# Patient Record
Sex: Female | Born: 1955 | Race: White | Hispanic: No | Marital: Married | Smoking: Former smoker
Health system: Southern US, Community
[De-identification: ages and names within clinical notes are randomized; demographics above are authoritative.]

## PROBLEM LIST (undated history)

## (undated) DIAGNOSIS — R112 Nausea with vomiting, unspecified: Secondary | ICD-10-CM

## (undated) DIAGNOSIS — J449 Chronic obstructive pulmonary disease, unspecified: Secondary | ICD-10-CM

## (undated) DIAGNOSIS — R7303 Prediabetes: Secondary | ICD-10-CM

## (undated) DIAGNOSIS — Z9889 Other specified postprocedural states: Secondary | ICD-10-CM

## (undated) DIAGNOSIS — R002 Palpitations: Secondary | ICD-10-CM

## (undated) DIAGNOSIS — K219 Gastro-esophageal reflux disease without esophagitis: Secondary | ICD-10-CM

## (undated) DIAGNOSIS — K760 Fatty (change of) liver, not elsewhere classified: Secondary | ICD-10-CM

## (undated) DIAGNOSIS — IMO0001 Reserved for inherently not codable concepts without codable children: Secondary | ICD-10-CM

## (undated) DIAGNOSIS — IMO0002 Reserved for concepts with insufficient information to code with codable children: Secondary | ICD-10-CM

## (undated) DIAGNOSIS — U071 COVID-19: Secondary | ICD-10-CM

## (undated) DIAGNOSIS — N390 Urinary tract infection, site not specified: Secondary | ICD-10-CM

## (undated) DIAGNOSIS — T7840XA Allergy, unspecified, initial encounter: Secondary | ICD-10-CM

## (undated) DIAGNOSIS — M170 Bilateral primary osteoarthritis of knee: Secondary | ICD-10-CM

## (undated) DIAGNOSIS — R0902 Hypoxemia: Secondary | ICD-10-CM

## (undated) DIAGNOSIS — R3129 Other microscopic hematuria: Secondary | ICD-10-CM

## (undated) DIAGNOSIS — M545 Low back pain, unspecified: Secondary | ICD-10-CM

## (undated) DIAGNOSIS — T8859XA Other complications of anesthesia, initial encounter: Secondary | ICD-10-CM

## (undated) DIAGNOSIS — K2970 Gastritis, unspecified, without bleeding: Secondary | ICD-10-CM

## (undated) DIAGNOSIS — R0789 Other chest pain: Secondary | ICD-10-CM

## (undated) DIAGNOSIS — F419 Anxiety disorder, unspecified: Secondary | ICD-10-CM

## (undated) DIAGNOSIS — R748 Abnormal levels of other serum enzymes: Secondary | ICD-10-CM

## (undated) DIAGNOSIS — F17201 Nicotine dependence, unspecified, in remission: Secondary | ICD-10-CM

## (undated) DIAGNOSIS — E119 Type 2 diabetes mellitus without complications: Secondary | ICD-10-CM

## (undated) DIAGNOSIS — E785 Hyperlipidemia, unspecified: Secondary | ICD-10-CM

## (undated) DIAGNOSIS — I10 Essential (primary) hypertension: Secondary | ICD-10-CM

## (undated) DIAGNOSIS — R195 Other fecal abnormalities: Secondary | ICD-10-CM

## (undated) DIAGNOSIS — G8929 Other chronic pain: Secondary | ICD-10-CM

## (undated) DIAGNOSIS — I2510 Atherosclerotic heart disease of native coronary artery without angina pectoris: Secondary | ICD-10-CM

## (undated) DIAGNOSIS — Z8719 Personal history of other diseases of the digestive system: Secondary | ICD-10-CM

## (undated) DIAGNOSIS — I350 Nonrheumatic aortic (valve) stenosis: Secondary | ICD-10-CM

## (undated) DIAGNOSIS — I4510 Unspecified right bundle-branch block: Secondary | ICD-10-CM

## (undated) HISTORY — DX: Hyperlipidemia, unspecified: E78.5

## (undated) HISTORY — PX: TONSILLECTOMY: SUR1361

## (undated) HISTORY — DX: Hypoxemia: R09.02

## (undated) HISTORY — DX: Allergy, unspecified, initial encounter: T78.40XA

## (undated) HISTORY — PX: OOPHORECTOMY: SHX86

## (undated) HISTORY — DX: Reserved for concepts with insufficient information to code with codable children: IMO0002

## (undated) HISTORY — DX: Essential (primary) hypertension: I10

## (undated) HISTORY — DX: Personal history of other diseases of the digestive system: Z87.19

## (undated) HISTORY — DX: Bilateral primary osteoarthritis of knee: M17.0

## (undated) HISTORY — DX: Abnormal levels of other serum enzymes: R74.8

## (undated) HISTORY — DX: Other chest pain: R07.89

## (undated) HISTORY — PX: SPINE SURGERY: SHX786

## (undated) HISTORY — DX: Other chronic pain: G89.29

## (undated) HISTORY — DX: Anxiety disorder, unspecified: F41.9

## (undated) HISTORY — DX: Urinary tract infection, site not specified: N39.0

## (undated) HISTORY — DX: Other fecal abnormalities: R19.5

## (undated) HISTORY — DX: Low back pain, unspecified: M54.50

## (undated) HISTORY — DX: Gastritis, unspecified, without bleeding: K29.70

## (undated) HISTORY — DX: Other microscopic hematuria: R31.29

## (undated) HISTORY — DX: Nicotine dependence, unspecified, in remission: F17.201

## (undated) HISTORY — DX: Prediabetes: R73.03

## (undated) HISTORY — DX: Reserved for inherently not codable concepts without codable children: IMO0001

## (undated) HISTORY — PX: TUBAL LIGATION: SHX77

## (undated) HISTORY — PX: KNEE ARTHROSCOPY: SUR90

## (undated) HISTORY — PX: TONSILLECTOMY: SHX5217

## (undated) HISTORY — DX: Low back pain: M54.5

## (undated) HISTORY — DX: Palpitations: R00.2

## (undated) HISTORY — DX: Type 2 diabetes mellitus without complications: E11.9

## (undated) HISTORY — DX: Fatty (change of) liver, not elsewhere classified: K76.0

## (undated) HISTORY — PX: JOINT REPLACEMENT: SHX530

---
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
## 1982-11-05 HISTORY — PX: APPENDECTOMY: SHX54

## 1991-11-06 HISTORY — PX: LUMBAR SPINE SURGERY: SHX701

## 1995-11-06 HISTORY — PX: ABDOMINAL HYSTERECTOMY: SHX81

## 2004-01-07 ENCOUNTER — Ambulatory Visit (HOSPITAL_COMMUNITY): Admission: RE | Admit: 2004-01-07 | Discharge: 2004-01-07 | Payer: Self-pay | Admitting: Preventative Medicine

## 2004-02-02 ENCOUNTER — Encounter (HOSPITAL_COMMUNITY): Admission: RE | Admit: 2004-02-02 | Discharge: 2004-03-03 | Payer: Self-pay | Admitting: Unknown Physician Specialty

## 2004-04-20 ENCOUNTER — Ambulatory Visit (HOSPITAL_COMMUNITY): Admission: RE | Admit: 2004-04-20 | Discharge: 2004-04-20 | Payer: Self-pay | Admitting: Preventative Medicine

## 2004-11-05 DIAGNOSIS — R002 Palpitations: Secondary | ICD-10-CM

## 2004-11-05 HISTORY — PX: CARDIOVASCULAR STRESS TEST: SHX262

## 2004-11-05 HISTORY — DX: Palpitations: R00.2

## 2004-11-05 HISTORY — PX: TRANSTHORACIC ECHOCARDIOGRAM: SHX275

## 2005-01-26 ENCOUNTER — Encounter: Payer: Self-pay | Admitting: Family Medicine

## 2005-05-25 ENCOUNTER — Encounter: Payer: Self-pay | Admitting: Family Medicine

## 2005-05-25 ENCOUNTER — Ambulatory Visit (HOSPITAL_COMMUNITY): Admission: RE | Admit: 2005-05-25 | Discharge: 2005-05-25 | Payer: Self-pay | Admitting: Family Medicine

## 2005-05-29 ENCOUNTER — Ambulatory Visit (HOSPITAL_COMMUNITY): Admission: RE | Admit: 2005-05-29 | Discharge: 2005-05-30 | Payer: Self-pay | Admitting: Family Medicine

## 2005-05-30 ENCOUNTER — Encounter: Payer: Self-pay | Admitting: Family Medicine

## 2005-05-31 ENCOUNTER — Ambulatory Visit: Payer: Self-pay | Admitting: Cardiology

## 2005-07-22 ENCOUNTER — Emergency Department (HOSPITAL_COMMUNITY): Admission: EM | Admit: 2005-07-22 | Discharge: 2005-07-22 | Payer: Self-pay | Admitting: Emergency Medicine

## 2005-10-09 ENCOUNTER — Encounter: Payer: Self-pay | Admitting: Family Medicine

## 2005-10-09 HISTORY — PX: CARDIAC CATHETERIZATION: SHX172

## 2006-03-28 ENCOUNTER — Encounter: Payer: Self-pay | Admitting: Family Medicine

## 2006-03-28 ENCOUNTER — Ambulatory Visit (HOSPITAL_COMMUNITY): Admission: RE | Admit: 2006-03-28 | Discharge: 2006-03-28 | Payer: Self-pay | Admitting: Family Medicine

## 2006-05-14 ENCOUNTER — Encounter: Payer: Self-pay | Admitting: Family Medicine

## 2006-05-20 ENCOUNTER — Encounter: Payer: Self-pay | Admitting: Family Medicine

## 2006-09-03 ENCOUNTER — Encounter: Payer: Self-pay | Admitting: Family Medicine

## 2006-09-03 ENCOUNTER — Ambulatory Visit (HOSPITAL_COMMUNITY): Admission: RE | Admit: 2006-09-03 | Discharge: 2006-09-03 | Payer: Self-pay | Admitting: Urology

## 2006-09-30 ENCOUNTER — Encounter: Payer: Self-pay | Admitting: Family Medicine

## 2007-03-05 ENCOUNTER — Ambulatory Visit (HOSPITAL_COMMUNITY): Admission: RE | Admit: 2007-03-05 | Discharge: 2007-03-05 | Payer: Self-pay | Admitting: General Surgery

## 2007-03-05 ENCOUNTER — Encounter (INDEPENDENT_AMBULATORY_CARE_PROVIDER_SITE_OTHER): Payer: Self-pay | Admitting: Specialist

## 2008-09-09 ENCOUNTER — Ambulatory Visit (HOSPITAL_COMMUNITY): Admission: RE | Admit: 2008-09-09 | Discharge: 2008-09-09 | Payer: Self-pay | Admitting: Family Medicine

## 2008-09-15 ENCOUNTER — Ambulatory Visit (HOSPITAL_COMMUNITY): Admission: RE | Admit: 2008-09-15 | Discharge: 2008-09-15 | Payer: Self-pay | Admitting: Family Medicine

## 2008-09-15 ENCOUNTER — Encounter: Payer: Self-pay | Admitting: Family Medicine

## 2008-11-05 HISTORY — PX: OTHER SURGICAL HISTORY: SHX169

## 2009-06-02 ENCOUNTER — Emergency Department (HOSPITAL_COMMUNITY): Admission: EM | Admit: 2009-06-02 | Discharge: 2009-06-02 | Payer: Self-pay | Admitting: Emergency Medicine

## 2009-06-07 ENCOUNTER — Ambulatory Visit (HOSPITAL_COMMUNITY): Admission: RE | Admit: 2009-06-07 | Discharge: 2009-06-07 | Payer: Self-pay | Admitting: Family Medicine

## 2009-08-10 ENCOUNTER — Encounter (INDEPENDENT_AMBULATORY_CARE_PROVIDER_SITE_OTHER): Payer: Self-pay | Admitting: General Surgery

## 2009-08-10 ENCOUNTER — Ambulatory Visit (HOSPITAL_COMMUNITY): Admission: RE | Admit: 2009-08-10 | Discharge: 2009-08-10 | Payer: Self-pay | Admitting: General Surgery

## 2009-10-04 ENCOUNTER — Encounter: Payer: Self-pay | Admitting: Family Medicine

## 2009-10-13 ENCOUNTER — Encounter: Payer: Self-pay | Admitting: Family Medicine

## 2009-11-14 ENCOUNTER — Encounter: Payer: Self-pay | Admitting: Family Medicine

## 2009-11-25 ENCOUNTER — Encounter: Payer: Self-pay | Admitting: Family Medicine

## 2010-10-23 ENCOUNTER — Encounter: Payer: Self-pay | Admitting: Family Medicine

## 2010-11-09 ENCOUNTER — Encounter: Payer: Self-pay | Admitting: Family Medicine

## 2010-11-09 ENCOUNTER — Ambulatory Visit
Admission: RE | Admit: 2010-11-09 | Discharge: 2010-11-09 | Payer: Self-pay | Source: Home / Self Care | Attending: Family Medicine | Admitting: Family Medicine

## 2010-11-09 ENCOUNTER — Telehealth: Payer: Self-pay | Admitting: Family Medicine

## 2010-11-09 DIAGNOSIS — F17200 Nicotine dependence, unspecified, uncomplicated: Secondary | ICD-10-CM | POA: Insufficient documentation

## 2010-11-09 DIAGNOSIS — R7303 Prediabetes: Secondary | ICD-10-CM | POA: Insufficient documentation

## 2010-11-09 DIAGNOSIS — E785 Hyperlipidemia, unspecified: Secondary | ICD-10-CM | POA: Insufficient documentation

## 2010-11-09 DIAGNOSIS — Z87448 Personal history of other diseases of urinary system: Secondary | ICD-10-CM | POA: Insufficient documentation

## 2010-11-09 DIAGNOSIS — M545 Low back pain: Secondary | ICD-10-CM | POA: Insufficient documentation

## 2010-11-10 ENCOUNTER — Encounter: Payer: Self-pay | Admitting: Family Medicine

## 2010-11-14 ENCOUNTER — Encounter: Payer: Self-pay | Admitting: Family Medicine

## 2010-12-07 NOTE — Miscellaneous (Signed)
  Clinical Lists Changes  Medications: Added new medication of CYCLOBENZAPRINE HCL 10 MG TABS (CYCLOBENZAPRINE HCL) 1 tab by mouth q8h as needed for muscle spasms - Signed Rx of CYCLOBENZAPRINE HCL 10 MG TABS (CYCLOBENZAPRINE HCL) 1 tab by mouth q8h as needed for muscle spasms;  #30 x 3;  Signed;  Entered by: Michell Heinrich M.D.;  Authorized by: Michell Heinrich M.D.;  Method used: Electronically to [facility] [address] [place], [place], [place]  [address], Ph: [phone], Fax: [phone]    Prescriptions: CYCLOBENZAPRINE HCL 10 MG TABS (CYCLOBENZAPRINE HCL) 1 tab by mouth q8h as needed for muscle spasms  #30 x 3   Entered and Authorized by:   Michell Heinrich M.D.   Signed by:   Michell Heinrich M.D. on 11/09/2010   Method used:   Electronically to        [facility] (retail)       [address]       [place], [place]  [address]       Ph: [phone]       Fax: [phone]   RxID:   [id number]

## 2010-12-07 NOTE — Letter (Signed)
[facility]   Imported By: Lester [place] 11/15/2010 07:08:18  _____________________________________________________________________  External Attachment:    Type:   Image     Comment:   External Document

## 2010-12-07 NOTE — Letter (Signed)
Summary: Jill Side MD  T Casandra Doffing MD   Imported By: Lester [place] 11/15/2010 09:30:11  _____________________________________________________________________  External Attachment:    Type:   Image     Comment:   External Document

## 2010-12-07 NOTE — Cardiovascular Report (Signed)
Summary: [facility]  [facility]   Imported By: Lanelle Bal 11/20/2010 12:30:32  _____________________________________________________________________  External Attachment:    Type:   Image     Comment:   External Document

## 2010-12-07 NOTE — Assessment & Plan Note (Signed)
Summary: cpx/refill of meds./vfw   Vital Signs:  Patient profile:   55 year old female Menstrual status:  hysterectomy Height:      65 inches Weight:      169.75 pounds BMI:     28.35 O2 Sat:      96 % on Room air Pulse rate:   69 / minute BP sitting:   116 / 79  (right arm) Cuff size:   regular  Vitals Entered By: Francee Piccolo CMA Duncan Dull) (November 09, 2010 8:32 AM)  O2 Flow:  Room air CC: Establish care/refill meds//SP     Menstrual Status hysterectomy   History of Present Illness: 55 y/o WF familiar to me from my prior practice in [place], [place]--first visit here with me at [facility]. Doing okay overall, had another L-spine surgery 02/2010 for spinal stenosis and this resolved her leg pains.  She had stabilization procedure on L spine after this, still has chronic LBP, pain level 4-8/10 avg, consistent with the pain level that she had been controlling well with about 3 vicodin 5/500 per day prior to surgery.  Has been out of pain meds x 3d. Has been out of all her other meds x 3 mo or so b/c of lack of primary care f/u. Has diet controlled DM 2, once daily-bid glucose checks are good (fasting avg 100, 2HPP avg 140), she notes better numbers since having to adjust diet and activity/rehab after surgery 02/2010.  Last mammogram was > 1 yr ago, has never had abnormal mammo. Has not had screening colonoscopy yet. No flu shot yet this season. Still smoking a couple of cigarettes per day most days.  Reviewed med list below: pt NOT taking citalopram or ventolin anymore.  Otherwise the list is accurate.   Preventive Screening-Counseling & Management  Alcohol-Tobacco     Alcohol drinks/day: 0     Smoking Status: current  Caffeine-Diet-Exercise     Does Patient Exercise: yes      Drug Use:  no.    Current Medications (verified): 1)  Ventolin Hfa 108 (90 Base) Mcg/act Aers (Albuterol Sulfate) .Marland Kitchen.. 1 Puff Every 4 Hours As Needed 2)  Fenofibrate 160 Mg Tabs (Fenofibrate) ....  Take 1 Tablet By Mouth Once A Day 3)  Citalopram Hydrobromide 40 Mg Tabs (Citalopram Hydrobromide) .... Take 1 Tablet By Mouth Once A Day 4)  Nexium 40 Mg Cpdr (Esomeprazole Magnesium) .... Take 1 Capsule By Mouth Once A Day 5)  Hydrocodone-Acetaminophen 5-500 Mg Tabs (Hydrocodone-Acetaminophen) .... Take 1 Tab By Mouth Every 4-6 Hours As Needed Pain 6)  Lescol Xl 80 Mg Xr24h-Tab (Fluvastatin Sodium) .... Take 1 Tablet By Mouth Once A Day  Allergies (verified): 1)  ! Asa 2)  ! Compazine 3)  ! Pcn 4)  ! Amitriptyline Hcl 5)  Beta Blockers 6)  Nsaids 7)  Sulfa  Past History:  Past Medical History: Chronic LBP DDD/spinal stenosis Hyperlipidemia-mixed Recurrent UTI Microscopic hematuria Tobacco dependence Atypical CP 2007: cath normal  Past Surgical History: Lumbar spine surgery 2003, 2005, 2011 (stabilization done)--Dr. Derian Hysterectomy 1997 (DUB) Right oopherectomy-cyst Tonsillectomy -distant past  Family History: No malignancy, CAD, or DM.  Social History: Married. Not working currently. Tobacco: avg 3 cigs/day, history of much more x years. Alcohol use-no Drug use-no Regular exercise-yes, as allowed by low back pain. Smoking Status:  current Drug Use:  no Does Patient Exercise:  yes  Review of Systems  The patient denies anorexia, fever, weight loss, weight gain, vision loss, decreased hearing, hoarseness,  chest pain, syncope, dyspnea on exertion, peripheral edema, prolonged cough, headaches, hemoptysis, abdominal pain, melena, hematochezia, severe indigestion/heartburn, hematuria, incontinence, genital sores, muscle weakness, suspicious skin lesions, transient blindness, difficulty walking, depression, unusual weight change, abnormal bleeding, enlarged lymph nodes, angioedema, and breast masses.    Physical Exam  General:  VS: noted, all normal. Gen: Alert, well appearing, oriented x 4.  Wearing a firm TLSO. HEENT: Scalp without lesions or hair loss.  Ears:  EACs clear, normal epithelium.  TMs with good light reflex and landmarks bilaterally.  Eyes: no injection, icteris, swelling, or exudate.  EOMI, PERRLA. Nose: no drainage or turbinate edema/swelling.  No inection or focal lesion.  Mouth: lips without lesion/swelling.  Oral mucosa pink and moist.  Dentition intact and without obvious caries or gingival swelling.  Oropharynx without erythema, exudate, or swelling.  Neck: supple.  No lymphadenopathy, thyromegaly, or mass. Chest: symmetric expansion, with nonlabored respirations.  Clear and equal breath sounds in all lung fields.   CV: RRR, no m/r/g.  Peripheral pulses 2+/symmetric. EXT: no clubbing, cyanosis, or edema.     Impression & Recommendations:  Problem # 1:  BACK PAIN, LUMBAR, CHRONIC (ICD-724.2) Assessment Improved Will resume her prior pain mgmt regimen.  She'll continue f/u with her surgeon in [place], Dr. Clotilde Dieter. After brace is off and things are stable for her, we'll pursue her screening colonoscopy referral and screening mammogram. I encouraged her to get her flu vaccine ASAP.  Her updated medication list for this problem includes:    Hydrocodone-acetaminophen 5-500 Mg Tabs (Hydrocodone-acetaminophen) .Marland Kitchen... Take 1 tab by mouth every 4-6 hours as needed pain  Problem # 2:  DIAB W/O COMP TYPE II/UNS NOT STATED UNCNTRL (ICD-250.00) Assessment: Improved Good control per home gluc monitoring.  Continue diet/exercise and once daily to once every other day glucose checks. We'll check urine microalbumin screen next f/u in 6mo.  Will obtain old records to see if D.R. screening is due (I suspect it is) and we'll set this up as necessary.  Orders: T-Lipid Profile [id number]) T-Comprehensive Metabolic Panel [id number]) T- Hemoglobin A1C ([id number])  Problem # 3:  HYPERLIPIDEMIA (ICD-272.4) Assessment: Unchanged Restart meds, check blood work.  Her updated medication list for this problem includes:    Fenofibrate 160 Mg Tabs  (Fenofibrate) .Marland Kitchen... Take 1 tablet by mouth once a day    Lescol Xl 80 Mg Xr24h-tab (Fluvastatin sodium) .Marland Kitchen... Take 1 tablet by mouth once a day  Orders: T-Lipid Profile ([id number]) T-Comprehensive Metabolic Panel [id number]) T- Hemoglobin A1C ([id number])  Problem # 4:  HEMATURIA, MICROSCOPIC, HX OF (ICD-V13.09) Assessment: Unchanged Obtain former urologist's records....she desires referral to different urologist for f/u of this problem. W/u in the past, including cystoscopy and u/s were negative/normal.   It seems her recurrent UTI problem is doing fine off of prophylactic abx as well. We'll check her urine here periodically----new urology referral if she continues to have microhematuria.  Problem # 5:  TOBACCO ABUSE (ICD-305.1) Assessment: Improved Encouraged complete cessation.  Complete Medication List: 1)  Fenofibrate 160 Mg Tabs (Fenofibrate) .... Take 1 tablet by mouth once a day 2)  Nexium 40 Mg Cpdr (Esomeprazole magnesium) .... Take 1 capsule by mouth once a day 3)  Hydrocodone-acetaminophen 5-500 Mg Tabs (Hydrocodone-acetaminophen) .... Take 1 tab by mouth every 4-6 hours as needed pain 4)  Lescol Xl 80 Mg Xr24h-tab (Fluvastatin sodium) .... Take 1 tablet by mouth once a day  Patient Instructions: 1)  Sign release of records authorization (need records from Dr. Clotilde Dieter in  [place] and Dr. Laurance Flatten at [facility]. 2)  Get your flu shot ASAP. 3)  Take your orders for lab work ASAP. 4)  Make f/u appt for 3 months. Prescriptions: HYDROCODONE-ACETAMINOPHEN 5-500 MG TABS (HYDROCODONE-ACETAMINOPHEN) take 1 tab by mouth every 4-6 hours as needed pain  #90 x 3   Entered and Authorized by:   Michell Heinrich M.D.   Signed by:   Michell Heinrich M.D. on 11/09/2010   Method used:   Print then Give to Patient   RxID:   [id number] LESCOL XL 80 MG XR24H-TAB (FLUVASTATIN SODIUM) Take 1 tablet by mouth once a day  #30 x 6   Entered and Authorized by:    Michell Heinrich M.D.   Signed by:   Michell Heinrich M.D. on 11/09/2010   Method used:   Electronically to        [facility] (retail)       [address]       [place], [place]  [address]       Ph: [phone]       Fax: [phone]   RxID:   [id number] NEXIUM 40 MG CPDR (ESOMEPRAZOLE MAGNESIUM) Take 1 capsule by mouth once a day  #30 x 6   Entered and Authorized by:   Michell Heinrich M.D.   Signed by:   Michell Heinrich M.D. on 11/09/2010   Method used:   Electronically to        [facility] (retail)       [address]       [place], [place]  [address]       Ph: [phone]       Fax: [phone]   RxID:   [id number] FENOFIBRATE 160 MG TABS (FENOFIBRATE) Take 1 tablet by mouth once a day  #30 x 6   Entered and Authorized by:   Michell Heinrich M.D.   Signed by:   Michell Heinrich M.D. on 11/09/2010   Method used:   Electronically to        [facility] (retail)       [address]       [place], [place]  [address]       Ph: [phone]       Fax: [phone]   RxID:   [id number]    Orders Added: 1)  T-Lipid Profile [id number] 2)  T-Comprehensive Metabolic Panel [[id number]] 3)  T- Hemoglobin A1C [[id number]] 4)  Est. Patient Level IV [00938]  Appended Document: cpx/refill of meds./vfw Forgot to rx pt muscle relaxer for her low back pain/muscle spasms ---I sent eRx for cyclobenzaprine 10mg , 1 tab q8h as needed, #30, RFx 3 and we'll notify pt.

## 2010-12-07 NOTE — Progress Notes (Signed)
  Phone Note Other Incoming   Summary of Call: Please call and notify pt that I forgot to rx her the muscle relaxer we discussed at today's visit.  I just sent eRx for it now.  Thanks Initial call taken by: Michell Heinrich M.D.,  November 09, 2010 2:33 PM  Follow-up for Phone Call        I spoke to pt and notified her that med was at pharmacy.  Pt states she recieved flu shot yesterday at pharmacy and had temp of 104 last night.  Pt had no other associated symptoms, and has not had any fever since 7:30 am today.  Advised pt to monitor temp and if any other symptoms occur to call our office.  Pt voices understanding and is agreeable. Follow-up by: Francee Piccolo CMA Duncan Dull),  November 10, 2010 11:29 AM

## 2010-12-07 NOTE — Letter (Signed)
Summary: Jill Side MD  T Casandra Doffing MD   Imported By: Lester [place] 11/15/2010 09:29:12  _____________________________________________________________________  External Attachment:    Type:   Image     Comment:   External Document

## 2010-12-07 NOTE — Letter (Signed)
Summary: Notes for 06-08-09 thru 10-04-09/[facility]  Notes for 06-08-09 thru 10-04-09/[facility]   Imported By: Lanelle Bal 11/20/2010 12:24:56  _____________________________________________________________________  External Attachment:    Type:   Image     Comment:   External Document

## 2010-12-07 NOTE — Miscellaneous (Signed)
  Clinical Lists Changes  Observations: Added new observation of PAST SURG HX: Lumbar spine surgery 1993 (right iliac crest bone graft+metal instrumentation); 2005 (metal removal and decompression); 2011 (lumbar decompression 02/2010, then stabilization/instrumentation done 09/19/10: L2, L3, L5 left and  L2, L3, L4 right--pedical remnant L4 left embedded.  Left iliac crest bone graft)--Dr. Clotilde Dieter) Appendectomy 1984 Hysterectomy 1997 (DUB) Right oopherectomy-cyst Tonsillectomy age 13t Knee arthroscopy (right and left) Left wrist ganglion cyst excision 2010 (11/14/2010 13:51) Added new observation of PAST MED HX: Chronic LBP DDD/spinal stenosis Hyperlipidemia-mixed Remote hx of GI bleed (NSAIDs) Recurrent UTI Microscopic hematuria (full w/u unrevealing x 2) Tobacco dependence, with only occasional bronchitis illness (CT chest 2009 nl--for f/u of ?nodule on CXR). Atypical CP 2007: cardiolyte neg, echo nl, cath showed mild/nonobstructive LAD disease. Nuclear stress test negative, EF normal--preOp 09/2010 Palpitations: 48H Holter neg 2006 (11/14/2010 13:51)       Past History:  Past Medical History: Chronic LBP DDD/spinal stenosis Hyperlipidemia-mixed Remote hx of GI bleed (NSAIDs) Recurrent UTI Microscopic hematuria (full w/u unrevealing x 2) Tobacco dependence, with only occasional bronchitis illness (CT chest 2009 nl--for f/u of ?nodule on CXR). Atypical CP 2007: cardiolyte neg, echo nl, cath showed mild/nonobstructive LAD disease. Nuclear stress test negative, EF normal--preOp 09/2010 Palpitations: 48H Holter neg 2006  Past Surgical History: Lumbar spine surgery 1993 (right iliac crest bone graft+metal instrumentation); 2005 (metal removal and decompression); 2011 (lumbar decompression 02/2010, then stabilization/instrumentation done 09/19/10: L2, L3, L5 left and  L2, L3, L4 right--pedical remnant L4 left embedded.  Left iliac crest bone graft)--Dr. Clotilde Dieter) Appendectomy  1984 Hysterectomy 1997 (DUB) Right oopherectomy-cyst Tonsillectomy age 70t Knee arthroscopy (right and left) Left wrist ganglion cyst excision 2010   Allergies: 1)  ! Asa 2)  ! Compazine 3)  ! Pcn 4)  ! Amitriptyline Hcl 5)  Beta Blockers 6)  Nsaids 7)  Sulfa

## 2010-12-07 NOTE — Letter (Signed)
[facility]   Imported By: Lester [place] 11/15/2010 07:07:20  _____________________________________________________________________  External Attachment:    Type:   Image     Comment:   External Document

## 2010-12-07 NOTE — Letter (Signed)
Summary: [facility]   Imported By: Lanelle Bal 11/20/2010 12:14:54  _____________________________________________________________________  External Attachment:    Type:   Image     Comment:   External Document

## 2010-12-07 NOTE — Letter (Signed)
Summary: [facility]  [facility]   Imported By: Lanelle Bal 11/20/2010 12:31:34  _____________________________________________________________________  External Attachment:    Type:   Image     Comment:   External Document

## 2010-12-07 NOTE — Letter (Signed)
Summary: [facility]   Imported By: Lester [place] 11/15/2010 07:09:23  _____________________________________________________________________  External Attachment:    Type:   Image     Comment:   External Document

## 2010-12-07 NOTE — Miscellaneous (Signed)
  Clinical Lists Changes  Observations: Added new observation of SOCIAL HX: Married, two children. Disabled (back).  Worked for [employer] prior to disability. Tobacco: avg 3 cigs/day, history of much more x 35+ years. Alcohol use-no Drug use-no Regular exercise-walking, as allowed by low back pain.  (11/10/2010 13:41) Added new observation of PAST MED HX: Chronic LBP DDD/spinal stenosis Hyperlipidemia-mixed Remote hx of GI bleed (NSAIDs) Recurrent UTI Microscopic hematuria (w/u unrevealing) Tobacco dependence, with only occasional bronchitis illness. Atypical CP 2007: cath normal. Nuclear stress test negative, EF normal--preOp 09/2010 (11/10/2010 13:41) Added new observation of PAST SURG HX: Lumbar spine surgery 1993 (right iliac crest bone graft+metal instrumentation); 2005 (metal removal and decompression); 2011 (lumbar decompression 02/2010, then stabilization/instrumentation done 09/19/10: L2, L3, L5 left and  L2, L3, L4 right--pedical remnant L4 left embedded.  Left iliac crest bone graft)--Dr. Clotilde Dieter) Appendectomy 1984 Hysterectomy 1997 (DUB) Right oopherectomy-cyst Tonsillectomy age 26t Knee arthroscopy (right and left) (11/10/2010 13:41)       Past History:  Past Medical History: Chronic LBP DDD/spinal stenosis Hyperlipidemia-mixed Remote hx of GI bleed (NSAIDs) Recurrent UTI Microscopic hematuria (w/u unrevealing) Tobacco dependence, with only occasional bronchitis illness. Atypical CP 2007: cath normal. Nuclear stress test negative, EF normal--preOp 09/2010  Past Surgical History: Lumbar spine surgery 1993 (right iliac crest bone graft+metal instrumentation); 2005 (metal removal and decompression); 2011 (lumbar decompression 02/2010, then stabilization/instrumentation done 09/19/10: L2, L3, L5 left and  L2, L3, L4 right--pedical remnant L4 left embedded.  Left iliac crest bone graft)--Dr. Clotilde Dieter) Appendectomy 1984 Hysterectomy 1997 (DUB) Right  oopherectomy-cyst Tonsillectomy age 74t Knee arthroscopy (right and left)   Social History: Married, two children. Disabled (back).  Worked for [employer] prior to disability. Tobacco: avg 3 cigs/day, history of much more x 35+ years. Alcohol use-no Drug use-no Regular exercise-walking, as allowed by low back pain.   Allergies: 1)  ! Asa 2)  ! Compazine 3)  ! Pcn 4)  ! Amitriptyline Hcl 5)  Beta Blockers 6)  Nsaids 7)  Sulfa

## 2010-12-07 NOTE — Procedures (Signed)
Summary: [facility]  [facility]   Imported By: Lanelle Bal 11/20/2010 12:26:39  _____________________________________________________________________  External Attachment:    Type:   Image     Comment:   External Document

## 2010-12-07 NOTE — Letter (Signed)
Summary: [facility]   Imported By: Lanelle Bal 11/20/2010 12:16:09  _____________________________________________________________________  External Attachment:    Type:   Image     Comment:   External Document

## 2011-01-11 ENCOUNTER — Encounter: Payer: Self-pay | Admitting: Family Medicine

## 2011-01-28 ENCOUNTER — Encounter: Payer: Self-pay | Admitting: Family Medicine

## 2011-02-08 ENCOUNTER — Ambulatory Visit (INDEPENDENT_AMBULATORY_CARE_PROVIDER_SITE_OTHER): Payer: Medicare Other | Admitting: Family Medicine

## 2011-02-08 ENCOUNTER — Encounter: Payer: Self-pay | Admitting: Family Medicine

## 2011-02-08 DIAGNOSIS — E785 Hyperlipidemia, unspecified: Secondary | ICD-10-CM

## 2011-02-08 DIAGNOSIS — F17200 Nicotine dependence, unspecified, uncomplicated: Secondary | ICD-10-CM

## 2011-02-08 DIAGNOSIS — E119 Type 2 diabetes mellitus without complications: Secondary | ICD-10-CM

## 2011-02-08 DIAGNOSIS — M545 Low back pain: Secondary | ICD-10-CM

## 2011-02-08 LAB — DIFFERENTIAL
Eosinophils Relative: 3 % (ref 0–5)
Lymphocytes Relative: 37 % (ref 12–46)
Lymphs Abs: 2.7 10*3/uL (ref 0.7–4.0)
Monocytes Absolute: 0.5 10*3/uL (ref 0.1–1.0)
Monocytes Relative: 7 % (ref 3–12)

## 2011-02-08 LAB — BASIC METABOLIC PANEL
Chloride: 105 mEq/L (ref 96–112)
GFR calc non Af Amer: >60 mL/min (ref >60)
Potassium: 4.2 mEq/L (ref 3.5–5.1)
Sodium: 142 mEq/L (ref 135–145)

## 2011-02-08 LAB — CBC
HCT: 38.3 % (ref 36.0–46.0)
Hemoglobin: 13.3 g/dL (ref 12.0–15.0)
RBC: 4.13 MIL/uL (ref 3.87–5.11)
WBC: 7.3 10*3/uL (ref 4.0–10.5)

## 2011-02-08 NOTE — Assessment & Plan Note (Signed)
Keep f/u with orthopedist on 02/19/11.  No rx's given today.  She'll continue her current use of vicodin prn to remain functional.

## 2011-02-08 NOTE — Assessment & Plan Note (Signed)
Stable but not quite at goal.  Labs 11/10/10 showed LDL 113, HDL 35, and Trig 161. She is maxed out on lescol XL and fenofibrate.  We discussed possible addition of niacin or lovaza today but she shied away quickly when potential side effects were discussed. I encouraged her to get extremely strict with a low chol/low fat diet and hopefully she'll increase her activity level significantly when her back brace comes off. We'll recheck lipids at f/u in 30mo.

## 2011-02-08 NOTE — Progress Notes (Signed)
Subjective:    Patient ID: Brenda [name], female    DOB: Oct 11, 1956, 55 y.o.   MRN: [id number]  Diabetes She presents for her follow-up diabetic visit. She has type 2 diabetes mellitus. Her disease course has been stable. There are no hypoglycemic associated symptoms. Pertinent negatives for hypoglycemia include no headaches. There are no diabetic associated symptoms. Pertinent negatives for diabetes include no chest pain, no fatigue and no weakness. There are no diabetic complications. Current diabetic treatment includes diet. She is compliant with treatment all of the time. Her weight is stable. She is following a diabetic diet. She monitors blood glucose at home 1-2 x per day. Blood glucose monitoring compliance is excellent. There is no change in her home blood glucose trend. Her breakfast blood glucose is taken between 8-9 am. Her breakfast blood glucose range is generally 90-110 mg/dl.  Hyperlipidemia This is a chronic problem. The current episode started more than 1 year ago. Condition status: gradually improving but not at goal. Lipid results: Jan 2012 : improved. Exacerbating diseases include diabetes. Factors aggravating her hyperlipidemia include smoking. Pertinent negatives include no chest pain. Current antihyperlipidemic treatment includes fibric acid derivatives and statins. The current treatment provides moderate improvement of lipids. Compliance problems include adherence to exercise (b/c of chronic back pain and recent surgery and brace).   Unfortunately, she is still smoking but continues to gradually cut back. She is still in her lumbar support brace during daytime hours and has f/u with her orthopedist, Dr. Clotilde Dieter, 02/19/11 and hopes to get her brace off at that time.  She is still requiring the same amounts of vicodin--no recent increase or decrease in need. She knows she's due for urine microalbumin check, diabetic retinopathy screening, mammogram, and colon cancer screening  (colonoscopy), but says she wants to put these things off until her back pain issues are resolved--hopefully by the next f/u visit with me in 35mo.  Past Medical History  Diagnosis Date  . Chronic LBP   . DDD (degenerative disc disease)     spinal stenosis  . Hyperlipidemia     mixed  . History of GI bleed     NSAIDS  . Recurrent UTI   . Microscopic hematuria     full w/ u unrevealing X 2  . Tobacco dependence     w/ only occasional bronchitis illness (CT shest 2009 nl- for f/u of ? nodule on CXR)  . Atypical chest pain 2007    cardiolyte neg, echo nl, cath showed mild/nonobstructive LAD disease  . Normal nuclear stress test 11/11    negative, EF normal preop  . Palpitations 2006    48H holter neg   Past Surgical History  Procedure Date  . Lumbar spine surgery 1993    right iliac crest bone graft+metal instrumentation; 2005 metal removal and decompression, 2011 lumbar decompression 4-11, then stabilization/ instrumentation done 09-19-10: L2,L3, L5 left and L2 , L3, L4 right pedical remnant L4 left embedded. Left iliac crest bone graft-- Dr Clotilde Dieter  . Appendectomy 1984  . Abdominal hysterectomy 1997    DUB  . Right oopherectomy     cyst  . Tonsillectomy 55 yrs old  . Knee arthroscopy     right and left  . Left wrist ganglion cyst excision 2010   Outpatient Encounter Prescriptions as of 02/08/2011  Medication Sig Dispense Refill  . cyclobenzaprine (FLEXERIL) 10 MG tablet Take 10 mg by mouth every 8 (eight) hours as needed. For muscle spasms       .  esomeprazole (NEXIUM) 40 MG capsule Take 40 mg by mouth daily.        . fenofibrate 160 MG tablet Take 160 mg by mouth daily.        . fluvastatin XL (LESCOL XL) 80 MG 24 hr tablet Take 80 mg by mouth daily.        Marland Kitchen HYDROcodone-acetaminophen (VICODIN) 5-500 MG per tablet Take 1 tablet by mouth every 6 (six) hours as needed. 1 tab po q 4-6 hours prn pain        Allergies  Allergen Reactions  . Amitriptyline Hcl     REACTION:  increased heart rate  . Aspirin     REACTION: GI Bleed  . Beta Adrenergic Blockers     REACTION: decreased libido  . Nsaids     REACTION: GI Upset  . Penicillins     REACTION: swelling  . Prochlorperazine Edisylate     REACTION: Stroke like symptoms  . Sulfonamide Derivatives     REACTION: unsure rxn      Review of Systems  Constitutional: Negative for fever and fatigue.  HENT: Negative for congestion and sore throat.   Eyes: Negative for visual disturbance.  Respiratory: Negative for cough.   Cardiovascular: Negative for chest pain.  Gastrointestinal: Negative for nausea and abdominal pain.  Genitourinary: Negative for dysuria.  Musculoskeletal: Positive for back pain. Negative for joint swelling.  Skin: Negative for rash.  Neurological: Negative for weakness and headaches.  Hematological: Negative for adenopathy.       Objective:   Physical Exam Blood pressure 131/81, pulse 83, temperature 98.2 F (36.8 C), temperature source Oral, height 5\' 5"  (1.651 m), weight 172 lb 12.8 oz (78.382 kg), SpO2 93.00%. VS: noted, all normal. Gen: Alert, well appearing, oriented x 4. No further exam today.       Assessment & Plan:  DIAB W/O COMP TYPE II/UNS NOT STATED UNCNTRL Stable.  Last HbA1c was 5.4% 11/2010. Continue diabetic diet, increase exercise as her back problem resolves. Next visit she'll need routine urine microalbumin screen and referral to ophtho for D.R. Screening. Will do foot exam at next f/u as well.  HYPERLIPIDEMIA Stable but not quite at goal.  Labs 11/10/10 showed LDL 113, HDL 35, and Trig 161. She is maxed out on lescol XL and fenofibrate.  We discussed possible addition of niacin or lovaza today but she shied away quickly when potential side effects were discussed. I encouraged her to get extremely strict with a low chol/low fat diet and hopefully she'll increase her activity level significantly when her back brace comes off. We'll recheck lipids at f/u in  1mo.  TOBACCO ABUSE Encouraged cessation today.  BACK PAIN, LUMBAR, CHRONIC Keep f/u with orthopedist on 02/19/11.  No rx's given today.  She'll continue her current use of vicodin prn to remain functional.

## 2011-02-08 NOTE — Assessment & Plan Note (Signed)
Stable.  Last HbA1c was 5.4% 11/2010. Continue diabetic diet, increase exercise as her back problem resolves. Next visit she'll need routine urine microalbumin screen and referral to ophtho for D.R. Screening. Will do foot exam at next f/u as well.

## 2011-02-08 NOTE — Assessment & Plan Note (Signed)
Encouraged cessation today  

## 2011-02-11 LAB — COMPREHENSIVE METABOLIC PANEL
ALT: 20 U/L (ref 0–35)
AST: 21 U/L (ref 0–37)
Albumin: 3.6 g/dL (ref 3.5–5.2)
Alkaline Phosphatase: 56 U/L (ref 39–117)
BUN: 11 mg/dL (ref 6–23)
CO2: 26 mEq/L (ref 19–32)
Calcium: 8.8 mg/dL (ref 8.4–10.5)
Chloride: 112 mEq/L (ref 96–112)
Creatinine, Ser: 0.61 mg/dL (ref 0.4–1.2)
GFR calc Af Amer: >60 mL/min (ref >60)
GFR calc non Af Amer: >60 mL/min (ref >60)
Glucose, Bld: 115 mg/dL — ABNORMAL HIGH (ref 70–99)
Potassium: 3.7 mEq/L (ref 3.5–5.1)
Sodium: 143 mEq/L (ref 135–145)
Total Bilirubin: 0.2 mg/dL — ABNORMAL LOW (ref 0.3–1.2)
Total Protein: 6.3 g/dL (ref 6.0–8.3)

## 2011-02-11 LAB — DIFFERENTIAL
Basophils Absolute: 0 10*3/uL (ref 0.0–0.1)
Eosinophils Absolute: 0.1 10*3/uL (ref 0.0–0.7)
Lymphocytes Relative: 28 % (ref 12–46)
Lymphs Abs: 2.5 10*3/uL (ref 0.7–4.0)
Neutrophils Relative %: 64 % (ref 43–77)

## 2011-02-11 LAB — POCT CARDIAC MARKERS
Myoglobin, poc: 55.7 ng/mL (ref 12–200)
Troponin i, poc: <0.05 ng/mL (ref 0.00–0.09)

## 2011-02-11 LAB — URINALYSIS, ROUTINE W REFLEX MICROSCOPIC
Bilirubin Urine: NEGATIVE
Glucose, UA: NEGATIVE mg/dL
Ketones, ur: NEGATIVE mg/dL
Protein, ur: NEGATIVE mg/dL

## 2011-02-11 LAB — CBC
HCT: 36.5 % (ref 36.0–46.0)
Hemoglobin: 12.9 g/dL (ref 12.0–15.0)
MCHC: 35.3 g/dL (ref 30.0–36.0)
MCV: 92 fL (ref 78.0–100.0)
RBC: 3.97 MIL/uL (ref 3.87–5.11)

## 2011-02-20 ENCOUNTER — Encounter: Payer: Self-pay | Admitting: Family Medicine

## 2011-02-27 ENCOUNTER — Other Ambulatory Visit: Payer: Self-pay | Admitting: *Deleted

## 2011-02-27 DIAGNOSIS — M545 Low back pain: Secondary | ICD-10-CM

## 2011-02-27 MED ORDER — HYDROCODONE-ACETAMINOPHEN 5-500 MG PO TABS: 1.0000 | ORAL_TABLET | Freq: Four times a day (QID) | ORAL | Status: DC | PRN

## 2011-02-27 MED ORDER — CYCLOBENZAPRINE HCL 10 MG PO TABS: 10.0000 mg | ORAL_TABLET | Freq: Three times a day (TID) | ORAL | Status: DC | PRN

## 2011-02-27 NOTE — Telephone Encounter (Signed)
May RF both meds as previously prescribed x 4.  Pt should f/u in about 3-4 mo.  Thx

## 2011-02-27 NOTE — Telephone Encounter (Signed)
Pt left a message stating she needs refills on meds.  RC to pt.  Pt is completely out of Flexeril and Vicodin.  Pt states the pharmacy has tried to fax [place] 3 times.  Advised pt we have not received any faxes and I had checked with clerical staff also.  Advised pt if she has requested a refill and has not heard from [place] in 24 hours to call us.  Please advise regarding refills.

## 2011-02-28 NOTE — Telephone Encounter (Signed)
Late entry.  Rx's sent to pharm on 02/27/11.  Pt aware.

## 2011-03-23 NOTE — Op Note (Signed)
Brenda [name], [name]              ACCOUNT NO.:  [id number]   MEDICAL RECORD NO.:  [id number]          PATIENT TYPE:  AMB   LOCATION:  DAY                           FACILITY:  [facility]   PHYSICIAN:  Barbaraann Barthel, M.D. DATE OF BIRTH:  02-11-1956   DATE OF PROCEDURE:  03/05/2007  DATE OF DISCHARGE:                               OPERATIVE REPORT   SURGEON:  Barbaraann Barthel, M.D.   PRE-AND-POSTOPERATIVE DIAGNOSIS:  Ganglion cyst, left wrist, dorsal  aspect.   NOTE:  This is a 55 year old white female who was admitted via the  outpatient department for excision of a ganglion cyst.  She had had this  for approximately 6 months.  She had been seen by the medical service  where she had an unsuccessful fine-needle aspiration of this.  She was  referred to surgery for excision.   GROSS FINDINGS:  Those consistent with a ganglion cyst with hemorrhage  within it on the dorsal aspect of the left wrist adherent to the flexor  tendons.   TECHNIQUE:  The patient was placed in the supine position, after the  adequate administration of Bier block anesthesia.  The patient had the  venous circulation evacuated with the Esmarch bandage; and then the  tourniquet was insufflated to 250 mmHg.  After prepping and draping in  the usual manner, a transverse incision was carried out over the  palpable mass.  This was removed without problem.  We cauterized its  insertion site on the flexor tendons.  We then checked for bleeding.  This was controlled easily with the needle tip cautery device.  We  irrigated with normal saline solution, closed the subcutaneous tissue  with 5-0 Vicryl, and the skin with 5-0 nylon.   A sterile dressing was applied with Neosporin ointment and a cock-up  splint was applied.  Prior to closure all sponge, needle, and instrument  counts were found to be correct.   ESTIMATED BLOOD LOSS:  Minimal. total tourniquette time: 27 min   The patient tolerated the procedure well; and  was taken to the recovery  room in satisfactory condition.      Barbaraann Barthel, M.D.  Electronically Signed     WB/MEDQ  D:  03/05/2007  T:  03/05/2007  Job:  [id number]   cc:   Jeoffrey Massed, MD  Fax: [phone]

## 2011-06-14 ENCOUNTER — Ambulatory Visit: Payer: Medicare Other | Admitting: Family Medicine

## 2011-06-21 ENCOUNTER — Ambulatory Visit: Payer: Medicare Other | Admitting: Family Medicine

## 2011-06-22 ENCOUNTER — Ambulatory Visit: Payer: Medicare Other | Admitting: Family Medicine

## 2011-06-27 ENCOUNTER — Encounter (HOSPITAL_COMMUNITY): Payer: Self-pay | Admitting: *Deleted

## 2011-06-27 ENCOUNTER — Emergency Department (HOSPITAL_COMMUNITY)
Admission: EM | Admit: 2011-06-27 | Discharge: 2011-06-27 | Disposition: A | Discharge status: Home / Self Care | Payer: Medicare Other | Attending: Emergency Medicine | Admitting: Emergency Medicine

## 2011-06-27 DIAGNOSIS — H539 Unspecified visual disturbance: Secondary | ICD-10-CM | POA: Insufficient documentation

## 2011-06-27 DIAGNOSIS — H5710 Ocular pain, unspecified eye: Secondary | ICD-10-CM

## 2011-06-27 DIAGNOSIS — H53149 Visual discomfort, unspecified: Secondary | ICD-10-CM | POA: Insufficient documentation

## 2011-06-27 DIAGNOSIS — F17200 Nicotine dependence, unspecified, uncomplicated: Secondary | ICD-10-CM | POA: Insufficient documentation

## 2011-06-27 DIAGNOSIS — E782 Mixed hyperlipidemia: Secondary | ICD-10-CM | POA: Insufficient documentation

## 2011-06-27 DIAGNOSIS — IMO0002 Reserved for concepts with insufficient information to code with codable children: Secondary | ICD-10-CM | POA: Insufficient documentation

## 2011-06-27 MED ORDER — TETRACAINE HCL 0.5 % OP SOLN
1.0000 [drp] | Freq: Once | OPHTHALMIC | Status: AC
  Administered 2011-06-27: 2 [drp] via OPHTHALMIC

## 2011-06-27 MED ORDER — ERYTHROMYCIN 5 MG/GM OP OINT
TOPICAL_OINTMENT | Freq: Once | OPHTHALMIC | Status: AC
  Administered 2011-06-27: 21:00:00 via OPHTHALMIC

## 2011-06-27 NOTE — ED Provider Notes (Signed)
Scribed for Dr. Fredricka Bonine, the patient was seen in room 23. This chart was scribed by Jannette Fogo. This patient's care was started at 20:38.   ED COURSE: 20:38 - PA Tammy Triplett consulted ED physician, Dr. Fredricka Bonine regarding exam. 20:41- Dr. Fredricka Bonine performed slit lamp exam which shows no evidence of foreign body or corneal abrasion. ED physician recommended treatment with Rx Erythromycin drops.  21:18 - Patient discharged per PA Tammy Triplett  PROCEDURES:  SLIT LAMP EXAM: Tetracaine drops used.  Lids everted and swept for exam, no evidence of foreign body.  Conjunctivae: left conjunctival injection   Cornea: No evidence of abrasion.  EOM: intact Pupils: PERRL Patient tolerated procedure well without immediate complications.   MDM: corneal abrasion, foreign body, conjunctivitis  IMPRESSION: Pain in or around eye  No apparent corneal abrasion or foreign body intraocularly on my examination PLAN:  Home   CONDITION ON DISCHARGE: Stable  MEDICATIONS GIVEN IN THE E.D.  Medications  tetracaine (PONTOCAINE) 0.5 % ophthalmic solution 1-2 drop (2 drop Left Eye Given 06/27/11 2024)  erythromycin ophthalmic ointment ( application Left Eye Given 06/27/11 2100)   Scribe attestation: I personally performed the services described in this documentation, which was scribed in my presence. The recorded information has been reviewed and considered.   Midlevel attestation: Medical screening examination/treatment/procedure(s) were conducted as a shared visit with non-physician practitioner(s) and myself.  I personally evaluated the patient during the encounter   Felisa Bonier, MD 06/27/11 2328

## 2011-06-27 NOTE — ED Provider Notes (Signed)
History     CSN: [id number] Arrival date & time: 06/27/2011  8:12 PM  Chief Complaint  Patient presents with  . Eye Pain    left eye   HPI Comments: Patient c/o pain and redness to her left eye for 4 days.  States she was sewing approx one week ago and the sewing needle broke and she felt something graze her eye but she did not experience pain or visual change until 4 days ago.  She describes a sharp pain underneath the left upper eye lid and describes a "greasy film" over her eye.  She denies headaches, vomiting, neck pain or visual loss  Patient is a 55 y.o. female presenting with eye pain. The history is provided by the patient.  Eye Pain This is a new problem. The current episode started in the past 7 days. The problem occurs constantly. The problem has been unchanged. Associated symptoms include a visual change. Pertinent negatives include no arthralgias, chest pain, congestion, diaphoresis, fatigue, fever, headaches, myalgias, nausea, neck pain, numbness, rash, swollen glands, vomiting or weakness. Exacerbated by: blinking, light. She has tried nothing for the symptoms. The treatment provided mild relief.    Past Medical History  Diagnosis Date  . Chronic LBP   . DDD (degenerative disc disease)     spinal stenosis  . Hyperlipidemia     mixed  . History of GI bleed     NSAIDS  . Recurrent UTI   . Microscopic hematuria     full w/ u unrevealing X 2  . Tobacco dependence     w/ only occasional bronchitis illness (CT shest 2009 nl- for f/u of ? nodule on CXR)  . Atypical chest pain 2007    cardiolyte neg, echo nl, cath showed mild/nonobstructive LAD disease  . Normal nuclear stress test 11/11    negative, EF normal preop  . Palpitations 2006    48H holter neg    Past Surgical History  Procedure Date  . Lumbar spine surgery 1993    right iliac crest bone graft+metal instrumentation; 2005 metal removal and decompression, 2011 lumbar decompression 4-11, then stabilization/  instrumentation done 09-19-10: L2,L3, L5 left and L2 , L3, L4 right pedical remnant L4 left embedded. Left iliac crest bone graft-- Dr Clotilde Dieter  . Appendectomy 1984  . Abdominal hysterectomy 1997    DUB  . Right oopherectomy     cyst  . Tonsillectomy 55 yrs old  . Knee arthroscopy     right and left  . Left wrist ganglion cyst excision 2010    Family History  Problem Relation Age of Onset  . Coronary artery disease Neg Hx   . Other Mother     heart and thyroid problems  . Hyperlipidemia Mother   . Diabetes Mother   . Cancer Mother     breast  . Other Father     CHF  . Other Brother     heart, back problems  . Hypertension Brother   . Cancer Paternal Grandmother     mouth- snuff  . Heart attack Paternal Grandfather     History  Substance Use Topics  . Smoking status: Current Some Day Smoker -- 0.2 packs/day for 35 years  . Smokeless tobacco: Never Used  . Alcohol Use: No    OB History    Grav Para Term Preterm Abortions TAB SAB Ect Mult Living                  Review of  Systems  Constitutional: Negative for fever, diaphoresis and fatigue.  HENT: Negative for ear pain, congestion, facial swelling, neck pain and neck stiffness.   Eyes: Positive for photophobia, pain, redness and visual disturbance. Negative for discharge and itching.  Respiratory: Negative.   Cardiovascular: Negative for chest pain.  Gastrointestinal: Negative for nausea and vomiting.  Musculoskeletal: Negative for myalgias and arthralgias.  Skin: Negative.  Negative for rash.  Neurological: Negative for dizziness, facial asymmetry, speech difficulty, weakness, numbness and headaches.  Hematological: Does not bruise/bleed easily.    Physical Exam  BP 140/73  Pulse 93  Temp(Src) 98.5 F (36.9 C) (Oral)  Resp 14  Ht 5\' 5"  (1.651 m)  Wt 164 lb (74.39 kg)  BMI 27.29 kg/m2  SpO2 98%  Physical Exam  Constitutional: She is oriented to person, place, and time. She appears well-developed and  well-nourished. No distress.  HENT:  Head: Normocephalic and atraumatic.  Right Ear: External ear normal.  Left Ear: External ear normal.  Mouth/Throat: Oropharynx is clear and moist.  Eyes: EOM are normal. Pupils are equal, round, and reactive to light. No foreign bodies found. Left eye exhibits no chemosis, no discharge, no exudate and no hordeolum. No foreign body present in the left eye. Left conjunctiva is injected. Left conjunctiva has no hemorrhage. Left eye exhibits normal extraocular motion and no nystagmus. Pupils are equal.  Fundoscopic exam:      The left eye shows no papilledema.  Slit lamp exam:      The left eye shows no corneal abrasion, no corneal flare, no corneal ulcer, no foreign body, no hyphema and no fluorescein uptake.  Neck: Normal range of motion. Neck supple.  Cardiovascular: Normal rate, regular rhythm and normal heart sounds.   Pulmonary/Chest: Effort normal and breath sounds normal.  Musculoskeletal: She exhibits no edema and no tenderness.  Lymphadenopathy:    She has no cervical adenopathy.  Neurological: She is alert and oriented to person, place, and time. She has normal reflexes. No cranial nerve deficit. She exhibits normal muscle tone. Coordination normal.  Skin: Skin is warm and dry.    ED Course  Procedures  MDM   2100  Patient was also seen by EDP.  No FB's were seen to the corneal or eyelids.  I have given pt erythromycin ointment with instructions for use and referral for close f/u with Dr. Lita Mains.  Pt verbalized understanding and agrees to care plan      Chancellor Vanderloop L. Tibbie, [place] 06/27/11 2113

## 2011-06-27 NOTE — ED Notes (Addendum)
Pt states last week she was sewing a blanket and the needle broke and she felt something hit her left eye; pt states she did not begin having pain until a few days after the incident and today she states she can barely open the left eye and it is very sore; eye is red

## 2011-06-29 ENCOUNTER — Ambulatory Visit (INDEPENDENT_AMBULATORY_CARE_PROVIDER_SITE_OTHER): Payer: Medicare Other | Admitting: Family Medicine

## 2011-06-29 ENCOUNTER — Encounter: Payer: Self-pay | Admitting: Family Medicine

## 2011-06-29 DIAGNOSIS — E785 Hyperlipidemia, unspecified: Secondary | ICD-10-CM

## 2011-06-29 DIAGNOSIS — G8929 Other chronic pain: Secondary | ICD-10-CM

## 2011-06-29 DIAGNOSIS — E119 Type 2 diabetes mellitus without complications: Secondary | ICD-10-CM

## 2011-06-29 DIAGNOSIS — M545 Low back pain: Secondary | ICD-10-CM

## 2011-06-29 DIAGNOSIS — H5710 Ocular pain, unspecified eye: Secondary | ICD-10-CM

## 2011-06-29 MED ORDER — CYCLOBENZAPRINE HCL 10 MG PO TABS: 10.0000 mg | ORAL_TABLET | Freq: Three times a day (TID) | ORAL | Status: DC | PRN

## 2011-06-29 MED ORDER — HYDROCODONE-ACETAMINOPHEN 5-500 MG PO TABS: 1.0000 | ORAL_TABLET | Freq: Four times a day (QID) | ORAL | Status: DC | PRN

## 2011-06-29 NOTE — Assessment & Plan Note (Signed)
Continue diet and meds.  Increase exercise as able. She is fasting today so we'll check FLP.

## 2011-06-29 NOTE — Progress Notes (Addendum)
OFFICE VISIT  06/29/2011   CC:  Chief Complaint  Patient presents with  . Follow-up    4 month follow up     HPI:    Patient is a 55 y.o. Caucasian female who presents for routine f/u for DM 2, hyperlipidemia, and chronic low back pain. She also has had about a week of left eye pain.  She woke up one morning and it was red and painful.  No itching, no drainage.  No blurry vision or visual acuity complaints, no visual field limitations. She went to [facility] ED 06/27/11 and they saw no foreign body and no corneal abrasion.  She was given erythromycin ointment and has been using this but nothing has changed.  The eye hurts the most when she moves it in downward direction.  It is somewhat relieved by closing the eye and holding it still. No fever, no recent ST or URI.    Regarding glucoses: daily or qod checks show fastings <100.  Diabetic diet, low chol diet. No exercise due to chronic low back pain, which she says is still bothering her quite a bit but she remains functional by using her pain meds and muscle relaxer. Most recent lumbar spine surgery around Nov 2011:  it helped her radiculopathic leg pain but not the chronic low back pain.   Past Medical History  Diagnosis Date  . Chronic LBP   . DDD (degenerative disc disease)     spinal stenosis  . Hyperlipidemia     mixed  . History of GI bleed     NSAIDS  . Recurrent UTI   . Microscopic hematuria     full w/ u unrevealing X 2  . Tobacco dependence     w/ only occasional bronchitis illness (CT shest 2009 nl- for f/u of ? nodule on CXR)  . Atypical chest pain 2007    cardiolyte neg, echo nl, cath showed mild/nonobstructive LAD disease  . Normal nuclear stress test 11/11    negative, EF normal preop  . Palpitations 2006    48H holter neg    Past Surgical History  Procedure Date  . Lumbar spine surgery 1993    right iliac crest bone graft+metal instrumentation; 2005 metal removal and decompression, 2011 lumbar decompression  4-11, then stabilization/ instrumentation done 09-19-10: L2,L3, L5 left and L2 , L3, L4 right pedical remnant L4 left embedded. Left iliac crest bone graft-- Dr Clotilde Dieter  . Appendectomy 1984  . Abdominal hysterectomy 1997    DUB  . Right oopherectomy     cyst  . Tonsillectomy 55 yrs old  . Knee arthroscopy     right and left  . Left wrist ganglion cyst excision 2010    Outpatient Prescriptions Prior to Visit  Medication Sig Dispense Refill  . esomeprazole (NEXIUM) 40 MG capsule Take 40 mg by mouth daily.        . fenofibrate 160 MG tablet Take 160 mg by mouth daily.        . fluvastatin XL (LESCOL XL) 80 MG 24 hr tablet Take 80 mg by mouth daily.        . cyclobenzaprine (FLEXERIL) 10 MG tablet Take 1 tablet (10 mg total) by mouth every 8 (eight) hours as needed. For muscle spasms  30 tablet  3  . HYDROcodone-acetaminophen (VICODIN) 5-500 MG per tablet Take 1 tablet by mouth every 6 (six) hours as needed. 1 tab po q 4-6 hours prn pain  90 tablet  3  Allergies  Allergen Reactions  . Amitriptyline Hcl     REACTION: increased heart rate  . Aspirin     REACTION: GI Bleed  . Beta Adrenergic Blockers     REACTION: decreased libido  . Nsaids     REACTION: GI Upset  . Penicillins     REACTION: swelling  . Prochlorperazine Edisylate     REACTION: Stroke like symptoms  . Sulfonamide Derivatives     REACTION: unsure rxn    ROS As per HPI  PE: Blood pressure 109/73, pulse 84, temperature 98.6 F (37 C), temperature source Oral, height 5\' 5"  (1.651 m), weight 167 lb 12.8 oz (76.114 kg), SpO2 97.00%. Gen: Alert, well appearing.  Patient is oriented to person, place, time, and situation. HEENT: right eye without erythema or swelling or exudate.  Left eye with no soft tissue swelling or erythema and no active drainage.  She has diffuse bulbar>palpebral conjunctival injection on the left eye, slight chemosis noted.  No foreign body seen.  PERRLA, EOMI. Neck: supple, ROM full.  Carotids  2+ bilat, without bruit.  No lymphadenopathy, thyromegaly, or mass. Chest: symmetric expansion, nonlabored respirations.  Clear and equal breath sounds in all lung fields.   CV: RRR, no m/r/g.  Peripheral pulses 2+ and symmetric. EXT: no clubbing, cyanosis, or edema.   DP and PT pulses 2+ bilat.  Monofilament testing shows mild diffuse sensory deficit but she is diffusely calloused on both plantar surfaces.  No skin breakdown or erythema.  Position sense is intact bilat.  LABS:  none  IMPRESSION AND PLAN:  DIAB W/O COMP TYPE II/UNS NOT STATED UNCNTRL Diet controlled, doing well. Continue current monitoring and diet/exercise. Check HbA1c today as well as urine microalb/cr ratio. Routine f/u 4 mo.  HYPERLIPIDEMIA Continue diet and meds.  Increase exercise as able. She is fasting today so we'll check FLP.   BACK PAIN, LUMBAR, CHRONIC Problem stable.  Continue current medications and diet appropriate for this condition.  We have reviewed our general long term plan for this problem and also reviewed symptoms and signs that should prompt the patient to call or return to the office. RFd vicodin and flexeril today: see orders.  Eye pain Left eye: suspicious for corneal abrasion vs foreign body.   Eye patch placed today.  She has erythromycin ointment.  We have arranged for her to get ophtho eval in [place] now b/c I don't think she can wait until her appt with Dr. Bing Plume in [place] in 4d.     FOLLOW UP: Return in about 4 months (around 10/29/2011) for f/u DM and hyperlip.

## 2011-06-29 NOTE — Assessment & Plan Note (Signed)
Problem stable.  Continue current medications and diet appropriate for this condition.  We have reviewed our general long term plan for this problem and also reviewed symptoms and signs that should prompt the patient to call or return to the office. RFd vicodin and flexeril today: see orders.

## 2011-06-29 NOTE — Assessment & Plan Note (Signed)
Left eye: suspicious for corneal abrasion vs foreign body.   Eye patch placed today.  She has erythromycin ointment.  We have arranged for her to get ophtho eval in [place] now b/c I don't think she can wait until her appt with Dr. Bing Plume in [place] in 4d.

## 2011-06-29 NOTE — Assessment & Plan Note (Signed)
Diet controlled, doing well. Continue current monitoring and diet/exercise. Check HbA1c today as well as urine microalb/cr ratio. Routine f/u 4 mo.

## 2011-06-30 LAB — COMPREHENSIVE METABOLIC PANEL
ALT: 10 U/L (ref 0–35)
AST: 13 U/L (ref 0–37)
Alkaline Phosphatase: 67 U/L (ref 39–117)
BUN: 12 mg/dL (ref 6–23)
Calcium: 10.4 mg/dL (ref 8.4–10.5)
Chloride: 100 mEq/L (ref 96–112)
Creat: 0.69 mg/dL (ref 0.50–1.10)

## 2011-06-30 LAB — LIPID PANEL
LDL Cholesterol: 185 mg/dL — ABNORMAL HIGH (ref 0–99)
Total CHOL/HDL Ratio: 6 Ratio
Triglycerides: 154 mg/dL — ABNORMAL HIGH (ref <150)
VLDL: 31 mg/dL (ref 0–40)

## 2011-06-30 LAB — MICROALBUMIN / CREATININE URINE RATIO: Microalb, Ur: 0.5 mg/dL (ref 0.00–1.89)

## 2011-07-02 ENCOUNTER — Telehealth: Payer: Self-pay | Admitting: *Deleted

## 2011-07-02 NOTE — Telephone Encounter (Signed)
PC from pt stating pharmacy did not receive RX for pain meds.  Chart review shows 2 sets of dosing instructions.  Per Dr. Milinda Cave, correct Sig is 1 every 6 hours prn.  This is corrected on med list and with [facility]. Lescol removed per 07/02/11 result note, added Crestor 5 and 10 mg tabs to med list.

## 2011-07-23 NOTE — ED Provider Notes (Signed)
Evaluation and management procedures were performed by the PA/NP under my supervision/collaboration.    Itzell Bendavid D Tiare Rohlman, MD 07/23/11 1302 

## 2011-10-05 ENCOUNTER — Telehealth: Payer: Self-pay | Admitting: Family Medicine

## 2011-10-05 NOTE — Telephone Encounter (Signed)
Notified pt samples at front desk.

## 2011-10-05 NOTE — Telephone Encounter (Signed)
Are there any crestor samples available?

## 2011-10-25 ENCOUNTER — Encounter: Payer: Self-pay | Admitting: Family Medicine

## 2011-10-25 ENCOUNTER — Ambulatory Visit (INDEPENDENT_AMBULATORY_CARE_PROVIDER_SITE_OTHER): Payer: Medicare Other | Admitting: Family Medicine

## 2011-10-25 DIAGNOSIS — M791 Myalgia, unspecified site: Secondary | ICD-10-CM

## 2011-10-25 DIAGNOSIS — E119 Type 2 diabetes mellitus without complications: Secondary | ICD-10-CM

## 2011-10-25 DIAGNOSIS — IMO0001 Reserved for inherently not codable concepts without codable children: Secondary | ICD-10-CM

## 2011-10-25 DIAGNOSIS — G8929 Other chronic pain: Secondary | ICD-10-CM

## 2011-10-25 DIAGNOSIS — E782 Mixed hyperlipidemia: Secondary | ICD-10-CM

## 2011-10-25 DIAGNOSIS — E785 Hyperlipidemia, unspecified: Secondary | ICD-10-CM

## 2011-10-25 DIAGNOSIS — Z23 Encounter for immunization: Secondary | ICD-10-CM

## 2011-10-25 DIAGNOSIS — M545 Low back pain, unspecified: Secondary | ICD-10-CM

## 2011-10-25 LAB — LIPID PANEL
Cholesterol: 196 mg/dL (ref 0–200)
HDL: 43 mg/dL (ref >39)
Total CHOL/HDL Ratio: 4.6 Ratio
Triglycerides: 195 mg/dL — ABNORMAL HIGH (ref <150)

## 2011-10-25 LAB — ALT: ALT: 11 U/L (ref 0–35)

## 2011-10-25 LAB — AST: AST: 15 U/L (ref 0–37)

## 2011-10-25 MED ORDER — CYCLOBENZAPRINE HCL 10 MG PO TABS: 10.0000 mg | ORAL_TABLET | Freq: Three times a day (TID) | ORAL | Status: DC | PRN

## 2011-10-25 MED ORDER — HYDROCODONE-ACETAMINOPHEN 5-500 MG PO TABS: 1.0000 | ORAL_TABLET | Freq: Four times a day (QID) | ORAL | Status: DC | PRN

## 2011-10-25 MED ORDER — ESOMEPRAZOLE MAGNESIUM 40 MG PO CPDR: 40.0000 mg | DELAYED_RELEASE_CAPSULE | Freq: Every day | ORAL | Status: DC

## 2011-10-25 MED ORDER — FENOFIBRATE 160 MG PO TABS: 160.0000 mg | ORAL_TABLET | Freq: Every day | ORAL | Status: DC

## 2011-10-25 NOTE — Progress Notes (Signed)
OFFICE NOTE  10/25/2011  CC:  Chief Complaint  Patient presents with  . Follow-up    on Diabetes and cholesterol  . Injections    flu vaccination     HPI:   Patient is a 55 y.o. Caucasian female who is here for hyperlipidemia, DM 2, and chronic LBP follow up. Feels well.  Pain well controlled with current pain meds, needs RFs. Only checks glucose occasionally, always normal per pt--manages DM with diet very well. Has been taking crestor for 44mo or so (switched from lescol XL b/c LDL still not at goal), says myalgias were severe at first but over the last couple of weeks they have subsided significantly and are much more tolerable/mild.  She never stopped taking the med or reported her pain to [place].  She has been out of/off her fenofibrate for a few months, also out of nexium for a few weeks and feels her GERD bothering her and asks for RF.  She tries to eat a low fat/low chol diet.  No formal exercise. Asks for flu vaccine and Tdap today.  Pertinent PMH:  Past Medical History  Diagnosis Date  . Chronic LBP   . DDD (degenerative disc disease)     spinal stenosis  . Hyperlipidemia     mixed  . History of GI bleed     NSAIDS  . Recurrent UTI   . Microscopic hematuria     full w/ u unrevealing X 2  . Tobacco dependence     w/ only occasional bronchitis illness (CT shest 2009 nl- for f/u of ? nodule on CXR)  . Atypical chest pain 2007    cardiolyte neg, echo nl, cath showed mild/nonobstructive LAD disease  . Normal nuclear stress test 11/11    negative, EF normal preop  . Palpitations 2006    48H holter neg   Past Surgical History  Procedure Date  . Lumbar spine surgery 1993    right iliac crest bone graft+metal instrumentation; 2005 metal removal and decompression, 2011 lumbar decompression 4-11, then stabilization/ instrumentation done 09-19-10: L2,L3, L5 left and L2 , L3, L4 right pedical remnant L4 left embedded. Left iliac crest bone graft-- Dr Clotilde Dieter  . Appendectomy  1984  . Abdominal hysterectomy 1997    DUB  . Right oopherectomy     cyst  . Tonsillectomy 55 yrs old  . Knee arthroscopy     right and left  . Left wrist ganglion cyst excision 2010   Past family and social history reviewed and there are no changes since the patient's last office visit with me.   MEDS;   Outpatient Prescriptions Prior to Visit  Medication Sig Dispense Refill  . rosuvastatin (CRESTOR) 10 MG tablet Take 10 mg by mouth daily.       . cyclobenzaprine (FLEXERIL) 10 MG tablet Take 1 tablet (10 mg total) by mouth every 8 (eight) hours as needed. For muscle spasms  30 tablet  3  . HYDROcodone-acetaminophen (VICODIN) 5-500 MG per tablet Take 1 tablet by mouth every 6 (six) hours as needed.        Marland Kitchen esomeprazole (NEXIUM) 40 MG capsule Take 40 mg by mouth daily.        . fenofibrate 160 MG tablet Take 160 mg by mouth daily.          PE: Blood pressure 133/82, pulse 83, temperature 98 F (36.7 C), temperature source Oral, height 5\' 5"  (1.651 m), weight 174 lb 12.8 oz (79.289 kg), SpO2 95.00%. Gen:  Alert, well appearing.  Patient is oriented to person, place, time, and situation. ENT: Ears: EACs clear, normal epithelium.  TMs with good light reflex and landmarks bilaterally.  Eyes: no injection, icteris, swelling, or exudate.  EOMI, PERRLA. Nose: no drainage or turbinate edema/swelling.  No injection or focal lesion.  Mouth: lips without lesion/swelling.  Oral mucosa pink and moist.  Dentition intact and without obvious caries or gingival swelling.  Oropharynx without erythema, exudate, or swelling.  Neck - No masses or thyromegaly or limitation in range of motion CV: RRR, no m/r/g.   LUNGS: CTA bilat, nonlabored resps, good aeration in all lung fields. EXT: no clubbing, cyanosis, or edema.   Lab: none today.  IMPRESSION AND PLAN:  HYPERLIPIDEMIA Myalgias on crestor but seems to be resolving. Will recheck FLP today and will check CPK total and AST/ALT. RF'd  fenofibrate.  DIAB W/O COMP TYPE II/UNS NOT STATED UNCNTRL Diet controlled. Control has been excellent: last HbA1c 5.7% 06/2011. Urine microalb neg 06/2011. Foot exam normal 06/2011. Will need to clarify when last diab retpthy screening was.   BACK PAIN, LUMBAR, CHRONIC Problem stable.  Continue current medications and diet appropriate for this condition.  We have reviewed our general long term plan for this problem and also reviewed symptoms and signs that should prompt the patient to call or return to the office. RFs for vicodin and flexeril done today.   Flu vaccine IM and Tdap given IM today.  FOLLOW UP:  Return in about 4 months (around 02/23/2012).

## 2011-10-25 NOTE — Assessment & Plan Note (Signed)
Myalgias on crestor but seems to be resolving. Will recheck FLP today and will check CPK total and AST/ALT. RF'd fenofibrate.

## 2011-10-25 NOTE — Assessment & Plan Note (Addendum)
Diet controlled. Control has been excellent: last HbA1c 5.7% 06/2011. Urine microalb neg 06/2011. Foot exam normal 06/2011. Will need to clarify when last diab retpthy screening was.

## 2011-10-25 NOTE — Assessment & Plan Note (Signed)
Problem stable.  Continue current medications and diet appropriate for this condition.  We have reviewed our general long term plan for this problem and also reviewed symptoms and signs that should prompt the patient to call or return to the office. RFs for vicodin and flexeril done today.

## 2011-11-05 ENCOUNTER — Encounter: Payer: Self-pay | Admitting: Family Medicine

## 2011-11-05 ENCOUNTER — Ambulatory Visit (INDEPENDENT_AMBULATORY_CARE_PROVIDER_SITE_OTHER): Payer: Medicare Other | Admitting: Family Medicine

## 2011-11-05 VITALS — BP 121/80 | HR 85 | Temp 97.7°F | Wt 176.0 lb

## 2011-11-05 DIAGNOSIS — J209 Acute bronchitis, unspecified: Secondary | ICD-10-CM

## 2011-11-05 DIAGNOSIS — R05 Cough: Secondary | ICD-10-CM

## 2011-11-05 MED ORDER — ALBUTEROL SULFATE (5 MG/ML) 0.5% IN NEBU: 2.5000 mg | INHALATION_SOLUTION | Freq: Once | RESPIRATORY_TRACT | Status: DC

## 2011-11-05 MED ORDER — ALBUTEROL SULFATE HFA 108 (90 BASE) MCG/ACT IN AERS: 2.0000 | INHALATION_SPRAY | Freq: Four times a day (QID) | RESPIRATORY_TRACT | Status: DC | PRN

## 2011-11-05 MED ORDER — HYDROCODONE-HOMATROPINE 5-1.5 MG/5ML PO SYRP: ORAL_SOLUTION | ORAL | Status: AC

## 2011-11-05 MED ORDER — IPRATROPIUM BROMIDE 0.02 % IN SOLN: 0.5000 mg | Freq: Once | RESPIRATORY_TRACT | Status: DC

## 2011-11-05 NOTE — Patient Instructions (Signed)
Buy OTC nasal saline spray and use 2-3 sprays in each nostril 2-3 times daily. Buy generic OTC allegra 180mg , take 1 tab once daily.

## 2011-11-05 NOTE — Progress Notes (Signed)
OFFICE NOTE  11/05/2011  CC:  Chief Complaint  Patient presents with  . URI    green productive cough, green nasal drainage, sore throat, fever (102 last night) x 2 days.  States hard to get good breath.     HPI: Patient is a 55 y.o. Caucasian female who is here for sore throat and cough. Pt presents complaining of respiratory symptoms for 2  days.  Mostly nasal congestion/runny nose, sneezing, and PND cough.  Worst symptoms seems to be the sore throat and  +Chest cough lately, feels tight in central chest area, a bit difficult to breath.  Lately the symptoms seem to be worsening. Tm 102.   Occ wheezing/SOB.  No pain in face or teeth.  Symptoms made worse by cold air.  Symptoms improved by tylenol. Smoker? + Hx.  None in the last 2 months. Recent sick contact? Yes (strep, bronchitis). Muscle or joint aches? No. She got the flu vaccine 9 days ago here.  ROS: no n/v/d or abdominal pain.  No rash.  No neck stiffness.   +Mild fatigue.  +Mild appetite loss.   Pertinent PMH:  Past Medical History  Diagnosis Date  . Chronic LBP   . DDD (degenerative disc disease)     spinal stenosis  . Hyperlipidemia     mixed  . History of GI bleed     NSAIDS  . Recurrent UTI   . Microscopic hematuria     full w/ u unrevealing X 2  . Tobacco dependence     w/ only occasional bronchitis illness (CT shest 2009 nl- for f/u of ? nodule on CXR)  . Atypical chest pain 2007    cardiolyte neg, echo nl, cath showed mild/nonobstructive LAD disease  . Normal nuclear stress test 11/11    negative, EF normal preop  . Palpitations 2006    48H holter neg    Pertinent Meds: Tylenol, nexium, fenofibrate, crestor, flexeril prn, vicodin prn  PE: Blood pressure 121/80, pulse 85, temperature 97.7 F (36.5 C), temperature source Temporal, weight 176 lb (79.833 kg), SpO2 94.00%. Gen: Alert, tired/sick-appearing but nontoxic.  Patient is oriented to person, place, time, and situation. VS: noted--normal,  except RR in 50s HEENT: eyes without injection, drainage, or swelling.  Ears: EACs clear, TMs with normal light reflex and landmarks.  Nose: Clear rhinorrhea, with some dried, crusty exudate adherent to mildly injected mucosa.  No purulent d/c.  No paranasal sinus TTP.  No facial swelling.  Throat and mouth without focal lesion.  No pharyngial swelling, erythema, or exudate.   Neck: supple, no LAD.   LUNGS: CTA bilat, nonlabored resps but RR elevated.   CV: RRR, no m/r/g. EXT: no c/c/e SKIN: no rash  IMPRESSION AND PLAN: Influenza-like illness, major bronchitic component. Duoneb given in office today--improved significantly: RR down to 20, aeration and sense of breathlessness much improved.  Self-limited nature of this illness was discussed, questions answered.  Discussed symptomatic care, rest, fluids.   Warning signs/symptoms of worsening illness were discussed.  Patient instructed to call or return if any of these occur. Hycodan susp 1-2 tsp q6h prn, #164ml, no RF. May use OTC nasal saline spray or irrigation solution bid. OTC nonsedating antihistamines prn discussed.    Hold off on systemic steroids at this time.  If still with prominent chest tightness in 4d she'll call and we'll add prednisone or see her again.  FOLLOW UP: prn

## 2011-12-24 ENCOUNTER — Telehealth: Payer: Self-pay | Admitting: *Deleted

## 2011-12-24 MED ORDER — LORAZEPAM 1 MG PO TABS: 1.0000 mg | ORAL_TABLET | Freq: Two times a day (BID) | ORAL | Status: DC | PRN

## 2011-12-24 NOTE — Telephone Encounter (Signed)
RX faxed

## 2011-12-24 NOTE — Telephone Encounter (Signed)
Rx printed for lorazepam 1mg  q12h prn, #20, no RF--PM

## 2011-12-24 NOTE — Telephone Encounter (Signed)
PC from pt's daughter, Selena Batten, stating that pt's mother-in-law passed away about 1 hour ago and pt is not dealing with events well.  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  Daughter/pt would like to know if we can call in medication for pt's "nerves".  Please advise.

## 2012-01-01 ENCOUNTER — Other Ambulatory Visit: Payer: Self-pay | Admitting: Family Medicine

## 2012-01-01 NOTE — Telephone Encounter (Signed)
RX faxed

## 2012-01-01 NOTE — Telephone Encounter (Signed)
Last seen for LBP 10/25/11, follow up on 02/21/11.  Last filled 12/20, #90 x 1

## 2012-02-08 ENCOUNTER — Other Ambulatory Visit: Payer: Self-pay | Admitting: Family Medicine

## 2012-02-08 MED ORDER — ROSUVASTATIN CALCIUM 10 MG PO TABS: 10.0000 mg | ORAL_TABLET | Freq: Every day | ORAL | Status: DC

## 2012-02-08 NOTE — Telephone Encounter (Signed)
Refill request received from patient for Crestor Last filled by MD on 07/02/11 Last seen on 10/25/11 Follow up 02/21/12 RX sent.

## 2012-02-21 ENCOUNTER — Encounter: Payer: Self-pay | Admitting: Family Medicine

## 2012-02-21 ENCOUNTER — Other Ambulatory Visit: Payer: Self-pay | Admitting: Family Medicine

## 2012-02-21 ENCOUNTER — Ambulatory Visit (INDEPENDENT_AMBULATORY_CARE_PROVIDER_SITE_OTHER): Payer: Medicare Other | Admitting: Family Medicine

## 2012-02-21 VITALS — BP 139/81 | HR 73 | Ht 65.0 in | Wt 174.0 lb

## 2012-02-21 DIAGNOSIS — Z1211 Encounter for screening for malignant neoplasm of colon: Secondary | ICD-10-CM

## 2012-02-21 DIAGNOSIS — E785 Hyperlipidemia, unspecified: Secondary | ICD-10-CM | POA: Diagnosis not present

## 2012-02-21 DIAGNOSIS — M545 Low back pain, unspecified: Secondary | ICD-10-CM

## 2012-02-21 DIAGNOSIS — Z1239 Encounter for other screening for malignant neoplasm of breast: Secondary | ICD-10-CM

## 2012-02-21 DIAGNOSIS — Z23 Encounter for immunization: Secondary | ICD-10-CM

## 2012-02-21 DIAGNOSIS — E119 Type 2 diabetes mellitus without complications: Secondary | ICD-10-CM

## 2012-02-21 LAB — LIPID PANEL
Cholesterol: 163 mg/dL (ref 0–200)
Triglycerides: 93 mg/dL (ref <150)
VLDL: 19 mg/dL (ref 0–40)

## 2012-02-21 MED ORDER — ROSUVASTATIN CALCIUM 10 MG PO TABS: 10.0000 mg | ORAL_TABLET | Freq: Every day | ORAL | Status: DC

## 2012-02-21 MED ORDER — LORAZEPAM 1 MG PO TABS: 1.0000 mg | ORAL_TABLET | Freq: Two times a day (BID) | ORAL | Status: AC | PRN

## 2012-02-21 MED ORDER — HYDROCODONE-ACETAMINOPHEN 5-500 MG PO TABS: ORAL_TABLET | ORAL | Status: DC

## 2012-02-21 NOTE — Progress Notes (Signed)
Addended by: Andrew Au on: 02/21/2012 05:05 PM   Modules accepted: Orders

## 2012-02-21 NOTE — Assessment & Plan Note (Signed)
Stable, takes 1-3 vicodin tabs per day--no sign of misuse or escalation of dose. RF given today.

## 2012-02-21 NOTE — Assessment & Plan Note (Signed)
Offered to set this up today but she wants to put it off until next visit b/c of what she's dealing with psychologically with the death of her mother in law currently.

## 2012-02-21 NOTE — Progress Notes (Signed)
Addended by: Luisa Dago on: 02/21/2012 04:50 PM   Modules accepted: Orders

## 2012-02-21 NOTE — Assessment & Plan Note (Signed)
iFob given today  (She is extremely apprehensive about colonoscopy and would prefer to screen this way for now).

## 2012-02-21 NOTE — Assessment & Plan Note (Signed)
Home glucose checks are normal (fasting and 2H PP). Last HbA1c was 5.7% 10/2011.  Will recheck A1c next f/u in 42mo. She had eye check when she was seen for another eye issue late last year.  All other monitoring for DM 2 UTD. Pneumovax given today.

## 2012-02-21 NOTE — Progress Notes (Signed)
OFFICE VISIT  02/21/2012   CC:  Chief Complaint  Patient presents with  . Follow-up    DM, HTN     HPI:    Patient is a 56 y.o. Caucasian female who presents for routine f/u. Chronic low back pain: "I'm holding my own".  Still deals with chronic lumbar pain, occ radiation down back of thigh on left--no significant changes from last f/u.   She is on prn f/u schedule with her back MD.  Hyperlipidemia: taking meds and has no myalgias or abd pains.  DM 2: glucoses 90-100 consistently any time she checks it.  Eats a fairly good diabetic diet and walks.  Psych:grief response to the death of her mother in law 01/23/12, whom she had been living with for a decade and was close to.  Has good days and bad days as expected, occasionally gets broken up enough about things that she takes a lorazepam and she gets calmed down.  Denies persistent depression. THe 30 lorazepam I rx'd 2012-01-23 lasted her over a month.  Past Medical History  Diagnosis Date  . Chronic LBP   . DDD (degenerative disc disease)     spinal stenosis  . Hyperlipidemia     mixed  . History of GI bleed     NSAIDS  . Recurrent UTI   . Microscopic hematuria     full w/ u unrevealing X 2  . Tobacco dependence     w/ only occasional bronchitis illness (CT shest 2009 nl- for f/u of ? nodule on CXR)  . Atypical chest pain 2007    cardiolyte neg, echo nl, cath showed mild/nonobstructive LAD disease  . Normal nuclear stress test 11/11    negative, EF normal preop  . Palpitations 2006    48H holter neg    Past Surgical History  Procedure Date  . Lumbar spine surgery 1993    right iliac crest bone graft+metal instrumentation; 2005 metal removal and decompression, 2011 lumbar decompression 4-11, then stabilization/ instrumentation done 09-19-10: L2,L3, L5 left and L2 , L3, L4 right pedical remnant L4 left embedded. Left iliac crest bone graft-- Dr Clotilde Dieter  . Appendectomy 1984  . Abdominal hysterectomy 1997    DUB  . Right  oopherectomy     cyst  . Tonsillectomy 56 yrs old  . Knee arthroscopy     right and left  . Left wrist ganglion cyst excision 2010    Outpatient Prescriptions Prior to Visit  Medication Sig Dispense Refill  . acetaminophen (TYLENOL) 160 MG/5ML solution Take 325 mg by mouth every 4 (four) hours as needed.        Marland Kitchen albuterol (VENTOLIN HFA) 108 (90 BASE) MCG/ACT inhaler Inhale 2 puffs into the lungs every 6 (six) hours as needed for wheezing.  1 Inhaler  1  . cyclobenzaprine (FLEXERIL) 10 MG tablet Take 1 tablet (10 mg total) by mouth every 8 (eight) hours as needed. For muscle spasms  30 tablet  5  . esomeprazole (NEXIUM) 40 MG capsule Take 1 capsule (40 mg total) by mouth daily.  30 capsule  10  . fenofibrate 160 MG tablet Take 1 tablet (160 mg total) by mouth daily.  30 tablet  6  . HYDROcodone-acetaminophen (VICODIN) 5-500 MG per tablet take 1 tablet every 6 hours if needed  90 tablet  1  . rosuvastatin (CRESTOR) 10 MG tablet Take 1 tablet (10 mg total) by mouth daily.  30 tablet  0  . rosuvastatin (CRESTOR) 5 MG tablet Take  5 mg by mouth daily.          Allergies  Allergen Reactions  . Amitriptyline Hcl     REACTION: increased heart rate  . Aspirin     REACTION: GI Bleed  . Beta Adrenergic Blockers     REACTION: decreased libido  . Nsaids     REACTION: GI Upset  . Penicillins     REACTION: swelling  . Prochlorperazine Edisylate     REACTION: Stroke like symptoms  . Sulfonamide Derivatives     REACTION: unsure rxn    ROS As per HPI  PE: Blood pressure 139/81, pulse 73, height 5\' 5"  (1.651 m), weight 174 lb (78.926 kg). Gen: Alert, well appearing.  Patient is oriented to person, place, time, and situation. Affect: tearful at times when talking about her late mother in law, otherwise pleasant. ZOX:WRUE: no injection, icteris, swelling, or exudate.  EOMI, PERRLA. Nose: no drainage or turbinate edema/swelling.  No injection or focal lesion.  Mouth: lips without  lesion/swelling.  Oral mucosa pink and moist.   Oropharynx without erythema, exudate, or swelling.  Neck - No masses or thyromegaly or limitation in range of motion CV: RRR, no m/r/g.   LUNGS: CTA bilat, nonlabored resps, good aeration in all lung fields. BACK: diffuse TTP over lower lumbar spine region.  Pain in LB with strength testing in LE's but strength intact.  DTRs 2+ in patellar and achilles areas bilat.  LABS:  None today  IMPRESSION AND PLAN:  HYPERLIPIDEMIA Mixed. Tolerating crestor 10mg  and fenofibrate160 qd well. Recheck lipids, AST/ALT today since she has now been back on her fenofibrate since we last checked these things.  DIAB W/O COMP TYPE II/UNS NOT STATED UNCNTRL Home glucose checks are normal (fasting and 2H PP). Last HbA1c was 5.7% 10/2011.  Will recheck A1c next f/u in 19mo. She had eye check when she was seen for another eye issue late last year.  All other monitoring for DM 2 UTD. Pneumovax given today.  BACK PAIN, LUMBAR, CHRONIC Stable, takes 1-3 vicodin tabs per day--no sign of misuse or escalation of dose. RF given today.  Colon cancer screening iFob given today  (She is extremely apprehensive about colonoscopy and would prefer to screen this way for now).  Breast cancer screening Offered to set this up today but she wants to put it off until next visit b/c of what she's dealing with psychologically with the death of her mother in law currently.       FOLLOW UP: Return in about 4 months (around 06/22/2012) for f/u LBP, hyperlipidemia, anxiety.

## 2012-02-21 NOTE — Assessment & Plan Note (Signed)
Mixed. Tolerating crestor 10mg  and fenofibrate160 qd well. Recheck lipids, AST/ALT today since she has now been back on her fenofibrate since we last checked these things.

## 2012-03-03 ENCOUNTER — Telehealth: Payer: Self-pay | Admitting: Family Medicine

## 2012-03-03 NOTE — Telephone Encounter (Signed)
Pls notify patient that her labs on the 18th were EXCELLENT.  Continue all current doses of current meds.--thx

## 2012-03-03 NOTE — Telephone Encounter (Signed)
Pt notified and is agreeable with plan. 

## 2012-03-07 ENCOUNTER — Other Ambulatory Visit: Payer: Medicare Other

## 2012-03-07 DIAGNOSIS — Z1211 Encounter for screening for malignant neoplasm of colon: Secondary | ICD-10-CM

## 2012-03-07 LAB — FECAL OCCULT BLOOD, IMMUNOCHEMICAL: Fecal Occult Bld: NEGATIVE

## 2012-05-27 ENCOUNTER — Other Ambulatory Visit: Payer: Self-pay | Admitting: Family Medicine

## 2012-05-28 ENCOUNTER — Other Ambulatory Visit: Payer: Self-pay | Admitting: Family Medicine

## 2012-05-28 NOTE — Telephone Encounter (Signed)
eScribe request for refill on FENOFIBRATE Last seen on 02/21/12 Follow up 06/26/12 RX sent

## 2012-05-29 NOTE — Telephone Encounter (Signed)
eScribe request for refill on Lorazepam Last filled 02/21/12, #30 x 2 Last seen on 02/21/12 Follow up 06/26/12  eScribe request for refill on Flexeril for chronic back pain Last filled 10/24/12, #30 x 5 Last seen on 02/21/12 Follow up 06/26/12  Please advise refills.  Thanks!

## 2012-05-30 NOTE — Telephone Encounter (Signed)
RX faxed

## 2012-06-26 ENCOUNTER — Ambulatory Visit (HOSPITAL_COMMUNITY)
Admission: RE | Admit: 2012-06-26 | Discharge: 2012-06-26 | Disposition: A | Discharge status: Home / Self Care | Payer: Medicare Other | Source: Ambulatory Visit | Attending: Family Medicine | Admitting: Family Medicine

## 2012-06-26 ENCOUNTER — Encounter: Payer: Self-pay | Admitting: Family Medicine

## 2012-06-26 ENCOUNTER — Ambulatory Visit (INDEPENDENT_AMBULATORY_CARE_PROVIDER_SITE_OTHER): Payer: Medicare Other | Admitting: Family Medicine

## 2012-06-26 VITALS — BP 121/78 | HR 76 | Temp 97.4°F | Resp 16 | Ht 65.0 in | Wt 164.0 lb

## 2012-06-26 DIAGNOSIS — M25561 Pain in right knee: Secondary | ICD-10-CM

## 2012-06-26 DIAGNOSIS — M25569 Pain in unspecified knee: Secondary | ICD-10-CM | POA: Diagnosis not present

## 2012-06-26 DIAGNOSIS — M545 Low back pain: Secondary | ICD-10-CM | POA: Diagnosis not present

## 2012-06-26 DIAGNOSIS — E785 Hyperlipidemia, unspecified: Secondary | ICD-10-CM

## 2012-06-26 DIAGNOSIS — M1710 Unilateral primary osteoarthritis, unspecified knee: Secondary | ICD-10-CM | POA: Diagnosis not present

## 2012-06-26 DIAGNOSIS — E119 Type 2 diabetes mellitus without complications: Secondary | ICD-10-CM

## 2012-06-26 MED ORDER — HYDROCODONE-ACETAMINOPHEN 5-325 MG PO TABS: 1.0000 | ORAL_TABLET | Freq: Four times a day (QID) | ORAL | Status: DC | PRN

## 2012-06-26 NOTE — Assessment & Plan Note (Signed)
Suspect degenerative injury to medial meniscus, possibly advanced degenerative arthritis in medial compartment as well. Will check knee x-ray.  Offered pt knee joint steroid injection today but she declined and said she wanted to see what the knee x-ray shows first.

## 2012-06-26 NOTE — Assessment & Plan Note (Signed)
Diet controlled. Continues to do well. Due for HbA1c today.  All other diabetic monitoring UTD at this time.

## 2012-06-26 NOTE — Progress Notes (Signed)
OFFICE VISIT  06/26/2012   CC:  Chief Complaint  Patient presents with  . Follow Up    LBP, Hyperlipidemia, anxiety     HPI:    Patient is a 56 y.o. Caucasian female who presents for routine 4 mo f/u for anxiety, chronic LBP, diet-controlled DM 2, hyperlipidemia.   Husband had MI and got 3 stents July 18th.  She is dealing with the stress of this and at this time wants to continue putting off her mammogram.   Glucoses at home: 70-110 fasting.   Still smoking some, less than a pack a week. Pain medication use has been the same as usual: typically uses 3 pain pills per day, sometimes less.  Describes pain in medial portion of right knee ever since an episode of syncope in her yard last summer. Has had intermittent pain.   Has hurt this most recent time x 1 wk, kept her from sleeping last night.  +Has buckled but not locked up.  Worse with wt bearing.  Elevates it and applies ice and this helps.  Has had arthroscopic surgery on both knees in the past.    Past Medical History  Diagnosis Date  . Chronic LBP   . DDD (degenerative disc disease)     spinal stenosis  . Hyperlipidemia     mixed  . History of GI bleed     NSAIDS  . Recurrent UTI   . Microscopic hematuria     full w/ u unrevealing X 2  . Tobacco dependence     w/ only occasional bronchitis illness (CT shest 2009 nl- for f/u of ? nodule on CXR)  . Atypical chest pain 2007    cardiolyte neg, echo nl, cath showed mild/nonobstructive LAD disease  . Normal nuclear stress test 11/11    negative, EF normal preop  . Palpitations 2006    48H holter neg    Past Surgical History  Procedure Date  . Lumbar spine surgery 1993    right iliac crest bone graft+metal instrumentation; 2005 metal removal and decompression, 2011 lumbar decompression 4-11, then stabilization/ instrumentation done 09-19-10: L2,L3, L5 left and L2 , L3, L4 right pedical remnant L4 left embedded. Left iliac crest bone graft-- Dr Clotilde Dieter  . Appendectomy  1984  . Abdominal hysterectomy 1997    DUB  . Right oopherectomy     cyst  . Tonsillectomy 56 yrs old  . Knee arthroscopy     right and left  . Left wrist ganglion cyst excision 2010    Outpatient Prescriptions Prior to Visit  Medication Sig Dispense Refill  . acetaminophen (TYLENOL) 160 MG/5ML solution Take 325 mg by mouth every 4 (four) hours as needed.        Marland Kitchen albuterol (VENTOLIN HFA) 108 (90 BASE) MCG/ACT inhaler Inhale 2 puffs into the lungs every 6 (six) hours as needed for wheezing.  1 Inhaler  1  . cyclobenzaprine (FLEXERIL) 10 MG tablet take 1 tablet by mouth every 8 hours if needed FOR MUSCLE SPASMS  30 tablet  5  . esomeprazole (NEXIUM) 40 MG capsule Take 1 capsule (40 mg total) by mouth daily.  30 capsule  10  . fenofibrate 160 MG tablet take 1 tablet by mouth once daily with food  30 tablet  6  . LORazepam (ATIVAN) 1 MG tablet TAKE 1 TABLET 2 TIMES A DAY AS NEEDED FOR ANXIETY  30 tablet  2  . rosuvastatin (CRESTOR) 10 MG tablet Take 1 tablet (10 mg total) by  mouth daily.  30 tablet  5  . HYDROcodone-acetaminophen (VICODIN) 5-500 MG per tablet 1 tab po tid as needed for pain  90 tablet  3    Allergies  Allergen Reactions  . Amitriptyline Hcl     REACTION: increased heart rate  . Aspirin     REACTION: GI Bleed  . Beta Adrenergic Blockers     REACTION: decreased libido  . Nsaids     REACTION: GI Upset  . Penicillins     REACTION: swelling  . Prochlorperazine Edisylate     REACTION: Stroke like symptoms  . Sulfonamide Derivatives     REACTION: unsure rxn    ROS As per HPI  PE: Blood pressure 121/78, pulse 76, temperature 97.4 F (36.3 C), temperature source Temporal, resp. rate 16, height 5\' 5"  (1.651 m), weight 164 lb (74.39 kg), SpO2 96.00%. Gen: Alert, well appearing.  Patient is oriented to person, place, time, and situation. Knees: mild right knee warmth, without significant effusion or erythema.  Moderate TTP medial to patella and along entire medial  joint line.  Stretching/tensing the medial collateral ligament region causes pain medially and compression of medial joint line with McMurray's testing brings significant pain.  She has medial right knee pain with both passive and active extension and flexion.  Flexion and extension are not significantly limited, though.    LABS:  None today  IMPRESSION AND PLAN:  DIAB W/O COMP TYPE II/UNS NOT STATED UNCNTRL Diet controlled. Continues to do well. Due for HbA1c today.  All other diabetic monitoring UTD at this time.  HYPERLIPIDEMIA Stable/improved. Lab Results  Component Value Date   CHOL 163 02/21/2012   HDL 43 02/21/2012   LDLCALC 101* 02/21/2012   TRIG 93 02/21/2012   CHOLHDL 3.8 02/21/2012   Will f/u FLP after April 2014.  BACK PAIN, LUMBAR, CHRONIC Stable. RF'd pain med today, changed dose to vicodin 5/325 instead of 5/500 due to new dosing limitations that will take effect 2014.  Right knee pain Suspect degenerative injury to medial meniscus, possibly advanced degenerative arthritis in medial compartment as well. Will check knee x-ray.  Offered pt knee joint steroid injection today but she declined and said she wanted to see what the knee x-ray shows first.    FOLLOW UP: Return in about 4 months (around 10/26/2012) for f/u anxiety, diet controlled DM, hyperlipidemia, chronic LBP.

## 2012-06-26 NOTE — Assessment & Plan Note (Signed)
Stable. RF'd pain med today, changed dose to vicodin 5/325 instead of 5/500 due to new dosing limitations that will take effect 2014.

## 2012-06-26 NOTE — Assessment & Plan Note (Signed)
Stable/improved. Lab Results  Component Value Date   CHOL 163 02/21/2012   HDL 43 02/21/2012   LDLCALC 101* 02/21/2012   TRIG 93 02/21/2012   CHOLHDL 3.8 02/21/2012   Will f/u FLP after April 2014.

## 2012-06-30 ENCOUNTER — Encounter: Payer: Self-pay | Admitting: Family Medicine

## 2012-06-30 ENCOUNTER — Ambulatory Visit (INDEPENDENT_AMBULATORY_CARE_PROVIDER_SITE_OTHER): Payer: Medicare Other | Admitting: Family Medicine

## 2012-06-30 VITALS — BP 120/78 | HR 69 | Temp 96.6°F | Ht 65.0 in | Wt 167.0 lb

## 2012-06-30 DIAGNOSIS — M179 Osteoarthritis of knee, unspecified: Secondary | ICD-10-CM | POA: Insufficient documentation

## 2012-06-30 DIAGNOSIS — M170 Bilateral primary osteoarthritis of knee: Secondary | ICD-10-CM

## 2012-06-30 DIAGNOSIS — M1710 Unilateral primary osteoarthritis, unspecified knee: Secondary | ICD-10-CM | POA: Diagnosis not present

## 2012-06-30 HISTORY — DX: Bilateral primary osteoarthritis of knee: M17.0

## 2012-06-30 NOTE — Progress Notes (Signed)
OFFICE NOTE  06/30/2012  CC:  Chief Complaint  Patient presents with  . Injections    steroid injection in knee     HPI: Patient is a 56 y.o. Caucasian female who is here for right knee injection for recent acute knee pain. Recent right knee x-ray showed mild medial compartment osteoarthritis without acute abnormality. We discussed the risks/benefits of a steroid injection in knee joint today and she wants to proceed.  Pertinent PMH:  Past Medical History  Diagnosis Date  . Chronic LBP   . DDD (degenerative disc disease)     spinal stenosis  . Hyperlipidemia     mixed  . History of GI bleed     NSAIDS  . Recurrent UTI   . Microscopic hematuria     full w/ u unrevealing X 2  . Tobacco dependence     w/ only occasional bronchitis illness (CT shest 2009 nl- for f/u of ? nodule on CXR)  . Atypical chest pain 2007    cardiolyte neg, echo nl, cath showed mild/nonobstructive LAD disease  . Normal nuclear stress test 11/11    negative, EF normal preop  . Palpitations 2006    48H holter neg    MEDS:  Outpatient Prescriptions Prior to Visit  Medication Sig Dispense Refill  . acetaminophen (TYLENOL) 160 MG/5ML solution Take 325 mg by mouth every 4 (four) hours as needed.        Marland Kitchen albuterol (VENTOLIN HFA) 108 (90 BASE) MCG/ACT inhaler Inhale 2 puffs into the lungs every 6 (six) hours as needed for wheezing.  1 Inhaler  1  . cyclobenzaprine (FLEXERIL) 10 MG tablet take 1 tablet by mouth every 8 hours if needed FOR MUSCLE SPASMS  30 tablet  5  . esomeprazole (NEXIUM) 40 MG capsule Take 1 capsule (40 mg total) by mouth daily.  30 capsule  10  . fenofibrate 160 MG tablet take 1 tablet by mouth once daily with food  30 tablet  6  . HYDROcodone-acetaminophen (NORCO/VICODIN) 5-325 MG per tablet Take 1 tablet by mouth every 6 (six) hours as needed for pain.  90 tablet  3  . LORazepam (ATIVAN) 1 MG tablet TAKE 1 TABLET 2 TIMES A DAY AS NEEDED FOR ANXIETY  30 tablet  2  . rosuvastatin  (CRESTOR) 10 MG tablet Take 1 tablet (10 mg total) by mouth daily.  30 tablet  5    PE: Blood pressure 120/78, pulse 69, temperature 96.6 F (35.9 C), temperature source Temporal, height 5\' 5"  (1.651 m), weight 167 lb (75.751 kg). Gen: Alert, well appearing.  Patient is oriented to person, place, time, and situation. Right knee: no erythema, warmth, or swelling.  Tender medial to patella and along medial joint line.  IMPRESSION AND PLAN:  Right knee pain/osteoarthritis.  Procedure: Therapeutic right knee injection.  The patient's clinical condition is marked by substantial pain and/or significant functional disability.  Other conservative therapy has not provided relief, is contraindicated, or not appropriate.  There is a reasonable likelihood that injection will significantly improve the patient's pain and/or functional disability. Cleaned skin with alcohol swab, used lateral approach to enter knee joint in two separate spots but met bony resistance each time.  The same thing occurred with medial approach.  I then had her sit up and I was able to enter the joint via anterior approach just medial to patellar tendon and inferior to lower edge of patella,  Injected 1ml of depomedrol 40/5 + 2 ml of 2% lidocaine  without epi into joint space without resistance.  No immediate complications.  Patient tolerated procedure well.  Post-injection care discussed, including 20 min of icing 1-2 times in the next 4-8 hours, frequent non weight-bearing ROM exercises over the next few days, and general pain medication management.  FOLLOW UP: prn

## 2012-08-21 ENCOUNTER — Other Ambulatory Visit: Payer: Self-pay | Admitting: Family Medicine

## 2012-08-21 NOTE — Telephone Encounter (Signed)
eScribe request for refill on Crestor Last filled -  Last seen on - 06/26/12 Follow up - 10/27/12 RX sent

## 2012-10-16 ENCOUNTER — Other Ambulatory Visit: Payer: Self-pay | Admitting: Family Medicine

## 2012-10-16 NOTE — Telephone Encounter (Signed)
RX faxed

## 2012-10-16 NOTE — Telephone Encounter (Signed)
eScribe request for refill on LORAZEPAM Last filled - 05/28/12, #30 X 2 Last seen on - 06/30/12 Follow up - 10/27/12 Please advise refills.

## 2012-10-16 NOTE — Telephone Encounter (Signed)
eScribe request for refill on HYDROCODONE Last filled - 06/26/12, #90 X 3 Last seen on - 06/30/12 Follow up - 10/27/12 Please advise refills.

## 2012-10-27 ENCOUNTER — Ambulatory Visit: Payer: Medicare Other | Admitting: Family Medicine

## 2012-10-30 ENCOUNTER — Ambulatory Visit (INDEPENDENT_AMBULATORY_CARE_PROVIDER_SITE_OTHER): Payer: Medicare Other | Admitting: Family Medicine

## 2012-10-30 ENCOUNTER — Encounter: Payer: Self-pay | Admitting: Family Medicine

## 2012-10-30 VITALS — BP 131/79 | HR 72 | Temp 97.0°F | Wt 167.0 lb

## 2012-10-30 DIAGNOSIS — E785 Hyperlipidemia, unspecified: Secondary | ICD-10-CM

## 2012-10-30 DIAGNOSIS — E119 Type 2 diabetes mellitus without complications: Secondary | ICD-10-CM | POA: Diagnosis not present

## 2012-10-30 DIAGNOSIS — F17200 Nicotine dependence, unspecified, uncomplicated: Secondary | ICD-10-CM

## 2012-10-30 DIAGNOSIS — Z23 Encounter for immunization: Secondary | ICD-10-CM | POA: Diagnosis not present

## 2012-10-30 DIAGNOSIS — M545 Low back pain, unspecified: Secondary | ICD-10-CM

## 2012-10-30 DIAGNOSIS — M25561 Pain in right knee: Secondary | ICD-10-CM

## 2012-10-30 DIAGNOSIS — M25569 Pain in unspecified knee: Secondary | ICD-10-CM | POA: Diagnosis not present

## 2012-10-30 NOTE — Assessment & Plan Note (Signed)
Minimal per pt report, but I still encouraged her to completely quit.  This will be especially helpful for healing potential if she is to get knee surgery.

## 2012-10-30 NOTE — Assessment & Plan Note (Signed)
Mixed. FLP and AST/ALT today.

## 2012-10-30 NOTE — Assessment & Plan Note (Signed)
Stable.  Will check HbA1c, urine microalbumin/cr, and will do foot exam at next f/u visit.

## 2012-10-30 NOTE — Assessment & Plan Note (Signed)
Suspect medial meniscus pathology and possibly medial collateral ligament pathology.   Her pain is getting disabling.  Will proceed with MRI of right knee.

## 2012-10-30 NOTE — Assessment & Plan Note (Signed)
Stable. No new meds today.

## 2012-10-30 NOTE — Progress Notes (Signed)
OFFICE NOTE  10/30/2012  CC:  Chief Complaint  Patient presents with  . Follow-up  . Hyperlipidemia  . Diabetes     HPI: Patient is a 56 y.o. Caucasian female who is here for routine f/u of her diet controlled DM 2, GERD, chronic low back pain, hyperlipidemia, anxiety, and her tobacco dependence.   Compliant with meds, still taking same amount of pain pills per day.  LBP includes a bit of chronic left leg radiculopathy sx's that have been present still since her last back surgery.   Tries to adhere to low chol/low fat diet and tries to watch simple carbs.  HbA1c's have been 5.8, 5.7 the last 2 checks--most recently about 4 mo ago.  No exercise, primarily due to worsening right knee pain. We x-rayed the knee last visit and it showed mild osteoarthritis. She still smokes but says "only here and there". She is fasting today.  Pertinent PMH:  Past Medical History  Diagnosis Date  . Chronic LBP   . DDD (degenerative disc disease)     spinal stenosis  . Hyperlipidemia     mixed  . History of GI bleed     NSAIDS  . Recurrent UTI   . Microscopic hematuria     full w/ u unrevealing X 2  . Tobacco dependence     w/ only occasional bronchitis illness (CT shest 2009 nl- for f/u of ? nodule on CXR)  . Atypical chest pain 2007    cardiolyte neg, echo nl, cath showed mild/nonobstructive LAD disease  . Normal nuclear stress test 11/11    negative, EF normal preop  . Palpitations 2006    48H holter neg  . Osteoarthritis of knee 06/30/2012    Right    Past surgical, social, and family history reviewed and no changes noted since last office visit.  MEDS:  Outpatient Prescriptions Prior to Visit  Medication Sig Dispense Refill  . acetaminophen (TYLENOL) 160 MG/5ML solution Take 325 mg by mouth every 4 (four) hours as needed.        Marland Kitchen albuterol (VENTOLIN HFA) 108 (90 BASE) MCG/ACT inhaler Inhale 2 puffs into the lungs every 6 (six) hours as needed for wheezing.  1 Inhaler  1  . CRESTOR  10 MG tablet take 1 tablet by mouth once daily  30 each  5  . cyclobenzaprine (FLEXERIL) 10 MG tablet take 1 tablet by mouth every 8 hours if needed FOR MUSCLE SPASMS  30 tablet  5  . esomeprazole (NEXIUM) 40 MG capsule Take 1 capsule (40 mg total) by mouth daily.  30 capsule  10  . fenofibrate 160 MG tablet take 1 tablet by mouth once daily with food  30 tablet  6  . HYDROcodone-acetaminophen (NORCO/VICODIN) 5-325 MG per tablet TAKE 1 TABLET BY MOUTH EVERY 6 HOURS AS NEEDED FOR PAIN  90 tablet  3  . LORazepam (ATIVAN) 1 MG tablet TAKE 1 TABLET 2 TIMES A DAY AS NEEDED FOR ANXIETY  30 tablet  3  Last reviewed on 06/30/2012  3:17 PM by Jeoffrey Massed, MD  PE: Blood pressure 131/79, pulse 72, temperature 97 F (36.1 C), weight 167 lb (75.751 kg), SpO2 96.00%. Gen: Alert, well appearing.  Patient is oriented to person, place, time, and situation. Right knee: No swelling, warmth, or erythema.  Painful knee extension and flexion.  Extends to 180 but flexes only to 90 degrees. Tenderness to palpation medially.  Extreme pain in medial joint line with FABER maneuver.  Pain  with stressing of the medial collateral ligament.    IMPRESSION AND PLAN:  Right knee pain Suspect medial meniscus pathology and possibly medial collateral ligament pathology.   Her pain is getting disabling.  Will proceed with MRI of right knee.  HYPERLIPIDEMIA Mixed. FLP and AST/ALT today.  DIAB W/O COMP TYPE II/UNS NOT STATED UNCNTRL Stable.  Will check HbA1c, urine microalbumin/cr, and will do foot exam at next f/u visit.  BACK PAIN, LUMBAR, CHRONIC Stable. No new meds today.  TOBACCO ABUSE Minimal per pt report, but I still encouraged her to completely quit.  This will be especially helpful for healing potential if she is to get knee surgery.  Flu vaccine IM today.  An After Visit Summary was printed and given to the patient.  FOLLOW UP:  66mo

## 2012-10-31 LAB — LIPID PANEL
LDL Cholesterol: 78 mg/dL (ref 0–99)
Total CHOL/HDL Ratio: 3

## 2012-10-31 LAB — AST: AST: 17 U/L (ref 0–37)

## 2012-10-31 LAB — ALT: ALT: 13 U/L (ref 0–35)

## 2012-11-07 ENCOUNTER — Telehealth: Payer: Self-pay | Admitting: Family Medicine

## 2012-11-07 NOTE — Telephone Encounter (Signed)
Patient Information:  Caller Name: Dashanna  Phone: [phone]  Patient: Brenda [name]  Gender: Female  DOB: 08/05/1956  Age: 57 Years  PCP: Earley Favor [facility])  Office Follow Up:  Does the office need to follow up with this patient?: No  Instructions For The Office: N/A  RN Note:  States has appt with dentist but not until next week.  Right cheekbone is painful as well.  Afebrile.  Per protocol, advised appt within 24 hours; offered appt in AM at [facility].  Declines; states she will "manage till seen by dentist."  krs/can  Symptoms  Reason For Call & Symptoms: toothache  Reviewed Health History In EMR: Yes  Reviewed Medications In EMR: Yes  Reviewed Allergies In EMR: Yes  Reviewed Surgeries / Procedures: Yes  Date of Onset of Symptoms: 11/05/2012  Guideline(s) Used:  Toothache  Disposition Per Guideline:   Call Dentist Today  Reason For Disposition Reached:   Patient wants to be seen  Advice Given:  N/A

## 2012-11-10 ENCOUNTER — Ambulatory Visit (HOSPITAL_COMMUNITY): Payer: Medicare Other

## 2012-11-10 NOTE — Telephone Encounter (Signed)
FYI

## 2012-11-12 ENCOUNTER — Ambulatory Visit (HOSPITAL_COMMUNITY)
Admission: RE | Admit: 2012-11-12 | Discharge: 2012-11-12 | Disposition: A | Discharge status: Home / Self Care | Payer: Medicare Other | Source: Ambulatory Visit | Attending: Family Medicine | Admitting: Family Medicine

## 2012-11-12 DIAGNOSIS — M7120 Synovial cyst of popliteal space [Baker], unspecified knee: Secondary | ICD-10-CM | POA: Insufficient documentation

## 2012-11-12 DIAGNOSIS — M25569 Pain in unspecified knee: Secondary | ICD-10-CM | POA: Insufficient documentation

## 2012-11-12 DIAGNOSIS — M25561 Pain in right knee: Secondary | ICD-10-CM

## 2012-11-12 DIAGNOSIS — M25469 Effusion, unspecified knee: Secondary | ICD-10-CM | POA: Diagnosis not present

## 2012-11-12 DIAGNOSIS — M1710 Unilateral primary osteoarthritis, unspecified knee: Secondary | ICD-10-CM | POA: Diagnosis not present

## 2012-11-26 ENCOUNTER — Other Ambulatory Visit: Payer: Self-pay | Admitting: Family Medicine

## 2012-11-26 NOTE — Telephone Encounter (Signed)
eScribe request for refill on CYCLOBENZAPRINE Last filled - 05/28/12, #30 X 5 Last seen on - 10/30/12 Follow up - 4 MONTHS Please advise refills.

## 2012-11-26 NOTE — Telephone Encounter (Signed)
Please advise? Last RX was wrote on 05-28-12 quantity 30 with 5 refills

## 2012-12-16 ENCOUNTER — Other Ambulatory Visit: Payer: Self-pay | Admitting: Family Medicine

## 2013-01-01 ENCOUNTER — Telehealth: Payer: Self-pay | Admitting: Family Medicine

## 2013-01-01 DIAGNOSIS — G8929 Other chronic pain: Secondary | ICD-10-CM

## 2013-01-01 NOTE — Telephone Encounter (Signed)
The surgeon that performed surgery on patient's knee is retired. Patient is requesting a referral to be set up with Dr. Romeo Apple in [place]. She can go M-Thurs anytime after 1:00.

## 2013-01-01 NOTE — Telephone Encounter (Signed)
Please order new referral.  MRI results say we will enter new order if needed.

## 2013-01-07 NOTE — Telephone Encounter (Signed)
Referral to Dr. Duffy Rhody ordered.

## 2013-01-23 ENCOUNTER — Other Ambulatory Visit: Payer: Self-pay | Admitting: Family Medicine

## 2013-01-26 NOTE — Telephone Encounter (Signed)
RX FAXED.

## 2013-02-26 ENCOUNTER — Encounter: Payer: Self-pay | Admitting: Family Medicine

## 2013-02-26 ENCOUNTER — Ambulatory Visit (INDEPENDENT_AMBULATORY_CARE_PROVIDER_SITE_OTHER): Payer: Medicare Other | Admitting: Family Medicine

## 2013-02-26 VITALS — BP 121/80 | HR 86 | Temp 98.1°F | Resp 18 | Wt 167.2 lb

## 2013-02-26 DIAGNOSIS — M25561 Pain in right knee: Secondary | ICD-10-CM

## 2013-02-26 DIAGNOSIS — E119 Type 2 diabetes mellitus without complications: Secondary | ICD-10-CM

## 2013-02-26 DIAGNOSIS — F411 Generalized anxiety disorder: Secondary | ICD-10-CM | POA: Diagnosis not present

## 2013-02-26 DIAGNOSIS — J449 Chronic obstructive pulmonary disease, unspecified: Secondary | ICD-10-CM

## 2013-02-26 DIAGNOSIS — F17200 Nicotine dependence, unspecified, uncomplicated: Secondary | ICD-10-CM

## 2013-02-26 DIAGNOSIS — E785 Hyperlipidemia, unspecified: Secondary | ICD-10-CM

## 2013-02-26 DIAGNOSIS — M25569 Pain in unspecified knee: Secondary | ICD-10-CM

## 2013-02-26 MED ORDER — ALBUTEROL SULFATE HFA 108 (90 BASE) MCG/ACT IN AERS: 2.0000 | INHALATION_SPRAY | Freq: Four times a day (QID) | RESPIRATORY_TRACT | Status: DC | PRN

## 2013-02-26 MED ORDER — LORAZEPAM 1 MG PO TABS: ORAL_TABLET | ORAL | Status: DC

## 2013-02-26 NOTE — Progress Notes (Signed)
OFFICE NOTE  02/26/2013  CC:  Chief Complaint  Patient presents with  . Follow-up    4-mth. [DM; Hyperlipidemia]     HPI: Patient is a 57 y.o. Caucasian female who is here for 4 mo f/u hyperlipidemia, diet-controlled DM 2, tob abuse, and anxiety. Doing pretty well except her exercise capacity is limited quite a bit by chronic right knee pain, for which she has ortho eval set up. She has cut back to about 4 cigs a day.   Eating a prudent, heart healthy/diabetic diet.  Fasting gluc's 70s-90s, only checking occasionally.  Postprandial--the last she can recall was 110.  She uses her inhaler infrequently except for recently when she had a bout of bronchitis--says she is over this now but needs a rf on her inhaler.    She is feeling stable regarding her anxiety as long as she takes her ativan.  ROS: no CP, no HAs, no myalgias, no fevers.  She does have hay fever problems lately. Pertinent PMH:  Past Medical History  Diagnosis Date  . Chronic LBP   . DDD (degenerative disc disease)     spinal stenosis  . Hyperlipidemia     mixed  . History of GI bleed     NSAIDS  . Recurrent UTI   . Microscopic hematuria     full w/ u unrevealing X 2  . Tobacco dependence     w/ only occasional bronchitis illness (CT shest 2009 nl- for f/u of ? nodule on CXR)  . Atypical chest pain 2007    cardiolyte neg, echo nl, cath showed mild/nonobstructive LAD disease  . Normal nuclear stress test 11/11    negative, EF normal preop  . Palpitations 2006    48H holter neg  . Osteoarthritis of knee 06/30/2012    Right    Past Surgical History  Procedure Laterality Date  . Lumbar spine surgery  1993    right iliac crest bone graft+metal instrumentation; 2005 metal removal and decompression, 2011 lumbar decompression 4-11, then stabilization/ instrumentation done 09-19-10: L2,L3, L5 left and L2 , L3, L4 right pedical remnant L4 left embedded. Left iliac crest bone graft-- Dr Clotilde Dieter  . Appendectomy  1984   . Abdominal hysterectomy  1997    DUB  . Right oopherectomy      cyst  . Tonsillectomy  57 yrs old  . Knee arthroscopy      right and left  . Left wrist ganglion cyst excision  2010    MEDS:  Outpatient Prescriptions Prior to Visit  Medication Sig Dispense Refill  . acetaminophen (TYLENOL) 160 MG/5ML solution Take 325 mg by mouth every 4 (four) hours as needed.        Marland Kitchen albuterol (VENTOLIN HFA) 108 (90 BASE) MCG/ACT inhaler Inhale 2 puffs into the lungs every 6 (six) hours as needed for wheezing.  1 Inhaler  1  . CRESTOR 10 MG tablet take 1 tablet by mouth once daily  30 each  5  . cyclobenzaprine (FLEXERIL) 10 MG tablet TAKE 1 TABLET BY MOUTH EVERY 8 HOURS AS NEEDED FOR MUSCLE SPASMS  30 tablet  5  . esomeprazole (NEXIUM) 40 MG capsule Take 1 capsule (40 mg total) by mouth daily.  30 capsule  10  . fenofibrate 160 MG tablet take 1 tablet by mouth once daily with food  30 tablet  6  . HYDROcodone-acetaminophen (NORCO/VICODIN) 5-325 MG per tablet take 1 tablet by mouth every 6 hours if needed for pain  90 tablet  3  . LORazepam (ATIVAN) 1 MG tablet TAKE 1 TABLET 2 TIMES A DAY AS NEEDED FOR ANXIETY  30 tablet  3   No facility-administered medications prior to visit.    PE: Blood pressure 121/80, pulse 86, temperature 98.1 F (36.7 C), temperature source Oral, resp. rate 18, weight 167 lb 4 oz (75.864 kg), SpO2 95.00%. Gen: Alert, well appearing.  Patient is oriented to person, place, time, and situation. No further exam today.  IMPRESSION AND PLAN:  1) DM 2, diet-controlled.  Stable.   Lab Results  Component Value Date   HGBA1C 5.8 06/26/2012   Will repeat HbA1c, urine microalbumin/cr, and do annual foot exam at next f/u in 1mo.  2) Hyperlipidemia.  Stable.  Continue current meds and diet.  Repeat FLP at next f/u in 1mo. Lab Results  Component Value Date   CHOL 137 10/30/2012   HDL 43.60 10/30/2012   LDLCALC 78 10/30/2012   TRIG 79.0 10/30/2012   CHOLHDL 3 10/30/2012    3) COPD, with ongoing tobacco dependence. She is cutting back well but I encouraged complete cessation. Continue albuterol inhaler prn.  4) Chronic right knee pain: has appt with orthopedist in [place], Dr. Romeo Apple, on 03/11/13.  5) Anxiety: stable.  RF'd ativan today.   FOLLOW UP: 1mo (fasting CPE with labs)

## 2013-03-03 DIAGNOSIS — J449 Chronic obstructive pulmonary disease, unspecified: Secondary | ICD-10-CM | POA: Insufficient documentation

## 2013-03-03 DIAGNOSIS — F411 Generalized anxiety disorder: Secondary | ICD-10-CM | POA: Insufficient documentation

## 2013-03-11 ENCOUNTER — Ambulatory Visit (INDEPENDENT_AMBULATORY_CARE_PROVIDER_SITE_OTHER): Payer: Medicare Other | Admitting: Orthopedic Surgery

## 2013-03-11 ENCOUNTER — Encounter: Payer: Self-pay | Admitting: Orthopedic Surgery

## 2013-03-11 VITALS — BP 110/68 | Ht 64.0 in | Wt 167.0 lb

## 2013-03-11 DIAGNOSIS — Z9889 Other specified postprocedural states: Secondary | ICD-10-CM

## 2013-03-11 DIAGNOSIS — M5137 Other intervertebral disc degeneration, lumbosacral region: Secondary | ICD-10-CM | POA: Diagnosis not present

## 2013-03-11 DIAGNOSIS — M1710 Unilateral primary osteoarthritis, unspecified knee: Secondary | ICD-10-CM

## 2013-03-11 DIAGNOSIS — M5136 Other intervertebral disc degeneration, lumbar region: Secondary | ICD-10-CM | POA: Insufficient documentation

## 2013-03-11 DIAGNOSIS — M1711 Unilateral primary osteoarthritis, right knee: Secondary | ICD-10-CM | POA: Insufficient documentation

## 2013-03-11 DIAGNOSIS — M51369 Other intervertebral disc degeneration, lumbar region without mention of lumbar back pain or lower extremity pain: Secondary | ICD-10-CM | POA: Insufficient documentation

## 2013-03-11 NOTE — Patient Instructions (Addendum)
Wear brace  You have received a steroid shot. 15% of patients experience increased pain at the injection site with in the next 24 hours. This is best treated with ice and tylenol extra strength 2 tabs every 8 hours. If you are still having pain please call the office.

## 2013-03-11 NOTE — Progress Notes (Signed)
Patient ID: Brenda [name], female   DOB: Jan 10, 1956, 57 y.o.   MRN: [id number] Chief Complaint  Patient presents with  . Knee Pain    right knee pain     The patient reports pain in the right knee x1 year. She had an arthroscopy 9 years ago apparently did well. She's had 3 spine surgeries the most recent one for decompression she's had a hysterectomy she had arthroscopy of the left knee as well.  Her spine surgery was done for leg pain back pain and radicular pain.  She now complains of sharp throbbing anterior and medial, primarily medial knee pain which is usually a 3/6 but occasionally becomes 6/10. She is concerned because she needs to ambulate and walk for her back condition and when her knee is hurting she cannot do that. She did get a cortisone injection 4 months ago she said it did not do any good she's not had any oral anti-inflammatories most likely because of her allergies to anti-inflammatory drugs she's not had any physical therapy or bracing  She feels like a catching sensation as if the knees can give way and this is exacerbated by pivoting  Her review of systems is negative for weight loss weight gain fever chills blurred vision chest pain or shortness of breath she does complain of heartburn, blood in the urine, numbness and tingling with unsteady gait denies skin changes poor healing or rash denies easy bleeding or bruising denies excessive thirst she does have some seasonal allergies  Her medical problems are listed as none although she takes Nexium and Crestor  Family history of heart disease, cancer, diabetes, kidney disease and arthritis  She is married  She is disabled secondary to the 3 back surgeries.  Smoking 5-6 cigarettes a day. Does not drink. No caffeine use. Is grade completed was 12 No St. drugs  BP 110/68  Ht 5\' 4"  (1.626 m)  Wt 167 lb (75.751 kg)  BMI 28.65 kg/m2 General appearance is normal, the patient is alert and oriented x3 with normal mood and  affect. Body habitus me some more. She is oriented x3 Her mood and affect are pleasant She ambulates with no limp today  Left leg knee looks pretty good range of motion is good stability is normal strength is normal skin is normal McMurray sign is negative there is no effusion  Right knee looks the same but she has medial joint line tenderness medial femoral condylar tenderness medial tibial proximal tenderness and crepitance in the patellofemoral joint no effusion full range of motion McMurray sign negative collateral ligaments and cruciate ligaments stable skin intact  Distal pulses are good bilaterally sensation is normal bilaterally and balance is normal.  Upper extremity exam  The right and left upper extremity:   Inspection revealed no abnormalities in the upper extremities.   No contracture.  Muscle tone is normal   The joints are fully reduced without subluxation.  Previous MRI was done prior to surgery MRI postsurgical MRI operative note from 2005 at that time she had a partial medial partial lateral meniscectomy plica resection and abrasion medial femoral condyle previous or prior MRI that showed torn meniscus  Recent x-ray shows mild arthritis I did get to see that one and I saw her recent MRI which shows chondral degeneration medial femoral condyle with remnants of previous medial meniscectomy. Primarily involving posterior horn remaining meniscus intact  Encounter Diagnoses  Name Primary?  . Degenerative arthritis of right knee Yes  . Degenerative disc  disease, lumbar   . H/O Spinal surgery     I spent extra time counseling her on treatment for her problem which is primarily arthritic knee pain  I would like to use a stepwise approach so I repeated her injection and put her in a knee brace I started process of trying to get cartilage injection Orth[facility]isc for her to return in a month depending on her symptoms we may proceed with this  If this doesn't work then I  would re\re scope her knee  If that doesn't work then we can discuss knee replacement surgery. I've told her that I have not experienced good results post-lumbar fusion chronic back pain with knee replacement because of inability to perform the therapy. I spent a lot of time trying to emphasize this.

## 2013-03-13 ENCOUNTER — Telehealth: Payer: Self-pay | Admitting: Orthopedic Surgery

## 2013-03-13 NOTE — Telephone Encounter (Addendum)
[facility] request faxed, per nurse, Tammy; patient aware of need for approval by insurer; she is aware that primary insurer, Medicare, may not cover.  Patient's prescription, Plan D insurer, [employer], insurance included with [facility] fax.

## 2013-04-09 ENCOUNTER — Ambulatory Visit (INDEPENDENT_AMBULATORY_CARE_PROVIDER_SITE_OTHER): Payer: Medicare Other | Admitting: Orthopedic Surgery

## 2013-04-09 VITALS — BP 102/70 | Ht 64.0 in | Wt 167.0 lb

## 2013-04-09 DIAGNOSIS — M1710 Unilateral primary osteoarthritis, unspecified knee: Secondary | ICD-10-CM

## 2013-04-09 DIAGNOSIS — M1711 Unilateral primary osteoarthritis, right knee: Secondary | ICD-10-CM

## 2013-04-09 DIAGNOSIS — IMO0002 Reserved for concepts with insufficient information to code with codable children: Secondary | ICD-10-CM

## 2013-04-09 MED ORDER — HYDROCODONE-ACETAMINOPHEN 7.5-325 MG PO TABS: 1.0000 | ORAL_TABLET | ORAL | Status: DC | PRN

## 2013-04-09 NOTE — Patient Instructions (Signed)
Try to get out of pocket cost for orthovisc and 1 shot version of synvisc

## 2013-04-10 ENCOUNTER — Encounter: Payer: Self-pay | Admitting: Orthopedic Surgery

## 2013-04-10 DIAGNOSIS — M179 Osteoarthritis of knee, unspecified: Secondary | ICD-10-CM | POA: Insufficient documentation

## 2013-04-10 DIAGNOSIS — M1710 Unilateral primary osteoarthritis, unspecified knee: Secondary | ICD-10-CM | POA: Insufficient documentation

## 2013-04-10 NOTE — Progress Notes (Signed)
Patient ID: Brenda [name], female   DOB: 1956-05-17, 57 y.o.   MRN: [id number] Chief Complaint  Patient presents with  . Follow-up    4 week recheck right knee   BP 102/70  Ht 5\' 4"  (1.626 m)  Wt 167 lb (75.751 kg)  BMI 28.65 kg/m2 Encounter Diagnoses  Name Primary?  Marland Kitchen Arthritis of knee, degenerative Yes  . Degenerative arthritis of right knee    Patient presents back after injection of her right knee with really no improvement. She complains of severe medial knee pain. During her examination she became tearful from slight palpation over the medial joint line. I informed her and her husband that I do not think she is a good candidate for knee replacement because her response to pain is not conducive to a good result. I did offer her Orthovisc or Synvisc injection and they agree to be out of pocket. We tried to get insurance approval it was declined.  She is also a candidate for possible knee arthroscopy.  General appearance is normal, the patient is alert and oriented x3 with normal mood and affect. She has a history of lumbar disc disease but she denies any lumbar disc related pain in her right leg. She is tender over the medial joint line but it is excessive. The tibial plateau is tender as well. She has good flexion and extension in her knee her knee is stable her McMurray sign is questionable but more likely negative. Skin is intact no rash no lesion no ulcer  At this point we are going to consider out of pocket payment for hyaluronic acid injection,we will also contact us at arthroscopic surgery. Again I have emphasized to them that her pain response indicates that she would not be a good candidate for knee replacement surgery

## 2013-04-28 ENCOUNTER — Telehealth: Payer: Self-pay | Admitting: Orthopedic Surgery

## 2013-04-28 NOTE — Telephone Encounter (Signed)
Brenda [name], Levette wants you to call her at [phone], has question about her knee

## 2013-04-28 NOTE — Telephone Encounter (Signed)
Returned call to patient, and she is still in a lot of pain and hasn't heard anything from [employer]. I ask her if she wanted me to call them again and she is asking if you can just go ahead and do the arthroscopy to clean it out? States she has got to have some relief. She normally walks to help her back, however with the knee pain she can't walk. Please advise.

## 2013-04-29 NOTE — Telephone Encounter (Signed)
I called [facility] and they relayed to me that,  they called patient on 04/17/13 and left a message. Also, they stated a statement was sent out to the patient's home from the specialty pharmacy. The patient is still denying receiving any message or statement. I gave the patient the phone number to the specialty pharmacy to call and make arrangements for the Orthovisc injections. The patient said she will call them once she gets the money.

## 2013-04-29 NOTE — Telephone Encounter (Signed)
What was the cost of the orthovisc?? Did we offer it to them at cost ???

## 2013-05-14 ENCOUNTER — Other Ambulatory Visit: Payer: Self-pay

## 2013-06-18 ENCOUNTER — Other Ambulatory Visit: Payer: Self-pay | Admitting: Family Medicine

## 2013-06-29 ENCOUNTER — Other Ambulatory Visit: Payer: Self-pay | Admitting: Family Medicine

## 2013-06-29 NOTE — Telephone Encounter (Signed)
Patient requesting flexeril refill.  Patient last seen 4/24.  Next OV 10/23.  Medication last filled 1/22 x 5 refills.  Please advise.

## 2013-06-30 ENCOUNTER — Other Ambulatory Visit: Payer: Self-pay | Admitting: Family Medicine

## 2013-06-30 NOTE — Telephone Encounter (Signed)
Refill request for Norco/Vicodin Last filled by MD on - 01/23/13 #90 x3 Last Seen- 02/26/13 Next Appt: 08/27/13 Please advise refill?

## 2013-07-22 ENCOUNTER — Telehealth: Payer: Self-pay | Admitting: Family Medicine

## 2013-07-22 NOTE — Telephone Encounter (Signed)
Patient Information:  Caller Name: Brenda [name]  Phone: [phone]  Patient: Brenda [name], Brenda [name]  Gender: Female  DOB: [birth year]  Age: 57 Years  PCP: Earley Favor [facility])  Office Follow Up:  Does the office need to follow up with this patient?: Yes  Instructions For The Office: Has go to office now disposition per colds protocol due to fever >100.5 and is borderline diabetic controlled with diet. No appts available in EPIC. Is not familiar with other [place] offices   Symptoms  Reason For Call & Symptoms: TRUE states she has a sore throat and stuffy nose onset 07/20/13. Has intermittent cough onset 07/20/13.  Onset of oral Temp101.8 @ 2230 on 07/21/13.Main discomfort is nasal congestion and stuffy nose- unable to sleep. Oral temp 100.2 at 10:00. Oral Temp 98.6 @ 14:03. Per colds protocol has go to office now due to fever > 100.5 and is borderline diabetic. No appt available for 9/17 in EPIC. Brenda [name] is not familiar with other [place] locations. Please call [place] at [phone]  Reviewed Health History In EMR: Yes  Reviewed Medications In EMR: Yes  Reviewed Allergies In EMR: Yes  Reviewed Surgeries / Procedures: Yes  Date of Onset of Symptoms: 07/20/2013  Treatments Tried: Humidifier, Nasal spray, Allegra, Steam from shower  Treatments Tried Worked: No  Any Fever: Yes  Fever Taken: Oral  Fever Time Of Reading: 22:30:00  Fever Last Reading: 101.8  Guideline(s) Used:  Colds  Disposition Per Guideline:   Go to Office Now  Reason For Disposition Reached:   Fever > 100.5 F (38.1 C) and has diabetes mellitus or a weak immune system (e.g., HIV positive, cancer chemotherapy, organ transplant, splenectomy, chronic steroids)  Advice Given:  N/A  Patient Will Follow Care Advice:  YES

## 2013-08-03 ENCOUNTER — Ambulatory Visit (INDEPENDENT_AMBULATORY_CARE_PROVIDER_SITE_OTHER): Payer: Medicare Other | Admitting: Family Medicine

## 2013-08-03 ENCOUNTER — Encounter: Payer: Self-pay | Admitting: Family Medicine

## 2013-08-03 VITALS — BP 140/82 | HR 84 | Temp 98.7°F | Resp 20 | Ht 65.0 in | Wt 175.0 lb

## 2013-08-03 DIAGNOSIS — J441 Chronic obstructive pulmonary disease with (acute) exacerbation: Secondary | ICD-10-CM | POA: Diagnosis not present

## 2013-08-03 MED ORDER — METHYLPREDNISOLONE ACETATE 80 MG/ML IJ SUSP
80.0000 mg | Freq: Once | INTRAMUSCULAR | Status: AC
  Administered 2013-08-03: 80 mg via INTRAMUSCULAR

## 2013-08-03 MED ORDER — IPRATROPIUM-ALBUTEROL 0.5-2.5 (3) MG/3ML IN SOLN
3.0000 mL | Freq: Once | RESPIRATORY_TRACT | Status: AC
Start: 2013-08-03 — End: 2013-08-03
  Administered 2013-08-03: 3 mL via RESPIRATORY_TRACT

## 2013-08-03 MED ORDER — HYDROCODONE-HOMATROPINE 5-1.5 MG/5ML PO SYRP: ORAL_SOLUTION | ORAL | Status: DC

## 2013-08-03 MED ORDER — PREDNISONE 20 MG PO TABS: ORAL_TABLET | ORAL | Status: DC

## 2013-08-03 MED ORDER — LEVOFLOXACIN 750 MG PO TABS: 750.0000 mg | ORAL_TABLET | Freq: Every day | ORAL | Status: DC

## 2013-08-03 NOTE — Progress Notes (Signed)
OFFICE NOTE  08/03/2013  CC:  Chief Complaint  Patient presents with  . Fever    off and on for two weeks  . Cough  . Shortness of Breath     HPI: Patient is a 57 y.o. Caucasian female who is here for cough.   Onset 2 wks ago with fever, lots of coughing, gradually feeling SOB, dyspnea and feeling of lungs being "really full".   Reports daily fevers, Tm 102.  Wheezing a lot.  Worse hs--"rattling".  Albuterol inhaler gives relief for a couple of hours. No appetite.  +Malaise.  Some stiffness in neck the last 3-4 days but no pain in neck. Had a HA a few days ago, isolated right temple-gone now.  No ST, was initially scratchy but no pain.   Pertinent PMH:  Past Medical History  Diagnosis Date  . Chronic LBP   . DDD (degenerative disc disease)     spinal stenosis  . Hyperlipidemia     mixed  . History of GI bleed     NSAIDS  . Recurrent UTI   . Microscopic hematuria     full w/ u unrevealing X 2  . Tobacco dependence     w/ only occasional bronchitis illness (CT shest 2009 nl- for f/u of ? nodule on CXR)  . Atypical chest pain 2007    cardiolyte neg, echo nl, cath showed mild/nonobstructive LAD disease  . Normal nuclear stress test 11/11    negative, EF normal preop  . Palpitations 2006    48H holter neg  . Osteoarthritis of knee 06/30/2012    Right    Past surgical, social, and family history reviewed and no changes noted since last office visit.  MEDS:  Outpatient Prescriptions Prior to Visit  Medication Sig Dispense Refill  . acetaminophen (TYLENOL) 160 MG/5ML solution Take 325 mg by mouth every 4 (four) hours as needed.        Marland Kitchen albuterol (VENTOLIN HFA) 108 (90 BASE) MCG/ACT inhaler Inhale 2 puffs into the lungs every 6 (six) hours as needed for wheezing.  1 Inhaler  1  . CRESTOR 10 MG tablet take 1 tablet by mouth once daily  30 tablet  5  . cyclobenzaprine (FLEXERIL) 10 MG tablet take 1 tablet by mouth every 8 hours if needed  30 tablet  5  . esomeprazole  (NEXIUM) 40 MG capsule Take 1 capsule (40 mg total) by mouth daily.  30 capsule  10  . fenofibrate 160 MG tablet take 1 tablet by mouth once daily with food  30 tablet  6  . HYDROcodone-acetaminophen (NORCO) 7.5-325 MG per tablet Take 1 tablet by mouth every 4 (four) hours as needed for pain.  60 tablet  5  . HYDROcodone-acetaminophen (NORCO/VICODIN) 5-325 MG per tablet take 1 tablet by mouth every 6 hours if needed for pain  90 tablet  3  . LORazepam (ATIVAN) 1 MG tablet TAKE 1 TABLET 2 TIMES A DAY AS NEEDED FOR ANXIETY  30 tablet  5   No facility-administered medications prior to visit.    PE: Blood pressure 140/82, pulse 84, temperature 98.7 F (37.1 C), temperature source Temporal, resp. rate 20, height 5\' 5"  (1.651 m), weight 175 lb (79.379 kg), SpO2 94.00%.  Recheck of HR after neb 92, pulse ox after neb 93-94% on RA. Gen: tired appearing but NAD.  Oriented x 4. ENT: Ears: EACs clear, normal epithelium.  TMs with good light reflex and landmarks bilaterally.  Eyes: no injection, icteris, swelling,  or exudate.  EOMI, PERRLA. Nose: no drainage or turbinate edema/swelling.  No injection or focal lesion.  Mouth: lips without lesion/swelling.  Oral mucosa pink and moist.    Oropharynx without erythema, exudate, or swelling.  Neck - No masses or thyromegaly or limitation in range of motion CV: regular, tachy to 120, no m/r/g LUNGS: diffuse exp coarse wheezing, prolonged exp phase, lots of post-exhalation coughing.  No crackles or signs of consolidation prior to alb/atr neb, but after her neb she had right basilar insp crackles. EXT: no clubbing, cyanosis, or edema.    IMPRESSION AND PLAN:  Acute exac COPD with suspected right lower lung pneumonia. Oxygenating fine on RA, not in resp distress, improved aeration after alb/atr neb in office today.   Plan is to continue q4h albut HFA, gave 80mg  depo medrol IM in office today. Levaquin 750mg  PO qd x 10d. Prednisone taper: 60 qd x 5d, then 40 qd  x 5d, then 20 qd x 5d--starting tomorrow. Hycodan susp 1-2 tsp q6h prn, #158ml, no RF.  FOLLOW UP: 3d

## 2013-08-06 ENCOUNTER — Encounter: Payer: Self-pay | Admitting: Family Medicine

## 2013-08-06 ENCOUNTER — Ambulatory Visit (INDEPENDENT_AMBULATORY_CARE_PROVIDER_SITE_OTHER): Payer: Medicare Other | Admitting: Family Medicine

## 2013-08-06 VITALS — BP 137/86 | HR 71 | Temp 98.0°F | Resp 20 | Ht 65.0 in | Wt 176.0 lb

## 2013-08-06 DIAGNOSIS — J441 Chronic obstructive pulmonary disease with (acute) exacerbation: Secondary | ICD-10-CM

## 2013-08-06 MED ORDER — ALBUTEROL SULFATE (2.5 MG/3ML) 0.083% IN NEBU: 2.5000 mg | INHALATION_SOLUTION | Freq: Four times a day (QID) | RESPIRATORY_TRACT | Status: DC | PRN

## 2013-08-06 MED ORDER — ALBUTEROL SULFATE (2.5 MG/3ML) 0.083% IN NEBU
2.5000 mg | INHALATION_SOLUTION | Freq: Once | RESPIRATORY_TRACT | Status: AC
  Administered 2013-08-06: 2.5 mg via RESPIRATORY_TRACT

## 2013-08-06 MED ORDER — IPRATROPIUM BROMIDE 0.02 % IN SOLN
0.5000 mg | Freq: Once | RESPIRATORY_TRACT | Status: AC
  Administered 2013-08-06: 0.5 mg via RESPIRATORY_TRACT

## 2013-08-06 NOTE — Progress Notes (Signed)
OFFICE NOTE  08/06/2013  CC:  Chief Complaint  Patient presents with  . Follow-up     HPI: Patient is a 57 y.o. Caucasian female who is here for 3d f/u acute febrile resp illness. She feels slight improvement in cough, wheezing, SOB, malaise, fevers---still feels horrible in evenings/hs. Last albut 2 puffs about 5 hours ago. Some nausea and hyperglycemia (up to 265) from the steroids. Compliant with levaquin w/out problem.  Pertinent PMH:  Past Medical History  Diagnosis Date  . Chronic LBP   . DDD (degenerative disc disease)     spinal stenosis  . Hyperlipidemia     mixed  . History of GI bleed     NSAIDS  . Recurrent UTI   . Microscopic hematuria     full w/ u unrevealing X 2  . Tobacco dependence     w/ only occasional bronchitis illness (CT shest 2009 nl- for f/u of ? nodule on CXR)  . Atypical chest pain 2007    cardiolyte neg, echo nl, cath showed mild/nonobstructive LAD disease  . Normal nuclear stress test 11/11    negative, EF normal preop  . Palpitations 2006    48H holter neg  . Osteoarthritis of knee 06/30/2012    Right     MEDS:  Outpatient Prescriptions Prior to Visit  Medication Sig Dispense Refill  . acetaminophen (TYLENOL) 160 MG/5ML solution Take 325 mg by mouth every 4 (four) hours as needed.        Marland Kitchen albuterol (VENTOLIN HFA) 108 (90 BASE) MCG/ACT inhaler Inhale 2 puffs into the lungs every 6 (six) hours as needed for wheezing.  1 Inhaler  1  . CRESTOR 10 MG tablet take 1 tablet by mouth once daily  30 tablet  5  . cyclobenzaprine (FLEXERIL) 10 MG tablet take 1 tablet by mouth every 8 hours if needed  30 tablet  5  . esomeprazole (NEXIUM) 40 MG capsule Take 1 capsule (40 mg total) by mouth daily.  30 capsule  10  . fenofibrate 160 MG tablet take 1 tablet by mouth once daily with food  30 tablet  6  . HYDROcodone-acetaminophen (NORCO) 7.5-325 MG per tablet Take 1 tablet by mouth every 4 (four) hours as needed for pain.  60 tablet  5  .  HYDROcodone-acetaminophen (NORCO/VICODIN) 5-325 MG per tablet take 1 tablet by mouth every 6 hours if needed for pain  90 tablet  3  . HYDROcodone-homatropine (HYCODAN) 5-1.5 MG/5ML syrup 1-2 tsp po q6h prn  120 mL  0  . levofloxacin (LEVAQUIN) 750 MG tablet Take 1 tablet (750 mg total) by mouth daily.  10 tablet  0  . LORazepam (ATIVAN) 1 MG tablet TAKE 1 TABLET 2 TIMES A DAY AS NEEDED FOR ANXIETY  30 tablet  5  . predniSONE (DELTASONE) 20 MG tablet 3 tabs po qd x 5d, then 2 tabs po qd x 5d, then 1 tab po qd x 5d  30 tablet  0   No facility-administered medications prior to visit.    PE: Blood pressure 137/86, pulse 71, temperature 98 F (36.7 C), temperature source Temporal, resp. rate 20, height 5\' 5"  (1.651 m), weight 176 lb (79.833 kg), SpO2 94.00%. Gen: alert, tired-appearing, NAD.  Oriented x 4. Coughing a lot. CV: RRR, no m/r/g LUNGS: Right basilar insp crackles, diffuse exp coarse wheezing with mildly prolonged exp phase and lots of post-exhalation coughing. After alb/atr neb today in office she had much improved aeration, much less post-exhalation coughing,  much less wheezing. RR 30s, without distress.  IMPRESSION AND PLAN:  Acute exac of chronic bronchitis, with evidence of RLL infiltrate on exam. She is improving a little bit, stable for continuation of current mgmt. Needs to increase clear fluids, esp in light of her steroid-induced hyperglycemia. Alb/atr neb in office today. Switch to nebulizer with nebulized albut at home until feeling much improved---d/c alb inhaler for now.  FOLLOW UP: 1 wk

## 2013-08-13 ENCOUNTER — Ambulatory Visit (INDEPENDENT_AMBULATORY_CARE_PROVIDER_SITE_OTHER): Payer: Medicare Other | Admitting: Family Medicine

## 2013-08-13 ENCOUNTER — Encounter: Payer: Self-pay | Admitting: Family Medicine

## 2013-08-13 VITALS — BP 133/80 | HR 66 | Temp 98.0°F | Resp 20 | Ht 65.0 in | Wt 176.0 lb

## 2013-08-13 DIAGNOSIS — J441 Chronic obstructive pulmonary disease with (acute) exacerbation: Secondary | ICD-10-CM | POA: Diagnosis not present

## 2013-08-13 DIAGNOSIS — M545 Low back pain: Secondary | ICD-10-CM

## 2013-08-13 DIAGNOSIS — G8929 Other chronic pain: Secondary | ICD-10-CM | POA: Diagnosis not present

## 2013-08-13 DIAGNOSIS — J189 Pneumonia, unspecified organism: Secondary | ICD-10-CM

## 2013-08-13 MED ORDER — HYDROCODONE-ACETAMINOPHEN 5-325 MG PO TABS: ORAL_TABLET | ORAL | Status: DC

## 2013-08-13 NOTE — Progress Notes (Signed)
OFFICE NOTE  08/13/2013  CC:  Chief Complaint  Patient presents with  . Follow-up     HPI: Patient is a 57 y.o. Caucasian female who is here for 7d f/u pneumonia and AECOPD. Levaquin finished yesterday. Prednisone taper down to 20mg  qd tomorrow. No signif fevers, cough much improved, wheezing much improved.  Last albut neb was 14 hrs ago.  Home nebs helping--she is feeling much improved now.  Still with some fatigue but she realizes this takes a while to resolve.   Pertinent PMH:  Past Medical History  Diagnosis Date  . Chronic LBP   . DDD (degenerative disc disease)     spinal stenosis  . Hyperlipidemia     mixed  . History of GI bleed     NSAIDS  . Recurrent UTI   . Microscopic hematuria     full w/ u unrevealing X 2  . Tobacco dependence     w/ only occasional bronchitis illness (CT shest 2009 nl- for f/u of ? nodule on CXR)  . Atypical chest pain 2007    cardiolyte neg, echo nl, cath showed mild/nonobstructive LAD disease  . Normal nuclear stress test 11/11    negative, EF normal preop  . Palpitations 2006    48H holter neg  . Osteoarthritis of knee 06/30/2012    Right     MEDS:  Outpatient Prescriptions Prior to Visit  Medication Sig Dispense Refill  . albuterol (PROVENTIL) (2.5 MG/3ML) 0.083% nebulizer solution Take 3 mLs (2.5 mg total) by nebulization every 6 (six) hours as needed for wheezing.  75 mL  1  . albuterol (VENTOLIN HFA) 108 (90 BASE) MCG/ACT inhaler Inhale 2 puffs into the lungs every 6 (six) hours as needed for wheezing.  1 Inhaler  1  . CRESTOR 10 MG tablet take 1 tablet by mouth once daily  30 tablet  5  . cyclobenzaprine (FLEXERIL) 10 MG tablet take 1 tablet by mouth every 8 hours if needed  30 tablet  5  . esomeprazole (NEXIUM) 40 MG capsule Take 1 capsule (40 mg total) by mouth daily.  30 capsule  10  . fenofibrate 160 MG tablet take 1 tablet by mouth once daily with food  30 tablet  6  . HYDROcodone-acetaminophen (NORCO/VICODIN) 5-325 MG  per tablet take 1 tablet by mouth every 6 hours if needed for pain  90 tablet  3  . HYDROcodone-homatropine (HYCODAN) 5-1.5 MG/5ML syrup 1-2 tsp po q6h prn  120 mL  0  . LORazepam (ATIVAN) 1 MG tablet TAKE 1 TABLET 2 TIMES A DAY AS NEEDED FOR ANXIETY  30 tablet  5  . predniSONE (DELTASONE) 20 MG tablet 3 tabs po qd x 5d, then 2 tabs po qd x 5d, then 1 tab po qd x 5d  30 tablet  0  . acetaminophen (TYLENOL) 160 MG/5ML solution Take 325 mg by mouth every 4 (four) hours as needed.        Marland Kitchen HYDROcodone-acetaminophen (NORCO) 7.5-325 MG per tablet Take 1 tablet by mouth every 4 (four) hours as needed for pain.  60 tablet  5  . levofloxacin (LEVAQUIN) 750 MG tablet Take 1 tablet (750 mg total) by mouth daily.  10 tablet  0   No facility-administered medications prior to visit.    PE: Blood pressure 133/80, pulse 66, temperature 98 F (36.7 C), temperature source Temporal, resp. rate 20, height 5\' 5"  (1.651 m), weight 176 lb (79.833 kg), SpO2 94.00%. Gen: Alert, well appearing.  Patient  is oriented to person, place, time, and situation. CV: RRR, no m/r/g.   LUNGS: CTA bilat, nonlabored resps, good aeration in all lung fields.  Trace insp crackles in right base.   IMPRESSION AND PLAN:  Pneumonia with acute exacerbation of COPD: much improved. Finished w/ abx.  Finish steroid taper over the next week. May change back to albut HFA instead of nebs at this time.  For her chronic LB pain.  I rx her norco 5/325, she takes approx 90 per month. She reviewed and signed a controlled substance contract today in the office. Since her last Norco rx had RFs but these won't be able to be filled (it is now schedule 2), I did write #90, no RF--today--, plus two additional/identicle rx's with appropriate fill on/after dating to have her pharmacy keep.  FOLLOW UP: 3 mo for CPE (fasting)

## 2013-08-20 ENCOUNTER — Telehealth: Payer: Self-pay | Admitting: Family Medicine

## 2013-08-20 MED ORDER — FLUCONAZOLE 150 MG PO TABS: ORAL_TABLET | ORAL | Status: DC

## 2013-08-20 NOTE — Telephone Encounter (Signed)
Please advise 

## 2013-08-20 NOTE — Telephone Encounter (Signed)
Patient has thrush in her mouth. She states her tongue has white spots on it & she thinks it is moving into her throat. She has had thrush before. Please advise.

## 2013-08-20 NOTE — Telephone Encounter (Signed)
Fluconazole tabs rx sent to her pharmacy today.

## 2013-08-21 ENCOUNTER — Encounter (HOSPITAL_COMMUNITY): Payer: Self-pay | Admitting: Emergency Medicine

## 2013-08-21 ENCOUNTER — Emergency Department (HOSPITAL_COMMUNITY)
Admission: EM | Admit: 2013-08-21 | Discharge: 2013-08-22 | Disposition: A | Discharge status: Home / Self Care | Payer: Medicare Other | Attending: Emergency Medicine | Admitting: Emergency Medicine

## 2013-08-21 ENCOUNTER — Emergency Department (HOSPITAL_COMMUNITY): Payer: Medicare Other

## 2013-08-21 DIAGNOSIS — F17200 Nicotine dependence, unspecified, uncomplicated: Secondary | ICD-10-CM | POA: Diagnosis not present

## 2013-08-21 DIAGNOSIS — G8929 Other chronic pain: Secondary | ICD-10-CM | POA: Insufficient documentation

## 2013-08-21 DIAGNOSIS — R509 Fever, unspecified: Secondary | ICD-10-CM | POA: Diagnosis not present

## 2013-08-21 DIAGNOSIS — R110 Nausea: Secondary | ICD-10-CM | POA: Insufficient documentation

## 2013-08-21 DIAGNOSIS — R1310 Dysphagia, unspecified: Secondary | ICD-10-CM | POA: Insufficient documentation

## 2013-08-21 DIAGNOSIS — E782 Mixed hyperlipidemia: Secondary | ICD-10-CM | POA: Insufficient documentation

## 2013-08-21 DIAGNOSIS — R1013 Epigastric pain: Secondary | ICD-10-CM | POA: Insufficient documentation

## 2013-08-21 DIAGNOSIS — IMO0002 Reserved for concepts with insufficient information to code with codable children: Secondary | ICD-10-CM | POA: Insufficient documentation

## 2013-08-21 DIAGNOSIS — Z8719 Personal history of other diseases of the digestive system: Secondary | ICD-10-CM | POA: Insufficient documentation

## 2013-08-21 DIAGNOSIS — Z880 Allergy status to penicillin: Secondary | ICD-10-CM | POA: Insufficient documentation

## 2013-08-21 DIAGNOSIS — J441 Chronic obstructive pulmonary disease with (acute) exacerbation: Secondary | ICD-10-CM | POA: Insufficient documentation

## 2013-08-21 DIAGNOSIS — Z8701 Personal history of pneumonia (recurrent): Secondary | ICD-10-CM | POA: Insufficient documentation

## 2013-08-21 DIAGNOSIS — Z87440 Personal history of urinary (tract) infections: Secondary | ICD-10-CM | POA: Insufficient documentation

## 2013-08-21 DIAGNOSIS — Z79899 Other long term (current) drug therapy: Secondary | ICD-10-CM | POA: Insufficient documentation

## 2013-08-21 DIAGNOSIS — B370 Candidal stomatitis: Secondary | ICD-10-CM | POA: Insufficient documentation

## 2013-08-21 DIAGNOSIS — Z792 Long term (current) use of antibiotics: Secondary | ICD-10-CM | POA: Diagnosis not present

## 2013-08-21 DIAGNOSIS — J029 Acute pharyngitis, unspecified: Secondary | ICD-10-CM | POA: Diagnosis not present

## 2013-08-21 DIAGNOSIS — R05 Cough: Secondary | ICD-10-CM | POA: Diagnosis not present

## 2013-08-21 DIAGNOSIS — R5381 Other malaise: Secondary | ICD-10-CM | POA: Insufficient documentation

## 2013-08-21 DIAGNOSIS — R0602 Shortness of breath: Secondary | ICD-10-CM | POA: Diagnosis not present

## 2013-08-21 DIAGNOSIS — M1710 Unilateral primary osteoarthritis, unspecified knee: Secondary | ICD-10-CM | POA: Insufficient documentation

## 2013-08-21 HISTORY — DX: Chronic obstructive pulmonary disease, unspecified: J44.9

## 2013-08-21 LAB — CBC WITH DIFFERENTIAL/PLATELET
Basophils Relative: 0 % (ref 0–1)
Eosinophils Absolute: 0.2 10*3/uL (ref 0.0–0.7)
Eosinophils Relative: 2 % (ref 0–5)
Hemoglobin: 14 g/dL (ref 12.0–15.0)
Lymphs Abs: 3 10*3/uL (ref 0.7–4.0)
MCH: 31.5 pg (ref 26.0–34.0)
MCHC: 34 g/dL (ref 30.0–36.0)
MCV: 92.8 fL (ref 78.0–100.0)
Monocytes Absolute: 1.4 10*3/uL — ABNORMAL HIGH (ref 0.1–1.0)
Monocytes Relative: 10 % (ref 3–12)
Neutrophils Relative %: 67 % (ref 43–77)

## 2013-08-21 LAB — COMPREHENSIVE METABOLIC PANEL
Albumin: 3.7 g/dL (ref 3.5–5.2)
BUN: 13 mg/dL (ref 6–23)
CO2: 26 mEq/L (ref 19–32)
Calcium: 9.5 mg/dL (ref 8.4–10.5)
Creatinine, Ser: 0.65 mg/dL (ref 0.50–1.10)
GFR calc non Af Amer: >90 mL/min (ref >90)
Glucose, Bld: 109 mg/dL — ABNORMAL HIGH (ref 70–99)
Potassium: 3.9 mEq/L (ref 3.5–5.1)
Total Protein: 7.3 g/dL (ref 6.0–8.3)

## 2013-08-21 LAB — URINALYSIS, ROUTINE W REFLEX MICROSCOPIC
Leukocytes, UA: NEGATIVE
Nitrite: NEGATIVE
Specific Gravity, Urine: <1.005 — ABNORMAL LOW (ref 1.005–1.030)
Urobilinogen, UA: 0.2 mg/dL (ref 0.0–1.0)
pH: 6 (ref 5.0–8.0)

## 2013-08-21 LAB — TROPONIN I: Troponin I: <0.3 ng/mL (ref <0.30)

## 2013-08-21 MED ORDER — ONDANSETRON HCL 4 MG/2ML IJ SOLN
4.0000 mg | Freq: Once | INTRAMUSCULAR | Status: AC
  Administered 2013-08-21: 4 mg via INTRAVENOUS

## 2013-08-21 MED ORDER — SODIUM CHLORIDE 0.9 % IV BOLUS (SEPSIS)
1000.0000 mL | Freq: Once | INTRAVENOUS | Status: AC
  Administered 2013-08-21: 1000 mL via INTRAVENOUS

## 2013-08-21 MED ORDER — MORPHINE SULFATE 4 MG/ML IJ SOLN
4.0000 mg | Freq: Once | INTRAMUSCULAR | Status: AC
  Administered 2013-08-21: 4 mg via INTRAVENOUS
  Filled 2013-08-21: qty 1

## 2013-08-21 MED ORDER — ONDANSETRON HCL 4 MG/2ML IJ SOLN
4.0000 mg | Freq: Once | INTRAMUSCULAR | Status: DC
  Filled 2013-08-21: qty 2

## 2013-08-21 NOTE — ED Notes (Addendum)
Recent dx of pneumonia, developed thrush, Last night had fever 102, and increased sob.  Has finished her antibiotic Pain epigastric and lower sternum.  Cough, clear sputum.  Nausea,

## 2013-08-22 MED ORDER — GI COCKTAIL ~~LOC~~
30.0000 mL | Freq: Once | ORAL | Status: AC
  Administered 2013-08-22: 30 mL via ORAL
  Filled 2013-08-22: qty 30

## 2013-08-22 MED ORDER — ALBUTEROL SULFATE (5 MG/ML) 0.5% IN NEBU
5.0000 mg | INHALATION_SOLUTION | Freq: Once | RESPIRATORY_TRACT | Status: AC
  Administered 2013-08-22: 5 mg via RESPIRATORY_TRACT
  Filled 2013-08-22: qty 1

## 2013-08-22 MED ORDER — GI COCKTAIL ~~LOC~~: 15.0000 mL | Freq: Three times a day (TID) | ORAL | Status: DC | PRN

## 2013-08-22 MED ORDER — IPRATROPIUM BROMIDE 0.02 % IN SOLN
0.5000 mg | Freq: Once | RESPIRATORY_TRACT | Status: AC
  Administered 2013-08-22: 0.5 mg via RESPIRATORY_TRACT
  Filled 2013-08-22: qty 2.5

## 2013-08-22 NOTE — ED Provider Notes (Addendum)
Medical screening examination/treatment/procedure(s) were performed by non-physician practitioner and as supervising physician I was immediately available for consultation/collaboration.  EKG Interpretation     Ventricular Rate:  92 PR Interval:  132 QRS Duration: 84 QT Interval:  334 QTC Calculation: 413 R Axis:   5 Text Interpretation:  Normal sinus rhythm Possible Inferior infarct , age undetermined Abnormal ECG When compared with ECG of 02-Jun-2009 18:57, Borderline criteria for Inferior infarct are now Present Artifact Normal sinus rhythm Abnormal ekg              Hanley Seamen, MD 08/22/13 1610  Hanley Seamen, MD 08/27/13 2252

## 2013-08-22 NOTE — ED Notes (Signed)
Patient given discharge instruction, verbalized understand. IV removed, band aid applied. Patient ambulatory out of the department.  

## 2013-08-22 NOTE — ED Provider Notes (Signed)
CSN: [id number]     Arrival date & time 08/21/13  2218 History   First MD Initiated Contact with Patient 08/21/13 2234     Chief Complaint  Patient presents with  . Shortness of Breath   (Consider location/radiation/quality/duration/timing/severity/associated sxs/prior Treatment) HPI Comments: Brenda [name] is a 57 y.o. Female with a history of copd and a recent history of pneumonia treated with a 10 day course of levaquin along with a prednisone taper which she completed last week.  She has in the interim developed oral and (suspected) esophageal thrush and was started on diflucan yesterday.  She has re developed fever, shortness of breath, cough productive of clear frothy sputum along with wheezing yesterday,  Husband reporting fever to 102 last night.  She reports persistent mouth and throat pain,  Stating it hurts all the way to her lower esophagus when she swallows food but has been able to maintain fluid intake.  She does have albuterol both neb and mdi, last use of this was earlier this morning.  She feels generally fatigued with mild nausea without emesis.     The history is provided by the patient.    Past Medical History  Diagnosis Date  . Chronic LBP   . DDD (degenerative disc disease)     spinal stenosis  . Hyperlipidemia     mixed  . History of GI bleed     NSAIDS  . Recurrent UTI   . Microscopic hematuria     full w/ u unrevealing X 2  . Tobacco dependence     w/ only occasional bronchitis illness (CT shest 2009 nl- for f/u of ? nodule on CXR)  . Atypical chest pain 2007    cardiolyte neg, echo nl, cath showed mild/nonobstructive LAD disease  . Normal nuclear stress test 11/11    negative, EF normal preop  . Palpitations 2006    48H holter neg  . Osteoarthritis of knee 06/30/2012    Right   . COPD (chronic obstructive pulmonary disease)    Past Surgical History  Procedure Laterality Date  . Lumbar spine surgery  1993    right iliac crest bone graft+metal  instrumentation; 2005 metal removal and decompression, 2011 lumbar decompression 4-11, then stabilization/ instrumentation done 09-19-10: L2,L3, L5 left and L2 , L3, L4 right pedical remnant L4 left embedded. Left iliac crest bone graft-- Dr Clotilde Dieter  . Appendectomy  1984  . Abdominal hysterectomy  1997    DUB  . Right oopherectomy      cyst  . Tonsillectomy  57 yrs old  . Knee arthroscopy      right and left  . Left wrist ganglion cyst excision  2010  . Tonsillectomy     Family History  Problem Relation Age of Onset  . Coronary artery disease Neg Hx   . Other Mother     heart and thyroid problems  . Hyperlipidemia Mother   . Diabetes Mother   . Cancer Mother     breast  . Other Father     CHF  . Other Brother     heart, back problems  . Hypertension Brother   . Cancer Paternal Grandmother     mouth- snuff  . Heart attack Paternal Grandfather    History  Substance Use Topics  . Smoking status: Current Some Day Smoker -- 0.25 packs/day for 35 years  . Smokeless tobacco: Never Used  . Alcohol Use: No   OB History   Grav Para Term Preterm  Abortions TAB SAB Ect Mult Living                 Review of Systems  Constitutional: Positive for fever and fatigue.  HENT: Positive for sore throat and trouble swallowing. Negative for congestion, rhinorrhea and voice change.   Eyes: Negative.   Respiratory: Positive for cough, shortness of breath and wheezing. Negative for chest tightness.   Cardiovascular: Negative for chest pain.  Gastrointestinal: Positive for nausea and abdominal pain. Negative for vomiting.  Genitourinary: Negative.   Musculoskeletal: Negative for arthralgias, joint swelling and neck pain.  Skin: Negative.  Negative for rash and wound.  Neurological: Negative for dizziness, weakness, light-headedness, numbness and headaches.  Psychiatric/Behavioral: Negative.     Allergies  Amitriptyline hcl; Aspirin; Beta adrenergic blockers; Nsaids; Penicillins;  Prochlorperazine edisylate; and Sulfonamide derivatives  Home Medications   Current Outpatient Rx  Name  Route  Sig  Dispense  Refill  . acetaminophen (TYLENOL) 160 MG/5ML solution   Oral   Take 325 mg by mouth every 4 (four) hours as needed.           Marland Kitchen albuterol (PROVENTIL) (2.5 MG/3ML) 0.083% nebulizer solution   Nebulization   Take 3 mLs (2.5 mg total) by nebulization every 6 (six) hours as needed for wheezing.   75 mL   1   . albuterol (VENTOLIN HFA) 108 (90 BASE) MCG/ACT inhaler   Inhalation   Inhale 2 puffs into the lungs every 6 (six) hours as needed for wheezing.   1 Inhaler   1   . Alum & Mag Hydroxide-Simeth (GI COCKTAIL) SUSP suspension   Oral   Take 15 mLs by mouth 3 (three) times daily as needed (mouth and esophagus pain). Shake well.   250 mL   0   . CRESTOR 10 MG tablet      take 1 tablet by mouth once daily   30 tablet   5   . cyclobenzaprine (FLEXERIL) 10 MG tablet      take 1 tablet by mouth every 8 hours if needed   30 tablet   5   . esomeprazole (NEXIUM) 40 MG capsule   Oral   Take 1 capsule (40 mg total) by mouth daily.   30 capsule   10   . fenofibrate 160 MG tablet      take 1 tablet by mouth once daily with food   30 tablet   6   . fluconazole (DIFLUCAN) 150 MG tablet      1 tab po qd x 3d   3 tablet   1   . HYDROcodone-acetaminophen (NORCO/VICODIN) 5-325 MG per tablet      take 1 tablet by mouth every 6 hours if needed for pain   90 tablet   0     Fill on or after 10/11/13   . HYDROcodone-homatropine (HYCODAN) 5-1.5 MG/5ML syrup      1-2 tsp po q6h prn   120 mL   0   . levofloxacin (LEVAQUIN) 750 MG tablet   Oral   Take 1 tablet (750 mg total) by mouth daily.   10 tablet   0   . LORazepam (ATIVAN) 1 MG tablet      TAKE 1 TABLET 2 TIMES A DAY AS NEEDED FOR ANXIETY   30 tablet   5   . predniSONE (DELTASONE) 20 MG tablet      3 tabs po qd x 5d, then 2 tabs po qd x 5d, then 1 tab  po qd x 5d   30 tablet    0    BP 122/61  Pulse 80  Temp(Src) 100 F (37.8 C) (Oral)  Resp 15  Ht 5\' 5"  (1.651 m)  Wt 173 lb (78.472 kg)  BMI 28.79 kg/m2  SpO2 98% Physical Exam  Nursing note and vitals reviewed. Constitutional: She appears well-developed and well-nourished.  HENT:  Head: Normocephalic and atraumatic.  Nose: Nose normal.  Mouth/Throat: No trismus in the jaw. No uvula swelling. Oropharyngeal exudate and posterior oropharyngeal erythema present.  Scattered white patches on tongue and buccal mucosa.  Eyes: Conjunctivae are normal.  Neck: Normal range of motion. No edema present.  Cardiovascular: Normal rate, regular rhythm, normal heart sounds and intact distal pulses.   Pulmonary/Chest: Effort normal. No stridor. She has decreased breath sounds. She has wheezes. She has no rhonchi. She has no rales.  Expiratory wheeze bilateral bases, prolonged respirations.  Abdominal: Soft. Bowel sounds are normal. There is tenderness in the epigastric area. There is no guarding and negative Murphy's sign.  Musculoskeletal: Normal range of motion.  Neurological: She is alert.  Skin: Skin is warm and dry.  Psychiatric: She has a normal mood and affect.    ED Course  Procedures (including critical care time) Labs Review Labs Reviewed  CBC WITH DIFFERENTIAL - Abnormal; Notable for the following:    WBC 14.1 (*)    Neutro Abs 9.5 (*)    Monocytes Absolute 1.4 (*)    All other components within normal limits  COMPREHENSIVE METABOLIC PANEL - Abnormal; Notable for the following:    Glucose, Bld 109 (*)    All other components within normal limits  URINALYSIS, ROUTINE W REFLEX MICROSCOPIC - Abnormal; Notable for the following:    Specific Gravity, Urine <1.005 (*)    Hgb urine dipstick LARGE (*)    All other components within normal limits  TROPONIN I  URINE MICROSCOPIC-ADD ON   Imaging Review Dg Chest Port 1 View  08/21/2013   CLINICAL DATA:  Shortness of breath, cough, congestion, fever  EXAM:  PORTABLE CHEST - 1 VIEW  COMPARISON:  Prior CT from 09/15/2008  FINDINGS: A cardiac and mediastinal silhouettes are within normal limits.  Lungs are normally inflated. No airspace consolidation to suggest Brenda acute infectious pneumonitis. There is no pulmonary edema or pleural effusion. No pneumothorax.  No acute osseous abnormality.  IMPRESSION: No active disease.   Electronically Signed   By: Rise Mu M.D.   On: 08/21/2013 23:31    EKG Interpretation     Ventricular Rate:  92 PR Interval:  132 QRS Duration: 84 QT Interval:  334 QTC Calculation: 413 R Axis:   5 Text Interpretation:  Normal sinus rhythm Possible Inferior infarct , age undetermined Abnormal ECG When compared with ECG of 02-Jun-2009 18:57, Borderline criteria for Inferior infarct are now Present Artifact Normal sinus rhythm Abnormal ekg            MDM   1. Thrush of mouth and esophagus   2. COPD exacerbation    Pt was given GI cocktail with improvement in mouth and throat pain.  She still has some mild epigastric discomfort.  Her wheezing is improved after receiving albuterol and atrovent neb tx.    Patients labs and/or radiological studies were viewed and considered during the medical decision making and disposition process. No pneumonia per xray tonight.  Labs stable,  Leukocytosis probably from current thrush infection.  She was prescribed GI cocktail for mouth and esophagus  pain.  Encouraged soft diet, increased fluids, rest.  Recheck by pcp this week,  Return here sooner for any worsened sx.  Also encouraged albuterol neb q 4 hours if wheezing, cough or sob.  Discussed with Dr Read Drivers prior to dc home.      Burgess Amor, PA-C 08/22/13 [phone]

## 2013-08-27 ENCOUNTER — Encounter: Payer: Medicare Other | Admitting: Family Medicine

## 2013-09-04 ENCOUNTER — Other Ambulatory Visit: Payer: Self-pay | Admitting: Family Medicine

## 2013-09-04 NOTE — Telephone Encounter (Signed)
Patient requesting ativan refill.  Patient last seen 10.9.14.   Rx last printed 4.24.14 x 5 refills.  Please advise.

## 2013-09-10 ENCOUNTER — Other Ambulatory Visit: Payer: Self-pay

## 2013-11-12 ENCOUNTER — Encounter: Payer: Self-pay | Admitting: Family Medicine

## 2013-11-12 ENCOUNTER — Ambulatory Visit (INDEPENDENT_AMBULATORY_CARE_PROVIDER_SITE_OTHER): Payer: Medicare Other | Admitting: Family Medicine

## 2013-11-12 VITALS — BP 128/85 | HR 80 | Temp 98.0°F | Resp 18 | Ht 65.0 in | Wt 181.0 lb

## 2013-11-12 DIAGNOSIS — M545 Low back pain, unspecified: Secondary | ICD-10-CM | POA: Diagnosis not present

## 2013-11-12 DIAGNOSIS — E119 Type 2 diabetes mellitus without complications: Secondary | ICD-10-CM | POA: Diagnosis not present

## 2013-11-12 DIAGNOSIS — Z0000 Encounter for general adult medical examination without abnormal findings: Secondary | ICD-10-CM | POA: Diagnosis not present

## 2013-11-12 DIAGNOSIS — Z23 Encounter for immunization: Secondary | ICD-10-CM

## 2013-11-12 LAB — COMPREHENSIVE METABOLIC PANEL
ALT: 16 U/L (ref 0–35)
AST: 19 U/L (ref 0–37)
Albumin: 4.5 g/dL (ref 3.5–5.2)
Alkaline Phosphatase: 40 U/L (ref 39–117)
BUN: 13 mg/dL (ref 6–23)
CALCIUM: 9.8 mg/dL (ref 8.4–10.5)
CHLORIDE: 108 meq/L (ref 96–112)
CO2: 27 mEq/L (ref 19–32)
CREATININE: 0.7 mg/dL (ref 0.4–1.2)
GFR: 99.7 mL/min (ref >60.00)
Glucose, Bld: 100 mg/dL — ABNORMAL HIGH (ref 70–99)
POTASSIUM: 4.3 meq/L (ref 3.5–5.1)
Sodium: 144 mEq/L (ref 135–145)
Total Bilirubin: 0.4 mg/dL (ref 0.3–1.2)
Total Protein: 7 g/dL (ref 6.0–8.3)

## 2013-11-12 LAB — LIPID PANEL
CHOLESTEROL: 173 mg/dL (ref 0–200)
HDL: 47.2 mg/dL (ref >39.00)
LDL CALC: 111 mg/dL — AB (ref 0–99)
TRIGLYCERIDES: 72 mg/dL (ref 0.0–149.0)
Total CHOL/HDL Ratio: 4
VLDL: 14.4 mg/dL (ref 0.0–40.0)

## 2013-11-12 LAB — CBC WITH DIFFERENTIAL/PLATELET
Basophils Absolute: 0 10*3/uL (ref 0.0–0.1)
Basophils Relative: 0.5 % (ref 0.0–3.0)
EOS ABS: 0.1 10*3/uL (ref 0.0–0.7)
Eosinophils Relative: 1.6 % (ref 0.0–5.0)
HEMATOCRIT: 40.9 % (ref 36.0–46.0)
HEMOGLOBIN: 13.9 g/dL (ref 12.0–15.0)
LYMPHS ABS: 3.6 10*3/uL (ref 0.7–4.0)
Lymphocytes Relative: 40.8 % (ref 12.0–46.0)
MCHC: 34.1 g/dL (ref 30.0–36.0)
MCV: 90 fl (ref 78.0–100.0)
MONOS PCT: 5.9 % (ref 3.0–12.0)
Monocytes Absolute: 0.5 10*3/uL (ref 0.1–1.0)
NEUTROS ABS: 4.5 10*3/uL (ref 1.4–7.7)
Neutrophils Relative %: 51.2 % (ref 43.0–77.0)
Platelets: 209 10*3/uL (ref 150.0–400.0)
RBC: 4.55 Mil/uL (ref 3.87–5.11)
RDW: 13.9 % (ref 11.5–14.6)
WBC: 8.8 10*3/uL (ref 4.5–10.5)

## 2013-11-12 LAB — TSH: TSH: 0.91 u[IU]/mL (ref 0.35–5.50)

## 2013-11-12 LAB — MICROALBUMIN / CREATININE URINE RATIO
CREATININE, U: 106.4 mg/dL
MICROALB UR: 0.4 mg/dL (ref 0.0–1.9)
Microalb Creat Ratio: 0.4 mg/g (ref 0.0–30.0)

## 2013-11-12 LAB — HEMOGLOBIN A1C: HEMOGLOBIN A1C: 5.8 % (ref 4.6–6.5)

## 2013-11-12 MED ORDER — HYDROCODONE-ACETAMINOPHEN 5-325 MG PO TABS: ORAL_TABLET | ORAL | Status: DC

## 2013-11-12 NOTE — Assessment & Plan Note (Signed)
Gave pt #90 vicodin for monthly supply, one rx for this month, one for Feb 2015 and one for Mar 2015 with approp fill on/after dating. Drug contract is already signed and in chart.

## 2013-11-12 NOTE — Assessment & Plan Note (Signed)
DPN + PN from history of DDD/spinal stenosis. DM well controlled with diet only. Check HbA1c today as well as urine microalb/cr.

## 2013-11-12 NOTE — Assessment & Plan Note (Signed)
Reviewed age and gender appropriate health maintenance issues (prudent diet, regular exercise, health risks of tobacco and excessive alcohol, use of seatbelts, fire alarms in home, use of sunscreen).  Also reviewed age and gender appropriate health screening as well as vaccine recommendations. Fasting HP today. Encouraged pt to completely quit smoking. Gave prevnar 13 today as per latest CDC immunization recommendations. Ifob given today.  She declines screening colonoscopy but understands if iFob comes back positive then she'll need to proceed with this. She declined clinical breast exam or mammogram at this time. She is not a candidate for cervical cancer screening due to hx of complete hysterectomy for DUB and NO HX of abnormal paps in the past.

## 2013-11-12 NOTE — Progress Notes (Signed)
Pre visit review using our clinic review tool, if applicable. No additional management support is needed unless otherwise documented below in the visit note. 

## 2013-11-12 NOTE — Progress Notes (Signed)
Office Note 11/12/2013  CC:  Chief Complaint  Patient presents with  . Annual Exam    HPI:  Brenda [name] is a 58 y.o. White female who is here today for CPE. Checks glucose qAM and avg is around 110.   She recalls some sugars in the 200s when on steroids a couple of months ago for COPD exac. Still smoking a few cigs a day still.  Past Medical History  Diagnosis Date  . Chronic LBP   . DDD (degenerative disc disease)     spinal stenosis  . Hyperlipidemia     mixed  . History of GI bleed     NSAIDS  . Recurrent UTI   . Microscopic hematuria     full w/ u unrevealing X 2  . Tobacco dependence     w/ only occasional bronchitis illness (CT shest 2009 nl- for f/u of ? nodule on CXR)  . Atypical chest pain 2007    cardiolyte neg, echo nl, cath showed mild/nonobstructive LAD disease  . Normal nuclear stress test 11/11    negative, EF normal preop  . Palpitations 2006    48H holter neg  . Osteoarthritis of knee 06/30/2012    Right   . COPD (chronic obstructive pulmonary disease)     Past Surgical History  Procedure Laterality Date  . Lumbar spine surgery  1993    right iliac crest bone graft+metal instrumentation; 2005 metal removal and decompression, 2011 lumbar decompression 4-11, then stabilization/ instrumentation done 09-19-10: L2,L3, L5 left and L2 , L3, L4 right pedical remnant L4 left embedded. Left iliac crest bone graft-- Dr Velna Ochs  . Appendectomy  1984  . Abdominal hysterectomy  1997    DUB  . Right oopherectomy      cyst  . Tonsillectomy  58 yrs old  . Knee arthroscopy      right and left  . Left wrist ganglion cyst excision  2010  . Tonsillectomy      Family History  Problem Relation Age of Onset  . Coronary artery disease Neg Hx   . Other Mother     heart and thyroid problems  . Hyperlipidemia Mother   . Diabetes Mother   . Cancer Mother     breast  . Other Father     CHF  . Other Brother     heart, back problems  . Hypertension Brother    . Cancer Paternal Grandmother     mouth- snuff  . Heart attack Paternal Grandfather     History   Social History  . Marital Status: Married    Spouse Name: N/A    Number of Children: N/A  . Years of Education: N/A   Occupational History  . Not on file.   Social History Main Topics  . Smoking status: Current Some Day Smoker -- 0.25 packs/day for 35 years  . Smokeless tobacco: Never Used  . Alcohol Use: No  . Drug Use: No  . Sexual Activity: Yes    Partners: Male   Other Topics Concern  . Not on file   Social History Narrative  . No narrative on file    Outpatient Prescriptions Prior to Visit  Medication Sig Dispense Refill  . albuterol (VENTOLIN HFA) 108 (90 BASE) MCG/ACT inhaler Inhale 2 puffs into the lungs every 6 (six) hours as needed for wheezing.  1 Inhaler  1  . CRESTOR 10 MG tablet take 1 tablet by mouth once daily  30 tablet  5  .  cyclobenzaprine (FLEXERIL) 10 MG tablet take 1 tablet by mouth every 8 hours if needed  30 tablet  5  . esomeprazole (NEXIUM) 40 MG capsule Take 1 capsule (40 mg total) by mouth daily.  30 capsule  10  . fenofibrate 160 MG tablet take 1 tablet by mouth once daily with food  30 tablet  6  . LORazepam (ATIVAN) 1 MG tablet TAKE 1 TABLET 2 TIMES A DAY AS NEEDED FOR ANXIETY  30 tablet  5  . HYDROcodone-acetaminophen (NORCO/VICODIN) 5-325 MG per tablet take 1 tablet by mouth every 6 hours if needed for pain  90 tablet  0  . acetaminophen (TYLENOL) 160 MG/5ML solution Take 325 mg by mouth every 4 (four) hours as needed.        Marland Kitchen albuterol (PROVENTIL) (2.5 MG/3ML) 0.083% nebulizer solution Take 3 mLs (2.5 mg total) by nebulization every 6 (six) hours as needed for wheezing.  75 mL  1  . Alum & Mag Hydroxide-Simeth (GI COCKTAIL) SUSP suspension Take 15 mLs by mouth 3 (three) times daily as needed (mouth and esophagus pain). Shake well.  250 mL  0  . fluconazole (DIFLUCAN) 150 MG tablet 1 tab po qd x 3d  3 tablet  1  . HYDROcodone-homatropine  (HYCODAN) 5-1.5 MG/5ML syrup 1-2 tsp po q6h prn  120 mL  0  . levofloxacin (LEVAQUIN) 750 MG tablet Take 1 tablet (750 mg total) by mouth daily.  10 tablet  0  . predniSONE (DELTASONE) 20 MG tablet 3 tabs po qd x 5d, then 2 tabs po qd x 5d, then 1 tab po qd x 5d  30 tablet  0   No facility-administered medications prior to visit.    Allergies  Allergen Reactions  . Amitriptyline Hcl     REACTION: increased heart rate  . Aspirin     REACTION: GI Bleed  . Beta Adrenergic Blockers     REACTION: decreased libido  . Nsaids     REACTION: GI Upset  . Penicillins     REACTION: swelling  . Prochlorperazine Edisylate     REACTION: Stroke like symptoms  . Sulfonamide Derivatives     REACTION: unsure rxn    ROS Review of Systems  Constitutional: Negative for fever, chills, appetite change and fatigue.  HENT: Negative for congestion, dental problem, ear pain and sore throat.   Eyes: Negative for discharge, redness and visual disturbance.  Respiratory: Negative for cough, chest tightness, shortness of breath and wheezing.   Cardiovascular: Negative for chest pain, palpitations and leg swelling.  Gastrointestinal: Negative for nausea, vomiting, abdominal pain, diarrhea and blood in stool.  Genitourinary: Negative for dysuria, urgency, frequency, hematuria, flank pain and difficulty urinating.  Musculoskeletal: Negative for arthralgias, back pain, joint swelling, myalgias and neck stiffness.  Skin: Negative for pallor and rash.  Neurological: Negative for dizziness, speech difficulty, weakness and headaches. Numbness: burning and tingling in bottoms of both feet intermittently--chronic.  Hematological: Negative for adenopathy. Does not bruise/bleed easily.  Psychiatric/Behavioral: Negative for confusion and sleep disturbance. The patient is not nervous/anxious.      PE; Blood pressure 128/85, pulse 80, temperature 98 F (36.7 C), temperature source Temporal, resp. rate 18, height 5\' 5"   (1.651 m), weight 181 lb (82.101 kg), SpO2 95.00%. Gen: Alert, well appearing.  Patient is oriented to person, place, time, and situation. AFFECT: pleasant, lucid thought and speech. ENT: Ears: EACs clear, normal epithelium.  TMs with good light reflex and landmarks bilaterally.  Eyes: no injection, icteris,  swelling, or exudate.  EOMI, PERRLA. Nose: no drainage or turbinate edema/swelling.  No injection or focal lesion.  Mouth: lips without lesion/swelling.  Oral mucosa pink and moist.  Dentition in mild disrepair, without obvious caries or gingival swelling.  Oropharynx without erythema, exudate, or swelling.  Neck: supple/nontender.  No LAD, mass, or TM.  Carotid pulses 2+ bilaterally, without bruits. CV: RRR, no m/r/g.   LUNGS: CTA bilat, nonlabored resps, good aeration in all lung fields. ABD: soft, NT, ND, BS normal.  No hepatospenomegaly or mass.  No bruits. EXT: no clubbing, cyanosis, or edema.  Musculoskeletal: no joint swelling, erythema, warmth, or tenderness.  ROM of all joints intact. Skin - no sores or suspicious lesions or rashes or color changes Foot exam -  no swelling, tenderness or skin or vascular lesions. Color and temperature is normal.  Some skin cracking on soles but no calluses or cracking elsewhere.  She has diminished sensation to monofilament testing from mid tibial level down on both sides.  Peripheral pulses are palpable. Toenails are normal.  Pertinent labs:  None today  ASSESSMENT AND PLAN:   Type II or unspecified type diabetes mellitus with neurological manifestations, not stated as uncontrolled(250.60) DPN + PN from history of DDD/spinal stenosis. DM well controlled with diet only. Check HbA1c today as well as urine microalb/cr.  Health maintenance examination Reviewed age and gender appropriate health maintenance issues (prudent diet, regular exercise, health risks of tobacco and excessive alcohol, use of seatbelts, fire alarms in home, use of sunscreen).   Also reviewed age and gender appropriate health screening as well as vaccine recommendations. Fasting HP today. Encouraged pt to completely quit smoking. Gave prevnar 13 today as per latest CDC immunization recommendations. Ifob given today.  She declines screening colonoscopy but understands if iFob comes back positive then she'll need to proceed with this. She declined clinical breast exam or mammogram at this time. She is not a candidate for cervical cancer screening due to hx of complete hysterectomy for DUB and NO HX of abnormal paps in the past.  BACK PAIN, LUMBAR, CHRONIC Gave pt #90 vicodin for monthly supply, one rx for this month, one for Feb 2015 and one for Mar 2015 with approp fill on/after dating. Drug contract is already signed and in chart.  An After Visit Summary was printed and given to the patient.  FOLLOW UP:  Return in about 4 months (around 03/12/2014) for f/u DM, COPD, chr LBP.

## 2013-11-13 ENCOUNTER — Telehealth: Payer: Self-pay | Admitting: Family Medicine

## 2013-11-13 NOTE — Telephone Encounter (Signed)
Relevant patient education assigned to patient using Emmi. ° °

## 2013-11-23 ENCOUNTER — Other Ambulatory Visit: Payer: Medicare Other

## 2013-11-24 ENCOUNTER — Other Ambulatory Visit: Payer: Self-pay | Admitting: Family Medicine

## 2013-11-24 ENCOUNTER — Other Ambulatory Visit (INDEPENDENT_AMBULATORY_CARE_PROVIDER_SITE_OTHER): Payer: Medicare Other

## 2013-11-24 DIAGNOSIS — Z0000 Encounter for general adult medical examination without abnormal findings: Secondary | ICD-10-CM | POA: Diagnosis not present

## 2013-11-24 LAB — FECAL OCCULT BLOOD, IMMUNOCHEMICAL: Fecal Occult Bld: NEGATIVE

## 2013-12-25 ENCOUNTER — Telehealth: Payer: Self-pay | Admitting: Family Medicine

## 2013-12-25 MED ORDER — CYCLOBENZAPRINE HCL 10 MG PO TABS: ORAL_TABLET | ORAL | Status: DC

## 2013-12-25 NOTE — Telephone Encounter (Signed)
OK to RF as previously prescribed, with 5 additional RFs.-thx

## 2013-12-25 NOTE — Telephone Encounter (Signed)
Medication sent to pharmacy  

## 2013-12-25 NOTE — Telephone Encounter (Signed)
Pharmacy requesting refills of patient's flexeril.  Patient last seen 11/12/13.  Last Rx 06/29/13 x 5 refills.  Please advise.

## 2014-01-04 ENCOUNTER — Other Ambulatory Visit: Payer: Self-pay

## 2014-01-04 MED ORDER — FENOFIBRATE 160 MG PO TABS: ORAL_TABLET | ORAL | Status: DC

## 2014-01-19 ENCOUNTER — Other Ambulatory Visit: Payer: Self-pay | Admitting: Family Medicine

## 2014-01-19 NOTE — Telephone Encounter (Signed)
Refill request faxed from pharmacy for cyclobenzaprine.   Patient already had this filled 12/25/13 x 5 refill.  Contacted pharmacy to verify and found out this was an accidental fax request.

## 2014-02-03 ENCOUNTER — Telehealth: Payer: Self-pay | Admitting: Family Medicine

## 2014-02-03 MED ORDER — HYDROCODONE-ACETAMINOPHEN 5-325 MG PO TABS: ORAL_TABLET | ORAL | Status: DC

## 2014-02-03 NOTE — Telephone Encounter (Signed)
Pt aware.  Pt's husband will pick Rx up tomorrow.

## 2014-02-03 NOTE — Telephone Encounter (Signed)
Patient called stating that she will run out of hydrocodone before her follow up.  Patient last seen 11/12/13.  Last Rx printed for 01/08/14.  Next OV is 03/11/14.  Please advise refill.

## 2014-02-03 NOTE — Telephone Encounter (Signed)
Hydrocodone/apap rx printed.

## 2014-03-09 ENCOUNTER — Other Ambulatory Visit: Payer: Self-pay | Admitting: *Deleted

## 2014-03-09 MED ORDER — ROSUVASTATIN CALCIUM 10 MG PO TABS: ORAL_TABLET | ORAL | Status: DC

## 2014-03-11 ENCOUNTER — Ambulatory Visit: Payer: Medicare Other | Admitting: Family Medicine

## 2014-03-11 ENCOUNTER — Encounter: Payer: Self-pay | Admitting: Nurse Practitioner

## 2014-03-11 ENCOUNTER — Ambulatory Visit (INDEPENDENT_AMBULATORY_CARE_PROVIDER_SITE_OTHER): Payer: Medicare Other | Admitting: Nurse Practitioner

## 2014-03-11 VITALS — BP 116/74 | HR 70 | Temp 98.4°F | Ht 65.0 in | Wt 186.0 lb

## 2014-03-11 DIAGNOSIS — R7309 Other abnormal glucose: Secondary | ICD-10-CM | POA: Diagnosis not present

## 2014-03-11 DIAGNOSIS — E785 Hyperlipidemia, unspecified: Secondary | ICD-10-CM | POA: Diagnosis not present

## 2014-03-11 DIAGNOSIS — R202 Paresthesia of skin: Secondary | ICD-10-CM | POA: Insufficient documentation

## 2014-03-11 DIAGNOSIS — F411 Generalized anxiety disorder: Secondary | ICD-10-CM

## 2014-03-11 DIAGNOSIS — M549 Dorsalgia, unspecified: Secondary | ICD-10-CM

## 2014-03-11 DIAGNOSIS — G8929 Other chronic pain: Secondary | ICD-10-CM

## 2014-03-11 DIAGNOSIS — R7303 Prediabetes: Secondary | ICD-10-CM

## 2014-03-11 DIAGNOSIS — R209 Unspecified disturbances of skin sensation: Secondary | ICD-10-CM

## 2014-03-11 MED ORDER — ROSUVASTATIN CALCIUM 10 MG PO TABS: ORAL_TABLET | ORAL | Status: DC

## 2014-03-11 MED ORDER — HYDROCODONE-ACETAMINOPHEN 5-325 MG PO TABS: ORAL_TABLET | ORAL | Status: DC

## 2014-03-11 MED ORDER — LORAZEPAM 1 MG PO TABS: ORAL_TABLET | ORAL | Status: DC

## 2014-03-11 NOTE — Progress Notes (Signed)
Subjective:     Brenda [name] is a 57 y.o. female who presents for follow up of chronic health conditions:hyperlipidemia, well controlled with fenofibrate & crestor, tolerating without SE; prediabetes, stable: last 2 A1C 5.8; anxiety for which she takes ativan PRN; and chronic low back pain for which she has had 2 back surgeries, takes hydrocodone PRN, does daily back stretches & stays active. She is compliant with all meds. She c/o paresthesia soles of both feet for about 2 mos. Discomfort is intermittent and last 30 minutes or so. Episodes occur with & without activity. She cannot identify triggers that make it worse nor interventions that make it better.  The following portions of the patient's history were reviewed and updated as appropriate: allergies, current medications, past medical history, past social history, past surgical history and problem list.  Review of Systems Pertinent items are noted in HPI.    Objective:    BP 116/74  Pulse 70  Temp(Src) 98.4 F (36.9 C) (Temporal)  Ht 5\' 5"  (1.651 m)  Wt 186 lb (84.369 kg)  BMI 30.95 kg/m2  SpO2 94% BP 116/74  Pulse 70  Temp(Src) 98.4 F (36.9 C) (Temporal)  Ht 5\' 5"  (1.651 m)  Wt 186 lb (84.369 kg)  BMI 30.95 kg/m2  SpO2 94% General appearance: alert, cooperative, appears older than stated age and no distress Head: Normocephalic, without obvious abnormality, atraumatic Eyes: negative findings: lids and lashes normal and conjunctivae and sclerae normal Back: well healed, old scar noted midline over lumbar spine, mild swelling at top of scar. Mild spinal tenderness over scar & r adjacent to top of scar. No SIJ tenderness.  Extremities: extremities normal, atraumatic, no cyanosis or edema Pulses: 2+ and symmetric Skin: Skin color, texture, turgor normal. No rashes or lesions    Assessment:    1. Anxiety state, unspecified - LORazepam (ATIVAN) 1 MG tablet; TAKE 1 TABLET 2 TIMES A DAY AS NEEDED FOR ANXIETY  Dispense: 30  tablet; Refill: 4  2. Chronic back pain - HYDROcodone-acetaminophen (NORCO/VICODIN) 5-325 MG per tablet; take 1 tablet by mouth every 6 hours if needed for pain  Dispense: 90 tablet; Refill: 0  3. Other and unspecified hyperlipidemia - rosuvastatin (CRESTOR) 10 MG tablet; take 1 tablet by mouth once daily  Dispense: 30 tablet; Refill: 5 -fenofibrate 4. Prediabetes  5. Paresthesia of both feet  See problem list for complete A&P F/u 3 mos.

## 2014-03-11 NOTE — Assessment & Plan Note (Signed)
Continue ativan PRN

## 2014-03-11 NOTE — Assessment & Plan Note (Signed)
C/o paresthesia bottom of feet. Intermittent. Mild. Continue daily walking & stretching.

## 2014-03-11 NOTE — Assessment & Plan Note (Signed)
Cut out refined sugar. Walk daily. F/u 3 mos. Labs next visit.

## 2014-03-11 NOTE — Progress Notes (Signed)
Pre visit review using our clinic review tool, if applicable. No additional management support is needed unless otherwise documented below in the visit note. 

## 2014-03-11 NOTE — Assessment & Plan Note (Signed)
DD: back radiculopathy, B12 deficiency Pt declined B12 level today. Offer again at next lab draw. Continue daily back stretches & walking.

## 2014-03-11 NOTE — Patient Instructions (Signed)
You look great. All numbers looked good in January. Dr Anitra Lauth may want to check labs at next visit. Tingling sensation in feet may be related to your back or could be Vitamin deficiency-B12 or D. Consider getting those checked with next lab draws. Continue back & hip stretches daily.  Nice to meet you!

## 2014-04-08 ENCOUNTER — Telehealth: Payer: Self-pay | Admitting: Family Medicine

## 2014-04-08 DIAGNOSIS — M549 Dorsalgia, unspecified: Principal | ICD-10-CM

## 2014-04-08 DIAGNOSIS — G8929 Other chronic pain: Secondary | ICD-10-CM

## 2014-04-08 MED ORDER — HYDROCODONE-ACETAMINOPHEN 5-325 MG PO TABS: ORAL_TABLET | ORAL | Status: DC

## 2014-04-08 NOTE — Telephone Encounter (Signed)
Vicodin rx printed. 

## 2014-04-08 NOTE — Telephone Encounter (Signed)
Pt aware rx is ready for pick up.   Pt's husband will be by tomorrow.

## 2014-04-08 NOTE — Telephone Encounter (Signed)
Pt requesting vicodin rf.  Last Rx said to fill on or after 02/06/14 for # 90.  Last OV was 5/7.  Please advise rf.

## 2014-04-08 NOTE — Telephone Encounter (Signed)
Can patient's husband pick up hard copy tomorrow?

## 2014-05-08 ENCOUNTER — Encounter (HOSPITAL_COMMUNITY): Payer: Self-pay | Admitting: Emergency Medicine

## 2014-05-08 ENCOUNTER — Emergency Department (HOSPITAL_COMMUNITY)
Admission: EM | Admit: 2014-05-08 | Discharge: 2014-05-08 | Disposition: A | Discharge status: Home / Self Care | Payer: Medicare Other | Attending: Emergency Medicine | Admitting: Emergency Medicine

## 2014-05-08 ENCOUNTER — Emergency Department (HOSPITAL_COMMUNITY): Payer: Medicare Other

## 2014-05-08 DIAGNOSIS — Z8719 Personal history of other diseases of the digestive system: Secondary | ICD-10-CM | POA: Diagnosis not present

## 2014-05-08 DIAGNOSIS — F17200 Nicotine dependence, unspecified, uncomplicated: Secondary | ICD-10-CM | POA: Diagnosis not present

## 2014-05-08 DIAGNOSIS — J449 Chronic obstructive pulmonary disease, unspecified: Secondary | ICD-10-CM | POA: Diagnosis not present

## 2014-05-08 DIAGNOSIS — Z880 Allergy status to penicillin: Secondary | ICD-10-CM | POA: Insufficient documentation

## 2014-05-08 DIAGNOSIS — M1710 Unilateral primary osteoarthritis, unspecified knee: Secondary | ICD-10-CM | POA: Insufficient documentation

## 2014-05-08 DIAGNOSIS — R079 Chest pain, unspecified: Secondary | ICD-10-CM | POA: Insufficient documentation

## 2014-05-08 DIAGNOSIS — J4489 Other specified chronic obstructive pulmonary disease: Secondary | ICD-10-CM | POA: Insufficient documentation

## 2014-05-08 DIAGNOSIS — E785 Hyperlipidemia, unspecified: Secondary | ICD-10-CM | POA: Insufficient documentation

## 2014-05-08 DIAGNOSIS — IMO0002 Reserved for concepts with insufficient information to code with codable children: Secondary | ICD-10-CM | POA: Insufficient documentation

## 2014-05-08 DIAGNOSIS — Z79899 Other long term (current) drug therapy: Secondary | ICD-10-CM | POA: Insufficient documentation

## 2014-05-08 DIAGNOSIS — G8929 Other chronic pain: Secondary | ICD-10-CM | POA: Diagnosis not present

## 2014-05-08 DIAGNOSIS — Z87440 Personal history of urinary (tract) infections: Secondary | ICD-10-CM | POA: Insufficient documentation

## 2014-05-08 DIAGNOSIS — R110 Nausea: Secondary | ICD-10-CM | POA: Diagnosis not present

## 2014-05-08 LAB — COMPREHENSIVE METABOLIC PANEL
ALT: 14 U/L (ref 0–35)
ANION GAP: 13 (ref 5–15)
AST: 17 U/L (ref 0–37)
Albumin: 4 g/dL (ref 3.5–5.2)
Alkaline Phosphatase: 49 U/L (ref 39–117)
BUN: 16 mg/dL (ref 6–23)
CALCIUM: 9.6 mg/dL (ref 8.4–10.5)
CO2: 24 mEq/L (ref 19–32)
CREATININE: 0.6 mg/dL (ref 0.50–1.10)
Chloride: 106 mEq/L (ref 96–112)
GFR calc Af Amer: >90 mL/min (ref >90)
GFR calc non Af Amer: >90 mL/min (ref >90)
Glucose, Bld: 107 mg/dL — ABNORMAL HIGH (ref 70–99)
Potassium: 3.9 mEq/L (ref 3.7–5.3)
Sodium: 143 mEq/L (ref 137–147)
TOTAL PROTEIN: 7.2 g/dL (ref 6.0–8.3)
Total Bilirubin: 0.2 mg/dL — ABNORMAL LOW (ref 0.3–1.2)

## 2014-05-08 LAB — CBC WITH DIFFERENTIAL/PLATELET
BASOS PCT: 0 % (ref 0–1)
Basophils Absolute: 0 10*3/uL (ref 0.0–0.1)
EOS ABS: 0.2 10*3/uL (ref 0.0–0.7)
EOS PCT: 2 % (ref 0–5)
HCT: 40.3 % (ref 36.0–46.0)
HEMOGLOBIN: 13.9 g/dL (ref 12.0–15.0)
Lymphocytes Relative: 37 % (ref 12–46)
Lymphs Abs: 3.1 10*3/uL (ref 0.7–4.0)
MCH: 31.3 pg (ref 26.0–34.0)
MCHC: 34.5 g/dL (ref 30.0–36.0)
MCV: 90.8 fL (ref 78.0–100.0)
Monocytes Absolute: 0.5 10*3/uL (ref 0.1–1.0)
Monocytes Relative: 5 % (ref 3–12)
Neutro Abs: 4.6 10*3/uL (ref 1.7–7.7)
Neutrophils Relative %: 56 % (ref 43–77)
PLATELETS: 255 10*3/uL (ref 150–400)
RBC: 4.44 MIL/uL (ref 3.87–5.11)
RDW: 13.1 % (ref 11.5–15.5)
WBC: 8.4 10*3/uL (ref 4.0–10.5)

## 2014-05-08 LAB — LIPASE, BLOOD: Lipase: 37 U/L (ref 11–59)

## 2014-05-08 LAB — TROPONIN I
Troponin I: <0.3 ng/mL (ref <0.30)
Troponin I: <0.3 ng/mL (ref <0.30)

## 2014-05-08 LAB — PRO B NATRIURETIC PEPTIDE: Pro B Natriuretic peptide (BNP): 36.4 pg/mL (ref 0–125)

## 2014-05-08 MED ORDER — MORPHINE SULFATE 2 MG/ML IJ SOLN
INTRAMUSCULAR | Status: AC
  Filled 2014-05-08: qty 1

## 2014-05-08 MED ORDER — IOHEXOL 350 MG/ML SOLN
100.0000 mL | Freq: Once | INTRAVENOUS | Status: AC | PRN
  Administered 2014-05-08: 100 mL via INTRAVENOUS

## 2014-05-08 MED ORDER — ASPIRIN 81 MG PO CHEW
324.0000 mg | CHEWABLE_TABLET | Freq: Once | ORAL | Status: AC
  Administered 2014-05-08: 324 mg via ORAL

## 2014-05-08 MED ORDER — SODIUM CHLORIDE 0.9 % IJ SOLN
INTRAMUSCULAR | Status: AC
  Filled 2014-05-08: qty 50

## 2014-05-08 MED ORDER — SODIUM CHLORIDE 0.9 % IJ SOLN
INTRAMUSCULAR | Status: AC
  Filled 2014-05-08: qty 250

## 2014-05-08 MED ORDER — NITROGLYCERIN 0.4 MG SL SUBL
0.4000 mg | SUBLINGUAL_TABLET | SUBLINGUAL | Status: DC | PRN
  Administered 2014-05-08 (×2): 0.4 mg via SUBLINGUAL
  Filled 2014-05-08: qty 1

## 2014-05-08 MED ORDER — ONDANSETRON HCL 4 MG/2ML IJ SOLN
4.0000 mg | Freq: Once | INTRAMUSCULAR | Status: AC
  Administered 2014-05-08: 4 mg via INTRAVENOUS

## 2014-05-08 MED ORDER — MORPHINE SULFATE 2 MG/ML IJ SOLN
2.0000 mg | Freq: Once | INTRAMUSCULAR | Status: AC
  Administered 2014-05-08: 2 mg via INTRAVENOUS

## 2014-05-08 MED ORDER — SODIUM CHLORIDE 0.9 % IV SOLN
INTRAVENOUS | Status: AC
  Filled 2014-05-08: qty 50

## 2014-05-08 MED ORDER — LORAZEPAM 2 MG/ML IJ SOLN
1.0000 mg | Freq: Once | INTRAMUSCULAR | Status: AC
  Administered 2014-05-08: 1 mg via INTRAVENOUS

## 2014-05-08 MED ORDER — ONDANSETRON HCL 4 MG/2ML IJ SOLN
4.0000 mg | Freq: Once | INTRAMUSCULAR | Status: AC
  Administered 2014-05-08: 4 mg via INTRAVENOUS
  Filled 2014-05-08: qty 2

## 2014-05-08 MED ORDER — SODIUM CHLORIDE 0.9 % IJ SOLN
INTRAMUSCULAR | Status: AC
  Filled 2014-05-08: qty 10

## 2014-05-08 MED ORDER — ASPIRIN 81 MG PO CHEW
CHEWABLE_TABLET | ORAL | Status: DC
Start: 2014-05-08 — End: 2014-05-08
  Filled 2014-05-08: qty 4

## 2014-05-08 MED ORDER — SODIUM CHLORIDE 0.9 % IV BOLUS (SEPSIS)
250.0000 mL | Freq: Once | INTRAVENOUS | Status: AC
  Administered 2014-05-08: 250 mL via INTRAVENOUS

## 2014-05-08 MED ORDER — SODIUM CHLORIDE 0.9 % IV SOLN
INTRAVENOUS | Status: DC
  Administered 2014-05-08: 16:00:00 via INTRAVENOUS

## 2014-05-08 NOTE — Discharge Instructions (Signed)
Her Chest Pain (Nonspecific) It is often hard to give a diagnosis for the cause of chest pain. There is always a chance that your pain could be related to something serious, such as a heart attack or a blood clot in the lungs. You need to follow up with your doctor. HOME CARE  If antibiotic medicine was given, take it as directed by your doctor. Finish the medicine even if you start to feel better.  For the next few days, avoid activities that bring on chest pain. Continue physical activities as told by your doctor.  Do not use any tobacco products. This includes cigarettes, chewing tobacco, and e-cigarettes.  Avoid drinking alcohol.  Only take medicine as told by your doctor.  Follow your doctor's suggestions for more testing if your chest pain does not go away.  Keep all doctor visits you made. GET HELP IF:  Your chest pain does not go away, even after treatment.  You have a rash with blisters on your chest.  You have a fever. GET HELP RIGHT AWAY IF:   You have more pain or pain that spreads to your arm, neck, jaw, back, or belly (abdomen).  You have shortness of breath.  You cough more than usual or cough up blood.  You have very bad back or belly pain.  You feel sick to your stomach (nauseous) or throw up (vomit).  You have very bad weakness.  You pass out (faint).  You have chills. This is an emergency. Do not wait to see if the problems will go away. Call your local emergency services (911 in [place] Do not drive yourself to the hospital. MAKE SURE YOU:   Understand these instructions.  Will watch your condition.  Will get help right away if you are not doing well or get worse. Document Released: 04/09/2008 Document Revised: 10/27/2013 Document Reviewed: 04/09/2008 [facility] Patient Information 2015 [place], [place]. This information is not intended to replace advice given to you by your health care provider. Make sure you discuss any questions you have with your  health care provider.  Recommend followup with cardiology call and make an appointment. Today's workup without any significant findings. Return for any new or worse symptoms. Okay to take the hydrocodone pain medicine that she had at home for the chest pain. I would also recommend taking a baby aspirin a day.

## 2014-05-08 NOTE — ED Notes (Signed)
Notified Dr. Rogene Houston of unchanged pain rating and mild nausea relief.  Patient remains tearful.

## 2014-05-08 NOTE — ED Provider Notes (Signed)
CSN: [id number]     Arrival date & time 05/08/14  1523 History  This chart was scribed for Fredia Sorrow, MD by Martinique Peace, ED Scribe. The patient was seen in [place]. The patient's care was started at 3:31 PM.    Chief Complaint  Patient presents with  . Chest Pain      Patient is a 58 y.o. female presenting with chest pain. The history is provided by the patient. No language interpreter was used.  Chest Pain Pain location:  L chest Pain radiates to:  Does not radiate Pain radiates to the back: no   Pain severity now: 4/10. Duration:  2 weeks Timing: Intermittent for 2 weeks; constant for past 2 hrs. Progression:  Worsening Relieved by:  Nothing Associated symptoms: back pain and nausea   Associated symptoms: no abdominal pain, no cough, no dizziness, no fever, no headache, no shortness of breath and not vomiting   Risk factors: high cholesterol   Risk factors: no diabetes mellitus and no hypertension   HPI Comments: Brenda [name] is a 58 y.o. female who presents to the Emergency Department complaining of left sided CP onset 2 weeks ago that is described as dull and nagging. Pt states that the pain was intermittent over the course of the past 2 weeks but has increased in frequency until it started becoming constant 2 hours ago. Pt rates pain as 4/10. Pt also complains of feeling hot and nauseous. She states she has not taken anything to help alleviate symptoms. She denies SOB, fever, chills, or vomiting. Pt reports history of high cholesterol but denies hypertension or DM. Her SpO2 is 96%.    Past Medical History  Diagnosis Date  . Chronic LBP   . DDD (degenerative disc disease)     spinal stenosis  . Hyperlipidemia     mixed  . History of GI bleed     NSAIDS  . Recurrent UTI   . Microscopic hematuria     full w/ u unrevealing X 2  . Tobacco dependence     w/ only occasional bronchitis illness (CT shest 2009 nl- for f/u of ? nodule on CXR)  . Atypical chest pain  2007    cardiolyte neg, echo nl, cath showed mild/nonobstructive LAD disease  . Normal nuclear stress test 11/11    negative, EF normal preop  . Palpitations 2006    48H holter neg  . Osteoarthritis of knee 06/30/2012    Right   . COPD (chronic obstructive pulmonary disease)    Past Surgical History  Procedure Laterality Date  . Lumbar spine surgery  1993    right iliac crest bone graft+metal instrumentation; 2005 metal removal and decompression, 2011 lumbar decompression 4-11, then stabilization/ instrumentation done 09-19-10: L2,L3, L5 left and L2 , L3, L4 right pedical remnant L4 left embedded. Left iliac crest bone graft-- Dr Velna Ochs  . Appendectomy  1984  . Abdominal hysterectomy  1997    DUB  . Right oopherectomy      cyst  . Tonsillectomy  58 yrs old  . Knee arthroscopy      right and left  . Left wrist ganglion cyst excision  2010  . Tonsillectomy     Family History  Problem Relation Age of Onset  . Coronary artery disease Neg Hx   . Other Mother     heart and thyroid problems  . Hyperlipidemia Mother   . Diabetes Mother   . Cancer Mother     breast  .  Other Father     CHF  . Other Brother     heart, back problems  . Hypertension Brother   . Cancer Paternal Grandmother     mouth- snuff  . Heart attack Paternal Grandfather    History  Substance Use Topics  . Smoking status: Current Every Day Smoker -- 1.00 packs/day for 35 years    Types: Cigarettes  . Smokeless tobacco: Never Used  . Alcohol Use: No   OB History   Grav Para Term Preterm Abortions TAB SAB Ect Mult Living                 Review of Systems  Constitutional: Negative for fever and chills.  HENT: Negative for rhinorrhea and sore throat.   Eyes: Negative for visual disturbance.  Respiratory: Negative for cough and shortness of breath.   Cardiovascular: Positive for chest pain. Negative for leg swelling.  Gastrointestinal: Positive for nausea. Negative for vomiting, abdominal pain and  diarrhea.  Genitourinary: Negative for dysuria.  Musculoskeletal: Positive for back pain.  Skin: Negative for rash.  Neurological: Negative for dizziness, light-headedness and headaches.  Hematological: Does not bruise/bleed easily.  Psychiatric/Behavioral: Negative for confusion.      Allergies  Amitriptyline hcl; Aspirin; Beta adrenergic blockers; Nsaids; Penicillins; Prochlorperazine edisylate; and Sulfonamide derivatives  Home Medications   Prior to Admission medications   Medication Sig Start Date End Date Taking? Authorizing Provider  acetaminophen (TYLENOL) 160 MG/5ML solution Take 325 mg by mouth every 4 (four) hours as needed.      Historical Provider, MD  albuterol (PROVENTIL) (2.5 MG/3ML) 0.083% nebulizer solution Take 3 mLs (2.5 mg total) by nebulization every 6 (six) hours as needed for wheezing. 08/06/13   Tammi Sou, MD  albuterol (VENTOLIN HFA) 108 (90 BASE) MCG/ACT inhaler Inhale 2 puffs into the lungs every 6 (six) hours as needed for wheezing. 02/26/13 06/20/14  Tammi Sou, MD  Alum & Mag Hydroxide-Simeth (GI COCKTAIL) SUSP suspension Take 15 mLs by mouth 3 (three) times daily as needed (mouth and esophagus pain). Shake well. 08/22/13   Evalee Jefferson, PA-C  cyclobenzaprine (FLEXERIL) 10 MG tablet take 1 tablet by mouth every 8 hours if needed 12/25/13   Tammi Sou, MD  esomeprazole (NEXIUM) 40 MG capsule Take 1 capsule (40 mg total) by mouth daily. 10/25/11   Tammi Sou, MD  fenofibrate 160 MG tablet take 1 tablet by mouth once daily with food 01/04/14   Tammi Sou, MD  HYDROcodone-acetaminophen (NORCO/VICODIN) 5-325 MG per tablet take 1 tablet by mouth every 6 hours if needed for pain 04/08/14   Tammi Sou, MD  LORazepam (ATIVAN) 1 MG tablet TAKE 1 TABLET 2 TIMES A DAY AS NEEDED FOR ANXIETY 03/11/14   Irene Pap, NP  rosuvastatin (CRESTOR) 10 MG tablet take 1 tablet by mouth once daily 03/11/14   Irene Pap, NP   Triage Vitals: BP 152/69   Pulse 85  Temp(Src) 98.1 F (36.7 C) (Oral)  Resp 20  Ht 5\' 5"  (1.651 m)  Wt 176 lb (79.833 kg)  BMI 29.29 kg/m2  SpO2 96%  Physical Exam  Constitutional: She is oriented to person, place, and time.  HENT:  Head: Normocephalic and atraumatic.  Eyes: Conjunctivae and EOM are normal.  Neck: Neck supple.  Cardiovascular: Normal rate, regular rhythm and normal heart sounds.   No murmur heard. Pulmonary/Chest: She exhibits no tenderness.  Abdominal: Soft. Bowel sounds are normal.  Neurological: She is alert and  oriented to person, place, and time. No cranial nerve deficit. She exhibits normal muscle tone. Coordination normal.  Skin: Skin is warm.    ED Course  Procedures (including critical care time) DIAGNOSTIC STUDIES: Oxygen Saturation is 96% on room air, adequate by my interpretation.    COORDINATION OF CARE: 3:37 PM- Treatment plan was discussed with patient who verbalizes understanding and agrees.    Labs Review Labs Reviewed  PRO B NATRIURETIC PEPTIDE  COMPREHENSIVE METABOLIC PANEL  CBC WITH DIFFERENTIAL  LIPASE, BLOOD   Results for orders placed during the hospital encounter of 05/08/14  PRO B NATRIURETIC PEPTIDE      Result Value Ref Range   Pro B Natriuretic peptide (BNP) 36.4  0 - 125 pg/mL  COMPREHENSIVE METABOLIC PANEL      Result Value Ref Range   Sodium 143  137 - 147 mEq/L   Potassium 3.9  3.7 - 5.3 mEq/L   Chloride 106  96 - 112 mEq/L   CO2 24  19 - 32 mEq/L   Glucose, Bld 107 (*) 70 - 99 mg/dL   BUN 16  6 - 23 mg/dL   Creatinine, Ser 0.60  0.50 - 1.10 mg/dL   Calcium 9.6  8.4 - 10.5 mg/dL   Total Protein 7.2  6.0 - 8.3 g/dL   Albumin 4.0  3.5 - 5.2 g/dL   AST 17  0 - 37 U/L   ALT 14  0 - 35 U/L   Alkaline Phosphatase 49  39 - 117 U/L   Total Bilirubin 0.2 (*) 0.3 - 1.2 mg/dL   GFR calc non Af Amer >90  >90 mL/min   GFR calc Af Amer >90  >90 mL/min   Anion gap 13  5 - 15  CBC WITH DIFFERENTIAL      Result Value Ref Range   WBC 8.4  4.0 - 10.5  K/uL   RBC 4.44  3.87 - 5.11 MIL/uL   Hemoglobin 13.9  12.0 - 15.0 g/dL   HCT 40.3  36.0 - 46.0 %   MCV 90.8  78.0 - 100.0 fL   MCH 31.3  26.0 - 34.0 pg   MCHC 34.5  30.0 - 36.0 g/dL   RDW 13.1  11.5 - 15.5 %   Platelets 255  150 - 400 K/uL   Neutrophils Relative % 56  43 - 77 %   Neutro Abs 4.6  1.7 - 7.7 K/uL   Lymphocytes Relative 37  12 - 46 %   Lymphs Abs 3.1  0.7 - 4.0 K/uL   Monocytes Relative 5  3 - 12 %   Monocytes Absolute 0.5  0.1 - 1.0 K/uL   Eosinophils Relative 2  0 - 5 %   Eosinophils Absolute 0.2  0.0 - 0.7 K/uL   Basophils Relative 0  0 - 1 %   Basophils Absolute 0.0  0.0 - 0.1 K/uL  LIPASE, BLOOD      Result Value Ref Range   Lipase 37  11 - 59 U/L  TROPONIN I      Result Value Ref Range   Troponin I <0.30  <0.30 ng/mL  TROPONIN I      Result Value Ref Range   Troponin I <0.30  <0.30 ng/mL     Imaging Review No results found.   EKG Interpretation   Date/Time:  Saturday May 08 2014 15:33:39 EDT Ventricular Rate:  81 PR Interval:  144 QRS Duration: 86 QT Interval:  380 QTC Calculation: 441 R  Axis:   28 Text Interpretation:  Normal sinus rhythm Possible Anterior infarct , age  undetermined Abnormal ECG No significant change since last tracing  Confirmed by Mao Lockner  MD, Keaundre Thelin [phone]) on 05/08/2014 3:41:33 PM     Medications  ondansetron (ZOFRAN) injection 4 mg (not administered)  0.9 %  sodium chloride infusion (not administered)  nitroGLYCERIN (NITROSTAT) SL tablet 0.4 mg (not administered)  sodium chloride 0.9 % bolus 250 mL (not administered)  aspirin chewable tablet 324 mg (not administered)  aspirin 81 MG chewable tablet (not administered)    MDM   Final diagnoses:  None    Patient with 2 weeks worth of left-sided chest pain.at worst today of increased frequency today for the last 2 hours prior to arrival it was nonstop. Associated with nausea. No shortness of breath no dizziness. Extensive workup here without any acute cardiac  findings. Troponins x2 were negative. Patient still not chest pain-free but is down to 2. EKG had no acute changes chest x-ray was negative for bony pneumothorax supple Mary edema. CT angios was done also negative for pulmonary embolus. Feel that this is not cardiac chest pain. Based on the duration of the pain into negative troponins it would be highly unlikely that has been an acute cardiac event. Troponins were 3 hours apart. Patient has been followed by cardiology in the past and will refer her back for followup care. Patient return for any newer worse symptoms. Will recommend a baby aspirin a day. Patient does have pain medication at home.  I personally performed the services described in this documentation, which was scribed in my presence. The recorded information has been reviewed and is accurate.    Fredia Sorrow, MD 05/08/14 1944

## 2014-05-08 NOTE — ED Notes (Signed)
Patient with no complaints at this time. Respirations even and unlabored. Skin warm/dry. Discharge instructions reviewed with patient at this time. Patient given opportunity to voice concerns/ask questions. IV removed per policy and band-aid applied to site. Patient discharged at this time and left Emergency Department with steady gait.  

## 2014-05-08 NOTE — ED Notes (Signed)
Intermittent dull nagging L chest pain x 2 weeks, becoming worse over last 2 hours associated w/nausea and vomiting.  Denies dizziness, or SOB.  Dr. Rogene Houston at bedside.

## 2014-05-10 ENCOUNTER — Other Ambulatory Visit: Payer: Self-pay | Admitting: Family Medicine

## 2014-05-10 NOTE — Telephone Encounter (Signed)
Refill request for Flexeril Last filled by MD on 12/25/2013 #30 x5 Last Appt: 03/11/2014 Next Appt: 07/14/2014 Please advise refill? Called pharmacy to check if pt had anymore refills? Pharmacy said pt has used all refill.

## 2014-05-10 NOTE — Telephone Encounter (Signed)
Patient is requesting refill on Norco, husband can pick up tomorrow. She is also out of Flexeril that can be called to [facility]

## 2014-05-11 ENCOUNTER — Other Ambulatory Visit: Payer: Self-pay | Admitting: Family Medicine

## 2014-05-11 DIAGNOSIS — G8929 Other chronic pain: Secondary | ICD-10-CM

## 2014-05-11 DIAGNOSIS — M549 Dorsalgia, unspecified: Principal | ICD-10-CM

## 2014-05-11 MED ORDER — CYCLOBENZAPRINE HCL 10 MG PO TABS: ORAL_TABLET | ORAL | Status: DC

## 2014-05-11 MED ORDER — HYDROCODONE-ACETAMINOPHEN 5-325 MG PO TABS: ORAL_TABLET | ORAL | Status: DC

## 2014-06-05 HISTORY — PX: CARDIOVASCULAR STRESS TEST: SHX262

## 2014-06-07 ENCOUNTER — Other Ambulatory Visit: Payer: Self-pay | Admitting: Family Medicine

## 2014-06-07 MED ORDER — FENOFIBRATE 160 MG PO TABS: ORAL_TABLET | ORAL | Status: DC

## 2014-06-14 ENCOUNTER — Telehealth: Payer: Self-pay | Admitting: Family Medicine

## 2014-06-14 DIAGNOSIS — G8929 Other chronic pain: Secondary | ICD-10-CM

## 2014-06-14 DIAGNOSIS — M549 Dorsalgia, unspecified: Principal | ICD-10-CM

## 2014-06-14 MED ORDER — HYDROCODONE-ACETAMINOPHEN 5-325 MG PO TABS: ORAL_TABLET | ORAL | Status: DC

## 2014-06-14 NOTE — Telephone Encounter (Signed)
Patient last rx was dated 05/11/14 but patient hasn't been seen by you since 11/12/13.  Please advise rf.

## 2014-06-14 NOTE — Telephone Encounter (Signed)
Pt's husband, Alvester Chou to p/u Rx tomorrow.

## 2014-06-14 NOTE — Telephone Encounter (Signed)
Patient wants to know if her husband can pick up her Rx today.

## 2014-06-14 NOTE — Telephone Encounter (Signed)
Rx printed. Pls have pt schedule routine f/u appt sometime in the next 1 month.-thx

## 2014-06-23 ENCOUNTER — Encounter: Payer: Self-pay | Admitting: Internal Medicine

## 2014-06-23 ENCOUNTER — Ambulatory Visit (INDEPENDENT_AMBULATORY_CARE_PROVIDER_SITE_OTHER): Payer: Medicare Other | Admitting: Internal Medicine

## 2014-06-23 VITALS — BP 124/72 | HR 72 | Ht 65.0 in | Wt 175.6 lb

## 2014-06-23 DIAGNOSIS — R0789 Other chest pain: Secondary | ICD-10-CM

## 2014-06-23 DIAGNOSIS — R072 Precordial pain: Secondary | ICD-10-CM

## 2014-06-23 DIAGNOSIS — I2510 Atherosclerotic heart disease of native coronary artery without angina pectoris: Secondary | ICD-10-CM | POA: Diagnosis not present

## 2014-06-23 DIAGNOSIS — E789 Disorder of lipoprotein metabolism, unspecified: Secondary | ICD-10-CM

## 2014-06-23 DIAGNOSIS — F17200 Nicotine dependence, unspecified, uncomplicated: Secondary | ICD-10-CM | POA: Diagnosis not present

## 2014-06-23 DIAGNOSIS — I209 Angina pectoris, unspecified: Secondary | ICD-10-CM

## 2014-06-23 DIAGNOSIS — I25119 Atherosclerotic heart disease of native coronary artery with unspecified angina pectoris: Secondary | ICD-10-CM

## 2014-06-23 DIAGNOSIS — E785 Hyperlipidemia, unspecified: Secondary | ICD-10-CM

## 2014-06-23 NOTE — Patient Instructions (Signed)
Your physician has requested that you have a lexiscan myoview. For further information please visit [email or web address]. Please follow instruction sheet, as given.  Your physician recommends that you schedule a follow-up appointment after your stress test.   

## 2014-06-25 ENCOUNTER — Encounter: Payer: Self-pay | Admitting: Internal Medicine

## 2014-06-25 DIAGNOSIS — I2510 Atherosclerotic heart disease of native coronary artery without angina pectoris: Secondary | ICD-10-CM | POA: Insufficient documentation

## 2014-06-25 DIAGNOSIS — R079 Chest pain, unspecified: Secondary | ICD-10-CM | POA: Insufficient documentation

## 2014-06-25 NOTE — Progress Notes (Signed)
OFFICE NOTE  Chief Complaint:  Chest pain  Primary Care Physician: Tammi Sou, MD  HPI:  Brenda [name] is a pleasant 58 year old female, referred to me for evaluation of chest pain. She had a previous evaluation of chest pain by Dr. Leslye Peer about 10 years ago including a cardiac catheterization that showed a moderate stenosis and one-vessel. No stents were placed at that time. There is a family history of heart disease and her father had a heart attack in his 72s. Briefly she been having some chest pain and went to the emergency department at the end of June in any pain. She was out for MI and was sent home. She's also had progressive chest tightness with minimal exertion. The other night she had chest pain which woke her up and this is somewhat worrisome for her. Just he has dyslipidemia but is well treated on Crestor and fenofibrate. She does have an allergy to aspirin and is not currently on any antiplatelet therapy.  PMHx:  Past Medical History  Diagnosis Date  . Chronic LBP   . DDD (degenerative disc disease)     spinal stenosis  . Hyperlipidemia     mixed  . History of GI bleed     NSAIDS  . Recurrent UTI   . Microscopic hematuria     full w/ u unrevealing X 2  . Tobacco dependence     w/ only occasional bronchitis illness (CT shest 2009 nl- for f/u of ? nodule on CXR)  . Atypical chest pain 2007    cardiolyte neg, echo nl, cath showed mild/nonobstructive LAD disease  . Normal nuclear stress test 11/11    negative, EF normal preop  . Palpitations 2006    48H holter neg  . Osteoarthritis of knee 06/30/2012    Right   . COPD (chronic obstructive pulmonary disease)     Past Surgical History  Procedure Laterality Date  . Lumbar spine surgery  1993    right iliac crest bone graft+metal instrumentation; 2005 metal removal and decompression, 2011 lumbar decompression 4-11, then stabilization/ instrumentation done 09-19-10: L2,L3, L5 left and L2 , L3, L4 right  pedical remnant L4 left embedded. Left iliac crest bone graft-- Dr Velna Ochs  . Appendectomy  1984  . Abdominal hysterectomy  1997    DUB  . Right oopherectomy      cyst  . Tonsillectomy  58 yrs old  . Knee arthroscopy      right and left  . Left wrist ganglion cyst excision  2010  . Tonsillectomy    . Cardiac catheterization  2000s    FAMHx:  Family History  Problem Relation Age of Onset  . Coronary artery disease Neg Hx   . Heart Problems Mother     and thyroid problems  . Hyperlipidemia Mother   . Diabetes Mother   . Breast cancer Mother   . Heart failure Father     CHF, heart attack  . Heart disease Brother     back problems  . Hypertension Brother   . Cancer Paternal Grandmother     mouth - snuff  . Heart attack Paternal Grandfather     stroke, HTN  . Hyperlipidemia Brother   . Heart attack Brother   . Cancer Brother   . Stroke Maternal Grandfather   . Kidney failure Maternal Grandmother     SOCHx:   reports that she has been smoking Cigarettes.  She has a 35 pack-year smoking history. She has  never used smokeless tobacco. She reports that she does not drink alcohol or use illicit drugs.  ALLERGIES:  Allergies  Allergen Reactions  . Aspirin Other (See Comments)    REACTION: GI Bleed  . Beta Adrenergic Blockers Other (See Comments)    REACTION: decreased libido  . Nsaids Other (See Comments)    REACTION: GI Upset  . Penicillins Swelling  . Prochlorperazine Edisylate Other (See Comments)    REACTION: Stroke like symptoms  . Sulfonamide Derivatives Other (See Comments)    REACTION: unsure rxn  . Amitriptyline Hcl Palpitations    REACTION: increased heart rate    ROS: A comprehensive review of systems was negative except for: Cardiovascular: positive for chest pain  HOME MEDS: Current Outpatient Prescriptions  Medication Sig Dispense Refill  . cyclobenzaprine (FLEXERIL) 10 MG tablet take 1 tablet by mouth every 8 hours if needed  30 tablet  5  .  esomeprazole (NEXIUM) 40 MG capsule Take 1 capsule (40 mg total) by mouth daily.  30 capsule  10  . fenofibrate 160 MG tablet take 1 tablet by mouth once daily with food  30 tablet  3  . HYDROcodone-acetaminophen (NORCO/VICODIN) 5-325 MG per tablet take 1 tablet by mouth every 6 hours if needed for pain  90 tablet  0  . LORazepam (ATIVAN) 1 MG tablet Take 1-2 mg by mouth daily as needed for anxiety.      . rosuvastatin (CRESTOR) 10 MG tablet take 1 tablet by mouth once daily  30 tablet  5   No current facility-administered medications for this visit.    LABS/IMAGING: No results found for this or any previous visit (from the past 48 hour(s)). No results found.  VITALS: BP 124/72  Pulse 72  Ht 5\' 5"  (1.651 m)  Wt 175 lb 9.6 oz (79.652 kg)  BMI 29.22 kg/m2  EXAM: General appearance: alert and no distress Neck: no carotid bruit, no JVD, thyroid not enlarged, symmetric, no tenderness/mass/nodules and . Lungs: clear to auscultation bilaterally Heart: regular rate and rhythm, S1, S2 normal, no murmur, click, rub or gallop Abdomen: soft, non-tender; bowel sounds normal; no masses,  no organomegaly Extremities: extremities normal, atraumatic, no cyanosis or edema Pulses: 2+ and symmetric Skin: Skin color, texture, turgor normal. No rashes or lesions Neurologic: Grossly normal PSych: Normal  EKG: Normal sinus rhythm at 72  ASSESSMENT: 1. Chest pain 2. History of moderate coronary artery disease 3. Dyslipidemia 4. Family history of premature coronary disease  PLAN: 6.   Mrs. Tri has been describing worsening chest pain and has a history of moderate nonobstructive coronary disease about 10 years ago by cath. Chest is dyslipidemia and a family history of heart disease. I would recommend a nuclear stress test to further evaluate for ischemia. Plan to see her back to discuss results of that study.  Thank you again for the kind referral.  Pixie Casino, MD, [facility] Attending  Cardiologist [facility]  Tahiry Spicer C 06/25/2014, 6:34 PM

## 2014-06-28 ENCOUNTER — Encounter: Payer: Self-pay | Admitting: Family Medicine

## 2014-06-29 ENCOUNTER — Ambulatory Visit (HOSPITAL_COMMUNITY)
Admission: RE | Admit: 2014-06-29 | Discharge: 2014-06-29 | Disposition: A | Discharge status: Home / Self Care | Payer: Medicare Other | Source: Ambulatory Visit | Attending: Cardiology | Admitting: Cardiology

## 2014-06-29 DIAGNOSIS — E789 Disorder of lipoprotein metabolism, unspecified: Secondary | ICD-10-CM | POA: Insufficient documentation

## 2014-06-29 DIAGNOSIS — F17200 Nicotine dependence, unspecified, uncomplicated: Secondary | ICD-10-CM | POA: Diagnosis not present

## 2014-06-29 DIAGNOSIS — I2510 Atherosclerotic heart disease of native coronary artery without angina pectoris: Secondary | ICD-10-CM

## 2014-06-29 DIAGNOSIS — R0789 Other chest pain: Secondary | ICD-10-CM | POA: Diagnosis not present

## 2014-06-29 MED ORDER — TECHNETIUM TC 99M SESTAMIBI GENERIC - CARDIOLITE
9.8000 | Freq: Once | INTRAVENOUS | Status: AC | PRN
  Administered 2014-06-29: 10 via INTRAVENOUS

## 2014-06-29 MED ORDER — REGADENOSON 0.4 MG/5ML IV SOLN
0.4000 mg | Freq: Once | INTRAVENOUS | Status: AC
  Administered 2014-06-29: 0.4 mg via INTRAVENOUS

## 2014-06-29 MED ORDER — TECHNETIUM TC 99M SESTAMIBI GENERIC - CARDIOLITE
31.0000 | Freq: Once | INTRAVENOUS | Status: AC | PRN
  Administered 2014-06-29: 31 via INTRAVENOUS

## 2014-06-29 NOTE — Procedures (Addendum)
[facility] [address] [place] [address] [phone]  Cardiology Nuclear Med Study  Brenda [name] is a 58 y.o. female     MRN : [id number]     DOB: 1955-12-04  Procedure Date: 06/29/2014  Nuclear Med Background Indication for Stress Test:  Evaluation for Ischemia History:  CAD;MI-2010;No prior NUC MPI for comparison;cath 8-9 years ago--reports 35% blockage Cardiac Risk Factors: Family History - CAD, Lipids, Obesity and Smoker  Symptoms:  Chest Pain, Dizziness, DOE, Fatigue, Near Syncope, Palpitations and SOB   Nuclear Pre-Procedure Caffeine/Decaff Intake:  1:00am NPO After: 11am   IV Site: R Forearm  IV 0.9% NS with Angio Cath:  22g  Chest Size (in):  n/a IV Started by: Rolene Course, RN  Height: 5\' 5"  (1.651 m)  Cup Size: C  BMI:  Body mass index is 29.29 kg/(m^2). Weight:  176 lb (79.833 kg)   Tech Comments:  n/a    Nuclear Med Study 1 or 2 day study: 1 day  Stress Test Type:  [place] Provider:  Lyman Bishop, MD   Resting Radionuclide: Technetium 18m Sestamibi  Resting Radionuclide Dose: 9.8 mCi   Stress Radionuclide:  Technetium 47m Sestamibi  Stress Radionuclide Dose: 31.0 mCi           Stress Protocol Rest HR: 63 Stress HR: 85  Rest BP: 157/87 Stress BP: 161/82  Exercise Time (min): n/a METS: n/a   Predicted Max HR: 162 bpm % Max HR: 54.32 bpm Rate Pressure Product: 14168  Dose of Adenosine (mg):  n/a Dose of Lexiscan: 0.4 mg  Dose of Atropine (mg): n/a Dose of Dobutamine: n/a mcg/kg/min (at max HR)  Stress Test Technologist: Leane Para, CCT Nuclear Technologist: Imagene Riches, CNMT   Rest Procedure:  Myocardial perfusion imaging was performed at rest 45 minutes following the intravenous administration of Technetium 10m Sestamibi. Stress Procedure:  The patient received IV Lexiscan 0.4 mg over 15-seconds.  Technetium 70m Sestamibi injected IV at 30-seconds.  There were no  significant changes with Lexiscan.  Quantitative spect images were obtained after a 45 minute delay.  Transient Ischemic Dilatation (Normal <1.22):  1.20  QGS EDV:  91 ml QGS ESV:  30 ml LV Ejection Fraction: 67%       Rest ECG: NSR, cannot R/O prior septal MI.  Stress ECG: No significant ST segment change suggestive of ischemia.  QPS Raw Data Images:  There is interference from nuclear activity from structures below the diaphragm. This does not affect the ability to read the study. Stress Images:  Normal homogeneous uptake in all areas of the myocardium. Rest Images:  Normal homogeneous uptake in all areas of the myocardium. Subtraction (SDS):  No evidence of ischemia.  Impression Exercise Capacity:  Lexiscan with no exercise. BP Response:  Normal blood pressure response. Clinical Symptoms:  There is chest pain and dyspnea. ECG Impression:  No significant ST segment change suggestive of ischemia. Comparison with Prior Nuclear Study: No previous nuclear study performed  Overall Impression:  Normal stress nuclear study.  LV Wall Motion:  NL LV Function; NL Wall Motion   Kirk Ruths, MD  06/29/2014 5:16 PM

## 2014-07-05 ENCOUNTER — Telehealth: Payer: Self-pay | Admitting: Family Medicine

## 2014-07-05 MED ORDER — ALBUTEROL SULFATE (2.5 MG/3ML) 0.083% IN NEBU: 2.5000 mg | INHALATION_SOLUTION | RESPIRATORY_TRACT | Status: DC | PRN

## 2014-07-05 NOTE — Telephone Encounter (Signed)
Yes, ok to fill rx for albuterol neb solution 2.5mg /33ml, one unit dose via neb machine q4h prn, 75 ml, RF x 1.-thx

## 2014-07-05 NOTE — Telephone Encounter (Signed)
Rx sent to pharmacy   

## 2014-07-05 NOTE — Telephone Encounter (Signed)
Rx request for albuterol solution 2.5 mg /3 ML.  I don't see this in patient's chart but I do know that we supplied pt's w/ nebulizer.  Please advise rx.

## 2014-07-14 ENCOUNTER — Encounter: Payer: Self-pay | Admitting: Family Medicine

## 2014-07-14 ENCOUNTER — Ambulatory Visit (INDEPENDENT_AMBULATORY_CARE_PROVIDER_SITE_OTHER): Payer: Medicare Other | Admitting: Family Medicine

## 2014-07-14 VITALS — BP 142/82 | HR 67 | Temp 98.1°F | Resp 18 | Ht 65.0 in | Wt 174.0 lb

## 2014-07-14 DIAGNOSIS — I2510 Atherosclerotic heart disease of native coronary artery without angina pectoris: Secondary | ICD-10-CM | POA: Diagnosis not present

## 2014-07-14 DIAGNOSIS — Z23 Encounter for immunization: Secondary | ICD-10-CM

## 2014-07-14 DIAGNOSIS — E782 Mixed hyperlipidemia: Secondary | ICD-10-CM

## 2014-07-14 DIAGNOSIS — R7309 Other abnormal glucose: Secondary | ICD-10-CM | POA: Diagnosis not present

## 2014-07-14 DIAGNOSIS — M549 Dorsalgia, unspecified: Secondary | ICD-10-CM

## 2014-07-14 DIAGNOSIS — J441 Chronic obstructive pulmonary disease with (acute) exacerbation: Secondary | ICD-10-CM | POA: Diagnosis not present

## 2014-07-14 DIAGNOSIS — G8929 Other chronic pain: Secondary | ICD-10-CM

## 2014-07-14 DIAGNOSIS — E8881 Metabolic syndrome: Secondary | ICD-10-CM

## 2014-07-14 DIAGNOSIS — R072 Precordial pain: Secondary | ICD-10-CM

## 2014-07-14 DIAGNOSIS — F17200 Nicotine dependence, unspecified, uncomplicated: Secondary | ICD-10-CM

## 2014-07-14 DIAGNOSIS — R7303 Prediabetes: Secondary | ICD-10-CM

## 2014-07-14 LAB — LIPID PANEL
CHOL/HDL RATIO: 7
Cholesterol: 230 mg/dL — ABNORMAL HIGH (ref 0–200)
HDL: 33.6 mg/dL — ABNORMAL LOW (ref >39.00)
NONHDL: 196.4
TRIGLYCERIDES: 224 mg/dL — AB (ref 0.0–149.0)
VLDL: 44.8 mg/dL — AB (ref 0.0–40.0)

## 2014-07-14 LAB — LDL CHOLESTEROL, DIRECT: Direct LDL: 172.2 mg/dL

## 2014-07-14 LAB — HEMOGLOBIN A1C: Hgb A1c MFr Bld: 6.1 % (ref 4.6–6.5)

## 2014-07-14 MED ORDER — LORAZEPAM 1 MG PO TABS: 1.0000 mg | ORAL_TABLET | Freq: Every day | ORAL | Status: DC | PRN

## 2014-07-14 MED ORDER — HYDROCODONE-ACETAMINOPHEN 5-325 MG PO TABS: ORAL_TABLET | ORAL | Status: DC

## 2014-07-14 MED ORDER — AZITHROMYCIN 250 MG PO TABS: ORAL_TABLET | ORAL | Status: DC

## 2014-07-14 MED ORDER — PREDNISONE 20 MG PO TABS: ORAL_TABLET | ORAL | Status: DC

## 2014-07-14 NOTE — Progress Notes (Signed)
Pre visit review using our clinic review tool, if applicable. No additional management support is needed unless otherwise documented below in the visit note. 

## 2014-07-14 NOTE — Progress Notes (Signed)
OFFICE NOTE  07/14/2014  CC:  Chief Complaint  Patient presents with  . Follow-up    fasting  . Medication Refill    nexium, ativan, hydrocodone    HPI: Patient is a 58 y.o. Caucasian female who is here for 9 mo f/u chronic LBP, HTN, tob dependence, prediabetes. ED visit for CP 05/2014.  Recent nuclear stress test was normal. Continues to have intermittent left sided chest pain, lasts 5 min to 1 hour, can come on at rest or with activity.   No nausea or SOB.  She has had cough now for about 2 wks, rattly mostly at night.  No fever.  +Wheezing and SOB at night.  Sputum yellow/thick. She has been using albut inhaler and it has helped some.  Pertinent PMH:  Past medical, surgical, social, and family history reviewed and no changes are noted since last office visit.  MEDS:  ASA 81mg  qd Outpatient Prescriptions Prior to Visit  Medication Sig Dispense Refill  . cyclobenzaprine (FLEXERIL) 10 MG tablet take 1 tablet by mouth every 8 hours if needed  30 tablet  5  . esomeprazole (NEXIUM) 40 MG capsule Take 1 capsule (40 mg total) by mouth daily.  30 capsule  10  . fenofibrate 160 MG tablet take 1 tablet by mouth once daily with food  30 tablet  3  . HYDROcodone-acetaminophen (NORCO/VICODIN) 5-325 MG per tablet take 1 tablet by mouth every 6 hours if needed for pain  90 tablet  0  . LORazepam (ATIVAN) 1 MG tablet Take 1-2 mg by mouth daily as needed for anxiety.      . rosuvastatin (CRESTOR) 10 MG tablet take 1 tablet by mouth once daily  30 tablet  5  . albuterol (PROVENTIL) (2.5 MG/3ML) 0.083% nebulizer solution Take 3 mLs (2.5 mg total) by nebulization every 4 (four) hours as needed for wheezing or shortness of breath.  75 mL  1   No facility-administered medications prior to visit.    PE: Blood pressure 142/82, pulse 67, temperature 98.1 F (36.7 C), temperature source Temporal, resp. rate 18, height 5\' 5"  (1.651 m), weight 174 lb (78.926 kg), SpO2 95.00%. Gen: Alert, well appearing.   Patient is oriented to person, place, time, and situation. CV: RRR, no m/r/g.   LUNGS: CTA bilat, nonlabored resps, good aeration in all lung fields.  LABS: none today Recent:   Chemistry      Component Value Date/Time   NA 143 05/08/2014 1551   K 3.9 05/08/2014 1551   CL 106 05/08/2014 1551   CO2 24 05/08/2014 1551   BUN 16 05/08/2014 1551   CREATININE 0.60 05/08/2014 1551   CREATININE 0.69 06/29/2011 1607      Component Value Date/Time   CALCIUM 9.6 05/08/2014 1551   ALKPHOS 49 05/08/2014 1551   AST 17 05/08/2014 1551   ALT 14 05/08/2014 1551   BILITOT 0.2* 05/08/2014 1551      IMPRESSION AND PLAN:  1) Metabolic syndrome: Recheck HbA1c and FLP today. BP stable on no antihypertensives.  Recent lytes/cr in ED 05/2014 normal.  2) COPD exacerbation. Prednisone 40mg  qd x 5d. Z-pack. Robitussin DM. Encouraged smoking cessation.  3) Precordial CP; ED eval reviewed in full today (TnI neg x 2, no EKG abnormalities, CT angio chest showed no PE): recent stress test was normal.  I encouraged her to take her nexium daily in case she is having some esophagitis pain (she admits to taking this med only prn).  Keep scheduled f/u appt  with Dr. Debara Pickett, cardiologist.  4) Chronic LBP: stable. I printed rx's for Vicodin 5/325, 1 q6h prn, #90, today for this month, October, and November 2015.  Appropriate fill on/after date was noted on each rx.   FOLLOW UP: 4 mo

## 2014-07-15 ENCOUNTER — Other Ambulatory Visit: Payer: Self-pay

## 2014-07-16 ENCOUNTER — Other Ambulatory Visit: Payer: Self-pay

## 2014-07-16 MED ORDER — ESOMEPRAZOLE MAGNESIUM 40 MG PO CPDR: 40.0000 mg | DELAYED_RELEASE_CAPSULE | Freq: Every day | ORAL | Status: DC

## 2014-07-20 ENCOUNTER — Ambulatory Visit (INDEPENDENT_AMBULATORY_CARE_PROVIDER_SITE_OTHER): Payer: Medicare Other | Admitting: Internal Medicine

## 2014-07-20 ENCOUNTER — Encounter: Payer: Self-pay | Admitting: Internal Medicine

## 2014-07-20 VITALS — BP 127/78 | HR 100 | Ht 65.0 in | Wt 175.6 lb

## 2014-07-20 DIAGNOSIS — F411 Generalized anxiety disorder: Secondary | ICD-10-CM

## 2014-07-20 DIAGNOSIS — F17200 Nicotine dependence, unspecified, uncomplicated: Secondary | ICD-10-CM

## 2014-07-20 DIAGNOSIS — I2510 Atherosclerotic heart disease of native coronary artery without angina pectoris: Secondary | ICD-10-CM

## 2014-07-20 DIAGNOSIS — R0789 Other chest pain: Secondary | ICD-10-CM

## 2014-07-20 MED ORDER — ATENOLOL 25 MG PO TABS: 25.0000 mg | ORAL_TABLET | Freq: Every day | ORAL | Status: DC

## 2014-07-20 MED ORDER — ROSUVASTATIN CALCIUM 20 MG PO TABS: 20.0000 mg | ORAL_TABLET | Freq: Every day | ORAL | Status: DC

## 2014-07-20 NOTE — Addendum Note (Signed)
Addended by: Ralph Dowdy on: 07/20/2014 03:24 PM   Modules accepted: Orders

## 2014-07-20 NOTE — Progress Notes (Signed)
OFFICE NOTE  Chief Complaint:  Chest pain  Primary Care Physician: Tammi Sou, MD  HPI:  Brenda [name] is a pleasant 58 year old female, referred to me for evaluation of chest pain. She had a previous evaluation of chest pain by Dr. Leslye Peer about 10 years ago including a cardiac catheterization that showed a moderate stenosis and one-vessel. No stents were placed at that time. There is a family history of heart disease and her father had a heart attack in his 90s. Briefly she been having some chest pain and went to the emergency department at the end of June in any pain. She was out for MI and was sent home. She's also had progressive chest tightness with minimal exertion. The other night she had chest pain which woke her up and this is somewhat worrisome for her. Just he has dyslipidemia but is well treated on Crestor and fenofibrate. She does have an allergy to aspirin and is not currently on any antiplatelet therapy.  Brenda [name] returns today for followup. Her nuclear stress test was negative for ischemia. If she continues to have chest pain at rest and sometimes with exertion. There is no particular pattern to her symptoms. There has been no increase or decrease in the consistency.  PMHx:  Past Medical History  Diagnosis Date  . Chronic LBP   . DDD (degenerative disc disease)     spinal stenosis  . Hyperlipidemia     mixed  . History of GI bleed     NSAIDS  . Recurrent UTI   . Microscopic hematuria     full w/ u unrevealing X 2  . Tobacco dependence     w/ only occasional bronchitis illness (CT shest 2009 nl- for f/u of ? nodule on CXR)  . Atypical chest pain 2007    cardiolyte neg, echo nl, cath showed mild/nonobstructive LAD disease  . Normal nuclear stress test 11/11    negative, EF normal preop.  Plan for repeat nuclear stress test for chest pain as of 06/28/14 as per Dr. Debara Pickett.  . Palpitations 2006    48H holter neg  . Osteoarthritis of knee 06/30/2012      Right   . COPD (chronic obstructive pulmonary disease)     Past Surgical History  Procedure Laterality Date  . Lumbar spine surgery  1993    right iliac crest bone graft+metal instrumentation; 2005 metal removal and decompression, 2011 lumbar decompression 4-11, then stabilization/ instrumentation done 09-19-10: L2,L3, L5 left and L2 , L3, L4 right pedical remnant L4 left embedded. Left iliac crest bone graft-- Dr Velna Ochs  . Appendectomy  1984  . Abdominal hysterectomy  1997    DUB  . Right oopherectomy      cyst  . Tonsillectomy  58 yrs old  . Knee arthroscopy      right and left  . Left wrist ganglion cyst excision  2010  . Tonsillectomy    . Cardiac catheterization  2000s  . Cardiovascular stress test  06/2014    Nuclear stress test NORMAL    FAMHx:  Family History  Problem Relation Age of Onset  . Coronary artery disease Neg Hx   . Heart Problems Mother     and thyroid problems  . Hyperlipidemia Mother   . Diabetes Mother   . Breast cancer Mother   . Heart failure Father     CHF, heart attack  . Heart disease Brother     back problems  . Hypertension  Brother   . Cancer Paternal Grandmother     mouth - snuff  . Heart attack Paternal Grandfather     stroke, HTN  . Hyperlipidemia Brother   . Heart attack Brother   . Cancer Brother   . Stroke Maternal Grandfather   . Kidney failure Maternal Grandmother     SOCHx:   reports that she has been smoking Cigarettes.  She has a 35 pack-year smoking history. She has never used smokeless tobacco. She reports that she does not drink alcohol or use illicit drugs.  ALLERGIES:  Allergies  Allergen Reactions  . Aspirin Other (See Comments)    REACTION: GI Bleed  . Beta Adrenergic Blockers Other (See Comments)    REACTION: decreased libido  . Nsaids Other (See Comments)    REACTION: GI Upset  . Penicillins Swelling  . Prochlorperazine Edisylate Other (See Comments)    REACTION: Stroke like symptoms  . Sulfonamide  Derivatives Other (See Comments)    REACTION: unsure rxn  . Amitriptyline Hcl Palpitations    REACTION: increased heart rate    ROS: A comprehensive review of systems was negative except for: Cardiovascular: positive for chest pain  HOME MEDS: Current Outpatient Prescriptions  Medication Sig Dispense Refill  . albuterol (PROVENTIL) (2.5 MG/3ML) 0.083% nebulizer solution Take 3 mLs (2.5 mg total) by nebulization every 4 (four) hours as needed for wheezing or shortness of breath.  75 mL  1  . azithromycin (ZITHROMAX) 250 MG tablet 2 tabs po qd x 1d, then 1 tab po qd x 4d  6 tablet  0  . cyclobenzaprine (FLEXERIL) 10 MG tablet take 1 tablet by mouth every 8 hours if needed  30 tablet  5  . esomeprazole (NEXIUM) 40 MG capsule Take 1 capsule (40 mg total) by mouth daily.  30 capsule  4  . fenofibrate 160 MG tablet take 1 tablet by mouth once daily with food  30 tablet  3  . HYDROcodone-acetaminophen (NORCO/VICODIN) 5-325 MG per tablet take 1 tablet by mouth every 6 hours if needed for pain  90 tablet  0  . LORazepam (ATIVAN) 1 MG tablet Take 1-2 tablets (1-2 mg total) by mouth daily as needed for anxiety.  30 tablet  5  . predniSONE (DELTASONE) 20 MG tablet 2 tabs po qd x 5d  10 tablet  0  . rosuvastatin (CRESTOR) 10 MG tablet take 1 tablet by mouth once daily  30 tablet  5  . atenolol (TENORMIN) 25 MG tablet Take 1 tablet (25 mg total) by mouth daily.  30 tablet  6   No current facility-administered medications for this visit.    LABS/IMAGING: No results found for this or any previous visit (from the past 48 hour(s)). No results found.  VITALS: BP 127/78  Pulse 100  Ht 5\' 5"  (1.651 m)  Wt 175 lb 9.6 oz (79.652 kg)  BMI 29.22 kg/m2  EXAM: deferred  EKG: deferred  ASSESSMENT: 1. Chest pain - negative nuclear stress test 2. History of moderate coronary artery disease 3. Dyslipidemia 4. Family history of premature coronary disease  PLAN: 28.   Brenda [name] had a negative  nuclear stress test for ischemia with a preserved EF. I suspect her chest pain is noncardiac however she could have some small vessel coronary disease. She was previously on a beta blocker for history of palpitations which she says has resolved. She is intolerant to beta blockers due to 2 problems with decreased libido. She is however willing to try  taking a low-dose beta blocker again I would like to restart atenolol 25 mg daily. This should help with her tachycardia as well as perhaps some small vessel ischemia if this is the cause of her ongoing chest pain. The negative nuclear stress test be low risk is also reassuring.  Thank you again for the kind referral. I will see her back in one month to see if her symptoms have improved on the beta blocker.  Pixie Casino, MD, [facility] Attending Cardiologist [facility]  HILTY,Kenneth C 07/20/2014, 1:10 PM

## 2014-07-20 NOTE — Patient Instructions (Signed)
Your physician recommends that you schedule a follow-up appointment in: 1 month with Dr. Debara Pickett.   Your physician has recommended you make the following change in your medication: START atenolol 25mg  once daily.

## 2014-07-21 NOTE — Progress Notes (Signed)
Noted  

## 2014-07-27 ENCOUNTER — Encounter: Payer: Self-pay | Admitting: Family Medicine

## 2014-08-14 DIAGNOSIS — J029 Acute pharyngitis, unspecified: Secondary | ICD-10-CM | POA: Diagnosis not present

## 2014-08-14 DIAGNOSIS — J019 Acute sinusitis, unspecified: Secondary | ICD-10-CM | POA: Diagnosis not present

## 2014-08-16 ENCOUNTER — Encounter: Payer: Self-pay | Admitting: *Deleted

## 2014-08-19 ENCOUNTER — Ambulatory Visit (INDEPENDENT_AMBULATORY_CARE_PROVIDER_SITE_OTHER): Payer: Medicare Other | Admitting: Internal Medicine

## 2014-08-19 ENCOUNTER — Encounter: Payer: Self-pay | Admitting: Internal Medicine

## 2014-08-19 VITALS — BP 132/76 | HR 66 | Ht 65.0 in | Wt 176.5 lb

## 2014-08-19 DIAGNOSIS — E785 Hyperlipidemia, unspecified: Secondary | ICD-10-CM | POA: Diagnosis not present

## 2014-08-19 DIAGNOSIS — I25118 Atherosclerotic heart disease of native coronary artery with other forms of angina pectoris: Secondary | ICD-10-CM

## 2014-08-19 DIAGNOSIS — F411 Generalized anxiety disorder: Secondary | ICD-10-CM

## 2014-08-19 DIAGNOSIS — I2510 Atherosclerotic heart disease of native coronary artery without angina pectoris: Secondary | ICD-10-CM

## 2014-08-19 DIAGNOSIS — I209 Angina pectoris, unspecified: Secondary | ICD-10-CM

## 2014-08-19 MED ORDER — NITROGLYCERIN 0.4 MG SL SUBL: 0.4000 mg | SUBLINGUAL_TABLET | SUBLINGUAL | Status: DC | PRN

## 2014-08-19 NOTE — Patient Instructions (Addendum)
Your physician wants you to follow-up in: 6 months with Dr. Debara Pickett. You will receive a reminder letter in the mail two months in advance. If you don't receive a letter, please call our office to schedule the follow-up appointment.  Please use nitroglycerin as needed for chest pain - 1 tablet dissolved under tongue every 5 minutes, MAX 3 doses.  Please contact our office should you take nitroglycerin and it provide relief.

## 2014-08-19 NOTE — Progress Notes (Signed)
OFFICE NOTE  Chief Complaint:  Chest pain  Primary Care Physician: Tammi Sou, MD  HPI:  Brenda [name] is a pleasant 58 year old female, referred to me for evaluation of chest pain. She had a previous evaluation of chest pain by Dr. Leslye Peer about 10 years ago including a cardiac catheterization that showed a moderate stenosis and one-vessel. No stents were placed at that time. There is a family history of heart disease and her father had a heart attack in his 42s. Briefly she been having some chest pain and went to the emergency department at the end of June in any pain. She was out for MI and was sent home. She's also had progressive chest tightness with minimal exertion. The other night she had chest pain which woke her up and this is somewhat worrisome for her. Just he has dyslipidemia but is well treated on Crestor and fenofibrate. She does have an allergy to aspirin and is not currently on any antiplatelet therapy.  Mrs. Cardona was recently seen in follow-up. Her nuclear stress test was negative for ischemia. She continues to report short episodes of ongoing chest pain which are sometimes related to exertion but other times occur at rest. I elected to start low-dose beta blocker to see if it improved her symptoms and she reports very little difference. She did stop smoking about 4 weeks ago and so far is doing well. I commended her on that. Her symptoms certainly could be do to small vessel ischemia and one must consider this possibility.  PMHx:  Past Medical History  Diagnosis Date  . Chronic LBP   . DDD (degenerative disc disease)     spinal stenosis  . Hyperlipidemia     mixed  . History of GI bleed     NSAIDS  . Recurrent UTI   . Microscopic hematuria     full w/ u unrevealing X 2  . Tobacco dependence     w/ only occasional bronchitis illness (CT shest 2009 nl- for f/u of ? nodule on CXR)  . Atypical chest pain 2007; 2015    cardiolyte neg, echo nl, cath  showed mild/nonobstructive LAD disease  . Normal nuclear stress test 11/11 and 06/2014    negative, EF normal  . Palpitations 2006    48H holter neg  . Osteoarthritis of knee 06/30/2012    Right   . COPD (chronic obstructive pulmonary disease)     Past Surgical History  Procedure Laterality Date  . Lumbar spine surgery  1993    right iliac crest bone graft+metal instrumentation; 2005 metal removal and decompression, 2011 lumbar decompression 4-11, then stabilization/ instrumentation done 09-19-10: L2,L3, L5 left and L2 , L3, L4 right pedical remnant L4 left embedded. Left iliac crest bone graft-- Dr Velna Ochs  . Appendectomy  1984  . Abdominal hysterectomy  1997    DUB  . Oophorectomy Right     cyst  . Tonsillectomy  58 yrs old  . Knee arthroscopy Bilateral   . Left wrist ganglion cyst excision  2010  . Tonsillectomy    . Cardiac catheterization  10/09/2005    no CAD, mildly elevated LVEDP, normal LV function (Dr. Gerrie Nordmann)  . Cardiovascular stress test  06/2014    normal lexiscan NST  . Transthoracic echocardiogram  2006    EF=>55%, trace MR, mild TR, trace AV regurg, trace pulm valve regurg   . Cardiovascular stress test  2006    persantine - no ischemia, low risk  FAMHx:  Family History  Problem Relation Age of Onset  . Coronary artery disease Neg Hx   . Heart Problems Mother     and thyroid problems  . Hyperlipidemia Mother   . Diabetes Mother   . Breast cancer Mother   . Heart failure Father     CHF, heart attack, diabetes, hyperlipidemia  . Heart disease Brother     back problems  . Hypertension Brother   . Cancer Paternal Grandmother     mouth - snuff  . Heart attack Paternal Grandfather     stroke, HTN  . Hyperlipidemia Brother   . Heart attack Brother   . Cancer Brother   . Stroke Maternal Grandfather   . Kidney failure Maternal Grandmother     SOCHx:   reports that she quit smoking about 4 weeks ago. Her smoking use included Cigarettes. She has a 35  pack-year smoking history. She has never used smokeless tobacco. She reports that she does not drink alcohol or use illicit drugs.  ALLERGIES:  Allergies  Allergen Reactions  . Aspirin Other (See Comments)    REACTION: GI Bleed  . Beta Adrenergic Blockers Other (See Comments)    REACTION: decreased libido  . Nsaids Other (See Comments)    REACTION: GI Upset  . Penicillins Swelling  . Prochlorperazine Edisylate Other (See Comments)    REACTION: Stroke like symptoms  . Sulfonamide Derivatives Other (See Comments)    REACTION: unsure rxn  . Amitriptyline Hcl Palpitations    REACTION: increased heart rate    ROS: A comprehensive review of systems was negative except for: Cardiovascular: positive for chest pain  HOME MEDS: Current Outpatient Prescriptions  Medication Sig Dispense Refill  . albuterol (PROVENTIL) (2.5 MG/3ML) 0.083% nebulizer solution Take 3 mLs (2.5 mg total) by nebulization every 4 (four) hours as needed for wheezing or shortness of breath.  75 mL  1  . atenolol (TENORMIN) 25 MG tablet Take 1 tablet (25 mg total) by mouth daily.  30 tablet  6  . cyclobenzaprine (FLEXERIL) 10 MG tablet take 1 tablet by mouth every 8 hours if needed  30 tablet  5  . esomeprazole (NEXIUM) 40 MG capsule Take 1 capsule (40 mg total) by mouth daily.  30 capsule  4  . fenofibrate 160 MG tablet take 1 tablet by mouth once daily with food  30 tablet  3  . HYDROcodone-acetaminophen (NORCO/VICODIN) 5-325 MG per tablet take 1 tablet by mouth every 6 hours if needed for pain  90 tablet  0  . LORazepam (ATIVAN) 1 MG tablet Take 1-2 tablets (1-2 mg total) by mouth daily as needed for anxiety.  30 tablet  5  . rosuvastatin (CRESTOR) 20 MG tablet Take 1 tablet (20 mg total) by mouth daily.  30 tablet  6  . nitroGLYCERIN (NITROSTAT) 0.4 MG SL tablet Place 1 tablet (0.4 mg total) under the tongue every 5 (five) minutes as needed for chest pain (MAX 3 doses.).  25 tablet  3   No current  facility-administered medications for this visit.    LABS/IMAGING: No results found for this or any previous visit (from the past 48 hour(s)). No results found.  VITALS: BP 132/76  Pulse 66  Ht 5\' 5"  (1.651 m)  Wt 176 lb 8 oz (80.06 kg)  BMI 29.37 kg/m2  EXAM: deferred  EKG: deferred  ASSESSMENT: 1. Chest pain - negative nuclear stress test 2. History of moderate coronary artery disease 3. Dyslipidemia 4. Family history of  premature coronary disease  PLAN: 1.   Mrs. Vespa has had little improvement in her symptoms on the beta blocker however does not feel any worse. Blood pressure is well-controlled today. We will continue her atenolol. I've given her sublingual nitroglycerin to use as needed if she has more episodes of chest pain. If she notices any clear pattern of relief with nitrates, she may need a long-acting nitrate or perhaps an invasive coronary evaluation.    Plan to see her back in 6 months.  Pixie Casino, MD, [facility] Attending Cardiologist [facility]  Efe Fazzino C 08/19/2014, 2:26 PM

## 2014-10-06 DIAGNOSIS — J019 Acute sinusitis, unspecified: Secondary | ICD-10-CM | POA: Diagnosis not present

## 2014-10-06 DIAGNOSIS — J029 Acute pharyngitis, unspecified: Secondary | ICD-10-CM | POA: Diagnosis not present

## 2014-10-06 DIAGNOSIS — J209 Acute bronchitis, unspecified: Secondary | ICD-10-CM | POA: Diagnosis not present

## 2014-10-14 ENCOUNTER — Telehealth: Payer: Self-pay | Admitting: Family Medicine

## 2014-10-14 NOTE — Telephone Encounter (Signed)
Patient is due for OV.  She will need to come in for that before getting any further refills.

## 2014-10-14 NOTE — Telephone Encounter (Signed)
Brenda [name] called and needs a refill on her pain meds. Her husband will be in  [place] tomorrow so he can p/u RX then.

## 2014-10-14 NOTE — Telephone Encounter (Signed)
Rx refill from [facility].  Pt needs refills on her flexeril.  Patient last OV 07/14/14.  Last RX was 05/11/14.  Please advise.  Pt isn't due for rf until January so no rush.  [facility] sends as soon as there are no refills left.

## 2014-10-18 ENCOUNTER — Other Ambulatory Visit: Payer: Self-pay | Admitting: Family Medicine

## 2014-10-18 DIAGNOSIS — M549 Dorsalgia, unspecified: Principal | ICD-10-CM

## 2014-10-18 DIAGNOSIS — G8929 Other chronic pain: Secondary | ICD-10-CM

## 2014-10-18 MED ORDER — CYCLOBENZAPRINE HCL 10 MG PO TABS: ORAL_TABLET | ORAL | Status: DC

## 2014-10-18 MED ORDER — HYDROCODONE-ACETAMINOPHEN 5-325 MG PO TABS: ORAL_TABLET | ORAL | Status: DC

## 2014-10-18 NOTE — Telephone Encounter (Signed)
Per Dr. Anitra Lauth,  Okay to give patient 30 day supply with no refills.  Patient needs OV for further refills.

## 2014-10-18 NOTE — Telephone Encounter (Signed)
Rx sent to pharmacy with fill on/after date of 11/01/14.

## 2014-10-20 ENCOUNTER — Encounter (HOSPITAL_COMMUNITY): Payer: Self-pay | Admitting: Emergency Medicine

## 2014-10-20 ENCOUNTER — Emergency Department (HOSPITAL_COMMUNITY)
Admission: EM | Admit: 2014-10-20 | Discharge: 2014-10-21 | Disposition: A | Discharge status: Home / Self Care | Payer: Medicare Other | Attending: Emergency Medicine | Admitting: Emergency Medicine

## 2014-10-20 ENCOUNTER — Other Ambulatory Visit: Payer: Self-pay | Admitting: Family Medicine

## 2014-10-20 ENCOUNTER — Emergency Department (HOSPITAL_COMMUNITY): Payer: Medicare Other

## 2014-10-20 DIAGNOSIS — R079 Chest pain, unspecified: Secondary | ICD-10-CM | POA: Diagnosis not present

## 2014-10-20 DIAGNOSIS — R1110 Vomiting, unspecified: Secondary | ICD-10-CM | POA: Diagnosis not present

## 2014-10-20 DIAGNOSIS — Z8719 Personal history of other diseases of the digestive system: Secondary | ICD-10-CM | POA: Insufficient documentation

## 2014-10-20 DIAGNOSIS — E785 Hyperlipidemia, unspecified: Secondary | ICD-10-CM | POA: Diagnosis not present

## 2014-10-20 DIAGNOSIS — Z9889 Other specified postprocedural states: Secondary | ICD-10-CM | POA: Insufficient documentation

## 2014-10-20 DIAGNOSIS — Z79899 Other long term (current) drug therapy: Secondary | ICD-10-CM | POA: Insufficient documentation

## 2014-10-20 DIAGNOSIS — Z87891 Personal history of nicotine dependence: Secondary | ICD-10-CM | POA: Diagnosis not present

## 2014-10-20 DIAGNOSIS — G8929 Other chronic pain: Secondary | ICD-10-CM | POA: Diagnosis not present

## 2014-10-20 DIAGNOSIS — Z87440 Personal history of urinary (tract) infections: Secondary | ICD-10-CM | POA: Insufficient documentation

## 2014-10-20 DIAGNOSIS — M179 Osteoarthritis of knee, unspecified: Secondary | ICD-10-CM | POA: Insufficient documentation

## 2014-10-20 DIAGNOSIS — Z880 Allergy status to penicillin: Secondary | ICD-10-CM | POA: Diagnosis not present

## 2014-10-20 DIAGNOSIS — J449 Chronic obstructive pulmonary disease, unspecified: Secondary | ICD-10-CM | POA: Insufficient documentation

## 2014-10-20 LAB — CBC
HCT: 37.9 % (ref 36.0–46.0)
Hemoglobin: 12.6 g/dL (ref 12.0–15.0)
MCH: 31 pg (ref 26.0–34.0)
MCHC: 33.2 g/dL (ref 30.0–36.0)
MCV: 93.1 fL (ref 78.0–100.0)
Platelets: 251 10*3/uL (ref 150–400)
RBC: 4.07 MIL/uL (ref 3.87–5.11)
RDW: 13.5 % (ref 11.5–15.5)
WBC: 9.9 10*3/uL (ref 4.0–10.5)

## 2014-10-20 LAB — BASIC METABOLIC PANEL
Anion gap: 12 (ref 5–15)
BUN: 16 mg/dL (ref 6–23)
CO2: 26 mEq/L (ref 19–32)
Calcium: 9.3 mg/dL (ref 8.4–10.5)
Chloride: 106 mEq/L (ref 96–112)
Creatinine, Ser: 0.64 mg/dL (ref 0.50–1.10)
GFR calc Af Amer: >90 mL/min (ref >90)
GFR calc non Af Amer: >90 mL/min (ref >90)
Glucose, Bld: 113 mg/dL — ABNORMAL HIGH (ref 70–99)
Potassium: 4.1 mEq/L (ref 3.7–5.3)
Sodium: 144 mEq/L (ref 137–147)

## 2014-10-20 LAB — I-STAT TROPONIN, ED: Troponin i, poc: 0.01 ng/mL (ref 0.00–0.08)

## 2014-10-20 MED ORDER — FENOFIBRATE 160 MG PO TABS: ORAL_TABLET | ORAL | Status: DC

## 2014-10-20 MED ORDER — OXYCODONE-ACETAMINOPHEN 5-325 MG PO TABS
2.0000 | ORAL_TABLET | Freq: Once | ORAL | Status: AC
  Administered 2014-10-21: 2 via ORAL
  Filled 2014-10-20: qty 2

## 2014-10-20 NOTE — ED Notes (Signed)
Pt. reports left chest pain while at church this evening described as " burning/heavy " with nausea , denies SOB or diaphoresis , pt. took 2 NTG sl with mild relief.

## 2014-10-20 NOTE — ED Provider Notes (Signed)
CSN: [id number]     Arrival date & time 10/20/14  2222 History  This chart was scribed for Virgel Manifold, MD by Evelene Croon, ED Scribe. This patient was seen in room B18C/B18C and the patient's care was started 11:07 PM.    Chief Complaint  Patient presents with  . Chest Pain    The history is provided by the patient. No language interpreter was used.    HPI Comments:  Brenda [name] is a 58 y.o. female who presents to the Emergency Department complaining of left sided non-radiating sharp burning CP that started while at church about 2000 tonight. Pt states she has taken 2 NTG since symptom onset. She states the first provided mild relief, while the second did not further improve her pain. She reports a h/o same pain for months.  Her last cardiac catherization was about 9 years ago; showed 30% blockage and her last stress test was about 4 months ago. She reports associated tingling in her left hand; notes she occasionally feels same tingling with past episodes of CP. She denies fever, cough, chills, SOB, diaphoresis and BLE edema. No ASA taken PTA due to h/o GI bleed.   Past Medical History  Diagnosis Date  . Chronic LBP   . DDD (degenerative disc disease)     spinal stenosis  . Hyperlipidemia     mixed  . History of GI bleed     NSAIDS  . Recurrent UTI   . Microscopic hematuria     full w/ u unrevealing X 2  . Tobacco dependence     w/ only occasional bronchitis illness (CT shest 2009 nl- for f/u of ? nodule on CXR)  . Atypical chest pain 2007; 2015    cardiolyte neg, echo nl, cath showed mild/nonobstructive LAD disease  . Normal nuclear stress test 11/11 and 06/2014    negative, EF normal  . Palpitations 2006    48H holter neg  . Osteoarthritis of knee 06/30/2012    Right   . COPD (chronic obstructive pulmonary disease)    Past Surgical History  Procedure Laterality Date  . Lumbar spine surgery  1993    right iliac crest bone graft+metal instrumentation; 2005 metal  removal and decompression, 2011 lumbar decompression 4-11, then stabilization/ instrumentation done 09-19-10: L2,L3, L5 left and L2 , L3, L4 right pedical remnant L4 left embedded. Left iliac crest bone graft-- Dr Velna Ochs  . Appendectomy  1984  . Abdominal hysterectomy  1997    DUB  . Oophorectomy Right     cyst  . Tonsillectomy  58 yrs old  . Knee arthroscopy Bilateral   . Left wrist ganglion cyst excision  2010  . Tonsillectomy    . Cardiac catheterization  10/09/2005    no CAD, mildly elevated LVEDP, normal LV function (Dr. Gerrie Nordmann)  . Cardiovascular stress test  06/2014    normal lexiscan NST  . Transthoracic echocardiogram  2006    EF=>55%, trace MR, mild TR, trace AV regurg, trace pulm valve regurg   . Cardiovascular stress test  2006    persantine - no ischemia, low risk    Family History  Problem Relation Age of Onset  . Coronary artery disease Neg Hx   . Heart Problems Mother     and thyroid problems  . Hyperlipidemia Mother   . Diabetes Mother   . Breast cancer Mother   . Heart failure Father     CHF, heart attack, diabetes, hyperlipidemia  . Heart disease  Brother     back problems  . Hypertension Brother   . Cancer Paternal Grandmother     mouth - snuff  . Heart attack Paternal Grandfather     stroke, HTN  . Hyperlipidemia Brother   . Heart attack Brother   . Cancer Brother   . Stroke Maternal Grandfather   . Kidney failure Maternal Grandmother    History  Substance Use Topics  . Smoking status: Former Smoker -- 1.00 packs/day for 35 years    Types: Cigarettes    Quit date: 07/22/2014  . Smokeless tobacco: Never Used     Comment: down to ~2 cigarettes/daily (06/23/14) - Quit Smoking around 07/20/14!  Marland Kitchen Alcohol Use: No   OB History    No data available     Review of Systems  Constitutional: Negative for fever, chills and diaphoresis.  Respiratory: Negative for cough and shortness of breath.   Cardiovascular: Positive for chest pain. Negative for leg  swelling.  Neurological:       Paresthesia  All other systems reviewed and are negative.     Allergies  Aspirin; Beta adrenergic blockers; Nsaids; Penicillins; Prochlorperazine edisylate; Sulfonamide derivatives; and Amitriptyline hcl  Home Medications   Prior to Admission medications   Medication Sig Start Date End Date Taking? Authorizing Provider  albuterol (PROVENTIL) (2.5 MG/3ML) 0.083% nebulizer solution Take 3 mLs (2.5 mg total) by nebulization every 4 (four) hours as needed for wheezing or shortness of breath. 07/05/14   Tammi Sou, MD  atenolol (TENORMIN) 25 MG tablet Take 1 tablet (25 mg total) by mouth daily. 07/20/14   Pixie Casino, MD  cyclobenzaprine (FLEXERIL) 10 MG tablet take 1 tablet by mouth every 8 hours if needed 10/18/14   Tammi Sou, MD  esomeprazole (NEXIUM) 40 MG capsule Take 1 capsule (40 mg total) by mouth daily. 07/16/14   Tammi Sou, MD  fenofibrate 160 MG tablet take 1 tablet by mouth once daily with food 10/20/14   Tammi Sou, MD  HYDROcodone-acetaminophen (NORCO/VICODIN) 5-325 MG per tablet take 1 tablet by mouth every 6 hours if needed for pain 10/18/14   Tammi Sou, MD  LORazepam (ATIVAN) 1 MG tablet Take 1-2 tablets (1-2 mg total) by mouth daily as needed for anxiety. 07/14/14   Tammi Sou, MD  nitroGLYCERIN (NITROSTAT) 0.4 MG SL tablet Place 1 tablet (0.4 mg total) under the tongue every 5 (five) minutes as needed for chest pain (MAX 3 doses.). 08/19/14   Pixie Casino, MD  rosuvastatin (CRESTOR) 20 MG tablet Take 1 tablet (20 mg total) by mouth daily. 07/20/14   Tammi Sou, MD   BP 137/68 mmHg  Pulse 80  Temp(Src) 98 F (36.7 C) (Oral)  Resp 24  Ht 5\' 4"  (1.626 m)  Wt 176 lb (79.833 kg)  BMI 30.20 kg/m2  SpO2 93% Physical Exam  Constitutional: She appears well-developed and well-nourished. No distress.  HENT:  Head: Normocephalic and atraumatic.  Eyes: Conjunctivae are normal. Right eye exhibits no  discharge. Left eye exhibits no discharge.  Neck: Neck supple.  Cardiovascular: Normal rate, regular rhythm and normal heart sounds.  Exam reveals no gallop and no friction rub.   No murmur heard. Pulmonary/Chest: Effort normal and breath sounds normal. No respiratory distress.  Abdominal: Soft. She exhibits no distension. There is no tenderness.  Musculoskeletal: She exhibits no edema or tenderness.  Lower extremities symmetric as compared to each other. No calf tenderness. Negative Homan's. No palpable cords.  Neurological: She is alert.  Skin: Skin is warm and dry.  Psychiatric: She has a normal mood and affect. Her behavior is normal. Thought content normal.  Nursing note and vitals reviewed.   ED Course  Procedures   DIAGNOSTIC STUDIES:  Oxygen Saturation is 93% on RA, low by my interpretation.    COORDINATION OF CARE:  11:17 PM Discussed treatment plan with pt at bedside and pt agreed to plan.  Labs Review Labs Reviewed  BASIC METABOLIC PANEL - Abnormal; Notable for the following:    Glucose, Bld 113 (*)    All other components within normal limits  CBC  TROPONIN I  I-STAT TROPOININ, ED    Imaging Review Dg Chest 2 View  10/20/2014   CLINICAL DATA:  Chest pain worsening tonight, vomiting  EXAM: CHEST  2 VIEW  COMPARISON:  05/08/2014  FINDINGS: Cardiomediastinal silhouette is stable. No acute infiltrate or pleural effusion. No pulmonary edema. Stable probable chronic mild interstitial prominence.  IMPRESSION: No active cardiopulmonary disease.   Electronically Signed   By: Lahoma Crocker M.D.   On: 10/20/2014 22:55     EKG Interpretation   Date/Time:  Wednesday October 20 2014 22:26:33 EST Ventricular Rate:  75 PR Interval:  160 QRS Duration: 80 QT Interval:  366 QTC Calculation: 408 R Axis:   75 Text Interpretation:  Sinus rhythm with occasional Premature ventricular  complexes Low voltage QRS Nonspecific T wave abnormality Abnormal ECG  Confirmed by BEATON   MD, ROBERT ([id number]) on 10/20/2014 10:32:48 PM      MDM   Final diagnoses:  Chest pain   I personally preformed the services scribed in my presence. The recorded information has been reviewed is accurate. Virgel Manifold, MD.    Virgel Manifold, MD 11/01/14 1556

## 2014-10-20 NOTE — ED Notes (Signed)
Patient transported to X-ray 

## 2014-10-21 LAB — TROPONIN I: Troponin I: <0.3 ng/mL (ref <0.30)

## 2014-10-21 NOTE — Discharge Instructions (Signed)

## 2014-11-11 ENCOUNTER — Ambulatory Visit (INDEPENDENT_AMBULATORY_CARE_PROVIDER_SITE_OTHER): Payer: Medicare Other | Admitting: Family Medicine

## 2014-11-11 ENCOUNTER — Encounter: Payer: Self-pay | Admitting: Family Medicine

## 2014-11-11 VITALS — BP 123/76 | HR 74 | Temp 98.1°F | Resp 16 | Ht 65.0 in | Wt 182.0 lb

## 2014-11-11 DIAGNOSIS — I10 Essential (primary) hypertension: Secondary | ICD-10-CM

## 2014-11-11 DIAGNOSIS — R7303 Prediabetes: Secondary | ICD-10-CM

## 2014-11-11 DIAGNOSIS — R7309 Other abnormal glucose: Secondary | ICD-10-CM

## 2014-11-11 DIAGNOSIS — R3915 Urgency of urination: Secondary | ICD-10-CM | POA: Diagnosis not present

## 2014-11-11 DIAGNOSIS — G8929 Other chronic pain: Secondary | ICD-10-CM

## 2014-11-11 DIAGNOSIS — R0789 Other chest pain: Secondary | ICD-10-CM | POA: Diagnosis not present

## 2014-11-11 DIAGNOSIS — M549 Dorsalgia, unspecified: Secondary | ICD-10-CM | POA: Diagnosis not present

## 2014-11-11 DIAGNOSIS — E785 Hyperlipidemia, unspecified: Secondary | ICD-10-CM | POA: Diagnosis not present

## 2014-11-11 LAB — LIPID PANEL
CHOLESTEROL: 170 mg/dL (ref 0–200)
HDL: 35.7 mg/dL — AB (ref >39.00)
LDL Cholesterol: 110 mg/dL — ABNORMAL HIGH (ref 0–99)
NonHDL: 134.3
TRIGLYCERIDES: 122 mg/dL (ref 0.0–149.0)
Total CHOL/HDL Ratio: 5
VLDL: 24.4 mg/dL (ref 0.0–40.0)

## 2014-11-11 LAB — HEMOGLOBIN A1C: Hgb A1c MFr Bld: 6 % (ref 4.6–6.5)

## 2014-11-11 MED ORDER — HYDROCODONE-ACETAMINOPHEN 5-325 MG PO TABS: ORAL_TABLET | ORAL | Status: DC

## 2014-11-11 MED ORDER — PANTOPRAZOLE SODIUM 40 MG PO TBEC: 40.0000 mg | DELAYED_RELEASE_TABLET | Freq: Every day | ORAL | Status: DC

## 2014-11-11 MED ORDER — FENOFIBRATE 160 MG PO TABS: ORAL_TABLET | ORAL | Status: DC

## 2014-11-11 MED ORDER — CIPROFLOXACIN HCL 500 MG PO TABS: 500.0000 mg | ORAL_TABLET | Freq: Two times a day (BID) | ORAL | Status: DC

## 2014-11-11 MED ORDER — HYDROCODONE-ACETAMINOPHEN 5-325 MG PO TABS
ORAL_TABLET | ORAL | Status: DC
Start: 2014-11-11 — End: 2014-11-11

## 2014-11-11 NOTE — Progress Notes (Signed)
Pre visit review using our clinic review tool, if applicable. No additional management support is needed unless otherwise documented below in the visit note. 

## 2014-11-11 NOTE — Progress Notes (Signed)
OFFICE NOTE  11/11/2014  CC:  Chief Complaint  Patient presents with  . Follow-up     HPI: Patient is a 59 y.o. Caucasian female who is here for 4 mo f/u chronic LBP, HTN, tob dependence, prediabetes. Crestor increased to 20mg  qd after labs 74mo ago.   Saw Dr. Debara Pickett for f/u and was kept on atenolol although she has not felt improved as far as CP on this med. He commented she may have small vessel ischemia, may need long acting nitrate vs invasive coronary evaluation.  She went to ED 10/20/14 for CP.  Ruled out for MI, d/c'd home w/out further testing.  Says no f/u with her cardiologist is scheduled at this time. She quit smoking since 05/2014!    As far as her back goes, all is the same--says the cold weather is hard on her, though.  She has had about 1 mo of increased urinary urgency and frequency, sometimes not much comes out, some discomfort in urethral area, mild suprapubic discomfort.  No nausea.  Temp 100-101 on occasion.  Pertinent PMH:  Past medical, surgical, social, and family history reviewed and no changes are noted since last office visit.  MEDS:  Outpatient Prescriptions Prior to Visit  Medication Sig Dispense Refill  . atenolol (TENORMIN) 25 MG tablet Take 1 tablet (25 mg total) by mouth daily. 30 tablet 6  . cyclobenzaprine (FLEXERIL) 10 MG tablet take 1 tablet by mouth every 8 hours if needed (Patient taking differently: Take 10 mg by mouth 3 (three) times daily as needed. take 1 tablet by mouth every 8 hours if needed) 30 tablet 5  . esomeprazole (NEXIUM) 40 MG capsule Take 1 capsule (40 mg total) by mouth daily. 30 capsule 4  . fenofibrate 160 MG tablet take 1 tablet by mouth once daily with food 30 tablet 3  . HYDROcodone-acetaminophen (NORCO/VICODIN) 5-325 MG per tablet take 1 tablet by mouth every 6 hours if needed for pain 90 tablet 0  . LORazepam (ATIVAN) 1 MG tablet Take 1-2 tablets (1-2 mg total) by mouth daily as needed for anxiety. 30 tablet 5  .  nitroGLYCERIN (NITROSTAT) 0.4 MG SL tablet Place 1 tablet (0.4 mg total) under the tongue every 5 (five) minutes as needed for chest pain (MAX 3 doses.). 25 tablet 3  . rosuvastatin (CRESTOR) 20 MG tablet Take 1 tablet (20 mg total) by mouth daily. 30 tablet 6  . albuterol (PROVENTIL) (2.5 MG/3ML) 0.083% nebulizer solution Take 3 mLs (2.5 mg total) by nebulization every 4 (four) hours as needed for wheezing or shortness of breath. (Patient not taking: Reported on 11/11/2014) 75 mL 1   No facility-administered medications prior to visit.    PE: Blood pressure 123/76, pulse 74, temperature 98.1 F (36.7 C), temperature source Oral, resp. rate 16, height 5\' 5"  (1.651 m), weight 182 lb (82.555 kg), SpO2 96 %. Gen: Alert, well appearing.  Patient is oriented to person, place, time, and situation. CV: RRR, no m/r/g.   LUNGS: CTA bilat, nonlabored resps, good aeration in all lung fields. EXT: no clubbing, cyanosis, or edema.    LABS: CC UA today: trace lysed blood, otherwise normal.  RECENT: Lab Results  Component Value Date   CHOL 230* 07/14/2014   HDL 33.60* 07/14/2014   LDLCALC 111* 11/12/2013   LDLDIRECT 172.2 07/14/2014   TRIG 224.0* 07/14/2014   CHOLHDL 7 07/14/2014     Chemistry      Component Value Date/Time   NA 144 10/20/2014 2235  K 4.1 10/20/2014 2235   CL 106 10/20/2014 2235   CO2 26 10/20/2014 2235   BUN 16 10/20/2014 2235   CREATININE 0.64 10/20/2014 2235   CREATININE 0.69 06/29/2011 1607      Component Value Date/Time   CALCIUM 9.3 10/20/2014 2235   ALKPHOS 49 05/08/2014 1551   AST 17 05/08/2014 1551   ALT 14 05/08/2014 1551   BILITOT 0.2* 05/08/2014 1551     Lab Results  Component Value Date   WBC 9.9 10/20/2014   HGB 12.6 10/20/2014   HCT 37.9 10/20/2014   MCV 93.1 10/20/2014   PLT 251 10/20/2014   Lab Results  Component Value Date   CKTOTAL 74 10/25/2011   TROPONINI <0.30 10/21/2014     IMPRESSION AND PLAN:  1) Atypical CP: continue current  meds and will arrange f/u appt with her cardiologist, Dr. Debara Pickett, within the next few weeks so he can further consider what to do next.  2) Hyperlipidemia: recheck FLP today since increase in crestor to 20mg  qd 4 mo ago.  RF'd fenofibrate.  3) Chronic LBP: stable. I printed rx's for vicodin 5/325, 1 q6h prn, #90 today for this month, Feb, and March 2016.  Appropriate fill on/after date was noted on each rx.  4) Urinary sx's: suggestive of lower UTI, BUT UA today unremarkable.  Will send for c/s. Will treat empirically with cipro 500 mg bid x 5d. If not improved, then I suspect these sx's are due to urge incontinence.  Hyperglycemia doubtful, but rechecking HbA1c today.  5) HTN; The current medical regimen is effective;  continue present plan and medications. Recent lytes/cr stable.  An After Visit Summary was printed and given to the patient.   FOLLOW UP: 68mo

## 2014-11-12 ENCOUNTER — Telehealth: Payer: Self-pay | Admitting: Family Medicine

## 2014-11-12 NOTE — Telephone Encounter (Signed)
emmi emailed °

## 2014-11-13 LAB — URINE CULTURE

## 2014-12-16 ENCOUNTER — Telehealth: Payer: Self-pay | Admitting: Family Medicine

## 2014-12-16 NOTE — Telephone Encounter (Signed)
Rf request from pharmacy for lorazepam.  Patient last OV was 11/11/14.  Last RX printed 07/14/14 x 5.  Pt not due until 3/9. [facility] always sends early.  Please advise.

## 2014-12-16 NOTE — Telephone Encounter (Signed)
Sounds like I won't be authorizing any RF of this until the first week of march at the earliest.-thx

## 2015-02-07 ENCOUNTER — Other Ambulatory Visit: Payer: Self-pay | Admitting: Family Medicine

## 2015-02-07 DIAGNOSIS — M549 Dorsalgia, unspecified: Principal | ICD-10-CM

## 2015-02-07 DIAGNOSIS — G8929 Other chronic pain: Secondary | ICD-10-CM

## 2015-02-07 MED ORDER — HYDROCODONE-ACETAMINOPHEN 5-325 MG PO TABS: ORAL_TABLET | ORAL | Status: DC

## 2015-02-07 NOTE — Telephone Encounter (Signed)
Vicodin rx printed as per pt request. 

## 2015-02-07 NOTE — Telephone Encounter (Signed)
Pts. Husband will be in tomorrow to p/u her RX. She is giving her permission.

## 2015-02-07 NOTE — Telephone Encounter (Signed)
Refill request for Hydrocodone Last filled by MD on- 11/11/14 #90 x0 Last Appt: 11/11/2014 Next Appt: 03/10/2015 Please advise refill?

## 2015-03-10 ENCOUNTER — Encounter: Payer: Self-pay | Admitting: Family Medicine

## 2015-03-10 ENCOUNTER — Ambulatory Visit (INDEPENDENT_AMBULATORY_CARE_PROVIDER_SITE_OTHER): Payer: Medicare Other | Admitting: Family Medicine

## 2015-03-10 VITALS — BP 150/98 | HR 74 | Temp 97.6°F | Resp 16 | Wt 188.0 lb

## 2015-03-10 DIAGNOSIS — R072 Precordial pain: Secondary | ICD-10-CM | POA: Diagnosis not present

## 2015-03-10 DIAGNOSIS — E785 Hyperlipidemia, unspecified: Secondary | ICD-10-CM | POA: Diagnosis not present

## 2015-03-10 DIAGNOSIS — I10 Essential (primary) hypertension: Secondary | ICD-10-CM | POA: Diagnosis not present

## 2015-03-10 DIAGNOSIS — M549 Dorsalgia, unspecified: Secondary | ICD-10-CM | POA: Diagnosis not present

## 2015-03-10 DIAGNOSIS — G8929 Other chronic pain: Secondary | ICD-10-CM

## 2015-03-10 LAB — COMPREHENSIVE METABOLIC PANEL
ALK PHOS: 51 U/L (ref 39–117)
ALT: 22 U/L (ref 0–35)
AST: 21 U/L (ref 0–37)
Albumin: 4.3 g/dL (ref 3.5–5.2)
BILIRUBIN TOTAL: 0.6 mg/dL (ref 0.2–1.2)
BUN: 13 mg/dL (ref 6–23)
CO2: 29 mEq/L (ref 19–32)
Calcium: 10.2 mg/dL (ref 8.4–10.5)
Chloride: 106 mEq/L (ref 96–112)
Creatinine, Ser: 0.62 mg/dL (ref 0.40–1.20)
GFR: 104.81 mL/min (ref >60.00)
Glucose, Bld: 107 mg/dL — ABNORMAL HIGH (ref 70–99)
POTASSIUM: 4.4 meq/L (ref 3.5–5.1)
SODIUM: 141 meq/L (ref 135–145)
TOTAL PROTEIN: 7.2 g/dL (ref 6.0–8.3)

## 2015-03-10 LAB — LIPID PANEL
CHOL/HDL RATIO: 3
Cholesterol: 154 mg/dL (ref 0–200)
HDL: 47.8 mg/dL (ref >39.00)
LDL CALC: 83 mg/dL (ref 0–99)
NONHDL: 106.2
Triglycerides: 114 mg/dL (ref 0.0–149.0)
VLDL: 22.8 mg/dL (ref 0.0–40.0)

## 2015-03-10 MED ORDER — HYDROCODONE-ACETAMINOPHEN 5-325 MG PO TABS: ORAL_TABLET | ORAL | Status: DC

## 2015-03-10 MED ORDER — ATENOLOL 25 MG PO TABS: 25.0000 mg | ORAL_TABLET | Freq: Two times a day (BID) | ORAL | Status: DC

## 2015-03-10 MED ORDER — CYCLOBENZAPRINE HCL 10 MG PO TABS: ORAL_TABLET | ORAL | Status: DC

## 2015-03-10 MED ORDER — LORAZEPAM 1 MG PO TABS: 1.0000 mg | ORAL_TABLET | Freq: Every day | ORAL | Status: DC | PRN

## 2015-03-10 NOTE — Progress Notes (Signed)
OFFICE NOTE  03/19/2015  CC:  Chief Complaint  Patient presents with  . Follow-up     HPI: Patient is a 59 y.o. Caucasian female who is here for 4 mo f/u chronic LBP, HTN, hyperlipidemia. No home bp measurements.   Use of pain meds for her back has been "about the same" but she had 2 spells of more intense LBP with bilat leg pains that lasted 4-5 days each.  No trauma or overuse that provoked it. During those periods she had to use a full 3 tabs per day whereas some days when her pain is not as intense she doesn't have to use 3.  She is now recovered from her most recent exacerbation.  Complains of still having problems with chest pains, never got to see Dr. Debara Pickett after our last f/u 4 mo ago.  Dull, burning type pain in left/central chest.  Sometimes wakes her up from sleep and sometimes starts while walking.  If it starts while walking, she stops and rests approx 20 min and says it usually is still present enough to take a sl nitro tab.  After 1 tab the pain goes away in < 5 min.  Also has random/non-exercise induced episodes of the same pain.  In these instances she usually waits 20 min for it to resolve but usually has to take a sl nitro.  No ED visits since last o/v with me.  Assoc sx's: +sweating, mild nausea.  No arm or jaw pain.  No dizziness.   No clear relation of these CP episodes to eating/postprandial timeframe.    Past Medical History  Diagnosis Date  . Chronic LBP   . DDD (degenerative disc disease)     spinal stenosis  . Hyperlipidemia     mixed  . History of GI bleed     NSAIDS  . Recurrent UTI   . Microscopic hematuria     full w/ u unrevealing X 2  . Tobacco dependence in remission     Quit fall 2015.  Only occasional bronchitis illness (CT shest 2009 nl- for f/u of ? nodule on CXR)  . Atypical chest pain 2007; 2015    cardiolyte neg, echo nl, cath showed mild/nonobstructive LAD disease  . Normal nuclear stress test 11/11 and 06/2014    negative, EF normal  .  Palpitations 2006    48H holter neg  . Osteoarthritis of knee 06/30/2012    Right   . COPD (chronic obstructive pulmonary disease)   . Fatty liver    Past Surgical History  Procedure Laterality Date  . Lumbar spine surgery  1993    right iliac crest bone graft+metal instrumentation; 2005 metal removal and decompression, 2011 lumbar decompression 4-11, then stabilization/ instrumentation done 09-19-10: L2,L3, L5 left and L2 , L3, L4 right pedical remnant L4 left embedded. Left iliac crest bone graft-- Dr Velna Ochs  . Appendectomy  1984  . Abdominal hysterectomy  1997    DUB  . Oophorectomy Right     cyst  . Tonsillectomy  59 yrs old  . Knee arthroscopy Bilateral   . Left wrist ganglion cyst excision  2010  . Tonsillectomy    . Cardiac catheterization  10/09/2005    no CAD, mildly elevated LVEDP, normal LV function (Dr. Gerrie Nordmann)  . Cardiovascular stress test  06/2014    normal lexiscan NST  . Transthoracic echocardiogram  2006    EF=>55%, trace MR, mild TR, trace AV regurg, trace pulm valve regurg   .  Cardiovascular stress test  2006    persantine - no ischemia, low risk      MEDS:  Outpatient Prescriptions Prior to Visit  Medication Sig Dispense Refill  . albuterol (PROVENTIL) (2.5 MG/3ML) 0.083% nebulizer solution Take 3 mLs (2.5 mg total) by nebulization every 4 (four) hours as needed for wheezing or shortness of breath. 75 mL 1  . fenofibrate 160 MG tablet take 1 tablet by mouth once daily with food 30 tablet 6  . nitroGLYCERIN (NITROSTAT) 0.4 MG SL tablet Place 1 tablet (0.4 mg total) under the tongue every 5 (five) minutes as needed for chest pain (MAX 3 doses.). 25 tablet 3  . pantoprazole (PROTONIX) 40 MG tablet Take 1 tablet (40 mg total) by mouth daily. 30 tablet 11  . rosuvastatin (CRESTOR) 20 MG tablet Take 1 tablet (20 mg total) by mouth daily. 30 tablet 6  . atenolol (TENORMIN) 25 MG tablet Take 1 tablet (25 mg total) by mouth daily. 30 tablet 6  . ciprofloxacin  (CIPRO) 500 MG tablet Take 1 tablet (500 mg total) by mouth 2 (two) times daily. 10 tablet 0  . cyclobenzaprine (FLEXERIL) 10 MG tablet take 1 tablet by mouth every 8 hours if needed (Patient taking differently: Take 10 mg by mouth 3 (three) times daily as needed. take 1 tablet by mouth every 8 hours if needed) 30 tablet 5  . HYDROcodone-acetaminophen (NORCO/VICODIN) 5-325 MG per tablet take 1 tablet by mouth every 6 hours if needed for pain 90 tablet 0  . LORazepam (ATIVAN) 1 MG tablet Take 1-2 tablets (1-2 mg total) by mouth daily as needed for anxiety. 30 tablet 5   No facility-administered medications prior to visit.    PE: Blood pressure 150/98, pulse 74, temperature 97.6 F (36.4 C), temperature source Temporal, resp. rate 16, weight 188 lb (85.276 kg), SpO2 92 %. Gen: Alert, well appearing.  Patient is oriented to person, place, time, and situation. EZM:OQHU: no injection, icteris, swelling, or exudate.  EOMI, PERRLA. Mouth: lips without lesion/swelling.  Oral mucosa pink and moist. Oropharynx without erythema, exudate, or swelling.  CV: RRR, no m/r/g.   LUNGS: CTA bilat, nonlabored resps, good aeration in all lung fields. EXT: no clubbing, cyanosis, or edema.    LABS:  Lab Results  Component Value Date   CHOL 154 03/10/2015   HDL 47.80 03/10/2015   LDLCALC 83 03/10/2015   LDLDIRECT 172.2 07/14/2014   TRIG 114.0 03/10/2015   CHOLHDL 3 03/10/2015     Chemistry      Component Value Date/Time   NA 141 03/10/2015 1443   K 4.4 03/10/2015 1443   CL 106 03/10/2015 1443   CO2 29 03/10/2015 1443   BUN 13 03/10/2015 1443   CREATININE 0.62 03/10/2015 1443   CREATININE 0.69 06/29/2011 1607      Component Value Date/Time   CALCIUM 10.2 03/10/2015 1443   ALKPHOS 51 03/10/2015 1443   AST 21 03/10/2015 1443   ALT 22 03/10/2015 1443   BILITOT 0.6 03/10/2015 1443     Lab Results  Component Value Date   TSH 0.91 11/12/2013   Lab Results  Component Value Date   WBC 9.9  10/20/2014   HGB 12.6 10/20/2014   HCT 37.9 10/20/2014   MCV 93.1 10/20/2014   PLT 251 10/20/2014   12 lead EKG: NSR, no ectopy, no ischemic changes, no LVH.  IMPRESSION AND PLAN:  1) Chest pain, some features of typical stable angina and some atypical features. EKG today reassuring.  Will arrange f/u with her cardiologist in [place], Dr. Debara Pickett, as soon as possible. Will increase her atenolol to 25mg  bid.  She is not currently on any anti-platelet med--has NSAID allergy.   ? Plavix at next cardiology f/u?  2) Hyperlipidemia: tolerating statin and fibrate.  FLP and transaminase check today.  3) HTN; The current medical regimen is effective;  continue present plan and medications. Check lytes/cr today.  Encouraged pt to monitor bp periodically at home so we could have more data base management decisions on.  4) Chronic low back pain/DDD: stable at this time but pt still has periods of acute worsening that she goes through. I am managing her pain with Vicodin 5/325, 1 tab q6h prn, #90 per month: rx's given to pt today for this month, June 2016, and July 2016, each with appropriate fill on/after date on it.  Flexeril RF'd as well.  5) Anxiety: RF'd benzo today.  An After Visit Summary was printed and given to the patient.  FOLLOW UP: 4 mo

## 2015-03-10 NOTE — Progress Notes (Signed)
Pre visit review using our clinic review tool, if applicable. No additional management support is needed unless otherwise documented below in the visit note. 

## 2015-03-21 ENCOUNTER — Telehealth: Payer: Self-pay

## 2015-03-21 NOTE — Telephone Encounter (Signed)
LMOVM asking patient to CB 

## 2015-03-22 NOTE — Telephone Encounter (Signed)
No call back, Mychart message sent.

## 2015-03-30 ENCOUNTER — Encounter: Payer: Self-pay | Admitting: Family Medicine

## 2015-05-04 ENCOUNTER — Encounter: Payer: Self-pay | Admitting: Internal Medicine

## 2015-05-04 ENCOUNTER — Ambulatory Visit (INDEPENDENT_AMBULATORY_CARE_PROVIDER_SITE_OTHER): Payer: Medicare Other | Admitting: Internal Medicine

## 2015-05-04 VITALS — BP 138/80 | HR 72 | Ht 65.0 in | Wt 190.0 lb

## 2015-05-04 DIAGNOSIS — I200 Unstable angina: Secondary | ICD-10-CM | POA: Diagnosis not present

## 2015-05-04 DIAGNOSIS — F411 Generalized anxiety disorder: Secondary | ICD-10-CM

## 2015-05-04 DIAGNOSIS — E785 Hyperlipidemia, unspecified: Secondary | ICD-10-CM

## 2015-05-04 DIAGNOSIS — F17200 Nicotine dependence, unspecified, uncomplicated: Secondary | ICD-10-CM

## 2015-05-04 DIAGNOSIS — Z01812 Encounter for preprocedural laboratory examination: Secondary | ICD-10-CM

## 2015-05-04 DIAGNOSIS — D689 Coagulation defect, unspecified: Secondary | ICD-10-CM | POA: Diagnosis not present

## 2015-05-04 DIAGNOSIS — R5383 Other fatigue: Secondary | ICD-10-CM

## 2015-05-04 DIAGNOSIS — Z720 Tobacco use: Secondary | ICD-10-CM

## 2015-05-04 DIAGNOSIS — Z79899 Other long term (current) drug therapy: Secondary | ICD-10-CM | POA: Diagnosis not present

## 2015-05-04 NOTE — Progress Notes (Signed)
OFFICE NOTE  Chief Complaint:  Progressive chest pain, nitrate responsive  Primary Care Physician: Tammi Sou, MD  HPI:  Brenda [name] is a pleasant 59 year old female, referred to me for evaluation of chest pain. She had a previous evaluation of chest pain by Dr. Leslye Peer about 10 years ago including a cardiac catheterization that showed a moderate stenosis and one-vessel. No stents were placed at that time. There is a family history of heart disease and her father had a heart attack in his 79s. Briefly she been having some chest pain and went to the emergency department at the end of June in any pain. She was out for MI and was sent home. She's also had progressive chest tightness with minimal exertion. The other night she had chest pain which woke her up and this is somewhat worrisome for her. Just he has dyslipidemia but is well treated on Crestor and fenofibrate. She does have an allergy to aspirin and is not currently on any antiplatelet therapy.  Brenda [name] was recently seen in follow-up. Her nuclear stress test was negative for ischemia. She continues to report short episodes of ongoing chest pain which are sometimes related to exertion but other times occur at rest. I elected to start low-dose beta blocker to see if it improved her symptoms and she reports very little difference. She did stop smoking about 4 weeks ago and so far is doing well. I commended her on that. Her symptoms certainly could be do to small vessel ischemia and one must consider this possibility.  I saw Brenda [name] back in the office today. She reports that she's had progressive chest pain. Her last stress test was in 2015 and negative for ischemia, but recently she's been using nitroglycerin about 4-5 times per week and does get some mild improvement in her chest pain. She says the symptoms have been coming more frequently and she stopped her walking and exercise because of it. Because of this she  attributes a 30 pound weight gain. She's also had worsening back pain because of her inability to walk.  PMHx:  Past Medical History  Diagnosis Date  . Chronic LBP   . DDD (degenerative disc disease)     spinal stenosis  . Hyperlipidemia     mixed  . History of GI bleed     NSAIDS  . Recurrent UTI   . Microscopic hematuria     full w/ u unrevealing X 2  . Tobacco dependence in remission     Quit fall 2015.  Only occasional bronchitis illness (CT shest 2009 nl- for f/u of ? nodule on CXR)  . Atypical chest pain 2007; 2015    cardiolyte neg, echo nl, cath showed mild/nonobstructive LAD disease  . Normal nuclear stress test 11/11 and 06/2014    negative, EF normal  . Palpitations 2006    48H holter neg  . Osteoarthritis of knee 06/30/2012    Right   . COPD (chronic obstructive pulmonary disease)   . Fatty liver     Past Surgical History  Procedure Laterality Date  . Lumbar spine surgery  1993    right iliac crest bone graft+metal instrumentation; 2005 metal removal and decompression, 2011 lumbar decompression 4-11, then stabilization/ instrumentation done 09-19-10: L2,L3, L5 left and L2 , L3, L4 right pedical remnant L4 left embedded. Left iliac crest bone graft-- Dr Velna Ochs  . Appendectomy  1984  . Abdominal hysterectomy  1997    DUB  . Oophorectomy  Right     cyst  . Tonsillectomy  59 yrs old  . Knee arthroscopy Bilateral   . Left wrist ganglion cyst excision  2010  . Tonsillectomy    . Cardiac catheterization  10/09/2005    no CAD, mildly elevated LVEDP, normal LV function (Dr. Gerrie Nordmann)  . Cardiovascular stress test  06/2014    normal lexiscan NST  . Transthoracic echocardiogram  2006    EF=>55%, trace MR, mild TR, trace AV regurg, trace pulm valve regurg   . Cardiovascular stress test  2006    persantine - no ischemia, low risk     FAMHx:  Family History  Problem Relation Age of Onset  . Coronary artery disease Neg Hx   . Heart Problems Mother     and thyroid  problems  . Hyperlipidemia Mother   . Diabetes Mother   . Breast cancer Mother   . Heart failure Father     CHF, heart attack, diabetes, hyperlipidemia  . Heart disease Brother     back problems  . Hypertension Brother   . Cancer Paternal Grandmother     mouth - snuff  . Heart attack Paternal Grandfather     stroke, HTN  . Hyperlipidemia Brother   . Heart attack Brother   . Cancer Brother   . Stroke Maternal Grandfather   . Kidney failure Maternal Grandmother     SOCHx:   reports that she quit smoking about 9 months ago. Her smoking use included Cigarettes. She has a 35 pack-year smoking history. She has never used smokeless tobacco. She reports that she does not drink alcohol or use illicit drugs.  ALLERGIES:  Allergies  Allergen Reactions  . Aspirin Other (See Comments)    REACTION: GI Bleed  . Beta Adrenergic Blockers Other (See Comments)    REACTION: decreased libido  . Nsaids Other (See Comments)    REACTION: GI Upset  . Penicillins Swelling  . Prochlorperazine Edisylate Other (See Comments)    REACTION: Stroke like symptoms  . Sulfonamide Derivatives Other (See Comments)    REACTION: unsure rxn  . Amitriptyline Hcl Palpitations    REACTION: increased heart rate    ROS: A comprehensive review of systems was negative except for: Cardiovascular: positive for chest pain  HOME MEDS: Current Outpatient Prescriptions  Medication Sig Dispense Refill  . albuterol (PROVENTIL) (2.5 MG/3ML) 0.083% nebulizer solution Take 3 mLs (2.5 mg total) by nebulization every 4 (four) hours as needed for wheezing or shortness of breath. 75 mL 1  . atenolol (TENORMIN) 25 MG tablet Take 1 tablet (25 mg total) by mouth 2 (two) times daily. 60 tablet 6  . cyclobenzaprine (FLEXERIL) 10 MG tablet take 1 tablet by mouth every 8 hours if needed 30 tablet 5  . fenofibrate 160 MG tablet take 1 tablet by mouth once daily with food 30 tablet 6  . HYDROcodone-acetaminophen (NORCO/VICODIN) 5-325 MG  per tablet take 1 tablet by mouth every 6 hours if needed for pain 90 tablet 0  . LORazepam (ATIVAN) 1 MG tablet Take 1-2 tablets (1-2 mg total) by mouth daily as needed for anxiety. 30 tablet 5  . nitroGLYCERIN (NITROSTAT) 0.4 MG SL tablet Place 1 tablet (0.4 mg total) under the tongue every 5 (five) minutes as needed for chest pain (MAX 3 doses.). 25 tablet 3  . pantoprazole (PROTONIX) 40 MG tablet Take 1 tablet (40 mg total) by mouth daily. 30 tablet 11  . rosuvastatin (CRESTOR) 20 MG tablet Take 1 tablet (  20 mg total) by mouth daily. 30 tablet 6   No current facility-administered medications for this visit.    LABS/IMAGING: No results found for this or any previous visit (from the past 48 hour(s)). No results found.  VITALS: BP 138/80 mmHg  Pulse 72  Ht 5\' 5"  (1.651 m)  Wt 190 lb (86.183 kg)  BMI 31.62 kg/m2  EXAM: General appearance: alert and no distress Neck: no carotid bruit, no JVD and thyroid not enlarged, symmetric, no tenderness/mass/nodules Lungs: clear to auscultation bilaterally Heart: regular rate and rhythm, S1, S2 normal, no murmur, click, rub or gallop Abdomen: soft, non-tender; bowel sounds normal; no masses,  no organomegaly Extremities: extremities normal, atraumatic, no cyanosis or edema Pulses: 2+ and symmetric Skin: Skin color, texture, turgor normal. No rashes or lesions Neurologic: Grossly normal PSych: Mildly anxious  EKG: deferred  ASSESSMENT: 1. Progressive chest pain, concerning for unstable angina 2. History of moderate coronary artery disease - negative nuclear stress test in August 2015 3. Dyslipidemia 4. Family history of premature coronary disease  PLAN: 10.   Brenda [name] has had little improvement with medical optimization for coronary artery disease. She does have a history of moderate obstruction dating back to catheterization in the distant past. She is recently been using more nitroglycerin and does get some relief from that. Her  symptoms are consistent with unstable angina. I'm recommending heart catheterization. We discussed risks and benefits of the procedure in the office today and she is willing to proceed. I suspect we can perform this with a right radial artery approach.  We will try to arrange for catheterization within the next couple of weeks. She was advised that if she needed to take more than 3 nitroglycerin's that she should go to the emergency room for persistent or unrelenting chest pain.  Pixie Casino, MD, [facility] Attending Cardiologist [place] 05/04/2015, 12:59 PM

## 2015-05-04 NOTE — Patient Instructions (Signed)
Your physician has requested that you have a cardiac catheterization @ [facility] for unstable angina with Dr. Debara Pickett. Cardiac catheterization is used to diagnose and/or treat various heart conditions. Doctors may recommend this procedure for a number of different reasons. The most common reason is to evaluate chest pain. Chest pain can be a symptom of coronary artery disease (CAD), and cardiac catheterization can show whether plaque is narrowing or blocking your heart's arteries. This procedure is also used to evaluate the valves, as well as measure the blood flow and oxygen levels in different parts of your heart. For further information please visit [email or web address]. Please follow instruction sheet, as given.  Following your catheterization, you will not be allowed to drive for 3 days.  No lifting, pushing, or pulling greater that 10 pounds is allowed for 1 week.  You will be required to have the following tests prior to the procedure:  1. Blood work - the blood work can be done no more than 7 days prior to the procedure.  It can be done at any [facility] lab. There is a lab downstairs on the first floor of this building in [address] and one at [address].

## 2015-05-10 ENCOUNTER — Other Ambulatory Visit (HOSPITAL_COMMUNITY)
Admission: RE | Admit: 2015-05-10 | Discharge: 2015-05-10 | Disposition: A | Discharge status: Home / Self Care | Payer: Medicare Other | Source: Ambulatory Visit | Attending: Internal Medicine | Admitting: Internal Medicine

## 2015-05-10 DIAGNOSIS — R5383 Other fatigue: Secondary | ICD-10-CM | POA: Diagnosis not present

## 2015-05-10 DIAGNOSIS — Z01812 Encounter for preprocedural laboratory examination: Secondary | ICD-10-CM | POA: Insufficient documentation

## 2015-05-10 DIAGNOSIS — D689 Coagulation defect, unspecified: Secondary | ICD-10-CM | POA: Diagnosis not present

## 2015-05-10 DIAGNOSIS — Z79899 Other long term (current) drug therapy: Secondary | ICD-10-CM | POA: Insufficient documentation

## 2015-05-10 LAB — BASIC METABOLIC PANEL
Anion gap: 4 — ABNORMAL LOW (ref 5–15)
BUN: 16 mg/dL (ref 6–20)
CALCIUM: 8.9 mg/dL (ref 8.9–10.3)
CHLORIDE: 110 mmol/L (ref 101–111)
CO2: 28 mmol/L (ref 22–32)
CREATININE: 0.74 mg/dL (ref 0.44–1.00)
GFR calc Af Amer: >60 mL/min (ref >60)
GFR calc non Af Amer: >60 mL/min (ref >60)
Glucose, Bld: 116 mg/dL — ABNORMAL HIGH (ref 65–99)
Potassium: 4 mmol/L (ref 3.5–5.1)
SODIUM: 142 mmol/L (ref 135–145)

## 2015-05-10 LAB — CBC
HCT: 38.1 % (ref 36.0–46.0)
Hemoglobin: 12.8 g/dL (ref 12.0–15.0)
MCH: 30.9 pg (ref 26.0–34.0)
MCHC: 33.6 g/dL (ref 30.0–36.0)
MCV: 92 fL (ref 78.0–100.0)
PLATELETS: 203 10*3/uL (ref 150–400)
RBC: 4.14 MIL/uL (ref 3.87–5.11)
RDW: 13.3 % (ref 11.5–15.5)
WBC: 8.3 10*3/uL (ref 4.0–10.5)

## 2015-05-10 LAB — TSH: TSH: 0.807 u[IU]/mL (ref 0.350–4.500)

## 2015-05-10 LAB — PROTIME-INR
INR: 0.97 (ref 0.00–1.49)
PROTHROMBIN TIME: 13.1 s (ref 11.6–15.2)

## 2015-05-10 LAB — APTT: APTT: 30 s (ref 24–37)

## 2015-05-16 ENCOUNTER — Encounter (HOSPITAL_COMMUNITY): Payer: Self-pay | Admitting: Internal Medicine

## 2015-05-16 ENCOUNTER — Encounter (HOSPITAL_COMMUNITY)
Admission: RE | Disposition: A | Discharge status: Home / Self Care | Payer: Medicare Other | Source: Ambulatory Visit | Attending: Internal Medicine

## 2015-05-16 ENCOUNTER — Ambulatory Visit (HOSPITAL_COMMUNITY)
Admission: RE | Admit: 2015-05-16 | Discharge: 2015-05-16 | Disposition: A | Discharge status: Home / Self Care | Payer: Medicare Other | Source: Ambulatory Visit | Attending: Internal Medicine | Admitting: Internal Medicine

## 2015-05-16 DIAGNOSIS — I25110 Atherosclerotic heart disease of native coronary artery with unstable angina pectoris: Secondary | ICD-10-CM | POA: Insufficient documentation

## 2015-05-16 DIAGNOSIS — I200 Unstable angina: Secondary | ICD-10-CM | POA: Diagnosis present

## 2015-05-16 DIAGNOSIS — I2510 Atherosclerotic heart disease of native coronary artery without angina pectoris: Secondary | ICD-10-CM | POA: Diagnosis present

## 2015-05-16 DIAGNOSIS — E785 Hyperlipidemia, unspecified: Secondary | ICD-10-CM | POA: Diagnosis present

## 2015-05-16 HISTORY — PX: CARDIAC CATHETERIZATION: SHX172

## 2015-05-16 SURGERY — LEFT HEART CATH AND CORONARY ANGIOGRAPHY
Anesthesia: LOCAL

## 2015-05-16 MED ORDER — MORPHINE SULFATE 2 MG/ML IJ SOLN: 2.0000 mg | INTRAMUSCULAR | Status: DC | PRN

## 2015-05-16 MED ORDER — MIDAZOLAM HCL 2 MG/2ML IJ SOLN
INTRAMUSCULAR | Status: AC
  Filled 2015-05-16: qty 2

## 2015-05-16 MED ORDER — HEPARIN SODIUM (PORCINE) 1000 UNIT/ML IJ SOLN
INTRAMUSCULAR | Status: AC
  Filled 2015-05-16: qty 1

## 2015-05-16 MED ORDER — SODIUM CHLORIDE 0.9 % IV SOLN: 250.0000 mL | INTRAVENOUS | Status: DC | PRN

## 2015-05-16 MED ORDER — HEPARIN (PORCINE) IN NACL 2-0.9 UNIT/ML-% IJ SOLN
INTRAMUSCULAR | Status: AC
  Filled 2015-05-16: qty 1000

## 2015-05-16 MED ORDER — SODIUM CHLORIDE 0.9 % IJ SOLN: 3.0000 mL | INTRAMUSCULAR | Status: DC | PRN

## 2015-05-16 MED ORDER — HEPARIN SODIUM (PORCINE) 1000 UNIT/ML IJ SOLN
INTRAMUSCULAR | Status: DC | PRN
  Administered 2015-05-16: 4000 [IU] via INTRAVENOUS

## 2015-05-16 MED ORDER — ISOSORBIDE MONONITRATE ER 30 MG PO TB24: 30.0000 mg | ORAL_TABLET | Freq: Every day | ORAL | Status: DC

## 2015-05-16 MED ORDER — SODIUM CHLORIDE 0.9 % WEIGHT BASED INFUSION
3.0000 mL/kg/h | INTRAVENOUS | Status: AC
  Administered 2015-05-16: 3 mL/kg/h via INTRAVENOUS

## 2015-05-16 MED ORDER — ONDANSETRON HCL 4 MG/2ML IJ SOLN: 4.0000 mg | Freq: Four times a day (QID) | INTRAMUSCULAR | Status: DC | PRN

## 2015-05-16 MED ORDER — SODIUM CHLORIDE 0.9 % IJ SOLN: 3.0000 mL | Freq: Two times a day (BID) | INTRAMUSCULAR | Status: DC

## 2015-05-16 MED ORDER — SODIUM CHLORIDE 0.9 % IV SOLN
INTRAVENOUS | Status: DC
  Administered 2015-05-16: 07:00:00 via INTRAVENOUS

## 2015-05-16 MED ORDER — CLOPIDOGREL BISULFATE 75 MG PO TABS: 75.0000 mg | ORAL_TABLET | Freq: Every day | ORAL | Status: DC

## 2015-05-16 MED ORDER — NITROGLYCERIN 1 MG/10 ML FOR IR/CATH LAB
INTRA_ARTERIAL | Status: AC
  Filled 2015-05-16: qty 10

## 2015-05-16 MED ORDER — IOHEXOL 300 MG/ML  SOLN
INTRAMUSCULAR | Status: DC | PRN
  Administered 2015-05-16: 50 mL via INTRA_ARTERIAL

## 2015-05-16 MED ORDER — ACETAMINOPHEN 325 MG PO TABS: 650.0000 mg | ORAL_TABLET | ORAL | Status: DC | PRN

## 2015-05-16 MED ORDER — FENTANYL CITRATE (PF) 100 MCG/2ML IJ SOLN
INTRAMUSCULAR | Status: AC
  Filled 2015-05-16: qty 2

## 2015-05-16 MED ORDER — MIDAZOLAM HCL 2 MG/2ML IJ SOLN
INTRAMUSCULAR | Status: DC | PRN
  Administered 2015-05-16 (×2): 1 mg via INTRAVENOUS
  Administered 2015-05-16: 2 mg via INTRAVENOUS

## 2015-05-16 MED ORDER — LIDOCAINE HCL (PF) 1 % IJ SOLN
INTRAMUSCULAR | Status: DC | PRN
  Administered 2015-05-16: 09:00:00

## 2015-05-16 MED ORDER — VERAPAMIL HCL 2.5 MG/ML IV SOLN
INTRAVENOUS | Status: AC
  Filled 2015-05-16: qty 2

## 2015-05-16 MED ORDER — RADIAL COCKTAIL (HEPARIN/VERAPAMIL/LIDOCAINE/NITRO)
Status: DC | PRN
  Administered 2015-05-16: 1 via INTRA_ARTERIAL

## 2015-05-16 MED ORDER — LIDOCAINE HCL (PF) 1 % IJ SOLN
INTRAMUSCULAR | Status: AC
  Filled 2015-05-16: qty 30

## 2015-05-16 MED ORDER — FENTANYL CITRATE (PF) 100 MCG/2ML IJ SOLN
INTRAMUSCULAR | Status: DC | PRN
  Administered 2015-05-16: 50 ug via INTRAVENOUS
  Administered 2015-05-16 (×2): 25 ug via INTRAVENOUS

## 2015-05-16 SURGICAL SUPPLY — 17 items
CATH INFINITI 5 FR JL3.5 (CATHETERS) ×2 IMPLANT
CATH INFINITI 5FR AL1 (CATHETERS) ×2 IMPLANT
CATH INFINITI 5FR ANG PIGTAIL (CATHETERS) ×2 IMPLANT
CATH INFINITI 5FR MULTPACK ANG (CATHETERS) IMPLANT
CATH LAUNCHER 5F RADL (CATHETERS) ×1 IMPLANT
CATH OPTITORQUE TIG 4.0 5F (CATHETERS) ×2 IMPLANT
CATHETER LAUNCHER 5F RADL (CATHETERS) ×2
DEVICE RAD COMP TR BAND LRG (VASCULAR PRODUCTS) ×2 IMPLANT
GLIDESHEATH SLEND SS 6F .021 (SHEATH) ×2 IMPLANT
KIT HEART LEFT (KITS) ×2 IMPLANT
PACK CARDIAC CATHETERIZATION (CUSTOM PROCEDURE TRAY) ×2 IMPLANT
SHEATH PINNACLE 5F 10CM (SHEATH) IMPLANT
SYR MEDRAD MARK V 150ML (SYRINGE) ×2 IMPLANT
TRANSDUCER W/STOPCOCK (MISCELLANEOUS) ×2 IMPLANT
TUBING CIL FLEX 10 FLL-RA (TUBING) ×2 IMPLANT
WIRE EMERALD 3MM-J .035X150CM (WIRE) IMPLANT
WIRE SAFE-T 1.5MM-J .035X260CM (WIRE) ×2 IMPLANT

## 2015-05-16 NOTE — Discharge Instructions (Signed)
Radial Site Care °Refer to this sheet in the next few weeks. These instructions provide you with information on caring for yourself after your procedure. Your caregiver may also give you more specific instructions. Your treatment has been planned according to current medical practices, but problems sometimes occur. Call your caregiver if you have any problems or questions after your procedure. °HOME CARE INSTRUCTIONS °· You may shower the day after the procedure. Remove the bandage (dressing) and gently wash the site with plain soap and water. Gently pat the site dry. °· Do not apply powder or lotion to the site. °· Do not submerge the affected site in water for 3 to 5 days. °· Inspect the site at least twice daily. °· Do not flex or bend the affected arm for 24 hours. °· No lifting over 5 pounds (2.3 kg) for 5 days after your procedure. °· Do not drive home if you are discharged the same day of the procedure. Have someone else drive you. °· You may drive 24 hours after the procedure unless otherwise instructed by your caregiver. °· Do not operate machinery or power tools for 24 hours. °· A responsible adult should be with you for the first 24 hours after you arrive home. °What to expect: °· Any bruising will usually fade within 1 to 2 weeks. °· Blood that collects in the tissue (hematoma) may be painful to the touch. It should usually decrease in size and tenderness within 1 to 2 weeks. °SEEK IMMEDIATE MEDICAL CARE IF: °· You have unusual pain at the radial site. °· You have redness, warmth, swelling, or pain at the radial site. °· You have drainage (other than a small amount of blood on the dressing). °· You have chills. °· You have a fever or persistent symptoms for more than 72 hours. °· You have a fever and your symptoms suddenly get worse. °· Your arm becomes pale, cool, tingly, or numb. °· You have heavy bleeding from the site. Hold pressure on the site. °Document Released: 11/24/2010 Document Revised:  01/14/2012 Document Reviewed: 11/24/2010 °ExitCare® Patient Information ©2015 [employer]. This information is not intended to replace advice given to you by your health care provider. Make sure you discuss any questions you have with your health care provider. ° °

## 2015-05-16 NOTE — H&P (Signed)
    INTERVAL PROCEDURE H&P  History and Physical Interval Note:  05/16/2015 7:34 AM  Brenda [name] has presented today for their planned procedure. The various methods of treatment have been discussed with the patient and family. After consideration of risks, benefits and other options for treatment, the patient has consented to the procedure.  The patients' outpatient history has been reviewed, patient examined, and no change in status from most recent office note within the past 30 days. I have reviewed the patients' chart and labs and will proceed as planned. Questions were answered to the patient's satisfaction.   Cath Lab Visit (complete for each Cath Lab visit)  Clinical Evaluation Leading to the Procedure:   ACS: No.  Non-ACS:    Anginal Classification: CCS III  Anti-ischemic medical therapy: Maximal Therapy (2 or more classes of medications)  Non-Invasive Test Results: No non-invasive testing performed  Prior CABG: No previous CABG  Pixie Casino, MD, [facility] Attending Cardiologist [place] 05/16/2015, 7:34 AM

## 2015-05-17 ENCOUNTER — Encounter (HOSPITAL_COMMUNITY): Payer: Self-pay | Admitting: Internal Medicine

## 2015-05-17 MED FILL — Nitroglycerin IV Soln 100 MCG/ML in D5W: INTRA_ARTERIAL | Qty: 10 | Status: AC

## 2015-06-08 ENCOUNTER — Other Ambulatory Visit: Payer: Self-pay | Admitting: Family Medicine

## 2015-06-08 DIAGNOSIS — M549 Dorsalgia, unspecified: Principal | ICD-10-CM

## 2015-06-08 DIAGNOSIS — G8929 Other chronic pain: Secondary | ICD-10-CM

## 2015-06-08 MED ORDER — HYDROCODONE-ACETAMINOPHEN 5-325 MG PO TABS: ORAL_TABLET | ORAL | Status: DC

## 2015-06-08 NOTE — Telephone Encounter (Signed)
Patient is running out of Hydrocodone. Her husband is going to be in [facility] tomorrow. Can her pick up Rx?

## 2015-06-08 NOTE — Telephone Encounter (Signed)
RF request for hydrocodone LOV: 03/10/15 Next ov: 07/14/15 Last written: 03/10/15 #90 w/ 0RF Please advise. Thanks.

## 2015-06-09 NOTE — Telephone Encounter (Signed)
Left message stating that rx is ready for p/u.

## 2015-06-16 ENCOUNTER — Encounter: Payer: Self-pay | Admitting: Cardiology

## 2015-06-16 ENCOUNTER — Ambulatory Visit (INDEPENDENT_AMBULATORY_CARE_PROVIDER_SITE_OTHER): Payer: Medicare Other | Admitting: Cardiology

## 2015-06-16 VITALS — BP 137/76 | HR 70 | Ht 65.0 in | Wt 192.9 lb

## 2015-06-16 DIAGNOSIS — I2510 Atherosclerotic heart disease of native coronary artery without angina pectoris: Secondary | ICD-10-CM | POA: Diagnosis not present

## 2015-06-16 DIAGNOSIS — I200 Unstable angina: Secondary | ICD-10-CM | POA: Diagnosis not present

## 2015-06-16 NOTE — Patient Instructions (Signed)
Your physician wants you to follow-up in: 6 months or sooner if needed with Dr. Theresa Duty will receive a reminder letter in the mail two months in advance. If you don't receive a letter, please call our office to schedule the follow-up appointment.

## 2015-06-16 NOTE — Progress Notes (Signed)
06/16/2015 [place]   1955-11-26  [id number]  Primary Physician Tammi Sou, MD Primary Cardiologist: Dr. Debara Pickett Electrophysiologist: N/A  Reason for Visit/CC: [facility] F/U for Chest Pain/ nonobstructive CAD  HPI:  The patient is a 59 year old female followed by Dr. Debara Pickett who presents to clinic today for post hospital follow-up after undergoing a cardiac catheterization to evaluate for chest pain. The procedure was performed by Dr. Debara Pickett 05/16/2015. This revealed only mild proximal to mid LAD stenosis of only 40%. There was also a 30% distal LAD lesion. Continued medical therapy was recommended. Dr. Debara Pickett also decided to add 75 mg of Plavix daily as well as 30 mg of Imdur. She was continued on atenolol and Crestor.  She presents back to clinic today for f/u. She states that she has done well. She denies any recurrent CP and no dyspnea. She denies and symptoms with exertion. Her right radial cath assess site has remained stable. She reports full medication compliance. No abnormal bleeding with Plavix.      Current Outpatient Prescriptions  Medication Sig Dispense Refill  . acetaminophen (TYLENOL) 500 MG tablet Take 500 mg by mouth every 6 (six) hours as needed (pain).    Marland Kitchen albuterol (PROVENTIL HFA;VENTOLIN HFA) 108 (90 BASE) MCG/ACT inhaler Inhale into the lungs every 4 (four) hours as needed for wheezing or shortness of breath (bronchitis).    Marland Kitchen albuterol (PROVENTIL) (2.5 MG/3ML) 0.083% nebulizer solution Take 3 mLs (2.5 mg total) by nebulization every 4 (four) hours as needed for wheezing or shortness of breath. (Patient taking differently: Take 2.5 mg by nebulization every 4 (four) hours as needed for wheezing or shortness of breath (bronchitis). ) 75 mL 1  . atenolol (TENORMIN) 25 MG tablet Take 1 tablet (25 mg total) by mouth 2 (two) times daily. 60 tablet 6  . clopidogrel (PLAVIX) 75 MG tablet Take 1 tablet (75 mg total) by mouth daily. 90 tablet 3  . cyclobenzaprine  (FLEXERIL) 10 MG tablet take 1 tablet by mouth every 8 hours if needed (Patient taking differently: Take 10 mg by mouth every 8 (eight) hours as needed for muscle spasms. ) 30 tablet 5  . fenofibrate 160 MG tablet take 1 tablet by mouth once daily with food (Patient taking differently: Take 160 mg by mouth daily with breakfast. t) 30 tablet 6  . HYDROcodone-acetaminophen (NORCO/VICODIN) 5-325 MG per tablet take 1 tablet by mouth every 6 hours if needed for pain 90 tablet 0  . isosorbide mononitrate (IMDUR) 30 MG 24 hr tablet Take 1 tablet (30 mg total) by mouth daily. 90 tablet 3  . LORazepam (ATIVAN) 1 MG tablet Take 1-2 tablets (1-2 mg total) by mouth daily as needed for anxiety. 30 tablet 5  . nitroGLYCERIN (NITROSTAT) 0.4 MG SL tablet Place 1 tablet (0.4 mg total) under the tongue every 5 (five) minutes as needed for chest pain (MAX 3 doses.). 25 tablet 3  . pantoprazole (PROTONIX) 40 MG tablet Take 1 tablet (40 mg total) by mouth daily. 30 tablet 11  . rosuvastatin (CRESTOR) 20 MG tablet Take 1 tablet (20 mg total) by mouth daily. 30 tablet 6   No current facility-administered medications for this visit.    Allergies  Allergen Reactions  . Aspirin Other (See Comments)     GI Bleed  . Beta Adrenergic Blockers Other (See Comments)    REACTION: decreased libido  . Nsaids Other (See Comments)     GI Upset  . Penicillins Swelling  . Prochlorperazine  Edisylate Other (See Comments)     Stroke like symptoms  . Sulfonamide Derivatives Other (See Comments)    Unknown allergic reaction  . Amitriptyline Hcl Palpitations     increased heart rate    Social History   Social History  . Marital Status: Married    Spouse Name: N/A  . Number of Children: N/A  . Years of Education: N/A   Occupational History  . Not on file.   Social History Main Topics  . Smoking status: Former Smoker -- 1.00 packs/day for 35 years    Types: Cigarettes    Quit date: 07/22/2014  . Smokeless tobacco: Never  Used     Comment: down to ~2 cigarettes/daily (06/23/14) - Quit Smoking around 07/20/14!  Marland Kitchen Alcohol Use: No  . Drug Use: No  . Sexual Activity:    Partners: Male    Birth Control/ Protection: Surgical   Other Topics Concern  . Not on file   Social History Narrative     Review of Systems: General: negative for chills, fever, night sweats or weight changes.  Cardiovascular: negative for chest pain, dyspnea on exertion, edema, orthopnea, palpitations, paroxysmal nocturnal dyspnea or shortness of breath Dermatological: negative for rash Respiratory: negative for cough or wheezing Urologic: negative for hematuria Abdominal: negative for nausea, vomiting, diarrhea, bright red blood per rectum, melena, or hematemesis Neurologic: negative for visual changes, syncope, or dizziness All other systems reviewed and are otherwise negative except as noted above.    Blood pressure 137/76, pulse 70, height 5\' 5"  (1.651 m), weight 192 lb 14.4 oz (87.499 kg).  General appearance: alert, cooperative and no distress Neck: no carotid bruit and no JVD Lungs: clear to auscultation bilaterally Heart: regular rate and rhythm, S1, S2 normal, no murmur, click, rub or gallop Extremities: no LEE Pulses: 2+ and symmetric Skin: warm and dry Neurologic: Grossly normal  EKG Not performed  ASSESSMENT AND PLAN:   1. CAD: nonobstructive CAD on recent cath with 40% proximal to mid LAD stenosis and only 30% distal stenosis. On medical therapy with Plavix, BB, LA nitrate and statin. She denies any further chest pain.  2. HTN: BP well controlled.  3. HLD: on statin therapy with Crestor   PLAN  F/u with Dr. Debara Pickett in 6 months.   Lyda Jester PA-C 06/16/2015 2:03 PM

## 2015-07-14 ENCOUNTER — Encounter: Payer: Self-pay | Admitting: Family Medicine

## 2015-07-14 ENCOUNTER — Ambulatory Visit (INDEPENDENT_AMBULATORY_CARE_PROVIDER_SITE_OTHER): Payer: Medicare Other | Admitting: Family Medicine

## 2015-07-14 VITALS — BP 133/84 | HR 72 | Temp 98.8°F | Resp 16 | Ht 65.0 in | Wt 194.0 lb

## 2015-07-14 DIAGNOSIS — E785 Hyperlipidemia, unspecified: Secondary | ICD-10-CM | POA: Diagnosis not present

## 2015-07-14 DIAGNOSIS — Z23 Encounter for immunization: Secondary | ICD-10-CM

## 2015-07-14 DIAGNOSIS — J438 Other emphysema: Secondary | ICD-10-CM

## 2015-07-14 DIAGNOSIS — I200 Unstable angina: Secondary | ICD-10-CM

## 2015-07-14 DIAGNOSIS — G8929 Other chronic pain: Secondary | ICD-10-CM

## 2015-07-14 DIAGNOSIS — F411 Generalized anxiety disorder: Secondary | ICD-10-CM

## 2015-07-14 DIAGNOSIS — M549 Dorsalgia, unspecified: Secondary | ICD-10-CM

## 2015-07-14 MED ORDER — LORAZEPAM 1 MG PO TABS: 1.0000 mg | ORAL_TABLET | Freq: Two times a day (BID) | ORAL | Status: DC

## 2015-07-14 MED ORDER — HYDROCODONE-ACETAMINOPHEN 5-325 MG PO TABS: ORAL_TABLET | ORAL | Status: DC

## 2015-07-14 MED ORDER — CYCLOBENZAPRINE HCL 10 MG PO TABS: ORAL_TABLET | ORAL | Status: DC

## 2015-07-14 MED ORDER — CLOPIDOGREL BISULFATE 75 MG PO TABS: 75.0000 mg | ORAL_TABLET | Freq: Every day | ORAL | Status: DC

## 2015-07-14 NOTE — Progress Notes (Signed)
OFFICE VISIT  07/17/2015   CC:  Chief Complaint  Patient presents with  . Follow-up    Pt is fasting.   HPI:    Patient is a 59 y.o. Caucasian female who presents for 4 mo f/u COPD, HLD, chronic LBP requiring narcotic pain med for functioning.  Since I last saw her she has had another cardiac cath (05/16/15) which showed mild nonobstructive CAD.  Plavix and imdur were added to her regimen (Dr. Debara Pickett).  Not having any chest pain anymore. Breathing feels good.  Not requiring albuterol inhaler any lately.  She quit smoking a year ago.  Anxiety a bit worse last few months, brother dx'd with stage 4 cancer.  She takes 1mg  ativan tab bid most days now, instead of just 1 per day like she used to.  Chronic LBP seems worse lately as well, radiates down legs at times, gradually worsening over last few months, esp last 2 wks.   Takes vicodin 5/325, 1 tab tid as per her usual.  She does not want to go see her back surgeon at this time.    Past Medical History  Diagnosis Date  . Chronic LBP   . DDD (degenerative disc disease)     spinal stenosis  . Hyperlipidemia     mixed  . History of GI bleed     NSAIDS  . Recurrent UTI   . Microscopic hematuria     full w/ u unrevealing X 2  . Tobacco dependence in remission     Quit fall 2015.  Only occasional bronchitis illness (CT shest 2009 nl- for f/u of ? nodule on CXR)  . Atypical chest pain 2007; 2015    cardiolyte neg, echo nl, cath showed mild/nonobstructive LAD disease  . Normal nuclear stress test 11/11 and 06/2014    negative, EF normal  . Palpitations 2006    48H holter neg  . Osteoarthritis of knee 06/30/2012    Right   . COPD (chronic obstructive pulmonary disease)   . Fatty liver     Past Surgical History  Procedure Laterality Date  . Lumbar spine surgery  1993    right iliac crest bone graft+metal instrumentation; 2005 metal removal and decompression, 2011 lumbar decompression 4-11, then stabilization/ instrumentation done  09-19-10: L2,L3, L5 left and L2 , L3, L4 right pedical remnant L4 left embedded. Left iliac crest bone graft-- Dr Velna Ochs  . Appendectomy  1984  . Abdominal hysterectomy  1997    DUB  . Oophorectomy Right     cyst  . Tonsillectomy  59 yrs old  . Knee arthroscopy Bilateral   . Left wrist ganglion cyst excision  2010  . Tonsillectomy    . Cardiac catheterization  10/09/2005    no CAD, mildly elevated LVEDP, normal LV function (Dr. Gerrie Nordmann)  . Cardiovascular stress test  06/2014    normal lexiscan NST  . Transthoracic echocardiogram  2006    EF=>55%, trace MR, mild TR, trace AV regurg, trace pulm valve regurg   . Cardiovascular stress test  2006    persantine - no ischemia, low risk   . Cardiac catheterization N/A 05/16/2015    Mild non-obstructive CAD--med mgmt.  Procedure: Left Heart Cath and Coronary Angiography;  Surgeon: Pixie Casino, MD;  Location: [facility];  Service: Cardiovascular;  Laterality: N/A;    Outpatient Prescriptions Prior to Visit  Medication Sig Dispense Refill  . acetaminophen (TYLENOL) 500 MG tablet Take 500 mg by mouth every 6 (  six) hours as needed (pain).    Marland Kitchen albuterol (PROVENTIL HFA;VENTOLIN HFA) 108 (90 BASE) MCG/ACT inhaler Inhale into the lungs every 4 (four) hours as needed for wheezing or shortness of breath (bronchitis).    Marland Kitchen albuterol (PROVENTIL) (2.5 MG/3ML) 0.083% nebulizer solution Take 3 mLs (2.5 mg total) by nebulization every 4 (four) hours as needed for wheezing or shortness of breath. (Patient taking differently: Take 2.5 mg by nebulization every 4 (four) hours as needed for wheezing or shortness of breath (bronchitis). ) 75 mL 1  . atenolol (TENORMIN) 25 MG tablet Take 1 tablet (25 mg total) by mouth 2 (two) times daily. 60 tablet 6  . fenofibrate 160 MG tablet take 1 tablet by mouth once daily with food (Patient taking differently: Take 160 mg by mouth daily with breakfast. t) 30 tablet 6  . isosorbide mononitrate (IMDUR) 30 MG 24 hr  tablet Take 1 tablet (30 mg total) by mouth daily. 90 tablet 3  . nitroGLYCERIN (NITROSTAT) 0.4 MG SL tablet Place 1 tablet (0.4 mg total) under the tongue every 5 (five) minutes as needed for chest pain (MAX 3 doses.). 25 tablet 3  . pantoprazole (PROTONIX) 40 MG tablet Take 1 tablet (40 mg total) by mouth daily. 30 tablet 11  . rosuvastatin (CRESTOR) 20 MG tablet Take 1 tablet (20 mg total) by mouth daily. 30 tablet 6  . clopidogrel (PLAVIX) 75 MG tablet Take 1 tablet (75 mg total) by mouth daily. 90 tablet 3  . cyclobenzaprine (FLEXERIL) 10 MG tablet take 1 tablet by mouth every 8 hours if needed (Patient taking differently: Take 10 mg by mouth every 8 (eight) hours as needed for muscle spasms. ) 30 tablet 5  . HYDROcodone-acetaminophen (NORCO/VICODIN) 5-325 MG per tablet take 1 tablet by mouth every 6 hours if needed for pain 90 tablet 0  . LORazepam (ATIVAN) 1 MG tablet Take 1-2 tablets (1-2 mg total) by mouth daily as needed for anxiety. 30 tablet 5   No facility-administered medications prior to visit.    Allergies  Allergen Reactions  . Aspirin Other (See Comments)     GI Bleed  . Beta Adrenergic Blockers Other (See Comments)    REACTION: decreased libido  . Nsaids Other (See Comments)     GI Upset  . Penicillins Swelling  . Prochlorperazine Edisylate Other (See Comments)     Stroke like symptoms  . Sulfonamide Derivatives Other (See Comments)    Unknown allergic reaction  . Amitriptyline Hcl Palpitations     increased heart rate    ROS As per HPI  PE: Blood pressure 133/84, pulse 72, temperature 98.8 F (37.1 C), temperature source Oral, resp. rate 16, height 5\' 5"  (1.651 m), weight 194 lb (87.998 kg), SpO2 93 %. Gen: Alert, well appearing.  Patient is oriented to person, place, time, and situation. AFFECT: pleasant, lucid thought and speech. No further exam today.  LABS:  Lab Results  Component Value Date   HGBA1C 6.0 11/11/2014     Chemistry      Component  Value Date/Time   NA 142 05/10/2015 1420   K 4.0 05/10/2015 1420   CL 110 05/10/2015 1420   CO2 28 05/10/2015 1420   BUN 16 05/10/2015 1420   CREATININE 0.74 05/10/2015 1420   CREATININE 0.69 06/29/2011 1607      Component Value Date/Time   CALCIUM 8.9 05/10/2015 1420   ALKPHOS 51 03/10/2015 1443   AST 21 03/10/2015 1443   ALT 22 03/10/2015 1443  BILITOT 0.6 03/10/2015 1443     Lab Results  Component Value Date   WBC 8.3 05/10/2015   HGB 12.8 05/10/2015   HCT 38.1 05/10/2015   MCV 92.0 05/10/2015   PLT 203 05/10/2015   Lab Results  Component Value Date   TSH 0.807 05/10/2015   Lab Results  Component Value Date   CHOL 154 03/10/2015   HDL 47.80 03/10/2015   LDLCALC 83 03/10/2015   LDLDIRECT 172.2 07/14/2014   TRIG 114.0 03/10/2015   CHOLHDL 3 03/10/2015   IMPRESSION AND PLAN:  1) COPD: stable/doing much better since quitting smoking 1 yr ago.  Cont prn albut.  2) HLD: tolerating statin and fenofibrate.  Not fasting today. Most recent [place] 03/2015 was great.  AST/ALT normal 03/2015.  3) Anxiety: increased as of last 1/2 year or so.  Will increase monthly lorazepam to #60 to allow for 1 bid as her usual dose.  Therapeutic expectations and side effect profile of medication discussed today.  Patient's questions answered.  4) Chronic LBP requiring narcotic pain med for functioning: pain flaring more lately but pt does not want to pursue a f/u visit with her specialist b/c she is fearful of any further procedures or surgery needed.  I'll continue to treat her pain in good faith.  She continues to use same amount of vicodin 5/325, 1 tid, #90.  I printed rx's for Vicodin  Oct, Nov, and Dec 2016 today for.  Appropriate fill on/after date was noted on each rx.  An After Visit Summary was printed and given to the patient.  FOLLOW UP: Return in about 4 months (around 11/13/2015).

## 2015-07-14 NOTE — Progress Notes (Signed)
Pre visit review using our clinic review tool, if applicable. No additional management support is needed unless otherwise documented below in the visit note. 

## 2015-08-24 ENCOUNTER — Telehealth: Payer: Self-pay | Admitting: *Deleted

## 2015-08-24 ENCOUNTER — Encounter: Payer: Self-pay | Admitting: *Deleted

## 2015-08-24 NOTE — Telephone Encounter (Signed)
Faxed info r/t lab orders to [facility] medical records Attn: Jerene Pitch

## 2015-09-13 ENCOUNTER — Other Ambulatory Visit: Payer: Self-pay | Admitting: *Deleted

## 2015-09-13 MED ORDER — ROSUVASTATIN CALCIUM 20 MG PO TABS: 20.0000 mg | ORAL_TABLET | Freq: Every day | ORAL | Status: DC

## 2015-09-13 NOTE — Telephone Encounter (Signed)
RF request for rosuvastatin LOV: 07/14/15 Next ov: 11/14/15 Last written: 07/20/14 #30 w/ 6RF

## 2015-10-12 ENCOUNTER — Encounter: Payer: Self-pay | Admitting: Family Medicine

## 2015-10-12 ENCOUNTER — Ambulatory Visit (INDEPENDENT_AMBULATORY_CARE_PROVIDER_SITE_OTHER): Payer: Medicare Other | Admitting: Family Medicine

## 2015-10-12 ENCOUNTER — Ambulatory Visit (HOSPITAL_COMMUNITY)
Admission: RE | Admit: 2015-10-12 | Discharge: 2015-10-12 | Disposition: A | Discharge status: Home / Self Care | Payer: Medicare Other | Source: Ambulatory Visit | Attending: Family Medicine | Admitting: Family Medicine

## 2015-10-12 VITALS — BP 134/90 | HR 95 | Temp 98.1°F | Resp 16 | Ht 65.0 in | Wt 196.2 lb

## 2015-10-12 DIAGNOSIS — S39012A Strain of muscle, fascia and tendon of lower back, initial encounter: Secondary | ICD-10-CM | POA: Diagnosis not present

## 2015-10-12 DIAGNOSIS — M5136 Other intervertebral disc degeneration, lumbar region: Secondary | ICD-10-CM | POA: Diagnosis not present

## 2015-10-12 DIAGNOSIS — I200 Unstable angina: Secondary | ICD-10-CM

## 2015-10-12 DIAGNOSIS — M549 Dorsalgia, unspecified: Secondary | ICD-10-CM | POA: Diagnosis not present

## 2015-10-12 DIAGNOSIS — M545 Low back pain, unspecified: Secondary | ICD-10-CM | POA: Insufficient documentation

## 2015-10-12 DIAGNOSIS — G8929 Other chronic pain: Secondary | ICD-10-CM

## 2015-10-12 DIAGNOSIS — Z981 Arthrodesis status: Secondary | ICD-10-CM | POA: Diagnosis not present

## 2015-10-12 MED ORDER — OXYCODONE-ACETAMINOPHEN 10-325 MG PO TABS: ORAL_TABLET | ORAL | Status: DC

## 2015-10-12 MED ORDER — HYDROCODONE-ACETAMINOPHEN 5-325 MG PO TABS: ORAL_TABLET | ORAL | Status: DC

## 2015-10-12 NOTE — Progress Notes (Signed)
Pre visit review using our clinic review tool, if applicable. No additional management support is needed unless otherwise documented below in the visit note. 

## 2015-10-12 NOTE — Progress Notes (Signed)
OFFICE VISIT  10/12/2015   CC:  Chief Complaint  Patient presents with  . Back Pain    x 4 days, stated that she twisted the wrong way at home and now her back has been "killing" her   HPI:    Patient is a 59 y.o. Caucasian female who presents for acute worsening of her chronic LBP. Onset 5d ago when bending over doing laundry, twisted it to sit on bed and had acute onset of pain in entire distal T spine and all of L/S spine area.  Pain is constant and severe now in L/S area diffusely, worse when she moves around.  The pain extends into bilat glut and upper hamstring areas but no significant radicular pain.  Off and on has brief tingling along lateral gluts/thighs.  Mild urinary urgency lately but has good control of bowels.  No dysuria, hematuria, or urinary frequency.  Legs intermittently feel weak--both--but she feels like this comes when her pain is most severe.  No saddle anesthesia.  Past Medical History  Diagnosis Date  . Chronic LBP   . DDD (degenerative disc disease)     spinal stenosis  . Hyperlipidemia     mixed  . History of GI bleed     NSAIDS  . Recurrent UTI   . Microscopic hematuria     full w/ u unrevealing X 2  . Tobacco dependence in remission     Quit fall 2015.  Only occasional bronchitis illness (CT shest 2009 nl- for f/u of ? nodule on CXR)  . Atypical chest pain 2007; 2015    cardiolyte neg, echo nl, cath showed mild/nonobstructive LAD disease  . Normal nuclear stress test 11/11 and 06/2014    negative, EF normal  . Palpitations 2006    48H holter neg  . Osteoarthritis of knee 06/30/2012    Right   . COPD (chronic obstructive pulmonary disease) ([place])   . Fatty liver     Past Surgical History  Procedure Laterality Date  . Lumbar spine surgery  1993    right iliac crest bone graft+metal instrumentation; 2005 metal removal and decompression, 2011 lumbar decompression 4-11, then stabilization/ instrumentation done 09-19-10: L2,L3, L5 left and L2 , L3, L4  right pedical remnant L4 left embedded. Left iliac crest bone graft-- Dr Velna Ochs  . Appendectomy  1984  . Abdominal hysterectomy  1997    DUB  . Oophorectomy Right     cyst  . Tonsillectomy  59 yrs old  . Knee arthroscopy Bilateral   . Left wrist ganglion cyst excision  2010  . Tonsillectomy    . Cardiac catheterization  10/09/2005    no CAD, mildly elevated LVEDP, normal LV function (Dr. Gerrie Nordmann)  . Cardiovascular stress test  06/2014    normal lexiscan NST  . Transthoracic echocardiogram  2006    EF=>55%, trace MR, mild TR, trace AV regurg, trace pulm valve regurg   . Cardiovascular stress test  2006    persantine - no ischemia, low risk   . Cardiac catheterization N/A 05/16/2015    Mild non-obstructive CAD--med mgmt.  Procedure: Left Heart Cath and Coronary Angiography;  Surgeon: Pixie Casino, MD;  Location: [facility];  Service: Cardiovascular;  Laterality: N/A;    Outpatient Prescriptions Prior to Visit  Medication Sig Dispense Refill  . acetaminophen (TYLENOL) 500 MG tablet Take 500 mg by mouth every 6 (six) hours as needed (pain).    Marland Kitchen albuterol (PROVENTIL HFA;VENTOLIN HFA) 108 (90  BASE) MCG/ACT inhaler Inhale into the lungs every 4 (four) hours as needed for wheezing or shortness of breath (bronchitis).    Marland Kitchen albuterol (PROVENTIL) (2.5 MG/3ML) 0.083% nebulizer solution Take 3 mLs (2.5 mg total) by nebulization every 4 (four) hours as needed for wheezing or shortness of breath. (Patient taking differently: Take 2.5 mg by nebulization every 4 (four) hours as needed for wheezing or shortness of breath (bronchitis). ) 75 mL 1  . atenolol (TENORMIN) 25 MG tablet Take 1 tablet (25 mg total) by mouth 2 (two) times daily. 60 tablet 6  . clopidogrel (PLAVIX) 75 MG tablet Take 1 tablet (75 mg total) by mouth daily. 90 tablet 3  . cyclobenzaprine (FLEXERIL) 10 MG tablet take 1 tablet by mouth every 8 hours if needed 30 tablet 5  . fenofibrate 160 MG tablet take 1 tablet by mouth  once daily with food (Patient taking differently: Take 160 mg by mouth daily with breakfast. t) 30 tablet 6  . isosorbide mononitrate (IMDUR) 30 MG 24 hr tablet Take 1 tablet (30 mg total) by mouth daily. 90 tablet 3  . LORazepam (ATIVAN) 1 MG tablet Take 1 tablet (1 mg total) by mouth 2 (two) times daily. 60 tablet 5  . nitroGLYCERIN (NITROSTAT) 0.4 MG SL tablet Place 1 tablet (0.4 mg total) under the tongue every 5 (five) minutes as needed for chest pain (MAX 3 doses.). 25 tablet 3  . pantoprazole (PROTONIX) 40 MG tablet Take 1 tablet (40 mg total) by mouth daily. 30 tablet 11  . rosuvastatin (CRESTOR) 20 MG tablet Take 1 tablet (20 mg total) by mouth daily. 30 tablet 11  . HYDROcodone-acetaminophen (NORCO/VICODIN) 5-325 MG per tablet take 1 tablet by mouth every 6 hours if needed for pain 90 tablet 0   No facility-administered medications prior to visit.    Allergies  Allergen Reactions  . Aspirin Other (See Comments)     GI Bleed  . Beta Adrenergic Blockers Other (See Comments)    REACTION: decreased libido  . Nsaids Other (See Comments)     GI Upset  . Penicillins Swelling  . Prochlorperazine Edisylate Other (See Comments)     Stroke like symptoms  . Sulfonamide Derivatives Other (See Comments)    Unknown allergic reaction  . Amitriptyline Hcl Palpitations     increased heart rate    ROS As per HPI  PE: Blood pressure 134/90, pulse 95, temperature 98.1 F (36.7 C), temperature source Oral, resp. rate 16, height 5\' 5"  (1.651 m), weight 196 lb 4 oz (89.018 kg), SpO2 96 %. Gen: alert, oriented x 4, lucid thought and speech. Moderately distressed from pain, +crying. L spine ROM very limited due to pain in all motions. TTP diffusely in lower L spine and in SI joint regions bilat. Muscles in L spine are tense. LE strength 5/5 prox/dist bilat, patellarDTRs 2+ bilat, achilles dTRs 1+ bilat. Sitting SLR neg bilat.  LABS:  none  IMPRESSION AND PLAN:  1) Acute low back  strain, superimposed on chronic LB pain/DDD. Change pain med to percocet 10/325, 1/2-1 q6h prn for the short term for more severe pain like she has now (#60 rx'd today) and then revert to vicodin (I gave a rx for 5/325, #90 with appropriate fill on/after date on it today) for more moderate/mild pain as she has been doing chronically. Continue flexeril q8h prn, heat, gentle ROM as tolerated. Will check L spine plain films and will refer to [facility] PT.  An After Visit Summary  was printed and given to the patient.   FOLLOW UP: Return in about 6 weeks (around 11/23/2015) for f/u back strain.

## 2015-11-02 ENCOUNTER — Ambulatory Visit (HOSPITAL_COMMUNITY): Payer: Medicare Other

## 2015-11-03 ENCOUNTER — Ambulatory Visit (HOSPITAL_COMMUNITY): Payer: Medicare Other | Attending: Family Medicine | Admitting: Physical Therapy

## 2015-11-03 DIAGNOSIS — R269 Unspecified abnormalities of gait and mobility: Secondary | ICD-10-CM | POA: Diagnosis not present

## 2015-11-03 DIAGNOSIS — M5410 Radiculopathy, site unspecified: Secondary | ICD-10-CM | POA: Insufficient documentation

## 2015-11-03 DIAGNOSIS — M5416 Radiculopathy, lumbar region: Secondary | ICD-10-CM

## 2015-11-03 DIAGNOSIS — R29898 Other symptoms and signs involving the musculoskeletal system: Secondary | ICD-10-CM

## 2015-11-03 DIAGNOSIS — G8929 Other chronic pain: Secondary | ICD-10-CM | POA: Diagnosis not present

## 2015-11-03 DIAGNOSIS — M2560 Stiffness of unspecified joint, not elsewhere classified: Secondary | ICD-10-CM

## 2015-11-03 NOTE — Therapy (Signed)
[place], [place], [address] Phone: [phone]   Fax:  [phone]  Physical Therapy Evaluation  Patient Details  Name: Brenda [name] MRN: [id number] Date of Birth: 11/28/1955 Referring Provider: Anitra Lauth  Encounter Date: 11/03/2015      PT End of Session - 11/03/15 1507    Visit Number 1   Number of Visits 8   Date for PT Re-Evaluation 12/03/15   Authorization Type medicare/caid   PT Start Time 1435   PT Stop Time 1513   PT Time Calculation (min) 38 min   Activity Tolerance Patient limited by pain   Behavior During Therapy [facility] for tasks assessed/performed      Past Medical History  Diagnosis Date  . Chronic LBP   . DDD (degenerative disc disease)     spinal stenosis  . Hyperlipidemia     mixed  . History of GI bleed     NSAIDS  . Recurrent UTI   . Microscopic hematuria     full w/ u unrevealing X 2  . Tobacco dependence in remission     Quit fall 2015.  Only occasional bronchitis illness (CT shest 2009 nl- for f/u of ? nodule on CXR)  . Atypical chest pain 2007; 2015    cardiolyte neg, echo nl, cath showed mild/nonobstructive LAD disease  . Normal nuclear stress test 11/11 and 06/2014    negative, EF normal  . Palpitations 2006    48H holter neg  . Osteoarthritis of knee 06/30/2012    Right   . COPD (chronic obstructive pulmonary disease) ([place])   . Fatty liver     Past Surgical History  Procedure Laterality Date  . Lumbar spine surgery  1993    right iliac crest bone graft+metal instrumentation; 2005 metal removal and decompression, 2011 lumbar decompression 4-11, then stabilization/ instrumentation done 09-19-10: L2,L3, L5 left and L2 , L3, L4 right pedical remnant L4 left embedded. Left iliac crest bone graft-- Dr Velna Ochs  . Appendectomy  1984  . Abdominal hysterectomy  1997    DUB  . Oophorectomy Right     cyst  . Tonsillectomy  59 yrs old  . Knee arthroscopy Bilateral   . Left wrist  ganglion cyst excision  2010  . Tonsillectomy    . Cardiac catheterization  10/09/2005    no CAD, mildly elevated LVEDP, normal LV function (Dr. Gerrie Nordmann)  . Cardiovascular stress test  06/2014    normal lexiscan NST  . Transthoracic echocardiogram  2006    EF=>55%, trace MR, mild TR, trace AV regurg, trace pulm valve regurg   . Cardiovascular stress test  2006    persantine - no ischemia, low risk   . Cardiac catheterization N/A 05/16/2015    Mild non-obstructive CAD--med mgmt.  Procedure: Left Heart Cath and Coronary Angiography;  Surgeon: Pixie Casino, MD;  Location: [facility];  Service: Cardiovascular;  Laterality: N/A;    There were no vitals filed for this visit.  Visit Diagnosis:  Chronic radicular low back pain  Bilateral leg weakness  Abnormality of gait  Stiffness of unspecified joint, not elsewhere classified      Subjective Assessment - 11/03/15 1440    Subjective Brenda [name] states that she has had back pain off and on since a workman compensation injury in 1990.  She had a year of physical therapy and ended up with a fusion at two levels.  Several years later she had to  have another level fused.  She states that she has been having pain on and off ever since but four weeks ago she bent down to get laundry and had searing pain down her left leg to her ankle level.  She went to the MD who took x-rays and stated that all the hardware continues to be stable.  She has been refered to PT to decrease her sx of pain.     How long can you sit comfortably? Pt is able to sit for 30 minutes    How long can you stand comfortably? 30 minutes    How long can you walk comfortably? 20 minutes    Currently in Pain? Yes   Pain Score 3   with medication;worst 6/10   Pain Location Back   Pain Orientation Lower   Pain Descriptors / Indicators Aching;Burning;Throbbing   Pain Radiating Towards Lt ankle            [facility] PT Assessment - 11/03/15 0001    Assessment    Medical Diagnosis DDD   Referring Provider [place]   Onset Date/Surgical Date 09/01/10   Next MD Visit 11/12/2015   Prior Therapy yes after surgeries.   Precautions   Precautions None   Restrictions   Weight Bearing Restrictions No   Balance Screen   Has the patient fallen in the past 6 months No   Has the patient had a decrease in activity level because of a fear of falling?  No   Is the patient reluctant to leave their home because of a fear of falling?  No   Prior Function   Level of Independence Independent   Vocation On disability   Leisure none   Cognition   Overall Cognitive Status Within Functional Limits for tasks assessed   Observation/Other Assessments   Focus on Therapeutic Outcomes (FOTO)  46   Posture/Postural Control   Posture/Postural Control Postural limitations   Postural Limitations Decreased lumbar lordosis;Decreased thoracic kyphosis;Posterior pelvic tilt;Flexed trunk   Posture Comments stands with 15 degree forward bend    ROM / Strength   AROM / PROM / Strength AROM;Strength   AROM   AROM Assessment Site Lumbar   Lumbar Flexion 50   Lumbar Extension 8   Lumbar - Right Side Bend 10   Lumbar - Left Side Bend 9   Strength   Strength Assessment Site Hip;Knee;Ankle   Right/Left Hip Right;Left   Right Hip Flexion 3-/5   Right Hip Extension 3-/5   Right Hip ABduction 4-/5   Left Hip Flexion 3+/5   Left Hip Extension 3-/5   Left Hip ABduction 3+/5   Right/Left Knee Right;Left   Right Knee Flexion 3+/5   Right Knee Extension 3/5   Left Knee Flexion 3+/5   Left Knee Extension 3/5   Right/Left Ankle Right;Left   Right Ankle Dorsiflexion 3+/5   Left Ankle Dorsiflexion 3+/5                   [facility] Adult PT Treatment/Exercise - 11/03/15 0001    Exercises   Exercises Lumbar   Lumbar Exercises: Stretches   Active Hamstring Stretch 3 reps;30 seconds   Single Knee to Chest Stretch 3 reps;30 seconds   Lower Trunk Rotation 5 reps   Piriformis  Stretch 3 reps;30 seconds                PT Education - 11/03/15 1506    Education provided Yes   Education Details HEP for stretching  Person(s) Educated Patient   Methods Explanation;Verbal cues;Handout   Comprehension Verbalized understanding;Returned demonstration          PT Short Term Goals - Nov 18, 2015 1614    PT SHORT TERM GOAL #1   Title I in HEP to assist in decreasing pain to no greater than a 5 for improved quality of life.   Time 2   Period Weeks   PT SHORT TERM GOAL #2   Title Pt radicular sx to be no futher than her knee to demonstrate decreased tension on nerve.   Time 2   Period Weeks   PT SHORT TERM GOAL #3   Title Pt to be able to sit for an hour to travel without increased disconfort    Time 2   Period Weeks           PT Long Term Goals - November 18, 2015 1616    PT LONG TERM GOAL #1   Title I in advanced HEP in order to decrease pain no greater than a 3/10 80% of the time for improved quality of life.   Time 4   Period Weeks   PT LONG TERM GOAL #2   Title PT to have increased leg/core strength by one grade to be able to bring a bag of groceies into the house without increased pain   Time 4   Period Weeks   PT LONG TERM GOAL #3   Title Pt to be able to sit for 2 hours without inceased discomfort for traveling/ going to a movie.   PT LONG TERM GOAL #4   Title Pt to be able to walk for 40 minutes for improved healthy lifesyle.   Time 4   Period Weeks               Plan - November 18, 2015 1508    Clinical Impression Statement Pt is a 59 yo female with chronic back pain status post two back surgeries who has had an acute exacerbation of pain.  She is curently having radicula pain down to her ankle level.  She has been referred to skilled PT.  Examination demonstates poor posture, decerased ROM, decreased strength and decreased activity tolerance. Brenda [name] will benefit from skilled PT to insturce in a lumbar stabilization program to improve  functional activity tolerance and decrease pain.    Pt will benefit from skilled therapeutic intervention in order to improve on the following deficits Decreased activity tolerance;Decreased range of motion;Decreased strength;Difficulty walking;Hypomobility;Pain   Rehab Potential Fair   PT Frequency 2x / week   PT Duration 4 weeks   PT Treatment/Interventions Therapeutic activities;Functional mobility training;Therapeutic exercise;Patient/family education   PT Next Visit Plan begin clam, bent knee lift , bridging SLR and sidelying abduction give for HEP    PT Home Exercise Plan given    Consulted and Agree with Plan of Care Patient          G-Codes - 2015-11-18 1622    Functional Assessment Tool Used foto   Functional Limitation Changing and maintaining body position   Changing and Maintaining Body Position Current Status [id number]) At least 40 percent but less than 60 percent impaired, limited or restricted   Changing and Maintaining Body Position Goal Status [id number]) At least 40 percent but less than 60 percent impaired, limited or restricted       Problem List Patient Active Problem List   Diagnosis Date Noted  . Chronic low back pain 10/12/2015  . Unstable angina ([place]) 05/04/2015  . CAD (  coronary artery disease) 06/25/2014  . Chest pain 06/25/2014  . Paresthesia of both feet 03/11/2014  . Health maintenance examination 11/12/2013  . Acute exacerbation of chronic obstructive pulmonary disease (COPD) ([place]) 08/13/2013  . CAP (community acquired pneumonia) 08/13/2013  . Arthritis of knee, degenerative 04/10/2013  . H/O Spinal surgery 03/11/2013  . Degenerative disc disease, lumbar 03/11/2013  . Degenerative arthritis of right knee 03/11/2013  . Anxiety state 03/03/2013  . COPD (chronic obstructive pulmonary disease) ([place]) 03/03/2013  . Osteoarthritis of knee 06/30/2012  . Right knee pain 06/26/2012  . Colon cancer screening 02/21/2012  . Breast cancer screening 02/21/2012  .  Prediabetes 11/09/2010  . Hyperlipidemia 11/09/2010  . TOBACCO ABUSE 11/09/2010  . BACK PAIN, LUMBAR, CHRONIC 11/09/2010  . HEMATURIA, MICROSCOPIC, HX OF 11/09/2010    Rayetta Humphrey, PT CLT [phone] 11/03/2015, 4:24 PM  [facility] [address] [place], [place], [address] Phone: [phone]   Fax:  [phone]  Name: Brenda [name] MRN: [id number] Date of Birth: May 04, 1956

## 2015-11-03 NOTE — Patient Instructions (Signed)
Backward Bend (Standing)    Arch backward to make hollow of back deeper. Hold __3__ seconds. Repeat __5_ times per set. Do _1___ sets per session. Do 3____ sessions per day.  http://orth.exer.us/178   Copyright  VHI. All rights reserved.  Knee-to-Chest Stretch: Unilateral    With hand behind right knee, pull knee in to chest until a comfortable stretch is felt in lower back and buttocks. Keep back relaxed. Hold _30___ seconds. Repeat ___3_ times per set. Do ____1 sets per session. Do ___2_ sessions per day.  http://orth.exer.us/126   Copyright  VHI. All rights reserved.  Hamstring Stretch: Active    Support behind right knee. Starting with knee bent, attempt to straighten knee until a comfortable stretch is felt in back of thigh. Hold ___30_ seconds. Repeat __3__ times per set. Do 1____ sets per session. Do ___3_ sessions per day.  http://orth.exer.us/158   Copyright  VHI. All rights reserved.  Stretching: Piriformis (Supine)    Pull right knee toward opposite shoulder. Hold _30___ seconds. Relax. Repeat ____3 times per set. Do __1__ sets per session. Do 3____ sessions per day.  http://orth.exer.us/712   Copyright  VHI. All rights reserved.  Isometric Abdominal    Lying on back with knees bent, tighten stomach by pressing elbows down. Hold _4___ seconds. Repeat ___10_ times per set. Do ___1_ sets per session. Do 10____ sessions per day.  http://orth.exer.us/1086   Copyright  VHI. All rights reserved.

## 2015-11-07 ENCOUNTER — Other Ambulatory Visit: Payer: Self-pay | Admitting: Family Medicine

## 2015-11-08 NOTE — Telephone Encounter (Signed)
RF request for cyclobenzaprine LOV: 10/12/15 Next ov: 11/14/15 Last written: 07/14/15 #30 w/ 5RF

## 2015-11-14 ENCOUNTER — Ambulatory Visit (INDEPENDENT_AMBULATORY_CARE_PROVIDER_SITE_OTHER): Payer: Medicare Other | Admitting: Family Medicine

## 2015-11-14 ENCOUNTER — Encounter: Payer: Self-pay | Admitting: Family Medicine

## 2015-11-14 VITALS — BP 149/80 | HR 52 | Temp 98.3°F | Resp 16 | Ht 65.0 in | Wt 192.8 lb

## 2015-11-14 DIAGNOSIS — E785 Hyperlipidemia, unspecified: Secondary | ICD-10-CM

## 2015-11-14 DIAGNOSIS — M549 Dorsalgia, unspecified: Secondary | ICD-10-CM | POA: Diagnosis not present

## 2015-11-14 DIAGNOSIS — G8929 Other chronic pain: Secondary | ICD-10-CM | POA: Diagnosis not present

## 2015-11-14 DIAGNOSIS — M545 Low back pain, unspecified: Secondary | ICD-10-CM

## 2015-11-14 DIAGNOSIS — Z79891 Long term (current) use of opiate analgesic: Secondary | ICD-10-CM | POA: Diagnosis not present

## 2015-11-14 DIAGNOSIS — F419 Anxiety disorder, unspecified: Secondary | ICD-10-CM

## 2015-11-14 DIAGNOSIS — J438 Other emphysema: Secondary | ICD-10-CM

## 2015-11-14 DIAGNOSIS — Z79899 Other long term (current) drug therapy: Secondary | ICD-10-CM | POA: Diagnosis not present

## 2015-11-14 MED ORDER — HYDROCODONE-ACETAMINOPHEN 5-325 MG PO TABS: ORAL_TABLET | ORAL | Status: DC

## 2015-11-14 MED ORDER — FENOFIBRATE 160 MG PO TABS: ORAL_TABLET | ORAL | Status: DC

## 2015-11-14 MED ORDER — PANTOPRAZOLE SODIUM 40 MG PO TBEC: 40.0000 mg | DELAYED_RELEASE_TABLET | Freq: Every day | ORAL | Status: DC

## 2015-11-14 NOTE — Progress Notes (Signed)
Pre visit review using our clinic review tool, if applicable. No additional management support is needed unless otherwise documented below in the visit note. 

## 2015-11-14 NOTE — Progress Notes (Signed)
OFFICE VISIT  11/14/2015   CC:  Chief Complaint  Patient presents with  . Follow-up    Pt is fasting.    HPI:    Patient is a 60 y.o. Caucasian female who presents for 4 mo f/u COPD, HLD, chronic LBP requiring narcotic pain med for functioning.   Acute back pain o/v with me recently: better, started PT, back on vicodin instead of percocet. Takes vicodin three times a day.  Taking flexeril as well.  Took both of these today. Last percocet was 5-6 days ago.  Taking ativan regularly.  Home bp monitoring: 120s/80s avg.  Tolerating/compliant with bp meds.  She remains off of cigs, says her breathing feels good, doesn't have to use albuterol any.  Anxiety; has been on long term benzo scheduled, had to go up on her dose at last routine f/u 4 mo ago: takes loraz 1mg  bid.   Past Medical History  Diagnosis Date  . Chronic LBP   . DDD (degenerative disc disease)     spinal stenosis  . Hyperlipidemia     mixed  . History of GI bleed     NSAIDS  . Recurrent UTI   . Microscopic hematuria     full w/ u unrevealing X 2  . Tobacco dependence in remission     Quit fall 2015.  Only occasional bronchitis illness (CT shest 2009 nl- for f/u of ? nodule on CXR)  . Atypical chest pain 2007; 2015    cardiolyte neg, echo nl, cath showed mild/nonobstructive LAD disease  . Normal nuclear stress test 11/11 and 06/2014    negative, EF normal  . Palpitations 2006    48H holter neg  . Osteoarthritis of knee 06/30/2012    Right   . COPD (chronic obstructive pulmonary disease) ([place])   . Fatty liver     Past Surgical History  Procedure Laterality Date  . Lumbar spine surgery  1993    right iliac crest bone graft+metal instrumentation; 2005 metal removal and decompression, 2011 lumbar decompression 4-11, then stabilization/ instrumentation done 09-19-10: L2,L3, L5 left and L2 , L3, L4 right pedical remnant L4 left embedded. Left iliac crest bone graft-- Dr Velna Ochs  . Appendectomy  1984  . Abdominal  hysterectomy  1997    DUB  . Oophorectomy Right     cyst  . Tonsillectomy  60 yrs old  . Knee arthroscopy Bilateral   . Left wrist ganglion cyst excision  2010  . Tonsillectomy    . Cardiac catheterization  10/09/2005    no CAD, mildly elevated LVEDP, normal LV function (Dr. Gerrie Nordmann)  . Cardiovascular stress test  06/2014    normal lexiscan NST  . Transthoracic echocardiogram  2006    EF=>55%, trace MR, mild TR, trace AV regurg, trace pulm valve regurg   . Cardiovascular stress test  2006    persantine - no ischemia, low risk   . Cardiac catheterization N/A 05/16/2015    Mild non-obstructive CAD--med mgmt.  Procedure: Left Heart Cath and Coronary Angiography;  Surgeon: Pixie Casino, MD;  Location: [facility];  Service: Cardiovascular;  Laterality: N/A;    Outpatient Prescriptions Prior to Visit  Medication Sig Dispense Refill  . acetaminophen (TYLENOL) 500 MG tablet Take 500 mg by mouth every 6 (six) hours as needed (pain).    Marland Kitchen albuterol (PROVENTIL HFA;VENTOLIN HFA) 108 (90 BASE) MCG/ACT inhaler Inhale into the lungs every 4 (four) hours as needed for wheezing or shortness of breath (bronchitis).    Marland Kitchen  albuterol (PROVENTIL) (2.5 MG/3ML) 0.083% nebulizer solution Take 3 mLs (2.5 mg total) by nebulization every 4 (four) hours as needed for wheezing or shortness of breath. (Patient taking differently: Take 2.5 mg by nebulization every 4 (four) hours as needed for wheezing or shortness of breath (bronchitis). ) 75 mL 1  . atenolol (TENORMIN) 25 MG tablet Take 1 tablet (25 mg total) by mouth 2 (two) times daily. 60 tablet 6  . clopidogrel (PLAVIX) 75 MG tablet Take 1 tablet (75 mg total) by mouth daily. 90 tablet 3  . cyclobenzaprine (FLEXERIL) 10 MG tablet take 1 tablet by mouth every 8 hours if needed 30 tablet 6  . isosorbide mononitrate (IMDUR) 30 MG 24 hr tablet Take 1 tablet (30 mg total) by mouth daily. 90 tablet 3  . LORazepam (ATIVAN) 1 MG tablet Take 1 tablet (1 mg total)  by mouth 2 (two) times daily. 60 tablet 5  . nitroGLYCERIN (NITROSTAT) 0.4 MG SL tablet Place 1 tablet (0.4 mg total) under the tongue every 5 (five) minutes as needed for chest pain (MAX 3 doses.). 25 tablet 3  . oxyCODONE-acetaminophen (PERCOCET) 10-325 MG tablet 1/2-1 tab po q6h prn severe pain 60 tablet 0  . rosuvastatin (CRESTOR) 20 MG tablet Take 1 tablet (20 mg total) by mouth daily. 30 tablet 11  . fenofibrate 160 MG tablet take 1 tablet by mouth once daily with food (Patient taking differently: Take 160 mg by mouth daily with breakfast. t) 30 tablet 6  . HYDROcodone-acetaminophen (NORCO/VICODIN) 5-325 MG tablet take 1 tablet by mouth every 6 hours if needed for pain 90 tablet 0  . pantoprazole (PROTONIX) 40 MG tablet Take 1 tablet (40 mg total) by mouth daily. 30 tablet 11   No facility-administered medications prior to visit.    Allergies  Allergen Reactions  . Aspirin Other (See Comments)     GI Bleed  . Beta Adrenergic Blockers Other (See Comments)    REACTION: decreased libido  . Nsaids Other (See Comments)     GI Upset  . Penicillins Swelling  . Prochlorperazine Edisylate Other (See Comments)     Stroke like symptoms  . Sulfonamide Derivatives Other (See Comments)    Unknown allergic reaction  . Amitriptyline Hcl Palpitations     increased heart rate    ROS As per HPI  PE: Blood pressure 149/80, pulse 52, temperature 98.3 F (36.8 C), temperature source Oral, resp. rate 16, height 5\' 5"  (1.651 m), weight 192 lb 12 oz (87.431 kg), SpO2 92 %. Gen: Alert, well appearing.  Patient is oriented to person, place, time, and situation. CV: RRR, occ ectopic beat, no m/r/g.   LUNGS: CTA bilat, nonlabored resps, good aeration in all lung fields. EXT: no clubbing, cyanosis, or edema.   LABS:  Lab Results  Component Value Date   TSH 0.807 05/10/2015   Lab Results  Component Value Date   WBC 8.3 05/10/2015   HGB 12.8 05/10/2015   HCT 38.1 05/10/2015   MCV 92.0  05/10/2015   PLT 203 05/10/2015   Lab Results  Component Value Date   CREATININE 0.74 05/10/2015   BUN 16 05/10/2015   NA 142 05/10/2015   K 4.0 05/10/2015   CL 110 05/10/2015   CO2 28 05/10/2015   Lab Results  Component Value Date   ALT 22 03/10/2015   AST 21 03/10/2015   ALKPHOS 51 03/10/2015   BILITOT 0.6 03/10/2015   Lab Results  Component Value Date   CHOL 154  03/10/2015   Lab Results  Component Value Date   HDL 47.80 03/10/2015   Lab Results  Component Value Date   LDLCALC 83 03/10/2015   Lab Results  Component Value Date   TRIG 114.0 03/10/2015   Lab Results  Component Value Date   CHOLHDL 3 03/10/2015   Lab Results  Component Value Date   HGBA1C 6.0 11/11/2014   IMPRESSION AND PLAN:  1) COPD: asymptomatic for the most part, has quit smoking > 1 yr now.  Has albut to use prn.  2) Hyperlipidemia: tolerating fibrate and statin.  Will repeat FLP at next f/u for CPE in 4 mo.  3) Chronic low back pain/DDD/hx of L spine surgery: pt resistant to the idea of further specialist eval b/c she is afraid of potential need for surgery, says she would not pursue this avenue if it was offered. Treating pain with vicodin on good faith.  Pt w/out signs of abuse, drug seeking, or diversion of med.  Will do routine urine tox screen today.  I printed rx's for vicodin 5/325, 1 tab tid prn, #90 today for this month, Feb 2017, and March 2017.  Appropriate fill on/after date was noted on each rx. She'll continue with PT via [facility].  4) Chronic anxiety; stable on chronic benzo loraz 1mg  bid.  An After Visit Summary was printed and given to the patient.  FOLLOW UP: Return in about 4 months (around 03/13/2016) for annual CPE (fasting).

## 2015-11-16 ENCOUNTER — Ambulatory Visit (HOSPITAL_COMMUNITY): Payer: Medicare Other | Attending: Family Medicine | Admitting: Physical Therapy

## 2015-11-16 DIAGNOSIS — G8929 Other chronic pain: Secondary | ICD-10-CM | POA: Diagnosis not present

## 2015-11-16 DIAGNOSIS — M2560 Stiffness of unspecified joint, not elsewhere classified: Secondary | ICD-10-CM | POA: Diagnosis not present

## 2015-11-16 DIAGNOSIS — M5410 Radiculopathy, site unspecified: Secondary | ICD-10-CM | POA: Diagnosis not present

## 2015-11-16 DIAGNOSIS — R269 Unspecified abnormalities of gait and mobility: Secondary | ICD-10-CM | POA: Insufficient documentation

## 2015-11-16 DIAGNOSIS — M5416 Radiculopathy, lumbar region: Secondary | ICD-10-CM

## 2015-11-16 DIAGNOSIS — R29898 Other symptoms and signs involving the musculoskeletal system: Secondary | ICD-10-CM | POA: Diagnosis not present

## 2015-11-16 NOTE — Patient Instructions (Signed)
Bridging    Slowly raise buttocks from floor, keeping stomach tight. Repeat _10___ times per set. Do __1__ sets per session. Do _2___ sessions per day.  http://orth.exer.us/1096   Copyright  VHI. All rights reserved.  Bent Leg Lift (Hook-Lying)    Tighten stomach and slowly raise right leg _2___ inches from floor. Keep trunk rigid. Hold __2__ seconds. Repeat 10____ times per set. Do ____ sets per session. Do ____ sessions per day. 12 http://orth.exer.us/1090   Copyright  VHI. All rights reserved.  Hip Abduction / Adduction: with Knee Flexion (Supine)    With right knee bent, gently lower knee to side and return. Repeat _10___ times per set. Do ____ sets per session. Do __1__ sessions per day.  http://orth.exer.us/682   Copyright  VHI. All rights reserved.

## 2015-11-16 NOTE — Therapy (Signed)
[place], [place], [address] Phone: [phone]   Fax:  [phone]  Physical Therapy Treatment  Patient Details  Name: Brenda [name] MRN: [id number] Date of Birth: November 17, 1955 Referring Provider: Anitra Lauth  Encounter Date: 11/16/2015      PT End of Session - 11/16/15 1503    Visit Number 2   Number of Visits 8   Date for PT Re-Evaluation 12/03/15   Authorization Type medicare/caid   PT Start Time 1435   PT Stop Time 1513   PT Time Calculation (min) 38 min   Activity Tolerance Patient tolerated treatment well   Behavior During Therapy [facility] for tasks assessed/performed      Past Medical History  Diagnosis Date  . Chronic LBP   . DDD (degenerative disc disease)     spinal stenosis  . Hyperlipidemia     mixed  . History of GI bleed     NSAIDS  . Recurrent UTI   . Microscopic hematuria     full w/ u unrevealing X 2  . Tobacco dependence in remission     Quit fall 2015.  Only occasional bronchitis illness (CT shest 2009 nl- for f/u of ? nodule on CXR)  . Atypical chest pain 2007; 2015    cardiolyte neg, echo nl, cath showed mild/nonobstructive LAD disease  . Normal nuclear stress test 11/11 and 06/2014    negative, EF normal  . Palpitations 2006    48H holter neg  . Osteoarthritis of knee 06/30/2012    Right   . COPD (chronic obstructive pulmonary disease) ([place])   . Fatty liver     Past Surgical History  Procedure Laterality Date  . Lumbar spine surgery  1993    right iliac crest bone graft+metal instrumentation; 2005 metal removal and decompression, 2011 lumbar decompression 4-11, then stabilization/ instrumentation done 09-19-10: L2,L3, L5 left and L2 , L3, L4 right pedical remnant L4 left embedded. Left iliac crest bone graft-- Dr Velna Ochs  . Appendectomy  1984  . Abdominal hysterectomy  1997    DUB  . Oophorectomy Right     cyst  . Tonsillectomy  60 yrs old  . Knee arthroscopy Bilateral   .  Left wrist ganglion cyst excision  2010  . Tonsillectomy    . Cardiac catheterization  10/09/2005    no CAD, mildly elevated LVEDP, normal LV function (Dr. Gerrie Nordmann)  . Cardiovascular stress test  06/2014    normal lexiscan NST  . Transthoracic echocardiogram  2006    EF=>55%, trace MR, mild TR, trace AV regurg, trace pulm valve regurg   . Cardiovascular stress test  2006    persantine - no ischemia, low risk   . Cardiac catheterization N/A 05/16/2015    Mild non-obstructive CAD--med mgmt.  Procedure: Left Heart Cath and Coronary Angiography;  Surgeon: Pixie Casino, MD;  Location: [facility];  Service: Cardiovascular;  Laterality: N/A;    There were no vitals filed for this visit.  Visit Diagnosis:  Chronic radicular low back pain  Bilateral leg weakness  Abnormality of gait  Stiffness of unspecified joint, not elsewhere classified      Subjective Assessment - 11/16/15 1439    Subjective Pt states that her pain is a 3/10.  She has been doing her exercises and has no questions.    Currently in Pain? Yes   Pain Score 3    Pain Location Back   Pain Orientation Lower  Pain Descriptors / Indicators Aching   Pain Type Chronic pain                         [facility] Adult PT Treatment/Exercise - 11/16/15 0001    Lumbar Exercises: Stretches   Active Hamstring Stretch 3 reps;30 seconds   Single Knee to Chest Stretch 3 reps;30 seconds   Lower Trunk Rotation 5 reps   Lumbar Exercises: Supine   Ab Set 10 reps   Clam 10 reps   Bent Knee Raise 10 reps   Bridge 10 reps   Other Supine Lumbar Exercises decompression exercises 1-5    Lumbar Exercises: Sidelying   Hip Abduction 10 reps                PT Education - 11/16/15 1503    Education provided Yes   Education Details decompression exercises; supine stabilzation   Person(s) Educated Patient   Methods Explanation;Handout;Demonstration   Comprehension Verbalized understanding;Returned  demonstration          PT Short Term Goals - 11/03/15 1614    PT SHORT TERM GOAL #1   Title I in HEP to assist in decreasing pain to no greater than a 5 for improved quality of life.   Time 2   Period Weeks   PT SHORT TERM GOAL #2   Title Pt radicular sx to be no futher than her knee to demonstrate decreased tension on nerve.   Time 2   Period Weeks   PT SHORT TERM GOAL #3   Title Pt to be able to sit for an hour to travel without increased disconfort    Time 2   Period Weeks           PT Long Term Goals - 11/03/15 1616    PT LONG TERM GOAL #1   Title I in advanced HEP in order to decrease pain no greater than a 3/10 80% of the time for improved quality of life.   Time 4   Period Weeks   PT LONG TERM GOAL #2   Title PT to have increased leg/core strength by one grade to be able to bring a bag of groceies into the house without increased pain   Time 4   Period Weeks   PT LONG TERM GOAL #3   Title Pt to be able to sit for 2 hours without inceased discomfort for traveling/ going to a movie.   PT LONG TERM GOAL #4   Title Pt to be able to walk for 40 minutes for improved healthy lifesyle.   Time 4   Period Weeks               Plan - 11/16/15 1507    Clinical Impression Statement Pt instructed in supine sttabliliztion and decompression exercises.  Pt has difficulty keeping back stabilized even with verbal cuing.  Pt had minimal difficulty with decompression exercises.    Pt will benefit from skilled therapeutic intervention in order to improve on the following deficits Decreased activity tolerance;Decreased range of motion;Decreased strength;Difficulty walking;Hypomobility;Pain   Rehab Potential Fair   PT Frequency 2x / week   PT Duration 4 weeks   PT Next Visit Plan teach decomprssion supine exercises with t-band         Problem List Patient Active Problem List   Diagnosis Date Noted  . Chronic low back pain 10/12/2015  . Unstable angina ([place]) 05/04/2015   . CAD (coronary artery disease) 06/25/2014  .  Chest pain 06/25/2014  . Paresthesia of both feet 03/11/2014  . Health maintenance examination 11/12/2013  . Acute exacerbation of chronic obstructive pulmonary disease (COPD) ([place]) 08/13/2013  . CAP (community acquired pneumonia) 08/13/2013  . Arthritis of knee, degenerative 04/10/2013  . H/O Spinal surgery 03/11/2013  . Degenerative disc disease, lumbar 03/11/2013  . Degenerative arthritis of right knee 03/11/2013  . Anxiety state 03/03/2013  . COPD (chronic obstructive pulmonary disease) ([place]) 03/03/2013  . Osteoarthritis of knee 06/30/2012  . Right knee pain 06/26/2012  . Colon cancer screening 02/21/2012  . Breast cancer screening 02/21/2012  . Prediabetes 11/09/2010  . Hyperlipidemia 11/09/2010  . TOBACCO ABUSE 11/09/2010  . BACK PAIN, LUMBAR, CHRONIC 11/09/2010  . HEMATURIA, MICROSCOPIC, HX OF 11/09/2010   Rayetta Humphrey, PT CLT [phone] 11/16/2015, 3:14 PM  [place] [address] [place], [place], [address] Phone: [phone]   Fax:  [phone]  Name: Brenda [name] MRN: [id number] Date of Birth: October 11, 1956

## 2015-11-17 ENCOUNTER — Ambulatory Visit (HOSPITAL_COMMUNITY): Payer: Medicare Other | Admitting: Physical Therapy

## 2015-11-22 ENCOUNTER — Ambulatory Visit (HOSPITAL_COMMUNITY): Payer: Medicare Other | Admitting: Physical Therapy

## 2015-11-24 ENCOUNTER — Ambulatory Visit (HOSPITAL_COMMUNITY): Payer: Medicare Other | Admitting: Physical Therapy

## 2015-11-24 DIAGNOSIS — R269 Unspecified abnormalities of gait and mobility: Secondary | ICD-10-CM

## 2015-11-24 DIAGNOSIS — M2560 Stiffness of unspecified joint, not elsewhere classified: Secondary | ICD-10-CM | POA: Diagnosis not present

## 2015-11-24 DIAGNOSIS — M5416 Radiculopathy, lumbar region: Principal | ICD-10-CM

## 2015-11-24 DIAGNOSIS — R29898 Other symptoms and signs involving the musculoskeletal system: Secondary | ICD-10-CM

## 2015-11-24 DIAGNOSIS — M5410 Radiculopathy, site unspecified: Secondary | ICD-10-CM | POA: Diagnosis not present

## 2015-11-24 DIAGNOSIS — G8929 Other chronic pain: Secondary | ICD-10-CM

## 2015-11-24 NOTE — Therapy (Signed)
[place], [place], [address] Phone: [phone]   Fax:  [phone]  Physical Therapy Treatment  Patient Details  Name: Brenda [name] MRN: [id number] Date of Birth: August 15, 1956 Referring Provider: Anitra Lauth  Encounter Date: 11/24/2015      PT End of Session - 11/24/15 1519    Visit Number 3   Number of Visits 8   Date for PT Re-Evaluation 12/03/15   Authorization Type medicare/caid   Authorization - Visit Number 3   Authorization - Number of Visits 8   PT Start Time 1440   PT Stop Time 1522   PT Time Calculation (min) 42 min   Activity Tolerance Patient tolerated treatment well   Behavior During Therapy [facility] for tasks assessed/performed      Past Medical History  Diagnosis Date  . Chronic LBP   . DDD (degenerative disc disease)     spinal stenosis  . Hyperlipidemia     mixed  . History of GI bleed     NSAIDS  . Recurrent UTI   . Microscopic hematuria     full w/ u unrevealing X 2  . Tobacco dependence in remission     Quit fall 2015.  Only occasional bronchitis illness (CT shest 2009 nl- for f/u of ? nodule on CXR)  . Atypical chest pain 2007; 2015    cardiolyte neg, echo nl, cath showed mild/nonobstructive LAD disease  . Normal nuclear stress test 11/11 and 06/2014    negative, EF normal  . Palpitations 2006    48H holter neg  . Osteoarthritis of knee 06/30/2012    Right   . COPD (chronic obstructive pulmonary disease) ([place])   . Fatty liver     Past Surgical History  Procedure Laterality Date  . Lumbar spine surgery  1993    right iliac crest bone graft+metal instrumentation; 2005 metal removal and decompression, 2011 lumbar decompression 4-11, then stabilization/ instrumentation done 09-19-10: L2,L3, L5 left and L2 , L3, L4 right pedical remnant L4 left embedded. Left iliac crest bone graft-- Dr Velna Ochs  . Appendectomy  1984  . Abdominal hysterectomy  1997    DUB  . Oophorectomy Right      cyst  . Tonsillectomy  60 yrs old  . Knee arthroscopy Bilateral   . Left wrist ganglion cyst excision  2010  . Tonsillectomy    . Cardiac catheterization  10/09/2005    no CAD, mildly elevated LVEDP, normal LV function (Dr. Gerrie Nordmann)  . Cardiovascular stress test  06/2014    normal lexiscan NST  . Transthoracic echocardiogram  2006    EF=>55%, trace MR, mild TR, trace AV regurg, trace pulm valve regurg   . Cardiovascular stress test  2006    persantine - no ischemia, low risk   . Cardiac catheterization N/A 05/16/2015    Mild non-obstructive CAD--med mgmt.  Procedure: Left Heart Cath and Coronary Angiography;  Surgeon: Pixie Casino, MD;  Location: [place] [facility];  Service: Cardiovascular;  Laterality: N/A;    There were no vitals filed for this visit.  Visit Diagnosis:  Chronic radicular low back pain  Bilateral leg weakness  Abnormality of gait  Stiffness of unspecified joint, not elsewhere classified      Subjective Assessment - 11/24/15 1441    Subjective PT states her pain is currently 2/10 after taking pain meds but states she was hurting 5/10 prior and 8-9/10 yesterday.  STates she even got into floor  to see if that relieved it.     Currently in Pain? Yes   Pain Score 2    Pain Orientation Right;Lower                         [facility] Adult PT Treatment/Exercise - 11/24/15 1452    Lumbar Exercises: Stretches   Active Hamstring Stretch 3 reps;30 seconds   Single Knee to Chest Stretch 3 reps;30 seconds   Lower Trunk Rotation 5 reps   Piriformis Stretch 3 reps;30 seconds   Piriformis Stretch Limitations seated    Lumbar Exercises: Supine   Ab Set 10 reps   Clam 10 reps   Bent Knee Raise 10 reps   Bridge 10 reps   Straight Leg Raise 10 reps   Other Supine Lumbar Exercises decompression exercises  #1-5 5reps 3" holds   Other Supine Lumbar Exercises decompression tband ex #1-4 5 reps   Lumbar Exercises: Sidelying   Hip Abduction 10 reps                 PT Education - 11/24/15 1457    Education provided Yes   Education Details decompression tband exercises   Person(s) Educated Patient   Methods Explanation;Demonstration;Handout   Comprehension Verbalized understanding;Returned demonstration          PT Short Term Goals - 11/03/15 1614    PT SHORT TERM GOAL #1   Title I in HEP to assist in decreasing pain to no greater than a 5 for improved quality of life.   Time 2   Period Weeks   PT SHORT TERM GOAL #2   Title Pt radicular sx to be no futher than her knee to demonstrate decreased tension on nerve.   Time 2   Period Weeks   PT SHORT TERM GOAL #3   Title Pt to be able to sit for an hour to travel without increased disconfort    Time 2   Period Weeks           PT Long Term Goals - 11/03/15 1616    PT LONG TERM GOAL #1   Title I in advanced HEP in order to decrease pain no greater than a 3/10 80% of the time for improved quality of life.   Time 4   Period Weeks   PT LONG TERM GOAL #2   Title PT to have increased leg/core strength by one grade to be able to bring a bag of groceies into the house without increased pain   Time 4   Period Weeks   PT LONG TERM GOAL #3   Title Pt to be able to sit for 2 hours without inceased discomfort for traveling/ going to a movie.   PT LONG TERM GOAL #4   Title Pt to be able to walk for 40 minutes for improved healthy lifesyle.   Time 4   Period Weeks               Plan - 11/24/15 1457    Clinical Impression Statement Continued progression of stabilization exercises to increase core strength and decrease pain.  Instructed with tband decrompression therex and given written instructions/tband for HEP.  PT became tearful due to pain when completing Lt sidelying hip abduction for Rt LE.   Pt requires therapist facilitation to complete therex in correct form.     Pt will benefit from skilled therapeutic intervention in order to improve on the following deficits  Decreased activity  tolerance;Decreased range of motion;Decreased strength;Difficulty walking;Hypomobility;Pain   Rehab Potential Fair   PT Frequency 2x / week   PT Duration 4 weeks   PT Next Visit Plan continue to progress towards goals with improved core stability and education.         Problem List Patient Active Problem List   Diagnosis Date Noted  . Chronic low back pain 10/12/2015  . Unstable angina ([place]) 05/04/2015  . CAD (coronary artery disease) 06/25/2014  . Chest pain 06/25/2014  . Paresthesia of both feet 03/11/2014  . Health maintenance examination 11/12/2013  . Acute exacerbation of chronic obstructive pulmonary disease (COPD) ([place]) 08/13/2013  . CAP (community acquired pneumonia) 08/13/2013  . Arthritis of knee, degenerative 04/10/2013  . H/O Spinal surgery 03/11/2013  . Degenerative disc disease, lumbar 03/11/2013  . Degenerative arthritis of right knee 03/11/2013  . Anxiety state 03/03/2013  . COPD (chronic obstructive pulmonary disease) ([place]) 03/03/2013  . Osteoarthritis of knee 06/30/2012  . Right knee pain 06/26/2012  . Colon cancer screening 02/21/2012  . Breast cancer screening 02/21/2012  . Prediabetes 11/09/2010  . Hyperlipidemia 11/09/2010  . TOBACCO ABUSE 11/09/2010  . BACK PAIN, LUMBAR, CHRONIC 11/09/2010  . HEMATURIA, MICROSCOPIC, HX OF 11/09/2010    Teena Irani, PTA/CLT [phone]  11/24/2015, 3:20 PM  [place], [place], [address] Phone: [phone]   Fax:  [phone]  Name: Brenda [name] MRN: [id number] Date of Birth: Mar 19, 1956

## 2015-11-25 ENCOUNTER — Encounter: Payer: Self-pay | Admitting: Family Medicine

## 2015-11-29 ENCOUNTER — Ambulatory Visit (HOSPITAL_COMMUNITY): Payer: Medicare Other | Admitting: Physical Therapy

## 2015-11-29 DIAGNOSIS — G8929 Other chronic pain: Secondary | ICD-10-CM

## 2015-11-29 DIAGNOSIS — M2560 Stiffness of unspecified joint, not elsewhere classified: Secondary | ICD-10-CM | POA: Diagnosis not present

## 2015-11-29 DIAGNOSIS — R269 Unspecified abnormalities of gait and mobility: Secondary | ICD-10-CM | POA: Diagnosis not present

## 2015-11-29 DIAGNOSIS — M5416 Radiculopathy, lumbar region: Principal | ICD-10-CM

## 2015-11-29 DIAGNOSIS — R29898 Other symptoms and signs involving the musculoskeletal system: Secondary | ICD-10-CM | POA: Diagnosis not present

## 2015-11-29 DIAGNOSIS — M5410 Radiculopathy, site unspecified: Secondary | ICD-10-CM | POA: Diagnosis not present

## 2015-11-29 NOTE — Therapy (Signed)
[place]ren[place] [name], [place], [address] Phone: [phone]   Fax:  [phone]  Physical Therapy Treatment  Patient Details  Name: Brenda [name] MRN: [id number] Date of Birth: 06-28-1956 Referring Provider: Anitra Lauth  Encounter Date: 11/29/2015      PT End of Session - 11/29/15 1517    Visit Number 4   Number of Visits 8   Date for PT Re-Evaluation 12/03/15   Authorization Type medicare/caid   Authorization - Visit Number 4   Authorization - Number of Visits 8   PT Start Time [id number]   PT Stop Time 1517   PT Time Calculation (min) 41 min   Activity Tolerance Patient tolerated treatment well   Behavior During Therapy [facility] for tasks assessed/performed      Past Medical History  Diagnosis Date  . Chronic LBP   . DDD (degenerative disc disease)     spinal stenosis  . Hyperlipidemia     mixed  . History of GI bleed     NSAIDS  . Recurrent UTI   . Microscopic hematuria     full w/ u unrevealing X 2  . Tobacco dependence in remission     Quit fall 2015.  Only occasional bronchitis illness (CT shest 2009 nl- for f/u of ? nodule on CXR)  . Atypical chest pain 2007; 2015    cardiolyte neg, echo nl, cath showed mild/nonobstructive LAD disease  . Normal nuclear stress test 11/11 and 06/2014    negative, EF normal  . Palpitations 2006    48H holter neg  . Osteoarthritis of knee 06/30/2012    Right   . COPD (chronic obstructive pulmonary disease) ([place])   . Fatty liver     Past Surgical History  Procedure Laterality Date  . Lumbar spine surgery  1993    right iliac crest bone graft+metal instrumentation; 2005 metal removal and decompression, 2011 lumbar decompression 4-11, then stabilization/ instrumentation done 09-19-10: L2,L3, L5 left and L2 , L3, L4 right pedical remnant L4 left embedded. Left iliac crest bone graft-- Dr Velna Ochs  . Appendectomy  1984  . Abdominal hysterectomy  1997    DUB  . Oophorectomy Right      cyst  . Tonsillectomy  60 yrs old  . Knee arthroscopy Bilateral   . Left wrist ganglion cyst excision  2010  . Tonsillectomy    . Cardiac catheterization  10/09/2005    no CAD, mildly elevated LVEDP, normal LV function (Dr. Gerrie Nordmann)  . Cardiovascular stress test  06/2014    normal lexiscan NST  . Transthoracic echocardiogram  2006    EF=>55%, trace MR, mild TR, trace AV regurg, trace pulm valve regurg   . Cardiovascular stress test  2006    persantine - no ischemia, low risk   . Cardiac catheterization N/A 05/16/2015    Mild non-obstructive CAD--med mgmt.  Procedure: Left Heart Cath and Coronary Angiography;  Surgeon: Pixie Casino, MD;  Location: [facility];  Service: Cardiovascular;  Laterality: N/A;    There were no vitals filed for this visit.  Visit Diagnosis:  Chronic radicular low back pain  Bilateral leg weakness  Abnormality of gait  Stiffness of unspecified joint, not elsewhere classified      Subjective Assessment - 11/29/15 1442    Subjective Pt states that she has been having quite a bit of pain the last couple of days.     Currently in Pain? Yes   Pain  Score 5    Pain Location Back   Pain Orientation Lower   Pain Descriptors / Indicators Aching   Pain Type Chronic pain   Pain Radiating Towards Lt ankle    Pain Onset More than a month ago   Pain Frequency Constant   Aggravating Factors  activity    Pain Relieving Factors nothing               [facility] Adult PT Treatment/Exercise - 11/29/15 0001    Lumbar Exercises: Stretches   Active Hamstring Stretch 1 rep;60 seconds   Single Knee to Chest Stretch 3 reps;30 seconds   Lumbar Exercises: Seated   Sit to Stand 5 reps   Lumbar Exercises: Supine   Ab Set 10 reps   Bent Knee Raise 10 reps   Bridge 15 reps   Isometric Hip Flexion 10 reps   Other Supine Lumbar Exercises isometric hip adduction x 105   Lumbar Exercises: Sidelying   Hip Abduction 10 reps   Lumbar Exercises: Prone   Straight  Leg Raise 10 reps   Other Prone Lumbar Exercises glut set/ heel squeeze x 10 each    Lumbar Exercises: Quadruped   [place] 5 reps   Plank --  childs pose                   PT Short Term Goals - 11/03/15 1614    PT SHORT TERM GOAL #1   Title I in HEP to assist in decreasing pain to no greater than a 5 for improved quality of life.   Time 2   Period Weeks   PT SHORT TERM GOAL #2   Title Pt radicular sx to be no futher than her knee to demonstrate decreased tension on nerve.   Time 2   Period Weeks   PT SHORT TERM GOAL #3   Title Pt to be able to sit for an hour to travel without increased disconfort    Time 2   Period Weeks           PT Long Term Goals - 11/03/15 1616    PT LONG TERM GOAL #1   Title I in advanced HEP in order to decrease pain no greater than a 3/10 80% of the time for improved quality of life.   Time 4   Period Weeks   PT LONG TERM GOAL #2   Title PT to have increased leg/core strength by one grade to be able to bring a bag of groceies into the house without increased pain   Time 4   Period Weeks   PT LONG TERM GOAL #3   Title Pt to be able to sit for 2 hours without inceased discomfort for traveling/ going to a movie.   PT LONG TERM GOAL #4   Title Pt to be able to walk for 40 minutes for improved healthy lifesyle.   Time 4   Period Weeks               Plan - 11/29/15 1453    Clinical Impression Statement Pt became tearful with prone hip extension stating that she could not lift her leg.  Explained that if she could stand from a seated postion she had enough strength to complete this exercises.  Pt able to complete exercise but continued to be tearful.  When asked what her pain level was pt stated a 5/10.  Continued with mat exercises only due to pt emotional state. Explained to pt  that she does not have to complete her therapy if she feels that it is increasing her pain but pt wants to continue.   Pt returns to [place] on 12/14/2012.      PT Next Visit Plan continue to progress towards goals with improved core stability and education.         Problem List Patient Active Problem List   Diagnosis Date Noted  . Chronic low back pain 10/12/2015  . Unstable angina ([place]) 05/04/2015  . CAD (coronary artery disease) 06/25/2014  . Chest pain 06/25/2014  . Paresthesia of both feet 03/11/2014  . Health maintenance examination 11/12/2013  . Acute exacerbation of chronic obstructive pulmonary disease (COPD) ([place]) 08/13/2013  . CAP (community acquired pneumonia) 08/13/2013  . Arthritis of knee, degenerative 04/10/2013  . H/O Spinal surgery 03/11/2013  . Degenerative disc disease, lumbar 03/11/2013  . Degenerative arthritis of right knee 03/11/2013  . Anxiety state 03/03/2013  . COPD (chronic obstructive pulmonary disease) ([place]) 03/03/2013  . Osteoarthritis of knee 06/30/2012  . Right knee pain 06/26/2012  . Colon cancer screening 02/21/2012  . Breast cancer screening 02/21/2012  . Prediabetes 11/09/2010  . Hyperlipidemia 11/09/2010  . TOBACCO ABUSE 11/09/2010  . BACK PAIN, LUMBAR, CHRONIC 11/09/2010  . HEMATURIA, MICROSCOPIC, HX OF 11/09/2010    Rayetta Humphrey, PT CLT [phone] 11/29/2015, 3:18 PM  [place] [address] [place], [place], [address] Phone: [phone]   Fax:  [phone]  Name: THALIA GOOLEY MRN: [id number] Date of Birth: 17-Oct-1956

## 2015-12-01 ENCOUNTER — Ambulatory Visit (HOSPITAL_COMMUNITY): Payer: Medicare Other

## 2015-12-06 ENCOUNTER — Ambulatory Visit (HOSPITAL_COMMUNITY): Payer: Medicare Other | Admitting: Physical Therapy

## 2015-12-06 ENCOUNTER — Telehealth (HOSPITAL_COMMUNITY): Payer: Self-pay | Admitting: Physical Therapy

## 2015-12-06 NOTE — Telephone Encounter (Signed)
She cx b/c her back and legs are still hurting to much to come to therapy. NF 12/06/15

## 2015-12-08 ENCOUNTER — Ambulatory Visit (HOSPITAL_COMMUNITY): Payer: Medicare Other | Admitting: Physical Therapy

## 2015-12-08 ENCOUNTER — Telehealth: Payer: Self-pay | Admitting: Family Medicine

## 2015-12-08 ENCOUNTER — Telehealth (HOSPITAL_COMMUNITY): Payer: Self-pay | Admitting: Physical Therapy

## 2015-12-08 NOTE — Telephone Encounter (Signed)
Patient states PT is making pain worse. Please advise.

## 2015-12-08 NOTE — Telephone Encounter (Signed)
Pt called to cx she is in a lot of pain and waiting on the MD office to call her back to get apptment with them. Pt will call back to reschedule later NF 12/08/15

## 2015-12-08 NOTE — Telephone Encounter (Signed)
She needs to make f/u appt with her back surgeon.

## 2015-12-08 NOTE — Telephone Encounter (Signed)
Please advise. Thanks.  

## 2015-12-08 NOTE — Telephone Encounter (Signed)
Pt advised and voiced understanding.   

## 2015-12-13 ENCOUNTER — Telehealth (HOSPITAL_COMMUNITY): Payer: Self-pay | Admitting: Physical Therapy

## 2015-12-13 NOTE — Telephone Encounter (Signed)
Pt will be seen by Dr. Velna Ochs at [facility]oped[facility] in [place] on Feb 27th @ 10 am. NF 12/13/15

## 2015-12-14 ENCOUNTER — Ambulatory Visit (INDEPENDENT_AMBULATORY_CARE_PROVIDER_SITE_OTHER): Payer: Medicare Other | Admitting: Internal Medicine

## 2015-12-14 ENCOUNTER — Encounter: Payer: Self-pay | Admitting: Internal Medicine

## 2015-12-14 VITALS — BP 138/80 | HR 72 | Ht 65.0 in | Wt 193.0 lb

## 2015-12-14 DIAGNOSIS — M5136 Other intervertebral disc degeneration, lumbar region: Secondary | ICD-10-CM

## 2015-12-14 DIAGNOSIS — F411 Generalized anxiety disorder: Secondary | ICD-10-CM | POA: Diagnosis not present

## 2015-12-14 DIAGNOSIS — F17200 Nicotine dependence, unspecified, uncomplicated: Secondary | ICD-10-CM | POA: Diagnosis not present

## 2015-12-14 DIAGNOSIS — E785 Hyperlipidemia, unspecified: Secondary | ICD-10-CM | POA: Diagnosis not present

## 2015-12-14 NOTE — Progress Notes (Signed)
OFFICE NOTE  Chief Complaint:  Routine follow-up  Primary Care Physician: Tammi Sou, MD  HPI:  Brenda [name] is a pleasant 60 year old female, referred to me for evaluation of chest pain. She had a previous evaluation of chest pain by Dr. Leslye Peer about 10 years ago including a cardiac catheterization that showed a moderate stenosis and one-vessel. No stents were placed at that time. There is a family history of heart disease and her father had a heart attack in his 94s. Briefly she been having some chest pain and went to the emergency department at the end of June in any pain. She was out for MI and was sent home. She's also had progressive chest tightness with minimal exertion. The other night she had chest pain which woke her up and this is somewhat worrisome for her. Just he has dyslipidemia but is well treated on Crestor and fenofibrate. She does have an allergy to aspirin and is not currently on any antiplatelet therapy.  Brenda [name] was recently seen in follow-up. Her nuclear stress test was negative for ischemia. She continues to report short episodes of ongoing chest pain which are sometimes related to exertion but other times occur at rest. I elected to start low-dose beta blocker to see if it improved her symptoms and she reports very little difference. She did stop smoking about 4 weeks ago and so far is doing well. I commended her on that. Her symptoms certainly could be do to small vessel ischemia and one must consider this possibility.  I saw Brenda [name] back in the office today. She reports that she's had progressive chest pain. Her last stress test was in 2015 and negative for ischemia, but recently she's been using nitroglycerin about 4-5 times per week and does get some mild improvement in her chest pain. She says the symptoms have been coming more frequently and she stopped her walking and exercise because of it. Because of this she attributes a 30 pound  weight gain. She's also had worsening back pain because of her inability to walk.  I had the pleasure of seeing Brenda [name] back today in the office for follow-up. At her last office visit she was describing worsening chest pressure and she underwent cardiac catheterization by myself. This demonstrated the following:   Prox LAD to Mid LAD lesion, 40% stenosed.  Dist LAD lesion, 30% stenosed.  Mild non-obstructive CAD. No significant interval change compared to the prior cath report in 2006. Will add plavix 75 mg daily on discharge. Follow-up in 2 weeks.  Since that time she's done very well. She denies any other significant chest pain symptoms that were similar to her prior symptoms. She is now contemplating possible repeat back surgery and is seeing Dr. Loreen Freud in [place]. From my standpoint I feel that she would be low risk for any upcoming surgery if that was planned. She would have to hold Plavix for 5 days prior to the surgery.  PMHx:  Past Medical History  Diagnosis Date  . Chronic LBP   . DDD (degenerative disc disease)     spinal stenosis  . Hyperlipidemia     mixed  . History of GI bleed     NSAIDS  . Recurrent UTI   . Microscopic hematuria     full w/ u unrevealing X 2  . Tobacco dependence in remission     Quit fall 2015.  Only occasional bronchitis illness (CT shest 2009 nl- for f/u of ? nodule  on CXR)  . Atypical chest pain 2007; 2015    cardiolyte neg, echo nl, cath showed mild/nonobstructive LAD disease  . Normal nuclear stress test 11/11 and 06/2014    negative, EF normal  . Palpitations 2006    48H holter neg  . Osteoarthritis of knee 06/30/2012    Right   . COPD (chronic obstructive pulmonary disease) ([place])   . Fatty liver     Past Surgical History  Procedure Laterality Date  . Lumbar spine surgery  1993    right iliac crest bone graft+metal instrumentation; 2005 metal removal and decompression, 2011 lumbar decompression 4-11, then stabilization/  instrumentation done 09-19-10: L2,L3, L5 left and L2 , L3, L4 right pedical remnant L4 left embedded. Left iliac crest bone graft-- Dr Velna Ochs  . Appendectomy  1984  . Abdominal hysterectomy  1997    DUB  . Oophorectomy Right     cyst  . Tonsillectomy  60 yrs old  . Knee arthroscopy Bilateral   . Left wrist ganglion cyst excision  2010  . Tonsillectomy    . Cardiac catheterization  10/09/2005    no CAD, mildly elevated LVEDP, normal LV function (Dr. Gerrie Nordmann)  . Cardiovascular stress test  06/2014    normal lexiscan NST  . Transthoracic echocardiogram  2006    EF=>55%, trace MR, mild TR, trace AV regurg, trace pulm valve regurg   . Cardiovascular stress test  2006    persantine - no ischemia, low risk   . Cardiac catheterization N/A 05/16/2015    Mild non-obstructive CAD--med mgmt.  Procedure: Left Heart Cath and Coronary Angiography;  Surgeon: Pixie Casino, MD;  Location: [facility];  Service: Cardiovascular;  Laterality: N/A;    FAMHx:  Family History  Problem Relation Age of Onset  . Coronary artery disease Neg Hx   . Heart Problems Mother     and thyroid problems  . Hyperlipidemia Mother   . Diabetes Mother   . Breast cancer Mother   . Heart failure Father     CHF, heart attack, diabetes, hyperlipidemia  . Heart disease Brother     back problems  . Hypertension Brother   . Cancer Paternal Grandmother     mouth - snuff  . Heart attack Paternal Grandfather     stroke, HTN  . Hyperlipidemia Brother   . Heart attack Brother   . Cancer Brother   . Stroke Maternal Grandfather   . Kidney failure Maternal Grandmother     SOCHx:   reports that she quit smoking about 16 months ago. Her smoking use included Cigarettes. She has a 35 pack-year smoking history. She has never used smokeless tobacco. She reports that she does not drink alcohol or use illicit drugs.  ALLERGIES:  Allergies  Allergen Reactions  . Aspirin Other (See Comments)     GI Bleed  . Beta  Adrenergic Blockers Other (See Comments)    REACTION: decreased libido  . Nsaids Other (See Comments)     GI Upset  . Penicillins Swelling  . Prochlorperazine Edisylate Other (See Comments)     Stroke like symptoms  . Sulfonamide Derivatives Other (See Comments)    Unknown allergic reaction  . Amitriptyline Hcl Palpitations     increased heart rate    ROS: Pertinent items noted in HPI and remainder of comprehensive ROS otherwise negative.  HOME MEDS: Current Outpatient Prescriptions  Medication Sig Dispense Refill  . acetaminophen (TYLENOL) 500 MG tablet Take 500 mg by mouth  every 6 (six) hours as needed (pain).    Marland Kitchen albuterol (PROVENTIL HFA;VENTOLIN HFA) 108 (90 BASE) MCG/ACT inhaler Inhale into the lungs every 4 (four) hours as needed for wheezing or shortness of breath (bronchitis).    Marland Kitchen albuterol (PROVENTIL) (2.5 MG/3ML) 0.083% nebulizer solution Take 3 mLs (2.5 mg total) by nebulization every 4 (four) hours as needed for wheezing or shortness of breath. (Patient taking differently: Take 2.5 mg by nebulization every 4 (four) hours as needed for wheezing or shortness of breath (bronchitis). ) 75 mL 1  . atenolol (TENORMIN) 25 MG tablet Take 1 tablet (25 mg total) by mouth 2 (two) times daily. 60 tablet 6  . clopidogrel (PLAVIX) 75 MG tablet Take 1 tablet (75 mg total) by mouth daily. 90 tablet 3  . cyclobenzaprine (FLEXERIL) 10 MG tablet take 1 tablet by mouth every 8 hours if needed 30 tablet 6  . fenofibrate 160 MG tablet take 1 tablet by mouth once daily with food 30 tablet 12  . HYDROcodone-acetaminophen (NORCO/VICODIN) 5-325 MG tablet take 1 tablet by mouth every 6 hours if needed for pain 90 tablet 0  . isosorbide mononitrate (IMDUR) 30 MG 24 hr tablet Take 1 tablet (30 mg total) by mouth daily. 90 tablet 3  . LORazepam (ATIVAN) 1 MG tablet Take 1 tablet (1 mg total) by mouth 2 (two) times daily. 60 tablet 5  . nitroGLYCERIN (NITROSTAT) 0.4 MG SL tablet Place 1 tablet (0.4 mg  total) under the tongue every 5 (five) minutes as needed for chest pain (MAX 3 doses.). 25 tablet 3  . oxyCODONE-acetaminophen (PERCOCET) 10-325 MG tablet 1/2-1 tab po q6h prn severe pain 60 tablet 0  . pantoprazole (PROTONIX) 40 MG tablet Take 1 tablet (40 mg total) by mouth daily. 30 tablet 11  . rosuvastatin (CRESTOR) 20 MG tablet Take 1 tablet (20 mg total) by mouth daily. 30 tablet 11   No current facility-administered medications for this visit.    LABS/IMAGING: No results found for this or any previous visit (from the past 48 hour(s)). No results found.  VITALS: BP 138/80 mmHg  Pulse 72  Ht 5\' 5"  (1.651 m)  Wt 193 lb (87.544 kg)  BMI 32.12 kg/m2  EXAM: General appearance: alert and no distress Neck: no carotid bruit, no JVD and thyroid not enlarged, symmetric, no tenderness/mass/nodules Lungs: clear to auscultation bilaterally Heart: regular rate and rhythm, S1, S2 normal, no murmur, click, rub or gallop Abdomen: soft, non-tender; bowel sounds normal; no masses,  no organomegaly Extremities: extremities normal, atraumatic, no cyanosis or edema Pulses: 2+ and symmetric Skin: Skin color, texture, turgor normal. No rashes or lesions Neurologic: Grossly normal PSych: Mildly anxious  EKG: deferred  ASSESSMENT: 1. Stable mild to moderate coronary disease by left heart catheterization in 05/2015 2. History of moderate coronary artery disease - negative nuclear stress test in August 2015 3. Dyslipidemia 4. Family history of premature coronary disease 5. Low risk for upcoming back surgery  PLAN: 1.   Mrs. Ridge continues to struggle with low back pain. She's had a number of operations in the past and is seeing an orthopedist in [place]. She would be low risk if she needed recurrent back surgery but would have to hold Plavix for 5 days prior to that procedure. She has moderate coronary disease and seems to be doing well on medical management. Plan to see her back in 6 months  or sooner as necessary.  Pixie Casino, MD, [facility] Attending Cardiologist [place]  Nadean Corwin Zasha Belleau 12/14/2015, 1:36 PM

## 2015-12-14 NOTE — Patient Instructions (Signed)
Your physician wants you to follow-up in: 6 months or sooner if needed. You will receive a reminder letter in the mail two months in advance. If you don't receive a letter, please call our office to schedule the follow-up appointment.   If you need a refill on your cardiac medications before your next appointment, please call your pharmacy. 

## 2016-01-02 DIAGNOSIS — Z981 Arthrodesis status: Secondary | ICD-10-CM | POA: Diagnosis not present

## 2016-01-02 DIAGNOSIS — M5136 Other intervertebral disc degeneration, lumbar region: Secondary | ICD-10-CM | POA: Diagnosis not present

## 2016-01-02 DIAGNOSIS — M5431 Sciatica, right side: Secondary | ICD-10-CM | POA: Diagnosis not present

## 2016-01-02 DIAGNOSIS — M5432 Sciatica, left side: Secondary | ICD-10-CM | POA: Diagnosis not present

## 2016-01-03 ENCOUNTER — Other Ambulatory Visit: Payer: Self-pay | Admitting: *Deleted

## 2016-01-03 MED ORDER — OXYCODONE-ACETAMINOPHEN 10-325 MG PO TABS: ORAL_TABLET | ORAL | Status: DC

## 2016-01-03 NOTE — Telephone Encounter (Signed)
Pt called reqeusting refill. She stated that she has seen the back surgeon in [place]. She stated that she has to go back to do an MRI. She wants to know if Dr. Anitra Lauth will give her a refill to get her by til then. She stated that the hydrocodone ins not helping with her back pain.  Please advise. Thanks.   RF request for oxycodone/apap LOV: 11/14/15 Next ov: 03/12/16  Last written: 10/12/15 #60 w/ 0RF

## 2016-01-04 NOTE — Telephone Encounter (Signed)
Rx put up front for p/u. Pts husband advised and voiced understanding.  

## 2016-01-12 DIAGNOSIS — Z981 Arthrodesis status: Secondary | ICD-10-CM | POA: Diagnosis not present

## 2016-01-12 DIAGNOSIS — M5136 Other intervertebral disc degeneration, lumbar region: Secondary | ICD-10-CM | POA: Diagnosis not present

## 2016-01-12 DIAGNOSIS — M5137 Other intervertebral disc degeneration, lumbosacral region: Secondary | ICD-10-CM | POA: Diagnosis not present

## 2016-01-12 DIAGNOSIS — M47818 Spondylosis without myelopathy or radiculopathy, sacral and sacrococcygeal region: Secondary | ICD-10-CM | POA: Diagnosis not present

## 2016-01-12 DIAGNOSIS — M5126 Other intervertebral disc displacement, lumbar region: Secondary | ICD-10-CM | POA: Diagnosis not present

## 2016-01-12 DIAGNOSIS — M549 Dorsalgia, unspecified: Secondary | ICD-10-CM | POA: Diagnosis not present

## 2016-01-16 DIAGNOSIS — M5136 Other intervertebral disc degeneration, lumbar region: Secondary | ICD-10-CM | POA: Diagnosis not present

## 2016-01-16 DIAGNOSIS — Z981 Arthrodesis status: Secondary | ICD-10-CM | POA: Diagnosis not present

## 2016-01-16 DIAGNOSIS — M5432 Sciatica, left side: Secondary | ICD-10-CM | POA: Diagnosis not present

## 2016-01-16 DIAGNOSIS — M5431 Sciatica, right side: Secondary | ICD-10-CM | POA: Diagnosis not present

## 2016-01-18 ENCOUNTER — Encounter: Payer: Self-pay | Admitting: Family Medicine

## 2016-01-30 DIAGNOSIS — M5136 Other intervertebral disc degeneration, lumbar region: Secondary | ICD-10-CM | POA: Diagnosis not present

## 2016-01-30 DIAGNOSIS — R030 Elevated blood-pressure reading, without diagnosis of hypertension: Secondary | ICD-10-CM | POA: Diagnosis not present

## 2016-01-30 DIAGNOSIS — N390 Urinary tract infection, site not specified: Secondary | ICD-10-CM | POA: Diagnosis not present

## 2016-01-30 DIAGNOSIS — F411 Generalized anxiety disorder: Secondary | ICD-10-CM | POA: Diagnosis not present

## 2016-01-30 DIAGNOSIS — Z01818 Encounter for other preprocedural examination: Secondary | ICD-10-CM | POA: Diagnosis not present

## 2016-01-30 DIAGNOSIS — M5432 Sciatica, left side: Secondary | ICD-10-CM | POA: Diagnosis not present

## 2016-01-30 DIAGNOSIS — Z981 Arthrodesis status: Secondary | ICD-10-CM | POA: Diagnosis not present

## 2016-01-30 DIAGNOSIS — I2510 Atherosclerotic heart disease of native coronary artery without angina pectoris: Secondary | ICD-10-CM | POA: Diagnosis not present

## 2016-01-30 DIAGNOSIS — J449 Chronic obstructive pulmonary disease, unspecified: Secondary | ICD-10-CM | POA: Diagnosis not present

## 2016-01-30 DIAGNOSIS — K219 Gastro-esophageal reflux disease without esophagitis: Secondary | ICD-10-CM | POA: Insufficient documentation

## 2016-01-30 DIAGNOSIS — R7303 Prediabetes: Secondary | ICD-10-CM | POA: Diagnosis not present

## 2016-01-30 DIAGNOSIS — M4806 Spinal stenosis, lumbar region: Secondary | ICD-10-CM | POA: Diagnosis not present

## 2016-01-30 DIAGNOSIS — Z87891 Personal history of nicotine dependence: Secondary | ICD-10-CM | POA: Diagnosis not present

## 2016-01-30 DIAGNOSIS — R7309 Other abnormal glucose: Secondary | ICD-10-CM | POA: Diagnosis not present

## 2016-01-30 DIAGNOSIS — M5106 Intervertebral disc disorders with myelopathy, lumbar region: Secondary | ICD-10-CM | POA: Diagnosis not present

## 2016-01-30 DIAGNOSIS — M5431 Sciatica, right side: Secondary | ICD-10-CM | POA: Diagnosis not present

## 2016-02-02 ENCOUNTER — Encounter: Payer: Self-pay | Admitting: Family Medicine

## 2016-02-07 ENCOUNTER — Other Ambulatory Visit: Payer: Self-pay | Admitting: Family Medicine

## 2016-02-08 NOTE — Telephone Encounter (Signed)
RF request for lorazepam LOV: 11/14/15 Next ov: 03/12/16 Last written: 07/14/15 #60 w/ 5RF  Please advise. Thanks.

## 2016-02-09 NOTE — Telephone Encounter (Signed)
Rx faxed

## 2016-02-10 DIAGNOSIS — I2510 Atherosclerotic heart disease of native coronary artery without angina pectoris: Secondary | ICD-10-CM | POA: Diagnosis not present

## 2016-02-10 DIAGNOSIS — M4806 Spinal stenosis, lumbar region: Secondary | ICD-10-CM | POA: Diagnosis present

## 2016-02-10 DIAGNOSIS — F411 Generalized anxiety disorder: Secondary | ICD-10-CM | POA: Diagnosis present

## 2016-02-10 DIAGNOSIS — R7303 Prediabetes: Secondary | ICD-10-CM | POA: Diagnosis present

## 2016-02-10 DIAGNOSIS — M5106 Intervertebral disc disorders with myelopathy, lumbar region: Secondary | ICD-10-CM | POA: Diagnosis not present

## 2016-02-10 DIAGNOSIS — J449 Chronic obstructive pulmonary disease, unspecified: Secondary | ICD-10-CM | POA: Diagnosis present

## 2016-02-10 DIAGNOSIS — M5136 Other intervertebral disc degeneration, lumbar region: Secondary | ICD-10-CM | POA: Diagnosis not present

## 2016-02-10 DIAGNOSIS — Z981 Arthrodesis status: Secondary | ICD-10-CM | POA: Diagnosis not present

## 2016-02-10 DIAGNOSIS — M4716 Other spondylosis with myelopathy, lumbar region: Secondary | ICD-10-CM | POA: Diagnosis present

## 2016-02-10 DIAGNOSIS — J9811 Atelectasis: Secondary | ICD-10-CM | POA: Diagnosis not present

## 2016-02-10 DIAGNOSIS — M48061 Spinal stenosis, lumbar region without neurogenic claudication: Secondary | ICD-10-CM | POA: Insufficient documentation

## 2016-02-10 DIAGNOSIS — K219 Gastro-esophageal reflux disease without esophagitis: Secondary | ICD-10-CM | POA: Diagnosis present

## 2016-02-10 DIAGNOSIS — N3010 Interstitial cystitis (chronic) without hematuria: Secondary | ICD-10-CM | POA: Diagnosis present

## 2016-02-10 DIAGNOSIS — K5909 Other constipation: Secondary | ICD-10-CM | POA: Diagnosis present

## 2016-02-10 DIAGNOSIS — M5431 Sciatica, right side: Secondary | ICD-10-CM | POA: Insufficient documentation

## 2016-02-10 DIAGNOSIS — M5432 Sciatica, left side: Secondary | ICD-10-CM | POA: Diagnosis not present

## 2016-02-10 HISTORY — PX: LUMBAR SPINE SURGERY: SHX701

## 2016-02-27 ENCOUNTER — Other Ambulatory Visit: Payer: Self-pay | Admitting: *Deleted

## 2016-02-27 DIAGNOSIS — G8929 Other chronic pain: Secondary | ICD-10-CM

## 2016-02-27 DIAGNOSIS — M549 Dorsalgia, unspecified: Principal | ICD-10-CM

## 2016-02-27 MED ORDER — HYDROCODONE-ACETAMINOPHEN 5-325 MG PO TABS: ORAL_TABLET | ORAL | Status: DC

## 2016-02-27 NOTE — Telephone Encounter (Signed)
RF request for hyrdocodone LOV: 11/14/15 Next ov: 03/12/16 Last written: 11/14/15 #90 w/ 0RF  Please advise. Thanks.

## 2016-02-27 NOTE — Telephone Encounter (Signed)
Rx put up front for p/u. Pt advised and voiced understanding.   

## 2016-03-12 ENCOUNTER — Encounter: Payer: Self-pay | Admitting: Family Medicine

## 2016-03-12 ENCOUNTER — Ambulatory Visit (INDEPENDENT_AMBULATORY_CARE_PROVIDER_SITE_OTHER): Payer: Medicare Other | Admitting: Family Medicine

## 2016-03-12 VITALS — BP 135/76 | HR 65 | Temp 97.9°F | Ht 64.5 in | Wt 188.4 lb

## 2016-03-12 DIAGNOSIS — R5082 Postprocedural fever: Secondary | ICD-10-CM

## 2016-03-12 DIAGNOSIS — E785 Hyperlipidemia, unspecified: Secondary | ICD-10-CM | POA: Diagnosis not present

## 2016-03-12 DIAGNOSIS — Z114 Encounter for screening for human immunodeficiency virus [HIV]: Secondary | ICD-10-CM | POA: Diagnosis not present

## 2016-03-12 DIAGNOSIS — Z1159 Encounter for screening for other viral diseases: Secondary | ICD-10-CM

## 2016-03-12 DIAGNOSIS — G8929 Other chronic pain: Secondary | ICD-10-CM

## 2016-03-12 DIAGNOSIS — R7309 Other abnormal glucose: Secondary | ICD-10-CM | POA: Diagnosis not present

## 2016-03-12 DIAGNOSIS — R7303 Prediabetes: Secondary | ICD-10-CM

## 2016-03-12 DIAGNOSIS — Z0000 Encounter for general adult medical examination without abnormal findings: Secondary | ICD-10-CM

## 2016-03-12 DIAGNOSIS — M549 Dorsalgia, unspecified: Secondary | ICD-10-CM

## 2016-03-12 DIAGNOSIS — J438 Other emphysema: Secondary | ICD-10-CM

## 2016-03-12 LAB — COMPREHENSIVE METABOLIC PANEL
ALT: 10 U/L (ref 0–35)
AST: 11 U/L (ref 0–37)
Albumin: 4.4 g/dL (ref 3.5–5.2)
Alkaline Phosphatase: 47 U/L (ref 39–117)
BUN: 20 mg/dL (ref 6–23)
CALCIUM: 10.2 mg/dL (ref 8.4–10.5)
CHLORIDE: 104 meq/L (ref 96–112)
CO2: 33 meq/L — AB (ref 19–32)
CREATININE: 0.66 mg/dL (ref 0.40–1.20)
GFR: 97.17 mL/min (ref >60.00)
Glucose, Bld: 112 mg/dL — ABNORMAL HIGH (ref 70–99)
Potassium: 4.3 mEq/L (ref 3.5–5.1)
Sodium: 145 mEq/L (ref 135–145)
Total Bilirubin: 0.4 mg/dL (ref 0.2–1.2)
Total Protein: 7 g/dL (ref 6.0–8.3)

## 2016-03-12 LAB — LIPID PANEL
CHOL/HDL RATIO: 3
Cholesterol: 126 mg/dL (ref 0–200)
HDL: 41.7 mg/dL (ref >39.00)
LDL CALC: 56 mg/dL (ref 0–99)
NonHDL: 84.41
TRIGLYCERIDES: 144 mg/dL (ref 0.0–149.0)
VLDL: 28.8 mg/dL (ref 0.0–40.0)

## 2016-03-12 LAB — HIV ANTIBODY (ROUTINE TESTING W REFLEX): HIV: NONREACTIVE

## 2016-03-12 LAB — POCT URINALYSIS DIPSTICK
Bilirubin, UA: NEGATIVE
GLUCOSE UA: NEGATIVE
Ketones, UA: NEGATIVE
LEUKOCYTES UA: NEGATIVE
NITRITE UA: NEGATIVE
Protein, UA: NEGATIVE
Spec Grav, UA: 1.02
UROBILINOGEN UA: 0.2
pH, UA: 5.5

## 2016-03-12 LAB — HEMOGLOBIN A1C: Hgb A1c MFr Bld: 6 % (ref 4.6–6.5)

## 2016-03-12 MED ORDER — HYDROCODONE-ACETAMINOPHEN 5-325 MG PO TABS: ORAL_TABLET | ORAL | Status: DC

## 2016-03-12 NOTE — Progress Notes (Signed)
Pre visit review using our clinic review tool, if applicable. No additional management support is needed unless otherwise documented below in the visit note. 

## 2016-03-12 NOTE — Progress Notes (Signed)
Office Note 03/12/2016  CC:  F/u chronic LBP, COPD, HLD  HPI:  Brenda [name] is a 60 y.o. White female who is here for f/u chronic LBP, COPD, HLD.  Got surgery on back about a month ago.  Had post-op fever but no source was found per pt report. She was put on cipro 500mg  bid and her surgeon has kept her on this for 40mo.  Her back feels much improved but she still requires about the same amount of pain med as she did before things flared up in low back and eventually required the surgery--in other words she is back to her baseline chronic low back pain.  She says she has had temp near 101 most days since surgery, but then says it seems to have "died down" the last 4-5 days. Has some incomplete bladder emptying sometimes but otherwise no urinary sx's. No cough or SOB.  Says a CXR was done by her back surgeon and it was "clear". She is still abstaining from smoking, says breathing feels good, no signif cough, no need for albuterol any recently. She is eating and drinking w/out problem.  Bowels moving fine. I have no records from her recent back surgery hospitalization.  Reports compliance with crestor 20mg  qd w/out complaint of side effect.  Discussed some preventative health today: she declines mammograms, saying she feels comfortable with doing BSE regularly and alerting me to any findings if there should be any.   For colon cancer screening, she chooses hemoccult ICT.  She has never had a screening colonoscopy. She is s/p hysterectomy for nonmalignant reasons, so cervical cancer screening is no applicable to her.   Past Medical History  Diagnosis Date  . Chronic LBP     MORE surgery planned by Dr. Velna Ochs as of 01/30/16 [facility])-  . DDD (degenerative disc disease)     spinal stenosis  . Hyperlipidemia     mixed  . History of GI bleed     NSAIDS  . Recurrent UTI   . Microscopic hematuria     full w/ u unrevealing X 2  . Tobacco dependence in remission     Quit fall 2015.   Only occasional bronchitis illness (CT shest 2009 nl- for f/u of ? nodule on CXR)  . Atypical chest pain 2007; 2015    cardiolyte neg, echo nl, cath showed mild/nonobstructive LAD disease  . Normal nuclear stress test 11/11 and 06/2014    negative, EF normal  . Palpitations 2006    48H holter neg  . Osteoarthritis of knee 06/30/2012    Right   . COPD (chronic obstructive pulmonary disease) ([place])   . Fatty liver     Past Surgical History  Procedure Laterality Date  . Lumbar spine surgery  1993    right iliac crest bone graft+metal instrumentation; 2005 metal removal and decompression, 2011 lumbar decompression 4-11, then stabilization/ instrumentation done 09-19-10: L2,L3, L5 left and L2 , L3, L4 right pedical remnant L4 left embedded. Left iliac crest bone graft-- Dr Velna Ochs  . Appendectomy  1984  . Abdominal hysterectomy  1997    DUB  . Oophorectomy Right     cyst  . Tonsillectomy  60 yrs old  . Knee arthroscopy Bilateral   . Left wrist ganglion cyst excision  2010  . Tonsillectomy    . Cardiac catheterization  10/09/2005    no CAD, mildly elevated LVEDP, normal LV function (Dr. Gerrie Nordmann)  . Cardiovascular stress test  06/2014  normal lexiscan NST  . Transthoracic echocardiogram  2006    EF=>55%, trace MR, mild TR, trace AV regurg, trace pulm valve regurg   . Cardiovascular stress test  2006    persantine - no ischemia, low risk   . Cardiac catheterization N/A 05/16/2015    Mild non-obstructive CAD--med mgmt.  Procedure: Left Heart Cath and Coronary Angiography;  Surgeon: Pixie Casino, MD;  Location: [facility];  Service: Cardiovascular;  Laterality: N/A;  . Lumbar spine surgery  02/10/2016    Dr. Velna Ochs: removal of hardware and decompression    Family History  Problem Relation Age of Onset  . Coronary artery disease Neg Hx   . Heart Problems Mother     and thyroid problems  . Hyperlipidemia Mother   . Diabetes Mother   . Breast cancer Mother   . Heart failure  Father     CHF, heart attack, diabetes, hyperlipidemia  . Heart disease Brother     back problems  . Hypertension Brother   . Cancer Paternal Grandmother     mouth - snuff  . Heart attack Paternal Grandfather     stroke, HTN  . Hyperlipidemia Brother   . Heart attack Brother   . Cancer Brother   . Stroke Maternal Grandfather   . Kidney failure Maternal Grandmother     Social History   Social History  . Marital Status: Married    Spouse Name: N/A  . Number of Children: N/A  . Years of Education: N/A   Occupational History  . Not on file.   Social History Main Topics  . Smoking status: Former Smoker -- 1.00 packs/day for 35 years    Types: Cigarettes    Quit date: 07/22/2014  . Smokeless tobacco: Never Used     Comment: down to ~2 cigarettes/daily (06/23/14) - Quit Smoking around 07/20/14!  Marland Kitchen Alcohol Use: No  . Drug Use: No  . Sexual Activity:    Partners: Male    Birth Control/ Protection: Surgical   Other Topics Concern  . Not on file   Social History Narrative    Outpatient Prescriptions Prior to Visit  Medication Sig Dispense Refill  . acetaminophen (TYLENOL) 500 MG tablet Take 500 mg by mouth every 6 (six) hours as needed (pain).    Marland Kitchen atenolol (TENORMIN) 25 MG tablet Take 1 tablet (25 mg total) by mouth 2 (two) times daily. 60 tablet 6  . clopidogrel (PLAVIX) 75 MG tablet Take 1 tablet (75 mg total) by mouth daily. 90 tablet 3  . cyclobenzaprine (FLEXERIL) 10 MG tablet take 1 tablet by mouth every 8 hours if needed 30 tablet 6  . fenofibrate 160 MG tablet take 1 tablet by mouth once daily with food 30 tablet 12  . isosorbide mononitrate (IMDUR) 30 MG 24 hr tablet Take 1 tablet (30 mg total) by mouth daily. 90 tablet 3  . LORazepam (ATIVAN) 1 MG tablet take 1 tablet by mouth twice a day 60 tablet 5  . pantoprazole (PROTONIX) 40 MG tablet Take 1 tablet (40 mg total) by mouth daily. 30 tablet 11  . rosuvastatin (CRESTOR) 20 MG tablet Take 1 tablet (20 mg total) by  mouth daily. 30 tablet 11  . HYDROcodone-acetaminophen (NORCO/VICODIN) 5-325 MG tablet take 1 tablet by mouth every 6 hours if needed for pain 90 tablet 0  . albuterol (PROVENTIL HFA;VENTOLIN HFA) 108 (90 BASE) MCG/ACT inhaler Inhale into the lungs every 4 (four) hours as needed for wheezing or shortness  of breath (bronchitis). Reported on 03/12/2016    . albuterol (PROVENTIL) (2.5 MG/3ML) 0.083% nebulizer solution Take 3 mLs (2.5 mg total) by nebulization every 4 (four) hours as needed for wheezing or shortness of breath. (Patient not taking: Reported on 03/12/2016) 75 mL 1  . nitroGLYCERIN (NITROSTAT) 0.4 MG SL tablet Place 1 tablet (0.4 mg total) under the tongue every 5 (five) minutes as needed for chest pain (MAX 3 doses.). (Patient not taking: Reported on 03/12/2016) 25 tablet 3  . oxyCODONE-acetaminophen (PERCOCET) 10-325 MG tablet 1/2-1 tab po q6h prn severe pain 60 tablet 0   No facility-administered medications prior to visit.    Allergies  Allergen Reactions  . Aspirin Other (See Comments)     GI Bleed  . Beta Adrenergic Blockers Other (See Comments)    REACTION: decreased libido  . Nsaids Other (See Comments)     GI Upset  . Penicillins Swelling  . Prochlorperazine Edisylate Other (See Comments)     Stroke like symptoms  . Sulfonamide Derivatives Other (See Comments)    Unknown allergic reaction  . Amitriptyline Hcl Palpitations     increased heart rate    ROS Review of Systems  Constitutional: Positive for fever. Negative for chills, appetite change and fatigue.  HENT: Negative for congestion, dental problem, ear pain and sore throat.   Eyes: Negative for discharge, redness and visual disturbance.  Respiratory: Negative for cough, chest tightness, shortness of breath and wheezing.   Cardiovascular: Negative for chest pain, palpitations and leg swelling.  Gastrointestinal: Negative for nausea, vomiting, abdominal pain, diarrhea and blood in stool.  Genitourinary: Negative for  dysuria, urgency, frequency, hematuria, flank pain and difficulty urinating.  Musculoskeletal: Positive for back pain. Negative for myalgias, joint swelling, arthralgias and neck stiffness.  Skin: Negative for pallor and rash.  Neurological: Negative for dizziness, speech difficulty, weakness and headaches.  Hematological: Negative for adenopathy. Does not bruise/bleed easily.  Psychiatric/Behavioral: Negative for confusion and sleep disturbance. The patient is not nervous/anxious.     PE; Blood pressure 135/76, pulse 65, temperature 97.9 F (36.6 C), temperature source Oral, height 5' 4.5" (1.638 m), weight 188 lb 6.4 oz (85.458 kg), SpO2 94 %. Gen: Alert, well appearing.  Patient is oriented to person, place, time, and situation. [id number]: no injection, icteris, swelling, or exudate.  EOMI, PERRLA. Mouth: lips without lesion/swelling.  Oral mucosa pink and moist. Oropharynx without erythema, exudate, or swelling.  CV: RRR, no m/r/g.   LUNGS: CTA bilat, nonlabored resps, good aeration in all lung fields. ABD: soft, NT, ND, BS normal.  No hepatospenomegaly or mass.  No bruits. EXT: no clubbing, cyanosis, or edema.  BACK: midline lumbar scar looks good.  No tenderness to palpation in midline lumbar region.  Pertinent labs:  Lab Results  Component Value Date   TSH 0.807 05/10/2015   Lab Results  Component Value Date   WBC 8.3 05/10/2015   HGB 12.8 05/10/2015   HCT 38.1 05/10/2015   MCV 92.0 05/10/2015   PLT 203 05/10/2015   Lab Results  Component Value Date   CREATININE 0.74 05/10/2015   BUN 16 05/10/2015   NA 142 05/10/2015   K 4.0 05/10/2015   CL 110 05/10/2015   CO2 28 05/10/2015   Lab Results  Component Value Date   ALT 22 03/10/2015   AST 21 03/10/2015   ALKPHOS 51 03/10/2015   BILITOT 0.6 03/10/2015   Lab Results  Component Value Date   CHOL 154 03/10/2015   Lab Results  Component Value Date   HDL 47.80 03/10/2015   Lab Results  Component Value Date    LDLCALC 83 03/10/2015   Lab Results  Component Value Date   TRIG 114.0 03/10/2015   Lab Results  Component Value Date   CHOLHDL 3 03/10/2015   Lab Results  Component Value Date   HGBA1C 6.0 11/11/2014   CC UA today: trace intact blood, otherwise normal.  ASSESSMENT AND PLAN:   1) Chronic low back pain, s/p recent surgery and is improved--back to her baseline chronic LBP.  Will try to get records from [facility]. I printed rx's for vicodin 5/325, 1 tab tid prn, #90 today for this month, June, and July 2017.  Appropriate fill on/after date was noted on each rx.  2) Post-op fever: she appears well today.  Sounds like fevers going away per her report. Will send urine for c/s and I told her to go ahead and stop the cipro that she has been on for 1 mo. No sign of pneumonia, mastitis, wound infection, or DVT.  3) COPD: mild.  Doing well at this time, not requiring rescue albut much at all and she has successfully quit smoking.  4) Hyperlipidemia: Tolerating statin.  Recheck FLP and AST/ALT today.  5) Hx of prediabetes: check HbA1c today.  6) Preventative health care: screening for Hep C and HIV done today.   Forgot to give pt hemoccult ICT so I'll do this at next f/u visit. She declines mammography. Not a candidate for cervical ca screening.  An After Visit Summary was printed and given to the patient.  FOLLOW UP:  Return in about 3 months (around 06/12/2016) for routine chronic illness f/u.  Signed:  Crissie Sickles, MD           03/13/2016

## 2016-03-13 LAB — URINE CULTURE: Colony Count: 70000

## 2016-03-13 LAB — HEPATITIS C ANTIBODY: HCV AB: NEGATIVE

## 2016-03-15 ENCOUNTER — Telehealth: Payer: Self-pay

## 2016-03-15 NOTE — Telephone Encounter (Signed)
Called patient to give lab results. No answer. 

## 2016-03-15 NOTE — Telephone Encounter (Signed)
-----   Message from Tammi Sou, MD sent at 03/14/2016  4:53 PM EDT ----- Urine specimen did not show infection. Ask patient if she continues to have any fevers since I saw her in the office recently.-thx

## 2016-03-19 ENCOUNTER — Encounter: Payer: Self-pay | Admitting: Family Medicine

## 2016-04-11 ENCOUNTER — Other Ambulatory Visit: Payer: Self-pay | Admitting: Family Medicine

## 2016-06-11 ENCOUNTER — Encounter: Payer: Self-pay | Admitting: Family Medicine

## 2016-06-11 ENCOUNTER — Other Ambulatory Visit: Payer: Self-pay | Admitting: Family Medicine

## 2016-06-11 ENCOUNTER — Ambulatory Visit (INDEPENDENT_AMBULATORY_CARE_PROVIDER_SITE_OTHER): Payer: Medicare Other | Admitting: Family Medicine

## 2016-06-11 ENCOUNTER — Ambulatory Visit: Payer: Medicare Other | Admitting: Family Medicine

## 2016-06-11 VITALS — BP 147/83 | HR 83 | Temp 98.2°F | Resp 20 | Wt 191.2 lb

## 2016-06-11 DIAGNOSIS — Z9889 Other specified postprocedural states: Secondary | ICD-10-CM

## 2016-06-11 DIAGNOSIS — G8929 Other chronic pain: Secondary | ICD-10-CM

## 2016-06-11 DIAGNOSIS — M5136 Other intervertebral disc degeneration, lumbar region: Secondary | ICD-10-CM | POA: Diagnosis not present

## 2016-06-11 DIAGNOSIS — M549 Dorsalgia, unspecified: Principal | ICD-10-CM

## 2016-06-11 DIAGNOSIS — M5442 Lumbago with sciatica, left side: Secondary | ICD-10-CM

## 2016-06-11 DIAGNOSIS — Z7189 Other specified counseling: Secondary | ICD-10-CM

## 2016-06-11 DIAGNOSIS — F1190 Opioid use, unspecified, uncomplicated: Secondary | ICD-10-CM

## 2016-06-11 MED ORDER — HYDROCODONE-ACETAMINOPHEN 5-325 MG PO TABS: ORAL_TABLET | ORAL | 0 refills | Status: DC

## 2016-06-11 MED ORDER — HYDROCODONE-ACETAMINOPHEN 5-325 MG PO TABS
ORAL_TABLET | ORAL | 0 refills | Status: DC
Start: 2016-06-11 — End: 2016-09-13

## 2016-06-11 NOTE — Progress Notes (Signed)
Office Note 06/11/2016  CC:  Encounter for chronic pain management  HPI:  Brenda [name] is a 60 y.o. White female  present for chronic pain management. Patient's PCP was unavailable, and he had to reschedule patient for her appointment. However patient would run out of medicine before PCPs returned so she was approved to rescheduled today on this provider scheduled. Patient states she has had back pain for greater than 25 years after a work injury. She's had multiple back surgeries and a spinal fusion. She recently had repeat decompressive surgery approximately one month ago. Patient reports prior to the surgery, even with the narcotic medications it was difficult to have normal quality of place and function. However after her recent back surgery, she states with the medication, she is having a better quality of life and she is able to function physically more. Patient reports low lumbar back pain, with radiation to her left lower extremity.   Past Medical History:  Diagnosis Date  . Atypical chest pain 2007; 2015   cardiolyte neg, echo nl, cath showed mild/nonobstructive LAD disease  . Chronic LBP    Multiple surgeries  . COPD (chronic obstructive pulmonary disease) ([place])   . DDD (degenerative disc disease)    spinal stenosis  . Fatty liver   . History of GI bleed    NSAIDS  . Hyperlipidemia    mixed  . Microscopic hematuria    full w/ u unrevealing X 2  . Normal nuclear stress test 11/11 and 06/2014   negative, EF normal  . Osteoarthritis of knee 06/30/2012   Right   . Palpitations 2006   48H holter neg  . Recurrent UTI   . Tobacco dependence in remission    Quit fall 2015.  Only occasional bronchitis illness (CT shest 2009 nl- for f/u of ? nodule on CXR)    Past Surgical History:  Procedure Laterality Date  . ABDOMINAL HYSTERECTOMY  1997   DUB  . APPENDECTOMY  1984  . CARDIAC CATHETERIZATION  10/09/2005   no CAD, mildly elevated LVEDP, normal LV function (Dr. Gerrie Nordmann)  . CARDIAC CATHETERIZATION N/A 05/16/2015   Mild non-obstructive CAD--med mgmt.  Procedure: Left Heart Cath and Coronary Angiography;  Surgeon: Pixie Casino, MD;  Location: [facility];  Service: Cardiovascular;  Laterality: N/A;  . CARDIOVASCULAR STRESS TEST  06/2014   normal lexiscan NST  . CARDIOVASCULAR STRESS TEST  2006   persantine - no ischemia, low risk   . KNEE ARTHROSCOPY Bilateral   . left wrist ganglion cyst excision  2010  . [place]   right iliac crest bone graft+metal instrumentation; 2005 metal removal and decompression, 2011 lumbar decompression 4-11, then stabilization/ instrumentation done 09-19-10: L2,L3, L5 left and L2 , L3, L4 right pedical remnant L4 left embedded. Left iliac crest bone graft-- Dr Velna Ochs  . LUMBAR SPINE SURGERY  02/10/2016   Dr. Velna Ochs: lumbar decompression, instrumentation removal, and fusion exploration--HELPED her a lot, esp her radicular leg pains.  . OOPHORECTOMY Right    cyst  . TONSILLECTOMY  60 yrs old  . TONSILLECTOMY    . TRANSTHORACIC ECHOCARDIOGRAM  2006   EF=>55%, trace MR, mild TR, trace AV regurg, trace pulm valve regurg     Family History  Problem Relation Age of Onset  . Heart Problems Mother     and thyroid problems  . Hyperlipidemia Mother   . Diabetes Mother   . Breast cancer Mother   . Heart  failure Father     CHF, heart attack, diabetes, hyperlipidemia  . Heart disease Brother     back problems  . Hypertension Brother   . Kidney failure Maternal Grandmother   . Stroke Maternal Grandfather   . Cancer Paternal Grandmother     mouth - snuff  . Heart attack Paternal Grandfather     stroke, HTN  . Hyperlipidemia Brother   . Heart attack Brother   . Cancer Brother   . Coronary artery disease Neg Hx     Social History   Social History  . Marital status: Married    Spouse name: N/A  . Number of children: N/A  . Years of education: N/A   Occupational History  . Not on file.    Social History Main Topics  . Smoking status: Former Smoker    Packs/day: 1.00    Years: 35.00    Types: Cigarettes    Quit date: 07/22/2014  . Smokeless tobacco: Never Used     Comment: down to ~2 cigarettes/daily (06/23/14) - Quit Smoking around 07/20/14!  Marland Kitchen Alcohol use No  . Drug use: No  . Sexual activity: Yes    Partners: Male    Birth control/ protection: Surgical   Other Topics Concern  . Not on file   Social History Narrative  . No narrative on file    Outpatient Medications Prior to Visit  Medication Sig Dispense Refill  . acetaminophen (TYLENOL) 500 MG tablet Take 500 mg by mouth every 6 (six) hours as needed (pain).    Marland Kitchen albuterol (PROVENTIL HFA;VENTOLIN HFA) 108 (90 BASE) MCG/ACT inhaler Inhale into the lungs every 4 (four) hours as needed for wheezing or shortness of breath (bronchitis). Reported on 03/12/2016    . albuterol (PROVENTIL) (2.5 MG/3ML) 0.083% nebulizer solution Take 3 mLs (2.5 mg total) by nebulization every 4 (four) hours as needed for wheezing or shortness of breath. 75 mL 1  . atenolol (TENORMIN) 25 MG tablet Take 1 tablet (25 mg total) by mouth 2 (two) times daily. 60 tablet 6  . clopidogrel (PLAVIX) 75 MG tablet Take 1 tablet (75 mg total) by mouth daily. 90 tablet 3  . cyclobenzaprine (FLEXERIL) 10 MG tablet take 1 tablet every 8 hours if needed 30 tablet 6  . fenofibrate 160 MG tablet take 1 tablet by mouth once daily with food 30 tablet 12  . HYDROcodone-acetaminophen (NORCO/VICODIN) 5-325 MG tablet take 1 tablet by mouth every 6 hours if needed for pain 90 tablet 0  . isosorbide mononitrate (IMDUR) 30 MG 24 hr tablet Take 1 tablet (30 mg total) by mouth daily. 90 tablet 3  . LORazepam (ATIVAN) 1 MG tablet take 1 tablet by mouth twice a day 60 tablet 5  . nitroGLYCERIN (NITROSTAT) 0.4 MG SL tablet Place 1 tablet (0.4 mg total) under the tongue every 5 (five) minutes as needed for chest pain (MAX 3 doses.). 25 tablet 3  . pantoprazole (PROTONIX) 40  MG tablet Take 1 tablet (40 mg total) by mouth daily. 30 tablet 11  . rosuvastatin (CRESTOR) 20 MG tablet Take 1 tablet (20 mg total) by mouth daily. 30 tablet 11   No facility-administered medications prior to visit.     Allergies  Allergen Reactions  . Aspirin Other (See Comments)     GI Bleed  . Beta Adrenergic Blockers Other (See Comments)    REACTION: decreased libido  . Nsaids Other (See Comments)     GI Upset  . Penicillins Swelling  .  Prochlorperazine Edisylate Other (See Comments)     Stroke like symptoms  . Sulfonamide Derivatives Other (See Comments)    Unknown allergic reaction  . Amitriptyline Hcl Palpitations     increased heart rate    ROS Review of Systems  Constitutional: Negative for appetite change, chills and fatigue.  HENT: Negative for congestion, dental problem, ear pain and sore throat.   Eyes: Negative for discharge, redness and visual disturbance.  Respiratory: Negative for cough, chest tightness, shortness of breath and wheezing.   Cardiovascular: Negative for chest pain, palpitations and leg swelling.  Gastrointestinal: Negative for abdominal pain, blood in stool, diarrhea, nausea and vomiting.  Genitourinary: Negative for difficulty urinating, dysuria, flank pain, frequency, hematuria and urgency.  Musculoskeletal: Positive for back pain. Negative for arthralgias, joint swelling, myalgias and neck stiffness.  Skin: Negative for pallor and rash.  Neurological: Negative for dizziness, speech difficulty, weakness and headaches.  Hematological: Negative for adenopathy. Does not bruise/bleed easily.  Psychiatric/Behavioral: Negative for confusion and sleep disturbance. The patient is not nervous/anxious.     PE; Blood pressure (!) 147/83, pulse 83, temperature 98.2 F (36.8 C), temperature source Oral, resp. rate 20, weight 191 lb 4 oz (86.8 kg), SpO2 95 %. Gen: Alert, well appearing.  Patient is oriented to person, place, time, and  situation. [id number]: no injection, icteris, swelling, or exudate.  EOMI, PERRLA. Mouth: lips without lesion/swelling.  Oral mucosa pink and moist. Oropharynx without erythema, exudate, or swelling.  CV: RRR, no m/r/g.   LUNGS: CTA bilat, nonlabored resps, good aeration in all lung fields. ABD: soft, NT, ND, BS normal.  No hepatospenomegaly or mass.  No bruits. EXT: no clubbing, cyanosis, or edema.  BACK: midline lumbar scar looks good.  No tenderness to palpation in midline lumbar region.   ASSESSMENT AND PLAN:  Encounter for chronic pain management Degenerative disc disease, lumbar/Chronic bilateral low back pain with left-sided sciatica/ H/O Spinal surgery Chronic narcotic use - Patient reports increased quality of life with use of Vicodin. She seems appropriate in her use of medication. She has had some relief from her recent decompression surgery, that she states now with the use of medication her pain is actually tolerable. - Refill medications in accordance with provider refills by her PCP, Vicodin 5-325, 1 tab 3 times a day when necessary, #90, with 2 additional printed scripts with future date one month apart. - HYDROcodone-acetaminophen (NORCO/VICODIN) 5-325 MG tablet; take 1 tablet by mouth every 8 hours if needed for pain  Dispense: 90 tablet; Refill: 0 - Patient to follow-up with PCP in approximately 3 months for chronic pain management. She is to follow-up with her PCP to discuss chronic medical issues as soon as possible.  > 25 minutes spent with patient, >50% of time spent face to face counseling patient and coordinating care.    An After Visit Summary was printed and given to the patient.  Electronically Signed by: Howard Pouch, DO [facility]

## 2016-06-11 NOTE — Telephone Encounter (Signed)
Patient arrived today for appointment at 3pm but Dr. Anitra Lauth, per patient, message she got to cancel was only to change appointment time not cancel. Patient needed RF of hydrocodone. Last RX was written to fill on or after 05/10/16 Patient did schedule appt with Dr. Anitra Lauth on 06/20/16 Can you please RX enough meds for her to make it to that appointment?  Thank you!

## 2016-06-11 NOTE — Patient Instructions (Signed)
I have prescribed you 3 months of pain medication. Follow up with PCP for other chronic medical conditions.

## 2016-06-11 NOTE — Telephone Encounter (Signed)
Patient must be seen given an appt with Dr Raoul Pitch today @ 3:30

## 2016-06-12 DIAGNOSIS — F1190 Opioid use, unspecified, uncomplicated: Secondary | ICD-10-CM | POA: Insufficient documentation

## 2016-06-12 DIAGNOSIS — G8929 Other chronic pain: Secondary | ICD-10-CM | POA: Insufficient documentation

## 2016-06-20 ENCOUNTER — Ambulatory Visit: Payer: Medicare Other | Admitting: Family Medicine

## 2016-08-13 ENCOUNTER — Other Ambulatory Visit: Payer: Self-pay | Admitting: Family Medicine

## 2016-08-13 NOTE — Telephone Encounter (Signed)
Patient requesting alprazolam and flexeril rfs. Last Alprazolam RX printed 02/08/16 x 5 RF. Last flexeril RX sent on 04/11/16 x 5 rfs.  Please advise.

## 2016-08-14 NOTE — Telephone Encounter (Signed)
Ativan Rx faxed to pharmacy

## 2016-09-13 ENCOUNTER — Encounter: Payer: Self-pay | Admitting: Family Medicine

## 2016-09-13 ENCOUNTER — Ambulatory Visit (INDEPENDENT_AMBULATORY_CARE_PROVIDER_SITE_OTHER): Payer: Medicare Other | Admitting: Family Medicine

## 2016-09-13 VITALS — BP 145/85 | HR 92 | Temp 96.8°F | Resp 16 | Wt 196.8 lb

## 2016-09-13 DIAGNOSIS — G8929 Other chronic pain: Secondary | ICD-10-CM | POA: Diagnosis not present

## 2016-09-13 DIAGNOSIS — Z23 Encounter for immunization: Secondary | ICD-10-CM | POA: Diagnosis not present

## 2016-09-13 DIAGNOSIS — F1190 Opioid use, unspecified, uncomplicated: Secondary | ICD-10-CM | POA: Diagnosis not present

## 2016-09-13 DIAGNOSIS — M5442 Lumbago with sciatica, left side: Secondary | ICD-10-CM

## 2016-09-13 DIAGNOSIS — J449 Chronic obstructive pulmonary disease, unspecified: Secondary | ICD-10-CM

## 2016-09-13 MED ORDER — HYDROCODONE-ACETAMINOPHEN 5-325 MG PO TABS: ORAL_TABLET | ORAL | 0 refills | Status: DC

## 2016-09-13 NOTE — Progress Notes (Signed)
OFFICE VISIT  09/13/2016   CC:  Chief Complaint  Patient presents with  . Follow-up    chronic back pain    HPI:    Patient is a 60 y.o. Caucasian female who presents for f/u chronic low back pain. Most recent urine tox screen was 11/2015 and this had appropriate results. She has not smoke for 2 years now! Breathing feels better.  Not requiring any albuterol at all lately.  Still requiring 3 pain pills in her typical day.  Takes flexeril as well b/c of frequent back muscle spasms, esp hs. The pain meds allow her to function adequately and have normal quaity of life.  Checks bp at home periodically and says it is always <140/90.  Past Medical History:  Diagnosis Date  . Atypical chest pain 2007; 2015   cardiolyte neg, echo nl, cath showed mild/nonobstructive LAD disease  . Chronic LBP    Multiple surgeries  . COPD (chronic obstructive pulmonary disease) ([place])   . DDD (degenerative disc disease)    spinal stenosis  . Fatty liver   . History of GI bleed    NSAIDS  . Hyperlipidemia    mixed  . Microscopic hematuria    full w/ u unrevealing X 2  . Normal nuclear stress test 11/11 and 06/2014   negative, EF normal  . Osteoarthritis of knee 06/30/2012   Right   . Palpitations 2006   48H holter neg  . Recurrent UTI   . Tobacco dependence in remission    Quit fall 2015.  Only occasional bronchitis illness (CT shest 2009 nl- for f/u of ? nodule on CXR)    Past Surgical History:  Procedure Laterality Date  . ABDOMINAL HYSTERECTOMY  1997   DUB  . APPENDECTOMY  1984  . CARDIAC CATHETERIZATION  10/09/2005   no CAD, mildly elevated LVEDP, normal LV function (Dr. Gerrie Nordmann)  . CARDIAC CATHETERIZATION N/A 05/16/2015   Mild non-obstructive CAD--med mgmt.  Procedure: Left Heart Cath and Coronary Angiography;  Surgeon: Pixie Casino, MD;  Location: [facility];  Service: Cardiovascular;  Laterality: N/A;  . CARDIOVASCULAR STRESS TEST  06/2014   normal lexiscan NST  .  CARDIOVASCULAR STRESS TEST  2006   persantine - no ischemia, low risk   . KNEE ARTHROSCOPY Bilateral   . left wrist ganglion cyst excision  2010  . [place]   right iliac crest bone graft+metal instrumentation; 2005 metal removal and decompression, 2011 lumbar decompression 4-11, then stabilization/ instrumentation done 09-19-10: L2,L3, L5 left and L2 , L3, L4 right pedical remnant L4 left embedded. Left iliac crest bone graft-- Dr Velna Ochs  . LUMBAR SPINE SURGERY  02/10/2016   Dr. Velna Ochs: lumbar decompression, instrumentation removal, and fusion exploration--HELPED her a lot, esp her radicular leg pains.  . OOPHORECTOMY Right    cyst  . TONSILLECTOMY  60 yrs old  . TONSILLECTOMY    . TRANSTHORACIC ECHOCARDIOGRAM  2006   EF=>55%, trace MR, mild TR, trace AV regurg, trace pulm valve regurg     Outpatient Medications Prior to Visit  Medication Sig Dispense Refill  . acetaminophen (TYLENOL) 500 MG tablet Take 500 mg by mouth every 6 (six) hours as needed (pain).    Marland Kitchen albuterol (PROVENTIL HFA;VENTOLIN HFA) 108 (90 BASE) MCG/ACT inhaler Inhale into the lungs every 4 (four) hours as needed for wheezing or shortness of breath (bronchitis). Reported on 03/12/2016    . albuterol (PROVENTIL) (2.5 MG/3ML) 0.083% nebulizer solution Take 3  mLs (2.5 mg total) by nebulization every 4 (four) hours as needed for wheezing or shortness of breath. 75 mL 1  . atenolol (TENORMIN) 25 MG tablet Take 1 tablet (25 mg total) by mouth 2 (two) times daily. 60 tablet 6  . clopidogrel (PLAVIX) 75 MG tablet Take 1 tablet (75 mg total) by mouth daily. 90 tablet 3  . cyclobenzaprine (FLEXERIL) 10 MG tablet take 1 tablet by mouth every 8 hours if needed 30 tablet 6  . fenofibrate 160 MG tablet take 1 tablet by mouth once daily with food 30 tablet 12  . isosorbide mononitrate (IMDUR) 30 MG 24 hr tablet Take 1 tablet (30 mg total) by mouth daily. 90 tablet 3  . LORazepam (ATIVAN) 1 MG tablet take 1 tablet by mouth  twice a day 60 tablet 5  . nitroGLYCERIN (NITROSTAT) 0.4 MG SL tablet Place 1 tablet (0.4 mg total) under the tongue every 5 (five) minutes as needed for chest pain (MAX 3 doses.). 25 tablet 3  . pantoprazole (PROTONIX) 40 MG tablet Take 1 tablet (40 mg total) by mouth daily. 30 tablet 11  . rosuvastatin (CRESTOR) 20 MG tablet Take 1 tablet (20 mg total) by mouth daily. 30 tablet 11  . HYDROcodone-acetaminophen (NORCO/VICODIN) 5-325 MG tablet take 1 tablet by mouth every 8 hours if needed for pain 90 tablet 0   No facility-administered medications prior to visit.     Allergies  Allergen Reactions  . Aspirin Other (See Comments)     GI Bleed  . Beta Adrenergic Blockers Other (See Comments)    REACTION: decreased libido  . Nsaids Other (See Comments)     GI Upset  . Penicillins Swelling  . Prochlorperazine Edisylate Other (See Comments)     Stroke like symptoms  . Sulfonamide Derivatives Other (See Comments)    Unknown allergic reaction  . Amitriptyline Hcl Palpitations     increased heart rate    ROS As per HPI  PE: Blood pressure (!) 145/85, pulse 92, temperature (!) 96.8 F (36 C), temperature source Temporal, resp. rate 16, weight 196 lb 12.8 oz (89.3 kg), SpO2 97 %. Gen: Alert, well appearing.  Patient is oriented to person, place, time, and situation. AFFECT: pleasant, lucid thought and speech. NECK: no adenopathy CV: RRR, no m/r/g.   LUNGS: CTA bilat, nonlabored resps, good aeration in all lung fields. EXT: no clubbing, cyanosis, or edema.   LABS:  Lab Results  Component Value Date   TSH 0.807 05/10/2015   Lab Results  Component Value Date   WBC 8.3 05/10/2015   HGB 12.8 05/10/2015   HCT 38.1 05/10/2015   MCV 92.0 05/10/2015   PLT 203 05/10/2015   Lab Results  Component Value Date   CREATININE 0.66 03/12/2016   BUN 20 03/12/2016   NA 145 03/12/2016   K 4.3 03/12/2016   CL 104 03/12/2016   CO2 33 (H) 03/12/2016   Lab Results  Component Value Date    ALT 10 03/12/2016   AST 11 03/12/2016   ALKPHOS 47 03/12/2016   BILITOT 0.4 03/12/2016   Lab Results  Component Value Date   CHOL 126 03/12/2016   Lab Results  Component Value Date   HDL 41.70 03/12/2016   Lab Results  Component Value Date   LDLCALC 56 03/12/2016   Lab Results  Component Value Date   TRIG 144.0 03/12/2016   Lab Results  Component Value Date   CHOLHDL 3 03/12/2016   Lab Results  Component  Value Date   HGBA1C 6.0 03/12/2016   IMPRESSION AND PLAN:  1) Chronic low back pain: hx of multiple surgeries.  On chronic pain med regimen to maintain function/quality of life.  No hx of any sign of drug abuse/misuse or diversion. Plan on repeating urine tox screen at next f/u in 3 mo. I printed rx's for vicodin 5/325, 1 tid prn, #90 today for the next 3 months.  Appropriate fill on/after date was noted on each rx.  2) COPD. Feeling better since she quit smoking 2 yrs ago. Continue prn albuterol. Flu vaccine today.  An After Visit Summary was printed and given to the patient.  FOLLOW UP: Return in about 3 months (around 12/14/2016) for routine chronic illness f/u.  Signed:  Crissie Sickles, MD           09/13/2016

## 2016-09-13 NOTE — Progress Notes (Signed)
Pre visit review using our clinic review tool, if applicable. No additional management support is needed unless otherwise documented below in the visit note. 

## 2016-11-11 ENCOUNTER — Other Ambulatory Visit: Payer: Self-pay | Admitting: Family Medicine

## 2016-12-02 ENCOUNTER — Other Ambulatory Visit: Payer: Self-pay | Admitting: Family Medicine

## 2016-12-06 ENCOUNTER — Other Ambulatory Visit: Payer: Self-pay | Admitting: Family Medicine

## 2016-12-13 ENCOUNTER — Encounter: Payer: Self-pay | Admitting: Family Medicine

## 2016-12-13 ENCOUNTER — Ambulatory Visit (INDEPENDENT_AMBULATORY_CARE_PROVIDER_SITE_OTHER): Payer: Medicare Other | Admitting: Family Medicine

## 2016-12-13 VITALS — BP 124/80 | HR 78 | Temp 98.4°F | Resp 16 | Ht 64.5 in | Wt 196.8 lb

## 2016-12-13 DIAGNOSIS — E782 Mixed hyperlipidemia: Secondary | ICD-10-CM | POA: Diagnosis not present

## 2016-12-13 DIAGNOSIS — G8929 Other chronic pain: Secondary | ICD-10-CM

## 2016-12-13 DIAGNOSIS — Z79891 Long term (current) use of opiate analgesic: Secondary | ICD-10-CM | POA: Diagnosis not present

## 2016-12-13 DIAGNOSIS — K760 Fatty (change of) liver, not elsewhere classified: Secondary | ICD-10-CM

## 2016-12-13 DIAGNOSIS — Z79899 Other long term (current) drug therapy: Secondary | ICD-10-CM | POA: Diagnosis not present

## 2016-12-13 DIAGNOSIS — F1190 Opioid use, unspecified, uncomplicated: Secondary | ICD-10-CM | POA: Diagnosis not present

## 2016-12-13 DIAGNOSIS — M5442 Lumbago with sciatica, left side: Secondary | ICD-10-CM | POA: Diagnosis not present

## 2016-12-13 LAB — COMPREHENSIVE METABOLIC PANEL
ALT: 19 U/L (ref 0–35)
AST: 18 U/L (ref 0–37)
Albumin: 4.4 g/dL (ref 3.5–5.2)
Alkaline Phosphatase: 55 U/L (ref 39–117)
BILIRUBIN TOTAL: 0.5 mg/dL (ref 0.2–1.2)
BUN: 12 mg/dL (ref 6–23)
CO2: 31 meq/L (ref 19–32)
CREATININE: 0.57 mg/dL (ref 0.40–1.20)
Calcium: 9.9 mg/dL (ref 8.4–10.5)
Chloride: 104 mEq/L (ref 96–112)
GFR: 114.79 mL/min (ref >60.00)
GLUCOSE: 118 mg/dL — AB (ref 70–99)
Potassium: 4.6 mEq/L (ref 3.5–5.1)
SODIUM: 141 meq/L (ref 135–145)
Total Protein: 7.2 g/dL (ref 6.0–8.3)

## 2016-12-13 LAB — LIPID PANEL
Cholesterol: 178 mg/dL (ref 0–200)
HDL: 39.9 mg/dL (ref >39.00)
LDL Cholesterol: 112 mg/dL — ABNORMAL HIGH (ref 0–99)
NonHDL: 137.6
Total CHOL/HDL Ratio: 4
Triglycerides: 129 mg/dL (ref 0.0–149.0)
VLDL: 25.8 mg/dL (ref 0.0–40.0)

## 2016-12-13 MED ORDER — FENOFIBRATE 160 MG PO TABS: ORAL_TABLET | ORAL | 3 refills | Status: DC

## 2016-12-13 MED ORDER — HYDROCODONE-ACETAMINOPHEN 5-325 MG PO TABS: ORAL_TABLET | ORAL | 0 refills | Status: DC

## 2016-12-13 MED ORDER — PANTOPRAZOLE SODIUM 40 MG PO TBEC: 40.0000 mg | DELAYED_RELEASE_TABLET | Freq: Every day | ORAL | 3 refills | Status: DC

## 2016-12-13 NOTE — Progress Notes (Signed)
Pre visit review using our clinic review tool, if applicable. No additional management support is needed unless otherwise documented below in the visit note. 

## 2016-12-13 NOTE — Progress Notes (Signed)
OFFICE VISIT  12/13/2016   CC:  Chief Complaint  Patient presents with  . Follow-up    RCI, pt is fasting.    HPI:    Patient is a 61 y.o. Caucasian female who presents for 3 mo f/u chronic low back pain requiring chronic narcotic pain med for optimal functioning.  Says the pain is worse with the cold weather, she takes her usual 3 vicodin tabs per day.  Sometimes she takes an additional 500mg  tylenol 1-2 times most days. Most recent vicodin was today.  Most recent lorazepam and flexeril were last night.  Also hx of mixed hyperlipidemia in addition to hepatic steatosis.  Taking crestor and fenofibrate daily, denies side effects.  Indication for chronic opioid: see above. Medication and dose: Vicodin 5/325, 1 tab q8h prn pain # pills per month: 90 Last UDS date: 11/14/15 Pain contract signed: yes; 08/13/13. Date narcotic database last reviewed today, 12/13/16--no red flags.   Past Medical History:  Diagnosis Date  . Atypical chest pain 2007; 2015   cardiolyte neg, echo nl, cath showed mild/nonobstructive LAD disease  . Chronic LBP    Multiple surgeries  . COPD (chronic obstructive pulmonary disease) ([place])   . DDD (degenerative disc disease)    spinal stenosis  . Fatty liver   . History of GI bleed    NSAIDS  . Hyperlipidemia    mixed  . Microscopic hematuria    full w/ u unrevealing X 2  . Normal nuclear stress test 11/11 and 06/2014   negative, EF normal  . Osteoarthritis of knee 06/30/2012   Right   . Palpitations 2006   48H holter neg  . Recurrent UTI   . Tobacco dependence in remission    Quit fall 2015.  Only occasional bronchitis illness (CT shest 2009 nl- for f/u of ? nodule on CXR)    Past Surgical History:  Procedure Laterality Date  . ABDOMINAL HYSTERECTOMY  1997   DUB  . APPENDECTOMY  1984  . CARDIAC CATHETERIZATION  10/09/2005   no CAD, mildly elevated LVEDP, normal LV function (Dr. Gerrie Nordmann)  . CARDIAC CATHETERIZATION N/A 05/16/2015   Mild  non-obstructive CAD--med mgmt.  Procedure: Left Heart Cath and Coronary Angiography;  Surgeon: Pixie Casino, MD;  Location: [facility];  Service: Cardiovascular;  Laterality: N/A;  . CARDIOVASCULAR STRESS TEST  06/2014   normal lexiscan NST  . CARDIOVASCULAR STRESS TEST  2006   persantine - no ischemia, low risk   . KNEE ARTHROSCOPY Bilateral   . left wrist ganglion cyst excision  2010  . [place]   right iliac crest bone graft+metal instrumentation; 2005 metal removal and decompression, 2011 lumbar decompression 4-11, then stabilization/ instrumentation done 09-19-10: L2,L3, L5 left and L2 , L3, L4 right pedical remnant L4 left embedded. Left iliac crest bone graft-- Dr Velna Ochs  . LUMBAR SPINE SURGERY  02/10/2016   Dr. Velna Ochs: lumbar decompression, instrumentation removal, and fusion exploration--HELPED her a lot, esp her radicular leg pains.  . OOPHORECTOMY Right    cyst  . TONSILLECTOMY  61 yrs old  . TONSILLECTOMY    . TRANSTHORACIC ECHOCARDIOGRAM  2006   EF=>55%, trace MR, mild TR, trace AV regurg, trace pulm valve regurg     Outpatient Medications Prior to Visit  Medication Sig Dispense Refill  . acetaminophen (TYLENOL) 500 MG tablet Take 500 mg by mouth every 6 (six) hours as needed (pain).    Marland Kitchen albuterol (PROVENTIL HFA;VENTOLIN HFA) 108 (  90 BASE) MCG/ACT inhaler Inhale into the lungs every 4 (four) hours as needed for wheezing or shortness of breath (bronchitis). Reported on 03/12/2016    . albuterol (PROVENTIL) (2.5 MG/3ML) 0.083% nebulizer solution Take 3 mLs (2.5 mg total) by nebulization every 4 (four) hours as needed for wheezing or shortness of breath. 75 mL 1  . atenolol (TENORMIN) 25 MG tablet Take 1 tablet (25 mg total) by mouth 2 (two) times daily. 60 tablet 6  . clopidogrel (PLAVIX) 75 MG tablet Take 1 tablet (75 mg total) by mouth daily. 90 tablet 3  . cyclobenzaprine (FLEXERIL) 10 MG tablet take 1 tablet every 8 hours if needed 30 tablet 6  .  isosorbide mononitrate (IMDUR) 30 MG 24 hr tablet Take 1 tablet (30 mg total) by mouth daily. 90 tablet 3  . LORazepam (ATIVAN) 1 MG tablet take 1 tablet by mouth twice a day 60 tablet 5  . nitroGLYCERIN (NITROSTAT) 0.4 MG SL tablet Place 1 tablet (0.4 mg total) under the tongue every 5 (five) minutes as needed for chest pain (MAX 3 doses.). 25 tablet 3  . rosuvastatin (CRESTOR) 20 MG tablet take 1 tablet by mouth once daily 90 tablet 0  . fenofibrate 160 MG tablet take 1 tablet by mouth once daily with food 30 tablet 12  . HYDROcodone-acetaminophen (NORCO/VICODIN) 5-325 MG tablet take 1 tablet by mouth every 8 hours if needed for pain 90 tablet 0  . pantoprazole (PROTONIX) 40 MG tablet take 1 tablet by mouth once daily 30 tablet 11   No facility-administered medications prior to visit.     Allergies  Allergen Reactions  . Aspirin Other (See Comments)     GI Bleed  . Beta Adrenergic Blockers Other (See Comments)    REACTION: decreased libido  . Nsaids Other (See Comments)     GI Upset  . Penicillins Swelling  . Prochlorperazine Edisylate Other (See Comments)     Stroke like symptoms  . Sulfonamide Derivatives Other (See Comments)    Unknown allergic reaction  . Amitriptyline Hcl Palpitations     increased heart rate    ROS As per HPI  PE: Blood pressure 124/80, pulse 78, temperature 98.4 F (36.9 C), temperature source Oral, resp. rate 16, height 5' 4.5" (1.638 m), weight 196 lb 12 oz (89.2 kg), SpO2 93 %. Gen: Alert, well appearing.  Patient is oriented to person, place, time, and situation. AFFECT: pleasant, lucid thought and speech. CV: RRR, no m/r/g.   LUNGS: CTA bilat, nonlabored resps, good aeration in all lung fields. EXT: no clubbing, cyanosis, or edema.   LABS:  Lab Results  Component Value Date   TSH 0.807 05/10/2015   Lab Results  Component Value Date   WBC 8.3 05/10/2015   HGB 12.8 05/10/2015   HCT 38.1 05/10/2015   MCV 92.0 05/10/2015   PLT 203  05/10/2015   Lab Results  Component Value Date   CREATININE 0.66 03/12/2016   BUN 20 03/12/2016   NA 145 03/12/2016   K 4.3 03/12/2016   CL 104 03/12/2016   CO2 33 (H) 03/12/2016   Lab Results  Component Value Date   ALT 10 03/12/2016   AST 11 03/12/2016   ALKPHOS 47 03/12/2016   BILITOT 0.4 03/12/2016   Lab Results  Component Value Date   CHOL 126 03/12/2016   Lab Results  Component Value Date   HDL 41.70 03/12/2016   Lab Results  Component Value Date   LDLCALC 56 03/12/2016  Lab Results  Component Value Date   TRIG 144.0 03/12/2016   Lab Results  Component Value Date   CHOLHDL 3 03/12/2016   Lab Results  Component Value Date   HGBA1C 6.0 03/12/2016   IMPRESSION AND PLAN:  1) Chronic pain syndrome: osteoarthritis/DDD lumbar spine, hx of multiple surgeries. The current medical regimen is effective;  continue present plan and medications. I printed rx's for vicodin 5/325, 1 tab tid prn, #90 today for this month, March 2018, and April 2018.  Appropriate fill on/after date was noted on each rx. Urine toxicology screening was done today.  2) Mixed hyperlipidemia: tolerating crestor and fenofibrate. Check FLP and CMET today.  An After Visit Summary was printed and given to the patient.  FOLLOW UP: Return in about 3 months (around 03/12/2017) for routine chronic illness f/u.  Signed:  Crissie Sickles, MD           12/13/2016

## 2016-12-14 ENCOUNTER — Other Ambulatory Visit: Payer: Self-pay | Admitting: *Deleted

## 2016-12-14 ENCOUNTER — Encounter: Payer: Self-pay | Admitting: *Deleted

## 2016-12-14 MED ORDER — ROSUVASTATIN CALCIUM 40 MG PO TABS: 40.0000 mg | ORAL_TABLET | Freq: Every day | ORAL | 3 refills | Status: DC

## 2017-02-20 ENCOUNTER — Other Ambulatory Visit: Payer: Self-pay | Admitting: Family Medicine

## 2017-02-20 NOTE — Telephone Encounter (Signed)
[place].  RF request for lorazepam LOV: 12/13/16 Next ov: 03/14/17 Last written: 08/13/16 #60 w/ 5Rf  Please advise. Thanks.

## 2017-02-20 NOTE — Telephone Encounter (Signed)
Rx faxed

## 2017-03-06 ENCOUNTER — Other Ambulatory Visit: Payer: Self-pay | Admitting: Family Medicine

## 2017-03-06 NOTE — Telephone Encounter (Signed)
[place].  RF request for cyclobenzaprine LOV: 12/13/16 Next ov: 03/14/17 Last written: 12/07/16 #30 w/ 6RF  Please advise. Thanks.

## 2017-03-14 ENCOUNTER — Ambulatory Visit (INDEPENDENT_AMBULATORY_CARE_PROVIDER_SITE_OTHER): Payer: Medicare Other | Admitting: Family Medicine

## 2017-03-14 ENCOUNTER — Encounter: Payer: Self-pay | Admitting: Family Medicine

## 2017-03-14 ENCOUNTER — Encounter: Payer: Self-pay | Admitting: *Deleted

## 2017-03-14 VITALS — BP 140/90 | HR 88 | Temp 98.8°F | Resp 16 | Ht 64.5 in | Wt 198.2 lb

## 2017-03-14 DIAGNOSIS — K760 Fatty (change of) liver, not elsewhere classified: Secondary | ICD-10-CM

## 2017-03-14 DIAGNOSIS — J441 Chronic obstructive pulmonary disease with (acute) exacerbation: Secondary | ICD-10-CM | POA: Diagnosis not present

## 2017-03-14 DIAGNOSIS — R7301 Impaired fasting glucose: Secondary | ICD-10-CM | POA: Diagnosis not present

## 2017-03-14 DIAGNOSIS — E782 Mixed hyperlipidemia: Secondary | ICD-10-CM | POA: Diagnosis not present

## 2017-03-14 DIAGNOSIS — G8929 Other chronic pain: Secondary | ICD-10-CM

## 2017-03-14 DIAGNOSIS — F1190 Opioid use, unspecified, uncomplicated: Secondary | ICD-10-CM

## 2017-03-14 DIAGNOSIS — M5442 Lumbago with sciatica, left side: Secondary | ICD-10-CM | POA: Diagnosis not present

## 2017-03-14 MED ORDER — ALBUTEROL SULFATE (2.5 MG/3ML) 0.083% IN NEBU: 2.5000 mg | INHALATION_SOLUTION | RESPIRATORY_TRACT | 1 refills | Status: DC | PRN

## 2017-03-14 MED ORDER — PREDNISONE 20 MG PO TABS: ORAL_TABLET | ORAL | 0 refills | Status: DC

## 2017-03-14 MED ORDER — HYDROCODONE-ACETAMINOPHEN 5-325 MG PO TABS: ORAL_TABLET | ORAL | 0 refills | Status: DC

## 2017-03-14 MED ORDER — ALBUTEROL SULFATE HFA 108 (90 BASE) MCG/ACT IN AERS: 2.0000 | INHALATION_SPRAY | RESPIRATORY_TRACT | 2 refills | Status: DC | PRN

## 2017-03-14 MED ORDER — AZITHROMYCIN 250 MG PO TABS: ORAL_TABLET | ORAL | 0 refills | Status: DC

## 2017-03-14 MED ORDER — HYDROCODONE-HOMATROPINE 5-1.5 MG/5ML PO SYRP: ORAL_SOLUTION | ORAL | 0 refills | Status: DC

## 2017-03-14 NOTE — Progress Notes (Signed)
OFFICE VISIT  03/14/2017   CC:  Chief Complaint  Patient presents with  . Follow-up    RCI,   . URI    x 2 days   HPI:    Patient is a 61 y.o. Caucasian female who presents for 3 mo f/u chronic pain syndrome (chronic lumbar DDD with spinal stenosis, hx of 5 L spine surgeries, most recent 2017). Also f/u COPD, hx of insulin resistance/IFG, and  hyperlipidemia (mixed).  Indication for chronic opioid: see above. Medication and dose: vicodin 5/325 # pills per month: 90 Last UDS date: 01/21/17 Pain contract signed (Y/N): Y (08/13/13)--updated today. Date narcotic database last reviewed (include red flags): today, no red flags. Her pain is basically the same, has been needing to help her mother who uses a walker.  One time recently her mom fell backwards and she caught her--says her back hasn't been the same since. The pain goes down L leg >>R leg. Last lipid check 3 mo ago her LDL was up so we increased her crestor to 40mg  qd.  Initially had diffuse joint pain but she stayed on the med and it resolved.  COPD: has been fine, with minimal coughing/SOB/wheeze until getting a URI recently.  Two days ago she started getting lots of cough, productive.  Wheezing is increased, SOB is increased.  Nebulized albuterol is helping.  All symptoms are much worse at night.  Fever 100-101 since onset.  Minimal ST.  HA and ribs sore from coughing so much. Past Medical History:  Diagnosis Date  . Atypical chest pain 2007; 2015   cardiolyte neg, echo nl, cath showed mild/nonobstructive LAD disease  . Chronic LBP    Multiple surgeries  . COPD (chronic obstructive pulmonary disease) ([place])   . DDD (degenerative disc disease)    spinal stenosis  . Fatty liver   . History of GI bleed    NSAIDS  . Hyperlipidemia    mixed  . Microscopic hematuria    full w/ u unrevealing X 2  . Normal nuclear stress test 11/11 and 06/2014   negative, EF normal  . Osteoarthritis of knee 06/30/2012   Right   .  Palpitations 2006   48H holter neg  . Recurrent UTI   . Tobacco dependence in remission    Quit fall 2015.  Only occasional bronchitis illness (CT shest 2009 nl- for f/u of ? nodule on CXR)    Past Surgical History:  Procedure Laterality Date  . ABDOMINAL HYSTERECTOMY  1997   DUB  . APPENDECTOMY  1984  . CARDIAC CATHETERIZATION  10/09/2005   no CAD, mildly elevated LVEDP, normal LV function (Dr. Gerrie Nordmann)  . CARDIAC CATHETERIZATION N/A 05/16/2015   Mild non-obstructive CAD--med mgmt.  Procedure: Left Heart Cath and Coronary Angiography;  Surgeon: Pixie Casino, MD;  Location: [facility];  Service: Cardiovascular;  Laterality: N/A;  . CARDIOVASCULAR STRESS TEST  06/2014   normal lexiscan NST  . CARDIOVASCULAR STRESS TEST  2006   persantine - no ischemia, low risk   . KNEE ARTHROSCOPY Bilateral   . left wrist ganglion cyst excision  2010  . [place]   right iliac crest bone graft+metal instrumentation; 2005 metal removal and decompression, 2011 lumbar decompression 4-11, then stabilization/ instrumentation done 09-19-10: L2,L3, L5 left and L2 , L3, L4 right pedical remnant L4 left embedded. Left iliac crest bone graft-- Dr Velna Ochs  . LUMBAR SPINE SURGERY  02/10/2016   Dr. Velna Ochs: lumbar decompression, instrumentation  removal, and fusion exploration--HELPED her a lot, esp her radicular leg pains.  . OOPHORECTOMY Right    cyst  . TONSILLECTOMY  61 yrs old  . TONSILLECTOMY    . TRANSTHORACIC ECHOCARDIOGRAM  2006   EF=>55%, trace MR, mild TR, trace AV regurg, trace pulm valve regurg     Outpatient Medications Prior to Visit  Medication Sig Dispense Refill  . acetaminophen (TYLENOL) 500 MG tablet Take 500 mg by mouth every 6 (six) hours as needed (pain).    Marland Kitchen atenolol (TENORMIN) 25 MG tablet Take 1 tablet (25 mg total) by mouth 2 (two) times daily. 60 tablet 6  . clopidogrel (PLAVIX) 75 MG tablet Take 1 tablet (75 mg total) by mouth daily. 90 tablet 3  .  cyclobenzaprine (FLEXERIL) 10 MG tablet take 1 tablet by mouth every 8 hours if needed 30 tablet 6  . fenofibrate 160 MG tablet take 1 tablet by mouth once daily with food 90 tablet 3  . isosorbide mononitrate (IMDUR) 30 MG 24 hr tablet Take 1 tablet (30 mg total) by mouth daily. 90 tablet 3  . LORazepam (ATIVAN) 1 MG tablet take 1 tablet by mouth twice a day 60 tablet 5  . nitroGLYCERIN (NITROSTAT) 0.4 MG SL tablet Place 1 tablet (0.4 mg total) under the tongue every 5 (five) minutes as needed for chest pain (MAX 3 doses.). 25 tablet 3  . pantoprazole (PROTONIX) 40 MG tablet Take 1 tablet (40 mg total) by mouth daily. 90 tablet 3  . rosuvastatin (CRESTOR) 40 MG tablet Take 1 tablet (40 mg total) by mouth daily. 30 tablet 3  . albuterol (PROVENTIL HFA;VENTOLIN HFA) 108 (90 BASE) MCG/ACT inhaler Inhale into the lungs every 4 (four) hours as needed for wheezing or shortness of breath (bronchitis). Reported on 03/12/2016    . albuterol (PROVENTIL) (2.5 MG/3ML) 0.083% nebulizer solution Take 3 mLs (2.5 mg total) by nebulization every 4 (four) hours as needed for wheezing or shortness of breath. 75 mL 1  . HYDROcodone-acetaminophen (NORCO/VICODIN) 5-325 MG tablet take 1 tablet by mouth every 8 hours if needed for pain 90 tablet 0   No facility-administered medications prior to visit.     Allergies  Allergen Reactions  . Aspirin Other (See Comments)     GI Bleed  . Beta Adrenergic Blockers Other (See Comments)    REACTION: decreased libido  . Nsaids Other (See Comments)     GI Upset  . Penicillins Swelling  . Prochlorperazine Edisylate Other (See Comments)     Stroke like symptoms  . Sulfonamide Derivatives Other (See Comments)    Unknown allergic reaction  . Amitriptyline Hcl Palpitations     increased heart rate    ROS As per HPI  PE: Blood pressure 140/90, pulse 88, temperature 98.8 F (37.1 C), temperature source Oral, resp. rate 16, height 5' 4.5" (1.638 m), weight 198 lb 4 oz (89.9  kg), SpO2 93 %. VS: noted--normal. Gen: alert, NAD, NONTOXIC APPEARING. HEENT: eyes without injection, drainage, or swelling.  Ears: EACs clear, TMs with normal light reflex and landmarks.  Nose: Clear rhinorrhea, with some dried, crusty exudate adherent to mildly injected mucosa.  No purulent d/c.  No paranasal sinus TTP.  No facial swelling.  Throat and mouth without focal lesion.  No pharyngial swelling, erythema, or exudate.   Neck: supple, no LAD.   LUNGS: CTA bilat, nonlabored resps.  Some mild diminished aeration on exhalation + excessive coughing after forced exp maneuver. CV: RRR, no m/r/g. EXT:  no c/c/e SKIN: no rash   LABS:  Lab Results  Component Value Date   TSH 0.807 05/10/2015   Lab Results  Component Value Date   WBC 8.3 05/10/2015   HGB 12.8 05/10/2015   HCT 38.1 05/10/2015   MCV 92.0 05/10/2015   PLT 203 05/10/2015   Lab Results  Component Value Date   CREATININE 0.57 12/13/2016   BUN 12 12/13/2016   NA 141 12/13/2016   K 4.6 12/13/2016   CL 104 12/13/2016   CO2 31 12/13/2016   Lab Results  Component Value Date   ALT 19 12/13/2016   AST 18 12/13/2016   ALKPHOS 55 12/13/2016   BILITOT 0.5 12/13/2016   Lab Results  Component Value Date   CHOL 178 12/13/2016   Lab Results  Component Value Date   HDL 39.90 12/13/2016   Lab Results  Component Value Date   LDLCALC 112 (H) 12/13/2016   Lab Results  Component Value Date   TRIG 129.0 12/13/2016   Lab Results  Component Value Date   CHOLHDL 4 12/13/2016   Lab Results  Component Value Date   HGBA1C 6.0 03/12/2016   IMPRESSION AND PLAN:  1) COPD exacerbation, triggered by viral URI. Get otc generic robitussin DM OR Mucinex DM and use as directed on the packaging for cough and congestion. Use otc generic saline nasal spray 2-3 times per day to irrigate/moisturize your nasal passages. Prednisone 40mg  qd x 5d, then 20mg  qd x 5d. Z-pack. Hycodan syrup, 1-2 tsp qhs prn severe cough, #161ml, no  RF.  2) Chronic LBP/chronic pain syndrome: stable other than mild acute flare of back pain after catching her mom from falling. She is "dealing with it" and won't escalate use of her pain med.   Updated pain contract reviewed with pt, signed, and will be scanned into chart. UDS 12/2016 with appropriate results. I printed rx's for vicodin 5/325, 1 tid prn pain, #90 today for this month, June 2018, and July 2018.  Appropriate fill on/after date was noted on each rx.  3) Hyperlipidemia: tolerating increased dose of crestor (40mg  qd).  Not fasting today, so we'll plan on recheck FLP at next f/u in 3 mo.  4) IFG: HbA1c 6.0% 1 yr ago.  Recheck HbA1c today.  An After Visit Summary was printed and given to the patient.  FOLLOW UP: Return in about 3 months (around 06/14/2017) for routine chronic illness f/u--FASTING.  Signed:  Crissie Sickles, MD           03/14/2017

## 2017-03-15 ENCOUNTER — Encounter: Payer: Self-pay | Admitting: Family Medicine

## 2017-03-15 LAB — HEPATIC FUNCTION PANEL
ALBUMIN: 4.5 g/dL (ref 3.5–5.2)
ALT: 20 U/L (ref 0–35)
AST: 19 U/L (ref 0–37)
Alkaline Phosphatase: 64 U/L (ref 39–117)
BILIRUBIN TOTAL: 0.5 mg/dL (ref 0.2–1.2)
Bilirubin, Direct: 0.1 mg/dL (ref 0.0–0.3)
Total Protein: 7.2 g/dL (ref 6.0–8.3)

## 2017-03-15 LAB — HEMOGLOBIN A1C: HEMOGLOBIN A1C: 5.9 % (ref 4.6–6.5)

## 2017-03-18 ENCOUNTER — Encounter: Payer: Self-pay | Admitting: *Deleted

## 2017-04-06 ENCOUNTER — Other Ambulatory Visit: Payer: Self-pay | Admitting: Family Medicine

## 2017-05-29 ENCOUNTER — Other Ambulatory Visit: Payer: Self-pay | Admitting: Family Medicine

## 2017-06-06 ENCOUNTER — Ambulatory Visit: Payer: Medicare Other | Admitting: Family Medicine

## 2017-06-06 ENCOUNTER — Ambulatory Visit (INDEPENDENT_AMBULATORY_CARE_PROVIDER_SITE_OTHER): Payer: Medicare Other | Admitting: Family Medicine

## 2017-06-06 ENCOUNTER — Encounter: Payer: Self-pay | Admitting: Family Medicine

## 2017-06-06 ENCOUNTER — Ambulatory Visit: Payer: Medicare Other

## 2017-06-06 VITALS — BP 144/86 | HR 81 | Temp 98.7°F | Resp 16 | Ht 64.5 in | Wt 199.5 lb

## 2017-06-06 DIAGNOSIS — M5442 Lumbago with sciatica, left side: Secondary | ICD-10-CM | POA: Diagnosis not present

## 2017-06-06 DIAGNOSIS — M130 Polyarthritis, unspecified: Secondary | ICD-10-CM

## 2017-06-06 DIAGNOSIS — F1190 Opioid use, unspecified, uncomplicated: Secondary | ICD-10-CM

## 2017-06-06 DIAGNOSIS — G8929 Other chronic pain: Secondary | ICD-10-CM

## 2017-06-06 DIAGNOSIS — M791 Myalgia, unspecified site: Secondary | ICD-10-CM

## 2017-06-06 DIAGNOSIS — E7800 Pure hypercholesterolemia, unspecified: Secondary | ICD-10-CM

## 2017-06-06 DIAGNOSIS — Z23 Encounter for immunization: Secondary | ICD-10-CM | POA: Diagnosis not present

## 2017-06-06 DIAGNOSIS — Z1211 Encounter for screening for malignant neoplasm of colon: Secondary | ICD-10-CM | POA: Diagnosis not present

## 2017-06-06 DIAGNOSIS — Z0000 Encounter for general adult medical examination without abnormal findings: Secondary | ICD-10-CM

## 2017-06-06 LAB — LIPID PANEL
CHOL/HDL RATIO: 3
Cholesterol: 131 mg/dL (ref 0–200)
HDL: 41.9 mg/dL (ref >39.00)
LDL CALC: 65 mg/dL (ref 0–99)
NONHDL: 88.7
Triglycerides: 118 mg/dL (ref 0.0–149.0)
VLDL: 23.6 mg/dL (ref 0.0–40.0)

## 2017-06-06 LAB — C-REACTIVE PROTEIN: CRP: 0.3 mg/dL — AB (ref 0.5–20.0)

## 2017-06-06 LAB — CK: CK TOTAL: 84 U/L (ref 7–177)

## 2017-06-06 LAB — SEDIMENTATION RATE: Sed Rate: 2 mm/hr (ref 0–30)

## 2017-06-06 MED ORDER — HYDROCODONE-ACETAMINOPHEN 5-325 MG PO TABS: ORAL_TABLET | ORAL | 0 refills | Status: DC

## 2017-06-06 NOTE — Progress Notes (Signed)
Subjective:   Brenda [name] is a 61 y.o. female who presents for an Initial Medicare Annual Wellness Visit.  Review of Systems    No ROS.  Medicare Wellness Visit. Additional risk factors are reflected in the social history.   Cardiac Risk Factors include: family history of premature cardiovascular disease;dyslipidemia;obesity (BMI >30kg/m2);sedentary lifestyle   Sleep patterns: Sleeps 6-7 hours, does not feel rested. Up to void x 1.  Home Safety/Smoke Alarms: Feels safe in home. Smoke alarms in place.  Living environment; residence and Firearm Safety: Lives with husband in 1 story home.  Seat Belt Safety/Bike Helmet: Wears seat belt.   Counseling:   Eye Exam-Last exam 05/2016, yearly. Will make appointment.  Dental-Last exam < 6 months. Yearly Dr. Maurie Boettcher  Female:   Pap-N/A      Mammo-Last > 5 yrs, [facility]. Declines mammo.  States she performs self breast exams.      Dexa scan-N/A       CCS-Declines. Fecal occult blood cards given to pt today.      Objective:    Today's Vitals   06/06/17 1253  BP: (!) 144/86  Pulse: 81  Resp: 16  Temp: 98.7 F (37.1 C)  TempSrc: Oral  SpO2: 93%  Weight: 199 lb 8 oz (90.5 kg)  Height: 5' 4.5" (1.638 m)   Body mass index is 33.72 kg/m.   Current Medications (verified) Outpatient Encounter Prescriptions as of 06/06/2017  Medication Sig  . acetaminophen (TYLENOL) 500 MG tablet Take 500 mg by mouth every 6 (six) hours as needed (pain).  Marland Kitchen albuterol (PROVENTIL HFA;VENTOLIN HFA) 108 (90 Base) MCG/ACT inhaler Inhale 2 puffs into the lungs every 4 (four) hours as needed for wheezing or shortness of breath (bronchitis). Reported on 03/12/2016  . albuterol (PROVENTIL) (2.5 MG/3ML) 0.083% nebulizer solution Take 3 mLs (2.5 mg total) by nebulization every 4 (four) hours as needed for wheezing or shortness of breath.  Marland Kitchen atenolol (TENORMIN) 25 MG tablet Take 1 tablet (25 mg total) by mouth 2 (two) times daily.  . clopidogrel (PLAVIX) 75 MG  tablet Take 1 tablet (75 mg total) by mouth daily.  . cyclobenzaprine (FLEXERIL) 10 MG tablet take 1 tablet by mouth every 8 hours if needed  . fenofibrate 160 MG tablet take 1 tablet by mouth once daily with food  . HYDROcodone-acetaminophen (NORCO/VICODIN) 5-325 MG tablet take 1 tablet by mouth every 8 hours if needed for pain  . isosorbide mononitrate (IMDUR) 30 MG 24 hr tablet Take 1 tablet (30 mg total) by mouth daily.  Marland Kitchen LORazepam (ATIVAN) 1 MG tablet take 1 tablet by mouth twice a day  . nitroGLYCERIN (NITROSTAT) 0.4 MG SL tablet Place 1 tablet (0.4 mg total) under the tongue every 5 (five) minutes as needed for chest pain (MAX 3 doses.).  Marland Kitchen pantoprazole (PROTONIX) 40 MG tablet Take 1 tablet (40 mg total) by mouth daily.  . rosuvastatin (CRESTOR) 40 MG tablet take 1 tablet by mouth once daily  . [DISCONTINUED] HYDROcodone-acetaminophen (NORCO/VICODIN) 5-325 MG tablet take 1 tablet by mouth every 8 hours if needed for pain  . [DISCONTINUED] HYDROcodone-acetaminophen (NORCO/VICODIN) 5-325 MG tablet take 1 tablet by mouth every 8 hours if needed for pain  . [DISCONTINUED] HYDROcodone-acetaminophen (NORCO/VICODIN) 5-325 MG tablet take 1 tablet by mouth every 8 hours if needed for pain  . [DISCONTINUED] azithromycin (ZITHROMAX) 250 MG tablet 2 tabs po qd x 1d, then 1 tab po qd x 4d (Patient not taking: Reported on 06/06/2017)  . [  DISCONTINUED] HYDROcodone-homatropine (HYCODAN) 5-1.5 MG/5ML syrup 1-2 tsp po qhs prn severe cough (Patient not taking: Reported on 06/06/2017)  . [DISCONTINUED] predniSONE (DELTASONE) 20 MG tablet 2 tabs po qd x 5d, then 1 tab po qd x 5d (Patient not taking: Reported on 06/06/2017)   No facility-administered encounter medications on file as of 06/06/2017.     Allergies (verified) Aspirin; Beta adrenergic blockers; Nsaids; Penicillins; Prochlorperazine edisylate; Sulfonamide derivatives; and Amitriptyline hcl   History: Past Medical History:  Diagnosis Date  . Atypical  chest pain 2007; 2015   cardiolyte neg, echo nl, cath showed mild/nonobstructive LAD disease  . Chronic LBP    Multiple surgeries  . COPD (chronic obstructive pulmonary disease) ([place])   . DDD (degenerative disc disease)    spinal stenosis  . Fatty liver   . History of GI bleed    NSAIDS  . Hyperlipidemia    mixed  . Microscopic hematuria    full w/ u unrevealing X 2  . Normal nuclear stress test 11/11 and 06/2014   negative, EF normal  . Osteoarthritis of knee 06/30/2012   Right   . Palpitations 2006   48H holter neg  . Prediabetes    Highest A1c 6.1% as of 03/2017  . Recurrent UTI   . Tobacco dependence in remission    Quit fall 2015.  Only occasional bronchitis illness (CT shest 2009 nl- for f/u of ? nodule on CXR)   Past Surgical History:  Procedure Laterality Date  . ABDOMINAL HYSTERECTOMY  1997   DUB  . APPENDECTOMY  1984  . CARDIAC CATHETERIZATION  10/09/2005   no CAD, mildly elevated LVEDP, normal LV function (Dr. Gerrie Nordmann)  . CARDIAC CATHETERIZATION N/A 05/16/2015   Mild non-obstructive CAD--med mgmt.  Procedure: Left Heart Cath and Coronary Angiography;  Surgeon: Pixie Casino, MD;  Location: [facility];  Service: Cardiovascular;  Laterality: N/A;  . CARDIOVASCULAR STRESS TEST  06/2014   normal lexiscan NST  . CARDIOVASCULAR STRESS TEST  2006   persantine - no ischemia, low risk   . KNEE ARTHROSCOPY Bilateral   . left wrist ganglion cyst excision  2010  . [place]   right iliac crest bone graft+metal instrumentation; 2005 metal removal and decompression, 2011 lumbar decompression 4-11, then stabilization/ instrumentation done 09-19-10: L2,L3, L5 left and L2 , L3, L4 right pedical remnant L4 left embedded. Left iliac crest bone graft-- Dr Velna Ochs  . LUMBAR SPINE SURGERY  02/10/2016   Dr. Velna Ochs: lumbar decompression, instrumentation removal, and fusion exploration--HELPED her a lot, esp her radicular leg pains.  . OOPHORECTOMY Right    cyst    . TONSILLECTOMY  61 yrs old  . TONSILLECTOMY    . TRANSTHORACIC ECHOCARDIOGRAM  2006   EF=>55%, trace MR, mild TR, trace AV regurg, trace pulm valve regurg    Family History  Problem Relation Age of Onset  . Heart Problems Mother        and thyroid problems  . Hyperlipidemia Mother   . Diabetes Mother   . Breast cancer Mother   . Heart failure Father        CHF, heart attack, diabetes, hyperlipidemia  . Heart disease Brother        back problems  . Hypertension Brother   . Kidney failure Maternal Grandmother   . Stroke Maternal Grandfather   . Cancer Paternal Grandmother        mouth - snuff  . Heart attack Paternal Grandfather  stroke, HTN  . Hyperlipidemia Brother   . Heart attack Brother   . Cancer Brother   . Coronary artery disease Neg Hx    Social History   Occupational History  . Not on file.   Social History Main Topics  . Smoking status: Former Smoker    Packs/day: 1.00    Years: 35.00    Types: Cigarettes    Quit date: 07/22/2014  . Smokeless tobacco: Never Used     Comment: down to ~2 cigarettes/daily (06/23/14) - Quit Smoking around 07/20/14!  Marland Kitchen Alcohol use No  . Drug use: No  . Sexual activity: Yes    Partners: Male    Birth control/ protection: Surgical    Tobacco Counseling Counseling given: Not Answered   Activities of Daily Living In your present state of health, do you have any difficulty performing the following activities: 06/06/2017  Hearing? N  Vision? N  Difficulty concentrating or making decisions? N  Walking or climbing stairs? Y  Comment knees problem with steps  Dressing or bathing? N  Doing errands, shopping? N  Preparing Food and eating ? N  Using the Toilet? N  In the past six months, have you accidently leaked urine? N  Do you have problems with loss of bowel control? N  Managing your Medications? N  Managing your Finances? N  Housekeeping or managing your Housekeeping? N  Some recent data might be hidden     Immunizations and Health Maintenance Immunization History  Administered Date(s) Administered  . Influenza Split 10/25/2011  . Influenza, Seasonal, Injecte, Preservative Fre 10/30/2012  . Influenza,inj,Quad PF,36+ Mos 11/12/2013, 07/14/2014, 07/14/2015, 09/13/2016  . Pneumococcal Polysaccharide-23 02/21/2012, 06/06/2017  . Tdap 10/25/2011   Health Maintenance Due  Topic Date Due  . COLONOSCOPY  06/08/2006  . INFLUENZA VACCINE  06/05/2017   Declines colonoscopy and mammogram.   Patient Care Team: Tammi Sou, MD as PCP - General Hilty, Nadean Corwin, MD as Consulting Physician (Cardiology) Derian, Nicholaus Corolla, MD (Orthopedic Surgery)  Indicate any recent Medical Services you may have received from other than [facility] providers in the past year (date may be approximate).     Assessment:   This is a routine wellness examination for Brenda [name]. Physical assessment deferred to PCP.   Hearing/Vision screen Hearing Screening Comments: Able to hear conversational tones w/o difficulty. No issues reported.   Vision Screening Comments: Wears glasses.   Dietary issues and exercise activities discussed: Current Exercise Habits: The patient does not participate in regular exercise at present [facility]), Exercise limited by: orthopedic condition(s) (back and knees)   Diet (meal preparation, eat out, water intake, caffeinated beverages, dairy products, fruits and vegetables): Drinks water and Coke Zero.   Breakfast: eggs, toast, coffee (decaf) Lunch: grilled chicken, salad, soup Dinner:  Protein, vegetables.     Discussed heart healthy diet and increasing activity. Exercises emailed to pt via EMMI portal at [email or web address]  Goals    . Weight (lb) < 180 lb (81.6 kg)          Lose weight by increase walking.       Depression Screen PHQ 2/9 Scores 06/06/2017 06/06/2017  PHQ - 2 Score 0 0    Fall Risk Fall Risk  06/06/2017  Falls in the past year? No    Cognitive Function:        Ad8 score reviewed for issues:  Issues making decisions: no  Less interest in hobbies / activities: no  Repeats questions, stories (family complaining): no  Trouble  using ordinary gadgets (microwave, computer, phone): no  Forgets the month or year: no  Mismanaging finances: no  Remembering appts: no  Daily problems with thinking and/or memory: no Ad8 score is=0     Screening Tests Health Maintenance  Topic Date Due  . COLONOSCOPY  06/08/2006  . INFLUENZA VACCINE  06/05/2017  . MAMMOGRAM  06/06/2018 (Originally 06/08/2006)  . TETANUS/TDAP  10/24/2021  . PNEUMOCOCCAL POLYSACCHARIDE VACCINE  Completed  . Hepatitis C Screening  Completed  . HIV Screening  Completed      Plan:    Continue doing brain stimulating activities (puzzles, reading, adult coloring books, staying active) to keep memory sharp.  Schedule Eye appointment.     I have personally reviewed and noted the following in the patient's chart:   . Medical and social history . Use of alcohol, tobacco or illicit drugs  . Current medications and supplements . Functional ability and status . Nutritional status . Physical activity . Advanced directives . List of other physicians . Hospitalizations, surgeries, and ER visits in previous 12 months . Vitals . Screenings to include cognitive, depression, and falls . Referrals and appointments  In addition, I have reviewed and discussed with patient certain preventive protocols, quality metrics, and best practice recommendations. A written personalized care plan for preventive services as well as general preventive health recommendations were provided to patient.     Gerilyn Nestle, RN   06/06/2017

## 2017-06-06 NOTE — Progress Notes (Signed)
OFFICE VISIT  06/06/2017   CC:  Chief Complaint  Patient presents with  . Follow-up    [facility]  . Medicare Wellness    HPI:    Patient is a 61 y.o. Caucasian female who presents for 3 mo f/u chronic low back pain (chronic pain syndrome) requiring narcotics for optimal functioning and quality of life. Also for HTN and HLD.  Acute complaint: gradual worsening chronic pain in joints of hands, fingers, ankles.   Actually describes all bones/joints/soft tissues hurting.  No joint swelling or redness or warmth.  Points to PIPs and DIPs of hands, not MTP joints.  No rash.  Joint stiffness in hands and ankles in morning that takes "hours" to go away".  Joint pain not any different wt bearing vs resting. No fevers, wt loss.  Appetite good.  No increase in activity prior to onset of the pain worsening.    Breathing feeling good.    BP: no home bp monitoring with regularity---always 120s/80s when she does check it.    HLD: taking rosuvastatin 40 mg qd and this was an increase in dosage done around 09/2017.  Indication for chronic opioid: see above. Medication and dose: vicodin 5/325 # pills per month: 90 Last UDS date: 01/21/17 Pain contract signed (Y/N): yes, 03/14/17 Date narcotic database last reviewed (include red flags): today, no red flags.   Past Medical History:  Diagnosis Date  . Atypical chest pain 2007; 2015   cardiolyte neg, echo nl, cath showed mild/nonobstructive LAD disease  . Chronic LBP    Multiple surgeries  . COPD (chronic obstructive pulmonary disease) ([place])   . DDD (degenerative disc disease)    spinal stenosis  . Fatty liver   . History of GI bleed    NSAIDS  . Hyperlipidemia    mixed  . Microscopic hematuria    full w/ u unrevealing X 2  . Normal nuclear stress test 11/11 and 06/2014   negative, EF normal  . Osteoarthritis of knee 06/30/2012   Right   . Palpitations 2006   48H holter neg  . Prediabetes    Highest A1c 6.1% as of 03/2017  . Recurrent UTI   .  Tobacco dependence in remission    Quit fall 2015.  Only occasional bronchitis illness (CT shest 2009 nl- for f/u of ? nodule on CXR)    Past Surgical History:  Procedure Laterality Date  . ABDOMINAL HYSTERECTOMY  1997   DUB  . APPENDECTOMY  1984  . CARDIAC CATHETERIZATION  10/09/2005   no CAD, mildly elevated LVEDP, normal LV function (Dr. Gerrie Nordmann)  . CARDIAC CATHETERIZATION N/A 05/16/2015   Mild non-obstructive CAD--med mgmt.  Procedure: Left Heart Cath and Coronary Angiography;  Surgeon: Pixie Casino, MD;  Location: [place] [facility];  Service: Cardiovascular;  Laterality: N/A;  . CARDIOVASCULAR STRESS TEST  06/2014   normal lexiscan NST  . CARDIOVASCULAR STRESS TEST  2006   persantine - no ischemia, low risk   . KNEE ARTHROSCOPY Bilateral   . left wrist ganglion cyst excision  2010  . [place]   right iliac crest bone graft+metal instrumentation; 2005 metal removal and decompression, 2011 lumbar decompression 4-11, then stabilization/ instrumentation done 09-19-10: L2,L3, L5 left and L2 , L3, L4 right pedical remnant L4 left embedded. Left iliac crest bone graft-- Dr Velna Ochs  . LUMBAR SPINE SURGERY  02/10/2016   Dr. Velna Ochs: lumbar decompression, instrumentation removal, and fusion exploration--HELPED her a lot, esp her radicular  leg pains.  . OOPHORECTOMY Right    cyst  . TONSILLECTOMY  61 yrs old  . TONSILLECTOMY    . TRANSTHORACIC ECHOCARDIOGRAM  2006   EF=>55%, trace MR, mild TR, trace AV regurg, trace pulm valve regurg     Outpatient Medications Prior to Visit  Medication Sig Dispense Refill  . acetaminophen (TYLENOL) 500 MG tablet Take 500 mg by mouth every 6 (six) hours as needed (pain).    Marland Kitchen albuterol (PROVENTIL HFA;VENTOLIN HFA) 108 (90 Base) MCG/ACT inhaler Inhale 2 puffs into the lungs every 4 (four) hours as needed for wheezing or shortness of breath (bronchitis). Reported on 03/12/2016 1 Inhaler 2  . albuterol (PROVENTIL) (2.5 MG/3ML) 0.083%  nebulizer solution Take 3 mLs (2.5 mg total) by nebulization every 4 (four) hours as needed for wheezing or shortness of breath. 75 mL 1  . atenolol (TENORMIN) 25 MG tablet Take 1 tablet (25 mg total) by mouth 2 (two) times daily. 60 tablet 6  . clopidogrel (PLAVIX) 75 MG tablet Take 1 tablet (75 mg total) by mouth daily. 90 tablet 3  . cyclobenzaprine (FLEXERIL) 10 MG tablet take 1 tablet by mouth every 8 hours if needed 30 tablet 6  . fenofibrate 160 MG tablet take 1 tablet by mouth once daily with food 90 tablet 3  . isosorbide mononitrate (IMDUR) 30 MG 24 hr tablet Take 1 tablet (30 mg total) by mouth daily. 90 tablet 3  . LORazepam (ATIVAN) 1 MG tablet take 1 tablet by mouth twice a day 60 tablet 5  . nitroGLYCERIN (NITROSTAT) 0.4 MG SL tablet Place 1 tablet (0.4 mg total) under the tongue every 5 (five) minutes as needed for chest pain (MAX 3 doses.). 25 tablet 3  . pantoprazole (PROTONIX) 40 MG tablet Take 1 tablet (40 mg total) by mouth daily. 90 tablet 3  . rosuvastatin (CRESTOR) 40 MG tablet take 1 tablet by mouth once daily 120 tablet 0  . HYDROcodone-acetaminophen (NORCO/VICODIN) 5-325 MG tablet take 1 tablet by mouth every 8 hours if needed for pain 90 tablet 0  . azithromycin (ZITHROMAX) 250 MG tablet 2 tabs po qd x 1d, then 1 tab po qd x 4d (Patient not taking: Reported on 06/06/2017) 6 tablet 0  . HYDROcodone-homatropine (HYCODAN) 5-1.5 MG/5ML syrup 1-2 tsp po qhs prn severe cough (Patient not taking: Reported on 06/06/2017) 120 mL 0  . predniSONE (DELTASONE) 20 MG tablet 2 tabs po qd x 5d, then 1 tab po qd x 5d (Patient not taking: Reported on 06/06/2017) 15 tablet 0   No facility-administered medications prior to visit.     Allergies  Allergen Reactions  . Aspirin Other (See Comments)     GI Bleed  . Beta Adrenergic Blockers Other (See Comments)    REACTION: decreased libido  . Nsaids Other (See Comments)     GI Upset  . Penicillins Swelling  . Prochlorperazine Edisylate Other  (See Comments)     Stroke like symptoms  . Sulfonamide Derivatives Other (See Comments)    Unknown allergic reaction  . Amitriptyline Hcl Palpitations     increased heart rate    ROS As per HPI  PE: Blood pressure (!) 144/86, pulse 81, temperature 98.7 F (37.1 C), temperature source Oral, resp. rate 16, height 5' 4.5" (1.638 m), weight 199 lb 8 oz (90.5 kg), SpO2 93 %. Gen: Alert, well appearing.  Patient is oriented to person, place, time, and situation. AFFECT: pleasant, lucid thought and speech. WLS:LHTD: no injection, icteris, swelling,  or exudate.  EOMI, PERRLA. Mouth: lips without lesion/swelling.  Oral mucosa pink and moist. Oropharynx without erythema, exudate, or swelling.  CV: RRR, no m/r/g.   LUNGS: CTA bilat, nonlabored resps, good aeration in all lung fields. EXT: no clubbing, cyanosis, or edema.  MUSC: she has no joint swelling, erythema, tenderness, or warmth.  A couple of small, nontender, hard nodules are palpable at a few PIP joints.  No physical exam evidence for synovitis or inflammatory arthritis. Joint ROM intact.  No muscle tenderness.  No rash.  LABS:    Chemistry      Component Value Date/Time   NA 141 12/13/2016 1514   K 4.6 12/13/2016 1514   CL 104 12/13/2016 1514   CO2 31 12/13/2016 1514   BUN 12 12/13/2016 1514   CREATININE 0.57 12/13/2016 1514   CREATININE 0.69 06/29/2011 1607      Component Value Date/Time   CALCIUM 9.9 12/13/2016 1514   ALKPHOS 64 03/14/2017 1607   AST 19 03/14/2017 1607   ALT 20 03/14/2017 1607   BILITOT 0.5 03/14/2017 1607     Lab Results  Component Value Date   CHOL 178 12/13/2016   HDL 39.90 12/13/2016   LDLCALC 112 (H) 12/13/2016   LDLDIRECT 172.2 07/14/2014   TRIG 129.0 12/13/2016   CHOLHDL 4 12/13/2016   Lab Results  Component Value Date   HGBA1C 5.9 03/14/2017   IMPRESSION AND PLAN:  1) Diffuse arthralgias and myalgias: no physical exam signs of inflammatory arthritis or myositis. She does have  history of documented osteoarthritis in back and knees, so I suspect that is what she has in wrists, hands, and ankles.  Her myalgias may possibly be a statin side effect: we did increase her dose back in 09/2016. Instructions: Stop rosuvastatin for 2 weeks.  If muscle/joint pain is improved, then restart this med and see if pain returns to previous level. If no change in pain after stopping it for 2 weeks, then restart this med. Will check CK total, ESR, CRP.  She cannot take NSAIDs. No new meds recommended today.  Try to continue as much activity as possible.  2) Hyperlipidemia: recheck FLP today. As noted in #1 above, she'll try a statin holiday to see if a component of her pain is statin side effect.  3) Chronic pain syndrome: stable LBP. I printed rx's for vicodin 5/325, 1 tab q8h prn, #90 today for this month, Sept 2018, and Oct 2018.  Appropriate fill on/after date was noted on each rx.  4) HTN: The current medical regimen is effective;  continue present plan and medications. Lytes/cr good 12/2016.    5) Preventative health care: pneumovax 23 booster today.  Colon ca screening: she declines colonoscopy but is agreeable to iFOB today. AWV with health coach today.  An After Visit Summary was printed and given to the patient.  FOLLOW UP: Return in about 3 months (around 09/06/2017) for routine chronic illness f/u.  Signed:  Crissie Sickles, MD           06/06/2017

## 2017-06-06 NOTE — Progress Notes (Signed)
AWV reviewed and agree.  Signed:  Crissie Sickles, MD           06/06/2017

## 2017-06-06 NOTE — Patient Instructions (Addendum)
Stop rosuvastatin for 2 weeks.  If muscle/joint pain is improved, then restart this med and see if pain returns to previous level. If no change in pain after stopping it for 2 weeks, then restart this med.  Continue doing brain stimulating activities (puzzles, reading, adult coloring books, staying active) to keep memory sharp.   Schedule eye appointment.   Health Maintenance, Female Adopting a healthy lifestyle and getting preventive care can go a long way to promote health and wellness. Talk with your health care provider about what schedule of regular examinations is right for you. This is a good chance for you to check in with your provider about disease prevention and staying healthy. In between checkups, there are plenty of things you can do on your own. Experts have done a lot of research about which lifestyle changes and preventive measures are most likely to keep you healthy. Ask your health care provider for more information. Weight and diet Eat a healthy diet  Be sure to include plenty of vegetables, fruits, low-fat dairy products, and lean protein.  Do not eat a lot of foods high in solid fats, added sugars, or salt.  Get regular exercise. This is one of the most important things you can do for your health. ? Most adults should exercise for at least 150 minutes each week. The exercise should increase your heart rate and make you sweat (moderate-intensity exercise). ? Most adults should also do strengthening exercises at least twice a week. This is in addition to the moderate-intensity exercise.  Maintain a healthy weight  Body mass index (BMI) is a measurement that can be used to identify possible weight problems. It estimates body fat based on height and weight. Your health care provider can help determine your BMI and help you achieve or maintain a healthy weight.  For females 41 years of age and older: ? A BMI below 18.5 is considered underweight. ? A BMI of 18.5 to 24.9 is  normal. ? A BMI of 25 to 29.9 is considered overweight. ? A BMI of 30 and above is considered obese.  Watch levels of cholesterol and blood lipids  You should start having your blood tested for lipids and cholesterol at 62 years of age, then have this test every 5 years.  You may need to have your cholesterol levels checked more often if: ? Your lipid or cholesterol levels are high. ? You are older than 61 years of age. ? You are at high risk for heart disease.  Cancer screening Lung Cancer  Lung cancer screening is recommended for adults 4-35 years old who are at high risk for lung cancer because of a history of smoking.  A yearly low-dose CT scan of the lungs is recommended for people who: ? Currently smoke. ? Have quit within the past 15 years. ? Have at least a 30-pack-year history of smoking. A pack year is smoking an average of one pack of cigarettes a day for 1 year.  Yearly screening should continue until it has been 15 years since you quit.  Yearly screening should stop if you develop a health problem that would prevent you from having lung cancer treatment.  Breast Cancer  Practice breast self-awareness. This means understanding how your breasts normally appear and feel.  It also means doing regular breast self-exams. Let your health care provider know about any changes, no matter how small.  If you are in your 20s or 30s, you should have a clinical breast exam (CBE)  by a health care provider every 1-3 years as part of a regular health exam.  If you are 40 or older, have a CBE every year. Also consider having a breast X-ray (mammogram) every year.  If you have a family history of breast cancer, talk to your health care provider about genetic screening.  If you are at high risk for breast cancer, talk to your health care provider about having an MRI and a mammogram every year.  Breast cancer gene (BRCA) assessment is recommended for women who have family members  with BRCA-related cancers. BRCA-related cancers include: ? Breast. ? Ovarian. ? Tubal. ? Peritoneal cancers.  Results of the assessment will determine the need for genetic counseling and BRCA1 and BRCA2 testing.  Cervical Cancer Your health care provider may recommend that you be screened regularly for cancer of the pelvic organs (ovaries, uterus, and vagina). This screening involves a pelvic examination, including checking for microscopic changes to the surface of your cervix (Pap test). You may be encouraged to have this screening done every 3 years, beginning at age 21.  For women ages 30-65, health care providers may recommend pelvic exams and Pap testing every 3 years, or they may recommend the Pap and pelvic exam, combined with testing for human papilloma virus (HPV), every 5 years. Some types of HPV increase your risk of cervical cancer. Testing for HPV may also be done on women of any age with unclear Pap test results.  Other health care providers may not recommend any screening for nonpregnant women who are considered low risk for pelvic cancer and who do not have symptoms. Ask your health care provider if a screening pelvic exam is right for you.  If you have had past treatment for cervical cancer or a condition that could lead to cancer, you need Pap tests and screening for cancer for at least 20 years after your treatment. If Pap tests have been discontinued, your risk factors (such as having a new sexual partner) need to be reassessed to determine if screening should resume. Some women have medical problems that increase the chance of getting cervical cancer. In these cases, your health care provider may recommend more frequent screening and Pap tests.  Colorectal Cancer  This type of cancer can be detected and often prevented.  Routine colorectal cancer screening usually begins at 61 years of age and continues through 61 years of age.  Your health care provider may recommend  screening at an earlier age if you have risk factors for colon cancer.  Your health care provider may also recommend using home test kits to check for hidden blood in the stool.  A small camera at the end of a tube can be used to examine your colon directly (sigmoidoscopy or colonoscopy). This is done to check for the earliest forms of colorectal cancer.  Routine screening usually begins at age 50.  Direct examination of the colon should be repeated every 5-10 years through 61 years of age. However, you may need to be screened more often if early forms of precancerous polyps or small growths are found.  Skin Cancer  Check your skin from head to toe regularly.  Tell your health care provider about any new moles or changes in moles, especially if there is a change in a mole's shape or color.  Also tell your health care provider if you have a mole that is larger than the size of a pencil eraser.  Always use sunscreen. Apply sunscreen liberally and   repeatedly throughout the day.  Protect yourself by wearing long sleeves, pants, a wide-brimmed hat, and sunglasses whenever you are outside.  Heart disease, diabetes, and high blood pressure  High blood pressure causes heart disease and increases the risk of stroke. High blood pressure is more likely to develop in: ? People who have blood pressure in the high end of the normal range (130-139/85-89 mm Hg). ? People who are overweight or obese. ? People who are African American.  If you are 18-39 years of age, have your blood pressure checked every 3-5 years. If you are 40 years of age or older, have your blood pressure checked every year. You should have your blood pressure measured twice-once when you are at a hospital or clinic, and once when you are not at a hospital or clinic. Record the average of the two measurements. To check your blood pressure when you are not at a hospital or clinic, you can use: ? An automated blood pressure machine at  a pharmacy. ? A home blood pressure monitor.  If you are between 55 years and 79 years old, ask your health care provider if you should take aspirin to prevent strokes.  Have regular diabetes screenings. This involves taking a blood sample to check your fasting blood sugar level. ? If you are at a normal weight and have a low risk for diabetes, have this test once every three years after 61 years of age. ? If you are overweight and have a high risk for diabetes, consider being tested at a younger age or more often. Preventing infection Hepatitis B  If you have a higher risk for hepatitis B, you should be screened for this virus. You are considered at high risk for hepatitis B if: ? You were born in a country where hepatitis B is common. Ask your health care provider which countries are considered high risk. ? Your parents were born in a high-risk country, and you have not been immunized against hepatitis B (hepatitis B vaccine). ? You have HIV or AIDS. ? You use needles to inject street drugs. ? You live with someone who has hepatitis B. ? You have had sex with someone who has hepatitis B. ? You get hemodialysis treatment. ? You take certain medicines for conditions, including cancer, organ transplantation, and autoimmune conditions.  Hepatitis C  Blood testing is recommended for: ? Everyone born from 1945 through 1965. ? Anyone with known risk factors for hepatitis C.  Sexually transmitted infections (STIs)  You should be screened for sexually transmitted infections (STIs) including gonorrhea and chlamydia if: ? You are sexually active and are younger than 61 years of age. ? You are older than 61 years of age and your health care provider tells you that you are at risk for this type of infection. ? Your sexual activity has changed since you were last screened and you are at an increased risk for chlamydia or gonorrhea. Ask your health care provider if you are at risk.  If you do  not have HIV, but are at risk, it may be recommended that you take a prescription medicine daily to prevent HIV infection. This is called pre-exposure prophylaxis (PrEP). You are considered at risk if: ? You are sexually active and do not regularly use condoms or know the HIV status of your partner(s). ? You take drugs by injection. ? You are sexually active with a partner who has HIV.  Talk with your health care provider about whether you   are at high risk of being infected with HIV. If you choose to begin PrEP, you should first be tested for HIV. You should then be tested every 3 months for as long as you are taking PrEP. Pregnancy  If you are premenopausal and you may become pregnant, ask your health care provider about preconception counseling.  If you may become pregnant, take 400 to 800 micrograms (mcg) of folic acid every day.  If you want to prevent pregnancy, talk to your health care provider about birth control (contraception). Osteoporosis and menopause  Osteoporosis is a disease in which the bones lose minerals and strength with aging. This can result in serious bone fractures. Your risk for osteoporosis can be identified using a bone density scan.  If you are 65 years of age or older, or if you are at risk for osteoporosis and fractures, ask your health care provider if you should be screened.  Ask your health care provider whether you should take a calcium or vitamin D supplement to lower your risk for osteoporosis.  Menopause may have certain physical symptoms and risks.  Hormone replacement therapy may reduce some of these symptoms and risks. Talk to your health care provider about whether hormone replacement therapy is right for you. Follow these instructions at home:  Schedule regular health, dental, and eye exams.  Stay current with your immunizations.  Do not use any tobacco products including cigarettes, chewing tobacco, or electronic cigarettes.  If you are  pregnant, do not drink alcohol.  If you are breastfeeding, limit how much and how often you drink alcohol.  Limit alcohol intake to no more than 1 drink per day for nonpregnant women. One drink equals 12 ounces of beer, 5 ounces of wine, or 1 ounces of hard liquor.  Do not use street drugs.  Do not share needles.  Ask your health care provider for help if you need support or information about quitting drugs.  Tell your health care provider if you often feel depressed.  Tell your health care provider if you have ever been abused or do not feel safe at home. This information is not intended to replace advice given to you by your health care provider. Make sure you discuss any questions you have with your health care provider. Document Released: 05/07/2011 Document Revised: 03/29/2016 Document Reviewed: 07/26/2015 Elsevier Interactive Patient Education  2018 [employer]vier Inc.  

## 2017-06-12 ENCOUNTER — Other Ambulatory Visit (INDEPENDENT_AMBULATORY_CARE_PROVIDER_SITE_OTHER): Payer: Medicare Other

## 2017-06-12 DIAGNOSIS — Z1211 Encounter for screening for malignant neoplasm of colon: Secondary | ICD-10-CM | POA: Diagnosis not present

## 2017-06-12 LAB — FECAL OCCULT BLOOD, IMMUNOCHEMICAL: Fecal Occult Bld: NEGATIVE

## 2017-07-23 ENCOUNTER — Other Ambulatory Visit: Payer: Self-pay | Admitting: Family Medicine

## 2017-08-17 ENCOUNTER — Other Ambulatory Visit: Payer: Self-pay | Admitting: Family Medicine

## 2017-08-19 NOTE — Telephone Encounter (Signed)
[facility] [employer]  RF request for cyclobenzaprine LOV: 06/06/17 Next ov: 09/05/17 Last written: 05/29/17 #30 w/ 6RF  Please advise. Thanks.

## 2017-08-19 NOTE — Telephone Encounter (Signed)
I don't understand how pt needs RF's. I rx'd #30 05/29/17 with 6 additional RF's, so she should have at least 3 RFs left.

## 2017-08-20 NOTE — Telephone Encounter (Signed)
SW pt to see how often she is taking this medication. She stated that she takes this medication "every 8 hours just about every day".

## 2017-09-05 ENCOUNTER — Ambulatory Visit (INDEPENDENT_AMBULATORY_CARE_PROVIDER_SITE_OTHER): Payer: Medicare Other | Admitting: Family Medicine

## 2017-09-05 ENCOUNTER — Encounter: Payer: Self-pay | Admitting: Family Medicine

## 2017-09-05 VITALS — BP 145/76 | HR 78 | Temp 98.0°F | Resp 16 | Ht 64.5 in | Wt 195.5 lb

## 2017-09-05 DIAGNOSIS — G8929 Other chronic pain: Secondary | ICD-10-CM | POA: Diagnosis not present

## 2017-09-05 DIAGNOSIS — I10 Essential (primary) hypertension: Secondary | ICD-10-CM

## 2017-09-05 DIAGNOSIS — F419 Anxiety disorder, unspecified: Secondary | ICD-10-CM

## 2017-09-05 DIAGNOSIS — F1190 Opioid use, unspecified, uncomplicated: Secondary | ICD-10-CM

## 2017-09-05 DIAGNOSIS — M159 Polyosteoarthritis, unspecified: Secondary | ICD-10-CM

## 2017-09-05 DIAGNOSIS — M150 Primary generalized (osteo)arthritis: Secondary | ICD-10-CM

## 2017-09-05 DIAGNOSIS — Z23 Encounter for immunization: Secondary | ICD-10-CM

## 2017-09-05 DIAGNOSIS — M5442 Lumbago with sciatica, left side: Secondary | ICD-10-CM | POA: Diagnosis not present

## 2017-09-05 DIAGNOSIS — E782 Mixed hyperlipidemia: Secondary | ICD-10-CM

## 2017-09-05 MED ORDER — HYDROCODONE-ACETAMINOPHEN 5-325 MG PO TABS: ORAL_TABLET | ORAL | 0 refills | Status: DC

## 2017-09-05 MED ORDER — LORAZEPAM 1 MG PO TABS: 1.0000 mg | ORAL_TABLET | Freq: Two times a day (BID) | ORAL | 5 refills | Status: DC

## 2017-09-05 NOTE — Progress Notes (Signed)
OFFICE VISIT  09/05/2017   CC:  Chief Complaint  Patient presents with  . Follow-up    [facility]   HPI:    Patient is a 61 y.o. Caucasian female who presents for f/u chronic pain syndrome, COPD, hx of HTN (not on meds now) and HLD. Last visit we discussed doing a brief trial off her statin to see if this helped alleviate her diffuse polyarthralgia complaint.   Indication for chronic opioid: chronic low back pain.  Opioids rx'd to maximize functioning and quality of life. Medication and dose: vicodin 5/325, 1 tid prn # pills per month: 90 Last UDS date: 01/21/17 Pain contract signed (Y/N): Y, 03/14/17. Date narcotic database last reviewed (include red flags): today, no suspicious activity.  Chronic LBP worse since the colder weather, still taking vicodin tid regularly.  No radicular pain. No side effects from med.  No NSAIDs. Still with signif pain/stiffness in hands, knees, and tops of feet ache as well.  Morning stiffness lasts a couple hours. Trial off rosuvastatin did NOT make any difference so she restarted this med.  COPD: breathing has been good, no wheezing and no coughing.    HLD: tolerating rosuvastatin --tolerating well.   Home bp monitoring 130s/80s.  Of note, she has not had her ativan for the last few days.  ROS: no joint swelling or redness.  No rash.  No CP, no fevers, no HAs, no melena or hematochezia.   Past Medical History:  Diagnosis Date  . Atypical chest pain 2007; 2015   cardiolyte neg, echo nl, cath showed mild/nonobstructive LAD disease  . Chronic LBP    Multiple surgeries  . COPD (chronic obstructive pulmonary disease) ([place])   . DDD (degenerative disc disease)    spinal stenosis  . Fatty liver   . History of GI bleed    NSAIDS  . Hyperlipidemia    mixed  . Microscopic hematuria    full w/ u unrevealing X 2  . Normal nuclear stress test 11/11 and 06/2014   negative, EF normal  . Osteoarthritis of knee 06/30/2012   Right   . Palpitations 2006   48H holter neg  . Prediabetes    Highest A1c 6.1% as of 03/2017  . Recurrent UTI   . Tobacco dependence in remission    Quit fall 2015.  Only occasional bronchitis illness (CT shest 2009 nl- for f/u of ? nodule on CXR)    Past Surgical History:  Procedure Laterality Date  . ABDOMINAL HYSTERECTOMY  1997   DUB  . APPENDECTOMY  1984  . CARDIAC CATHETERIZATION  10/09/2005   no CAD, mildly elevated LVEDP, normal LV function (Dr. Gerrie Nordmann)  . CARDIAC CATHETERIZATION N/A 05/16/2015   Mild non-obstructive CAD--med mgmt.  Procedure: Left Heart Cath and Coronary Angiography;  Surgeon: Pixie Casino, MD;  Location: [facility];  Service: Cardiovascular;  Laterality: N/A;  . CARDIOVASCULAR STRESS TEST  06/2014   normal lexiscan NST  . CARDIOVASCULAR STRESS TEST  2006   persantine - no ischemia, low risk   . KNEE ARTHROSCOPY Bilateral   . left wrist ganglion cyst excision  2010  . [facility]   right iliac crest bone graft+metal instrumentation; 2005 metal removal and decompression, 2011 lumbar decompression 4-11, then stabilization/ instrumentation done 09-19-10: L2,L3, L5 left and L2 , L3, L4 right pedical remnant L4 left embedded. Left iliac crest bone graft-- Dr Velna Ochs  . LUMBAR SPINE SURGERY  02/10/2016   Dr. Velna Ochs: lumbar  decompression, instrumentation removal, and fusion exploration--HELPED her a lot, esp her radicular leg pains.  . OOPHORECTOMY Right    cyst  . TONSILLECTOMY  61 yrs old  . TONSILLECTOMY    . TRANSTHORACIC ECHOCARDIOGRAM  2006   EF=>55%, trace MR, mild TR, trace AV regurg, trace pulm valve regurg     Outpatient Medications Prior to Visit  Medication Sig Dispense Refill  . acetaminophen (TYLENOL) 500 MG tablet Take 500 mg by mouth every 6 (six) hours as needed (pain).    Marland Kitchen albuterol (PROVENTIL HFA;VENTOLIN HFA) 108 (90 Base) MCG/ACT inhaler Inhale 2 puffs into the lungs every 4 (four) hours as needed for wheezing or shortness of breath  (bronchitis). Reported on 03/12/2016 1 Inhaler 2  . albuterol (PROVENTIL) (2.5 MG/3ML) 0.083% nebulizer solution Take 3 mLs (2.5 mg total) by nebulization every 4 (four) hours as needed for wheezing or shortness of breath. 75 mL 1  . atenolol (TENORMIN) 25 MG tablet Take 1 tablet (25 mg total) by mouth 2 (two) times daily. 60 tablet 6  . clopidogrel (PLAVIX) 75 MG tablet Take 1 tablet (75 mg total) by mouth daily. 90 tablet 3  . cyclobenzaprine (FLEXERIL) 10 MG tablet take 1 tablet by mouth every 8 hours if needed 30 tablet 6  . fenofibrate 160 MG tablet take 1 tablet by mouth once daily with food 90 tablet 3  . isosorbide mononitrate (IMDUR) 30 MG 24 hr tablet Take 1 tablet (30 mg total) by mouth daily. 90 tablet 3  . nitroGLYCERIN (NITROSTAT) 0.4 MG SL tablet Place 1 tablet (0.4 mg total) under the tongue every 5 (five) minutes as needed for chest pain (MAX 3 doses.). 25 tablet 3  . pantoprazole (PROTONIX) 40 MG tablet Take 1 tablet (40 mg total) by mouth daily. 90 tablet 3  . rosuvastatin (CRESTOR) 40 MG tablet take 1 tablet once daily 120 tablet 1  . HYDROcodone-acetaminophen (NORCO/VICODIN) 5-325 MG tablet take 1 tablet by mouth every 8 hours if needed for pain 90 tablet 0  . LORazepam (ATIVAN) 1 MG tablet take 1 tablet by mouth twice a day 60 tablet 5   No facility-administered medications prior to visit.     Allergies  Allergen Reactions  . Aspirin Other (See Comments)     GI Bleed  . Beta Adrenergic Blockers Other (See Comments)    REACTION: decreased libido  . Nsaids Other (See Comments)     GI Upset  . Penicillins Swelling  . Prochlorperazine Edisylate Other (See Comments)     Stroke like symptoms  . Sulfonamide Derivatives Other (See Comments)    Unknown allergic reaction  . Amitriptyline Hcl Palpitations     increased heart rate    ROS As per HPI  PE: Blood pressure (!) 145/76, pulse 78, temperature 98 F (36.7 C), temperature source Oral, resp. rate 16, height 5' 4.5"  (1.638 m), weight 195 lb 8 oz (88.7 kg), SpO2 95 %. Gen: Alert, well appearing.  Patient is oriented to person, place, time, and situation. AFFECT: pleasant, lucid thought and speech. CV: RRR, no m/r/g.   LUNGS: CTA bilat, nonlabored resps, good aeration in all lung fields. EXT: no clubbing, cyanosis, or edema.    LABS:  Lab Results  Component Value Date   TSH 0.807 05/10/2015   Lab Results  Component Value Date   WBC 8.3 05/10/2015   HGB 12.8 05/10/2015   HCT 38.1 05/10/2015   MCV 92.0 05/10/2015   PLT 203 05/10/2015   Lab Results  Component Value Date   CREATININE 0.57 12/13/2016   BUN 12 12/13/2016   NA 141 12/13/2016   K 4.6 12/13/2016   CL 104 12/13/2016   CO2 31 12/13/2016   Lab Results  Component Value Date   ALT 20 03/14/2017   AST 19 03/14/2017   ALKPHOS 64 03/14/2017   BILITOT 0.5 03/14/2017   Lab Results  Component Value Date   CHOL 131 06/06/2017   Lab Results  Component Value Date   HDL 41.90 06/06/2017   Lab Results  Component Value Date   LDLCALC 65 06/06/2017   Lab Results  Component Value Date   TRIG 118.0 06/06/2017   Lab Results  Component Value Date   CHOLHDL 3 06/06/2017   Lab Results  Component Value Date   HGBA1C 5.9 03/14/2017   Lab Results  Component Value Date   CKTOTAL 84 06/06/2017   TROPONINI <0.30 10/21/2014    IMPRESSION AND PLAN:  1) COPD: The current medical regimen is effective;  continue present plan and medications. FLu vaccine today.  2) HLD: tolerating rosuvastatin and fenofibrate well.  Lipids 3 mo ago excellent.  3) HTN: BPs are fine at homeon tenormin and imdur.  4) Chronic pain syndrome: osteoarthritis in hands, knees, and feet, plus chronic LBP from osteoarthritis/DDD. CSC and UDS UTD. I printed rx's for vicodin 5/325, 1 tab tid prn, #90 today for this month, Dec 2018, and Jan 2019.  Appropriate fill on/after date was noted on each rx.  5) Chronic anxiety: well controlled with lorazepam 1 mg  bid. New rx with 5 additional RFs given to pt today.  No labs needed today.  An After Visit Summary was printed and given to the patient.  FOLLOW UP: Return in about 3 months (around 12/06/2017) for routine chronic illness f/u.  Signed:  Crissie Sickles, MD           09/05/2017

## 2017-10-17 ENCOUNTER — Telehealth: Payer: Self-pay | Admitting: Family Medicine

## 2017-10-17 DIAGNOSIS — F1190 Opioid use, unspecified, uncomplicated: Secondary | ICD-10-CM

## 2017-10-17 DIAGNOSIS — M5442 Lumbago with sciatica, left side: Principal | ICD-10-CM

## 2017-10-17 DIAGNOSIS — G8929 Other chronic pain: Secondary | ICD-10-CM

## 2017-10-17 MED ORDER — HYDROCODONE-ACETAMINOPHEN 5-325 MG PO TABS: ORAL_TABLET | ORAL | 0 refills | Status: DC

## 2017-10-17 NOTE — Telephone Encounter (Signed)
Please advise. Thanks.  

## 2017-10-17 NOTE — Telephone Encounter (Signed)
Pt advised and voiced understanding. Rx's put up front for p/u.

## 2017-10-17 NOTE — Telephone Encounter (Signed)
OK to bring in rx's for Dec and Jan that I gave her last visit. I printed 2 new rx's with correct fill dates on them.--thx

## 2017-10-17 NOTE — Telephone Encounter (Signed)
Copied from [place]. Topic: Quick Communication - See Telephone Encounter >> Oct 17, 2017  9:11 AM Ether Griffins B wrote: CRM for notification. See Telephone encounter for:  Pt's stating she would have to go 20 days with out her  HYDROcodone-acetaminophen. Due to dates on the prints being wrong. One says on or after 12/31 and the other says on or after 12/04/17. Pt got filled on 09/15/17 and cant get it refilled until the end of Dec. Pt has both rx and can have her husband bring them in and get the corrected copies.  10/17/17.

## 2017-10-24 ENCOUNTER — Other Ambulatory Visit: Payer: Self-pay | Admitting: Family Medicine

## 2017-10-24 NOTE — Telephone Encounter (Signed)
Copied from [place] [id number]. Topic: Quick Communication - See Telephone Encounter >> Oct 24, 2017 10:34 AM Percell Belt A wrote: CRM for notification. See Telephone encounter for: pt called in and needs refill on her  cyclobenzaprine (FLEXERIL) 10 MG tablet [[id number]  Pharmacy- [facility]  in [place]  10/24/17.

## 2017-10-24 NOTE — Telephone Encounter (Signed)
Medication needs approval.Thanks. 

## 2017-10-31 MED ORDER — CYCLOBENZAPRINE HCL 10 MG PO TABS: ORAL_TABLET | ORAL | 6 refills | Status: DC

## 2017-12-04 ENCOUNTER — Encounter: Payer: Self-pay | Admitting: *Deleted

## 2017-12-05 ENCOUNTER — Encounter: Payer: Self-pay | Admitting: Family Medicine

## 2017-12-05 ENCOUNTER — Ambulatory Visit (INDEPENDENT_AMBULATORY_CARE_PROVIDER_SITE_OTHER): Payer: Medicare Other | Admitting: Family Medicine

## 2017-12-05 VITALS — BP 148/85 | HR 86 | Temp 98.4°F | Resp 16 | Ht 64.5 in | Wt 198.8 lb

## 2017-12-05 DIAGNOSIS — R1010 Upper abdominal pain, unspecified: Secondary | ICD-10-CM | POA: Diagnosis not present

## 2017-12-05 DIAGNOSIS — R7303 Prediabetes: Secondary | ICD-10-CM | POA: Diagnosis not present

## 2017-12-05 DIAGNOSIS — I10 Essential (primary) hypertension: Secondary | ICD-10-CM

## 2017-12-05 DIAGNOSIS — G894 Chronic pain syndrome: Secondary | ICD-10-CM

## 2017-12-05 DIAGNOSIS — K2900 Acute gastritis without bleeding: Secondary | ICD-10-CM | POA: Diagnosis not present

## 2017-12-05 DIAGNOSIS — M545 Low back pain: Secondary | ICD-10-CM | POA: Diagnosis not present

## 2017-12-05 DIAGNOSIS — F419 Anxiety disorder, unspecified: Secondary | ICD-10-CM | POA: Diagnosis not present

## 2017-12-05 DIAGNOSIS — M170 Bilateral primary osteoarthritis of knee: Secondary | ICD-10-CM

## 2017-12-05 DIAGNOSIS — G8929 Other chronic pain: Secondary | ICD-10-CM

## 2017-12-05 MED ORDER — METHYLPREDNISOLONE ACETATE 80 MG/ML IJ SUSP
80.0000 mg | Freq: Once | INTRAMUSCULAR | Status: AC
  Administered 2017-12-05: 80 mg via INTRAMUSCULAR

## 2017-12-05 MED ORDER — OXYCODONE HCL 5 MG PO TABS: ORAL_TABLET | ORAL | 0 refills | Status: DC

## 2017-12-05 MED ORDER — SUCRALFATE 1 G PO TABS: 1.0000 g | ORAL_TABLET | Freq: Three times a day (TID) | ORAL | 1 refills | Status: DC

## 2017-12-05 NOTE — Progress Notes (Signed)
OFFICE VISIT  12/05/2017   CC:  Chief Complaint  Patient presents with  . Follow-up    RCI   HPI:    Patient is a 62 y.o. Caucasian female who presents for 3 mo f/u chronic pain, HTN, prediabetes.  Indication for chronic opioid: chronic low back pain.  Opioids rx'd to maximize functioning and quality of life. Medication and dose: Vicodin 5/325, 1 tid prn # pills per month: 90 Last UDS date: 11/14/15 Pain contract signed (Y/N): Y, 03/14/17 Date narcotic database last reviewed (include red flags): today, no red flags.  Pain "all over" worse lately since cold weather.  Low back, knees, fingers all the worst. No radiating pain down legs.  No paresthesias in legs or arms.  Says knees don't turn red but says sometimes they feel tight like they are mildly swollen.   Arthralgias improve some with minimizing activity. Asks if pain med can be increased some.  Most recent vicodin was today, and she takes this daily tid. Most recent lorazepam was this morning--takes this daily. Most recent flexeril was this morning, takes this daily.  Has chronic mild upper abd discomfort, waxes and wanes.  Has had WORSE upper abdominal burning type pain x 3d, constant.  Made worse by drinking sodas, eating food.   No nausea.  No vomiting.  Denies GERD sx's.  No fevers.  No constipation.  No diarrhea with this but she describes some IBS sx's with certain types of food.    BP: she says bp is consistently <130/80, occ goes into 140s with acute pain worsening or anxiety like coming here today.  Chronic anxiety: has been stable/responsive to bid lorazepam.   ROS: no CP, no fevers, no SOB or wheezing lately, no dysuria, no melena/hematochezia, no dizziness, no palpitations.  Past Medical History:  Diagnosis Date  . Atypical chest pain 2007; 2015   cardiolyte neg, echo nl, cath showed mild/nonobstructive LAD disease  . Chronic LBP    Multiple surgeries  . COPD (chronic obstructive pulmonary disease) ([place])   .  DDD (degenerative disc disease)    spinal stenosis  . Fatty liver   . History of GI bleed    NSAIDS  . Hyperlipidemia    mixed  . Microscopic hematuria    full w/ u unrevealing X 2  . Normal nuclear stress test 11/11 and 06/2014   negative, EF normal  . Osteoarthritis of knee 06/30/2012   Right   . Palpitations 2006   48H holter neg  . Prediabetes    Highest A1c 6.1% as of 03/2017  . Recurrent UTI   . Tobacco dependence in remission    Quit fall 2015.  Only occasional bronchitis illness (CT shest 2009 nl- for f/u of ? nodule on CXR)    Past Surgical History:  Procedure Laterality Date  . ABDOMINAL HYSTERECTOMY  1997   DUB  . APPENDECTOMY  1984  . CARDIAC CATHETERIZATION  10/09/2005   no CAD, mildly elevated LVEDP, normal LV function (Dr. Gerrie Nordmann)  . CARDIAC CATHETERIZATION N/A 05/16/2015   Mild non-obstructive CAD--med mgmt.  Procedure: Left Heart Cath and Coronary Angiography;  Surgeon: Pixie Casino, MD;  Location: [place] [facility];  Service: Cardiovascular;  Laterality: N/A;  . CARDIOVASCULAR STRESS TEST  06/2014   normal lexiscan NST  . CARDIOVASCULAR STRESS TEST  2006   persantine - no ischemia, low risk   . KNEE ARTHROSCOPY Bilateral   . left wrist ganglion cyst excision  2010  . LUMBAR  [place]   right iliac crest bone graft+metal instrumentation; 2005 metal removal and decompression, 2011 lumbar decompression 4-11, then stabilization/ instrumentation done 09-19-10: L2,L3, L5 left and L2 , L3, L4 right pedical remnant L4 left embedded. Left iliac crest bone graft-- Dr Velna Ochs  . LUMBAR SPINE SURGERY  02/10/2016   Dr. Velna Ochs: lumbar decompression, instrumentation removal, and fusion exploration--HELPED her a lot, esp her radicular leg pains.  . OOPHORECTOMY Right    cyst  . TONSILLECTOMY  62 yrs old  . TONSILLECTOMY    . TRANSTHORACIC ECHOCARDIOGRAM  2006   EF=>55%, trace MR, mild TR, trace AV regurg, trace pulm valve regurg     Outpatient Medications  Prior to Visit  Medication Sig Dispense Refill  . acetaminophen (TYLENOL) 500 MG tablet Take 500 mg by mouth every 6 (six) hours as needed (pain).    Marland Kitchen albuterol (PROVENTIL HFA;VENTOLIN HFA) 108 (90 Base) MCG/ACT inhaler Inhale 2 puffs into the lungs every 4 (four) hours as needed for wheezing or shortness of breath (bronchitis). Reported on 03/12/2016 1 Inhaler 2  . albuterol (PROVENTIL) (2.5 MG/3ML) 0.083% nebulizer solution Take 3 mLs (2.5 mg total) by nebulization every 4 (four) hours as needed for wheezing or shortness of breath. 75 mL 1  . atenolol (TENORMIN) 25 MG tablet Take 1 tablet (25 mg total) by mouth 2 (two) times daily. 60 tablet 6  . clopidogrel (PLAVIX) 75 MG tablet Take 1 tablet (75 mg total) by mouth daily. 90 tablet 3  . cyclobenzaprine (FLEXERIL) 10 MG tablet take 1 tablet by mouth every 8 hours if needed 30 tablet 6  . fenofibrate 160 MG tablet take 1 tablet by mouth once daily with food 90 tablet 3  . isosorbide mononitrate (IMDUR) 30 MG 24 hr tablet Take 1 tablet (30 mg total) by mouth daily. 90 tablet 3  . LORazepam (ATIVAN) 1 MG tablet Take 1 tablet (1 mg total) by mouth 2 (two) times daily. 60 tablet 5  . nitroGLYCERIN (NITROSTAT) 0.4 MG SL tablet Place 1 tablet (0.4 mg total) under the tongue every 5 (five) minutes as needed for chest pain (MAX 3 doses.). 25 tablet 3  . pantoprazole (PROTONIX) 40 MG tablet Take 1 tablet (40 mg total) by mouth daily. 90 tablet 3  . rosuvastatin (CRESTOR) 40 MG tablet take 1 tablet once daily 120 tablet 1  . HYDROcodone-acetaminophen (NORCO/VICODIN) 5-325 MG tablet take 1 tablet by mouth every 8 hours if needed for pain 90 tablet 0   No facility-administered medications prior to visit.     Allergies  Allergen Reactions  . Aspirin Other (See Comments)     GI Bleed  . Beta Adrenergic Blockers Other (See Comments)    REACTION: decreased libido  . Nsaids Other (See Comments)     GI Upset  . Penicillins Swelling  . Prochlorperazine  Edisylate Other (See Comments)     Stroke like symptoms  . Sulfonamide Derivatives Other (See Comments)    Unknown allergic reaction  . Amitriptyline Hcl Palpitations     increased heart rate    ROS As per HPI  PE: Blood pressure (!) 148/85, pulse 86, temperature 98.4 F (36.9 C), temperature source Oral, resp. rate 16, height 5' 4.5" (1.638 m), weight 198 lb 12 oz (90.2 kg), SpO2 94 %. Body mass index is 33.59 kg/m.  Gen: Alert, well appearing.  Patient is oriented to person, place, time, and situation. AFFECT: pleasant, lucid thought and speech. YOV:ZCHY: no injection, icteris, swelling, or  exudate.  EOMI, PERRLA. Mouth: lips without lesion/swelling.  Oral mucosa pink and moist. Oropharynx without erythema, exudate, or swelling.  CV: RRR, no m/r/g.   LUNGS: CTA bilat, nonlabored resps, good aeration in all lung fields. ABD: soft, ND, BS normal.  She has mod upper abd tenderness diffusely w/out guarding or rebound tenderness.  No hepatospenomegaly or mass.  No bruits. Knees: mild hypertrophy of bony landmarks.  NO erythema or warmth or effusion. Mild pain with flexion/extension.  Mild peripatellar TTP, +patellar grind, minimal spotty joint line tenderness.  No knee instability on either side.  LE strength 5/5 prox/dist bilat. EXT: no clubbing, cyanosis, or edema.    LABS:  Lab Results  Component Value Date   TSH 0.807 05/10/2015   Lab Results  Component Value Date   WBC 8.3 05/10/2015   HGB 12.8 05/10/2015   HCT 38.1 05/10/2015   MCV 92.0 05/10/2015   PLT 203 05/10/2015   Lab Results  Component Value Date   CREATININE 0.57 12/13/2016   BUN 12 12/13/2016   NA 141 12/13/2016   K 4.6 12/13/2016   CL 104 12/13/2016   CO2 31 12/13/2016   Lab Results  Component Value Date   ALT 20 03/14/2017   AST 19 03/14/2017   ALKPHOS 64 03/14/2017   BILITOT 0.5 03/14/2017   Lab Results  Component Value Date   CHOL 131 06/06/2017   Lab Results  Component Value Date   HDL  41.90 06/06/2017   Lab Results  Component Value Date   LDLCALC 65 06/06/2017   Lab Results  Component Value Date   TRIG 118.0 06/06/2017   Lab Results  Component Value Date   CHOLHDL 3 06/06/2017   Lab Results  Component Value Date   HGBA1C 5.9 03/14/2017    IMPRESSION AND PLAN:  1) Chronic pain syndrome: osteoarthritis primarily in L spine, knees, fingers, and neck. Pain worse with persistently cold/damp weather.  We'll switch her pain med to oxycodone 5mg , 1-2 tid prn, #180. I printed rx's for this oxycodone today for Feb, Mar, and April 2019.  Appropriate fill on/after date was noted on each rx. For her knees today, we chose to do steroid injections in each one. CSC UTD. UDS today (should see hydrocodone, lorazepam, and cyclobenzaprine).  Procedure: Therapeutic bilat knee injections.  The patient's clinical condition is marked by substantial pain and/or significant functional disability.  Other conservative therapy has not provided relief, is contraindicated, or not appropriate.  There is a reasonable likelihood that injection will significantly improve the patient's pain and/or functional disability. Cleaned skin with alcohol swab, used anterior approach to enter knee joint, Injected 1 ml of 80mg /ml of depo medrol + 2 ml of 1% plain lidocaine into joint space without resistance.  I did this procedure in BOTH knees today.  No immediate complications.  Patient tolerated procedure well.  Post-injection care discussed, including 20 min of icing 1-2 times in the next 4-8 hours, frequent non weight-bearing ROM exercises over the next few days, and general pain medication management.  2) Acute gastritis suspected (worsening of mild chronic upper abd pain): continue pantoprazole 40mg  qAM. Add carafate 1 g qAC and hs.  Two weeks' supply with 1 RF given today. Check CBC, lipase, CMET, and H pylori Ab.  3) HTN: doing well off meds--just some occ elevations with acute pain/anxiety.  4)  Chronic anxiety: stable on lorazepam 1mg  bid.  No new rx was needed for this med today.  5) Prediabetes: needs to work  more on diet.  Exercise is essentially nil due to her chronic musculoskeletal pain.   Check HbA1c today.  An After Visit Summary was printed and given to the patient.  FOLLOW UP: Return in about 3 months (around 03/04/2018) for routine chronic illness f/u.  Signed:  Crissie Sickles, MD           12/05/2017

## 2017-12-06 LAB — CBC WITH DIFFERENTIAL/PLATELET
BASOS PCT: 1.2 % (ref 0.0–3.0)
Basophils Absolute: 0.1 10*3/uL (ref 0.0–0.1)
EOS PCT: 1.9 % (ref 0.0–5.0)
Eosinophils Absolute: 0.2 10*3/uL (ref 0.0–0.7)
HCT: 45.2 % (ref 36.0–46.0)
Hemoglobin: 15.1 g/dL — ABNORMAL HIGH (ref 12.0–15.0)
LYMPHS ABS: 4 10*3/uL (ref 0.7–4.0)
Lymphocytes Relative: 37.7 % (ref 12.0–46.0)
MCHC: 33.4 g/dL (ref 30.0–36.0)
MCV: 91.4 fl (ref 78.0–100.0)
MONOS PCT: 6 % (ref 3.0–12.0)
Monocytes Absolute: 0.6 10*3/uL (ref 0.1–1.0)
NEUTROS ABS: 5.6 10*3/uL (ref 1.4–7.7)
NEUTROS PCT: 53.2 % (ref 43.0–77.0)
Platelets: 275 10*3/uL (ref 150.0–400.0)
RBC: 4.94 Mil/uL (ref 3.87–5.11)
RDW: 12.9 % (ref 11.5–15.5)
WBC: 10.6 10*3/uL — ABNORMAL HIGH (ref 4.0–10.5)

## 2017-12-06 LAB — H. PYLORI ANTIBODY, IGG: H PYLORI IGG: NEGATIVE

## 2017-12-06 LAB — HEMOGLOBIN A1C: Hgb A1c MFr Bld: 5.9 % (ref 4.6–6.5)

## 2017-12-06 LAB — BASIC METABOLIC PANEL
BUN: 12 mg/dL (ref 6–23)
CHLORIDE: 103 meq/L (ref 96–112)
CO2: 30 meq/L (ref 19–32)
Calcium: 9.9 mg/dL (ref 8.4–10.5)
Creatinine, Ser: 0.68 mg/dL (ref 0.40–1.20)
GFR: 93.34 mL/min (ref >60.00)
Glucose, Bld: 106 mg/dL — ABNORMAL HIGH (ref 70–99)
POTASSIUM: 4.4 meq/L (ref 3.5–5.1)
SODIUM: 142 meq/L (ref 135–145)

## 2017-12-06 LAB — HEPATIC FUNCTION PANEL
ALT: 16 U/L (ref 0–35)
AST: 16 U/L (ref 0–37)
Albumin: 4.6 g/dL (ref 3.5–5.2)
Alkaline Phosphatase: 69 U/L (ref 39–117)
BILIRUBIN DIRECT: 0.2 mg/dL (ref 0.0–0.3)
BILIRUBIN TOTAL: 1 mg/dL (ref 0.2–1.2)
TOTAL PROTEIN: 7.6 g/dL (ref 6.0–8.3)

## 2017-12-06 LAB — LIPASE: LIPASE: 27 U/L (ref 11.0–59.0)

## 2017-12-09 LAB — PAIN MGMT, PROFILE 8 W/CONF, U
6 ACETYLMORPHINE: NEGATIVE ng/mL (ref <10)
ALPHAHYDROXYALPRAZOLAM: NEGATIVE ng/mL (ref <25)
ALPHAHYDROXYTRIAZOLAM: NEGATIVE ng/mL (ref <50)
AMINOCLONAZEPAM: NEGATIVE ng/mL (ref <25)
Alcohol Metabolites: NEGATIVE ng/mL (ref <500)
Alphahydroxymidazolam: NEGATIVE ng/mL (ref <50)
Amphetamines: NEGATIVE ng/mL (ref <500)
BUPRENORPHINE, URINE: NEGATIVE ng/mL (ref <5)
Benzodiazepines: POSITIVE ng/mL — AB (ref <100)
CODEINE: NEGATIVE ng/mL (ref <50)
Cocaine Metabolite: NEGATIVE ng/mL (ref <150)
Creatinine: 67.3 mg/dL
HYDROCODONE: 346 ng/mL — AB (ref <50)
HYDROMORPHONE: 336 ng/mL — AB (ref <50)
HYDROXYETHYLFLURAZEPAM: NEGATIVE ng/mL (ref <50)
Lorazepam: 858 ng/mL — ABNORMAL HIGH (ref <50)
MARIJUANA METABOLITE: NEGATIVE ng/mL (ref <20)
MDA: NEGATIVE ng/mL (ref <200)
MDMA: NEGATIVE ng/mL (ref <200)
MDMA: NEGATIVE ng/mL (ref <500)
Morphine: NEGATIVE ng/mL (ref <50)
NORHYDROCODONE: 666 ng/mL — AB (ref <50)
Nordiazepam: NEGATIVE ng/mL (ref <50)
OXYCODONE: NEGATIVE ng/mL (ref <100)
Opiates: POSITIVE ng/mL — AB (ref <100)
Oxazepam: NEGATIVE ng/mL (ref <50)
Oxidant: NEGATIVE ug/mL (ref <200)
TEMAZEPAM: NEGATIVE ng/mL (ref <50)
pH: 6.58 (ref 4.5–9.0)

## 2018-01-27 ENCOUNTER — Ambulatory Visit (INDEPENDENT_AMBULATORY_CARE_PROVIDER_SITE_OTHER): Payer: Medicare Other | Admitting: Family Medicine

## 2018-01-27 ENCOUNTER — Encounter: Payer: Self-pay | Admitting: Family Medicine

## 2018-01-27 VITALS — BP 139/89 | HR 85 | Temp 99.3°F | Resp 20 | Ht 64.0 in | Wt 194.0 lb

## 2018-01-27 DIAGNOSIS — J441 Chronic obstructive pulmonary disease with (acute) exacerbation: Secondary | ICD-10-CM

## 2018-01-27 DIAGNOSIS — J0100 Acute maxillary sinusitis, unspecified: Secondary | ICD-10-CM

## 2018-01-27 DIAGNOSIS — R6889 Other general symptoms and signs: Secondary | ICD-10-CM

## 2018-01-27 DIAGNOSIS — R062 Wheezing: Secondary | ICD-10-CM | POA: Diagnosis not present

## 2018-01-27 LAB — POC INFLUENZA A&B (BINAX/QUICKVUE)
Influenza A, POC: NEGATIVE
Influenza B, POC: NEGATIVE

## 2018-01-27 MED ORDER — PREDNISONE 50 MG PO TABS: 50.0000 mg | ORAL_TABLET | Freq: Every day | ORAL | 0 refills | Status: DC

## 2018-01-27 MED ORDER — BENZONATATE 100 MG PO CAPS: 100.0000 mg | ORAL_CAPSULE | Freq: Two times a day (BID) | ORAL | 0 refills | Status: DC | PRN

## 2018-01-27 MED ORDER — DOXYCYCLINE HYCLATE 100 MG PO TABS: 100.0000 mg | ORAL_TABLET | Freq: Two times a day (BID) | ORAL | 0 refills | Status: DC

## 2018-01-27 MED ORDER — IPRATROPIUM-ALBUTEROL 0.5-2.5 (3) MG/3ML IN SOLN
3.0000 mL | Freq: Once | RESPIRATORY_TRACT | Status: AC
  Administered 2018-01-27: 3 mL via RESPIRATORY_TRACT

## 2018-01-27 NOTE — Progress Notes (Signed)
Brenda [name] , 01/31/1956, 62 y.o., female MRN: [id number] Patient Care Team    Relationship Specialty Notifications Start End  Tammi Sou, MD PCP - General   11/22/10    Comment: Barnett Abu, MD Consulting Physician Cardiology  06/28/14   Loni Dolly, MD  Orthopedic Surgery  06/06/17     Chief Complaint  Patient presents with  . URI    cough,congestion,fever x 3 days     Subjective: Pt presents for an OV with complaints of cough, congestion and fever of 3 days duration.  Associated symptoms include/T-max 103 F, nausea, fatigue, chills.  She has a history of prior case of pneumonia and COPD.  She reports wheezing and shortness of breath.  She has been using her albuterol every 3-4 hours. She had her flu shot this season. Her husband has also been ill.   Depression screen [facility] 2/9 06/06/2017 06/06/2017  Decreased Interest 0 0  Down, Depressed, Hopeless 0 0  PHQ - 2 Score 0 0    Allergies  Allergen Reactions  . Aspirin Other (See Comments)     GI Bleed  . Beta Adrenergic Blockers Other (See Comments)    REACTION: decreased libido  . Nsaids Other (See Comments)     GI Upset  . Penicillins Swelling  . Prochlorperazine Edisylate Other (See Comments)     Stroke like symptoms  . Sulfonamide Derivatives Other (See Comments)    Unknown allergic reaction  . Amitriptyline Hcl Palpitations     increased heart rate   Social History   Tobacco Use  . Smoking status: Former Smoker    Packs/day: 1.00    Years: 35.00    Pack years: 35.00    Types: Cigarettes    Last attempt to quit: 07/22/2014    Years since quitting: 3.5  . Smokeless tobacco: Never Used  . Tobacco comment: down to ~2 cigarettes/daily (06/23/14) - Quit Smoking around 07/20/14!  Substance Use Topics  . Alcohol use: No    Alcohol/week: 0.0 oz   Past Medical History:  Diagnosis Date  . Atypical chest pain 2007; 2015   cardiolyte neg, echo nl, cath showed mild/nonobstructive LAD disease    . Chronic LBP    Multiple surgeries  . COPD (chronic obstructive pulmonary disease) ([place])   . DDD (degenerative disc disease)    spinal stenosis  . Fatty liver   . History of GI bleed    NSAIDS  . Hyperlipidemia    mixed  . Microscopic hematuria    full w/ u unrevealing X 2  . Normal nuclear stress test 11/11 and 06/2014   negative, EF normal  . Osteoarthritis of knee 06/30/2012   Right   . Palpitations 2006   48H holter neg  . Prediabetes    Highest A1c 6.1% as of 03/2017  . Recurrent UTI   . Tobacco dependence in remission    Quit fall 2015.  Only occasional bronchitis illness (CT shest 2009 nl- for f/u of ? nodule on CXR)   Past Surgical History:  Procedure Laterality Date  . ABDOMINAL HYSTERECTOMY  1997   DUB  . APPENDECTOMY  1984  . CARDIAC CATHETERIZATION  10/09/2005   no CAD, mildly elevated LVEDP, normal LV function (Dr. Gerrie Nordmann)  . CARDIAC CATHETERIZATION N/A 05/16/2015   Mild non-obstructive CAD--med mgmt.  Procedure: Left Heart Cath and Coronary Angiography;  Surgeon: Pixie Casino, MD;  Location: [place] [facility];  Service: Cardiovascular;  Laterality: N/A;  . CARDIOVASCULAR STRESS TEST  06/2014   normal lexiscan NST  . CARDIOVASCULAR STRESS TEST  2006   persantine - no ischemia, low risk   . KNEE ARTHROSCOPY Bilateral   . left wrist ganglion cyst excision  2010  . [place]   right iliac crest bone graft+metal instrumentation; 2005 metal removal and decompression, 2011 lumbar decompression 4-11, then stabilization/ instrumentation done 09-19-10: L2,L3, L5 left and L2 , L3, L4 right pedical remnant L4 left embedded. Left iliac crest bone graft-- Dr Velna Ochs  . LUMBAR SPINE SURGERY  02/10/2016   Dr. Velna Ochs: lumbar decompression, instrumentation removal, and fusion exploration--HELPED her a lot, esp her radicular leg pains.  . OOPHORECTOMY Right    cyst  . TONSILLECTOMY  62 yrs old  . TONSILLECTOMY    . TRANSTHORACIC ECHOCARDIOGRAM  2006    EF=>55%, trace MR, mild TR, trace AV regurg, trace pulm valve regurg    Family History  Problem Relation Age of Onset  . Heart Problems Mother        and thyroid problems  . Hyperlipidemia Mother   . Diabetes Mother   . Breast cancer Mother   . Heart failure Father        CHF, heart attack, diabetes, hyperlipidemia  . Heart disease Brother        back problems  . Hypertension Brother   . Kidney failure Maternal Grandmother   . Stroke Maternal Grandfather   . Cancer Paternal Grandmother        mouth - snuff  . Heart attack Paternal Grandfather        stroke, HTN  . Hyperlipidemia Brother   . Heart attack Brother   . Cancer Brother   . Coronary artery disease Neg Hx    Allergies as of 01/27/2018      Reactions   Aspirin Other (See Comments)    GI Bleed   Beta Adrenergic Blockers Other (See Comments)   REACTION: decreased libido   Nsaids Other (See Comments)    GI Upset   Penicillins Swelling   Prochlorperazine Edisylate Other (See Comments)    Stroke like symptoms   Sulfonamide Derivatives Other (See Comments)   Unknown allergic reaction   Amitriptyline Hcl Palpitations    increased heart rate      Medication List        Accurate as of 01/27/18  1:47 PM. Always use your most recent med list.          acetaminophen 500 MG tablet Commonly known as:  TYLENOL Take 500 mg by mouth every 6 (six) hours as needed (pain).   albuterol 108 (90 Base) MCG/ACT inhaler Commonly known as:  PROVENTIL HFA;VENTOLIN HFA Inhale 2 puffs into the lungs every 4 (four) hours as needed for wheezing or shortness of breath (bronchitis). Reported on 03/12/2016   albuterol (2.5 MG/3ML) 0.083% nebulizer solution Commonly known as:  PROVENTIL Take 3 mLs (2.5 mg total) by nebulization every 4 (four) hours as needed for wheezing or shortness of breath.   atenolol 25 MG tablet Commonly known as:  TENORMIN Take 1 tablet (25 mg total) by mouth 2 (two) times daily.   benzonatate 100 MG  capsule Commonly known as:  TESSALON Take 1 capsule (100 mg total) by mouth 2 (two) times daily as needed for cough.   clopidogrel 75 MG tablet Commonly known as:  PLAVIX Take 1 tablet (75 mg total) by mouth daily.   cyclobenzaprine 10 MG tablet Commonly  known as:  FLEXERIL take 1 tablet by mouth every 8 hours if needed   doxycycline 100 MG tablet Commonly known as:  VIBRA-TABS Take 1 tablet (100 mg total) by mouth 2 (two) times daily.   fenofibrate 160 MG tablet take 1 tablet by mouth once daily with food   isosorbide mononitrate 30 MG 24 hr tablet Commonly known as:  IMDUR Take 1 tablet (30 mg total) by mouth daily.   LORazepam 1 MG tablet Commonly known as:  ATIVAN Take 1 tablet (1 mg total) by mouth 2 (two) times daily.   nitroGLYCERIN 0.4 MG SL tablet Commonly known as:  NITROSTAT Place 1 tablet (0.4 mg total) under the tongue every 5 (five) minutes as needed for chest pain (MAX 3 doses.).   oxyCODONE 5 MG immediate release tablet Commonly known as:  Oxy IR/ROXICODONE 1-2 tabs po tid prn severe pain   pantoprazole 40 MG tablet Commonly known as:  PROTONIX Take 1 tablet (40 mg total) by mouth daily.   predniSONE 50 MG tablet Commonly known as:  DELTASONE Take 1 tablet (50 mg total) by mouth daily with breakfast.   rosuvastatin 40 MG tablet Commonly known as:  CRESTOR take 1 tablet once daily   sucralfate 1 g tablet Commonly known as:  CARAFATE Take 1 tablet (1 g total) by mouth 4 (four) times daily -  with meals and at bedtime.       All past medical history, surgical history, allergies, family history, immunizations andmedications were updated in the EMR today and reviewed under the history and medication portions of their EMR.     ROS: Negative, with the exception of above mentioned in HPI   Objective:  BP 139/89 (BP Location: Right Arm, Patient Position: Sitting, Cuff Size: Normal)   Pulse 85   Temp 99.3 F (37.4 C)   Resp 20   Ht 5\' 4"  (1.626  m)   Wt 194 lb (88 kg)   SpO2 95%   BMI 33.30 kg/m  Body mass index is 33.3 kg/m. Gen: febrile. No acute distress. Nontoxic in appearance, well developed, well nourished.  Deep cough present HENT: AT. [place]. Bilateral TM visualized WNL. MMM, no oral lesions. Bilateral nares with erythema, drainage. Throat without erythema or exudates. PND, hoarseness, cough and TTP max sinus.  Eyes:Pupils Equal Round Reactive to light, Extraocular movements intact,  Conjunctiva without redness, discharge or icterus. Neck/lymp/endocrine: Supple,no lymphadenopathy CV: RRR, no edema Chest: bilateral diffuse wheezing with inspiration. Cough w/ deep inspiration.  Abd: Soft. NTND. BS present Neuro:  Normal gait. PERLA. EOMi. Alert. Oriented x3   No exam data present No results found. No results found for this or any previous visit (from the past 24 hour(s)).  Assessment/Plan: ISYS TIETJE is a 62 y.o. female present for OV for  Flu-like symptoms - POC Influenza A&B (Binax test)--> Negative Acute exacerbation of chronic obstructive pulmonary disease (COPD) (HCC) Acute non-recurrent maxillary sinusitis Wheezing Rest, hydrate.  mucinex (DM if cough), nettie pot or nasal saline.  In office duoneb --> improved after treatment. Doxy, prednisone, tessalon perles  prescribed.  If cough present it can last up to 6-8 weeks.  F/U 2 weeks if not improved.     Reviewed expectations re: course of current medical issues.  Discussed self-management of symptoms.  Outlined signs and symptoms indicating need for more acute intervention.  Patient verbalized understanding and all questions were answered.  Patient received an After-Visit Summary.    Orders Placed This Encounter  Procedures  .  POC Influenza A&B (Binax test)     Note is dictated utilizing voice recognition software. Although note has been proof read prior to signing, occasional typographical errors still can be missed. If any questions  arise, please do not hesitate to call for verification.   electronically signed by:  Howard Pouch, DO  [place]

## 2018-01-27 NOTE — Patient Instructions (Signed)
Rest, hydrate.   mucinex (DM if cough), nettie pot or nasal saline.  Doxycyline prescribed, take until completed.  Prednisone burst for 5 days.  Tessalon perles.  If cough present it can last up to 6-8 weeks.  F/U 2 weeks of not improved.     Sinusitis, Adult Sinusitis is soreness and inflammation of your sinuses. Sinuses are hollow spaces in the bones around your face. They are located:  Around your eyes.  In the middle of your forehead.  Behind your nose.  In your cheekbones.  Your sinuses and nasal passages are lined with a stringy fluid (mucus). Mucus normally drains out of your sinuses. When your nasal tissues get inflamed or swollen, the mucus can get trapped or blocked so air cannot flow through your sinuses. This lets bacteria, viruses, and funguses grow, and that leads to infection. Follow these instructions at home: Medicines  Take, use, or apply over-the-counter and prescription medicines only as told by your doctor. These may include nasal sprays.  If you were prescribed an antibiotic medicine, take it as told by your doctor. Do not stop taking the antibiotic even if you start to feel better. Hydrate and Humidify  Drink enough water to keep your pee (urine) clear or pale yellow.  Use a cool mist humidifier to keep the humidity level in your home above 50%.  Breathe in steam for 10-15 minutes, 3-4 times a day or as told by your doctor. You can do this in the bathroom while a hot shower is running.  Try not to spend time in cool or dry air. Rest  Rest as much as possible.  Sleep with your head raised (elevated).  Make sure to get enough sleep each night. General instructions  Put a warm, moist washcloth on your face 3-4 times a day or as told by your doctor. This will help with discomfort.  Wash your hands often with soap and water. If there is no soap and water, use hand sanitizer.  Do not smoke. Avoid being around people who are smoking (secondhand  smoke).  Keep all follow-up visits as told by your doctor. This is important. Contact a doctor if:  You have a fever.  Your symptoms get worse.  Your symptoms do not get better within 10 days. Get help right away if:  You have a very bad headache.  You cannot stop throwing up (vomiting).  You have pain or swelling around your face or eyes.  You have trouble seeing.  You feel confused.  Your neck is stiff.  You have trouble breathing. This information is not intended to replace advice given to you by your health care provider. Make sure you discuss any questions you have with your health care provider. Document Released: 04/09/2008 Document Revised: 06/17/2016 Document Reviewed: 08/17/2015 Elsevier Interactive Patient Education  [employer].

## 2018-03-03 ENCOUNTER — Ambulatory Visit: Payer: Medicare Other | Admitting: Family Medicine

## 2018-03-03 ENCOUNTER — Other Ambulatory Visit: Payer: Self-pay

## 2018-03-03 ENCOUNTER — Telehealth: Payer: Self-pay | Admitting: Family Medicine

## 2018-03-03 MED ORDER — PANTOPRAZOLE SODIUM 40 MG PO TBEC: 40.0000 mg | DELAYED_RELEASE_TABLET | Freq: Every day | ORAL | 0 refills | Status: DC

## 2018-03-03 MED ORDER — ROSUVASTATIN CALCIUM 40 MG PO TABS: 40.0000 mg | ORAL_TABLET | Freq: Every day | ORAL | 0 refills | Status: DC

## 2018-03-03 MED ORDER — FENOFIBRATE 160 MG PO TABS: ORAL_TABLET | ORAL | 0 refills | Status: DC

## 2018-03-03 NOTE — Telephone Encounter (Signed)
Refill sent to pharmacy for 1 month supply.

## 2018-03-03 NOTE — Telephone Encounter (Signed)
Patient needs refill for Pantoprazole, Rosuvastatin, Fenofibrate. Patient's pharmacy ([facility]) has changed it's name to [facility]. It's at the same location.

## 2018-03-03 NOTE — Telephone Encounter (Signed)
Refill request on medications. Appt rescheduled for 03/06/18.

## 2018-03-06 ENCOUNTER — Ambulatory Visit (INDEPENDENT_AMBULATORY_CARE_PROVIDER_SITE_OTHER): Payer: Medicare Other | Admitting: Family Medicine

## 2018-03-06 ENCOUNTER — Encounter: Payer: Self-pay | Admitting: Family Medicine

## 2018-03-06 VITALS — BP 123/79 | HR 67 | Temp 98.5°F | Resp 16 | Ht 64.0 in | Wt 195.4 lb

## 2018-03-06 DIAGNOSIS — F419 Anxiety disorder, unspecified: Secondary | ICD-10-CM

## 2018-03-06 DIAGNOSIS — G8929 Other chronic pain: Secondary | ICD-10-CM | POA: Diagnosis not present

## 2018-03-06 DIAGNOSIS — G894 Chronic pain syndrome: Secondary | ICD-10-CM

## 2018-03-06 DIAGNOSIS — M545 Low back pain: Secondary | ICD-10-CM

## 2018-03-06 DIAGNOSIS — E7800 Pure hypercholesterolemia, unspecified: Secondary | ICD-10-CM

## 2018-03-06 DIAGNOSIS — M170 Bilateral primary osteoarthritis of knee: Secondary | ICD-10-CM

## 2018-03-06 MED ORDER — OXYCODONE HCL 5 MG PO TABS: ORAL_TABLET | ORAL | 0 refills | Status: DC

## 2018-03-06 MED ORDER — METHYLPREDNISOLONE ACETATE 80 MG/ML IJ SUSP
80.0000 mg | Freq: Once | INTRAMUSCULAR | Status: AC
  Administered 2018-03-06: 80 mg via INTRAMUSCULAR

## 2018-03-06 MED ORDER — LORAZEPAM 1 MG PO TABS: 1.0000 mg | ORAL_TABLET | Freq: Two times a day (BID) | ORAL | 5 refills | Status: DC

## 2018-03-06 NOTE — Progress Notes (Signed)
OFFICE VISIT  03/06/2018   CC:  Chief Complaint  Patient presents with  . Follow-up    RCI   HPI:    Patient is a 62 y.o. Caucasian female who presents for 3 mo f/u chronic pain syndrome, chronic anxiety, and mixed hyperlipidemia.  Indication for chronic opioid: chronic bilat LBP w/out sciatica, chronic pain from bilat osteoarthritis of knees. Opioids rx'd to maximize functioning and quality of life. Medication and dose: oxycodone 5mg , 1-2 tid prn. # pills per month: #180 Last UDS date: 12/05/17--appropriate results. Opioid Treatment Agreement signed (Y/N): Y, 03/14/17 Opioid Treatment Agreement last reviewed with patient:  today [place] reviewed this encounter (include red flags):  today, no red flags.  She noted signif improvement in both knees after steroid injections we did 12/05/17--duration of imp about 2 mo. The switch to the stronger pain med has been helpful.  Usually takes 4-6 tabs per day. Chronic back pain is stable: all across low back w/out radiation or paresthesias.  Anxiety: doing well taking lorazepam bid scheduled.  No panic. Mood: no depression or euphoria.  Going to church and reading bible a lot.  HLD: tolerating crestor, no side effects. No exercise due to chronic pain. Diet: on/off healthy.  ROS: no fevers, no rashes, no abd pain, no n/v, no melena or hematochezia, no signif constipation, no extremity weakness.  No paresthesias.  Past Medical History:  Diagnosis Date  . Atypical chest pain 2007; 2015   cardiolyte neg, echo nl, cath showed mild/nonobstructive LAD disease  . Chronic LBP    Multiple surgeries  . COPD (chronic obstructive pulmonary disease) ([place])   . DDD (degenerative disc disease)    spinal stenosis  . Fatty liver   . History of GI bleed    NSAIDS  . Hyperlipidemia    mixed  . Microscopic hematuria    full w/ u unrevealing X 2  . Normal nuclear stress test 11/11 and 06/2014   negative, EF normal  . Osteoarthritis of knee 06/30/2012    Right   . Palpitations 2006   48H holter neg  . Prediabetes    Highest A1c 6.1% as of 03/2017  . Recurrent UTI   . Tobacco dependence in remission    Quit fall 2015.  Only occasional bronchitis illness (CT shest 2009 nl- for f/u of ? nodule on CXR)    Past Surgical History:  Procedure Laterality Date  . ABDOMINAL HYSTERECTOMY  1997   DUB  . APPENDECTOMY  1984  . CARDIAC CATHETERIZATION  10/09/2005   no CAD, mildly elevated LVEDP, normal LV function (Dr. Gerrie Nordmann)  . CARDIAC CATHETERIZATION N/A 05/16/2015   Mild non-obstructive CAD--med mgmt.  Procedure: Left Heart Cath and Coronary Angiography;  Surgeon: Pixie Casino, MD;  Location: [facility];  Service: Cardiovascular;  Laterality: N/A;  . CARDIOVASCULAR STRESS TEST  06/2014   normal lexiscan NST  . CARDIOVASCULAR STRESS TEST  2006   persantine - no ischemia, low risk   . KNEE ARTHROSCOPY Bilateral   . left wrist ganglion cyst excision  2010  . [place]   right iliac crest bone graft+metal instrumentation; 2005 metal removal and decompression, 2011 lumbar decompression 4-11, then stabilization/ instrumentation done 09-19-10: L2,L3, L5 left and L2 , L3, L4 right pedical remnant L4 left embedded. Left iliac crest bone graft-- Dr Velna Ochs  . LUMBAR SPINE SURGERY  02/10/2016   Dr. Velna Ochs: lumbar decompression, instrumentation removal, and fusion exploration--HELPED her a lot, esp her radicular  leg pains.  . OOPHORECTOMY Right    cyst  . TONSILLECTOMY  62 yrs old  . TONSILLECTOMY    . TRANSTHORACIC ECHOCARDIOGRAM  2006   EF=>55%, trace MR, mild TR, trace AV regurg, trace pulm valve regurg     Outpatient Medications Prior to Visit  Medication Sig Dispense Refill  . acetaminophen (TYLENOL) 500 MG tablet Take 500 mg by mouth every 6 (six) hours as needed (pain).    Marland Kitchen albuterol (PROVENTIL HFA;VENTOLIN HFA) 108 (90 Base) MCG/ACT inhaler Inhale 2 puffs into the lungs every 4 (four) hours as needed for wheezing  or shortness of breath (bronchitis). Reported on 03/12/2016 1 Inhaler 2  . albuterol (PROVENTIL) (2.5 MG/3ML) 0.083% nebulizer solution Take 3 mLs (2.5 mg total) by nebulization every 4 (four) hours as needed for wheezing or shortness of breath. 75 mL 1  . atenolol (TENORMIN) 25 MG tablet Take 1 tablet (25 mg total) by mouth 2 (two) times daily. 60 tablet 6  . clopidogrel (PLAVIX) 75 MG tablet Take 1 tablet (75 mg total) by mouth daily. 90 tablet 3  . cyclobenzaprine (FLEXERIL) 10 MG tablet take 1 tablet by mouth every 8 hours if needed 30 tablet 6  . fenofibrate 160 MG tablet take 1 tablet by mouth once daily with food 30 tablet 0  . isosorbide mononitrate (IMDUR) 30 MG 24 hr tablet Take 1 tablet (30 mg total) by mouth daily. 90 tablet 3  . nitroGLYCERIN (NITROSTAT) 0.4 MG SL tablet Place 1 tablet (0.4 mg total) under the tongue every 5 (five) minutes as needed for chest pain (MAX 3 doses.). 25 tablet 3  . pantoprazole (PROTONIX) 40 MG tablet Take 1 tablet (40 mg total) by mouth daily. 30 tablet 0  . rosuvastatin (CRESTOR) 40 MG tablet Take 1 tablet (40 mg total) by mouth daily. 30 tablet 0  . LORazepam (ATIVAN) 1 MG tablet Take 1 tablet (1 mg total) by mouth 2 (two) times daily. 60 tablet 5  . oxyCODONE (OXY IR/ROXICODONE) 5 MG immediate release tablet 1-2 tabs po tid prn severe pain 180 tablet 0  . sucralfate (CARAFATE) 1 g tablet Take 1 tablet (1 g total) by mouth 4 (four) times daily -  with meals and at bedtime. (Patient not taking: Reported on 03/06/2018) 60 tablet 1  . benzonatate (TESSALON) 100 MG capsule Take 1 capsule (100 mg total) by mouth 2 (two) times daily as needed for cough. (Patient not taking: Reported on 03/06/2018) 20 capsule 0  . doxycycline (VIBRA-TABS) 100 MG tablet Take 1 tablet (100 mg total) by mouth 2 (two) times daily. (Patient not taking: Reported on 03/06/2018) 20 tablet 0  . predniSONE (DELTASONE) 50 MG tablet Take 1 tablet (50 mg total) by mouth daily with breakfast. (Patient  not taking: Reported on 03/06/2018) 5 tablet 0   No facility-administered medications prior to visit.     Allergies  Allergen Reactions  . Aspirin Other (See Comments)     GI Bleed  . Beta Adrenergic Blockers Other (See Comments)    REACTION: decreased libido  . Nsaids Other (See Comments)     GI Upset  . Penicillins Swelling  . Prochlorperazine Edisylate Other (See Comments)     Stroke like symptoms  . Sulfonamide Derivatives Other (See Comments)    Unknown allergic reaction  . Amitriptyline Hcl Palpitations     increased heart rate    ROS As per HPI  PE: Blood pressure 123/79, pulse 67, temperature 98.5 F (36.9 C), temperature source  Oral, resp. rate 16, height 5\' 4"  (1.626 m), weight 195 lb 6 oz (88.6 kg), SpO2 94 %. Gen: Alert, well appearing.  Patient is oriented to person, place, time, and situation. AFFECT: pleasant, lucid thought and speech. Bilat hypertrophic bony changes of both knees. ROM: full extension causes pain.  Pt can only actively flex each knee to about 45 deg due to pain/stiffenss. +Patellar grind bilat.  +pes anserine TTP and mild swelling bilat in these bursae. Mild med and lat joint line tenderness in each knee.  No signif effusion on either knee.  No warmth or erythema of either knee.   LABS:  Lab Results  Component Value Date   TSH 0.807 05/10/2015   Lab Results  Component Value Date   WBC 10.6 (H) 12/05/2017   HGB 15.1 (H) 12/05/2017   HCT 45.2 12/05/2017   MCV 91.4 12/05/2017   PLT 275.0 12/05/2017  No results found for: IRON, TIBC, FERRITIN No results found for: VITAMINB12  Lab Results  Component Value Date   CREATININE 0.68 12/05/2017   BUN 12 12/05/2017   NA 142 12/05/2017   K 4.4 12/05/2017   CL 103 12/05/2017   CO2 30 12/05/2017   Lab Results  Component Value Date   ALT 16 12/05/2017   AST 16 12/05/2017   ALKPHOS 69 12/05/2017   BILITOT 1.0 12/05/2017   Lab Results  Component Value Date   CHOL 131 06/06/2017   Lab  Results  Component Value Date   HDL 41.90 06/06/2017   Lab Results  Component Value Date   LDLCALC 65 06/06/2017   Lab Results  Component Value Date   TRIG 118.0 06/06/2017   Lab Results  Component Value Date   CHOLHDL 3 06/06/2017   Lab Results  Component Value Date   HGBA1C 5.9 12/05/2017    IMPRESSION AND PLAN:  1) Chronic pain syndrome: LB stable, but both knees worse again after having improved significantly for 2 mo with steroid injections 3 mo ago.  Pt did desire steroid injections in both knees again today. She does have some pes anserine bursitis on exam bilat, but we chose not to inject these areas today.  Procedure: Therapeutic knee injection.  The patient's clinical condition is marked by substantial pain and/or significant functional disability.  Other conservative therapy has not provided relief, is contraindicated, or not appropriate.  There is a reasonable likelihood that injection will significantly improve the patient's pain and/or functional disability. Cleaned skin with alcohol swab, used anterior approach to enter knee joint, Injected 80mg  depo medrol + 2  ml of 1% plain lidocaine into joint space without resistance.  I injected both knees in this manner today. No immediate complications.  Patient tolerated procedure well.  Post-injection care discussed, including 20 min of icing 1-2 times in the next 4-8 hours, frequent non weight-bearing ROM exercises over the next few days, and general pain medication management.  CSC in place/UTD---will need to renew this at next f/u in 3 mo. UDS 11/2017 with appropriate results.  2) Chronic anxiety: The current medical regimen is effective;  continue present plan and medications. New rx for lorazepam 1mg , 1 bid, #60, RF x 5 given to pt today.  3) Hyperlipidemia, mixed: tolerating statin and fibrate. She is fasting today so I will check FLP. Hepatic panel normal 11/2017.  An After Visit Summary was printed and given to  the patient.  FOLLOW UP: Return in about 3 months (around 06/06/2018) for routine chronic illness f/u (chronic pain).  Signed:  Crissie Sickles, MD           03/06/2018

## 2018-04-04 ENCOUNTER — Telehealth: Payer: Self-pay | Admitting: Family Medicine

## 2018-04-04 NOTE — Telephone Encounter (Signed)
SW Dr. Anitra Lauth pt needs office visit first.  Pt advised and voiced understanding.  Apt made for 04/07/18 at 11:15am.

## 2018-04-04 NOTE — Telephone Encounter (Signed)
Copied from [place] [id number]. Topic: Quick Communication - See Telephone Encounter >> Apr 04, 2018  9:10 AM Synthia Innocent wrote: CRM for notification. See Telephone encounter for: 04/04/18. Patient hit her right knee yesterday on the steps, requesting xray then follow up with Dr Anitra Lauth once requests are received. Please advise

## 2018-04-07 ENCOUNTER — Encounter: Payer: Self-pay | Admitting: Family Medicine

## 2018-04-07 ENCOUNTER — Ambulatory Visit (INDEPENDENT_AMBULATORY_CARE_PROVIDER_SITE_OTHER): Payer: Medicare Other | Admitting: Family Medicine

## 2018-04-07 ENCOUNTER — Ambulatory Visit (HOSPITAL_COMMUNITY)
Admission: RE | Admit: 2018-04-07 | Discharge: 2018-04-07 | Disposition: A | Discharge status: Home / Self Care | Payer: Medicare Other | Source: Ambulatory Visit | Attending: Family Medicine | Admitting: Family Medicine

## 2018-04-07 VITALS — BP 119/76 | HR 79 | Temp 98.6°F | Resp 16 | Ht 64.0 in | Wt 199.2 lb

## 2018-04-07 DIAGNOSIS — M25561 Pain in right knee: Secondary | ICD-10-CM

## 2018-04-07 DIAGNOSIS — S8000XA Contusion of unspecified knee, initial encounter: Secondary | ICD-10-CM

## 2018-04-07 DIAGNOSIS — M1711 Unilateral primary osteoarthritis, right knee: Secondary | ICD-10-CM | POA: Diagnosis not present

## 2018-04-07 NOTE — Patient Instructions (Addendum)
Use your walker to minimize bearing your weight on R knee.  You may take an extra 1-2 oxycodone per day to help with the pain in the short term.  Continue to apply ice to the area of pain for 20 min daily.  Call or return if not improved significantly in 7-10 days.  R knee x-ray ordered.

## 2018-04-07 NOTE — Progress Notes (Signed)
OFFICE VISIT  04/07/2018   CC:  Chief Complaint  Patient presents with  . Knee Pain    fell on right knee   HPI:    Patient is a 62 y.o. Caucasian female who presents for R knee pain. + Hx bilat (R>>L) knee osteoarthritis.  6 nights ago, her R knee buckled going up stairs and knee hit the next step. Acute pain since then--> icing regularly helps some. It swelled up and bruised some in area at inferior aspect of patella. Has to limp.  Only mildly painful (her baseline arthritis pain) when not bearing wt. Worse with wt bearing.  MRI R knee w/out contrast on 11/12/2012: IMPRESSION: 1.  Surgical changes involving the medial meniscus with minimal residual meniscal tissue in the posterior horn.  No obvious recurrent tear. 2.  Medial compartment degenerative changes and possible dysfunctional medial meniscus. 3.  Intact ligamentous structures. 4.  Small joint effusion and very small Baker's cyst.  Past Medical History:  Diagnosis Date  . Atypical chest pain 2007; 2015   cardiolyte neg, echo nl, cath showed mild/nonobstructive LAD disease  . Chronic LBP    Multiple surgeries  . COPD (chronic obstructive pulmonary disease) ([place])   . DDD (degenerative disc disease)    spinal stenosis  . Fatty liver   . History of GI bleed    NSAIDS  . Hyperlipidemia    mixed  . Microscopic hematuria    full w/ u unrevealing X 2  . Normal nuclear stress test 11/11 and 06/2014   negative, EF normal  . Osteoarthritis of knee 06/30/2012   Right   . Palpitations 2006   48H holter neg  . Prediabetes    Highest A1c 6.1% as of 03/2017  . Recurrent UTI   . Tobacco dependence in remission    Quit fall 2015.  Only occasional bronchitis illness (CT shest 2009 nl- for f/u of ? nodule on CXR)    Past Surgical History:  Procedure Laterality Date  . ABDOMINAL HYSTERECTOMY  1997   DUB  . APPENDECTOMY  1984  . CARDIAC CATHETERIZATION  10/09/2005   no CAD, mildly elevated LVEDP, normal LV function (Dr.  Gerrie Nordmann)  . CARDIAC CATHETERIZATION N/A 05/16/2015   Mild non-obstructive CAD--med mgmt.  Procedure: Left Heart Cath and Coronary Angiography;  Surgeon: Pixie Casino, MD;  Location: [facility];  Service: Cardiovascular;  Laterality: N/A;  . CARDIOVASCULAR STRESS TEST  06/2014   normal lexiscan NST  . CARDIOVASCULAR STRESS TEST  2006   persantine - no ischemia, low risk   . KNEE ARTHROSCOPY Bilateral   . left wrist ganglion cyst excision  2010  . [place]   right iliac crest bone graft+metal instrumentation; 2005 metal removal and decompression, 2011 lumbar decompression 4-11, then stabilization/ instrumentation done 09-19-10: L2,L3, L5 left and L2 , L3, L4 right pedical remnant L4 left embedded. Left iliac crest bone graft-- Dr Velna Ochs  . LUMBAR SPINE SURGERY  02/10/2016   Dr. Velna Ochs: lumbar decompression, instrumentation removal, and fusion exploration--HELPED her a lot, esp her radicular leg pains.  . OOPHORECTOMY Right    cyst  . TONSILLECTOMY  62 yrs old  . TONSILLECTOMY    . TRANSTHORACIC ECHOCARDIOGRAM  2006   EF=>55%, trace MR, mild TR, trace AV regurg, trace pulm valve regurg     Outpatient Medications Prior to Visit  Medication Sig Dispense Refill  . acetaminophen (TYLENOL) 500 MG tablet Take 500 mg by mouth every 6 (  six) hours as needed (pain).    Marland Kitchen albuterol (PROVENTIL HFA;VENTOLIN HFA) 108 (90 Base) MCG/ACT inhaler Inhale 2 puffs into the lungs every 4 (four) hours as needed for wheezing or shortness of breath (bronchitis). Reported on 03/12/2016 1 Inhaler 2  . albuterol (PROVENTIL) (2.5 MG/3ML) 0.083% nebulizer solution Take 3 mLs (2.5 mg total) by nebulization every 4 (four) hours as needed for wheezing or shortness of breath. 75 mL 1  . atenolol (TENORMIN) 25 MG tablet Take 1 tablet (25 mg total) by mouth 2 (two) times daily. 60 tablet 6  . clopidogrel (PLAVIX) 75 MG tablet Take 1 tablet (75 mg total) by mouth daily. 90 tablet 3  . cyclobenzaprine  (FLEXERIL) 10 MG tablet take 1 tablet by mouth every 8 hours if needed 30 tablet 6  . fenofibrate 160 MG tablet take 1 tablet by mouth once daily with food 30 tablet 0  . isosorbide mononitrate (IMDUR) 30 MG 24 hr tablet Take 1 tablet (30 mg total) by mouth daily. 90 tablet 3  . LORazepam (ATIVAN) 1 MG tablet Take 1 tablet (1 mg total) by mouth 2 (two) times daily. 60 tablet 5  . nitroGLYCERIN (NITROSTAT) 0.4 MG SL tablet Place 1 tablet (0.4 mg total) under the tongue every 5 (five) minutes as needed for chest pain (MAX 3 doses.). 25 tablet 3  . oxyCODONE (OXY IR/ROXICODONE) 5 MG immediate release tablet 1-2 tabs po tid prn severe pain 180 tablet 0  . pantoprazole (PROTONIX) 40 MG tablet Take 1 tablet (40 mg total) by mouth daily. 30 tablet 0  . rosuvastatin (CRESTOR) 40 MG tablet Take 1 tablet (40 mg total) by mouth daily. 30 tablet 0  . sucralfate (CARAFATE) 1 g tablet Take 1 tablet (1 g total) by mouth 4 (four) times daily -  with meals and at bedtime. 60 tablet 1   No facility-administered medications prior to visit.     Allergies  Allergen Reactions  . Aspirin Other (See Comments)     GI Bleed  . Beta Adrenergic Blockers Other (See Comments)    REACTION: decreased libido  . Nsaids Other (See Comments)     GI Upset  . Penicillins Swelling  . Prochlorperazine Edisylate Other (See Comments)     Stroke like symptoms  . Sulfonamide Derivatives Other (See Comments)    Unknown allergic reaction  . Amitriptyline Hcl Palpitations     increased heart rate    ROS As per HPI  PE: Blood pressure 119/76, pulse 79, temperature 98.6 F (37 C), temperature source Oral, resp. rate 16, height 5\' 4"  (1.626 m), weight 199 lb 4 oz (90.4 kg), SpO2 96 %.  Exam chaperoned by Starla Link, CMA.  Gen: Alert, well appearing.  Patient is oriented to person, place, time, and situation. AFFECT: pleasant, lucid thought and speech. Right knee: mild TTP over inferior 1/3 of patella and over patellar tendon.   No joint line tenderness, no pes anserine tenderness.  No erythema, warmth, or effusion.  Minimal STS anterior R knee.  No bruising. Full flexion elicits mild pain/increased "tight" feeling in joint.  No instability.   LABS:    Chemistry      Component Value Date/Time   NA 142 12/05/2017 1541   K 4.4 12/05/2017 1541   CL 103 12/05/2017 1541   CO2 30 12/05/2017 1541   BUN 12 12/05/2017 1541   CREATININE 0.68 12/05/2017 1541   CREATININE 0.69 06/29/2011 1607      Component Value Date/Time  CALCIUM 9.9 12/05/2017 1541   ALKPHOS 69 12/05/2017 1541   AST 16 12/05/2017 1541   ALT 16 12/05/2017 1541   BILITOT 1.0 12/05/2017 1541     Lab Results  Component Value Date   HGBA1C 5.9 12/05/2017    IMPRESSION AND PLAN:  1) Acute R knee pain-->patella and patellar tendon contusion--r/o patella fracture. This is all superimposed on chronic R knee osteoarthritis pain.  Instructions/plan: Use your walker to minimize bearing your weight on R knee.  You may take an extra 1-2 oxycodone per day to help with the pain in the short term.  Continue to apply ice to the area of pain for 20 min daily.  Call or return if not improved significantly in 7-10 days.  R knee x-ray ordered.  An After Visit Summary was printed and given to the patient.  FOLLOW UP: Return if symptoms worsen or fail to improve.  Signed:  Crissie Sickles, MD           04/07/2018

## 2018-05-05 ENCOUNTER — Other Ambulatory Visit: Payer: Self-pay

## 2018-05-05 MED ORDER — CYCLOBENZAPRINE HCL 10 MG PO TABS: ORAL_TABLET | ORAL | 6 refills | Status: DC

## 2018-06-05 ENCOUNTER — Encounter: Payer: Self-pay | Admitting: Family Medicine

## 2018-06-05 ENCOUNTER — Ambulatory Visit (INDEPENDENT_AMBULATORY_CARE_PROVIDER_SITE_OTHER): Payer: Medicare Other | Admitting: Family Medicine

## 2018-06-05 VITALS — BP 102/66 | HR 83 | Temp 98.6°F | Resp 16 | Ht 64.0 in | Wt 200.1 lb

## 2018-06-05 DIAGNOSIS — G8929 Other chronic pain: Secondary | ICD-10-CM

## 2018-06-05 DIAGNOSIS — M170 Bilateral primary osteoarthritis of knee: Secondary | ICD-10-CM | POA: Diagnosis not present

## 2018-06-05 DIAGNOSIS — M545 Low back pain: Secondary | ICD-10-CM

## 2018-06-05 DIAGNOSIS — R7303 Prediabetes: Secondary | ICD-10-CM

## 2018-06-05 DIAGNOSIS — E7800 Pure hypercholesterolemia, unspecified: Secondary | ICD-10-CM | POA: Diagnosis not present

## 2018-06-05 DIAGNOSIS — I2510 Atherosclerotic heart disease of native coronary artery without angina pectoris: Secondary | ICD-10-CM

## 2018-06-05 DIAGNOSIS — J449 Chronic obstructive pulmonary disease, unspecified: Secondary | ICD-10-CM | POA: Diagnosis not present

## 2018-06-05 DIAGNOSIS — G894 Chronic pain syndrome: Secondary | ICD-10-CM

## 2018-06-05 MED ORDER — OXYCODONE HCL 5 MG PO TABS: ORAL_TABLET | ORAL | 0 refills | Status: DC

## 2018-06-05 MED ORDER — FENOFIBRATE 160 MG PO TABS: ORAL_TABLET | ORAL | 3 refills | Status: DC

## 2018-06-05 MED ORDER — METHYLPREDNISOLONE ACETATE 80 MG/ML IJ SUSP
80.0000 mg | Freq: Once | INTRAMUSCULAR | Status: AC
  Administered 2018-06-05: 80 mg via INTRAMUSCULAR

## 2018-06-05 MED ORDER — PANTOPRAZOLE SODIUM 40 MG PO TBEC: 40.0000 mg | DELAYED_RELEASE_TABLET | Freq: Every day | ORAL | 3 refills | Status: DC

## 2018-06-05 MED ORDER — CLOPIDOGREL BISULFATE 75 MG PO TABS
75.0000 mg | ORAL_TABLET | Freq: Every day | ORAL | 3 refills | Status: DC
Start: 2018-06-05 — End: 2019-09-24

## 2018-06-05 MED ORDER — ROSUVASTATIN CALCIUM 40 MG PO TABS: 40.0000 mg | ORAL_TABLET | Freq: Every day | ORAL | 3 refills | Status: DC

## 2018-06-05 NOTE — Progress Notes (Signed)
OFFICE VISIT  06/05/2018   CC:  Chief Complaint  Patient presents with  . Follow-up    RCI, pt is fasting.    HPI:    Patient is a 62 y.o. Caucasian female who presents for 3 mo f/u chronic pain syndrome, COPD, prediabetes, HLD. Stressed with recent heart troubles that her husband has.  Mixed hyperlipidemia: tolerating statin and fibrate.  I intended to get FLP at last f/u visit a few months ago but it looks like this never happened.  She IS fasting today.  COPD: says breathing has been good.  Has not had to use albuterol in a couple months.  She is still NOT SMOKING.  Prediabetes: trying to work on less fats and carbs, but last 2 mo has been harder b/c husband's health probs. No exercise due to chronic pain/disability.   Chronic pain: Indication for chronic opioid: chronic bilat LBP w/out sciatica, chronic pain from bilat osteoarthritis of knees. Opioids rx'd to improve functioning and quality of life. Medication and dose: oxycodone 5mg , 1-2 tid prn # pills per month: 180 Last UDS date: 11/2017. Opioid Treatment Agreement signed (Y/N): 03/2017. Opioid Treatment Agreement last reviewed with patient: today. [place] reviewed this encounter (include red flags): today, no red flags.  Has chronic feeling of severe bilat knee pain but these do get better for about 2 mo after steroid injections. Currently knees hurt a lot and swell with lots of walking.  Had to use WC recently when visiting husband in hospital. Back pain is the same as usual, without radiation.  Takes 2 oxycodone tabs tid.     Past Medical History:  Diagnosis Date  . Atypical chest pain 2007; 2015   cardiolyte neg, echo nl, cath showed mild/nonobstructive LAD disease  . Chronic LBP    Multiple surgeries  . COPD (chronic obstructive pulmonary disease) ([place])   . DDD (degenerative disc disease)    spinal stenosis  . Fatty liver   . History of GI bleed    NSAIDS  . Hyperlipidemia    mixed  . Microscopic hematuria     full w/ u unrevealing X 2  . Normal nuclear stress test 11/11 and 06/2014   negative, EF normal  . Osteoarthritis of knee 06/30/2012   Right   . Palpitations 2006   48H holter neg  . Prediabetes    Highest A1c 6.1% as of 03/2017  . Recurrent UTI   . Tobacco dependence in remission    Quit fall 2015.  Only occasional bronchitis illness (CT shest 2009 nl- for f/u of ? nodule on CXR)    Past Surgical History:  Procedure Laterality Date  . ABDOMINAL HYSTERECTOMY  1997   DUB  . APPENDECTOMY  1984  . CARDIAC CATHETERIZATION  10/09/2005   no CAD, mildly elevated LVEDP, normal LV function (Dr. Gerrie Nordmann)  . CARDIAC CATHETERIZATION N/A 05/16/2015   Mild non-obstructive CAD--med mgmt.  Procedure: Left Heart Cath and Coronary Angiography;  Surgeon: Pixie Casino, MD;  Location: [facility];  Service: Cardiovascular;  Laterality: N/A;  . CARDIOVASCULAR STRESS TEST  06/2014   normal lexiscan NST  . CARDIOVASCULAR STRESS TEST  2006   persantine - no ischemia, low risk   . KNEE ARTHROSCOPY Bilateral   . left wrist ganglion cyst excision  2010  . [place]   right iliac crest bone graft+metal instrumentation; 2005 metal removal and decompression, 2011 lumbar decompression 4-11, then stabilization/ instrumentation done 09-19-10: L2,L3, L5 left and  L2 , L3, L4 right pedical remnant L4 left embedded. Left iliac crest bone graft-- Dr Velna Ochs  . LUMBAR SPINE SURGERY  02/10/2016   Dr. Velna Ochs: lumbar decompression, instrumentation removal, and fusion exploration--HELPED her a lot, esp her radicular leg pains.  . OOPHORECTOMY Right    cyst  . TONSILLECTOMY  62 yrs old  . TONSILLECTOMY    . TRANSTHORACIC ECHOCARDIOGRAM  2006   EF=>55%, trace MR, mild TR, trace AV regurg, trace pulm valve regurg     Outpatient Medications Prior to Visit  Medication Sig Dispense Refill  . acetaminophen (TYLENOL) 500 MG tablet Take 500 mg by mouth every 6 (six) hours as needed (pain).    Marland Kitchen  albuterol (PROVENTIL HFA;VENTOLIN HFA) 108 (90 Base) MCG/ACT inhaler Inhale 2 puffs into the lungs every 4 (four) hours as needed for wheezing or shortness of breath (bronchitis). Reported on 03/12/2016 1 Inhaler 2  . albuterol (PROVENTIL) (2.5 MG/3ML) 0.083% nebulizer solution Take 3 mLs (2.5 mg total) by nebulization every 4 (four) hours as needed for wheezing or shortness of breath. 75 mL 1  . cyclobenzaprine (FLEXERIL) 10 MG tablet take 1 tablet by mouth every 8 hours if needed 30 tablet 6  . LORazepam (ATIVAN) 1 MG tablet Take 1 tablet (1 mg total) by mouth 2 (two) times daily. 60 tablet 5  . nitroGLYCERIN (NITROSTAT) 0.4 MG SL tablet Place 1 tablet (0.4 mg total) under the tongue every 5 (five) minutes as needed for chest pain (MAX 3 doses.). 25 tablet 3  . sucralfate (CARAFATE) 1 g tablet Take 1 tablet (1 g total) by mouth 4 (four) times daily -  with meals and at bedtime. 60 tablet 1  . atenolol (TENORMIN) 25 MG tablet Take 1 tablet (25 mg total) by mouth 2 (two) times daily. 60 tablet 6  . clopidogrel (PLAVIX) 75 MG tablet Take 1 tablet (75 mg total) by mouth daily. 90 tablet 3  . fenofibrate 160 MG tablet take 1 tablet by mouth once daily with food 30 tablet 0  . isosorbide mononitrate (IMDUR) 30 MG 24 hr tablet Take 1 tablet (30 mg total) by mouth daily. 90 tablet 3  . oxyCODONE (OXY IR/ROXICODONE) 5 MG immediate release tablet 1-2 tabs po tid prn severe pain 180 tablet 0  . pantoprazole (PROTONIX) 40 MG tablet Take 1 tablet (40 mg total) by mouth daily. 30 tablet 0  . rosuvastatin (CRESTOR) 40 MG tablet Take 1 tablet (40 mg total) by mouth daily. 30 tablet 0   No facility-administered medications prior to visit.     Allergies  Allergen Reactions  . Aspirin Other (See Comments)     GI Bleed  . Beta Adrenergic Blockers Other (See Comments)    REACTION: decreased libido  . Nsaids Other (See Comments)     GI Upset  . Penicillins Swelling  . Prochlorperazine Edisylate Other (See  Comments)     Stroke like symptoms  . Sulfonamide Derivatives Other (See Comments)    Unknown allergic reaction  . Amitriptyline Hcl Palpitations     increased heart rate    ROS As per HPI  PE: Blood pressure 102/66, pulse 83, temperature 98.6 F (37 C), temperature source Oral, resp. rate 16, height 5\' 4"  (1.626 m), weight 200 lb 2 oz (90.8 kg), SpO2 92 %. Gen: Alert, well appearing.  Patient is oriented to person, place, time, and situation. AFFECT: pleasant, lucid thought and speech. Knees: no effusions.  Knees w/out excessive warmth.  No erythema. Bilat TTP  around anterior aspects of med and lat joint lines.  Pain at full extension and flexion, with 1+ crepitus bilat.  LABS:  Lab Results  Component Value Date   HGBA1C 5.9 12/05/2017     Chemistry      Component Value Date/Time   NA 142 12/05/2017 1541   K 4.4 12/05/2017 1541   CL 103 12/05/2017 1541   CO2 30 12/05/2017 1541   BUN 12 12/05/2017 1541   CREATININE 0.68 12/05/2017 1541   CREATININE 0.69 06/29/2011 1607      Component Value Date/Time   CALCIUM 9.9 12/05/2017 1541   ALKPHOS 69 12/05/2017 1541   AST 16 12/05/2017 1541   ALT 16 12/05/2017 1541   BILITOT 1.0 12/05/2017 1541     Lab Results  Component Value Date   CHOL 131 06/06/2017   HDL 41.90 06/06/2017   LDLCALC 65 06/06/2017   LDLDIRECT 172.2 07/14/2014   TRIG 118.0 06/06/2017   CHOLHDL 3 06/06/2017   Lab Results  Component Value Date   WBC 10.6 (H) 12/05/2017   HGB 15.1 (H) 12/05/2017   HCT 45.2 12/05/2017   MCV 91.4 12/05/2017   PLT 275.0 12/05/2017   Lab Results  Component Value Date   TSH 0.807 05/10/2015   IMPRESSION AND PLAN:  1) Chronic pain syndrome: back pain is stable. Bilat knee osteoarthritis pain is not well controlled.  She gets good benefit for 2 mo after getting steroid injections in knees. I injected both knees again today.  Procedure: Therapeutic knee injection.  The patient's clinical condition is marked by  substantial pain and/or significant functional disability.  Other conservative therapy has not provided relief, is contraindicated, or not appropriate.  There is a reasonable likelihood that injection will significantly improve the patient's pain and/or functional disability. Cleaned skin with alcohol swab, used anterior approach to enter knee joint, Injected 2  ml of 1% plain lidocaine + 80mg  depo medrol into joint space without resistance.  No immediate complications.  Patient tolerated procedure well.  Post-injection care discussed, including 20 min of icing 1-2 times in the next 4-8 hours, frequent non weight-bearing ROM exercises over the next few days, and general pain medication management. I injected both knee joints in this manner today.   2) COPD: asymptomatic at this time.  Continue albuterol q4h prn.  3) Prediabetes: Not doing very well with diet.  No exercise due to pain/disability. Checking random glucose and hba1c today.  4) Hyperlipidemia, mixed: tolerating crestor 40mg  qd and fenofibrate 160mg  qd. Not doing very well with diet.  No exercise due to pain/disability. Check FLP today.  5) CAD: nonobstructive dz, medical mgmt (atenolol, plavix, imdur, crestor) used to be followed by cardiology but pt's states she has been lost to f/u.  She has not been on any of these meds except her crestor in about 1 yr. She is asymptomatic, so I will resume her plavix 75mg  qd but leave her off of the atenolol and imdur.  An After Visit Summary was printed and given to the patient.  FOLLOW UP: Return in about 3 months (around 09/05/2018) for routine chronic illness f/u.  Signed:  Crissie Sickles, MD           06/05/2018

## 2018-06-06 LAB — BASIC METABOLIC PANEL
BUN: 11 mg/dL (ref 6–23)
CHLORIDE: 104 meq/L (ref 96–112)
CO2: 31 meq/L (ref 19–32)
CREATININE: 0.69 mg/dL (ref 0.40–1.20)
Calcium: 10 mg/dL (ref 8.4–10.5)
GFR: 91.63 mL/min (ref >60.00)
GLUCOSE: 115 mg/dL — AB (ref 70–99)
Potassium: 4.5 mEq/L (ref 3.5–5.1)
Sodium: 143 mEq/L (ref 135–145)

## 2018-06-06 LAB — LIPID PANEL
CHOLESTEROL: 165 mg/dL (ref 0–200)
HDL: 54.7 mg/dL (ref >39.00)
LDL Cholesterol: 91 mg/dL (ref 0–99)
NONHDL: 109.96
TRIGLYCERIDES: 96 mg/dL (ref 0.0–149.0)
Total CHOL/HDL Ratio: 3
VLDL: 19.2 mg/dL (ref 0.0–40.0)

## 2018-06-06 LAB — HEMOGLOBIN A1C: HEMOGLOBIN A1C: 5.9 % (ref 4.6–6.5)

## 2018-08-04 ENCOUNTER — Encounter (HOSPITAL_COMMUNITY): Payer: Self-pay

## 2018-08-04 NOTE — Therapy (Signed)
[facility] [place] [place], [place], [address] Phone: [phone]   Fax:  [phone]  Patient Details  Name: Brenda [name] MRN: [id number] Date of Birth: May 16, 1956 Referring Provider:  No ref. provider found  Encounter Date: 08/04/2018   PHYSICAL THERAPY DISCHARGE SUMMARY  Visits from Start of Care: 4  Current functional level related to goals / functional outcomes: See last note   Remaining deficits: See last note   Education / Equipment: n/a  Plan: Patient agrees to discharge.  Patient goals were not met. Patient is being discharged due to not returning since the last visit.  ?????     Geraldine Solar PT, [place] [address] [place], [place], [address] Phone: [phone]   Fax:  [phone]

## 2018-08-12 ENCOUNTER — Encounter (HOSPITAL_COMMUNITY): Payer: Self-pay | Admitting: Physical Therapy

## 2018-08-21 ENCOUNTER — Ambulatory Visit (INDEPENDENT_AMBULATORY_CARE_PROVIDER_SITE_OTHER): Payer: Medicare Other | Admitting: Family Medicine

## 2018-08-21 ENCOUNTER — Encounter: Payer: Self-pay | Admitting: Family Medicine

## 2018-08-21 VITALS — BP 151/82 | HR 71 | Temp 98.2°F | Resp 16 | Ht 64.0 in | Wt 202.5 lb

## 2018-08-21 DIAGNOSIS — S80861A Insect bite (nonvenomous), right lower leg, initial encounter: Secondary | ICD-10-CM | POA: Diagnosis not present

## 2018-08-21 DIAGNOSIS — B882 Other arthropod infestations: Secondary | ICD-10-CM | POA: Diagnosis not present

## 2018-08-21 DIAGNOSIS — Z23 Encounter for immunization: Secondary | ICD-10-CM | POA: Diagnosis not present

## 2018-08-21 DIAGNOSIS — I2510 Atherosclerotic heart disease of native coronary artery without angina pectoris: Secondary | ICD-10-CM

## 2018-08-21 DIAGNOSIS — R3915 Urgency of urination: Secondary | ICD-10-CM

## 2018-08-21 DIAGNOSIS — R350 Frequency of micturition: Secondary | ICD-10-CM

## 2018-08-21 DIAGNOSIS — R5081 Fever presenting with conditions classified elsewhere: Secondary | ICD-10-CM | POA: Diagnosis not present

## 2018-08-21 DIAGNOSIS — W57XXXA Bitten or stung by nonvenomous insect and other nonvenomous arthropods, initial encounter: Secondary | ICD-10-CM | POA: Diagnosis not present

## 2018-08-21 DIAGNOSIS — J069 Acute upper respiratory infection, unspecified: Secondary | ICD-10-CM | POA: Diagnosis not present

## 2018-08-21 LAB — POCT URINALYSIS DIPSTICK
BILIRUBIN UA: NEGATIVE
GLUCOSE UA: NEGATIVE
Ketones, UA: NEGATIVE
Leukocytes, UA: NEGATIVE
Nitrite, UA: NEGATIVE
PH UA: 6 (ref 5.0–8.0)
Protein, UA: NEGATIVE
RBC UA: NEGATIVE
SPEC GRAV UA: 1.01 (ref 1.010–1.025)
UROBILINOGEN UA: 0.2 U/dL

## 2018-08-21 MED ORDER — DOXYCYCLINE HYCLATE 100 MG PO CAPS: 100.0000 mg | ORAL_CAPSULE | Freq: Two times a day (BID) | ORAL | 0 refills | Status: AC

## 2018-08-21 NOTE — Progress Notes (Signed)
OFFICE VISIT  08/21/2018   CC:  Chief Complaint  Patient presents with  . Tick Removal   HPI:    Patient is a 62 y.o. Caucasian female who presents for recent fever to 101 yesterday. Felt feverish this morning so she took tylenol. Has had a few days of mild nasal congestion/runny nose.  No ST, cough, or HA. No sinus pain or signif pressure.   She noted a black spot on R LL and thinks it may be a tick.  No itching. Mild soreness to touch over the black spot. Joints hurt a lot, but this is not so uncommon for her.  Feels a bit "under the weather".  No n/v.  +Urinary frequency, +urinary urgency. No burning with urination but mild discomfort in urethral region while urinating.  No nausea or abd/flank pain. No change in urine smell or appearance.  Past Medical History:  Diagnosis Date  . Atypical chest pain 2007; 2015   cardiolyte neg, echo nl, cath showed mild/nonobstructive LAD disease  . Chronic LBP    Multiple surgeries  . COPD (chronic obstructive pulmonary disease) ([place])   . DDD (degenerative disc disease)    spinal stenosis  . Fatty liver   . History of GI bleed    NSAIDS  . Hyperlipidemia    mixed  . Microscopic hematuria    full w/ u unrevealing X 2  . Normal nuclear stress test 11/11 and 06/2014   negative, EF normal  . Osteoarthritis of knee 06/30/2012   Right   . Palpitations 2006   48H holter neg  . Prediabetes    Highest A1c 6.1% as of 03/2017  . Recurrent UTI   . Tobacco dependence in remission    Quit fall 2015.  Only occasional bronchitis illness (CT shest 2009 nl- for f/u of ? nodule on CXR)    Past Surgical History:  Procedure Laterality Date  . ABDOMINAL HYSTERECTOMY  1997   DUB  . APPENDECTOMY  1984  . CARDIAC CATHETERIZATION  10/09/2005   no CAD, mildly elevated LVEDP, normal LV function (Dr. Gerrie Nordmann)  . CARDIAC CATHETERIZATION N/A 05/16/2015   Mild non-obstructive CAD--med mgmt.  Procedure: Left Heart Cath and Coronary Angiography;   Surgeon: Pixie Casino, MD;  Location: [facility];  Service: Cardiovascular;  Laterality: N/A;  . CARDIOVASCULAR STRESS TEST  06/2014   normal lexiscan NST  . CARDIOVASCULAR STRESS TEST  2006   persantine - no ischemia, low risk   . KNEE ARTHROSCOPY Bilateral   . left wrist ganglion cyst excision  2010  . [place]   right iliac crest bone graft+metal instrumentation; 2005 metal removal and decompression, 2011 lumbar decompression 4-11, then stabilization/ instrumentation done 09-19-10: L2,L3, L5 left and L2 , L3, L4 right pedical remnant L4 left embedded. Left iliac crest bone graft-- Dr Velna Ochs  . LUMBAR SPINE SURGERY  02/10/2016   Dr. Velna Ochs: lumbar decompression, instrumentation removal, and fusion exploration--HELPED her a lot, esp her radicular leg pains.  . OOPHORECTOMY Right    cyst  . TONSILLECTOMY  62 yrs old  . TONSILLECTOMY    . TRANSTHORACIC ECHOCARDIOGRAM  2006   EF=>55%, trace MR, mild TR, trace AV regurg, trace pulm valve regurg     Outpatient Medications Prior to Visit  Medication Sig Dispense Refill  . acetaminophen (TYLENOL) 500 MG tablet Take 500 mg by mouth every 6 (six) hours as needed (pain).    Marland Kitchen albuterol (PROVENTIL HFA;VENTOLIN HFA) 108 (90  Base) MCG/ACT inhaler Inhale 2 puffs into the lungs every 4 (four) hours as needed for wheezing or shortness of breath (bronchitis). Reported on 03/12/2016 1 Inhaler 2  . albuterol (PROVENTIL) (2.5 MG/3ML) 0.083% nebulizer solution Take 3 mLs (2.5 mg total) by nebulization every 4 (four) hours as needed for wheezing or shortness of breath. 75 mL 1  . clopidogrel (PLAVIX) 75 MG tablet Take 1 tablet (75 mg total) by mouth daily. 90 tablet 3  . cyclobenzaprine (FLEXERIL) 10 MG tablet take 1 tablet by mouth every 8 hours if needed 30 tablet 6  . fenofibrate 160 MG tablet take 1 tablet by mouth once daily with food 90 tablet 3  . LORazepam (ATIVAN) 1 MG tablet Take 1 tablet (1 mg total) by mouth 2 (two) times  daily. 60 tablet 5  . nitroGLYCERIN (NITROSTAT) 0.4 MG SL tablet Place 1 tablet (0.4 mg total) under the tongue every 5 (five) minutes as needed for chest pain (MAX 3 doses.). 25 tablet 3  . oxyCODONE (OXY IR/ROXICODONE) 5 MG immediate release tablet 1-2 tabs po tid prn severe pain 180 tablet 0  . pantoprazole (PROTONIX) 40 MG tablet Take 1 tablet (40 mg total) by mouth daily. 90 tablet 3  . rosuvastatin (CRESTOR) 40 MG tablet Take 1 tablet (40 mg total) by mouth daily. 90 tablet 3  . sucralfate (CARAFATE) 1 g tablet Take 1 tablet (1 g total) by mouth 4 (four) times daily -  with meals and at bedtime. 60 tablet 1   No facility-administered medications prior to visit.     Allergies  Allergen Reactions  . Aspirin Other (See Comments)     GI Bleed  . Beta Adrenergic Blockers Other (See Comments)    REACTION: decreased libido  . Nsaids Other (See Comments)     GI Upset  . Penicillins Swelling  . Prochlorperazine Edisylate Other (See Comments)     Stroke like symptoms  . Sulfonamide Derivatives Other (See Comments)    Unknown allergic reaction  . Amitriptyline Hcl Palpitations     increased heart rate    ROS As per HPI  PE: Blood pressure (!) 151/82, pulse 71, temperature 98.2 F (36.8 C), temperature source Oral, resp. rate 16, height 5\' 4"  (1.626 m), weight 202 lb 8 oz (91.9 kg), SpO2 95 %. VS: noted--normal. Gen: alert, NAD, NONTOXIC APPEARING. HEENT: eyes without injection, drainage, or swelling.  Ears: EACs clear, TMs with normal light reflex and landmarks.  Nose: Clear rhinorrhea, with some dried, crusty exudate adherent to mildly injected mucosa.  No purulent d/c.  No paranasal sinus TTP.  No facial swelling.  Throat and mouth without focal lesion.  No pharyngial swelling, erythema, or exudate.   Neck: supple, no LAD.   LUNGS: CTA bilat, nonlabored resps.   CV: RRR, no m/r/g. EXT: no c/c/e SKIN: no rash. R LL with small pink papule.  I pulled a small brownish core of skin  cells from the center.  NO TICK PRESENT. No surrounding erythema, warmth, or swelling.    LABS:    Chemistry      Component Value Date/Time   NA 143 06/05/2018 1519   K 4.5 06/05/2018 1519   CL 104 06/05/2018 1519   CO2 31 06/05/2018 1519   BUN 11 06/05/2018 1519   CREATININE 0.69 06/05/2018 1519   CREATININE 0.69 06/29/2011 1607      Component Value Date/Time   CALCIUM 10.0 06/05/2018 1519   ALKPHOS 69 12/05/2017 1541   AST 16  12/05/2017 1541   ALT 16 12/05/2017 1541   BILITOT 1.0 12/05/2017 1541     POCT CC UA today: all normal today.  IMPRESSION AND PLAN:  Febrile illness, question of recent tick bite. Her resp sx's are minimal and I don't think a URI is the cause of any fever. UA normal. Treat empirically for tick born illness with doxycycline 100mg  bid x 10d. Signs/symptoms to call or return for were reviewed and pt expressed understanding.  Watchful waiting regarding the insect bite on R LL.  No sign of infection.  An After Visit Summary was printed and given to the patient.  FOLLOW UP: Return if symptoms worsen or fail to improve.  Signed:  Crissie Sickles, MD           08/21/2018

## 2018-08-23 LAB — URINE CULTURE
MICRO NUMBER: 91250648
SPECIMEN QUALITY: ADEQUATE

## 2018-09-01 ENCOUNTER — Other Ambulatory Visit: Payer: Self-pay | Admitting: Family Medicine

## 2018-09-01 NOTE — Telephone Encounter (Signed)
Copied from [place] [phone]. Topic: Quick Communication - See Telephone Encounter >> Sep 01, 2018 11:00 AM Ivar Drape wrote: CRM for notification. See Telephone encounter for: 09/01/18. Patient would like a refill on her oxyCODONE (OXY IR/ROXICODONE) 5 MG immediate release tablet medication and have it sent to her preferred pharmacy [facility] on [address] Linna Hoff, [place]

## 2018-09-02 NOTE — Telephone Encounter (Signed)
Requested medication (s) are due for refill today: yes  Requested medication (s) are on the active medication list: yes  Last refill:  06/05/18  Future visit scheduled: yes  Notes to clinic:  undelegated    Requested Prescriptions  Pending Prescriptions Disp Refills   oxyCODONE (OXY IR/ROXICODONE) 5 MG immediate release tablet 180 tablet 0    Sig: 1-2 tabs po tid prn severe pain     Not Delegated - Analgesics:  Opioid Agonists Failed - 09/01/2018  2:07 PM      Failed - This refill cannot be delegated      Failed - Urine Drug Screen completed in last 360 days.      Passed - Valid encounter within last 6 months    Recent Outpatient Visits          1 week ago Fever in other diseases   [facility] [place], Adrian Blackwater, MD   2 months ago Chronic pain syndrome   [facility], Adrian Blackwater, MD   4 months ago Acute pain of right knee   [facility], Adrian Blackwater, MD   6 months ago Chronic pain syndrome    [place] [facility], Adrian Blackwater, MD   7 months ago Flu-like symptoms   [facility], Reinaldo Raddle, DO      Future Appointments            In 1 week [place], Adrian Blackwater, MD [place], [facility]

## 2018-09-02 NOTE — Telephone Encounter (Signed)
Contacted patient.  Patient is to contact her pharmacy because she should have a RF for 10.23.19.

## 2018-09-11 ENCOUNTER — Encounter: Payer: Self-pay | Admitting: Family Medicine

## 2018-09-11 ENCOUNTER — Ambulatory Visit (INDEPENDENT_AMBULATORY_CARE_PROVIDER_SITE_OTHER): Payer: Medicare Other | Admitting: Family Medicine

## 2018-09-11 VITALS — BP 128/84 | HR 81 | Temp 98.5°F | Resp 16 | Ht 64.0 in | Wt 198.0 lb

## 2018-09-11 DIAGNOSIS — B9789 Other viral agents as the cause of diseases classified elsewhere: Secondary | ICD-10-CM

## 2018-09-11 DIAGNOSIS — G894 Chronic pain syndrome: Secondary | ICD-10-CM

## 2018-09-11 DIAGNOSIS — I2510 Atherosclerotic heart disease of native coronary artery without angina pectoris: Secondary | ICD-10-CM

## 2018-09-11 DIAGNOSIS — G8929 Other chronic pain: Secondary | ICD-10-CM

## 2018-09-11 DIAGNOSIS — M545 Low back pain: Secondary | ICD-10-CM | POA: Diagnosis not present

## 2018-09-11 DIAGNOSIS — J069 Acute upper respiratory infection, unspecified: Secondary | ICD-10-CM

## 2018-09-11 DIAGNOSIS — M170 Bilateral primary osteoarthritis of knee: Secondary | ICD-10-CM

## 2018-09-11 MED ORDER — OXYCODONE HCL 5 MG PO TABS: ORAL_TABLET | ORAL | 0 refills | Status: DC

## 2018-09-11 MED ORDER — LORAZEPAM 1 MG PO TABS: 1.0000 mg | ORAL_TABLET | Freq: Two times a day (BID) | ORAL | 5 refills | Status: DC

## 2018-09-11 MED ORDER — METHYLPREDNISOLONE ACETATE 80 MG/ML IJ SUSP
80.0000 mg | Freq: Once | INTRAMUSCULAR | Status: AC
  Administered 2018-09-11: 80 mg via INTRAMUSCULAR

## 2018-09-11 NOTE — Addendum Note (Signed)
Addended by: Tammi Sou on: 09/11/2018 05:04 PM   Modules accepted: Orders

## 2018-09-11 NOTE — Progress Notes (Addendum)
OFFICE VISIT  09/11/2018   CC:  Chief Complaint  Patient presents with  . Follow-up    RCI, pt is fasting.     HPI:    Patient is a 62 y.o. Caucasian female who presents for 3 mo f/u chronic pain syndrome.  Indication for chronic opioid: chronic bilat LBP w/out sciatica, chronic pain from bilat osteoarthritis of knees. Opioids rx'd to improve functioning and quality of life. Medication and dose: oxycodone 5mg , 1-2 tid prn # pills per month: 180 Last UDS date: 03/14/17 Opioid Treatment Agreement signed (Y/N): 12/05/17 Opioid Treatment Agreement last reviewed with patient: today. [place] reviewed this encounter (include red flags): today, no red flags.  Knees are killing her.   I injected her knees with steroid last visit 3 mo ago, helped L side >R side, effect lasted about 1 mo. LB hurting diffusely since getting cooler lately.   Onset 1-2 d/a nasal congestion, runny nose, sneezing, malaise, coughing. No wheezing or SOB.  T 101 last night.  Scratchy throat.  No n/v/d. No body aches.  Past Medical History:  Diagnosis Date  . Atypical chest pain 2007; 2015   cardiolyte neg, echo nl, cath showed mild/nonobstructive LAD disease  . Chronic LBP    Multiple surgeries  . COPD (chronic obstructive pulmonary disease) ([place])   . DDD (degenerative disc disease)    spinal stenosis  . Fatty liver   . History of GI bleed    NSAIDS  . Hyperlipidemia    mixed  . Microscopic hematuria    full w/ u unrevealing X 2  . Normal nuclear stress test 11/11 and 06/2014   negative, EF normal  . Osteoarthritis of knee 06/30/2012   Right   . Palpitations 2006   48H holter neg  . Prediabetes    Highest A1c 6.1% as of 03/2017  . Recurrent UTI   . Tobacco dependence in remission    Quit fall 2015.  Only occasional bronchitis illness (CT shest 2009 nl- for f/u of ? nodule on CXR)    Past Surgical History:  Procedure Laterality Date  . ABDOMINAL HYSTERECTOMY  1997   DUB  . APPENDECTOMY  1984   . CARDIAC CATHETERIZATION  10/09/2005   no CAD, mildly elevated LVEDP, normal LV function (Dr. Gerrie Nordmann)  . CARDIAC CATHETERIZATION N/A 05/16/2015   Mild non-obstructive CAD--med mgmt.  Procedure: Left Heart Cath and Coronary Angiography;  Surgeon: Pixie Casino, MD;  Location: [facility];  Service: Cardiovascular;  Laterality: N/A;  . CARDIOVASCULAR STRESS TEST  06/2014   normal lexiscan NST  . CARDIOVASCULAR STRESS TEST  2006   persantine - no ischemia, low risk   . KNEE ARTHROSCOPY Bilateral   . left wrist ganglion cyst excision  2010  . [place]   right iliac crest bone graft+metal instrumentation; 2005 metal removal and decompression, 2011 lumbar decompression 4-11, then stabilization/ instrumentation done 09-19-10: L2,L3, L5 left and L2 , L3, L4 right pedical remnant L4 left embedded. Left iliac crest bone graft-- Dr Velna Ochs  . LUMBAR SPINE SURGERY  02/10/2016   Dr. Velna Ochs: lumbar decompression, instrumentation removal, and fusion exploration--HELPED her a lot, esp her radicular leg pains.  . OOPHORECTOMY Right    cyst  . TONSILLECTOMY  62 yrs old  . TONSILLECTOMY    . TRANSTHORACIC ECHOCARDIOGRAM  2006   EF=>55%, trace MR, mild TR, trace AV regurg, trace pulm valve regurg     Outpatient Medications Prior to Visit  Medication  Sig Dispense Refill  . acetaminophen (TYLENOL) 500 MG tablet Take 500 mg by mouth every 6 (six) hours as needed (pain).    Marland Kitchen albuterol (PROVENTIL HFA;VENTOLIN HFA) 108 (90 Base) MCG/ACT inhaler Inhale 2 puffs into the lungs every 4 (four) hours as needed for wheezing or shortness of breath (bronchitis). Reported on 03/12/2016 1 Inhaler 2  . albuterol (PROVENTIL) (2.5 MG/3ML) 0.083% nebulizer solution Take 3 mLs (2.5 mg total) by nebulization every 4 (four) hours as needed for wheezing or shortness of breath. 75 mL 1  . clopidogrel (PLAVIX) 75 MG tablet Take 1 tablet (75 mg total) by mouth daily. 90 tablet 3  . cyclobenzaprine (FLEXERIL)  10 MG tablet take 1 tablet by mouth every 8 hours if needed 30 tablet 6  . fenofibrate 160 MG tablet take 1 tablet by mouth once daily with food 90 tablet 3  . LORazepam (ATIVAN) 1 MG tablet Take 1 tablet (1 mg total) by mouth 2 (two) times daily. 60 tablet 5  . nitroGLYCERIN (NITROSTAT) 0.4 MG SL tablet Place 1 tablet (0.4 mg total) under the tongue every 5 (five) minutes as needed for chest pain (MAX 3 doses.). 25 tablet 3  . oxyCODONE (OXY IR/ROXICODONE) 5 MG immediate release tablet 1-2 tabs po tid prn severe pain 180 tablet 0  . pantoprazole (PROTONIX) 40 MG tablet Take 1 tablet (40 mg total) by mouth daily. 90 tablet 3  . rosuvastatin (CRESTOR) 40 MG tablet Take 1 tablet (40 mg total) by mouth daily. 90 tablet 3  . sucralfate (CARAFATE) 1 g tablet Take 1 tablet (1 g total) by mouth 4 (four) times daily -  with meals and at bedtime. 60 tablet 1   No facility-administered medications prior to visit.     Allergies  Allergen Reactions  . Aspirin Other (See Comments)     GI Bleed  . Beta Adrenergic Blockers Other (See Comments)    REACTION: decreased libido  . Nsaids Other (See Comments)     GI Upset  . Penicillins Swelling  . Prochlorperazine Edisylate Other (See Comments)     Stroke like symptoms  . Sulfonamide Derivatives Other (See Comments)    Unknown allergic reaction  . Amitriptyline Hcl Palpitations     increased heart rate    ROS As per HPI  PE: Blood pressure 128/84, pulse 81, temperature 98.5 F (36.9 C), temperature source Oral, resp. rate 16, height 5\' 4"  (1.626 m), weight 198 lb (89.8 kg), SpO2 94 %. VS: noted--normal. Gen: alert, NAD, NONTOXIC APPEARING. HEENT: eyes without injection, drainage, or swelling.  Ears: EACs clear, TMs with normal light reflex and landmarks.  Nose: Clear rhinorrhea, with some dried, crusty exudate adherent to mildly injected mucosa.  No purulent d/c.  No paranasal sinus TTP.  No facial swelling.  Throat and mouth without focal lesion.   No pharyngial swelling, erythema, or exudate.   Neck: supple, no LAD.   LUNGS: CTA bilat, nonlabored resps.   CV: RRR, no m/r/g. EXT: no c/c/e Knees: bilat bony hypertrophy noted, pain with active and passive knee flexion and extension. Mild diffuse tenderness is palpable.  No erythema or warmth.  Crepitus bilat. SKIN: no rash    LABS:  Lab Results  Component Value Date   TSH 0.807 05/10/2015   Lab Results  Component Value Date   WBC 10.6 (H) 12/05/2017   HGB 15.1 (H) 12/05/2017   HCT 45.2 12/05/2017   MCV 91.4 12/05/2017   PLT 275.0 12/05/2017   Lab Results  Component Value Date   CREATININE 0.69 06/05/2018   BUN 11 06/05/2018   NA 143 06/05/2018   K 4.5 06/05/2018   CL 104 06/05/2018   CO2 31 06/05/2018   Lab Results  Component Value Date   ALT 16 12/05/2017   AST 16 12/05/2017   ALKPHOS 69 12/05/2017   BILITOT 1.0 12/05/2017   Lab Results  Component Value Date   CHOL 165 06/05/2018   Lab Results  Component Value Date   HDL 54.70 06/05/2018   Lab Results  Component Value Date   LDLCALC 91 06/05/2018   Lab Results  Component Value Date   TRIG 96.0 06/05/2018   Lab Results  Component Value Date   CHOLHDL 3 06/05/2018   Lab Results  Component Value Date   HGBA1C 5.9 06/05/2018    IMPRESSION AND PLAN:  1) Chronic pain syndrome: Back is the same, knees are still bad.   Injected both knees today. Refer to ortho/sports med for consideration of hyaluronate derivative injections. Pt very apprehensive about the thought of total knee replacements, wants to avoid this at all costs.  I did electronic rx's for oxycodone 5mg , 1-2 tid prn, 180 today for this month, Dec 2019, and Jan 2020.  Appropriate fill on/after date was noted on each rx.   2) Viral URI with cough. Get otc generic robitussin DM OR Mucinex DM and use as directed on the packaging for cough and congestion. Use otc generic saline nasal spray 2-3 times per day to irrigate/moisturize your  nasal passages. She knows to call/return/got to ED if getting signif wheezing/SOB.   Procedure: Therapeutic knee injections (bilateral).  The patient's clinical condition is marked by substantial pain and/or significant functional disability.  Other conservative therapy has not provided relief, is contraindicated, or not appropriate.  There is a reasonable likelihood that injection will significantly improve the patient's pain and/or functional disability. Cleaned skin with alcohol swab, used anterior approach to enter knee joint, Injected 49ml of 80mg /ml depo medrol + 2  ml of 1% plain lidocain into joint space without resistance.  No immediate complications.  Patient tolerated procedure well.  Post-injection care discussed, including 20 min of icing 1-2 times in the next 4-8 hours, frequent non weight-bearing ROM exercises over the next few days, and general pain medication management. This injection was done on each knee today.   No labs needed today.  An After Visit Summary was printed and given to the patient.  FOLLOW UP: Return in about 3 months (around 12/12/2018).  Signed:  Crissie Sickles, MD           09/11/2018

## 2018-09-15 DIAGNOSIS — M25561 Pain in right knee: Secondary | ICD-10-CM | POA: Diagnosis not present

## 2018-09-15 DIAGNOSIS — M25562 Pain in left knee: Secondary | ICD-10-CM | POA: Diagnosis not present

## 2018-09-16 ENCOUNTER — Telehealth: Payer: Self-pay | Admitting: *Deleted

## 2018-09-16 NOTE — Telephone Encounter (Signed)
Primary cardiologist [facility] Pre-operative Risk Assessment    Request for surgical clearance:  1. What type of surgery is being performed? RIGHT PARTIAL KNEE REPALCEMENT  2. When is this surgery scheduled?  TBD   3. What type of clearance is required (medical clearance vs. Pharmacy clearance to hold med vs. Both)? MEDICAL  4. Are there any medications that need to be held prior to surgery and how long? PLAVIX 75 MG  5. Practice name and name of physician performing surgery? [facility]: DR TIMOTHY MURPHY   6. What is your office phone number [phone]  [facility] [place]   7.   What is your office fax number [phone]  8.   Anesthesia type (None, local, MAC, general) ? UNKNOWN   Brenda [name] 09/16/2018, 11:15 AM  _________________________________________________________________   (provider comments below)

## 2018-09-16 NOTE — Telephone Encounter (Signed)
   Primary Cardiologist:Kenneth C Hilty, MD  Chart reviewed as part of pre-operative protocol coverage. Because of Brenda [name]'s past medical history and time since last visit, he/she will require a follow-up visit in order to better assess preoperative cardiovascular risk.  Pre-op covering staff: - Please schedule appointment and call patient to inform them. - Please contact requesting surgeon's office via preferred method (i.e, phone, fax) to inform them of need for appointment prior to surgery.  Dr. Debara Pickett, can patient hold Plavix if doing well? Please forward your response to P [facility] PREOP.   Thank you   Leanor Kail, PA  09/16/2018, 1:42 PM

## 2018-09-16 NOTE — Telephone Encounter (Signed)
Pt is scheduled to see Jory Sims, NP, 09/17/18 @ 11:00, with the understanding to arrive 15 mins. Pt thanked me for the call and quick appt.

## 2018-09-17 ENCOUNTER — Encounter: Payer: Self-pay | Admitting: Adult Health

## 2018-09-17 ENCOUNTER — Ambulatory Visit (INDEPENDENT_AMBULATORY_CARE_PROVIDER_SITE_OTHER): Payer: Medicare Other | Admitting: Adult Health

## 2018-09-17 VITALS — BP 136/88 | HR 70 | Ht 64.0 in | Wt 199.2 lb

## 2018-09-17 DIAGNOSIS — R9431 Abnormal electrocardiogram [ECG] [EKG]: Secondary | ICD-10-CM | POA: Diagnosis not present

## 2018-09-17 DIAGNOSIS — I2510 Atherosclerotic heart disease of native coronary artery without angina pectoris: Secondary | ICD-10-CM

## 2018-09-17 DIAGNOSIS — I519 Heart disease, unspecified: Secondary | ICD-10-CM

## 2018-09-17 DIAGNOSIS — I10 Essential (primary) hypertension: Secondary | ICD-10-CM

## 2018-09-17 NOTE — Progress Notes (Signed)
Cardiology Office Note   Date:  09/17/2018   ID:  Brenda [name], DOB [birth year], MRN [id number]  PCP:  Tammi Sou, MD  Cardiologist: Dr.Hilty  Chief Complaint  Patient presents with  . Pre-op Exam  . Coronary Artery Disease     History of Present Illness: Brenda [name] is a 62 y.o. female who presents for pre-operative evaluation for partial knee replacement with Dr Caprice Kluver, on date to be determined. She has not been seen by cardiology since 2017, when she was evaluated for chest pain by Dr. Debara Pickett. She has a history of CAD, with prior cardiac cath in 2007 demonstrating non-obstructive disease. Other history of HL. Follow up cardiac cath in 2017 demonstrated proximal to mid LAD lesion 40%, distal LAD lesion 30%, determining no significant changes form prior cath. She was started on Plavix 75 mg daily.   She denies chest pain or DOE. She is deconditioned due to right knee pain. She is medically complaint.   Past Medical History:  Diagnosis Date  . Atypical chest pain 2007; 2015   cardiolyte neg, echo nl, cath showed mild/nonobstructive LAD disease  . Chronic LBP    Multiple surgeries  . COPD (chronic obstructive pulmonary disease) ([place])   . DDD (degenerative disc disease)    spinal stenosis  . Fatty liver   . History of GI bleed    NSAIDS  . Hyperlipidemia    mixed  . Microscopic hematuria    full w/ u unrevealing X 2  . Normal nuclear stress test 11/11 and 06/2014   negative, EF normal  . Osteoarthritis of knee 06/30/2012   Right   . Palpitations 2006   48H holter neg  . Prediabetes    Highest A1c 6.1% as of 03/2017  . Recurrent UTI   . Tobacco dependence in remission    Quit fall 2015.  Only occasional bronchitis illness (CT shest 2009 nl- for f/u of ? nodule on CXR)    Past Surgical History:  Procedure Laterality Date  . ABDOMINAL HYSTERECTOMY  1997   DUB  . APPENDECTOMY  1984  . CARDIAC CATHETERIZATION  10/09/2005   no CAD, mildly elevated LVEDP,  normal LV function (Dr. Gerrie Nordmann)  . CARDIAC CATHETERIZATION N/A 05/16/2015   Mild non-obstructive CAD--med mgmt.  Procedure: Left Heart Cath and Coronary Angiography;  Surgeon: Pixie Casino, MD;  Location: [facility];  Service: Cardiovascular;  Laterality: N/A;  . CARDIOVASCULAR STRESS TEST  06/2014   normal lexiscan NST  . CARDIOVASCULAR STRESS TEST  2006   persantine - no ischemia, low risk   . KNEE ARTHROSCOPY Bilateral   . left wrist ganglion cyst excision  2010  . [place]   right iliac crest bone graft+metal instrumentation; 2005 metal removal and decompression, 2011 lumbar decompression 4-11, then stabilization/ instrumentation done 09-19-10: L2,L3, L5 left and L2 , L3, L4 right pedical remnant L4 left embedded. Left iliac crest bone graft-- Dr Velna Ochs  . LUMBAR SPINE SURGERY  02/10/2016   Dr. Velna Ochs: lumbar decompression, instrumentation removal, and fusion exploration--HELPED her a lot, esp her radicular leg pains.  . OOPHORECTOMY Right    cyst  . TONSILLECTOMY  62 yrs old  . TONSILLECTOMY    . TRANSTHORACIC ECHOCARDIOGRAM  2006   EF=>55%, trace MR, mild TR, trace AV regurg, trace pulm valve regurg      Current Outpatient Medications  Medication Sig Dispense Refill  . acetaminophen (TYLENOL) 500 MG tablet Take 500  mg by mouth every 6 (six) hours as needed (pain).    Marland Kitchen albuterol (PROVENTIL HFA;VENTOLIN HFA) 108 (90 Base) MCG/ACT inhaler Inhale 2 puffs into the lungs every 4 (four) hours as needed for wheezing or shortness of breath (bronchitis). Reported on 03/12/2016 1 Inhaler 2  . albuterol (PROVENTIL) (2.5 MG/3ML) 0.083% nebulizer solution Take 3 mLs (2.5 mg total) by nebulization every 4 (four) hours as needed for wheezing or shortness of breath. 75 mL 1  . clopidogrel (PLAVIX) 75 MG tablet Take 1 tablet (75 mg total) by mouth daily. 90 tablet 3  . cyclobenzaprine (FLEXERIL) 10 MG tablet take 1 tablet by mouth every 8 hours if needed 30 tablet 6  .  fenofibrate 160 MG tablet take 1 tablet by mouth once daily with food 90 tablet 3  . LORazepam (ATIVAN) 1 MG tablet Take 1 tablet (1 mg total) by mouth 2 (two) times daily. 60 tablet 5  . nitroGLYCERIN (NITROSTAT) 0.4 MG SL tablet Place 1 tablet (0.4 mg total) under the tongue every 5 (five) minutes as needed for chest pain (MAX 3 doses.). 25 tablet 3  . oxyCODONE (OXY IR/ROXICODONE) 5 MG immediate release tablet 1-2 tabs po tid prn severe pain 180 tablet 0  . pantoprazole (PROTONIX) 40 MG tablet Take 1 tablet (40 mg total) by mouth daily. 90 tablet 3  . rosuvastatin (CRESTOR) 40 MG tablet Take 1 tablet (40 mg total) by mouth daily. 90 tablet 3  . sucralfate (CARAFATE) 1 g tablet Take 1 tablet (1 g total) by mouth 4 (four) times daily -  with meals and at bedtime. 60 tablet 1   No current facility-administered medications for this visit.     Allergies:   Aspirin; Beta adrenergic blockers; Nsaids; Penicillins; Prochlorperazine edisylate; Sulfonamide derivatives; and Amitriptyline hcl    Social History:  The patient  reports that she quit smoking about 4 years ago. Her smoking use included cigarettes. She has a 35.00 pack-year smoking history. She has never used smokeless tobacco. She reports that she does not drink alcohol or use drugs.   Family History:  The patient's family history includes Breast cancer in her mother; Cancer in her brother and paternal grandmother; Diabetes in her mother; Heart Problems in her mother; Heart attack in her brother and paternal grandfather; Heart disease in her brother; Heart failure in her father; Hyperlipidemia in her brother and mother; Hypertension in her brother; Kidney failure in her maternal grandmother; Stroke in her maternal grandfather.    ROS: All other systems are reviewed and negative. Unless otherwise mentioned in H&P    PHYSICAL EXAM: VS:  BP 136/88   Pulse 70   Ht 5\' 4"  (1.626 m)   Wt 199 lb 3.2 oz (90.4 kg)   SpO2 95%   BMI 34.19 kg/m  ,  BMI Body mass index is 34.19 kg/m. GEN: Well nourished, well developed, in no acute distress HEENT: normal Neck: no JVD, carotid bruits, or masses Cardiac: RRR; 1/6 murmurs, rubs, or gallops,no edema  Respiratory:  Clear to auscultation bilaterally, normal work of breathing GI: soft, nontender, nondistended, + BS MS: no deformity or atrophy Skin: warm and dry, no rash Neuro:  Strength and sensation are intact Psych: euthymic mood, full affect   EKG:  NSR with RBBB, (RBBB is new).   Recent Labs: 12/05/2017: ALT 16; Hemoglobin 15.1; Platelets 275.0 06/05/2018: BUN 11; Creatinine, Ser 0.69; Potassium 4.5; Sodium 143    Lipid Panel    Component Value Date/Time   CHOL  165 06/05/2018 1519   TRIG 96.0 06/05/2018 1519   HDL 54.70 06/05/2018 1519   CHOLHDL 3 06/05/2018 1519   VLDL 19.2 06/05/2018 1519   LDLCALC 91 06/05/2018 1519   LDLDIRECT 172.2 07/14/2014 1437      Wt Readings from Last 3 Encounters:  09/17/18 199 lb 3.2 oz (90.4 kg)  09/11/18 198 lb (89.8 kg)  08/21/18 202 lb 8 oz (91.9 kg)      Other studies Reviewed: Cardiac cath 05/16/2015   Prox LAD to Mid LAD lesion, 40% stenosed.  Dist LAD lesion, 30% stenosed.   Mild non-obstructive CAD. No significant interval change compared to the prior cath report in 2006. Will add plavix 75 mg daily on discharge. Follow-up in 2 weeks.  Pixie Casino, MD, [facility]     ASSESSMENT AND PLAN:  1. CAD: Non-obstructive per cardiac cath 05/16/2015. RBBB is new. I will repeat her echocardiogram for changes in heart function. As she is asymptomatic I will not plan any ischemic testing unless echo is abnormal. Will hold off on cardiac clearance until echo is resulted. Continue Plavix.   2. Pre-Operative evaluation: Will make determination once echo is completed. She has low risk at this time to proceed. Since she has not been seen for two years, will need to evaluate her LV function first.   3 Hypercholesterolemia: Patient remains  on statin therapy . Will need follow up labs when she is seen again for cardiology evaluation.    Current medicines are reviewed at length with the patient today.    Labs/ tests ordered today include:Echo  Phill Myron. [place] Pugh, ANP, AACC   09/17/2018 12:43 PM    [address] Office [phone] Fax [phone]

## 2018-09-17 NOTE — Patient Instructions (Signed)
Medication Instructions:  NO CHANGES- Your physician recommends that you continue on your current medications as directed. Please refer to the Current Medication list given to you today.  If you need a refill on your cardiac medications before your next appointment, please call your pharmacy.  Labwork: If you have labs (blood work) drawn today and your tests are completely normal, you will receive your results only by: Marland Kitchen MyChart Message (if you have MyChart) OR . A paper copy in the mail If you have any lab test that is abnormal or we need to change your treatment, we will call you to review the results.  Testing/Procedures: Echocardiogram - Your physician has requested that you have an echocardiogram. Echocardiography is a painless test that uses sound waves to create images of your heart. It provides your doctor with information about the size and shape of your heart and how well your heart's chambers and valves are working. This procedure takes approximately one hour. There are no restrictions for this procedure. This will be performed at our [facility] location - [address].  Special Instructions: NOT CLEARED-IF ECHO COMES BACK NORMAL YOU WILL BE CLEARED FOR YOUR PROCEDURE  Follow-Up: You will need a follow up appointment in 6 MONTHS.  Please call our office 2 months in advance(MAR 2020) to schedule the (APR 2020) appointment.  You may see  DR Cleon Dew, DNP, ANP or one of the following Advanced Practice Providers on your designated Care Team:    . Almyra Deforest, PA-C . Fabian Sharp, PA-C  At [facility], you and your health needs are our priority.  As part of our continuing mission to provide you with exceptional heart care, we have created designated Provider Care Teams.  These Care Teams include your primary Cardiologist (physician) and Advanced Practice Providers (APPs -  Physician Assistants and Nurse Practitioners) who all work together to provide you with  the care you need, when you need it.  Thank you for choosing [facility] at [facility]!!

## 2018-09-18 ENCOUNTER — Encounter: Payer: Self-pay | Admitting: Family Medicine

## 2018-09-23 ENCOUNTER — Ambulatory Visit (HOSPITAL_COMMUNITY): Payer: Medicare Other | Attending: Cardiology

## 2018-09-23 ENCOUNTER — Other Ambulatory Visit: Payer: Self-pay

## 2018-09-23 DIAGNOSIS — I519 Heart disease, unspecified: Secondary | ICD-10-CM | POA: Insufficient documentation

## 2018-09-23 DIAGNOSIS — R9431 Abnormal electrocardiogram [ECG] [EKG]: Secondary | ICD-10-CM | POA: Diagnosis not present

## 2018-09-23 DIAGNOSIS — I10 Essential (primary) hypertension: Secondary | ICD-10-CM | POA: Insufficient documentation

## 2018-09-27 ENCOUNTER — Other Ambulatory Visit: Payer: Self-pay | Admitting: Family Medicine

## 2018-10-13 DIAGNOSIS — M1711 Unilateral primary osteoarthritis, right knee: Secondary | ICD-10-CM | POA: Diagnosis not present

## 2018-10-13 NOTE — H&P (Signed)
KNEE ARTHROPLASTY ADMISSION H&P  Patient ID: Brenda RUNDQUIST MRN: [id number] DOB/AGE: August 24, 1956 62 y.o.  Chief Complaint: right knee pain.  Planned Procedure Date: 11/04/2018 Medical Clearance, PM&R by Dr. Ernestine Conrad Cardiac Clearance by Dr. Debara Pickett   HPI: Brenda [name] is a 62 y.o. female with a history of COPD, PONV, chronic low back pain, history of GI bleed, and bicuspid AV who presents for evaluation of OA RIGHT KNEE. The patient has a history of pain and functional disability in the right knee due to arthritis and has failed non-surgical conservative treatments for greater than 12 weeks to include corticosteriod injections and activity modification.  Onset of symptoms was gradual, starting >10 years ago with gradually worsening course since that time. The patient noted prior procedures on the knee to include  arthroscopy on the bilaterally knee.  Patient currently rates pain at 10 out of 10 with activity. Patient has night pain, worsening of pain with activity and weight bearing and pain that interferes with activities of daily living.  Patient has evidence of periarticular osteophytes and joint space narrowing by imaging studies.  There is no active infection.  Past Medical History:  Diagnosis Date  . Atypical chest pain 2007; 2015   cardiolyte neg, echo nl, cath showed mild/nonobstructive LAD disease  . Bilateral primary osteoarthritis of knee 06/30/2012   Right >L.  Plan for medial compartment knee arthroplasty as of 09/2018.  Marland Kitchen Chronic LBP    Multiple surgeries  . COPD (chronic obstructive pulmonary disease) ([place])   . DDD (degenerative disc disease)    spinal stenosis  . Fatty liver   . History of GI bleed    NSAIDS  . Hyperlipidemia    mixed  . Microscopic hematuria    full w/ u unrevealing X 2  . Normal nuclear stress test 11/11 and 06/2014   negative, EF normal  . Palpitations 2006   48H holter neg  . Prediabetes    Highest A1c 6.1% as of 03/2017  . Recurrent UTI   .  Tobacco dependence in remission    Quit fall 2015.  Only occasional bronchitis illness (CT shest 2009 nl- for f/u of ? nodule on CXR)   Past Surgical History:  Procedure Laterality Date  . ABDOMINAL HYSTERECTOMY  1997   DUB  . APPENDECTOMY  1984  . CARDIAC CATHETERIZATION  10/09/2005   no CAD, mildly elevated LVEDP, normal LV function (Dr. Gerrie Nordmann)  . CARDIAC CATHETERIZATION N/A 05/16/2015   Mild non-obstructive CAD--med mgmt.  Procedure: Left Heart Cath and Coronary Angiography;  Surgeon: Pixie Casino, MD;  Location: [facility];  Service: Cardiovascular;  Laterality: N/A;  . CARDIOVASCULAR STRESS TEST  06/2014   normal lexiscan NST  . CARDIOVASCULAR STRESS TEST  2006   persantine - no ischemia, low risk   . KNEE ARTHROSCOPY Bilateral   . left wrist ganglion cyst excision  2010  . [place]   right iliac crest bone graft+metal instrumentation; 2005 metal removal and decompression, 2011 lumbar decompression 4-11, then stabilization/ instrumentation done 09-19-10: L2,L3, L5 left and L2 , L3, L4 right pedical remnant L4 left embedded. Left iliac crest bone graft-- Dr Velna Ochs  . LUMBAR SPINE SURGERY  02/10/2016   Dr. Velna Ochs: lumbar decompression, instrumentation removal, and fusion exploration--HELPED her a lot, esp her radicular leg pains.  . OOPHORECTOMY Right    cyst  . TONSILLECTOMY  62 yrs old  . TONSILLECTOMY    . TRANSTHORACIC ECHOCARDIOGRAM  2006  EF=>55%, trace MR, mild TR, trace AV regurg, trace pulm valve regurg    Allergies  Allergen Reactions  . Aspirin Other (See Comments)     GI Bleed  . Beta Adrenergic Blockers Other (See Comments)    REACTION: decreased libido  . Nsaids Other (See Comments)     GI Upset  . Penicillins Swelling  . Prochlorperazine Edisylate Other (See Comments)     Stroke like symptoms  . Sulfonamide Derivatives Other (See Comments)    Unknown allergic reaction  . Amitriptyline Hcl Palpitations     increased heart rate    Prior to Admission medications   Medication Sig Start Date End Date Taking? Authorizing Provider  acetaminophen (TYLENOL) 500 MG tablet Take 500 mg by mouth every 6 (six) hours as needed (pain).    [provider]  albuterol (PROVENTIL HFA;VENTOLIN HFA) 108 (90 Base) MCG/ACT inhaler Inhale 2 puffs into the lungs every 4 (four) hours as needed for wheezing or shortness of breath (bronchitis). Reported on 03/12/2016 03/14/17   Tammi Sou, MD  albuterol (PROVENTIL) (2.5 MG/3ML) 0.083% nebulizer solution Take 3 mLs (2.5 mg total) by nebulization every 4 (four) hours as needed for wheezing or shortness of breath. 03/14/17   McGowen, Adrian Blackwater, MD  clopidogrel (PLAVIX) 75 MG tablet Take 1 tablet (75 mg total) by mouth daily. 06/05/18   McGowen, Adrian Blackwater, MD  cyclobenzaprine (FLEXERIL) 10 MG tablet take 1 tablet by mouth every 8 hours if needed 05/05/18   McGowen, Adrian Blackwater, MD  fenofibrate 160 MG tablet take 1 tablet by mouth once daily with food 06/05/18   McGowen, Adrian Blackwater, MD  LORazepam (ATIVAN) 1 MG tablet Take 1 tablet (1 mg total) by mouth 2 (two) times daily. 09/11/18   McGowen, Adrian Blackwater, MD  nitroGLYCERIN (NITROSTAT) 0.4 MG SL tablet Place 1 tablet (0.4 mg total) under the tongue every 5 (five) minutes as needed for chest pain (MAX 3 doses.). 08/19/14   Pixie Casino, MD  oxyCODONE (OXY IR/ROXICODONE) 5 MG immediate release tablet 1-2 tabs po tid prn severe pain 09/11/18   McGowen, Adrian Blackwater, MD  pantoprazole (PROTONIX) 40 MG tablet Take 1 tablet (40 mg total) by mouth daily. 06/05/18   McGowen, Adrian Blackwater, MD  rosuvastatin (CRESTOR) 40 MG tablet Take 1 tablet (40 mg total) by mouth daily. 06/05/18   McGowen, Adrian Blackwater, MD  sucralfate (CARAFATE) 1 g tablet Take 1 tablet (1 g total) by mouth 4 (four) times daily -  with meals and at bedtime. 12/05/17   McGowen, Adrian Blackwater, MD   Social History   Socioeconomic History  . Marital status: Married    Spouse name: Not on file  . Number of children: Not  on file  . Years of education: Not on file  . Highest education level: Not on file  Occupational History  . Not on file  Social Needs  . Financial resource strain: Not on file  . Food insecurity:    Worry: Not on file    Inability: Not on file  . Transportation needs:    Medical: Not on file    Non-medical: Not on file  Tobacco Use  . Smoking status: Former Smoker    Packs/day: 1.00    Years: 35.00    Pack years: 35.00    Types: Cigarettes    Last attempt to quit: 07/22/2014    Years since quitting: 4.2  . Smokeless tobacco: Never Used  . Tobacco comment: down to ~2  cigarettes/daily (06/23/14) - Quit Smoking around 07/20/14!  Substance and Sexual Activity  . Alcohol use: No    Alcohol/week: 0.0 standard drinks  . Drug use: No  . Sexual activity: Yes    Partners: Male    Birth control/protection: Surgical  Lifestyle  . Physical activity:    Days per week: Not on file    Minutes per session: Not on file  . Stress: Not on file  Relationships  . Social connections:    Talks on phone: Not on file    Gets together: Not on file    Attends religious service: Not on file    Active member of club or organization: Not on file    Attends meetings of clubs or organizations: Not on file    Relationship status: Not on file  Other Topics Concern  . Not on file  Social History Narrative  . Not on file   Family History  Problem Relation Age of Onset  . Heart Problems Mother        and thyroid problems  . Hyperlipidemia Mother   . Diabetes Mother   . Breast cancer Mother   . Heart failure Father        CHF, heart attack, diabetes, hyperlipidemia  . Heart disease Brother        back problems  . Hypertension Brother   . Kidney failure Maternal Grandmother   . Stroke Maternal Grandfather   . Cancer Paternal Grandmother        mouth - snuff  . Heart attack Paternal Grandfather        stroke, HTN  . Hyperlipidemia Brother   . Heart attack Brother   . Cancer Brother   .  Coronary artery disease Neg Hx     ROS: Currently denies lightheadedness, dizziness, Fever, chills, CP, SOB.   No personal history of DVT, PE, MI, or CVA. No loose teeth or dentures All other systems have been reviewed and were otherwise currently negative with the exception of those mentioned in the HPI and as above.  Objective: Vitals: Ht: 5'4" Wt: 198 Temp: 98.1 BP: 134/72 Pulse: 74 O2 95% on room air.   Physical Exam: General: Alert, NAD.  Antalgic Gait  HEENT: EOMI, Good Neck Extension  Pulm: No increased work of breathing.  Clear B/L A/P w/o crackle or wheeze.  CV: RRR, 2/6 SEM GI: soft, NT, ND Neuro: Neuro without gross focal deficit.  Sensation intact distally Skin: No lesions in the area of chief complaint MSK/Surgical Site: Right knee w/o redness or effusion.  medial JLT. ROM 2-100.  5/5 strength in extension and flexion.  +EHL/FHL.  NVI.  Stable varus and valgus stress.    Imaging Review Plain radiographs demonstrate severe degenerative joint disease of the right knee.   Preserved lateral space and good medial opening on stress views   Preoperative templating of the joint replacement has been completed, documented, and submitted to the Operating Room personnel in order to optimize intra-operative equipment management.  Assessment: OA RIGHT KNEE Principal Problem:   Primary osteoarthritis of right knee Active Problems:   COPD (chronic obstructive pulmonary disease) (HCC)   CAD (coronary artery disease)   Chronic low back pain   Bilateral sciatica   Gastroesophageal reflux disease without esophagitis   Generalized anxiety disorder   Spinal stenosis of lumbar region without neurogenic claudication   Plan: Plan for Procedure(s): UNICOMPARTMENTAL KNEE  The patient history, physical exam, clinical judgement of the provider and imaging are consistent with  end stage degenerative joint disease and unicompartmental joint arthroplasty is deemed medically necessary. The  treatment options including medical management, injection therapy, and arthroplasty were discussed at length. The risks and benefits of Procedure(s): UNICOMPARTMENTAL KNEE were presented and reviewed.  The risks of nonoperative treatment, versus surgical intervention including but not limited to continued pain, aseptic loosening, stiffness, dislocation/subluxation, infection, bleeding, nerve injury, blood clots, cardiopulmonary complications, morbidity, mortality, among others were discussed. The patient verbalizes understanding and wishes to proceed with the plan.  Patient is being admitted for inpatient treatment for surgery, pain control, PT, OT, prophylactic antibiotics, VTE prophylaxis, progressive ambulation, ADL's and discharge planning.   Dental prophylaxis discussed and recommended for 2 years postoperatively.   The patient does meet the criteria for TXA which will be used perioperatively.    Remote GI bleed when she was smoking, taking high dose Aspirin, and NSAID.  Risks and benefits of low dose Aspirin for DVT prophylaxis was discussed with the patient and she chooses to take ASA 81 mg BID postoperatively for DVT prophylaxis in addition to SCDs, and early ambulation.  Continue Protonix for gastric protection.  Hold Plavix 7 days prior to surgery per Dr. Debara Pickett.  PCN allergy swelling of body and throat.  No p.o. NSAIDs due to heart history and history of GI bleed.    She will continue chronic pain medicine (oxycodone) and Flexeril.  The patient is planning to be discharged home with home health services ([facility]) in care of her husband.   Patient's anticipated LOS is less than 2 midnights, meeting these requirements: - Younger than 41 - Lives within 1 hour of care - Has a competent adult at home to recover with post-op recover - NO history of  - Diabetes  - Heart failure  - Heart attack  - Stroke  - DVT/VTE  - Cardiac arrhythmia  - Renal failure  - Anemia  - Advanced  Liver disease    Prudencio Burly III, PA-C 10/13/2018 8:31 AM

## 2018-10-16 ENCOUNTER — Telehealth (HOSPITAL_COMMUNITY): Payer: Self-pay

## 2018-10-16 ENCOUNTER — Other Ambulatory Visit: Payer: Self-pay | Admitting: Orthopedic Surgery

## 2018-10-16 NOTE — Care Plan (Signed)
Met with patient and husband in the office. She will discharge to home with family and go straight to OPPT. This will be arranged at [place] at [facility] in [place].   Ladell Heads, [place]

## 2018-10-16 NOTE — Telephone Encounter (Signed)
L/m for pt to call us back to schedule the rest of her apptments 3xwk-4wks [facility] 10/16/18

## 2018-10-22 NOTE — Patient Instructions (Addendum)
Brenda [name]  10/22/2018   Your procedure is scheduled on: 11-04-18    Report to [facility]  [name]     Report to admitting at 5:30AM    Call this number if you have problems the morning of surgery [phone]      Remember: Do not eat food or drink liquids :After Midnight. BRUSH YOUR TEETH MORNING OF SURGERY AND RINSE YOUR MOUTH OUT, NO CHEWING GUM CANDY OR MINTS.     Take these medicines the morning of surgery with A SIP OF WATER: FENOFIBRATE, ATIVAN IF NEEDED, PROTONIX, CRESTOR                                 You may not have any metal on your body including hair pins and              piercings  Do not wear jewelry, make-up, lotions, powders or perfumes, deodorant             Do not wear nail polish.  Do not shave  48 hours prior to surgery.             Do not bring valuables to the hospital. Brenda [name].  Contacts, dentures or bridgework may not be worn into surgery.  Leave suitcase in the car. After surgery it may be brought to your room.                   Please read over the following fact sheets you were given: _____________________________________________________________________             Brenda [name] - Preparing for Surgery Before surgery, you can play an important role.  Because skin is not sterile, your skin needs to be as free of germs as possible.  You can reduce the number of germs on your skin by washing with CHG (chlorahexidine gluconate) soap before surgery.  CHG is an antiseptic cleaner which kills germs and bonds with the skin to continue killing germs even after washing. Please DO NOT use if you have an allergy to CHG or antibacterial soaps.  If your skin becomes reddened/irritated stop using the CHG and inform your nurse when you arrive at Short Stay. Do not shave (including legs and underarms) for at least 48 hours prior to the first CHG shower.  You may shave your  face/neck. Please follow these instructions carefully:  1.  Shower with CHG Soap the night before surgery and the  morning of Surgery.  2.  If you choose to wash your hair, wash your hair first as usual with your  normal  shampoo.  3.  After you shampoo, rinse your hair and body thoroughly to remove the  shampoo.                           4.  Use CHG as you would any other liquid soap.  You can apply chg directly  to the skin and wash                       Gently with a scrungie or clean washcloth.  5.  Apply the CHG Soap to your body  ONLY FROM THE NECK DOWN.   Do not use on face/ open                           Wound or open sores. Avoid contact with eyes, ears mouth and genitals (private parts).                       Wash face,  Genitals (private parts) with your normal soap.             6.  Wash thoroughly, paying special attention to the area where your surgery  will be performed.  7.  Thoroughly rinse your body with warm water from the neck down.  8.  DO NOT shower/wash with your normal soap after using and rinsing off  the CHG Soap.                9.  Pat yourself dry with a clean towel.            10.  Wear clean pajamas.            11.  Place clean sheets on your bed the night of your first shower and do not  sleep with pets. Day of Surgery : Do not apply any lotions/deodorants the morning of surgery.  Please wear clean clothes to the hospital/surgery center.  FAILURE TO FOLLOW THESE INSTRUCTIONS MAY RESULT IN THE CANCELLATION OF YOUR SURGERY PATIENT SIGNATURE_________________________________  NURSE SIGNATURE__________________________________  ________________________________________________________________________   Brenda [name]  An incentive spirometer is a tool that can help keep your lungs clear and active. This tool measures how well you are filling your lungs with each breath. Taking long deep breaths may help reverse or decrease the chance of developing breathing  (pulmonary) problems (especially infection) following:  A long period of time when you are unable to move or be active. BEFORE THE PROCEDURE   If the spirometer includes an indicator to show your best effort, your nurse or respiratory therapist will set it to a desired goal.  If possible, sit up straight or lean slightly forward. Try not to slouch.  Hold the incentive spirometer in an upright position. INSTRUCTIONS FOR USE  1. Sit on the edge of your bed if possible, or sit up as far as you can in bed or on a chair. 2. Hold the incentive spirometer in an upright position. 3. Breathe out normally. 4. Place the mouthpiece in your mouth and seal your lips tightly around it. 5. Breathe in slowly and as deeply as possible, raising the piston or the ball toward the top of the column. 6. Hold your breath for 3-5 seconds or for as long as possible. Allow the piston or ball to fall to the bottom of the column. 7. Remove the mouthpiece from your mouth and breathe out normally. 8. Rest for a few seconds and repeat Steps 1 through 7 at least 10 times every 1-2 hours when you are awake. Take your time and take a few normal breaths between deep breaths. 9. The spirometer may include an indicator to show your best effort. Use the indicator as a goal to work toward during each repetition. 10. After each set of 10 deep breaths, practice coughing to be sure your lungs are clear. If you have an incision (the cut made at the time of surgery), support your incision when coughing by placing a pillow or rolled up towels firmly against it. Once  you are able to get out of bed, walk around indoors and cough well. You may stop using the incentive spirometer when instructed by your caregiver.  RISKS AND COMPLICATIONS  Take your time so you do not get dizzy or light-headed.  If you are in pain, you may need to take or ask for pain medication before doing incentive spirometry. It is harder to take a deep breath if you  are having pain. AFTER USE  Rest and breathe slowly and easily.  It can be helpful to keep track of a log of your progress. Your caregiver can provide you with a simple table to help with this. If you are using the spirometer at home, follow these instructions: [place] IF:   You are having difficultly using the spirometer.  You have trouble using the spirometer as often as instructed.  Your pain medication is not giving enough relief while using the spirometer.  You develop fever of 100.5 F (38.1 C) or higher. SEEK IMMEDIATE MEDICAL CARE IF:   You cough up bloody sputum that had not been present before.  You develop fever of 102 F (38.9 C) or greater.  You develop worsening pain at or near the incision site. MAKE SURE YOU:   Understand these instructions.  Will watch your condition.  Will get help right away if you are not doing well or get worse. Document Released: 03/04/2007 Document Revised: 01/14/2012 Document Reviewed: 05/05/2007 [facility] Patient Information 2014 [place], [place].   ________________________________________________________________________

## 2018-10-22 NOTE — Progress Notes (Signed)
Echo 09-23-18 Epic [facility] CARDIAC CLEARANCE IN NOTES)   EKG/ LOV CARDIOLOGY Purcell Nails , NP  09-17-18 Epic

## 2018-10-27 ENCOUNTER — Encounter (HOSPITAL_COMMUNITY)
Admission: RE | Admit: 2018-10-27 | Discharge: 2018-10-27 | Disposition: A | Discharge status: Home / Self Care | Payer: Medicare Other | Source: Ambulatory Visit | Attending: Orthopedic Surgery | Admitting: Orthopedic Surgery

## 2018-10-27 ENCOUNTER — Encounter (HOSPITAL_COMMUNITY): Payer: Self-pay

## 2018-10-27 ENCOUNTER — Other Ambulatory Visit: Payer: Self-pay

## 2018-10-27 DIAGNOSIS — Z01812 Encounter for preprocedural laboratory examination: Secondary | ICD-10-CM | POA: Insufficient documentation

## 2018-10-27 HISTORY — DX: Atherosclerotic heart disease of native coronary artery without angina pectoris: I25.10

## 2018-10-27 HISTORY — DX: Nonrheumatic aortic (valve) stenosis: I35.0

## 2018-10-27 HISTORY — DX: Unspecified right bundle-branch block: I45.10

## 2018-10-27 HISTORY — DX: Prediabetes: R73.03

## 2018-10-27 HISTORY — DX: Other specified postprocedural states: Z98.890

## 2018-10-27 HISTORY — DX: Nausea with vomiting, unspecified: R11.2

## 2018-10-27 LAB — CBC
HEMATOCRIT: 41.6 % (ref 36.0–46.0)
HEMOGLOBIN: 13.2 g/dL (ref 12.0–15.0)
MCH: 30.1 pg (ref 26.0–34.0)
MCHC: 31.7 g/dL (ref 30.0–36.0)
MCV: 95 fL (ref 80.0–100.0)
Platelets: 218 10*3/uL (ref 150–400)
RBC: 4.38 MIL/uL (ref 3.87–5.11)
RDW: 13.1 % (ref 11.5–15.5)
WBC: 7.7 10*3/uL (ref 4.0–10.5)
nRBC: 0 % (ref 0.0–0.2)

## 2018-10-27 LAB — HEPATIC FUNCTION PANEL
ALK PHOS: 43 U/L (ref 38–126)
ALT: 13 U/L (ref 0–44)
AST: 14 U/L — ABNORMAL LOW (ref 15–41)
Albumin: 4.3 g/dL (ref 3.5–5.0)
Bilirubin, Direct: 0.1 mg/dL (ref 0.0–0.2)
Indirect Bilirubin: 0.6 mg/dL (ref 0.3–0.9)
Total Bilirubin: 0.7 mg/dL (ref 0.3–1.2)
Total Protein: 7 g/dL (ref 6.5–8.1)

## 2018-10-27 LAB — BASIC METABOLIC PANEL
Anion gap: 8 (ref 5–15)
BUN: 14 mg/dL (ref 8–23)
CO2: 26 mmol/L (ref 22–32)
Calcium: 8.9 mg/dL (ref 8.9–10.3)
Chloride: 107 mmol/L (ref 98–111)
Creatinine, Ser: 0.57 mg/dL (ref 0.44–1.00)
GFR calc Af Amer: >60 mL/min (ref >60)
GFR calc non Af Amer: >60 mL/min (ref >60)
Glucose, Bld: 111 mg/dL — ABNORMAL HIGH (ref 70–99)
POTASSIUM: 4.2 mmol/L (ref 3.5–5.1)
Sodium: 141 mmol/L (ref 135–145)

## 2018-10-27 LAB — SURGICAL PCR SCREEN
MRSA, PCR: NEGATIVE
Staphylococcus aureus: NEGATIVE

## 2018-11-03 MED ORDER — BUPIVACAINE LIPOSOME 1.3 % IJ SUSP
20.0000 mL | INTRAMUSCULAR | Status: DC
  Filled 2018-11-03: qty 20

## 2018-11-04 ENCOUNTER — Encounter (HOSPITAL_COMMUNITY): Payer: Self-pay | Admitting: *Deleted

## 2018-11-04 ENCOUNTER — Other Ambulatory Visit: Payer: Self-pay

## 2018-11-04 ENCOUNTER — Observation Stay (HOSPITAL_COMMUNITY): Payer: Medicare Other

## 2018-11-04 ENCOUNTER — Inpatient Hospital Stay (HOSPITAL_COMMUNITY): Payer: Medicare Other | Admitting: Certified Registered Nurse Anesthetist

## 2018-11-04 ENCOUNTER — Observation Stay (HOSPITAL_COMMUNITY)
Admission: RE | Admit: 2018-11-04 | Discharge: 2018-11-05 | Disposition: A | Discharge status: Home / Self Care | Payer: Medicare Other | Source: Ambulatory Visit | Attending: Orthopedic Surgery | Admitting: Orthopedic Surgery

## 2018-11-04 ENCOUNTER — Encounter (HOSPITAL_COMMUNITY)
Admission: RE | Disposition: A | Discharge status: Home / Self Care | Payer: Self-pay | Source: Ambulatory Visit | Attending: Orthopedic Surgery

## 2018-11-04 DIAGNOSIS — Z87891 Personal history of nicotine dependence: Secondary | ICD-10-CM | POA: Insufficient documentation

## 2018-11-04 DIAGNOSIS — Z90710 Acquired absence of both cervix and uterus: Secondary | ICD-10-CM | POA: Insufficient documentation

## 2018-11-04 DIAGNOSIS — Z79899 Other long term (current) drug therapy: Secondary | ICD-10-CM | POA: Insufficient documentation

## 2018-11-04 DIAGNOSIS — Z886 Allergy status to analgesic agent status: Secondary | ICD-10-CM | POA: Insufficient documentation

## 2018-11-04 DIAGNOSIS — Z96659 Presence of unspecified artificial knee joint: Secondary | ICD-10-CM

## 2018-11-04 DIAGNOSIS — M5431 Sciatica, right side: Secondary | ICD-10-CM | POA: Insufficient documentation

## 2018-11-04 DIAGNOSIS — M1711 Unilateral primary osteoarthritis, right knee: Secondary | ICD-10-CM | POA: Diagnosis not present

## 2018-11-04 DIAGNOSIS — Z96651 Presence of right artificial knee joint: Secondary | ICD-10-CM | POA: Diagnosis not present

## 2018-11-04 DIAGNOSIS — I350 Nonrheumatic aortic (valve) stenosis: Secondary | ICD-10-CM | POA: Diagnosis not present

## 2018-11-04 DIAGNOSIS — Z7951 Long term (current) use of inhaled steroids: Secondary | ICD-10-CM | POA: Diagnosis not present

## 2018-11-04 DIAGNOSIS — E785 Hyperlipidemia, unspecified: Secondary | ICD-10-CM | POA: Diagnosis not present

## 2018-11-04 DIAGNOSIS — G8929 Other chronic pain: Secondary | ICD-10-CM | POA: Diagnosis not present

## 2018-11-04 DIAGNOSIS — M48061 Spinal stenosis, lumbar region without neurogenic claudication: Secondary | ICD-10-CM | POA: Diagnosis present

## 2018-11-04 DIAGNOSIS — Z91013 Allergy to seafood: Secondary | ICD-10-CM | POA: Diagnosis not present

## 2018-11-04 DIAGNOSIS — Z841 Family history of disorders of kidney and ureter: Secondary | ICD-10-CM | POA: Insufficient documentation

## 2018-11-04 DIAGNOSIS — F411 Generalized anxiety disorder: Secondary | ICD-10-CM | POA: Diagnosis not present

## 2018-11-04 DIAGNOSIS — M5432 Sciatica, left side: Secondary | ICD-10-CM | POA: Diagnosis present

## 2018-11-04 DIAGNOSIS — Z7902 Long term (current) use of antithrombotics/antiplatelets: Secondary | ICD-10-CM | POA: Insufficient documentation

## 2018-11-04 DIAGNOSIS — G8918 Other acute postprocedural pain: Secondary | ICD-10-CM | POA: Diagnosis not present

## 2018-11-04 DIAGNOSIS — J449 Chronic obstructive pulmonary disease, unspecified: Secondary | ICD-10-CM | POA: Diagnosis not present

## 2018-11-04 DIAGNOSIS — R7303 Prediabetes: Secondary | ICD-10-CM | POA: Diagnosis not present

## 2018-11-04 DIAGNOSIS — Z833 Family history of diabetes mellitus: Secondary | ICD-10-CM | POA: Insufficient documentation

## 2018-11-04 DIAGNOSIS — K760 Fatty (change of) liver, not elsewhere classified: Secondary | ICD-10-CM | POA: Insufficient documentation

## 2018-11-04 DIAGNOSIS — I25119 Atherosclerotic heart disease of native coronary artery with unspecified angina pectoris: Secondary | ICD-10-CM | POA: Insufficient documentation

## 2018-11-04 DIAGNOSIS — Z880 Allergy status to penicillin: Secondary | ICD-10-CM | POA: Insufficient documentation

## 2018-11-04 DIAGNOSIS — Z888 Allergy status to other drugs, medicaments and biological substances status: Secondary | ICD-10-CM | POA: Diagnosis not present

## 2018-11-04 DIAGNOSIS — I2583 Coronary atherosclerosis due to lipid rich plaque: Secondary | ICD-10-CM

## 2018-11-04 DIAGNOSIS — I2510 Atherosclerotic heart disease of native coronary artery without angina pectoris: Secondary | ICD-10-CM | POA: Diagnosis not present

## 2018-11-04 DIAGNOSIS — I4510 Unspecified right bundle-branch block: Secondary | ICD-10-CM | POA: Insufficient documentation

## 2018-11-04 DIAGNOSIS — M1990 Unspecified osteoarthritis, unspecified site: Secondary | ICD-10-CM | POA: Insufficient documentation

## 2018-11-04 DIAGNOSIS — M545 Low back pain: Secondary | ICD-10-CM | POA: Diagnosis not present

## 2018-11-04 DIAGNOSIS — Z803 Family history of malignant neoplasm of breast: Secondary | ICD-10-CM | POA: Insufficient documentation

## 2018-11-04 DIAGNOSIS — Z471 Aftercare following joint replacement surgery: Secondary | ICD-10-CM | POA: Diagnosis not present

## 2018-11-04 DIAGNOSIS — K219 Gastro-esophageal reflux disease without esophagitis: Secondary | ICD-10-CM | POA: Diagnosis not present

## 2018-11-04 DIAGNOSIS — J438 Other emphysema: Secondary | ICD-10-CM

## 2018-11-04 DIAGNOSIS — Z809 Family history of malignant neoplasm, unspecified: Secondary | ICD-10-CM | POA: Insufficient documentation

## 2018-11-04 DIAGNOSIS — Z8249 Family history of ischemic heart disease and other diseases of the circulatory system: Secondary | ICD-10-CM | POA: Insufficient documentation

## 2018-11-04 DIAGNOSIS — Z800 Family history of malignant neoplasm of digestive organs: Secondary | ICD-10-CM | POA: Insufficient documentation

## 2018-11-04 DIAGNOSIS — Z87440 Personal history of urinary (tract) infections: Secondary | ICD-10-CM | POA: Insufficient documentation

## 2018-11-04 DIAGNOSIS — Z882 Allergy status to sulfonamides status: Secondary | ICD-10-CM | POA: Insufficient documentation

## 2018-11-04 HISTORY — PX: PARTIAL KNEE ARTHROPLASTY: SHX2174

## 2018-11-04 SURGERY — ARTHROPLASTY, KNEE, UNICOMPARTMENTAL
Anesthesia: General | Site: Knee | Laterality: Right

## 2018-11-04 MED ORDER — HYDROMORPHONE HCL 1 MG/ML IJ SOLN
0.5000 mg | INTRAMUSCULAR | Status: DC | PRN
  Administered 2018-11-04: 1 mg via INTRAVENOUS
  Filled 2018-11-04: qty 1

## 2018-11-04 MED ORDER — STERILE WATER FOR INJECTION IJ SOLN: INTRAMUSCULAR | Status: DC | PRN

## 2018-11-04 MED ORDER — TRANEXAMIC ACID-NACL 1000-0.7 MG/100ML-% IV SOLN
1000.0000 mg | INTRAVENOUS | Status: AC
  Administered 2018-11-04: 1000 mg via INTRAVENOUS
  Filled 2018-11-04: qty 100

## 2018-11-04 MED ORDER — SODIUM CHLORIDE (PF) 0.9 % IJ SOLN
INTRAMUSCULAR | Status: AC
  Filled 2018-11-04: qty 50

## 2018-11-04 MED ORDER — OXYCODONE HCL 5 MG PO TABS: 5.0000 mg | ORAL_TABLET | ORAL | 0 refills | Status: AC | PRN

## 2018-11-04 MED ORDER — ACETAMINOPHEN 325 MG PO TABS: 325.0000 mg | ORAL_TABLET | Freq: Four times a day (QID) | ORAL | Status: DC | PRN

## 2018-11-04 MED ORDER — LORAZEPAM 1 MG PO TABS
1.0000 mg | ORAL_TABLET | Freq: Two times a day (BID) | ORAL | Status: DC
  Administered 2018-11-04 – 2018-11-05 (×2): 1 mg via ORAL
  Filled 2018-11-04 (×2): qty 1

## 2018-11-04 MED ORDER — 0.9 % SODIUM CHLORIDE (POUR BTL) OPTIME
TOPICAL | Status: DC | PRN
  Administered 2018-11-04: 1000 mL

## 2018-11-04 MED ORDER — METOCLOPRAMIDE HCL 5 MG PO TABS: 5.0000 mg | ORAL_TABLET | Freq: Three times a day (TID) | ORAL | Status: DC | PRN

## 2018-11-04 MED ORDER — LIDOCAINE 2% (20 MG/ML) 5 ML SYRINGE
INTRAMUSCULAR | Status: DC | PRN
  Administered 2018-11-04: 60 mg via INTRAVENOUS

## 2018-11-04 MED ORDER — OXYCODONE HCL 5 MG/5ML PO SOLN
5.0000 mg | Freq: Once | ORAL | Status: DC | PRN
  Filled 2018-11-04: qty 5

## 2018-11-04 MED ORDER — SODIUM CHLORIDE 0.9 % IR SOLN
Status: DC | PRN
  Administered 2018-11-04: 1000 mL

## 2018-11-04 MED ORDER — CHLORHEXIDINE GLUCONATE 4 % EX LIQD: 60.0000 mL | Freq: Once | CUTANEOUS | Status: DC

## 2018-11-04 MED ORDER — METHOCARBAMOL 500 MG PO TABS
500.0000 mg | ORAL_TABLET | Freq: Four times a day (QID) | ORAL | Status: DC | PRN
  Administered 2018-11-05: 500 mg via ORAL
  Filled 2018-11-04: qty 1

## 2018-11-04 MED ORDER — ONDANSETRON HCL 4 MG PO TABS: 4.0000 mg | ORAL_TABLET | Freq: Three times a day (TID) | ORAL | 0 refills | Status: DC | PRN

## 2018-11-04 MED ORDER — FENTANYL CITRATE (PF) 100 MCG/2ML IJ SOLN
INTRAMUSCULAR | Status: AC
  Filled 2018-11-04: qty 2

## 2018-11-04 MED ORDER — DEXAMETHASONE SODIUM PHOSPHATE 10 MG/ML IJ SOLN
10.0000 mg | Freq: Once | INTRAMUSCULAR | Status: AC
  Administered 2018-11-05: 10 mg via INTRAVENOUS
  Filled 2018-11-04: qty 1

## 2018-11-04 MED ORDER — PROPOFOL 10 MG/ML IV BOLUS
INTRAVENOUS | Status: AC
  Filled 2018-11-04: qty 60

## 2018-11-04 MED ORDER — ONDANSETRON HCL 4 MG/2ML IJ SOLN
INTRAMUSCULAR | Status: DC | PRN
  Administered 2018-11-04: 4 mg via INTRAVENOUS

## 2018-11-04 MED ORDER — PHENOL 1.4 % MT LIQD: 1.0000 | OROMUCOSAL | Status: DC | PRN

## 2018-11-04 MED ORDER — DOCUSATE SODIUM 100 MG PO CAPS
100.0000 mg | ORAL_CAPSULE | Freq: Two times a day (BID) | ORAL | Status: DC
  Administered 2018-11-04 – 2018-11-05 (×2): 100 mg via ORAL
  Filled 2018-11-04 (×2): qty 1

## 2018-11-04 MED ORDER — METOCLOPRAMIDE HCL 5 MG/ML IJ SOLN
5.0000 mg | Freq: Three times a day (TID) | INTRAMUSCULAR | Status: DC | PRN
  Administered 2018-11-04: 10 mg via INTRAVENOUS
  Filled 2018-11-04: qty 2

## 2018-11-04 MED ORDER — POVIDONE-IODINE 10 % EX SWAB
2.0000 "application " | Freq: Once | CUTANEOUS | Status: AC
  Administered 2018-11-04: 2 via TOPICAL

## 2018-11-04 MED ORDER — ROPIVACAINE HCL 5 MG/ML IJ SOLN
INTRAMUSCULAR | Status: DC | PRN
  Administered 2018-11-04: 20 mL via PERINEURAL

## 2018-11-04 MED ORDER — MAGNESIUM CITRATE PO SOLN: 1.0000 | Freq: Once | ORAL | Status: DC | PRN

## 2018-11-04 MED ORDER — LACTATED RINGERS IV SOLN: INTRAVENOUS | Status: DC

## 2018-11-04 MED ORDER — MENTHOL 3 MG MT LOZG: 1.0000 | LOZENGE | OROMUCOSAL | Status: DC | PRN

## 2018-11-04 MED ORDER — ONDANSETRON HCL 4 MG/2ML IJ SOLN
4.0000 mg | Freq: Four times a day (QID) | INTRAMUSCULAR | Status: DC | PRN
  Administered 2018-11-04: 4 mg via INTRAVENOUS
  Filled 2018-11-04: qty 2

## 2018-11-04 MED ORDER — ACETAMINOPHEN 500 MG PO TABS: 1000.0000 mg | ORAL_TABLET | Freq: Three times a day (TID) | ORAL | 0 refills | Status: AC

## 2018-11-04 MED ORDER — POLYETHYLENE GLYCOL 3350 17 G PO PACK: 17.0000 g | PACK | Freq: Every day | ORAL | Status: DC | PRN

## 2018-11-04 MED ORDER — CLINDAMYCIN PHOSPHATE 900 MG/50ML IV SOLN
900.0000 mg | INTRAVENOUS | Status: AC
  Administered 2018-11-04: 900 mg via INTRAVENOUS
  Filled 2018-11-04: qty 50

## 2018-11-04 MED ORDER — PANTOPRAZOLE SODIUM 40 MG PO TBEC
40.0000 mg | DELAYED_RELEASE_TABLET | Freq: Every day | ORAL | Status: DC
  Administered 2018-11-05: 40 mg via ORAL
  Filled 2018-11-04: qty 1

## 2018-11-04 MED ORDER — OXYCODONE HCL 5 MG PO TABS
5.0000 mg | ORAL_TABLET | ORAL | Status: DC | PRN
  Administered 2018-11-04 – 2018-11-05 (×4): 5 mg via ORAL
  Filled 2018-11-04 (×4): qty 1

## 2018-11-04 MED ORDER — MIDAZOLAM HCL 5 MG/5ML IJ SOLN
INTRAMUSCULAR | Status: DC | PRN
  Administered 2018-11-04: 2 mg via INTRAVENOUS

## 2018-11-04 MED ORDER — STERILE WATER FOR IRRIGATION IR SOLN
Status: DC | PRN
  Administered 2018-11-04: 1000 mL

## 2018-11-04 MED ORDER — ROSUVASTATIN CALCIUM 20 MG PO TABS
40.0000 mg | ORAL_TABLET | Freq: Every day | ORAL | Status: DC
  Administered 2018-11-05: 40 mg via ORAL
  Filled 2018-11-04: qty 2

## 2018-11-04 MED ORDER — NITROGLYCERIN 0.4 MG SL SUBL: 0.4000 mg | SUBLINGUAL_TABLET | SUBLINGUAL | Status: DC | PRN

## 2018-11-04 MED ORDER — CLOPIDOGREL BISULFATE 75 MG PO TABS
75.0000 mg | ORAL_TABLET | Freq: Every day | ORAL | Status: DC
  Administered 2018-11-05: 75 mg via ORAL
  Filled 2018-11-04: qty 1

## 2018-11-04 MED ORDER — SORBITOL 70 % SOLN
30.0000 mL | Freq: Every day | Status: DC | PRN
  Filled 2018-11-04: qty 30

## 2018-11-04 MED ORDER — ONDANSETRON HCL 4 MG PO TABS
4.0000 mg | ORAL_TABLET | Freq: Four times a day (QID) | ORAL | Status: DC | PRN
  Administered 2018-11-05: 4 mg via ORAL
  Filled 2018-11-04: qty 1

## 2018-11-04 MED ORDER — LACTATED RINGERS IV SOLN
INTRAVENOUS | Status: DC
  Administered 2018-11-04 (×2): via INTRAVENOUS

## 2018-11-04 MED ORDER — GABAPENTIN 300 MG PO CAPS
300.0000 mg | ORAL_CAPSULE | Freq: Once | ORAL | Status: AC
  Administered 2018-11-04: 300 mg via ORAL
  Filled 2018-11-04: qty 1

## 2018-11-04 MED ORDER — SODIUM CHLORIDE (PF) 0.9 % IJ SOLN
INTRAMUSCULAR | Status: DC | PRN
  Administered 2018-11-04: 30 mL

## 2018-11-04 MED ORDER — PROPOFOL 500 MG/50ML IV EMUL: INTRAVENOUS | Status: DC | PRN

## 2018-11-04 MED ORDER — PROPOFOL 500 MG/50ML IV EMUL
INTRAVENOUS | Status: DC | PRN
  Administered 2018-11-04: 160 mg via INTRAVENOUS

## 2018-11-04 MED ORDER — FENOFIBRATE 160 MG PO TABS
160.0000 mg | ORAL_TABLET | Freq: Every day | ORAL | Status: DC
  Administered 2018-11-05: 160 mg via ORAL
  Filled 2018-11-04: qty 1

## 2018-11-04 MED ORDER — SODIUM CHLORIDE 0.9 % IV SOLN
INTRAVENOUS | Status: DC | PRN
  Administered 2018-11-04: 20 mL

## 2018-11-04 MED ORDER — VANCOMYCIN HCL IN DEXTROSE 1-5 GM/200ML-% IV SOLN
1000.0000 mg | Freq: Two times a day (BID) | INTRAVENOUS | Status: AC
  Administered 2018-11-04: 1000 mg via INTRAVENOUS
  Filled 2018-11-04: qty 200

## 2018-11-04 MED ORDER — ALBUTEROL SULFATE (2.5 MG/3ML) 0.083% IN NEBU: 3.0000 mL | INHALATION_SOLUTION | RESPIRATORY_TRACT | Status: DC | PRN

## 2018-11-04 MED ORDER — DIPHENHYDRAMINE HCL 12.5 MG/5ML PO ELIX: 12.5000 mg | ORAL_SOLUTION | ORAL | Status: DC | PRN

## 2018-11-04 MED ORDER — TRAMADOL HCL 50 MG PO TABS
50.0000 mg | ORAL_TABLET | Freq: Four times a day (QID) | ORAL | Status: DC
  Administered 2018-11-04 – 2018-11-05 (×5): 50 mg via ORAL
  Filled 2018-11-04 (×5): qty 1

## 2018-11-04 MED ORDER — FENTANYL CITRATE (PF) 100 MCG/2ML IJ SOLN
INTRAMUSCULAR | Status: DC | PRN
  Administered 2018-11-04 (×2): 100 ug via INTRAVENOUS
  Administered 2018-11-04 (×2): 50 ug via INTRAVENOUS

## 2018-11-04 MED ORDER — EPHEDRINE SULFATE 50 MG/ML IJ SOLN
INTRAMUSCULAR | Status: DC | PRN
  Administered 2018-11-04: 10 mg via INTRAVENOUS

## 2018-11-04 MED ORDER — FENTANYL CITRATE (PF) 100 MCG/2ML IJ SOLN: 25.0000 ug | INTRAMUSCULAR | Status: DC | PRN

## 2018-11-04 MED ORDER — MIDAZOLAM HCL 2 MG/2ML IJ SOLN
INTRAMUSCULAR | Status: AC
  Filled 2018-11-04: qty 2

## 2018-11-04 MED ORDER — SODIUM CHLORIDE (PF) 0.9 % IJ SOLN
INTRAMUSCULAR | Status: AC
  Filled 2018-11-04: qty 30

## 2018-11-04 MED ORDER — OXYCODONE HCL 5 MG PO TABS: 5.0000 mg | ORAL_TABLET | Freq: Once | ORAL | Status: DC | PRN

## 2018-11-04 MED ORDER — ACETAMINOPHEN 500 MG PO TABS
1000.0000 mg | ORAL_TABLET | Freq: Three times a day (TID) | ORAL | Status: DC
  Administered 2018-11-04 – 2018-11-05 (×4): 1000 mg via ORAL
  Filled 2018-11-04 (×4): qty 2

## 2018-11-04 MED ORDER — LABETALOL HCL 5 MG/ML IV SOLN
10.0000 mg | INTRAVENOUS | Status: DC | PRN
  Filled 2018-11-04: qty 4

## 2018-11-04 MED ORDER — GABAPENTIN 300 MG PO CAPS
300.0000 mg | ORAL_CAPSULE | Freq: Three times a day (TID) | ORAL | Status: DC
  Administered 2018-11-04 – 2018-11-05 (×3): 300 mg via ORAL
  Filled 2018-11-04 (×3): qty 1

## 2018-11-04 MED ORDER — ASPIRIN EC 81 MG PO TBEC: 81.0000 mg | DELAYED_RELEASE_TABLET | Freq: Two times a day (BID) | ORAL | 0 refills | Status: DC

## 2018-11-04 MED ORDER — METHOCARBAMOL 500 MG IVPB - SIMPLE MED
500.0000 mg | Freq: Four times a day (QID) | INTRAVENOUS | Status: DC | PRN
  Administered 2018-11-04: 500 mg via INTRAVENOUS
  Filled 2018-11-04: qty 500
  Filled 2018-11-04: qty 50

## 2018-11-04 MED ORDER — ACETAMINOPHEN 500 MG PO TABS
1000.0000 mg | ORAL_TABLET | Freq: Once | ORAL | Status: AC
  Administered 2018-11-04: 1000 mg via ORAL
  Filled 2018-11-04: qty 2

## 2018-11-04 MED ORDER — DEXAMETHASONE SODIUM PHOSPHATE 10 MG/ML IJ SOLN
INTRAMUSCULAR | Status: DC | PRN
  Administered 2018-11-04: 5 mg via INTRAVENOUS

## 2018-11-04 MED ORDER — GABAPENTIN 300 MG PO CAPS: 300.0000 mg | ORAL_CAPSULE | Freq: Three times a day (TID) | ORAL | 0 refills | Status: DC

## 2018-11-04 MED ORDER — ASPIRIN 81 MG PO CHEW
81.0000 mg | CHEWABLE_TABLET | Freq: Two times a day (BID) | ORAL | Status: DC
  Administered 2018-11-04 – 2018-11-05 (×2): 81 mg via ORAL
  Filled 2018-11-04 (×2): qty 1

## 2018-11-04 MED ORDER — DOCUSATE SODIUM 100 MG PO CAPS: 100.0000 mg | ORAL_CAPSULE | Freq: Two times a day (BID) | ORAL | 0 refills | Status: DC

## 2018-11-04 MED ORDER — LACTATED RINGERS IV SOLN
INTRAVENOUS | Status: DC
  Administered 2018-11-04 (×2): via INTRAVENOUS

## 2018-11-04 MED ORDER — ONDANSETRON HCL 4 MG/2ML IJ SOLN: 4.0000 mg | Freq: Four times a day (QID) | INTRAMUSCULAR | Status: DC | PRN

## 2018-11-04 SURGICAL SUPPLY — 52 items
BEARING MENISCAL TIBIAL 4 MD R (Orthopedic Implant) ×2 IMPLANT
BLADE SURG 15 STRL LF C SS BP (BLADE) ×1 IMPLANT
BLADE SURG 15 STRL SS (BLADE) ×1
BNDG ELASTIC 6X10 VLCR STRL LF (GAUZE/BANDAGES/DRESSINGS) ×2 IMPLANT
BOWL SMART MIX CTS (DISPOSABLE) ×2 IMPLANT
CEMENT BONE R 1X40 (Cement) ×2 IMPLANT
CHLORAPREP W/TINT 26ML (MISCELLANEOUS) ×4 IMPLANT
COMPONENT TIBIAL OXFRD MEDL RT (Orthopedic Implant) ×1 IMPLANT
COVER SURGICAL LIGHT HANDLE (MISCELLANEOUS) ×2 IMPLANT
COVER WAND RF STERILE (DRAPES) ×2 IMPLANT
CUFF TOURN SGL QUICK 34 (TOURNIQUET CUFF) ×1
CUFF TRNQT CYL 34X4X40X1 (TOURNIQUET CUFF) ×1 IMPLANT
DRAPE ARTHROSCOPY W/POUCH 114 (DRAPES) ×2 IMPLANT
DRAPE U-SHAPE 47X51 STRL (DRAPES) ×2 IMPLANT
DRSG MEPILEX BORDER 4X12 (GAUZE/BANDAGES/DRESSINGS) ×2 IMPLANT
DRSG MEPILEX BORDER 4X8 (GAUZE/BANDAGES/DRESSINGS) IMPLANT
ELECT REM PT RETURN 15FT ADLT (MISCELLANEOUS) ×2 IMPLANT
FORCEPS GRASP 3 PRONG 3.2FR (CUTTING FORCEPS) ×2 IMPLANT
GLOVE BIO SURGEON STRL SZ7.5 (GLOVE) ×4 IMPLANT
GLOVE BIOGEL PI IND STRL 7.5 (GLOVE) ×2 IMPLANT
GLOVE BIOGEL PI IND STRL 8 (GLOVE) ×3 IMPLANT
GLOVE BIOGEL PI INDICATOR 7.5 (GLOVE) ×2
GLOVE BIOGEL PI INDICATOR 8 (GLOVE) ×3
GLOVE SURG SS PI 6.5 STRL IVOR (GLOVE) ×2 IMPLANT
GLOVE SURG SS PI 7.0 STRL IVOR (GLOVE) ×4 IMPLANT
GOWN STRL REUS W/ TWL LRG LVL3 (GOWN DISPOSABLE) ×1 IMPLANT
GOWN STRL REUS W/ TWL XL LVL3 (GOWN DISPOSABLE) ×2 IMPLANT
GOWN STRL REUS W/TWL LRG LVL3 (GOWN DISPOSABLE) ×1
GOWN STRL REUS W/TWL XL LVL3 (GOWN DISPOSABLE) ×2
HANDPIECE INTERPULSE COAX TIP (DISPOSABLE) ×1
IMMOBILIZER KNEE 20 (SOFTGOODS) ×2
IMMOBILIZER KNEE 20 THIGH 36 (SOFTGOODS) ×1 IMPLANT
MANIFOLD NEPTUNE II (INSTRUMENTS) ×2 IMPLANT
NS IRRIG 1000ML POUR BTL (IV SOLUTION) ×2 IMPLANT
PACK BLADE SAW RECIP 70 3 PT (BLADE) ×2 IMPLANT
PACK ICE MAXI GEL EZY WRAP (MISCELLANEOUS) ×2 IMPLANT
PACK TOTAL KNEE CUSTOM (KITS) ×2 IMPLANT
PEG TWIN FEM CEMENTED MED (Knees) ×2 IMPLANT
PROTECTOR NERVE ULNAR (MISCELLANEOUS) ×2 IMPLANT
SET HNDPC FAN SPRY TIP SCT (DISPOSABLE) ×1 IMPLANT
STRIP CLOSURE SKIN 1/2X4 (GAUZE/BANDAGES/DRESSINGS) ×2 IMPLANT
SUCTION FRAZIER HANDLE 10FR (MISCELLANEOUS) ×1
SUCTION TUBE FRAZIER 10FR DISP (MISCELLANEOUS) ×1 IMPLANT
SUT MNCRL AB 4-0 PS2 18 (SUTURE) ×2 IMPLANT
SUT VIC AB 0 CT1 36 (SUTURE) ×2 IMPLANT
SUT VIC AB 1 CT1 36 (SUTURE) ×4 IMPLANT
SUT VIC AB 2-0 CT1 27 (SUTURE) ×2
SUT VIC AB 2-0 CT1 TAPERPNT 27 (SUTURE) ×2 IMPLANT
TIBIAL OXFORD MEDIAL RT (Orthopedic Implant) ×2 IMPLANT
TRAY FOLEY MTR SLVR 16FR STAT (SET/KITS/TRAYS/PACK) ×2 IMPLANT
WRAP KNEE MAXI GEL POST OP (GAUZE/BANDAGES/DRESSINGS) ×2 IMPLANT
YANKAUER SUCT BULB TIP 10FT TU (MISCELLANEOUS) ×2 IMPLANT

## 2018-11-04 NOTE — Evaluation (Signed)
Physical Therapy Evaluation Patient Details Name: Brenda [name] MRN: [id number] DOB: 1956-06-30 Today's Date: 11/04/2018   History of Present Illness  Pt is a 62 year old female s/p R unicompartmenal knee  Clinical Impression  Patient is s/p above surgery resulting in functional limitations due to the deficits listed below (see PT Problem List).  Pt reports difficulty with nausea earlier however received meds and now feels able to mobilize.  Pt assisted with ambulating short distance POD #0 and plans to d/c home (hopefully tomorrow per pt). Patient will benefit from skilled PT to increase their independence and safety with mobility to allow discharge to the venue listed below.       Follow Up Recommendations Follow surgeon's recommendation for DC plan and follow-up therapies(plan for OP PT)    Equipment Recommendations  None recommended by PT    Recommendations for Other Services       Precautions / Restrictions Precautions Precautions: Fall;Knee Required Braces or Orthoses: Knee Immobilizer - Right Restrictions Other Position/Activity Restrictions: WBAT      Mobility  Bed Mobility Overal bed mobility: Needs Assistance Bed Mobility: Supine to Sit     Supine to sit: Min guard;HOB elevated     General bed mobility comments: verbal cues for technique  Transfers Overall transfer level: Needs assistance Equipment used: Rolling walker (2 wheeled) Transfers: Sit to/from Stand Sit to Stand: Min guard         General transfer comment: verbal cues for UE and LE positioning, min/guard for safety  Ambulation/Gait Ambulation/Gait assistance: Min guard Gait Distance (Feet): 60 Feet Assistive device: Rolling walker (2 wheeled) Gait Pattern/deviations: Step-to pattern;Decreased stance time - right;Antalgic     General Gait Details: verbal cues for sequence, RW positioning, posture  Stairs            Wheelchair Mobility    Modified Rankin (Stroke Patients  Only)       Balance                                             Pertinent Vitals/Pain Pain Assessment: 0-10 Pain Score: 3  Pain Location: R knee Pain Descriptors / Indicators: Aching;Sore Pain Intervention(s): Limited activity within patient's tolerance;Premedicated before session;Monitored during session;Repositioned;Ice applied    Home Living Family/patient expects to be discharged to:: Private residence Living Arrangements: Spouse/significant other   Type of Home: House Home Access: Stairs to enter Entrance Stairs-Rails: Right Entrance Stairs-Number of Steps: 3 Home Layout: One level Home Equipment: Environmental consultant - 2 wheels      Prior Function Level of Independence: Independent               Hand Dominance        Extremity/Trunk Assessment        Lower Extremity Assessment Lower Extremity Assessment: RLE deficits/detail RLE Deficits / Details: able to perform SLR, able to perform ankle pumps, ROM TBA       Communication   Communication: No difficulties  Cognition Arousal/Alertness: Awake/alert Behavior During Therapy: WFL for tasks assessed/performed Overall Cognitive Status: Within Functional Limits for tasks assessed                                        General Comments      Exercises     Assessment/Plan  PT Assessment Patient needs continued PT services  PT Problem List Decreased strength;Decreased mobility;Decreased range of motion;Decreased knowledge of use of DME;Pain;Decreased knowledge of precautions       PT Treatment Interventions Stair training;Gait training;Therapeutic activities;Functional mobility training;DME instruction;Balance training;Patient/family education;Therapeutic exercise    PT Goals (Current goals can be found in the Care Plan section)  Acute Rehab PT Goals PT Goal Formulation: With patient Time For Goal Achievement: 11/07/18 Potential to Achieve Goals: Good    Frequency  7X/week   Barriers to discharge        Co-evaluation               AM-PAC PT "6 Clicks" Mobility  Outcome Measure Help needed turning from your back to your side while in a flat bed without using bedrails?: None Help needed moving from lying on your back to sitting on the side of a flat bed without using bedrails?: A Little Help needed moving to and from a bed to a chair (including a wheelchair)?: A Little Help needed standing up from a chair using your arms (e.g., wheelchair or bedside chair)?: A Little Help needed to walk in hospital room?: A Little Help needed climbing 3-5 steps with a railing? : A Little 6 Click Score: 19    End of Session Equipment Utilized During Treatment: Gait belt;Right knee immobilizer Activity Tolerance: Patient tolerated treatment well Patient left: in chair;with call bell/phone within reach;with chair alarm set   PT Visit Diagnosis: Difficulty in walking, not elsewhere classified (R26.2)    Time: 3159-4585 PT Time Calculation (min) (ACUTE ONLY): 14 min   Charges:   PT Evaluation $PT Eval Low Complexity: [place], PT, DPT Acute Rehabilitation Services Office: [phone] Pager: [phone]  Trena Platt 11/04/2018, 4:37 PM

## 2018-11-04 NOTE — Transfer of Care (Signed)
Immediate Anesthesia Transfer of Care Note  Patient: Brenda [name]  Procedure(s) Performed: UNICOMPARTMENTAL KNEE (Right Knee)  Patient Location: PACU  Anesthesia Type:General  Level of Consciousness: sedated, patient cooperative and responds to stimulation  Airway & Oxygen Therapy: Patient Spontanous Breathing and Patient connected to face mask oxygen  Post-op Assessment: Report given to RN and Post -op Vital signs reviewed and stable  Post vital signs: Reviewed and stable  Last Vitals:  Vitals Value Taken Time  BP 137/84 11/04/2018  9:35 AM  Temp    Pulse 72 11/04/2018  9:37 AM  Resp 10 11/04/2018  9:37 AM  SpO2 96 % 11/04/2018  9:37 AM  Vitals shown include unvalidated device data.  Last Pain:  Vitals:   11/04/18 0610  TempSrc: Oral  PainSc:       Patients Stated Pain Goal: 4 (32/12/24 8250)  Complications: No apparent anesthesia complications

## 2018-11-04 NOTE — Discharge Instructions (Signed)

## 2018-11-04 NOTE — Plan of Care (Signed)
Plan of care 

## 2018-11-04 NOTE — Care Plan (Signed)
Ortho Bundle Case Management Note  Patient Details  Name: Brenda [name] MRN: [id number] Date of Birth: Mar 16, 1956     Met with patient and husband in the office. She will discharge to home with family and go straight to OPPT. This will be arranged at [place] [facility] in [place].                 DME Arranged:    DME Agency:     HH Arranged:    HH Agency:     Additional Comments: Please contact me with any questions of if this plan should need to change.  Ladell Heads,  [facility]  [phone] 11/04/2018, 3:01 PM

## 2018-11-04 NOTE — Anesthesia Procedure Notes (Signed)
Anesthesia Regional Block: Adductor canal block   Pre-Anesthetic Checklist: ,, timeout performed, Correct Patient, Correct Site, Correct Laterality, Correct Procedure, Correct Position, site marked, Risks and benefits discussed,  Surgical consent,  Pre-op evaluation,  At surgeon's request and post-op pain management  Laterality: Right  Prep: chloraprep       Needles:  Injection technique: Single-shot  Needle Type: Echogenic Needle     Needle Length: 9cm  Needle Gauge: 21     Additional Needles:   Procedures:,,,, ultrasound used (permanent image in chart),,,,  Narrative:  Start time: 11/04/2018 7:02 AM End time: 11/04/2018 7:11 AM Injection made incrementally with aspirations every 5 mL.  Performed by: Personally  Anesthesiologist: Albertha Ghee, MD  Additional Notes: Pt tolerated the procedure well.

## 2018-11-04 NOTE — Anesthesia Preprocedure Evaluation (Addendum)
Anesthesia Evaluation  Patient identified by MRN, date of birth, ID band Patient awake    Reviewed: Allergy & Precautions, H&P , NPO status , Patient's Chart, lab work & pertinent test results  History of Anesthesia Complications (+) PONV and history of anesthetic complications  Airway Mallampati: II   Neck ROM: full    Dental   Pulmonary COPD, former smoker,    breath sounds clear to auscultation       Cardiovascular + angina + CAD   Rhythm:regular Rate:Normal     Neuro/Psych PSYCHIATRIC DISORDERS Anxiety  Neuromuscular disease    GI/Hepatic GERD  ,  Endo/Other    Renal/GU      Musculoskeletal  (+) Arthritis , Multiple back surgeries   Abdominal   Peds  Hematology   Anesthesia Other Findings   Reproductive/Obstetrics                            Anesthesia Physical Anesthesia Plan  ASA: III  Anesthesia Plan: General   Post-op Pain Management:  Regional for Post-op pain   Induction: Intravenous  PONV Risk Score and Plan: 3 and Ondansetron, Treatment may vary due to age or medical condition, Midazolam and Dexamethasone  Airway Management Planned: LMA  Additional Equipment:   Intra-op Plan:   Post-operative Plan: Extubation in OR  Informed Consent: I have reviewed the patients History and Physical, chart, labs and discussed the procedure including the risks, benefits and alternatives for the proposed anesthesia with the patient or authorized representative who has indicated his/her understanding and acceptance.     Plan Discussed with: CRNA, Anesthesiologist and Surgeon  Anesthesia Plan Comments: (Pt declined spinal)       Anesthesia Quick Evaluation

## 2018-11-04 NOTE — Op Note (Signed)
11/04/2018  8:42 AM  PATIENT:  Brenda [name]    PRE-OPERATIVE DIAGNOSIS:  OA RIGHT KNEE  POST-OPERATIVE DIAGNOSIS:  Same  PROCEDURE:  UNICOMPARTMENTAL KNEE  SURGEON:  Renette Butters, MD  PHYSICIAN ASSISTANT: Roxan Hockey, PA-C, he was present and scrubbed throughout the case, critical for completion in a timely fashion, and for retraction, instrumentation, and closure.   ANESTHESIA:   General  PREOPERATIVE INDICATIONS:  Brenda [name] is a  62 y.o. female with a diagnosis of OA RIGHT KNEE who failed conservative measures and elected for surgical management.    The risks benefits and alternatives were discussed with the patient preoperatively including but not limited to the risks of infection, bleeding, nerve injury, cardiopulmonary complications, blood clots, the need for revision surgery, among others, and the patient was willing to proceed.  OPERATIVE IMPLANTS: Biomet Oxford mobile bearing medial compartment arthroplasty. Femoral Component: med. Tibial tray: B, Size 4 poly.   OPERATIVE FINDINGS: Endstage grade 4 medial compartment osteoarthritis. No significant changes in the lateral or patellofemoral joint  OPERATIVE PROCEDURE: The patient was brought to the operating room placed in supine position. General anesthesia was administered. IV antibiotics were given. The lower extremity was placed in the legholder and prepped and draped in usual sterile fashion.  Time out was performed.  The leg was elevated and exsanguinated and the tourniquet was inflated. Anteromedial incision was performed, and I took care to preserve the MCL. Parapatellar incision was carried out, and the osteophytes were excised, along with the medial meniscus and a small portion of the fat pad.  The extra medullary tibial cutting jig was applied, using the spoon and the 24mm G-Clamp, and I took care to protect the anterior cruciate ligament insertion and the tibial spine. The medial collateral ligament  was also protected, and I resected my proximal tibia, matching the anatomic slope.   The proximal tibial bony cut was removed in one piece, and I turned my attention to the femur.  The intramedullary femoral rod was placed using the drill, and then using the appropriate reference, I assembled the femoral jig, setting my posterior cutting block. I resected my posterior femur, and then measured my gap.   I then used the mill to match the extension gap to the flexion gap. The gaps were then measured again with the appropriate feeler gauges. Once I had balanced flexion and extension gaps, I then completed the preparation of the femur.  I milled off the anterior aspect of the distal femur to prevent impingement. I also exposed the tibia, and selected the above-named component, and then used the cutting jig to prepare the keel slot on the tibia. I also used the awl to curette out the bone to complete the preparation of the keel. The back wall was intact.  I then placed trial components, and it was found to have excellent motion, and appropriate balance.  I then cemented the components into place, cementing the tibia first, removing all excess cement, and then cementing the femur.  All loose cement was removed.  The real polyethylene insert was applied manually, and the knee was taken through functional range of motion, and found to have excellent stability and restoration of joint motion, with excellent balance.  The wounds were irrigated copiously, and the parapatellar tissue closed with Vicryl, followed by Vicryl for the subcutaneous tissue, with routine closure with Steri-Strips and sterile gauze.  The tourniquet was released, and the patient was awakened and extubated and returned to PACU in  stable and satisfactory condition. There were no complications.  POSTOPERATIVE PLAN: DVT px will consist of SCD's, mobiliation and chemical px, WBAT     Renette Butters, MD

## 2018-11-04 NOTE — Anesthesia Procedure Notes (Signed)
Procedure Name: LMA Insertion Performed by: Shmuel Girgis J, CRNA Pre-anesthesia Checklist: Patient identified, Emergency Drugs available, Suction available, Patient being monitored and Timeout performed Patient Re-evaluated:Patient Re-evaluated prior to induction Oxygen Delivery Method: Circle system utilized Preoxygenation: Pre-oxygenation with 100% oxygen Induction Type: IV induction Ventilation: Mask ventilation without difficulty LMA: LMA inserted LMA Size: 4.0 Number of attempts: 1 Placement Confirmation: positive ETCO2 and breath sounds checked- equal and bilateral Tube secured with: Tape Dental Injury: Teeth and Oropharynx as per pre-operative assessment        

## 2018-11-05 DIAGNOSIS — Z87891 Personal history of nicotine dependence: Secondary | ICD-10-CM | POA: Diagnosis not present

## 2018-11-05 DIAGNOSIS — Z7902 Long term (current) use of antithrombotics/antiplatelets: Secondary | ICD-10-CM | POA: Diagnosis not present

## 2018-11-05 DIAGNOSIS — G8929 Other chronic pain: Secondary | ICD-10-CM | POA: Diagnosis not present

## 2018-11-05 DIAGNOSIS — Z87440 Personal history of urinary (tract) infections: Secondary | ICD-10-CM | POA: Diagnosis not present

## 2018-11-05 DIAGNOSIS — R7303 Prediabetes: Secondary | ICD-10-CM | POA: Diagnosis not present

## 2018-11-05 DIAGNOSIS — E785 Hyperlipidemia, unspecified: Secondary | ICD-10-CM | POA: Diagnosis not present

## 2018-11-05 DIAGNOSIS — Z90710 Acquired absence of both cervix and uterus: Secondary | ICD-10-CM | POA: Diagnosis not present

## 2018-11-05 DIAGNOSIS — Z79899 Other long term (current) drug therapy: Secondary | ICD-10-CM | POA: Diagnosis not present

## 2018-11-05 DIAGNOSIS — Z886 Allergy status to analgesic agent status: Secondary | ICD-10-CM | POA: Diagnosis not present

## 2018-11-05 DIAGNOSIS — M5431 Sciatica, right side: Secondary | ICD-10-CM | POA: Diagnosis not present

## 2018-11-05 DIAGNOSIS — Z880 Allergy status to penicillin: Secondary | ICD-10-CM | POA: Diagnosis not present

## 2018-11-05 DIAGNOSIS — K760 Fatty (change of) liver, not elsewhere classified: Secondary | ICD-10-CM | POA: Diagnosis not present

## 2018-11-05 DIAGNOSIS — M545 Low back pain: Secondary | ICD-10-CM | POA: Diagnosis not present

## 2018-11-05 DIAGNOSIS — Z7951 Long term (current) use of inhaled steroids: Secondary | ICD-10-CM | POA: Diagnosis not present

## 2018-11-05 DIAGNOSIS — M5432 Sciatica, left side: Secondary | ICD-10-CM | POA: Diagnosis not present

## 2018-11-05 DIAGNOSIS — Z888 Allergy status to other drugs, medicaments and biological substances status: Secondary | ICD-10-CM | POA: Diagnosis not present

## 2018-11-05 DIAGNOSIS — F411 Generalized anxiety disorder: Secondary | ICD-10-CM | POA: Diagnosis not present

## 2018-11-05 DIAGNOSIS — I350 Nonrheumatic aortic (valve) stenosis: Secondary | ICD-10-CM | POA: Diagnosis not present

## 2018-11-05 DIAGNOSIS — J449 Chronic obstructive pulmonary disease, unspecified: Secondary | ICD-10-CM | POA: Diagnosis not present

## 2018-11-05 DIAGNOSIS — Z91013 Allergy to seafood: Secondary | ICD-10-CM | POA: Diagnosis not present

## 2018-11-05 DIAGNOSIS — K219 Gastro-esophageal reflux disease without esophagitis: Secondary | ICD-10-CM | POA: Diagnosis not present

## 2018-11-05 DIAGNOSIS — M48061 Spinal stenosis, lumbar region without neurogenic claudication: Secondary | ICD-10-CM | POA: Diagnosis not present

## 2018-11-05 DIAGNOSIS — I4510 Unspecified right bundle-branch block: Secondary | ICD-10-CM | POA: Diagnosis not present

## 2018-11-05 DIAGNOSIS — M1711 Unilateral primary osteoarthritis, right knee: Secondary | ICD-10-CM | POA: Diagnosis not present

## 2018-11-05 NOTE — Progress Notes (Signed)
Subjective: Patient reports pain as moderate.  Tolerating diet.  Urinating.  No CP, SOB.   OOB, good early mobilization.  Objective:   VITALS:   Vitals:   11/04/18 1426 11/04/18 2125 11/05/18 0224 11/05/18 0558  BP: (!) 128/59 123/67 124/63 (!) 143/97  Pulse: 79 73 70 76  Resp: 16 17 17 18   Temp: 98.3 F (36.8 C) 98.4 F (36.9 C) 98.2 F (36.8 C) 98.4 F (36.9 C)  TempSrc: Oral  Oral Oral  SpO2: 94% 95% 96% 95%  Weight:      Height:       CBC Latest Ref Rng & Units 10/27/2018 12/05/2017 05/10/2015  WBC 4.0 - 10.5 K/uL 7.7 10.6(H) 8.3  Hemoglobin 12.0 - 15.0 g/dL 13.2 15.1(H) 12.8  Hematocrit 36.0 - 46.0 % 41.6 45.2 38.1  Platelets 150 - 400 K/uL 218 275.0 203   BMP Latest Ref Rng & Units 10/27/2018 06/05/2018 12/05/2017  Glucose 70 - 99 mg/dL 111(H) 115(H) 106(H)  BUN 8 - 23 mg/dL 14 11 12   Creatinine 0.44 - 1.00 mg/dL 0.57 0.69 0.68  Sodium 135 - 145 mmol/L 141 143 142  Potassium 3.5 - 5.1 mmol/L 4.2 4.5 4.4  Chloride 98 - 111 mmol/L 107 104 103  CO2 22 - 32 mmol/L 26 31 30   Calcium 8.9 - 10.3 mg/dL 8.9 10.0 9.9   Intake/Output      12/31 0701 - 01/01 0700 01/01 0701 - 01/02 0700   P.O. 960    I.V. (mL/kg) 2808.5 (31.6)    IV Piggyback 50    Total Intake(mL/kg) 3818.5 (42.9)    Urine (mL/kg/hr) 3425 (1.6)    Blood 50    Total Output 3475    Net +343.5            Physical Exam: General: NAD.  Upright in bed on arrival.  Calm, conversant. Resp: No increased wob Cardio: regular rate and rhythm ABD soft Neurologically intact MSK Neurovascularly intact Sensation intact distally Intact pulses distally Dorsiflexion/Plantar flexion intact Incision: dressing C/D/I   Assessment: 1 Day Post-Op  S/P Procedure(s) (LRB): UNICOMPARTMENTAL KNEE (Right) by Dr. Ernesta Amble. Percell Miller on 11/04/2018  Principal Problem:   Primary osteoarthritis of right knee Active Problems:   COPD (chronic obstructive pulmonary disease) (HCC)   CAD (coronary artery disease)  Chronic low back pain   Bilateral sciatica   Gastroesophageal reflux disease without esophagitis   Generalized anxiety disorder   Spinal stenosis of lumbar region without neurogenic claudication   Primary osteoarthritis, status post right unicompartmental knee arthroplasty Doing well postop day 1 Some difficulty with postop pain control as she has been on chronic pain medicine previously.  This is improved. Eating, drinking, and voiding Good early mobilization BP has been elevated when in pain.  Plan: Up with therapy Discharge home with home health Incentive Spirometry Elevate and Apply ice CPM, bone foam  Weight Bearing: Weight Bearing as Tolerated (WBAT) RLE Dressings: Maintain Mepilex.   VTE prophylaxis: Aspirin, SCDs, ambulation Dispo: Home today after p.m. therapy session.    Patient's anticipated LOS is less than 2 midnights, meeting these requirements: - Younger than 53 - Lives within 1 hour of care - Has a competent adult at home to recover with post-op recover - NO history of  - Diabetes  - Heart attack  - Stroke  - DVT/VTE  - Cardiac arrhythmia  - Renal failure  - Anemia  - Advanced Liver disease   Prudencio Burly III, PA-C 11/05/2018, 7:38 AM

## 2018-11-05 NOTE — Plan of Care (Signed)
Pt alert and oriented, c/o of pain well controlled with PO pain meds.  Plan to d/c home today per MD order. RN will monitor.

## 2018-11-05 NOTE — Discharge Summary (Signed)
Discharge Summary  Patient ID: Brenda [name] MRN: [id number] DOB/AGE: 01/21/1956 63 y.o.  Admit date: 11/04/2018 Discharge date: 11/05/2018  Admission Diagnoses:  Primary osteoarthritis of right knee  Discharge Diagnoses:  Principal Problem:   Primary osteoarthritis of right knee Active Problems:   COPD (chronic obstructive pulmonary disease) (HCC)   CAD (coronary artery disease)   Chronic low back pain   Bilateral sciatica   Gastroesophageal reflux disease without esophagitis   Generalized anxiety disorder   Spinal stenosis of lumbar region without neurogenic claudication   Past Medical History:  Diagnosis Date  . Aortic valve stenosis    "very mild"  . Atypical chest pain 2007; 2015   cardiolyte neg, echo nl, cath showed mild/nonobstructive LAD disease  . Bilateral primary osteoarthritis of knee 06/30/2012   Right >L.  Plan for medial compartment knee arthroplasty as of 09/2018.  Marland Kitchen CAD (coronary artery disease)   . Chronic LBP    Multiple surgeries  . COPD (chronic obstructive pulmonary disease) ([facility])   . DDD (degenerative disc disease)    spinal stenosis  . Fatty liver   . History of GI bleed    NSAIDS  . Hyperlipidemia    mixed  . Microscopic hematuria    full w/ u unrevealing X 2  . Normal nuclear stress test 11/11 and 06/2014   negative, EF normal  . Palpitations 2006   48H holter neg  . PONV (postoperative nausea and vomiting)    "never threw up but felt sick on my stomach", also believes that she was aware of surgery during her hysterectomy   . Pre-diabetes   . Prediabetes    Highest A1c 6.1% as of 03/2017  . RBBB (right bundle branch block)   . Recurrent UTI   . Tobacco dependence in remission    Quit fall 2015.  Only occasional bronchitis illness (CT shest 2009 nl- for f/u of ? nodule on CXR)    Surgeries: Procedure(s): UNICOMPARTMENTAL KNEE on 11/04/2018   Consultants (if any):   Discharged Condition: Improved  Hospital Course: Brenda [name] is a 63 y.o. female who was admitted 11/04/2018 with a diagnosis of Primary osteoarthritis of right knee and went to the operating room on 11/04/2018 and underwent the above named procedures.    She was given perioperative antibiotics:  Anti-infectives (From admission, onward)   Start     Dose/Rate Route Frequency Ordered Stop   11/04/18 1600  vancomycin (VANCOCIN) IVPB 1000 mg/200 mL premix     1,000 mg 200 mL/hr over 60 Minutes Intravenous Every 12 hours 11/04/18 1127 11/04/18 1741   11/04/18 0600  clindamycin (CLEOCIN) IVPB 900 mg     900 mg 100 mL/hr over 30 Minutes Intravenous On call to O.R. 11/04/18 0867 11/04/18 6195    .  She was given sequential compression devices, early ambulation, and Asprin for DVT prophylaxis.  She benefited maximally from the hospital stay and there were no complications.    Recent vital signs:  Vitals:   11/05/18 0224 11/05/18 0558  BP: 124/63 (!) 143/97  Pulse: 70 76  Resp: 17 18  Temp: 98.2 F (36.8 C) 98.4 F (36.9 C)  SpO2: 96% 95%    Recent laboratory studies:  Lab Results  Component Value Date   HGB 13.2 10/27/2018   HGB 15.1 (H) 12/05/2017   HGB 12.8 05/10/2015   Lab Results  Component Value Date   WBC 7.7 10/27/2018   PLT 218 10/27/2018   Lab Results  Component  Value Date   INR 0.97 05/10/2015   Lab Results  Component Value Date   NA 141 10/27/2018   K 4.2 10/27/2018   CL 107 10/27/2018   CO2 26 10/27/2018   BUN 14 10/27/2018   CREATININE 0.57 10/27/2018   GLUCOSE 111 (H) 10/27/2018    Discharge Medications:   Allergies as of 11/05/2018      Reactions   Aspirin Other (See Comments)    GI Bleed   Beta Adrenergic Blockers Other (See Comments)   REACTION: decreased libido   Nsaids Other (See Comments)    GI Upset   Penicillins Swelling, Other (See Comments)   DID THE REACTION INVOLVE: Swelling of the face/tongue/throat, SOB, or low BP? Yes Sudden or severe rash/hives, skin peeling, or the inside of the  mouth or nose? Yes Did it require medical treatment? Yes When did it last happen? 63 years old If all above answers are "NO", may proceed with cephalosporin use.   Prochlorperazine Edisylate Other (See Comments)    Stroke like symptoms   Sulfonamide Derivatives Other (See Comments)   Unknown allergic reaction   Amitriptyline Hcl Palpitations, Other (See Comments)    increased heart rate      Medication List    TAKE these medications   acetaminophen 500 MG tablet Commonly known as:  TYLENOL Take 2 tablets (1,000 mg total) by mouth every 8 (eight) hours for 14 days. For Pain. What changed:    how much to take  when to take this  reasons to take this  additional instructions   albuterol 108 (90 Base) MCG/ACT inhaler Commonly known as:  PROVENTIL HFA;VENTOLIN HFA Inhale 2 puffs into the lungs every 4 (four) hours as needed for wheezing or shortness of breath (bronchitis). Reported on 03/12/2016   aspirin EC 81 MG tablet Take 1 tablet (81 mg total) by mouth 2 (two) times daily. For DVT prophylaxis for 30 days after surgery.   clopidogrel 75 MG tablet Commonly known as:  PLAVIX Take 1 tablet (75 mg total) by mouth daily.   cyclobenzaprine 10 MG tablet Commonly known as:  FLEXERIL take 1 tablet by mouth every 8 hours if needed What changed:    how much to take  how to take this  when to take this  reasons to take this  additional instructions   docusate sodium 100 MG capsule Commonly known as:  COLACE Take 1 capsule (100 mg total) by mouth 2 (two) times daily. To prevent constipation while taking pain medication.   fenofibrate 160 MG tablet take 1 tablet by mouth once daily with food What changed:    how much to take  how to take this  when to take this  additional instructions   gabapentin 300 MG capsule Commonly known as:  NEURONTIN Take 1 capsule (300 mg total) by mouth 3 (three) times daily for 14 days. For 2 weeks post op for pain.   LORazepam 1 MG  tablet Commonly known as:  ATIVAN Take 1 tablet (1 mg total) by mouth 2 (two) times daily.   nitroGLYCERIN 0.4 MG SL tablet Commonly known as:  NITROSTAT Place 1 tablet (0.4 mg total) under the tongue every 5 (five) minutes as needed for chest pain (MAX 3 doses.).   ondansetron 4 MG tablet Commonly known as:  ZOFRAN Take 1 tablet (4 mg total) by mouth every 8 (eight) hours as needed for nausea or vomiting.   oxyCODONE 5 MG immediate release tablet Commonly known as:  ROXICODONE  Take 1 tablet (5 mg total) by mouth every 4 (four) hours as needed for breakthrough pain. What changed:    how much to take  how to take this  when to take this  reasons to take this  additional instructions   pantoprazole 40 MG tablet Commonly known as:  PROTONIX Take 1 tablet (40 mg total) by mouth daily.   rosuvastatin 40 MG tablet Commonly known as:  CRESTOR Take 1 tablet (40 mg total) by mouth daily.       Diagnostic Studies: Dg Knee Right Port  Result Date: 11/04/2018 CLINICAL DATA:  Right partial knee replacement. EXAM: PORTABLE RIGHT KNEE - 1-2 VIEW COMPARISON:  Preoperative study of April 07, 2018 FINDINGS: The patient has undergone hemiarthroplasty of the right knee centered on the medial compartment. Radiographic positioning of the prosthetic components appears good. The interface with the native bone appears normal. IMPRESSION: The patient has undergone hemiarthroplasty of the medial compartment of the right knee. No immediate postprocedure complication. Electronically Signed   By: David  Martinique M.D.   On: 11/04/2018 10:17    Disposition: Discharge disposition: 01-Home or Self Care       Discharge Instructions    Discharge patient   Complete by:  As directed    After PM therapy   Discharge disposition:  01-Home or Self Care   Discharge patient date:  11/05/2018      Follow-up Information    Renette Butters, MD. Go on 11/19/2018.   Specialty:  Orthopedic Surgery Why:  Your  appointment has been set for 4:15 Contact information: [address] [address] [phone]        [facility]. Go on 11/07/2018.   Why:  You are scheduled to start outpatient physical therapy on 11/07/18 at 1:45. Please arrive 15 minutes early to complete your paperwork.  Contact information: [address]  [place], [place]           Signed: Prudencio Burly III PA-C 11/05/2018, 7:42 AM

## 2018-11-05 NOTE — Plan of Care (Signed)
  Problem: Clinical Measurements: Goal: Cardiovascular complication will be avoided Outcome: Progressing   Problem: Nutrition: Goal: Adequate nutrition will be maintained Outcome: Progressing   Problem: Pain Managment: Goal: General experience of comfort will improve Outcome: Progressing   Problem: Education: Goal: Knowledge of the prescribed therapeutic regimen will improve Outcome: Progressing

## 2018-11-05 NOTE — Anesthesia Postprocedure Evaluation (Signed)
Anesthesia Post Note  Patient: Brenda [name]  Procedure(s) Performed: UNICOMPARTMENTAL KNEE (Right Knee)     Patient location during evaluation: PACU Anesthesia Type: General Level of consciousness: awake and alert Pain management: pain level controlled Vital Signs Assessment: post-procedure vital signs reviewed and stable Respiratory status: spontaneous breathing, nonlabored ventilation, respiratory function stable and patient connected to nasal cannula oxygen Cardiovascular status: blood pressure returned to baseline and stable Postop Assessment: no apparent nausea or vomiting Anesthetic complications: no    Last Vitals:  Vitals:   11/05/18 0224 11/05/18 0558  BP: 124/63 (!) 143/97  Pulse: 70 76  Resp: 17 18  Temp: 36.8 C 36.9 C  SpO2: 96% 95%    Last Pain:  Vitals:   11/05/18 0558  TempSrc: Oral  PainSc:                  [place] S

## 2018-11-05 NOTE — Progress Notes (Signed)
Physical Therapy Treatment Patient Details Name: Brenda [name] MRN: [id number] DOB: 08-15-1956 Today's Date: 11/05/2018    History of Present Illness Pt is a 63 year old female s/p R unicompartmenal knee    PT Comments    Pt ambulated in hallway again and wished to perform steps this afternoon.  Pt mobilizing well and feels ready for d/c home today.  Pt provided with HEP handout and had no further questions.   Follow Up Recommendations  Follow surgeon's recommendation for DC plan and follow-up therapies     Equipment Recommendations  None recommended by PT    Recommendations for Other Services       Precautions / Restrictions Precautions Precautions: Fall;Knee Precaution Comments: pt able to perform SLR Required Braces or Orthoses: Knee Immobilizer - Right Restrictions Weight Bearing Restrictions: No Other Position/Activity Restrictions: WBAT    Mobility  Bed Mobility Overal bed mobility: Needs Assistance Bed Mobility: Supine to Sit     Supine to sit: Supervision     General bed mobility comments: verbal cues for technique  Transfers Overall transfer level: Needs assistance Equipment used: Rolling walker (2 wheeled) Transfers: Sit to/from Stand Sit to Stand: Supervision         General transfer comment: verbal cues for UE and LE positioning  Ambulation/Gait Ambulation/Gait assistance: Supervision Gait Distance (Feet): 200 Feet Assistive device: Rolling walker (2 wheeled) Gait Pattern/deviations: Antalgic;Step-through pattern;Decreased stance time - right     General Gait Details: verbal cues for sequence, RW positioning   Stairs Stairs: Yes Stairs assistance: Min guard Stair Management: One rail Left;Step to pattern;Forwards Number of Stairs: 3 General stair comments: verbal cues for sequence and safety, performed twice, pt reports understanding and pleased with only little pain (reports too painful to perform steps prior to  surgery)   Wheelchair Mobility    Modified Rankin (Stroke Patients Only)       Balance Overall balance assessment: No apparent balance deficits (not formally assessed)                                          Cognition Arousal/Alertness: Awake/alert Behavior During Therapy: WFL for tasks assessed/performed Overall Cognitive Status: Within Functional Limits for tasks assessed                                        Exercises     General Comments        Pertinent Vitals/Pain Pain Assessment: 0-10 Pain Score: 4  Pain Location: R knee Pain Descriptors / Indicators: Aching;Sore Pain Intervention(s): Limited activity within patient's tolerance;Monitored during session    [place] expects to be discharged to:: Private residence Living Arrangements: Spouse/significant other   Type of Home: House Home Access: Stairs to enter Entrance Stairs-Rails: Right Home Layout: One level Home Equipment: Environmental consultant - 2 wheels;Bedside commode;Shower seat      Prior Function Level of Independence: Independent          PT Goals (current goals can now be found in the care plan section) Acute Rehab PT Goals Patient Stated Goal: "to go home " Progress towards PT goals: Progressing toward goals    Frequency    7X/week      PT Plan Current plan remains appropriate    Co-evaluation  AM-PAC PT "6 Clicks" Mobility   Outcome Measure  Help needed turning from your back to your side while in a flat bed without using bedrails?: None Help needed moving from lying on your back to sitting on the side of a flat bed without using bedrails?: A Little Help needed moving to and from a bed to a chair (including a wheelchair)?: A Little Help needed standing up from a chair using your arms (e.g., wheelchair or bedside chair)?: A Little Help needed to walk in hospital room?: A Little Help needed climbing 3-5 steps with a  railing? : A Little 6 Click Score: 19    End of Session Equipment Utilized During Treatment: Gait belt Activity Tolerance: Patient tolerated treatment well Patient left: in bed;with call bell/phone within reach;with family/visitor present(sitting EOB with lunch, R LE resting straight) Nurse Communication: Mobility status PT Visit Diagnosis: Difficulty in walking, not elsewhere classified (R26.2)     Time: 3354-5625 PT Time Calculation (min) (ACUTE ONLY): 13 min  Charges:  $Gait Training: 8-22 mins                    Carmelia Bake, PT, DPT Acute Rehabilitation Services Office: [phone] Pager: [place] 11/05/2018, 2:36 PM

## 2018-11-05 NOTE — Progress Notes (Signed)
Occupational Therapy Evaluation Patient Details Name: Brenda [name] MRN: [id number] DOB: September 21, 1956 Today's Date: 11/05/2018    History of Present Illness Pt is a 63 year old female s/p R unicompartmenal knee   Clinical Impression   Completed all education regarding functional mobility for ADL and home safety to reduce risk of falls. Family present for education. OT signing off.     Follow Up Recommendations  No OT follow up;Supervision - Intermittent    Equipment Recommendations  None recommended by OT    Recommendations for Other Services       Precautions / Restrictions Precautions Precautions: Fall;Knee Restrictions Weight Bearing Restrictions: No Other Position/Activity Restrictions: WBAT      Mobility Bed Mobility Overal bed mobility: Modified Independent             General bed mobility comments: verbal cues for technique  Transfers Overall transfer level: Needs assistance Equipment used: Rolling walker (2 wheeled) Transfers: Sit to/from Stand Sit to Stand: Supervision         General transfer comment: vd for hand positioning    Balance Overall balance assessment: No apparent balance deficits (not formally assessed)                                         ADL either performed or assessed with clinical judgement   ADL Overall ADL's : Needs assistance/impaired                                     Functional mobility during ADLs: Modified independent;Rolling walker;Cueing for safety General ADL Comments: Educated pt on safe transfer technique for shower.Pt able to return demonstrate. Educated pt on compensatory strateigies for ADL adn reducing risk of falls. Son presetn for education.      Vision         Perception     Praxis      Pertinent Vitals/Pain Pain Assessment: 0-10 Pain Score: 4  Pain Location: R knee Pain Descriptors / Indicators: Aching;Sore Pain Intervention(s): Limited activity within  patient's tolerance;Repositioned;Ice applied     Hand Dominance Right   Extremity/Trunk Assessment Upper Extremity Assessment Upper Extremity Assessment: Defer to OT evaluation   Lower Extremity Assessment Lower Extremity Assessment: Defer to PT evaluation   Cervical / Trunk Assessment Cervical / Trunk Assessment: Other exceptions(hx of back surgeries)   Communication Communication Communication: No difficulties   Cognition Arousal/Alertness: Awake/alert Behavior During Therapy: WFL for tasks assessed/performed Overall Cognitive Status: Within Functional Limits for tasks assessed                                     General Comments       Exercises Exercises: Other exercises Other Exercises Other Exercises: encouraged use of zero knee foam for 20 minute intervals   Shoulder Instructions      Home Living Family/patient expects to be discharged to:: Private residence Living Arrangements: Spouse/significant other   Type of Home: House Home Access: Stairs to enter Technical brewer of Steps: 3 Entrance Stairs-Rails: Right Home Layout: One level     Bathroom Shower/Tub: Occupational psychologist: Handicapped height Bathroom Accessibility: Yes How Accessible: Accessible via walker Home Equipment: [place] - 2 wheels;Bedside commode;Shower seat  Prior Functioning/Environment Level of Independence: Independent                 OT Problem List: Decreased strength;Decreased range of motion;Decreased knowledge of use of DME or AE;Pain      OT Treatment/Interventions:      OT Goals(Current goals can be found in the care plan section) Acute Rehab OT Goals Patient Stated Goal: "to go home " OT Goal Formulation: All assessment and education complete, DC therapy  OT Frequency:     Barriers to D/C:            Co-evaluation              AM-PAC OT "6 Clicks" Daily Activity     Outcome Measure Help from another person  eating meals?: None Help from another person taking care of personal grooming?: None Help from another person toileting, which includes using toliet, bedpan, or urinal?: None Help from another person bathing (including washing, rinsing, drying)?: A Little Help from another person to put on and taking off regular upper body clothing?: None Help from another person to put on and taking off regular lower body clothing?: A Little 6 Click Score: 22   End of Session Equipment Utilized During Treatment: Rolling walker CPM Right Knee CPM Right Knee: Off Nurse Communication: Mobility status  Activity Tolerance: Patient tolerated treatment well Patient left: in bed;with call bell/phone within reach;with family/visitor present  OT Visit Diagnosis: Pain Pain - Right/Left: Right Pain - part of body: Knee                Time: 3267-1245 OT Time Calculation (min): 26 min Charges:  OT General Charges $OT Visit: 1 Visit OT Evaluation $OT Eval Low Complexity: 1 Low OT Treatments $Self Care/Home Management : 8-22 mins  Maurie Boettcher, OT/L   Acute OT Clinical Specialist Acute Rehabilitation Services Pager [phone] Office [phone]   [facility] 11/05/2018, 12:19 PM

## 2018-11-05 NOTE — Progress Notes (Signed)
Physical Therapy Treatment Patient Details Name: Brenda [name] MRN: [id number] DOB: 1956/01/19 Today's Date: 11/05/2018    History of Present Illness Pt is a 63 year old female s/p R unicompartmenal knee    PT Comments    Pt ambulated in hallway, practiced steps and performed LE exercises.  Pt wishes to have one more session prior to d/c.  Pt appears ready for d/c home today.  Follow Up Recommendations  Follow surgeon's recommendation for DC plan and follow-up therapies     Equipment Recommendations  None recommended by PT    Recommendations for Other Services       Precautions / Restrictions Precautions Precautions: Fall;Knee Precaution Comments: pt able to perform SLR Required Braces or Orthoses: Knee Immobilizer - Right Restrictions Weight Bearing Restrictions: No Other Position/Activity Restrictions: WBAT    Mobility  Bed Mobility Overal bed mobility: Needs Assistance Bed Mobility: Supine to Sit     Supine to sit: HOB elevated;Supervision     General bed mobility comments: verbal cues for technique  Transfers Overall transfer level: Needs assistance Equipment used: Rolling walker (2 wheeled) Transfers: Sit to/from Stand Sit to Stand: Supervision         General transfer comment: verbal cues for UE and LE positioning  Ambulation/Gait Ambulation/Gait assistance: Min guard;Supervision Gait Distance (Feet): 160 Feet Assistive device: Rolling walker (2 wheeled) Gait Pattern/deviations: Antalgic;Step-through pattern;Decreased stance time - right     General Gait Details: verbal cues for sequence, RW positioning, posture, pt self cued heel strike ("I've been watching youtube videos")   Stairs Stairs: Yes Stairs assistance: Min guard Stair Management: One rail Left;Step to pattern;Forwards Number of Stairs: 3 General stair comments: performed with RW backwards with cues for sequence and safety; pt wished to try with rail so cued for sequence and safety  again as above   Wheelchair Mobility    Modified Rankin (Stroke Patients Only)       Balance Overall balance assessment: No apparent balance deficits (not formally assessed)                                          Cognition Arousal/Alertness: Awake/alert Behavior During Therapy: WFL for tasks assessed/performed Overall Cognitive Status: Within Functional Limits for tasks assessed                                        Exercises Total Joint Exercises Ankle Circles/Pumps: AROM;10 reps;Both Quad Sets: AROM;10 reps;Right Short Arc Quad: AROM;10 reps;Right Heel Slides: 10 reps;Right;AAROM Hip ABduction/ADduction: AROM;10 reps;Right Straight Leg Raises: Right;10 reps;AROM Goniometric ROM: approx -5-95* AAROM R knee  Other Exercises Other Exercises: encouraged use of zero knee foam for 20 minute intervals    General Comments        Pertinent Vitals/Pain Pain Assessment: 0-10 Pain Score: 4  Pain Location: R knee Pain Descriptors / Indicators: Aching;Sore Pain Intervention(s): Limited activity within patient's tolerance;Premedicated before session;Monitored during session;Repositioned;Ice applied    Home Living Family/patient expects to be discharged to:: Private residence Living Arrangements: Spouse/significant other   Type of Home: House Home Access: Stairs to enter Entrance Stairs-Rails: Right Home Layout: One level Home Equipment: Environmental consultant - 2 wheels;Bedside commode;Shower seat      Prior Function Level of Independence: Independent          PT  Goals (current goals can now be found in the care plan section) Acute Rehab PT Goals Patient Stated Goal: "to go home " Progress towards PT goals: Progressing toward goals    Frequency    7X/week      PT Plan Current plan remains appropriate    Co-evaluation              AM-PAC PT "6 Clicks" Mobility   Outcome Measure  Help needed turning from your back to your side  while in a flat bed without using bedrails?: None Help needed moving from lying on your back to sitting on the side of a flat bed without using bedrails?: A Little Help needed moving to and from a bed to a chair (including a wheelchair)?: A Little Help needed standing up from a chair using your arms (e.g., wheelchair or bedside chair)?: A Little Help needed to walk in hospital room?: A Little Help needed climbing 3-5 steps with a railing? : A Little 6 Click Score: 19    End of Session Equipment Utilized During Treatment: Gait belt Activity Tolerance: Patient tolerated treatment well Patient left: in bed;with call bell/phone within reach Nurse Communication: Mobility status PT Visit Diagnosis: Difficulty in walking, not elsewhere classified (R26.2)     Time: 2707-8675 PT Time Calculation (min) (ACUTE ONLY): 25 min  Charges:  $Gait Training: 8-22 mins $Therapeutic Exercise: 8-22 mins                     Carmelia Bake, PT, DPT Acute Rehabilitation Services Office: [phone] Pager: [place] E 11/05/2018, 12:49 PM

## 2018-11-06 ENCOUNTER — Encounter (HOSPITAL_COMMUNITY): Payer: Self-pay | Admitting: Orthopedic Surgery

## 2018-11-07 ENCOUNTER — Telehealth (HOSPITAL_COMMUNITY): Payer: Self-pay

## 2018-11-07 ENCOUNTER — Ambulatory Visit (HOSPITAL_COMMUNITY): Payer: Medicare Other

## 2018-11-07 ENCOUNTER — Encounter (HOSPITAL_COMMUNITY): Payer: Self-pay

## 2018-11-07 NOTE — Telephone Encounter (Signed)
Per Izora Gala, pt cancelled due to being sick and having fever of 102deg. 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 She did not answer and this PT asked pt to call our office back so that she and I could speak.   Geraldine Solar PT, DPT

## 2018-11-10 ENCOUNTER — Encounter (HOSPITAL_COMMUNITY): Payer: Self-pay

## 2018-11-10 ENCOUNTER — Other Ambulatory Visit: Payer: Self-pay

## 2018-11-10 ENCOUNTER — Ambulatory Visit (HOSPITAL_COMMUNITY): Payer: Medicare Other | Attending: Orthopedic Surgery

## 2018-11-10 DIAGNOSIS — M25661 Stiffness of right knee, not elsewhere classified: Secondary | ICD-10-CM | POA: Insufficient documentation

## 2018-11-10 DIAGNOSIS — R600 Localized edema: Secondary | ICD-10-CM | POA: Diagnosis not present

## 2018-11-10 DIAGNOSIS — R262 Difficulty in walking, not elsewhere classified: Secondary | ICD-10-CM | POA: Diagnosis not present

## 2018-11-10 DIAGNOSIS — M25561 Pain in right knee: Secondary | ICD-10-CM | POA: Insufficient documentation

## 2018-11-10 NOTE — Therapy (Signed)
[place], [place], [address] Phone: [phone]   Fax:  [phone]  Physical Therapy Evaluation  Patient Details  Name: Brenda [name] MRN: [id number] Date of Birth: [birth year] Referring Provider (PT): Edmonia Lynch, MD   Encounter Date: 11/10/2018  PT End of Session - 11/10/18 1056    Visit Number  1    Number of Visits  19    Date for PT Re-Evaluation  12/22/18   mini reassess 12/01/18   Authorization Type  Medicare    Authorization Time Period  11/10/18 top 12/22/18    Authorization - Visit Number  1    Authorization - Number of Visits  10    PT Start Time  0905    PT Stop Time  0944    PT Time Calculation (min)  39 min    Activity Tolerance  Patient limited by pain    Behavior During Therapy  [facility] for tasks assessed/performed       Past Medical History:  Diagnosis Date  . Aortic valve stenosis    "very mild"  . Atypical chest pain 2007; 2015   cardiolyte neg, echo nl, cath showed mild/nonobstructive LAD disease  . Bilateral primary osteoarthritis of knee 06/30/2012   Right >L.  Plan for medial compartment knee arthroplasty as of 09/2018.  [place] CAD (coronary artery disease)   . Chronic LBP    Multiple surgeries  . COPD (chronic obstructive pulmonary disease) ([place])   . DDD (degenerative disc disease)    spinal stenosis  . Fatty liver   . History of GI bleed    NSAIDS  . Hyperlipidemia    mixed  . Microscopic hematuria    full w/ u unrevealing X 2  . Normal nuclear stress test 11/11 and 06/2014   negative, EF normal  . Palpitations 2006   48H holter neg  . PONV (postoperative nausea and vomiting)    "never threw up but felt sick on my stomach", also believes that she was aware of surgery during her hysterectomy   . Pre-diabetes   . Prediabetes    Highest A1c 6.1% as of 03/2017  . RBBB (right bundle branch block)   . Recurrent UTI   . Tobacco dependence in remission    Quit fall 2015.  Only  occasional bronchitis illness (CT shest 2009 nl- for f/u of ? nodule on CXR)    Past Surgical History:  Procedure Laterality Date  . ABDOMINAL HYSTERECTOMY  1997   DUB  . APPENDECTOMY  1984  . CARDIAC CATHETERIZATION  10/09/2005   no CAD, mildly elevated LVEDP, normal LV function (Dr. Gerrie Nordmann)  . CARDIAC CATHETERIZATION N/A 05/16/2015   Mild non-obstructive CAD--med mgmt.  Procedure: Left Heart Cath and Coronary Angiography;  Surgeon: Pixie Casino, MD;  Location: [facility];  Service: Cardiovascular;  Laterality: N/A;  . CARDIOVASCULAR STRESS TEST  06/2014   normal lexiscan NST  . CARDIOVASCULAR STRESS TEST  2006   persantine - no ischemia, low risk   . KNEE ARTHROSCOPY Bilateral   . left wrist ganglion cyst excision  2010  . [place]   right iliac crest bone graft+metal instrumentation; 2005 metal removal and decompression, 2011 lumbar decompression 4-11, then stabilization/ instrumentation done 09-19-10: L2,L3, L5 left and L2 , L3, L4 right pedical remnant L4 left embedded. Left iliac crest bone graft-- Dr Velna Ochs  . LUMBAR SPINE SURGERY  02/10/2016   Dr. Velna Ochs: lumbar  decompression, instrumentation removal, and fusion exploration--HELPED her a lot, esp her radicular leg pains.  . OOPHORECTOMY Right    cyst  . PARTIAL KNEE ARTHROPLASTY Right 11/04/2018   Procedure: UNICOMPARTMENTAL KNEE;  Surgeon: Renette Butters, MD;  Location: [facility];  Service: Orthopedics;  Laterality: Right;  Adductor Block  . TONSILLECTOMY  63 yrs old  . TONSILLECTOMY    . TRANSTHORACIC ECHOCARDIOGRAM  2006   EF=>55%, trace MR, mild TR, trace AV regurg, trace pulm valve regurg     There were no vitals filed for this visit.   Subjective Assessment - 11/10/18 0912    Subjective  Pt reports undergoing partial R knee replacement on 11/04/18 by Dr. Edmonia Lynch. Pain and bone-on-bone is what led to her surgery. She did not have HHPT. She cancelled her initially scheduled eval on  11/07/18 stating her stomach was really upset, no appetite, with a fever of 103.8; she did not go to the MD or ER and state she feels better today and doesn't think she has a fever. She is having the most difficulty moving and bending her knee. She is currently using a RW to assist with amb but prior to surgery she did not need an AD. Her knee pain is relatively well-managed with her pain medicine but her sleep is still being affected at night.     Limitations  Standing;Walking;House hold activities;Sitting    How long can you sit comfortably?  5 mins    How long can you stand comfortably?  maybe 10 mins    How long can you walk comfortably?  <5 mins    Patient Stated Goals  get back to where she can move her knee independently     Currently in Pain?  Yes    Pain Score  4     Pain Location  Knee    Pain Orientation  Right    Pain Descriptors / Indicators  Aching;Dull;Sharp    Pain Type  Surgical pain    Pain Onset  In the past 7 days    Pain Frequency  Constant    Aggravating Factors   moving, WB    Pain Relieving Factors  ice pack, CPM    Effect of Pain on Daily Activities  increases         [facility] PT Assessment - 11/10/18 0001      Assessment   Medical Diagnosis  s/p partial R knee replacement    Referring Provider (PT)  Edmonia Lynch, MD    Onset Date/Surgical Date  11/04/18    Next MD Visit  11/18/18    Prior Therapy  yes for LBP      Balance Screen   Has the patient fallen in the past 6 months  No    Has the patient had a decrease in activity level because of a fear of falling?   No    Is the patient reluctant to leave their home because of a fear of falling?   No      Prior Function   Level of Independence  Independent   currently needing help to get dressed and bathed due to knee   Vocation  On disability    Leisure  play with dogs, grandchildren, puzzles, crochet      Observation/Other Assessments   Focus on Therapeutic Outcomes (FOTO)   to be completed next visit       Observation/Other Assessments-Edema    Edema  Circumferential      Circumferential  Edema   Circumferential - Right  40cm, joint line    Circumferential - Left   34cm, joint line      ROM / Strength   AROM / PROM / Strength  AROM;Strength      AROM   AROM Assessment Site  Knee    Right/Left Knee  Right    Right Knee Extension  15    Right Knee Flexion  62      Strength   Strength Assessment Site  Hip;Knee;Ankle    Right Hip Flexion  3-/5    Right Hip Extension  --   functionally weak   Right Hip ABduction  --   functionally weak   Left Hip Flexion  4/5    Left Hip Extension  --   functionally weak   Left Hip ABduction  4/5    Right Knee Extension  3-/5    Left Knee Extension  5/5    Right Ankle Dorsiflexion  5/5    Left Ankle Dorsiflexion  5/5      Palpation   Patella mobility  hypomobile      Ambulation/Gait   Ambulation Distance (Feet)  94 Feet   3MWT   Assistive device  Rolling walker    Gait Pattern  Step-through pattern;Step-to pattern;Decreased stance time - right;Decreased dorsiflexion - right;Decreased hip/knee flexion - right;Trendelenburg;Antalgic      Balance   Balance Assessed  Yes      Static Standing Balance   Static Standing - Balance Support  No upper extremity supported    Static Standing Balance -  Activities   Single Leg Stance - Right Leg;Single Leg Stance - Left Leg    Static Standing - Comment/# of Minutes  R: 0sec L: 12sec      Standardized Balance Assessment   Standardized Balance Assessment  Five Times Sit to Stand    Five times sit to stand comments   23.67sec, BUE, RLE extended            Objective measurements completed on examination: See above findings.         PT Education - 11/10/18 1056    Education Details  exam findings, HEP, POC    Person(s) Educated  Patient    Methods  Explanation;Demonstration;Handout    Comprehension  Verbalized understanding       PT Short Term Goals - 11/10/18 1102      PT SHORT  TERM GOAL #1   Title  Pt will have reduced edema by 2cm or > at joint line in order to reduce pain and improve ROM.     Time  3    Period  Weeks    Status  New    Target Date  12/01/18      PT SHORT TERM GOAL #2   Title  Pt will have improved R knee AROM from 8-90deg in order to reduce pain and improve sitting tolerance.     Time  3    Period  Weeks    Status  New      PT SHORT TERM GOAL #3   Title  Pt will have improved MMT to 4/5 throughout all mm groups in order to maximize balance, gait, and return to PLOF.    Time  3    Period  Weeks    Status  New      PT SHORT TERM GOAL #4   Title  Pt will be able to perform 5xSTS in 15sec or < without  UE and with proper form to demo improved functional strength and balance    Time  3    Period  Weeks    Status  New        PT Long Term Goals - 11/10/18 1102      PT LONG TERM GOAL #1   Title  Pt will have improved R knee AROM from 0-115deg or better in order to further reduce pain, maximize gait, and stair ambulation.     Time  6    Period  Weeks    Status  New    Target Date  12/22/18      PT LONG TERM GOAL #2   Title  Pt will have improved MMT to 4+/5 throughout all mm groups in order to further maximize balance, gait, and tolerance to functional activities.     Time  6    Period  Weeks    Status  New      PT LONG TERM GOAL #3   Title  Pt will be able to perform bil SLS for 15sec or > without UE support to demo improved balance and functional strength to allow her to navigate stairs and ambulate on uneven ground with greater ease.     Time  6    Period  Weeks    Status  New      PT LONG TERM GOAL #4   Title  Pt will be able to ambulate 359ft or > during 3MWT without AD and gait WFL in order to demo improved functional mobility and maximize return to PLOF.     Time  6    Period  Weeks    Status  New             Plan - 11/10/18 1057    Clinical Impression Statement  Pt is pleasant 62YO F who presents to OPPT 6  days post-op from R partial knee replacement by Dr. Edmonia Lynch. Pt presents with post-op deficits in pain, edema, ROM, MMT, balance, gait, functional strength, and functional mobility. Pt was initially scheduled for her evaluation at this clinic 3days post-op but cancelled due to being sick with a fever; this PT educated pt on her risk of infection post-op and assessed her R knee. It was noted to have edema and some warmth but this appeared to be WNL for being post-op and minimal to no redness noted at the knee joint, however, will continue to keep close watch over this moving forward and continue to assess if pt has fever or not. Pt's AROM 15-62deg this date and a full MMT assessment was limited due to pain. Pt needs skilled PT intervention to address these deficits in order to reduce pain and improve ROM and return to PLOF.     History and Personal Factors relevant to plan of care:  see medical history above    Clinical Presentation  Stable    Clinical Presentation due to:  see flowsheets for objective tests and measures    Clinical Decision Making  Low    Rehab Potential  Good    PT Frequency  3x / week    PT Duration  6 weeks    PT Treatment/Interventions  ADLs/Self Care Home Management;Aquatic Therapy;Cryotherapy;Electrical Stimulation;DME Instruction;Gait training;Stair training;Functional mobility training;Therapeutic activities;Therapeutic exercise;Balance training;Neuromuscular re-education;Patient/family education;Manual techniques;Manual lymph drainage;Scar mobilization;Passive range of motion;Dry needling;Energy conservation;Taping    PT Next Visit Plan  review goals; administer FOTO, address edema and ROM initially prior to heavy strengthening  PT Home Exercise Plan  eval: supine heel slide, quad set, supine HS stretch with rope    Consulted and Agree with Plan of Care  Patient       Patient will benefit from skilled therapeutic intervention in order to improve the following  deficits and impairments:  Abnormal gait, Decreased activity tolerance, Decreased balance, Decreased mobility, Decreased range of motion, Decreased scar mobility, Decreased strength, Difficulty walking, Hypomobility, Increased edema, Increased fascial restricitons, Increased muscle spasms, Impaired flexibility, Improper body mechanics, Postural dysfunction, Pain  Visit Diagnosis: Acute pain of right knee - Plan: PT plan of care cert/re-cert  Stiffness of right knee, not elsewhere classified - Plan: PT plan of care cert/re-cert  Localized edema - Plan: PT plan of care cert/re-cert  Difficulty in walking, not elsewhere classified - Plan: PT plan of care cert/re-cert     Problem List Patient Active Problem List   Diagnosis Date Noted  . Primary osteoarthritis of right knee 10/13/2018  . Chronic narcotic use 06/12/2016  . Encounter for chronic pain management 06/12/2016  . Bilateral sciatica 02/10/2016  . Spinal stenosis of lumbar region without neurogenic claudication 02/10/2016  . Gastroesophageal reflux disease without esophagitis 01/30/2016  . Chronic low back pain 10/12/2015  . Unstable angina ([place]) 05/04/2015  . CAD (coronary artery disease) 06/25/2014  . Chest pain 06/25/2014  . Paresthesia of both feet 03/11/2014  . Paresthesia of skin 03/11/2014  . Health maintenance examination 11/12/2013  . Acute exacerbation of chronic obstructive pulmonary disease (COPD) ([place]) 08/13/2013  . Arthritis of knee, degenerative 04/10/2013  . H/O Spinal surgery 03/11/2013  . Degenerative disc disease, lumbar 03/11/2013  . Degenerative arthritis of right knee 03/11/2013  . Anxiety state 03/03/2013  . COPD (chronic obstructive pulmonary disease) ([place]) 03/03/2013  . Generalized anxiety disorder 03/03/2013  . Osteoarthritis of knee 06/30/2012  . Right knee pain 06/26/2012  . Colon cancer screening 02/21/2012  . Breast cancer screening 02/21/2012  . Prediabetes 11/09/2010  . Hyperlipidemia  11/09/2010  . TOBACCO ABUSE 11/09/2010  . BACK PAIN, LUMBAR, CHRONIC 11/09/2010  . HEMATURIA, MICROSCOPIC, HX OF 11/09/2010        Geraldine Solar PT, DPT  [facility] [address] [place], [place], [address] Phone: [phone]   Fax:  [phone]  Name: NEVAYAH FAUST MRN: [id number] Date of Birth: [birth year]

## 2018-11-12 ENCOUNTER — Ambulatory Visit (HOSPITAL_COMMUNITY): Payer: Medicare Other

## 2018-11-12 ENCOUNTER — Encounter (HOSPITAL_COMMUNITY): Payer: Self-pay

## 2018-11-12 DIAGNOSIS — M25661 Stiffness of right knee, not elsewhere classified: Secondary | ICD-10-CM

## 2018-11-12 DIAGNOSIS — R600 Localized edema: Secondary | ICD-10-CM

## 2018-11-12 DIAGNOSIS — R262 Difficulty in walking, not elsewhere classified: Secondary | ICD-10-CM

## 2018-11-12 DIAGNOSIS — M25561 Pain in right knee: Secondary | ICD-10-CM

## 2018-11-12 NOTE — Therapy (Signed)
[place], [place], [address] Phone: [phone]   Fax:  [phone]  Physical Therapy Treatment  Patient Details  Name: Brenda [name] MRN: [id number] Date of Birth: Jun 27, 1956 Referring Provider (PT): Edmonia Lynch, MD   Encounter Date: 11/12/2018  PT End of Session - 11/12/18 1435    Visit Number  2    Number of Visits  19    Date for PT Re-Evaluation  12/22/18   mini reassess 12/01/18   Authorization Type  Medicare    Authorization Time Period  11/10/18 top 12/22/18    Authorization - Visit Number  2    Authorization - Number of Visits  10    PT Start Time  2992    PT Stop Time  1515    PT Time Calculation (min)  41 min    Activity Tolerance  Patient limited by pain    Behavior During Therapy  [facility] for tasks assessed/performed       Past Medical History:  Diagnosis Date  . Aortic valve stenosis    "very mild"  . Atypical chest pain 2007; 2015   cardiolyte neg, echo nl, cath showed mild/nonobstructive LAD disease  . Bilateral primary osteoarthritis of knee 06/30/2012   Right >L.  Plan for medial compartment knee arthroplasty as of 09/2018.  Marland Kitchen CAD (coronary artery disease)   . Chronic LBP    Multiple surgeries  . COPD (chronic obstructive pulmonary disease) ([place])   . DDD (degenerative disc disease)    spinal stenosis  . Fatty liver   . History of GI bleed    NSAIDS  . Hyperlipidemia    mixed  . Microscopic hematuria    full w/ u unrevealing X 2  . Normal nuclear stress test 11/11 and 06/2014   negative, EF normal  . Palpitations 2006   48H holter neg  . PONV (postoperative nausea and vomiting)    "never threw up but felt sick on my stomach", also believes that she was aware of surgery during her hysterectomy   . Pre-diabetes   . Prediabetes    Highest A1c 6.1% as of 03/2017  . RBBB (right bundle branch block)   . Recurrent UTI   . Tobacco dependence in remission    Quit fall 2015.  Only  occasional bronchitis illness (CT shest 2009 nl- for f/u of ? nodule on CXR)    Past Surgical History:  Procedure Laterality Date  . ABDOMINAL HYSTERECTOMY  1997   DUB  . APPENDECTOMY  1984  . CARDIAC CATHETERIZATION  10/09/2005   no CAD, mildly elevated LVEDP, normal LV function (Dr. Gerrie Nordmann)  . CARDIAC CATHETERIZATION N/A 05/16/2015   Mild non-obstructive CAD--med mgmt.  Procedure: Left Heart Cath and Coronary Angiography;  Surgeon: Pixie Casino, MD;  Location: [facility];  Service: Cardiovascular;  Laterality: N/A;  . CARDIOVASCULAR STRESS TEST  06/2014   normal lexiscan NST  . CARDIOVASCULAR STRESS TEST  2006   persantine - no ischemia, low risk   . KNEE ARTHROSCOPY Bilateral   . left wrist ganglion cyst excision  2010  . [place]   right iliac crest bone graft+metal instrumentation; 2005 metal removal and decompression, 2011 lumbar decompression 4-11, then stabilization/ instrumentation done 09-19-10: L2,L3, L5 left and L2 , L3, L4 right pedical remnant L4 left embedded. Left iliac crest bone graft-- Dr Velna Ochs  . LUMBAR SPINE SURGERY  02/10/2016   Dr. Velna Ochs: lumbar  decompression, instrumentation removal, and fusion exploration--HELPED her a lot, esp her radicular leg pains.  . OOPHORECTOMY Right    cyst  . PARTIAL KNEE ARTHROPLASTY Right 11/04/2018   Procedure: UNICOMPARTMENTAL KNEE;  Surgeon: Renette Butters, MD;  Location: [facility];  Service: Orthopedics;  Laterality: Right;  Adductor Block  . TONSILLECTOMY  63 yrs old  . TONSILLECTOMY    . TRANSTHORACIC ECHOCARDIOGRAM  2006   EF=>55%, trace MR, mild TR, trace AV regurg, trace pulm valve regurg     There were no vitals filed for this visit.  Subjective Assessment - 11/12/18 1435    Subjective  Pt states that she has had a rough day with being frustrated that she can't move her leg like she wants. Current pain 3-4/10.    Limitations  Standing;Walking;House hold activities;Sitting    How long can  you sit comfortably?  5 mins    How long can you stand comfortably?  maybe 10 mins    How long can you walk comfortably?  <5 mins    Patient Stated Goals  get back to where she can move her knee independently     Currently in Pain?  Yes    Pain Score  4     Pain Location  Knee    Pain Orientation  Right    Pain Descriptors / Indicators  Aching;Dull;Sharp    Pain Type  Surgical pain    Pain Onset  In the past 7 days    Pain Frequency  Constant    Aggravating Factors   moving, WB    Pain Relieving Factors  ice pack, CPM    Effect of Pain on Daily Activities  increases         [facility] PT Assessment - 11/12/18 0001      Observation/Other Assessments   Focus on Therapeutic Outcomes (FOTO)   76% limited         [facility] Adult PT Treatment/Exercise - 11/12/18 0001      Exercises   Exercises  Knee/Hip      Knee/Hip Exercises: Supine   Quad Sets  Right;10 reps    Short Arc Quad Sets  Right;15 reps    Heel Slides  Right;10 reps    Heel Slides Limitations  supine with rope    Knee Extension Limitations  13    Knee Flexion Limitations  75      Manual Therapy   Manual Therapy  Edema management    Manual therapy comments  separate rest of treatment    Edema Management  retro massage with BLE elevated for edema and pain management            PT Education - 11/12/18 1435    Education Details  reveiwed goals, exercise technique, continue HEP    Person(s) Educated  Patient    Methods  Explanation;Demonstration    Comprehension  Verbalized understanding;Returned demonstration       PT Short Term Goals - 11/10/18 1102      PT SHORT TERM GOAL #1   Title  Pt will have reduced edema by 2cm or > at joint line in order to reduce pain and improve ROM.     Time  3    Period  Weeks    Status  New    Target Date  12/01/18      PT SHORT TERM GOAL #2   Title  Pt will have improved R knee AROM from 8-90deg in order to reduce pain and  improve sitting tolerance.     Time  3    Period   Weeks    Status  New      PT SHORT TERM GOAL #3   Title  Pt will have improved MMT to 4/5 throughout all mm groups in order to maximize balance, gait, and return to PLOF.    Time  3    Period  Weeks    Status  New      PT SHORT TERM GOAL #4   Title  Pt will be able to perform 5xSTS in 15sec or < without UE and with proper form to demo improved functional strength and balance    Time  3    Period  Weeks    Status  New        PT Long Term Goals - 11/10/18 1102      PT LONG TERM GOAL #1   Title  Pt will have improved R knee AROM from 0-115deg or better in order to further reduce pain, maximize gait, and stair ambulation.     Time  6    Period  Weeks    Status  New    Target Date  12/22/18      PT LONG TERM GOAL #2   Title  Pt will have improved MMT to 4+/5 throughout all mm groups in order to further maximize balance, gait, and tolerance to functional activities.     Time  6    Period  Weeks    Status  New      PT LONG TERM GOAL #3   Title  Pt will be able to perform bil SLS for 15sec or > without UE support to demo improved balance and functional strength to allow her to navigate stairs and ambulate on uneven ground with greater ease.     Time  6    Period  Weeks    Status  New      PT LONG TERM GOAL #4   Title  Pt will be able to ambulate 317ft or > during 3MWT without AD and gait WFL in order to demo improved functional mobility and maximize return to PLOF.     Time  6    Period  Weeks    Status  New            Plan - 11/12/18 1516    Clinical Impression Statement  Began by reviewing goals and administering FOTO; pt scored 76% limitation and had no f/u questions. Pain management focus of today's session with most of session performing manual techniques for edema and pain control. Pt visually and verbally emotionally during session which PT provided gentle encouragement for. AROM 13 to 75deg. Continue as planned, progressing as able.     Rehab Potential  Good     PT Frequency  3x / week    PT Duration  6 weeks    PT Treatment/Interventions  ADLs/Self Care Home Management;Aquatic Therapy;Cryotherapy;Electrical Stimulation;DME Instruction;Gait training;Stair training;Functional mobility training;Therapeutic activities;Therapeutic exercise;Balance training;Neuromuscular re-education;Patient/family education;Manual techniques;Manual lymph drainage;Scar mobilization;Passive range of motion;Dry needling;Energy conservation;Taping    PT Next Visit Plan  cotninue to address edema and ROM initially prior to heavy strengthening    PT Home Exercise Plan  eval: supine heel slide, quad set, supine HS stretch with rope    Consulted and Agree with Plan of Care  Patient       Patient will benefit from skilled therapeutic intervention in order to improve the following deficits  and impairments:  Abnormal gait, Decreased activity tolerance, Decreased balance, Decreased mobility, Decreased range of motion, Decreased scar mobility, Decreased strength, Difficulty walking, Hypomobility, Increased edema, Increased fascial restricitons, Increased muscle spasms, Impaired flexibility, Improper body mechanics, Postural dysfunction, Pain  Visit Diagnosis: Acute pain of right knee  Stiffness of right knee, not elsewhere classified  Localized edema  Difficulty in walking, not elsewhere classified     Problem List Patient Active Problem List   Diagnosis Date Noted  . Primary osteoarthritis of right knee 10/13/2018  . Chronic narcotic use 06/12/2016  . Encounter for chronic pain management 06/12/2016  . Bilateral sciatica 02/10/2016  . Spinal stenosis of lumbar region without neurogenic claudication 02/10/2016  . Gastroesophageal reflux disease without esophagitis 01/30/2016  . Chronic low back pain 10/12/2015  . Unstable angina ([place]) 05/04/2015  . CAD (coronary artery disease) 06/25/2014  . Chest pain 06/25/2014  . Paresthesia of both feet 03/11/2014  . Paresthesia of  skin 03/11/2014  . Health maintenance examination 11/12/2013  . Acute exacerbation of chronic obstructive pulmonary disease (COPD) ([place]) 08/13/2013  . Arthritis of knee, degenerative 04/10/2013  . H/O Spinal surgery 03/11/2013  . Degenerative disc disease, lumbar 03/11/2013  . Degenerative arthritis of right knee 03/11/2013  . Anxiety state 03/03/2013  . COPD (chronic obstructive pulmonary disease) ([place]) 03/03/2013  . Generalized anxiety disorder 03/03/2013  . Osteoarthritis of knee 06/30/2012  . Right knee pain 06/26/2012  . Colon cancer screening 02/21/2012  . Breast cancer screening 02/21/2012  . Prediabetes 11/09/2010  . Hyperlipidemia 11/09/2010  . TOBACCO ABUSE 11/09/2010  . BACK PAIN, LUMBAR, CHRONIC 11/09/2010  . HEMATURIA, MICROSCOPIC, HX OF 11/09/2010       Geraldine Solar PT, DPT  [place] [address] [place], [place], [address] Phone: [phone]   Fax:  [phone]  Name: Brenda [name] MRN: [id number] Date of Birth: 09/17/1956

## 2018-11-14 ENCOUNTER — Encounter (HOSPITAL_COMMUNITY): Payer: Self-pay | Admitting: Physical Therapy

## 2018-11-14 ENCOUNTER — Ambulatory Visit (HOSPITAL_COMMUNITY): Payer: Medicare Other | Admitting: Physical Therapy

## 2018-11-14 DIAGNOSIS — M25661 Stiffness of right knee, not elsewhere classified: Secondary | ICD-10-CM

## 2018-11-14 DIAGNOSIS — M25561 Pain in right knee: Secondary | ICD-10-CM

## 2018-11-14 DIAGNOSIS — R600 Localized edema: Secondary | ICD-10-CM

## 2018-11-14 DIAGNOSIS — R262 Difficulty in walking, not elsewhere classified: Secondary | ICD-10-CM

## 2018-11-14 NOTE — Therapy (Signed)
[place], [place], [address] Phone: [phone]   Fax:  [phone]  Physical Therapy Treatment  Patient Details  Name: Brenda [name] MRN: [id number] Date of Birth: 04/15/1956 Referring Provider (PT): Edmonia Lynch, MD   Encounter Date: 11/14/2018  PT End of Session - 11/14/18 1705    Visit Number  3    Number of Visits  19    Date for PT Re-Evaluation  12/22/18   mini reassess 12/01/18   Authorization Type  Medicare    Authorization Time Period  11/10/18 top 12/22/18    Authorization - Visit Number  3    Authorization - Number of Visits  10    PT Start Time  8938    PT Stop Time  1725    PT Time Calculation (min)  38 min    Activity Tolerance  Patient limited by pain    Behavior During Therapy  [facility] for tasks assessed/performed       Past Medical History:  Diagnosis Date  . Aortic valve stenosis    "very mild"  . Atypical chest pain 2007; 2015   cardiolyte neg, echo nl, cath showed mild/nonobstructive LAD disease  . Bilateral primary osteoarthritis of knee 06/30/2012   Right >L.  Plan for medial compartment knee arthroplasty as of 09/2018.  Marland Kitchen CAD (coronary artery disease)   . Chronic LBP    Multiple surgeries  . COPD (chronic obstructive pulmonary disease) ([place])   . DDD (degenerative disc disease)    spinal stenosis  . Fatty liver   . History of GI bleed    NSAIDS  . Hyperlipidemia    mixed  . Microscopic hematuria    full w/ u unrevealing X 2  . Normal nuclear stress test 11/11 and 06/2014   negative, EF normal  . Palpitations 2006   48H holter neg  . PONV (postoperative nausea and vomiting)    "never threw up but felt sick on my stomach", also believes that she was aware of surgery during her hysterectomy   . Pre-diabetes   . Prediabetes    Highest A1c 6.1% as of 03/2017  . RBBB (right bundle branch block)   . Recurrent UTI   . Tobacco dependence in remission    Quit fall 2015.  Only  occasional bronchitis illness (CT shest 2009 nl- for f/u of ? nodule on CXR)    Past Surgical History:  Procedure Laterality Date  . ABDOMINAL HYSTERECTOMY  1997   DUB  . APPENDECTOMY  1984  . CARDIAC CATHETERIZATION  10/09/2005   no CAD, mildly elevated LVEDP, normal LV function (Dr. Gerrie Nordmann)  . CARDIAC CATHETERIZATION N/A 05/16/2015   Mild non-obstructive CAD--med mgmt.  Procedure: Left Heart Cath and Coronary Angiography;  Surgeon: Pixie Casino, MD;  Location: [facility];  Service: Cardiovascular;  Laterality: N/A;  . CARDIOVASCULAR STRESS TEST  06/2014   normal lexiscan NST  . CARDIOVASCULAR STRESS TEST  2006   persantine - no ischemia, low risk   . KNEE ARTHROSCOPY Bilateral   . left wrist ganglion cyst excision  2010  . [place]   right iliac crest bone graft+metal instrumentation; 2005 metal removal and decompression, 2011 lumbar decompression 4-11, then stabilization/ instrumentation done 09-19-10: L2,L3, L5 left and L2 , L3, L4 right pedical remnant L4 left embedded. Left iliac crest bone graft-- Dr Velna Ochs  . LUMBAR SPINE SURGERY  02/10/2016   Dr. Velna Ochs: lumbar  decompression, instrumentation removal, and fusion exploration--HELPED her a lot, esp her radicular leg pains.  . OOPHORECTOMY Right    cyst  . PARTIAL KNEE ARTHROPLASTY Right 11/04/2018   Procedure: UNICOMPARTMENTAL KNEE;  Surgeon: Renette Butters, MD;  Location: [facility];  Service: Orthopedics;  Laterality: Right;  Adductor Block  . TONSILLECTOMY  63 yrs old  . TONSILLECTOMY    . TRANSTHORACIC ECHOCARDIOGRAM  2006   EF=>55%, trace MR, mild TR, trace AV regurg, trace pulm valve regurg     There were no vitals filed for this visit.  Subjective Assessment - 11/14/18 1650    Subjective  Patient reported she is doing okay today. She rated her pain as a 3/10 today.     Limitations  Standing;Walking;House hold activities;Sitting    How long can you sit comfortably?  5 mins    How long can  you stand comfortably?  maybe 10 mins    How long can you walk comfortably?  <5 mins    Patient Stated Goals  get back to where she can move her knee independently     Currently in Pain?  Yes    Pain Score  3     Pain Location  Knee    Pain Orientation  Right    Pain Descriptors / Indicators  Aching    Pain Type  Surgical pain    Pain Onset  1 to 4 weeks ago         [facility] PT Assessment - 11/14/18 0001      Observation/Other Assessments   Observations  No excessive redness noted around bandaging                   [facility] Adult PT Treatment/Exercise - 11/14/18 0001      Ambulation/Gait   Ambulation/Gait  Yes    Ambulation Distance (Feet)  226 Feet    Assistive device  Rolling walker    Gait Pattern  Step-through pattern;Decreased stance time - right;Decreased dorsiflexion - right;Decreased hip/knee flexion - right;Trendelenburg;Antalgic    Gait Comments  Therapist cues to to perform gait with heel-toe pattern and encouraged step through gait      Exercises   Exercises  Knee/Hip      Knee/Hip Exercises: Stretches   Passive Hamstring Stretch  Right;3 reps;30 seconds    Passive Hamstring Stretch Limitations  Seated with leg propped on 6'' step       Knee/Hip Exercises: Seated   Heel Slides  AROM;Right;1 set;15 reps      Knee/Hip Exercises: Supine   Quad Sets  Right;10 reps    Short Arc Quad Sets  Right;15 reps    Short Arc Quad Sets Limitations  Knee over bolster    Heel Slides  Right;10 reps    Knee Extension Limitations  10    Knee Flexion Limitations  81      Manual Therapy   Manual Therapy  Edema management    Manual therapy comments  separate rest of treatment    Edema Management  retro massage with BLE elevated for edema and pain management             PT Education - 11/14/18 1655    Education Details  Educated patient on purpose and technique of exercises    Person(s) Educated  Patient    Methods  Explanation    Comprehension  Verbalized  understanding       PT Short Term Goals - 11/10/18 1102  PT SHORT TERM GOAL #1   Title  Pt will have reduced edema by 2cm or > at joint line in order to reduce pain and improve ROM.     Time  3    Period  Weeks    Status  New    Target Date  12/01/18      PT SHORT TERM GOAL #2   Title  Pt will have improved R knee AROM from 8-90deg in order to reduce pain and improve sitting tolerance.     Time  3    Period  Weeks    Status  New      PT SHORT TERM GOAL #3   Title  Pt will have improved MMT to 4/5 throughout all mm groups in order to maximize balance, gait, and return to PLOF.    Time  3    Period  Weeks    Status  New      PT SHORT TERM GOAL #4   Title  Pt will be able to perform 5xSTS in 15sec or < without UE and with proper form to demo improved functional strength and balance    Time  3    Period  Weeks    Status  New        PT Long Term Goals - 11/10/18 1102      PT LONG TERM GOAL #1   Title  Pt will have improved R knee AROM from 0-115deg or better in order to further reduce pain, maximize gait, and stair ambulation.     Time  6    Period  Weeks    Status  New    Target Date  12/22/18      PT LONG TERM GOAL #2   Title  Pt will have improved MMT to 4+/5 throughout all mm groups in order to further maximize balance, gait, and tolerance to functional activities.     Time  6    Period  Weeks    Status  New      PT LONG TERM GOAL #3   Title  Pt will be able to perform bil SLS for 15sec or > without UE support to demo improved balance and functional strength to allow her to navigate stairs and ambulate on uneven ground with greater ease.     Time  6    Period  Weeks    Status  New      PT LONG TERM GOAL #4   Title  Pt will be able to ambulate 3105ft or > during 3MWT without AD and gait WFL in order to demo improved functional mobility and maximize return to PLOF.     Time  6    Period  Weeks    Status  New            Plan - 11/14/18 1735     Clinical Impression Statement  This session continued with established plan of care. This session focused on ROM and reducing edema in patient's knee. This session added seated heel slides. Also, performed some ambulation training by providing verbal cues for improved gait mechanics with ambulation. Patient's knee AROM improved this session to 10-81 degrees. Plan to continue focus on ROM in upcoming session and progression to standing exercises as tolerated.     Rehab Potential  Good    PT Frequency  3x / week    PT Duration  6 weeks    PT Treatment/Interventions  ADLs/Self Care Home Management;Aquatic  Therapy;Cryotherapy;Electrical Stimulation;DME Instruction;Gait training;Stair training;Functional mobility training;Therapeutic activities;Therapeutic exercise;Balance training;Neuromuscular re-education;Patient/family education;Manual techniques;Manual lymph drainage;Scar mobilization;Passive range of motion;Dry needling;Energy conservation;Taping    PT Next Visit Plan  cotninue to address edema and ROM initially prior to heavy strengthening    PT Home Exercise Plan  eval: supine heel slide, quad set, supine HS stretch with rope    Consulted and Agree with Plan of Care  Patient       Patient will benefit from skilled therapeutic intervention in order to improve the following deficits and impairments:  Abnormal gait, Decreased activity tolerance, Decreased balance, Decreased mobility, Decreased range of motion, Decreased scar mobility, Decreased strength, Difficulty walking, Hypomobility, Increased edema, Increased fascial restricitons, Increased muscle spasms, Impaired flexibility, Improper body mechanics, Postural dysfunction, Pain  Visit Diagnosis: Acute pain of right knee  Stiffness of right knee, not elsewhere classified  Localized edema  Difficulty in walking, not elsewhere classified     Problem List Patient Active Problem List   Diagnosis Date Noted  . Primary osteoarthritis of  right knee 10/13/2018  . Chronic narcotic use 06/12/2016  . Encounter for chronic pain management 06/12/2016  . Bilateral sciatica 02/10/2016  . Spinal stenosis of lumbar region without neurogenic claudication 02/10/2016  . Gastroesophageal reflux disease without esophagitis 01/30/2016  . Chronic low back pain 10/12/2015  . Unstable angina ([place]) 05/04/2015  . CAD (coronary artery disease) 06/25/2014  . Chest pain 06/25/2014  . Paresthesia of both feet 03/11/2014  . Paresthesia of skin 03/11/2014  . Health maintenance examination 11/12/2013  . Acute exacerbation of chronic obstructive pulmonary disease (COPD) ([place]) 08/13/2013  . Arthritis of knee, degenerative 04/10/2013  . H/O Spinal surgery 03/11/2013  . Degenerative disc disease, lumbar 03/11/2013  . Degenerative arthritis of right knee 03/11/2013  . Anxiety state 03/03/2013  . COPD (chronic obstructive pulmonary disease) ([place]) 03/03/2013  . Generalized anxiety disorder 03/03/2013  . Osteoarthritis of knee 06/30/2012  . Right knee pain 06/26/2012  . Colon cancer screening 02/21/2012  . Breast cancer screening 02/21/2012  . Prediabetes 11/09/2010  . Hyperlipidemia 11/09/2010  . TOBACCO ABUSE 11/09/2010  . BACK PAIN, LUMBAR, CHRONIC 11/09/2010  . HEMATURIA, MICROSCOPIC, HX OF 11/09/2010   Clarene Critchley PT, DPT 5:39 PM, 11/14/18 [place], [place], [address] Phone: [phone]   Fax:  [phone]  Name: BRIONNE MERTZ MRN: [id number] Date of Birth: [birth year]

## 2018-11-16 MED FILL — Bupivacaine Liposome Inj 1.3% (13.3 MG/ML): INTRAMUSCULAR | Qty: 20 | Status: AC

## 2018-11-17 ENCOUNTER — Ambulatory Visit (HOSPITAL_COMMUNITY): Payer: Medicare Other

## 2018-11-17 ENCOUNTER — Encounter (HOSPITAL_COMMUNITY): Payer: Self-pay

## 2018-11-17 DIAGNOSIS — M25561 Pain in right knee: Secondary | ICD-10-CM

## 2018-11-17 DIAGNOSIS — M25661 Stiffness of right knee, not elsewhere classified: Secondary | ICD-10-CM

## 2018-11-17 DIAGNOSIS — R600 Localized edema: Secondary | ICD-10-CM

## 2018-11-17 DIAGNOSIS — R262 Difficulty in walking, not elsewhere classified: Secondary | ICD-10-CM

## 2018-11-17 NOTE — Therapy (Signed)
[place], [place], [address] Phone: [phone]   Fax:  [phone]  Physical Therapy Treatment  Patient Details  Name: Brenda [name] MRN: [id number] Date of Birth: 11-Sep-1956 Referring Provider (PT): Edmonia Lynch, MD   Encounter Date: 11/17/2018  PT End of Session - 11/17/18 1432    Visit Number  4    Number of Visits  19    Date for PT Re-Evaluation  12/22/18   mini reassess 12/01/18   Authorization Type  Medicare    Authorization Time Period  11/10/18 top 12/22/18    Authorization - Visit Number  4    Authorization - Number of Visits  10    PT Start Time  3532    PT Stop Time  1514    PT Time Calculation (min)  43 min    Activity Tolerance  Patient limited by pain    Behavior During Therapy  [facility] for tasks assessed/performed       Past Medical History:  Diagnosis Date  . Aortic valve stenosis    "very mild"  . Atypical chest pain 2007; 2015   cardiolyte neg, echo nl, cath showed mild/nonobstructive LAD disease  . Bilateral primary osteoarthritis of knee 06/30/2012   Right >L.  Plan for medial compartment knee arthroplasty as of 09/2018.  Marland Kitchen CAD (coronary artery disease)   . Chronic LBP    Multiple surgeries  . COPD (chronic obstructive pulmonary disease) ([place])   . DDD (degenerative disc disease)    spinal stenosis  . Fatty liver   . History of GI bleed    NSAIDS  . Hyperlipidemia    mixed  . Microscopic hematuria    full w/ u unrevealing X 2  . Normal nuclear stress test 11/11 and 06/2014   negative, EF normal  . Palpitations 2006   48H holter neg  . PONV (postoperative nausea and vomiting)    "never threw up but felt sick on my stomach", also believes that she was aware of surgery during her hysterectomy   . Pre-diabetes   . Prediabetes    Highest A1c 6.1% as of 03/2017  . RBBB (right bundle branch block)   . Recurrent UTI   . Tobacco dependence in remission    Quit fall 2015.  Only  occasional bronchitis illness (CT shest 2009 nl- for f/u of ? nodule on CXR)    Past Surgical History:  Procedure Laterality Date  . ABDOMINAL HYSTERECTOMY  1997   DUB  . APPENDECTOMY  1984  . CARDIAC CATHETERIZATION  10/09/2005   no CAD, mildly elevated LVEDP, normal LV function (Dr. Gerrie Nordmann)  . CARDIAC CATHETERIZATION N/A 05/16/2015   Mild non-obstructive CAD--med mgmt.  Procedure: Left Heart Cath and Coronary Angiography;  Surgeon: Pixie Casino, MD;  Location: [employer] [facility];  Service: Cardiovascular;  Laterality: N/A;  . CARDIOVASCULAR STRESS TEST  06/2014   normal lexiscan NST  . CARDIOVASCULAR STRESS TEST  2006   persantine - no ischemia, low risk   . KNEE ARTHROSCOPY Bilateral   . left wrist ganglion cyst excision  2010  . [place]   right iliac crest bone graft+metal instrumentation; 2005 metal removal and decompression, 2011 lumbar decompression 4-11, then stabilization/ instrumentation done 09-19-10: L2,L3, L5 left and L2 , L3, L4 right pedical remnant L4 left embedded. Left iliac crest bone graft-- Dr Velna Ochs  . LUMBAR SPINE SURGERY  02/10/2016   Dr. Velna Ochs: lumbar  decompression, instrumentation removal, and fusion exploration--HELPED her a lot, esp her radicular leg pains.  . OOPHORECTOMY Right    cyst  . PARTIAL KNEE ARTHROPLASTY Right 11/04/2018   Procedure: UNICOMPARTMENTAL KNEE;  Surgeon: Renette Butters, MD;  Location: [facility];  Service: Orthopedics;  Laterality: Right;  Adductor Block  . TONSILLECTOMY  63 yrs old  . TONSILLECTOMY    . TRANSTHORACIC ECHOCARDIOGRAM  2006   EF=>55%, trace MR, mild TR, trace AV regurg, trace pulm valve regurg     There were no vitals filed for this visit.  Subjective Assessment - 11/17/18 1432    Subjective  Pt states that she has had a rough day today with her pain. She states her knee tends to hurt worst at night.     Limitations  Standing;Walking;House hold activities;Sitting    How long can you sit  comfortably?  5 mins    How long can you stand comfortably?  maybe 10 mins    How long can you walk comfortably?  <5 mins    Patient Stated Goals  get back to where she can move her knee independently     Currently in Pain?  Yes    Pain Score  4     Pain Location  Knee    Pain Orientation  Right    Pain Descriptors / Indicators  Aching    Pain Type  Surgical pain    Pain Onset  1 to 4 weeks ago    Pain Frequency  Constant    Aggravating Factors   moving, WB    Pain Relieving Factors  ice pack, CPM    Effect of Pain on Daily Activities  increases          [facility] Adult PT Treatment/Exercise - 11/17/18 0001      Knee/Hip Exercises: Stretches   Passive Hamstring Stretch  Right;3 reps;30 seconds    Passive Hamstring Stretch Limitations  supine with rope    Gastroc Stretch  Both;3 reps;30 seconds    Gastroc Stretch Limitations  slant board      Knee/Hip Exercises: Aerobic   Stationary Bike  x3 mins, seat 11, 1/2 revolutions for ROM      Knee/Hip Exercises: Standing   Rocker Board  2 minutes    Rocker Board Limitations  R/L    Gait Training  x146ft wth RW focusing on heel to toe gait and step-through gait      Knee/Hip Exercises: Seated   Long Arc Quad  Right;10 reps    Heel Slides  Right;15 reps      Knee/Hip Exercises: Supine   Quad Sets  Right;20 reps    Short Arc [employer]  Right;15 reps    Short Arc Quad Sets Limitations  bolster    Heel Slides  Right;10 reps    Knee Extension Limitations  7    Knee Flexion Limitations  83      Manual Therapy   Manual Therapy  Edema management;Joint mobilization    Manual therapy comments  separate rest of treatment    Edema Management  retro massage with BLE elevated for edema and pain management    Joint Mobilization  patellar mobs all directions for mobility           PT Education - 11/17/18 1432    Education Details  exercise technique, continue HEP    Person(s) Educated  Patient    Methods  Explanation;Demonstration     Comprehension  Verbalized understanding;Returned demonstration  PT Short Term Goals - 11/10/18 1102      PT SHORT TERM GOAL #1   Title  Pt will have reduced edema by 2cm or > at joint line in order to reduce pain and improve ROM.     Time  3    Period  Weeks    Status  New    Target Date  12/01/18      PT SHORT TERM GOAL #2   Title  Pt will have improved R knee AROM from 8-90deg in order to reduce pain and improve sitting tolerance.     Time  3    Period  Weeks    Status  New      PT SHORT TERM GOAL #3   Title  Pt will have improved MMT to 4/5 throughout all mm groups in order to maximize balance, gait, and return to PLOF.    Time  3    Period  Weeks    Status  New      PT SHORT TERM GOAL #4   Title  Pt will be able to perform 5xSTS in 15sec or < without UE and with proper form to demo improved functional strength and balance    Time  3    Period  Weeks    Status  New        PT Long Term Goals - 11/10/18 1102      PT LONG TERM GOAL #1   Title  Pt will have improved R knee AROM from 0-115deg or better in order to further reduce pain, maximize gait, and stair ambulation.     Time  6    Period  Weeks    Status  New    Target Date  12/22/18      PT LONG TERM GOAL #2   Title  Pt will have improved MMT to 4+/5 throughout all mm groups in order to further maximize balance, gait, and tolerance to functional activities.     Time  6    Period  Weeks    Status  New      PT LONG TERM GOAL #3   Title  Pt will be able to perform bil SLS for 15sec or > without UE support to demo improved balance and functional strength to allow her to navigate stairs and ambulate on uneven ground with greater ease.     Time  6    Period  Weeks    Status  New      PT LONG TERM GOAL #4   Title  Pt will be able to ambulate 349ft or > during 3MWT without AD and gait WFL in order to demo improved functional mobility and maximize return to PLOF.     Time  6    Period  Weeks    Status   New            Plan - 11/17/18 1516    Clinical Impression Statement  Began session with gait with RW and focused on heel to toe and step-through gait. Initiated ROM work on the bike today; pt able to perform 1/2 revolutions. Also initiated light mobility work in standing today with good tolerance. Continued with table ROM work and ended with manual for edema and patellar mobility. AROM 7 to 83deg. Continue as planned, progressing as able. Pt sees MD on Wednesday after her therapy session so a note with updated ROM measurements will be sent to him.  Rehab Potential  Good    PT Frequency  3x / week    PT Duration  6 weeks    PT Treatment/Interventions  ADLs/Self Care Home Management;Aquatic Therapy;Cryotherapy;Electrical Stimulation;DME Instruction;Gait training;Stair training;Functional mobility training;Therapeutic activities;Therapeutic exercise;Balance training;Neuromuscular re-education;Patient/family education;Manual techniques;Manual lymph drainage;Scar mobilization;Passive range of motion;Dry needling;Energy conservation;Taping    PT Next Visit Plan  cotninue to address edema and ROM initially prior to heavy strengthening    PT Home Exercise Plan  eval: supine heel slide, quad set, supine HS stretch with rope    Consulted and Agree with Plan of Care  Patient       Patient will benefit from skilled therapeutic intervention in order to improve the following deficits and impairments:  Abnormal gait, Decreased activity tolerance, Decreased balance, Decreased mobility, Decreased range of motion, Decreased scar mobility, Decreased strength, Difficulty walking, Hypomobility, Increased edema, Increased fascial restricitons, Increased muscle spasms, Impaired flexibility, Improper body mechanics, Postural dysfunction, Pain  Visit Diagnosis: Acute pain of right knee  Stiffness of right knee, not elsewhere classified  Localized edema  Difficulty in walking, not elsewhere  classified     Problem List Patient Active Problem List   Diagnosis Date Noted  . Primary osteoarthritis of right knee 10/13/2018  . Chronic narcotic use 06/12/2016  . Encounter for chronic pain management 06/12/2016  . Bilateral sciatica 02/10/2016  . Spinal stenosis of lumbar region without neurogenic claudication 02/10/2016  . Gastroesophageal reflux disease without esophagitis 01/30/2016  . Chronic low back pain 10/12/2015  . Unstable angina ([place]) 05/04/2015  . CAD (coronary artery disease) 06/25/2014  . Chest pain 06/25/2014  . Paresthesia of both feet 03/11/2014  . Paresthesia of skin 03/11/2014  . Health maintenance examination 11/12/2013  . Acute exacerbation of chronic obstructive pulmonary disease (COPD) ([place]) 08/13/2013  . Arthritis of knee, degenerative 04/10/2013  . H/O Spinal surgery 03/11/2013  . Degenerative disc disease, lumbar 03/11/2013  . Degenerative arthritis of right knee 03/11/2013  . Anxiety state 03/03/2013  . COPD (chronic obstructive pulmonary disease) ([place]) 03/03/2013  . Generalized anxiety disorder 03/03/2013  . Osteoarthritis of knee 06/30/2012  . Right knee pain 06/26/2012  . Colon cancer screening 02/21/2012  . Breast cancer screening 02/21/2012  . Prediabetes 11/09/2010  . Hyperlipidemia 11/09/2010  . TOBACCO ABUSE 11/09/2010  . BACK PAIN, LUMBAR, CHRONIC 11/09/2010  . HEMATURIA, MICROSCOPIC, HX OF 11/09/2010       Geraldine Solar PT, DPT  [place] [facility] [address] [place], [place], [address] Phone: [phone]   Fax:  [phone]  Name: Brenda [name] MRN: [id number] Date of Birth: Feb 24, 1956

## 2018-11-19 ENCOUNTER — Encounter (HOSPITAL_COMMUNITY): Payer: Self-pay

## 2018-11-19 ENCOUNTER — Ambulatory Visit (HOSPITAL_COMMUNITY): Payer: Medicare Other

## 2018-11-19 DIAGNOSIS — M25661 Stiffness of right knee, not elsewhere classified: Secondary | ICD-10-CM

## 2018-11-19 DIAGNOSIS — M25561 Pain in right knee: Secondary | ICD-10-CM | POA: Diagnosis not present

## 2018-11-19 DIAGNOSIS — R600 Localized edema: Secondary | ICD-10-CM

## 2018-11-19 DIAGNOSIS — M1711 Unilateral primary osteoarthritis, right knee: Secondary | ICD-10-CM | POA: Diagnosis not present

## 2018-11-19 DIAGNOSIS — R262 Difficulty in walking, not elsewhere classified: Secondary | ICD-10-CM | POA: Diagnosis not present

## 2018-11-19 NOTE — Therapy (Signed)
[place], [place], [address] Phone: [phone]   Fax:  [phone]  Physical Therapy Treatment  Patient Details  Name: Brenda [name] MRN: [id number] Date of Birth: 1956-03-09 Referring Provider (PT): Edmonia Lynch, MD # OF FEET WALKED: 494 ft with RW during 3MWT ROM:  Flexion: 74 degrees            Extension: 4 degrees   Encounter Date: 11/19/2018  PT End of Session - 11/19/18 1134    Visit Number  5    Number of Visits  19    Date for PT Re-Evaluation  12/22/18   Minireassess 12/01/18   Authorization Type  Medicare    Authorization Time Period  11/10/18 top 12/22/18    Authorization - Visit Number  5    Authorization - Number of Visits  10    PT Start Time  1128   Therapist late for session   PT Stop Time  1212   3' on bike, not included with charges   PT Time Calculation (min)  44 min    Activity Tolerance  Patient limited by pain;Patient tolerated treatment well    Behavior During Therapy  [facility] for tasks assessed/performed       Past Medical History:  Diagnosis Date  . Aortic valve stenosis    "very mild"  . Atypical chest pain 2007; 2015   cardiolyte neg, echo nl, cath showed mild/nonobstructive LAD disease  . Bilateral primary osteoarthritis of knee 06/30/2012   Right >L.  Plan for medial compartment knee arthroplasty as of 09/2018.  Marland Kitchen CAD (coronary artery disease)   . Chronic LBP    Multiple surgeries  . COPD (chronic obstructive pulmonary disease) ([place])   . DDD (degenerative disc disease)    spinal stenosis  . Fatty liver   . History of GI bleed    NSAIDS  . Hyperlipidemia    mixed  . Microscopic hematuria    full w/ u unrevealing X 2  . Normal nuclear stress test 11/11 and 06/2014   negative, EF normal  . Palpitations 2006   48H holter neg  . PONV (postoperative nausea and vomiting)    "never threw up but felt sick on my stomach", also believes that she was aware of surgery during  her hysterectomy   . Pre-diabetes   . Prediabetes    Highest A1c 6.1% as of 03/2017  . RBBB (right bundle branch block)   . Recurrent UTI   . Tobacco dependence in remission    Quit fall 2015.  Only occasional bronchitis illness (CT shest 2009 nl- for f/u of ? nodule on CXR)    Past Surgical History:  Procedure Laterality Date  . ABDOMINAL HYSTERECTOMY  1997   DUB  . APPENDECTOMY  1984  . CARDIAC CATHETERIZATION  10/09/2005   no CAD, mildly elevated LVEDP, normal LV function (Dr. Gerrie Nordmann)  . CARDIAC CATHETERIZATION N/A 05/16/2015   Mild non-obstructive CAD--med mgmt.  Procedure: Left Heart Cath and Coronary Angiography;  Surgeon: Pixie Casino, MD;  Location: [place] [facility];  Service: Cardiovascular;  Laterality: N/A;  . CARDIOVASCULAR STRESS TEST  06/2014   normal lexiscan NST  . CARDIOVASCULAR STRESS TEST  2006   persantine - no ischemia, low risk   . KNEE ARTHROSCOPY Bilateral   . left wrist ganglion cyst excision  2010  . [place]   right iliac crest bone graft+metal instrumentation; 2005 metal removal  and decompression, 2011 lumbar decompression 4-11, then stabilization/ instrumentation done 09-19-10: L2,L3, L5 left and L2 , L3, L4 right pedical remnant L4 left embedded. Left iliac crest bone graft-- Dr Velna Ochs  . LUMBAR SPINE SURGERY  02/10/2016   Dr. Velna Ochs: lumbar decompression, instrumentation removal, and fusion exploration--HELPED her a lot, esp her radicular leg pains.  . OOPHORECTOMY Right    cyst  . PARTIAL KNEE ARTHROPLASTY Right 11/04/2018   Procedure: UNICOMPARTMENTAL KNEE;  Surgeon: Renette Butters, MD;  Location: [facility];  Service: Orthopedics;  Laterality: Right;  Adductor Block  . TONSILLECTOMY  63 yrs old  . TONSILLECTOMY    . TRANSTHORACIC ECHOCARDIOGRAM  2006   EF=>55%, trace MR, mild TR, trace AV regurg, trace pulm valve regurg     There were no vitals filed for this visit.  Subjective Assessment - 11/19/18 1132    Subjective  Pt  reports she had increased pain following last session, reports unable to complete HEP due to pain.  Current pain scale 4.5/10 for Rt knee.      Patient Stated Goals  get back to where she can move her knee independently     Currently in Pain?  Yes    Pain Score  5     Pain Location  Knee    Pain Orientation  Right    Pain Descriptors / Indicators  Sore;Aching;Tightness;Dull    Pain Type  Surgical pain    Pain Onset  1 to 4 weeks ago    Pain Frequency  Constant    Aggravating Factors   moving, WB    Pain Relieving Factors  ice pack, CPM    Effect of Pain on Daily Activities  increases         [facility] PT Assessment - 11/19/18 0001      Assessment   Medical Diagnosis  s/p partial R knee replacement    Referring Provider (PT)  Edmonia Lynch, MD    Onset Date/Surgical Date  11/04/18    Next MD Visit  11/18/18    Prior Therapy  yes for LBP      Circumferential Edema   Circumferential - Right  39.25 cm, joint line   was 40 cm, joint line     AROM   Right Knee Extension  4    Right Knee Flexion  74      Ambulation/Gait   Ambulation/Gait  Yes    Ambulation Distance (Feet)  494 Feet    Assistive device  Rolling walker    Gait Pattern  Step-through pattern;Decreased stance time - right;Decreased dorsiflexion - right;Decreased hip/knee flexion - right;Trendelenburg;Antalgic    Gait Comments  Good heel to toe mechanics with knee mobility      Standardized Balance Assessment   Five times sit to stand comments   18.93". no UE A, RLE extended   23.67sec, BUE, RLE extended                  [facility] Adult PT Treatment/Exercise - 11/19/18 0001      Knee/Hip Exercises: Stretches   Passive Hamstring Stretch  Right;3 reps;30 seconds    Passive Hamstring Stretch Limitations  supine with rope      Knee/Hip Exercises: Aerobic   Stationary Bike  x3 mins, seat 11, 1/2 revolutions for ROM      Knee/Hip Exercises: Seated   Heel Slides  5 reps      Knee/Hip Exercises: Supine    Quad Sets  Right;20 reps  Short Arc [employer] Limitations  --   bolster   Heel Slides  Right;10 reps    Knee Extension  AROM    Knee Extension Limitations  4   was 7   Knee Flexion  AROM    Knee Flexion Limitations  74   limited by pain was 83     Manual Therapy   Manual Therapy  Edema management;Joint mobilization    Manual therapy comments  separate rest of treatment    Edema Management  retro massage with BLE elevated for edema and pain management    Joint Mobilization  patellar mobs all directions for mobility               PT Short Term Goals - 11/10/18 1102      PT SHORT TERM GOAL #1   Title  Pt will have reduced edema by 2cm or > at joint line in order to reduce pain and improve ROM.     Time  3    Period  Weeks    Status  New    Target Date  12/01/18      PT SHORT TERM GOAL #2   Title  Pt will have improved R knee AROM from 8-90deg in order to reduce pain and improve sitting tolerance.     Time  3    Period  Weeks    Status  New      PT SHORT TERM GOAL #3   Title  Pt will have improved MMT to 4/5 throughout all mm groups in order to maximize balance, gait, and return to PLOF.    Time  3    Period  Weeks    Status  New      PT SHORT TERM GOAL #4   Title  Pt will be able to perform 5xSTS in 15sec or < without UE and with proper form to demo improved functional strength and balance    Time  3    Period  Weeks    Status  New        PT Long Term Goals - 11/10/18 1102      PT LONG TERM GOAL #1   Title  Pt will have improved R knee AROM from 0-115deg or better in order to further reduce pain, maximize gait, and stair ambulation.     Time  6    Period  Weeks    Status  New    Target Date  12/22/18      PT LONG TERM GOAL #2   Title  Pt will have improved MMT to 4+/5 throughout all mm groups in order to further maximize balance, gait, and tolerance to functional activities.     Time  6    Period  Weeks     Status  New      PT LONG TERM GOAL #3   Title  Pt will be able to perform bil SLS for 15sec or > without UE support to demo improved balance and functional strength to allow her to navigate stairs and ambulate on uneven ground with greater ease.     Time  6    Period  Weeks    Status  New      PT LONG TERM GOAL #4   Title  Pt will be able to ambulate 351ft or > during 3MWT without AD and gait WFL in order to demo improved functional mobility  and maximize return to PLOF.     Time  6    Period  Weeks    Status  New            Plan - 11/19/18 1217    Clinical Impression Statement  Pt arrived with reports of increased pain and reduction in frequency/compliance with HEP.  Session foucs on knee mobility and manual technqiues to assist wiht pain.  Began session with bike for mobility, 1/2 revolutions and ROM based therex.  EOS with retro grade massage for edema control, especially in medial aspect of knee.  Reviewed STGs prior MD apt later today with decrease in edema by 3/4 cm, AROM 4-74 degrees (was 7-83 last session) and 3MWT complete in 494 ft with RW.  Pt demonstrated good gait mechanics.  Reviewed benefits of completeing HEP for mobility and pain control wiht verbalized understanding.  EOS pt reports pain reduced to 3/10 following gait and manual.      Rehab Potential  Good    PT Frequency  3x / week    PT Duration  6 weeks    PT Treatment/Interventions  ADLs/Self Care Home Management;Aquatic Therapy;Cryotherapy;Electrical Stimulation;DME Instruction;Gait training;Stair training;Functional mobility training;Therapeutic activities;Therapeutic exercise;Balance training;Neuromuscular re-education;Patient/family education;Manual techniques;Manual lymph drainage;Scar mobilization;Passive range of motion;Dry needling;Energy conservation;Taping    PT Next Visit Plan  F/U with MD apt.  Cotninue to address edema and ROM initially prior to heavy strengthening    PT Home Exercise Plan  eval: supine  heel slide, quad set, supine HS stretch with rope       Patient will benefit from skilled therapeutic intervention in order to improve the following deficits and impairments:  Abnormal gait, Decreased activity tolerance, Decreased balance, Decreased mobility, Decreased range of motion, Decreased scar mobility, Decreased strength, Difficulty walking, Hypomobility, Increased edema, Increased fascial restricitons, Increased muscle spasms, Impaired flexibility, Improper body mechanics, Postural dysfunction, Pain  Visit Diagnosis: Acute pain of right knee  Stiffness of right knee, not elsewhere classified  Localized edema  Difficulty in walking, not elsewhere classified     Problem List Patient Active Problem List   Diagnosis Date Noted  . Primary osteoarthritis of right knee 10/13/2018  . Chronic narcotic use 06/12/2016  . Encounter for chronic pain management 06/12/2016  . Bilateral sciatica 02/10/2016  . Spinal stenosis of lumbar region without neurogenic claudication 02/10/2016  . Gastroesophageal reflux disease without esophagitis 01/30/2016  . Chronic low back pain 10/12/2015  . Unstable angina ([place]) 05/04/2015  . CAD (coronary artery disease) 06/25/2014  . Chest pain 06/25/2014  . Paresthesia of both feet 03/11/2014  . Paresthesia of skin 03/11/2014  . Health maintenance examination 11/12/2013  . Acute exacerbation of chronic obstructive pulmonary disease (COPD) ([place]) 08/13/2013  . Arthritis of knee, degenerative 04/10/2013  . H/O Spinal surgery 03/11/2013  . Degenerative disc disease, lumbar 03/11/2013  . Degenerative arthritis of right knee 03/11/2013  . Anxiety state 03/03/2013  . COPD (chronic obstructive pulmonary disease) ([place]) 03/03/2013  . Generalized anxiety disorder 03/03/2013  . Osteoarthritis of knee 06/30/2012  . Right knee pain 06/26/2012  . Colon cancer screening 02/21/2012  . Breast cancer screening 02/21/2012  . Prediabetes 11/09/2010  .  Hyperlipidemia 11/09/2010  . TOBACCO ABUSE 11/09/2010  . BACK PAIN, LUMBAR, CHRONIC 11/09/2010  . HEMATURIA, MICROSCOPIC, HX OF 11/09/2010   Ihor Austin, [place]; [place]  Aldona Lento 11/19/2018, 12:27 PM  [facility] [address] [place], [place], [address] Phone: [phone]   Fax:  [phone]  Name: Brenda [name] MRN: [id number] Date of Birth: 04/13/1956

## 2018-11-21 ENCOUNTER — Ambulatory Visit (HOSPITAL_COMMUNITY): Payer: Medicare Other | Admitting: Physical Therapy

## 2018-11-21 ENCOUNTER — Encounter (HOSPITAL_COMMUNITY): Payer: Self-pay | Admitting: Physical Therapy

## 2018-11-21 DIAGNOSIS — M25561 Pain in right knee: Secondary | ICD-10-CM

## 2018-11-21 DIAGNOSIS — M25661 Stiffness of right knee, not elsewhere classified: Secondary | ICD-10-CM

## 2018-11-21 DIAGNOSIS — R600 Localized edema: Secondary | ICD-10-CM | POA: Diagnosis not present

## 2018-11-21 DIAGNOSIS — R262 Difficulty in walking, not elsewhere classified: Secondary | ICD-10-CM | POA: Diagnosis not present

## 2018-11-21 NOTE — Therapy (Signed)
[place], [place], [address] Phone: [phone]   Fax:  [phone]  Physical Therapy Treatment  Patient Details  Name: Brenda [name] MRN: [id number] Date of Birth: 1956-01-03 Referring Provider (PT): Edmonia Lynch, MD   Encounter Date: 11/21/2018  PT End of Session - 11/21/18 1608    Visit Number  6    Number of Visits  19    Date for PT Re-Evaluation  12/22/18   Minireassess 12/01/18   Authorization Type  Medicare    Authorization Time Period  11/10/18 top 12/22/18    Authorization - Visit Number  6    Authorization - Number of Visits  10    PT Start Time  6803   Patient arrived late   PT Stop Time  1600    PT Time Calculation (min)  33 min    Activity Tolerance  Patient limited by pain;Patient tolerated treatment well    Behavior During Therapy  [facility] for tasks assessed/performed       Past Medical History:  Diagnosis Date  . Aortic valve stenosis    "very mild"  . Atypical chest pain 2007; 2015   cardiolyte neg, echo nl, cath showed mild/nonobstructive LAD disease  . Bilateral primary osteoarthritis of knee 06/30/2012   Right >L.  Plan for medial compartment knee arthroplasty as of 09/2018.  [place] CAD (coronary artery disease)   . Chronic LBP    Multiple surgeries  . COPD (chronic obstructive pulmonary disease) ([place])   . DDD (degenerative disc disease)    spinal stenosis  . Fatty liver   . History of GI bleed    NSAIDS  . Hyperlipidemia    mixed  . Microscopic hematuria    full w/ u unrevealing X 2  . Normal nuclear stress test 11/11 and 06/2014   negative, EF normal  . Palpitations 2006   48H holter neg  . PONV (postoperative nausea and vomiting)    "never threw up but felt sick on my stomach", also believes that she was aware of surgery during her hysterectomy   . Pre-diabetes   . Prediabetes    Highest A1c 6.1% as of 03/2017  . RBBB (right bundle branch block)   . Recurrent UTI   . Tobacco  dependence in remission    Quit fall 2015.  Only occasional bronchitis illness (CT shest 2009 nl- for f/u of ? nodule on CXR)    Past Surgical History:  Procedure Laterality Date  . ABDOMINAL HYSTERECTOMY  1997   DUB  . APPENDECTOMY  1984  . CARDIAC CATHETERIZATION  10/09/2005   no CAD, mildly elevated LVEDP, normal LV function (Dr. Gerrie Nordmann)  . CARDIAC CATHETERIZATION N/A 05/16/2015   Mild non-obstructive CAD--med mgmt.  Procedure: Left Heart Cath and Coronary Angiography;  Surgeon: Pixie Casino, MD;  Location: [facility];  Service: Cardiovascular;  Laterality: N/A;  . CARDIOVASCULAR STRESS TEST  06/2014   normal lexiscan NST  . CARDIOVASCULAR STRESS TEST  2006   persantine - no ischemia, low risk   . KNEE ARTHROSCOPY Bilateral   . left wrist ganglion cyst excision  2010  . [place]   right iliac crest bone graft+metal instrumentation; 2005 metal removal and decompression, 2011 lumbar decompression 4-11, then stabilization/ instrumentation done 09-19-10: L2,L3, L5 left and L2 , L3, L4 right pedical remnant L4 left embedded. Left iliac crest bone graft-- Dr Velna Ochs  . LUMBAR SPINE SURGERY  02/10/2016   Dr. Velna Ochs: lumbar decompression, instrumentation removal, and fusion exploration--HELPED her a lot, esp her radicular leg pains.  . OOPHORECTOMY Right    cyst  . PARTIAL KNEE ARTHROPLASTY Right 11/04/2018   Procedure: UNICOMPARTMENTAL KNEE;  Surgeon: Renette Butters, MD;  Location: [facility];  Service: Orthopedics;  Laterality: Right;  Adductor Block  . TONSILLECTOMY  63 yrs old  . TONSILLECTOMY    . TRANSTHORACIC ECHOCARDIOGRAM  2006   EF=>55%, trace MR, mild TR, trace AV regurg, trace pulm valve regurg     There were no vitals filed for this visit.  Subjective Assessment - 11/21/18 1531    Subjective  Patient reported that she saw her physician on Monday, and that he suspected potential infection. He gave her antibiotics which she has been taking. When  asked, she stated that he said she can continue with exercises, but just not to do anything that caused it to hurt a lot. She reported she does not have a fever currently and she has been checking. Stated she is seeing her physician again on Monday. She also reported he said not to do the rockerboard at this time.    Patient Stated Goals  get back to where she can move her knee independently     Currently in Pain?  Yes    Pain Score  4     Pain Location  Knee    Pain Orientation  Right    Pain Descriptors / Indicators  Aching;Sore    Pain Onset  1 to 4 weeks ago         [facility] PT Assessment - 11/21/18 0001      Observation/Other Assessments   Observations  Patient with minimal pinkness around the incision, no redness. Noted bruising above incision.                   [facility] PT Treatment/Exercise - 11/21/18 0001      Ambulation/Gait   Ambulation/Gait  Yes    Ambulation Distance (Feet)  300 Feet    Assistive device  Rolling walker    Gait Pattern  Step-through pattern;Decreased stance time - right;Decreased dorsiflexion - right;Decreased hip/knee flexion - right;Trendelenburg;Antalgic    Gait Comments  Verbal cues to try and make stance time more equal on each lower extremity      Knee/Hip Exercises: Stretches   Passive Hamstring Stretch  Right;3 reps;30 seconds    Passive Hamstring Stretch Limitations  Standing on 8'' step    Gastroc Stretch  Both;3 reps;30 seconds    Gastroc Stretch Limitations  slant board      Knee/Hip Exercises: Supine   Quad Sets  Right;20 reps    Short Arc [employer]  Right;15 reps    Short Arc Quad Sets Limitations  --   With right knee on top of bolster   Heel Slides  Right;10 reps    Knee Extension  AROM    Knee Extension Limitations  8   limited by pain   Knee Flexion  AROM    Knee Flexion Limitations  67   limited by pain            PT Education - 11/21/18 1608    Education Details  Discussed importance of continuing to  monitor for infection and to go to the hospital if she noted fever or other signs of infection.     Person(s) Educated  Patient    Methods  Explanation    Comprehension  Verbalized understanding       PT Short Term Goals - 11/10/18 1102      PT SHORT TERM GOAL #1   Title  Pt will have reduced edema by 2cm or > at joint line in order to reduce pain and improve ROM.     Time  3    Period  Weeks    Status  New    Target Date  12/01/18      PT SHORT TERM GOAL #2   Title  Pt will have improved R knee AROM from 8-90deg in order to reduce pain and improve sitting tolerance.     Time  3    Period  Weeks    Status  New      PT SHORT TERM GOAL #3   Title  Pt will have improved MMT to 4/5 throughout all mm groups in order to maximize balance, gait, and return to PLOF.    Time  3    Period  Weeks    Status  New      PT SHORT TERM GOAL #4   Title  Pt will be able to perform 5xSTS in 15sec or < without UE and with proper form to demo improved functional strength and balance    Time  3    Period  Weeks    Status  New        PT Long Term Goals - 11/10/18 1102      PT LONG TERM GOAL #1   Title  Pt will have improved R knee AROM from 0-115deg or better in order to further reduce pain, maximize gait, and stair ambulation.     Time  6    Period  Weeks    Status  New    Target Date  12/22/18      PT LONG TERM GOAL #2   Title  Pt will have improved MMT to 4+/5 throughout all mm groups in order to further maximize balance, gait, and tolerance to functional activities.     Time  6    Period  Weeks    Status  New      PT LONG TERM GOAL #3   Title  Pt will be able to perform bil SLS for 15sec or > without UE support to demo improved balance and functional strength to allow her to navigate stairs and ambulate on uneven ground with greater ease.     Time  6    Period  Weeks    Status  New      PT LONG TERM GOAL #4   Title  Pt will be able to ambulate 352ft or > during 3MWT without AD  and gait WFL in order to demo improved functional mobility and maximize return to PLOF.     Time  6    Period  Weeks    Status  New            Plan - 11/21/18 1622    Clinical Impression Statement  This session patient arrived reporting that her physician gave her antibiotics for a possible infection. She reported she was told she could continue exercises as tolerated and will be seeing the physician again on Monday. Therapist examined the incision along with the evaluating therapist, and noted only mild pink coloration around the incision and no redness, there was slight warmth but not excessive warmth around the incision. Continued with exercises previously performed and with gait training this session, all within  the patient's tolerance. Therapist chose not to perform manual therapy this session in order to not increase risk of mobilizing a possible infection. Patient with pain limiting ROM this session. Discussed signs of systemic infection and educated patient that she should go to the hospital should these sings present. Patient gave consent and stated she would call clinic on Monday to follow-up on information from her physician.     Rehab Potential  Good    PT Frequency  3x / week    PT Duration  6 weeks    PT Treatment/Interventions  ADLs/Self Care Home Management;Aquatic Therapy;Cryotherapy;Electrical Stimulation;DME Instruction;Gait training;Stair training;Functional mobility training;Therapeutic activities;Therapeutic exercise;Balance training;Neuromuscular re-education;Patient/family education;Manual techniques;Manual lymph drainage;Scar mobilization;Passive range of motion;Dry needling;Energy conservation;Taping    PT Next Visit Plan  F/U with MD apt. Monitor for signs of infection at each visit. Discontinue rockerboard until patient has progressed further.     PT Home Exercise Plan  eval: supine heel slide, quad set, supine HS stretch with rope       Patient will benefit from  skilled therapeutic intervention in order to improve the following deficits and impairments:  Abnormal gait, Decreased activity tolerance, Decreased balance, Decreased mobility, Decreased range of motion, Decreased scar mobility, Decreased strength, Difficulty walking, Hypomobility, Increased edema, Increased fascial restricitons, Increased muscle spasms, Impaired flexibility, Improper body mechanics, Postural dysfunction, Pain  Visit Diagnosis: Acute pain of right knee  Stiffness of right knee, not elsewhere classified  Localized edema  Difficulty in walking, not elsewhere classified     Problem List Patient Active Problem List   Diagnosis Date Noted  . Primary osteoarthritis of right knee 10/13/2018  . Chronic narcotic use 06/12/2016  . Encounter for chronic pain management 06/12/2016  . Bilateral sciatica 02/10/2016  . Spinal stenosis of lumbar region without neurogenic claudication 02/10/2016  . Gastroesophageal reflux disease without esophagitis 01/30/2016  . Chronic low back pain 10/12/2015  . Unstable angina ([place]) 05/04/2015  . CAD (coronary artery disease) 06/25/2014  . Chest pain 06/25/2014  . Paresthesia of both feet 03/11/2014  . Paresthesia of skin 03/11/2014  . Health maintenance examination 11/12/2013  . Acute exacerbation of chronic obstructive pulmonary disease (COPD) ([place]) 08/13/2013  . Arthritis of knee, degenerative 04/10/2013  . H/O Spinal surgery 03/11/2013  . Degenerative disc disease, lumbar 03/11/2013  . Degenerative arthritis of right knee 03/11/2013  . Anxiety state 03/03/2013  . COPD (chronic obstructive pulmonary disease) ([place]) 03/03/2013  . Generalized anxiety disorder 03/03/2013  . Osteoarthritis of knee 06/30/2012  . Right knee pain 06/26/2012  . Colon cancer screening 02/21/2012  . Breast cancer screening 02/21/2012  . Prediabetes 11/09/2010  . Hyperlipidemia 11/09/2010  . TOBACCO ABUSE 11/09/2010  . BACK PAIN, LUMBAR, CHRONIC 11/09/2010   . HEMATURIA, MICROSCOPIC, HX OF 11/09/2010   Clarene Critchley PT, DPT 4:24 PM, 11/21/18 [place], [place], [address] Phone: [phone]   Fax:  [phone]  Name: Brenda [name] MRN: [id number] Date of Birth: 1956/03/25

## 2018-11-24 ENCOUNTER — Ambulatory Visit (HOSPITAL_COMMUNITY): Payer: Medicare Other | Admitting: Physical Therapy

## 2018-11-24 DIAGNOSIS — M1711 Unilateral primary osteoarthritis, right knee: Secondary | ICD-10-CM | POA: Diagnosis not present

## 2018-11-26 ENCOUNTER — Ambulatory Visit (HOSPITAL_COMMUNITY): Payer: Medicare Other

## 2018-11-26 ENCOUNTER — Other Ambulatory Visit: Payer: Self-pay

## 2018-11-26 ENCOUNTER — Emergency Department (HOSPITAL_COMMUNITY)
Admission: EM | Admit: 2018-11-26 | Discharge: 2018-11-26 | Disposition: A | Discharge status: Home / Self Care | Payer: Medicare Other | Attending: Emergency Medicine | Admitting: Emergency Medicine

## 2018-11-26 ENCOUNTER — Emergency Department (HOSPITAL_COMMUNITY): Payer: Medicare Other

## 2018-11-26 ENCOUNTER — Encounter (HOSPITAL_COMMUNITY): Payer: Self-pay

## 2018-11-26 ENCOUNTER — Telehealth (HOSPITAL_COMMUNITY): Payer: Self-pay

## 2018-11-26 DIAGNOSIS — R112 Nausea with vomiting, unspecified: Secondary | ICD-10-CM | POA: Diagnosis not present

## 2018-11-26 DIAGNOSIS — E860 Dehydration: Secondary | ICD-10-CM

## 2018-11-26 DIAGNOSIS — Z79899 Other long term (current) drug therapy: Secondary | ICD-10-CM | POA: Insufficient documentation

## 2018-11-26 DIAGNOSIS — J449 Chronic obstructive pulmonary disease, unspecified: Secondary | ICD-10-CM | POA: Diagnosis not present

## 2018-11-26 DIAGNOSIS — R079 Chest pain, unspecified: Secondary | ICD-10-CM | POA: Diagnosis not present

## 2018-11-26 DIAGNOSIS — R197 Diarrhea, unspecified: Secondary | ICD-10-CM

## 2018-11-26 DIAGNOSIS — I2510 Atherosclerotic heart disease of native coronary artery without angina pectoris: Secondary | ICD-10-CM | POA: Diagnosis not present

## 2018-11-26 DIAGNOSIS — Z87891 Personal history of nicotine dependence: Secondary | ICD-10-CM | POA: Diagnosis not present

## 2018-11-26 DIAGNOSIS — R531 Weakness: Secondary | ICD-10-CM | POA: Insufficient documentation

## 2018-11-26 DIAGNOSIS — R110 Nausea: Secondary | ICD-10-CM | POA: Diagnosis not present

## 2018-11-26 DIAGNOSIS — Z7982 Long term (current) use of aspirin: Secondary | ICD-10-CM | POA: Diagnosis not present

## 2018-11-26 DIAGNOSIS — I4510 Unspecified right bundle-branch block: Secondary | ICD-10-CM | POA: Diagnosis not present

## 2018-11-26 DIAGNOSIS — R55 Syncope and collapse: Secondary | ICD-10-CM | POA: Diagnosis not present

## 2018-11-26 LAB — CBC WITH DIFFERENTIAL/PLATELET
Abs Immature Granulocytes: 0.06 10*3/uL (ref 0.00–0.07)
Basophils Absolute: 0 10*3/uL (ref 0.0–0.1)
Basophils Relative: 0 %
EOS PCT: 1 %
Eosinophils Absolute: 0.1 10*3/uL (ref 0.0–0.5)
HEMATOCRIT: 45.3 % (ref 36.0–46.0)
Hemoglobin: 15.1 g/dL — ABNORMAL HIGH (ref 12.0–15.0)
Immature Granulocytes: 0 %
LYMPHS PCT: 18 %
Lymphs Abs: 2.9 10*3/uL (ref 0.7–4.0)
MCH: 30.1 pg (ref 26.0–34.0)
MCHC: 33.3 g/dL (ref 30.0–36.0)
MCV: 90.4 fL (ref 80.0–100.0)
Monocytes Absolute: 0.9 10*3/uL (ref 0.1–1.0)
Monocytes Relative: 6 %
Neutro Abs: 11.9 10*3/uL — ABNORMAL HIGH (ref 1.7–7.7)
Neutrophils Relative %: 75 %
Platelets: 393 10*3/uL (ref 150–400)
RBC: 5.01 MIL/uL (ref 3.87–5.11)
RDW: 13.1 % (ref 11.5–15.5)
WBC: 15.9 10*3/uL — AB (ref 4.0–10.5)
nRBC: 0 % (ref 0.0–0.2)

## 2018-11-26 LAB — COMPREHENSIVE METABOLIC PANEL
ALBUMIN: 4.1 g/dL (ref 3.5–5.0)
ALT: 12 U/L (ref 0–44)
AST: 14 U/L — ABNORMAL LOW (ref 15–41)
Alkaline Phosphatase: 60 U/L (ref 38–126)
Anion gap: 12 (ref 5–15)
BILIRUBIN TOTAL: 1.1 mg/dL (ref 0.3–1.2)
BUN: 17 mg/dL (ref 8–23)
CO2: 21 mmol/L — ABNORMAL LOW (ref 22–32)
Calcium: 9.6 mg/dL (ref 8.9–10.3)
Chloride: 106 mmol/L (ref 98–111)
Creatinine, Ser: 0.65 mg/dL (ref 0.44–1.00)
GFR calc Af Amer: >60 mL/min (ref >60)
GFR calc non Af Amer: >60 mL/min (ref >60)
Glucose, Bld: 113 mg/dL — ABNORMAL HIGH (ref 70–99)
Potassium: 3.4 mmol/L — ABNORMAL LOW (ref 3.5–5.1)
Sodium: 139 mmol/L (ref 135–145)
Total Protein: 7.7 g/dL (ref 6.5–8.1)

## 2018-11-26 LAB — URINALYSIS, ROUTINE W REFLEX MICROSCOPIC
BILIRUBIN URINE: NEGATIVE
Bacteria, UA: NONE SEEN
Glucose, UA: NEGATIVE mg/dL
Ketones, ur: NEGATIVE mg/dL
Leukocytes, UA: NEGATIVE
Nitrite: NEGATIVE
Protein, ur: NEGATIVE mg/dL
SPECIFIC GRAVITY, URINE: 1.009 (ref 1.005–1.030)
pH: 6 (ref 5.0–8.0)

## 2018-11-26 LAB — RAPID URINE DRUG SCREEN, HOSP PERFORMED
AMPHETAMINES: NOT DETECTED
Barbiturates: NOT DETECTED
Benzodiazepines: NOT DETECTED
Cocaine: NOT DETECTED
Opiates: NOT DETECTED
Tetrahydrocannabinol: NOT DETECTED

## 2018-11-26 LAB — C DIFFICILE QUICK SCREEN W PCR REFLEX
C DIFFICILE (CDIFF) TOXIN: NEGATIVE
C Diff antigen: NEGATIVE
C Diff interpretation: NOT DETECTED

## 2018-11-26 LAB — LIPASE, BLOOD: Lipase: 97 U/L — ABNORMAL HIGH (ref 11–51)

## 2018-11-26 LAB — AMMONIA: Ammonia: 25 umol/L (ref 9–35)

## 2018-11-26 MED ORDER — LOPERAMIDE HCL 2 MG PO CAPS: 2.0000 mg | ORAL_CAPSULE | Freq: Four times a day (QID) | ORAL | 0 refills | Status: DC | PRN

## 2018-11-26 MED ORDER — SODIUM CHLORIDE 0.9 % IV BOLUS
1000.0000 mL | Freq: Once | INTRAVENOUS | Status: AC
  Administered 2018-11-26: 1000 mL via INTRAVENOUS

## 2018-11-26 MED ORDER — ONDANSETRON 4 MG PO TBDP: 4.0000 mg | ORAL_TABLET | Freq: Three times a day (TID) | ORAL | 0 refills | Status: DC | PRN

## 2018-11-26 MED ORDER — LOPERAMIDE HCL 2 MG PO CAPS
4.0000 mg | ORAL_CAPSULE | Freq: Once | ORAL | Status: AC
  Administered 2018-11-26: 4 mg via ORAL
  Filled 2018-11-26: qty 2

## 2018-11-26 MED ORDER — ONDANSETRON HCL 4 MG/2ML IJ SOLN
4.0000 mg | Freq: Once | INTRAMUSCULAR | Status: AC
  Administered 2018-11-26: 4 mg via INTRAVENOUS
  Filled 2018-11-26: qty 2

## 2018-11-26 NOTE — Discharge Instructions (Addendum)
You are being prescribed medication in case you nausea or diarrhea return or persist.  Make sure you are drinking plenty of fluids as discussed.  Return here or call your doctor for recheck of your symptoms if they persist.  Your lipase lab is a little bit elevated.  This is an enzyme that your pancreas makes.  This may be elevated due to all the vomiting that you have had over the past several days.  It would be a good idea to have your doctor recheck this blood test once you are over this illness.

## 2018-11-26 NOTE — Telephone Encounter (Signed)
Patient is in the ED she is having problems with the  medication she is taking.

## 2018-11-26 NOTE — ED Notes (Signed)
Water given for fluid challenge  ?

## 2018-11-26 NOTE — ED Triage Notes (Addendum)
Pt reports n/v/d since Monday.  Reports had been taking oxycodone for knee pain for the past 6 months.  Pt says she stopped the pain medication Monday.  Pt requesting no narcotic medication.  EMS reports pt had near syncopal episode when she tried to stand at home.

## 2018-11-26 NOTE — ED Provider Notes (Signed)
[facility] EMERGENCY DEPARTMENT Provider Note   CSN: [id number] Arrival date & time: 11/26/18  0831     History   Chief Complaint Chief Complaint  Patient presents with  . Emesis  . Diarrhea    HPI Brenda [name] is a 63 y.o. female who is currently 3 weeks out from a partial right knee arthroplasty presenting with a 3-day history of nausea, vomiting and diarrhea with increased weakness and near syncopal event occurring this morning with attempts to stand at her home.  She reports being on chronic oxycodone 5 mg, taking 1 tablet every 4 hours since her surgery and states that she has slept a lot since her surgery, remembers very little about the last 3 weeks.  Sunday night she decided she did not to take her pain medicine any longer and by Monday evening was having nausea vomiting and diarrhea.  She also endorses low bilateral abdominal pain described as cramping which improves briefly after a bowel movement.  For the past 24 hours she has had a nonbloody liquid stool every 1 to 1-1/2 hours.  She reports feeling very weak, also endorses cough with mild shortness of breath although states this is chronic and stable with her history of COPD.  Denies chest pain, she did feel her heart racing when she attempted to stand this morning.  She has been undergoing physical therapy and per her chart, her surgeon started her on Keflex last week secondary to concern for possible infection at the surgical site.  She has had subjective fever and chills.  She also feels very jittery this morning.  She has had no medications prior to arrival for her vomiting or her diarrhea.  She very emphatically does not want any narcotic pain medications during her visit today.  HPI  Past Medical History:  Diagnosis Date  . Aortic valve stenosis    "very mild"  . Atypical chest pain 2007; 2015   cardiolyte neg, echo nl, cath showed mild/nonobstructive LAD disease  . Bilateral primary osteoarthritis of knee  06/30/2012   Right >L.  Plan for medial compartment knee arthroplasty as of 09/2018.  Marland Kitchen CAD (coronary artery disease)   . Chronic LBP    Multiple surgeries  . COPD (chronic obstructive pulmonary disease) ([place])   . DDD (degenerative disc disease)    spinal stenosis  . Fatty liver   . History of GI bleed    NSAIDS  . Hyperlipidemia    mixed  . Microscopic hematuria    full w/ u unrevealing X 2  . Normal nuclear stress test 11/11 and 06/2014   negative, EF normal  . Palpitations 2006   48H holter neg  . PONV (postoperative nausea and vomiting)    "never threw up but felt sick on my stomach", also believes that she was aware of surgery during her hysterectomy   . Pre-diabetes   . Prediabetes    Highest A1c 6.1% as of 03/2017  . RBBB (right bundle branch block)   . Recurrent UTI   . Tobacco dependence in remission    Quit fall 2015.  Only occasional bronchitis illness (CT shest 2009 nl- for f/u of ? nodule on CXR)    Patient Active Problem List   Diagnosis Date Noted  . Primary osteoarthritis of right knee 10/13/2018  . Chronic narcotic use 06/12/2016  . Encounter for chronic pain management 06/12/2016  . Bilateral sciatica 02/10/2016  . Spinal stenosis of lumbar region without neurogenic claudication 02/10/2016  . Gastroesophageal  reflux disease without esophagitis 01/30/2016  . Chronic low back pain 10/12/2015  . Unstable angina ([place]) 05/04/2015  . CAD (coronary artery disease) 06/25/2014  . Chest pain 06/25/2014  . Paresthesia of both feet 03/11/2014  . Paresthesia of skin 03/11/2014  . Health maintenance examination 11/12/2013  . Acute exacerbation of chronic obstructive pulmonary disease (COPD) ([place]) 08/13/2013  . Arthritis of knee, degenerative 04/10/2013  . H/O Spinal surgery 03/11/2013  . Degenerative disc disease, lumbar 03/11/2013  . Degenerative arthritis of right knee 03/11/2013  . Anxiety state 03/03/2013  . COPD (chronic obstructive pulmonary disease) ([place])  03/03/2013  . Generalized anxiety disorder 03/03/2013  . Osteoarthritis of knee 06/30/2012  . Right knee pain 06/26/2012  . Colon cancer screening 02/21/2012  . Breast cancer screening 02/21/2012  . Prediabetes 11/09/2010  . Hyperlipidemia 11/09/2010  . TOBACCO ABUSE 11/09/2010  . BACK PAIN, LUMBAR, CHRONIC 11/09/2010  . HEMATURIA, MICROSCOPIC, HX OF 11/09/2010    Past Surgical History:  Procedure Laterality Date  . ABDOMINAL HYSTERECTOMY  1997   DUB  . APPENDECTOMY  1984  . CARDIAC CATHETERIZATION  10/09/2005   no CAD, mildly elevated LVEDP, normal LV function (Dr. Gerrie Nordmann)  . CARDIAC CATHETERIZATION N/A 05/16/2015   Mild non-obstructive CAD--med mgmt.  Procedure: Left Heart Cath and Coronary Angiography;  Surgeon: Pixie Casino, MD;  Location: [facility];  Service: Cardiovascular;  Laterality: N/A;  . CARDIOVASCULAR STRESS TEST  06/2014   normal lexiscan NST  . CARDIOVASCULAR STRESS TEST  2006   persantine - no ischemia, low risk   . KNEE ARTHROSCOPY Bilateral   . KNEE SURGERY    . left wrist ganglion cyst excision  2010  . [place]   right iliac crest bone graft+metal instrumentation; 2005 metal removal and decompression, 2011 lumbar decompression 4-11, then stabilization/ instrumentation done 09-19-10: L2,L3, L5 left and L2 , L3, L4 right pedical remnant L4 left embedded. Left iliac crest bone graft-- Dr Velna Ochs  . LUMBAR SPINE SURGERY  02/10/2016   Dr. Velna Ochs: lumbar decompression, instrumentation removal, and fusion exploration--HELPED her a lot, esp her radicular leg pains.  . OOPHORECTOMY Right    cyst  . PARTIAL KNEE ARTHROPLASTY Right 11/04/2018   Procedure: UNICOMPARTMENTAL KNEE;  Surgeon: Renette Butters, MD;  Location: [facility];  Service: Orthopedics;  Laterality: Right;  Adductor Block  . TONSILLECTOMY  63 yrs old  . TONSILLECTOMY    . TRANSTHORACIC ECHOCARDIOGRAM  2006   EF=>55%, trace MR, mild TR, trace AV regurg, trace pulm valve  regurg      OB History   No obstetric history on file.      Home Medications    Prior to Admission medications   Medication Sig Start Date End Date Taking? Authorizing Provider  acetaminophen (TYLENOL) 500 MG tablet every 6 (six) hours as needed for mild pain or headache.   Yes [provider]  albuterol (PROVENTIL HFA;VENTOLIN HFA) 108 (90 Base) MCG/ACT inhaler Inhale 2 puffs into the lungs every 4 (four) hours as needed for wheezing or shortness of breath (bronchitis). Reported on 03/12/2016 03/14/17  Yes McGowen, Adrian Blackwater, MD  aspirin EC 81 MG tablet Take 1 tablet (81 mg total) by mouth 2 (two) times daily. For DVT prophylaxis for 30 days after surgery. 11/04/18  Yes Prudencio Burly III, PA-C  cephALEXin (KEFLEX) 500 MG capsule Take 1 capsule by mouth daily. For 7 days 11/19/18  Yes [provider]  clopidogrel (PLAVIX) 75 MG  tablet Take 1 tablet (75 mg total) by mouth daily. 06/05/18  Yes McGowen, Adrian Blackwater, MD  docusate sodium (COLACE) 100 MG capsule Take 1 capsule (100 mg total) by mouth 2 (two) times daily. To prevent constipation while taking pain medication. 11/04/18  Yes Prudencio Burly III, PA-C  fenofibrate 160 MG tablet take 1 tablet by mouth once daily with food Patient taking differently: Take 160 mg by mouth daily.  06/05/18  Yes McGowen, Adrian Blackwater, MD  LORazepam (ATIVAN) 1 MG tablet Take 1 tablet (1 mg total) by mouth 2 (two) times daily. 09/11/18  Yes McGowen, Adrian Blackwater, MD  oxyCODONE (ROXICODONE) 5 MG immediate release tablet Take 1 tablet (5 mg total) by mouth every 4 (four) hours as needed for breakthrough pain. 11/04/18 12/04/18 Yes Martensen, Charna Elizabeth III, PA-C  pantoprazole (PROTONIX) 40 MG tablet Take 1 tablet (40 mg total) by mouth daily. 06/05/18  Yes McGowen, Adrian Blackwater, MD  rosuvastatin (CRESTOR) 40 MG tablet Take 1 tablet (40 mg total) by mouth daily. 06/05/18  Yes McGowen, Adrian Blackwater, MD  sodium chloride (OCEAN) 0.65 % SOLN nasal spray Place 1  spray into both nostrils as needed for congestion.   Yes [provider]  cyclobenzaprine (FLEXERIL) 10 MG tablet take 1 tablet by mouth every 8 hours if needed Patient taking differently: Take 10 mg by mouth 3 (three) times daily as needed for muscle spasms.  05/05/18   McGowen, Adrian Blackwater, MD  nitroGLYCERIN (NITROSTAT) 0.4 MG SL tablet Place 1 tablet (0.4 mg total) under the tongue every 5 (five) minutes as needed for chest pain (MAX 3 doses.). 08/19/14   Hilty, Nadean Corwin, MD  ondansetron (ZOFRAN) 4 MG tablet Take 1 tablet (4 mg total) by mouth every 8 (eight) hours as needed for nausea or vomiting. 11/04/18   Prudencio Burly III, PA-C    Family History Family History  Problem Relation Age of Onset  . Heart Problems Mother        and thyroid problems  . Hyperlipidemia Mother   . Diabetes Mother   . Breast cancer Mother   . Heart failure Father        CHF, heart attack, diabetes, hyperlipidemia  . Heart disease Brother        back problems  . Hypertension Brother   . Kidney failure Maternal Grandmother   . Stroke Maternal Grandfather   . Cancer Paternal Grandmother        mouth - snuff  . Heart attack Paternal Grandfather        stroke, HTN  . Hyperlipidemia Brother   . Heart attack Brother   . Cancer Brother   . Coronary artery disease Neg Hx     Social History Social History   Tobacco Use  . Smoking status: Former Smoker    Packs/day: 1.00    Years: 35.00    Pack years: 35.00    Types: Cigarettes    Last attempt to quit: 07/22/2014    Years since quitting: 4.3  . Smokeless tobacco: Never Used  . Tobacco comment: down to ~2 cigarettes/daily (06/23/14) - Quit Smoking around 07/20/14!  Substance Use Topics  . Alcohol use: No    Alcohol/week: 0.0 standard drinks  . Drug use: No     Allergies   Aspirin; Beta adrenergic blockers; Nsaids; Penicillins; Prochlorperazine edisylate; Sulfonamide derivatives; and Amitriptyline hcl   Review of Systems Review of  Systems  Constitutional: Positive for fatigue and fever.  HENT: Negative for congestion  and sore throat.   Eyes: Negative.   Respiratory: Positive for shortness of breath. Negative for chest tightness.   Cardiovascular: Positive for palpitations. Negative for chest pain.  Gastrointestinal: Positive for abdominal pain, diarrhea, nausea and vomiting.  Genitourinary: Negative.  Negative for dysuria.  Musculoskeletal: Negative for arthralgias, joint swelling and neck pain.  Skin: Negative.  Negative for rash and wound.  Neurological: Positive for weakness and light-headedness. Negative for dizziness, numbness and headaches.  Psychiatric/Behavioral: Negative.      Physical Exam Updated Vital Signs BP 126/68   Pulse 69   Temp 98.4 F (36.9 C) (Oral)   Resp 18   Ht 5\' 5"  (1.651 m)   Wt 89 kg   SpO2 97%   BMI 32.65 kg/m   Physical Exam Vitals signs and nursing note reviewed.  Constitutional:      Appearance: She is well-developed. She is ill-appearing.     Comments: Tremor noted in upper extremities.  HENT:     Head: Normocephalic and atraumatic.     Mouth/Throat:     Mouth: Mucous membranes are dry.     Pharynx: No oropharyngeal exudate or posterior oropharyngeal erythema.  Eyes:     Conjunctiva/sclera: Conjunctivae normal.  Neck:     Musculoskeletal: Neck supple.  Cardiovascular:     Rate and Rhythm: Normal rate and regular rhythm.     Heart sounds: Normal heart sounds.  Pulmonary:     Effort: Pulmonary effort is normal.     Breath sounds: Normal breath sounds. No wheezing.  Abdominal:     General: Bowel sounds are increased.     Palpations: Abdomen is soft.     Tenderness: There is no abdominal tenderness. There is no guarding or rebound.  Musculoskeletal: Normal range of motion.  Skin:    General: Skin is warm and dry.  Neurological:     Mental Status: She is alert.      ED Treatments / Results  Labs (all labs ordered are listed, but only abnormal results  are displayed) Labs Reviewed  CBC WITH DIFFERENTIAL/PLATELET - Abnormal; Notable for the following components:      Result Value   WBC 15.9 (*)    Hemoglobin 15.1 (*)    Neutro Abs 11.9 (*)    All other components within normal limits  COMPREHENSIVE METABOLIC PANEL - Abnormal; Notable for the following components:   Potassium 3.4 (*)    CO2 21 (*)    Glucose, Bld 113 (*)    AST 14 (*)    All other components within normal limits  LIPASE, BLOOD - Abnormal; Notable for the following components:   Lipase 97 (*)    All other components within normal limits  URINALYSIS, ROUTINE W REFLEX MICROSCOPIC - Abnormal; Notable for the following components:   Hgb urine dipstick SMALL (*)    All other components within normal limits  C DIFFICILE QUICK SCREEN W PCR REFLEX  AMMONIA  RAPID URINE DRUG SCREEN, HOSP PERFORMED    EKG EKG Interpretation  Date/Time:  Wednesday November 26 2018 09:07:33 EST Ventricular Rate:  76 PR Interval:    QRS Duration: 122 QT Interval:  409 QTC Calculation: 460 R Axis:   6 Text Interpretation:  Sinus rhythm Right bundle branch block new from prior.  No STEMI.  Confirmed by Nanda Quinton [phone]) on 11/26/2018 10:15:48 AM   Radiology Ct Head Wo Contrast  Result Date: 11/26/2018 CLINICAL DATA:  Near syncope with nausea EXAM: CT HEAD WITHOUT CONTRAST TECHNIQUE: Contiguous axial  images were obtained from the base of the skull through the vertex without intravenous contrast. COMPARISON:  June 02, 2009 FINDINGS: Brain: The ventricles are normal in size and configuration. There is no intracranial mass, hemorrhage, extra-axial fluid collection, or midline shift. Brain parenchyma appears unremarkable. No evident acute infarct. Vascular: There is no hyperdense vessel. There is calcification in each carotid siphon region. Skull: Bony calvarium appears intact. Sinuses/Orbits: There is mucosal thickening in several ethmoid air cells bilaterally. There is opacification in the  posterior right sphenoid sinus. There is also patchy opacity in the left sphenoid sinus. Visualized orbits appear symmetric bilaterally. Other: Mastoid air cells are clear. IMPRESSION: Areas of paranasal sinus disease. Foci of arterial vascular calcification noted. Study otherwise unremarkable. Electronically Signed   By: Lowella Grip III M.D.   On: 11/26/2018 10:46   Dg Chest Portable 1 View  Result Date: 11/26/2018 CLINICAL DATA:  Chest pain with fever nausea and vomiting with headache EXAM: PORTABLE CHEST - 1 VIEW COMPARISON:  10/20/2014 FINDINGS: Lungs are clear. Heart size upper limits normal. No effusion.  No pneumothorax. Visualized bones unremarkable. IMPRESSION: No acute cardiopulmonary disease. Electronically Signed   By: Lucrezia Europe M.D.   On: 11/26/2018 09:21    Procedures Procedures (including critical care time)  Medications Ordered in ED Medications  ondansetron (ZOFRAN) injection 4 mg (4 mg Intravenous Given 11/26/18 0901)  sodium chloride 0.9 % bolus 1,000 mL (0 mLs Intravenous Stopped 11/26/18 1015)  loperamide (IMODIUM) capsule 4 mg (4 mg Oral Given 11/26/18 1237)  sodium chloride 0.9 % bolus 1,000 mL (0 mLs Intravenous Stopped 11/26/18 1542)     Initial Impression / Assessment and Plan / ED Course  I have reviewed the triage vital signs and the nursing notes.  Pertinent labs & imaging results that were available during my care of the patient were reviewed by me and considered in my medical decision making (see chart for details).     10:40 AM husband now at bedside and confirms that patient has not had confusion over the past several weeks since her surgery but has been drowsy and sleeping more, suspected due to the increased dosing of her oxycodone.  Also clarifies that with prior surgeries it has taken weeks to a month for her memory to clear and has always been felt to be a side effect from anesthesia.  Pt was given IV fluids and tolerated PO intake as well.  After  work up completed, orthostatics obtained and she was tilt positive, further IV fluid bolus given.  She had no emesis after initial dose of zofran.  Few very small loose stools.  Started on imodium.  Pt stable for dc home, zofran and imodium. Home care instructions discussed including BRAT diet.  Prn f/u anticipated.  Discussed bump in her lipase, prob from emesis, but recommended repeat lab by pcp once sx resolved.   Final Clinical Impressions(s) / ED Diagnoses   Final diagnoses:  Nausea vomiting and diarrhea  Dehydration    ED Discharge Orders         Ordered    ondansetron (ZOFRAN ODT) 4 MG disintegrating tablet  Every 8 hours PRN     11/26/18 1523    loperamide (IMODIUM) 2 MG capsule  4 times daily PRN     11/26/18 1523           Evalee Jefferson, PA-C 11/26/18 1702    Long, Wonda Old[facility], MD 11/26/18 [phone]

## 2018-11-28 ENCOUNTER — Encounter: Payer: Self-pay | Admitting: Family Medicine

## 2018-11-28 ENCOUNTER — Telehealth (HOSPITAL_COMMUNITY): Payer: Self-pay

## 2018-11-28 ENCOUNTER — Ambulatory Visit (HOSPITAL_COMMUNITY): Payer: Medicare Other

## 2018-12-01 ENCOUNTER — Ambulatory Visit (HOSPITAL_COMMUNITY): Payer: Medicare Other

## 2018-12-01 ENCOUNTER — Encounter (HOSPITAL_COMMUNITY): Payer: Self-pay

## 2018-12-01 DIAGNOSIS — R600 Localized edema: Secondary | ICD-10-CM | POA: Diagnosis not present

## 2018-12-01 DIAGNOSIS — M25561 Pain in right knee: Secondary | ICD-10-CM

## 2018-12-01 DIAGNOSIS — M1711 Unilateral primary osteoarthritis, right knee: Secondary | ICD-10-CM | POA: Diagnosis not present

## 2018-12-01 DIAGNOSIS — R262 Difficulty in walking, not elsewhere classified: Secondary | ICD-10-CM | POA: Diagnosis not present

## 2018-12-01 DIAGNOSIS — M25661 Stiffness of right knee, not elsewhere classified: Secondary | ICD-10-CM

## 2018-12-01 NOTE — Therapy (Signed)
[place], [place], [address] Phone: [phone]   Fax:  [phone]   Progress Note Reporting Period 11/10/18 to 12/01/18  See note below for Objective Data and Assessment of Progress/Goals.    Physical Therapy Treatment  Patient Details  Name: Brenda [name] MRN: [id number] Date of Birth: 02-06-1956 Referring Provider (PT): Edmonia Lynch, MD   Encounter Date: 12/01/2018  PT End of Session - 12/01/18 1121    Visit Number  7    Number of Visits  19    Date for PT Re-Evaluation  12/22/18   Minireassess 12/01/18   Authorization Type  Medicare    Authorization Time Period  11/10/18 top 12/22/18    Authorization - Visit Number  7    Authorization - Number of Visits  10    PT Start Time  9242    PT Stop Time  1200    PT Time Calculation (min)  43 min    Activity Tolerance  Patient limited by pain;Patient tolerated treatment well    Behavior During Therapy  [facility] for tasks assessed/performed       Past Medical History:  Diagnosis Date  . Aortic valve stenosis    "very mild"  . Atypical chest pain 2007; 2015   cardiolyte neg, echo nl, cath showed mild/nonobstructive LAD disease  . Bilateral primary osteoarthritis of knee 06/30/2012   Right >L.  Right unicompartmental knee arthroplasty 10/25/18  . CAD (coronary artery disease)   . Chronic LBP    Multiple surgeries  . COPD (chronic obstructive pulmonary disease) ([place])   . DDD (degenerative disc disease)    spinal stenosis  . Fatty liver   . History of GI bleed    NSAIDS  . Hyperlipidemia    mixed  . Microscopic hematuria    full w/ u unrevealing X 2  . Normal nuclear stress test 11/11 and 06/2014   negative, EF normal  . Palpitations 2006   48H holter neg  . PONV (postoperative nausea and vomiting)    "never threw up but felt sick on my stomach", also believes that she was aware of surgery during her hysterectomy   . Pre-diabetes   . Prediabetes     Highest A1c 6.1% as of 03/2017  . RBBB (right bundle branch block)   . Recurrent UTI   . Tobacco dependence in remission    Quit fall 2015.  Only occasional bronchitis illness (CT shest 2009 nl- for f/u of ? nodule on CXR)    Past Surgical History:  Procedure Laterality Date  . ABDOMINAL HYSTERECTOMY  1997   DUB  . APPENDECTOMY  1984  . CARDIAC CATHETERIZATION  10/09/2005   no CAD, mildly elevated LVEDP, normal LV function (Dr. Gerrie Nordmann)  . CARDIAC CATHETERIZATION N/A 05/16/2015   Mild non-obstructive CAD--med mgmt.  Procedure: Left Heart Cath and Coronary Angiography;  Surgeon: Pixie Casino, MD;  Location: [facility];  Service: Cardiovascular;  Laterality: N/A;  . CARDIOVASCULAR STRESS TEST  06/2014   normal lexiscan NST  . CARDIOVASCULAR STRESS TEST  2006   persantine - no ischemia, low risk   . KNEE ARTHROSCOPY Bilateral   . left wrist ganglion cyst excision  2010  . [place]   right iliac crest bone graft+metal instrumentation; 2005 metal removal and decompression, 2011 lumbar decompression 4-11, then stabilization/ instrumentation done 09-19-10: L2,L3, L5 left and L2 , L3, L4 right pedical remnant L4 left  embedded. Left iliac crest bone graft-- Dr Velna Ochs  . LUMBAR SPINE SURGERY  02/10/2016   Dr. Velna Ochs: lumbar decompression, instrumentation removal, and fusion exploration--HELPED her a lot, esp her radicular leg pains.  . OOPHORECTOMY Right    cyst  . PARTIAL KNEE ARTHROPLASTY Right 11/04/2018   Procedure: UNICOMPARTMENTAL KNEE;  Surgeon: Renette Butters, MD;  Location: [facility];  Service: Orthopedics;  Laterality: Right;  Adductor Block  . TONSILLECTOMY  63 yrs old  . TONSILLECTOMY    . TRANSTHORACIC ECHOCARDIOGRAM  2006   EF=>55%, trace MR, mild TR, trace AV regurg, trace pulm valve regurg     There were no vitals filed for this visit.  Subjective Assessment - 12/01/18 1121    Subjective  Pt states that she went to the hospital last week for  dehydration; she was not admitted but was given IV fluids and she feels much better. She states that she also was running a fever again this past Friday. She saw Dr. Percell Miller this morning and he told her that it is still swollen and warm more so than usual. She is off her antibiotics now. She sees Dr. Percell Miller again next Monday.    Patient Stated Goals  get back to where she can move her knee independently     Currently in Pain?  Yes    Pain Score  2     Pain Location  Knee    Pain Orientation  Right    Pain Descriptors / Indicators  Aching    Pain Type  Chronic pain    Pain Onset  1 to 4 weeks ago    Pain Frequency  Constant    Aggravating Factors   moving, WB    Pain Relieving Factors  ice pack, CPM    Effect of Pain on Daily Activities  increases         [facility] PT Assessment - 12/01/18 0001      Assessment   Medical Diagnosis  s/p partial R knee replacement    Referring Provider (PT)  Edmonia Lynch, MD    Onset Date/Surgical Date  11/04/18    Next MD Visit  12/08/18    Prior Therapy  yes for LBP      Observation/Other Assessments   Focus on Therapeutic Outcomes (FOTO)   34% limited    was 76%     Circumferential Edema   Circumferential - Right  36.5cm, joint line   was 39.25cm at joint line   Circumferential - Left   --      AROM   Right Knee Extension  5   was 4   Right Knee Flexion  103   was 74     Strength   Right Hip Flexion  4+/5   was 3-   Right Hip Extension  3+/5    Right Hip ABduction  4/5    Left Hip Flexion  5/5   was 4   Left Hip Extension  4-/5    Left Hip ABduction  4/5    Right Knee Flexion  3+/5    Right Knee Extension  4+/5   was 3-   Left Knee Flexion  5/5    Right Ankle Dorsiflexion  5/5    Left Ankle Dorsiflexion  5/5      Ambulation/Gait   Ambulation Distance (Feet)  696 Feet   3MWT; was 31f, RW   Assistive device  None    Gait Pattern  Step-through pattern;Decreased stance time - right;Decreased  step length - left;Antalgic       Balance   Balance Assessed  Yes      Static Standing Balance   Static Standing - Balance Support  No upper extremity supported    Static Standing Balance -  Activities   Single Leg Stance - Right Leg;Single Leg Stance - Left Leg    Static Standing - Comment/# of Minutes  R: 4.5sec L: 28sec   was 0sec R, 12sec L     Standardized Balance Assessment   Standardized Balance Assessment  Five Times Sit to Stand    Five times sit to stand comments   9.4sec, RLE slightly extended, no UE   was 18.9sec, no UE, RLE extended            [facility] Adult PT Treatment/Exercise - 12/01/18 0001      Knee/Hip Exercises: Seated   Long Arc Quad  Right;2 sets;10 reps    Heel Slides  Right;10 reps    Heel Slides Limitations  x10" holds      Knee/Hip Exercises: Supine   Short Arc Quad Sets  Right;2 sets;10 reps    Short Arc Quad Sets Limitations  2#    Straight Leg Raises  Right;2 sets;10 reps              PT Short Term Goals - 12/01/18 1124      PT SHORT TERM GOAL #1   Title  Pt will have reduced edema by 2cm or > at joint line in order to reduce pain and improve ROM.     Time  3    Period  Weeks    Status  Achieved      PT SHORT TERM GOAL #2   Title  Pt will have improved R knee AROM from 8-90deg in order to reduce pain and improve sitting tolerance.     Baseline  1/27: 5-103deg    Time  3    Period  Weeks    Status  Achieved      PT SHORT TERM GOAL #3   Title  Pt will have improved MMT to 4/5 throughout all mm groups in order to maximize balance, gait, and return to PLOF.    Baseline  1/27: see MMT    Time  3    Period  Weeks    Status  Partially Met      PT SHORT TERM GOAL #4   Title  Pt will be able to perform 5xSTS in 15sec or < without UE and with proper form to demo improved functional strength and balance    Baseline  1/27: 9.6sec, RLE extended    Time  3    Period  Weeks    Status  Partially Met        PT Long Term Goals - 12/01/18 1125      PT LONG TERM GOAL  #1   Title  Pt will have improved R knee AROM from 0-115deg or better in order to further reduce pain, maximize gait, and stair ambulation.     Baseline  1/27: 5-103deg    Time  6    Period  Weeks    Status  On-going      PT LONG TERM GOAL #2   Title  Pt will have improved MMT to 4+/5 throughout all mm groups in order to further maximize balance, gait, and tolerance to functional activities.     Baseline  1/27: see MMT    Time  6    Period  Weeks    Status  Partially Met      PT LONG TERM GOAL #3   Title  Pt will be able to perform bil SLS for 15sec or > without UE support to demo improved balance and functional strength to allow her to navigate stairs and ambulate on uneven ground with greater ease.     Baseline  1/27: L: 28sec, R: 4.5sec    Time  6    Period  Weeks    Status  Partially Met      PT LONG TERM GOAL #4   Title  Pt will be able to ambulate 335f or > during 3MWT without AD and gait WFL in order to demo improved functional mobility and maximize return to PLOF.     Baseline  1/27: 6957f no AD, gait deviations continue    Time  6    Period  Weeks    Status  Partially Met            Plan - 12/01/18 1204    Clinical Impression Statement  PT reassessed pt's goals and outcome measures this date. Pt has overall made great progress towards goals and has had a much-improved tolerance to functional mobility this date compared to previous sessions. Pt stating that she went to the hospital last week due to dehydration from "having a virus" but she is not sure what the virus was. Pt's AROM 5-103deg (by EOS) this date and her MMT has improved since her initial eval, but her balance and overall functional strength are still deficient as illustrated above. While she was able to perform the 3MWT without AD, she still has gait deviations that remain. Overall, pt has made a vast improvement since her last treatment session with regards to tolerance and overall pain but needs continued  skilled PT intervention in order to continue to address her impairments and promote return to PLOF.     Rehab Potential  Good    PT Frequency  3x / week    PT Duration  6 weeks    PT Treatment/Interventions  ADLs/Self Care Home Management;Aquatic Therapy;Cryotherapy;Electrical Stimulation;DME Instruction;Gait training;Stair training;Functional mobility training;Therapeutic activities;Therapeutic exercise;Balance training;Neuromuscular re-education;Patient/family education;Manual techniques;Manual lymph drainage;Scar mobilization;Passive range of motion;Dry needling;Energy conservation;Taping    PT Next Visit Plan  F/U with MD apt. Monitor for signs of infection at each visit. conintue to address ROM and strengthen when normalized    PT Home Exercise Plan  eval: supine heel slide, quad set, supine HS stretch with rope; 1/27: SAQ    Consulted and Agree with Plan of Care  Patient       Patient will benefit from skilled therapeutic intervention in order to improve the following deficits and impairments:  Abnormal gait, Decreased activity tolerance, Decreased balance, Decreased mobility, Decreased range of motion, Decreased scar mobility, Decreased strength, Difficulty walking, Hypomobility, Increased edema, Increased fascial restricitons, Increased muscle spasms, Impaired flexibility, Improper body mechanics, Postural dysfunction, Pain  Visit Diagnosis: Acute pain of right knee  Stiffness of right knee, not elsewhere classified  Localized edema  Difficulty in walking, not elsewhere classified     Problem List Patient Active Problem List   Diagnosis Date Noted  . Primary osteoarthritis of right knee 10/13/2018  . Chronic narcotic use 06/12/2016  . Encounter for chronic pain management 06/12/2016  . Bilateral sciatica 02/10/2016  . Spinal stenosis of lumbar region without neurogenic claudication 02/10/2016  . Gastroesophageal reflux disease without esophagitis 01/30/2016  . Chronic  low  back pain 10/12/2015  . Unstable angina ([place]) 05/04/2015  . CAD (coronary artery disease) 06/25/2014  . Chest pain 06/25/2014  . Paresthesia of both feet 03/11/2014  . Paresthesia of skin 03/11/2014  . Health maintenance examination 11/12/2013  . Acute exacerbation of chronic obstructive pulmonary disease (COPD) ([place]) 08/13/2013  . Arthritis of knee, degenerative 04/10/2013  . H/O Spinal surgery 03/11/2013  . Degenerative disc disease, lumbar 03/11/2013  . Degenerative arthritis of right knee 03/11/2013  . Anxiety state 03/03/2013  . COPD (chronic obstructive pulmonary disease) ([place]) 03/03/2013  . Generalized anxiety disorder 03/03/2013  . Osteoarthritis of knee 06/30/2012  . Right knee pain 06/26/2012  . Colon cancer screening 02/21/2012  . Breast cancer screening 02/21/2012  . Prediabetes 11/09/2010  . Hyperlipidemia 11/09/2010  . TOBACCO ABUSE 11/09/2010  . BACK PAIN, LUMBAR, CHRONIC 11/09/2010  . HEMATURIA, MICROSCOPIC, HX OF 11/09/2010       Geraldine Solar PT, DPT    [place] [address] [place], [place], [address] Phone: [phone]   Fax:  [phone]  Name: Brenda [name] MRN: [id number] Date of Birth: May 21, 1956

## 2018-12-03 ENCOUNTER — Ambulatory Visit (HOSPITAL_COMMUNITY): Payer: Medicare Other

## 2018-12-03 ENCOUNTER — Encounter (HOSPITAL_COMMUNITY): Payer: Self-pay

## 2018-12-03 DIAGNOSIS — R600 Localized edema: Secondary | ICD-10-CM

## 2018-12-03 DIAGNOSIS — M25661 Stiffness of right knee, not elsewhere classified: Secondary | ICD-10-CM

## 2018-12-03 DIAGNOSIS — M25561 Pain in right knee: Secondary | ICD-10-CM

## 2018-12-03 DIAGNOSIS — R262 Difficulty in walking, not elsewhere classified: Secondary | ICD-10-CM | POA: Diagnosis not present

## 2018-12-03 NOTE — Therapy (Signed)
[place], [place], [address] Phone: [phone]   Fax:  [phone]  Physical Therapy Treatment  Patient Details  Name: Brenda [name] MRN: [id number] Date of Birth: [birth year] Referring Provider (PT): Edmonia Lynch, MD   Encounter Date: 12/03/2018  PT End of Session - 12/03/18 1112    Visit Number  8    Number of Visits  19    Date for PT Re-Evaluation  12/22/18   Minireassess 12/01/18   Authorization Type  Medicare    Authorization Time Period  11/10/18 top 12/22/18    Authorization - Visit Number  8    Authorization - Number of Visits  10    PT Start Time  1638    PT Stop Time  1151    PT Time Calculation (min)  43 min    Activity Tolerance  Patient limited by pain;Patient tolerated treatment well    Behavior During Therapy  [facility] for tasks assessed/performed       Past Medical History:  Diagnosis Date  . Aortic valve stenosis    "very mild"  . Atypical chest pain 2007; 2015   cardiolyte neg, echo nl, cath showed mild/nonobstructive LAD disease  . Bilateral primary osteoarthritis of knee 06/30/2012   Right >L.  Right unicompartmental knee arthroplasty 10/25/18  . CAD (coronary artery disease)   . Chronic LBP    Multiple surgeries  . COPD (chronic obstructive pulmonary disease) ([place])   . DDD (degenerative disc disease)    spinal stenosis  . Fatty liver   . History of GI bleed    NSAIDS  . Hyperlipidemia    mixed  . Microscopic hematuria    full w/ u unrevealing X 2  . Normal nuclear stress test 11/11 and 06/2014   negative, EF normal  . Palpitations 2006   48H holter neg  . PONV (postoperative nausea and vomiting)    "never threw up but felt sick on my stomach", also believes that she was aware of surgery during her hysterectomy   . Pre-diabetes   . Prediabetes    Highest A1c 6.1% as of 03/2017  . RBBB (right bundle branch block)   . Recurrent UTI   . Tobacco dependence in remission    Quit  fall 2015.  Only occasional bronchitis illness (CT shest 2009 nl- for f/u of ? nodule on CXR)    Past Surgical History:  Procedure Laterality Date  . ABDOMINAL HYSTERECTOMY  1997   DUB  . APPENDECTOMY  1984  . CARDIAC CATHETERIZATION  10/09/2005   no CAD, mildly elevated LVEDP, normal LV function (Dr. Gerrie Nordmann)  . CARDIAC CATHETERIZATION N/A 05/16/2015   Mild non-obstructive CAD--med mgmt.  Procedure: Left Heart Cath and Coronary Angiography;  Surgeon: Pixie Casino, MD;  Location: [place] [facility];  Service: Cardiovascular;  Laterality: N/A;  . CARDIOVASCULAR STRESS TEST  06/2014   normal lexiscan NST  . CARDIOVASCULAR STRESS TEST  2006   persantine - no ischemia, low risk   . KNEE ARTHROSCOPY Bilateral   . left wrist ganglion cyst excision  2010  . [place]   right iliac crest bone graft+metal instrumentation; 2005 metal removal and decompression, 2011 lumbar decompression 4-11, then stabilization/ instrumentation done 09-19-10: L2,L3, L5 left and L2 , L3, L4 right pedical remnant L4 left embedded. Left iliac crest bone graft-- Dr Velna Ochs  . LUMBAR SPINE SURGERY  02/10/2016   Dr. Velna Ochs: lumbar decompression, instrumentation  removal, and fusion exploration--HELPED her a lot, esp her radicular leg pains.  . OOPHORECTOMY Right    cyst  . PARTIAL KNEE ARTHROPLASTY Right 11/04/2018   Procedure: UNICOMPARTMENTAL KNEE;  Surgeon: Renette Butters, MD;  Location: [facility];  Service: Orthopedics;  Laterality: Right;  Adductor Block  . TONSILLECTOMY  63 yrs old  . TONSILLECTOMY    . TRANSTHORACIC ECHOCARDIOGRAM  2006   EF=>55%, trace MR, mild TR, trace AV regurg, trace pulm valve regurg     There were no vitals filed for this visit.  Subjective Assessment - 12/03/18 1112    Subjective  Pt states that she is having some increased soreness in the knee which she attributes to the walking at the last session. No pain, just soreness today.     Patient Stated Goals  get back to  where she can move her knee independently     Currently in Pain?  No/denies    Pain Onset  1 to 4 weeks ago          [facility] Adult PT Treatment/Exercise - 12/03/18 0001      Knee/Hip Exercises: Stretches   Passive Hamstring Stretch  Right;3 reps;30 seconds    Passive Hamstring Stretch Limitations  Standing on 8'' step    Knee: Self-Stretch to increase Flexion  Right;5 reps;10 seconds    Knee: Self-Stretch Limitations  standing, 12" step    Gastroc Stretch  Both;3 reps;30 seconds    Gastroc Stretch Limitations  slant board      Knee/Hip Exercises: Aerobic   Stationary Bike  x37mns mins seat 12, 1/2 and full revolutions for ROM      Knee/Hip Exercises: Standing   Heel Raises  Both;15 reps    Heel Raises Limitations  heel and toe    Knee Flexion  Right;15 reps    Knee Flexion Limitations  min cues to reduce hip flexion    Terminal Knee Extension  Right;10 reps    Theraband Level (Terminal Knee Extension)  Level 2 (Red)    Terminal Knee Extension Limitations  10"' holds    Rocker Board  2 minutes    Rocker Board Limitations  R/L, A/P      Knee/Hip Exercises: Supine   Quad Sets  Right;15 reps    QTarget CorporationLimitations  3-5" holds    Short Arc [employer] Right;2 sets;10 reps    Short Arc Quad Sets Limitations  2#    Heel Slides  Right;15 reps    Heel Slides Limitations  3-5" holds    Bridges  Both;2 sets;10 reps    Bridges Limitations  2"  hold    Straight Leg Raises  Right;2 sets;10 reps    Knee Extension Limitations  4    Knee Flexion Limitations  104      Knee/Hip Exercises: Sidelying   Clams  RLE 2x10 reps           PT Education - 12/03/18 1112    Education Details  exercise technique, continue HEP    Person(s) Educated  Patient    Methods  Explanation;Demonstration    Comprehension  Verbalized understanding;Returned demonstration       PT Short Term Goals - 12/01/18 1124      PT SHORT TERM GOAL #1   Title  Pt will have reduced edema by 2cm or > at joint  line in order to reduce pain and improve ROM.     Time  3    Period  Weeks    Status  Achieved      PT SHORT TERM GOAL #2   Title  Pt will have improved R knee AROM from 8-90deg in order to reduce pain and improve sitting tolerance.     Baseline  1/27: 5-103deg    Time  3    Period  Weeks    Status  Achieved      PT SHORT TERM GOAL #3   Title  Pt will have improved MMT to 4/5 throughout all mm groups in order to maximize balance, gait, and return to PLOF.    Baseline  1/27: see MMT    Time  3    Period  Weeks    Status  Partially Met      PT SHORT TERM GOAL #4   Title  Pt will be able to perform 5xSTS in 15sec or < without UE and with proper form to demo improved functional strength and balance    Baseline  1/27: 9.6sec, RLE extended    Time  3    Period  Weeks    Status  Partially Met        PT Long Term Goals - 12/01/18 1125      PT LONG TERM GOAL #1   Title  Pt will have improved R knee AROM from 0-115deg or better in order to further reduce pain, maximize gait, and stair ambulation.     Baseline  1/27: 5-103deg    Time  6    Period  Weeks    Status  On-going      PT LONG TERM GOAL #2   Title  Pt will have improved MMT to 4+/5 throughout all mm groups in order to further maximize balance, gait, and tolerance to functional activities.     Baseline  1/27: see MMT    Time  6    Period  Weeks    Status  Partially Met      PT LONG TERM GOAL #3   Title  Pt will be able to perform bil SLS for 15sec or > without UE support to demo improved balance and functional strength to allow her to navigate stairs and ambulate on uneven ground with greater ease.     Baseline  1/27: L: 28sec, R: 4.5sec    Time  6    Period  Weeks    Status  Partially Met      PT LONG TERM GOAL #4   Title  Pt will be able to ambulate 353f or > during 3MWT without AD and gait WFL in order to demo improved functional mobility and maximize return to PLOF.     Baseline  1/27: 6928f no AD, gait  deviations continue    Time  6    Period  Weeks    Status  Partially Met            Plan - 12/03/18 1150    Clinical Impression Statement  Resumed stationary bike for ROM this date with good tolerance. Continued with standing activities to improve ROM; min cues for form required. Pt denying any pain during therex. Added bridging and clams for glute stretch to maximize gait and balance. Continued to hold manual therapy due to possible infection. She has f/u with him on Monday 12/08/18 and will resume manual therapy once cleared by MD. AROM 4 to 104deg this date. Pt conitues to be tearful during AROM measurements but does not show signs of distress during any  previous therex activities.     Rehab Potential  Good    PT Frequency  3x / week    PT Duration  6 weeks    PT Treatment/Interventions  ADLs/Self Care Home Management;Aquatic Therapy;Cryotherapy;Electrical Stimulation;DME Instruction;Gait training;Stair training;Functional mobility training;Therapeutic activities;Therapeutic exercise;Balance training;Neuromuscular re-education;Patient/family education;Manual techniques;Manual lymph drainage;Scar mobilization;Passive range of motion;Dry needling;Energy conservation;Taping    PT Next Visit Plan  F/U with MD apt. Monitor for signs of infection at each visit. conintue to address ROM and strengthen when normalized    PT Home Exercise Plan  eval: supine heel slide, quad set, supine HS stretch with rope; 1/27: SAQ    Consulted and Agree with Plan of Care  Patient       Patient will benefit from skilled therapeutic intervention in order to improve the following deficits and impairments:  Abnormal gait, Decreased activity tolerance, Decreased balance, Decreased mobility, Decreased range of motion, Decreased scar mobility, Decreased strength, Difficulty walking, Hypomobility, Increased edema, Increased fascial restricitons, Increased muscle spasms, Impaired flexibility, Improper body mechanics,  Postural dysfunction, Pain  Visit Diagnosis: Acute pain of right knee  Stiffness of right knee, not elsewhere classified  Localized edema  Difficulty in walking, not elsewhere classified     Problem List Patient Active Problem List   Diagnosis Date Noted  . Primary osteoarthritis of right knee 10/13/2018  . Chronic narcotic use 06/12/2016  . Encounter for chronic pain management 06/12/2016  . Bilateral sciatica 02/10/2016  . Spinal stenosis of lumbar region without neurogenic claudication 02/10/2016  . Gastroesophageal reflux disease without esophagitis 01/30/2016  . Chronic low back pain 10/12/2015  . Unstable angina ([place]) 05/04/2015  . CAD (coronary artery disease) 06/25/2014  . Chest pain 06/25/2014  . Paresthesia of both feet 03/11/2014  . Paresthesia of skin 03/11/2014  . Health maintenance examination 11/12/2013  . Acute exacerbation of chronic obstructive pulmonary disease (COPD) ([place]) 08/13/2013  . Arthritis of knee, degenerative 04/10/2013  . H/O Spinal surgery 03/11/2013  . Degenerative disc disease, lumbar 03/11/2013  . Degenerative arthritis of right knee 03/11/2013  . Anxiety state 03/03/2013  . COPD (chronic obstructive pulmonary disease) ([place]) 03/03/2013  . Generalized anxiety disorder 03/03/2013  . Osteoarthritis of knee 06/30/2012  . Right knee pain 06/26/2012  . Colon cancer screening 02/21/2012  . Breast cancer screening 02/21/2012  . Prediabetes 11/09/2010  . Hyperlipidemia 11/09/2010  . TOBACCO ABUSE 11/09/2010  . BACK PAIN, LUMBAR, CHRONIC 11/09/2010  . HEMATURIA, MICROSCOPIC, HX OF 11/09/2010        Geraldine Solar PT, DPT   [facility] [address] [place], [place], [address] Phone: [phone]   Fax:  [phone]  Name: Brenda [name] MRN: [id number] Date of Birth: 01-21-1956

## 2018-12-05 ENCOUNTER — Telehealth (HOSPITAL_COMMUNITY): Payer: Self-pay

## 2018-12-05 ENCOUNTER — Ambulatory Visit (HOSPITAL_COMMUNITY): Payer: Medicare Other

## 2018-12-05 NOTE — Telephone Encounter (Signed)
She is in pain and will not come today she will try for next week.

## 2018-12-08 ENCOUNTER — Telehealth (HOSPITAL_COMMUNITY): Payer: Self-pay

## 2018-12-08 DIAGNOSIS — M1711 Unilateral primary osteoarthritis, right knee: Secondary | ICD-10-CM | POA: Diagnosis not present

## 2018-12-08 NOTE — Telephone Encounter (Signed)
Pt reports she saw her MD today and He said we did such a good job she doesn't have to come back, please D/c toda

## 2018-12-10 ENCOUNTER — Ambulatory Visit (HOSPITAL_COMMUNITY): Payer: Medicare Other | Admitting: Physical Therapy

## 2018-12-11 ENCOUNTER — Ambulatory Visit: Payer: Medicare Other | Admitting: Family Medicine

## 2018-12-11 ENCOUNTER — Other Ambulatory Visit: Payer: Self-pay | Admitting: *Deleted

## 2018-12-11 ENCOUNTER — Encounter: Payer: Self-pay | Admitting: *Deleted

## 2018-12-12 ENCOUNTER — Ambulatory Visit (HOSPITAL_COMMUNITY): Payer: Medicare Other | Admitting: Physical Therapy

## 2018-12-15 ENCOUNTER — Ambulatory Visit: Payer: Medicare Other | Admitting: Family Medicine

## 2018-12-17 ENCOUNTER — Ambulatory Visit (HOSPITAL_COMMUNITY): Payer: Medicare Other

## 2018-12-17 ENCOUNTER — Encounter: Payer: Self-pay | Admitting: *Deleted

## 2018-12-17 ENCOUNTER — Ambulatory Visit (INDEPENDENT_AMBULATORY_CARE_PROVIDER_SITE_OTHER): Payer: Medicare Other | Admitting: Family Medicine

## 2018-12-17 ENCOUNTER — Encounter: Payer: Self-pay | Admitting: Family Medicine

## 2018-12-17 VITALS — BP 144/83 | HR 85 | Temp 98.1°F | Resp 16 | Ht 64.0 in | Wt 187.0 lb

## 2018-12-17 DIAGNOSIS — E669 Obesity, unspecified: Secondary | ICD-10-CM | POA: Diagnosis not present

## 2018-12-17 DIAGNOSIS — E860 Dehydration: Secondary | ICD-10-CM

## 2018-12-17 DIAGNOSIS — M25561 Pain in right knee: Secondary | ICD-10-CM | POA: Diagnosis not present

## 2018-12-17 DIAGNOSIS — R748 Abnormal levels of other serum enzymes: Secondary | ICD-10-CM

## 2018-12-17 DIAGNOSIS — M25562 Pain in left knee: Secondary | ICD-10-CM | POA: Diagnosis not present

## 2018-12-17 DIAGNOSIS — Z96659 Presence of unspecified artificial knee joint: Secondary | ICD-10-CM | POA: Diagnosis not present

## 2018-12-17 DIAGNOSIS — G894 Chronic pain syndrome: Secondary | ICD-10-CM

## 2018-12-17 DIAGNOSIS — M170 Bilateral primary osteoarthritis of knee: Secondary | ICD-10-CM | POA: Diagnosis not present

## 2018-12-17 DIAGNOSIS — K529 Noninfective gastroenteritis and colitis, unspecified: Secondary | ICD-10-CM

## 2018-12-17 DIAGNOSIS — M545 Low back pain: Secondary | ICD-10-CM

## 2018-12-17 DIAGNOSIS — G8929 Other chronic pain: Secondary | ICD-10-CM

## 2018-12-17 MED ORDER — OXYCODONE HCL 5 MG PO TABS: ORAL_TABLET | ORAL | 0 refills | Status: DC

## 2018-12-17 NOTE — Progress Notes (Signed)
OFFICE VISIT  12/17/2018   CC:  Chief Complaint  Patient presents with  . Follow-up    RCI, pt is fasting.   HPI:    Patient is a 63 y.o. Caucasian female who presents for 3 mo f/u chronic pain syndrome: chronic LBP w/out radiation (multilevel DDD, +hx of lumbar spine surgery), chronic bilat knee pain from advanced DJD.  Indication for chronic opioid: chronic bilat LBP w/out sciatica, chronic pain from bilat osteoarthritis of knees. Opioids rx'd to improve functioning and quality of life. Medication and dose: oxycodone 5mg , 1-2 tid prn. # pills per month: #180 Last UDS date: 12/05/17 (appropriate results). Opioid Treatment Agreement signed (Y/N): 03/14/17 Opioid Treatment Agreement last reviewed with patient:  today. [place] reviewed this encounter (include red flags):  yes, no red flags.  She underwent partial right knee arthroplasty 11/04/2018.  At this point she says it feels "ok"--->MUCH better than before surgery.  She has finished with formal rehab.  There was worry in the post op period that R knee may have been infected. Pt took abx, everything calmed down.  Most recent f/u with ortho was good. Has f/u with ortho again 01/12/2019.  Her use of oxycodone is less than I saw her last visit.  Back and R knee hurting a bit more lately. Most recent oxycodone was this morning.  Lorazepam yesterday.  Had significant dehydration from viral GE about 2 wks ago, went to ED and got IVF, labs. Lipase in 90s-->due to vomiting, low suspicion of pancreatitis as CAUSE of her illness. Feeling no pain and no n/w since that illness resolved (after 3-4d).  Chronic anxiety: on alprazolam long term w/out problems, says it is still helpful.  ROS: no CP, no SOB, no wheezing, no cough, no dizziness, no HAs, no rashes, no melena/hematochezia.  No polyuria or polydipsia.   Past Medical History:  Diagnosis Date  . Aortic valve stenosis    "very mild"  . Atypical chest pain 2007; 2015   cardiolyte  neg, echo nl, cath showed mild/nonobstructive LAD disease  . Bilateral primary osteoarthritis of knee 06/30/2012   Right >L.  Right unicompartmental knee arthroplasty 10/25/18  . CAD (coronary artery disease)   . Chronic LBP    Multiple surgeries  . COPD (chronic obstructive pulmonary disease) ([place])   . DDD (degenerative disc disease)    spinal stenosis  . Fatty liver   . History of GI bleed    NSAIDS  . Hyperlipidemia    mixed  . Microscopic hematuria    full w/ u unrevealing X 2  . Normal nuclear stress test 11/11 and 06/2014   negative, EF normal  . Palpitations 2006   48H holter neg  . PONV (postoperative nausea and vomiting)    "never threw up but felt sick on my stomach", also believes that she was aware of surgery during her hysterectomy   . Pre-diabetes   . Prediabetes    Highest A1c 6.1% as of 03/2017  . RBBB (right bundle branch block)   . Recurrent UTI   . Tobacco dependence in remission    Quit fall 2015.  Only occasional bronchitis illness (CT shest 2009 nl- for f/u of ? nodule on CXR)    Past Surgical History:  Procedure Laterality Date  . ABDOMINAL HYSTERECTOMY  1997   DUB  . APPENDECTOMY  1984  . CARDIAC CATHETERIZATION  10/09/2005   no CAD, mildly elevated LVEDP, normal LV function (Dr. Gerrie Nordmann)  . CARDIAC CATHETERIZATION N/A 05/16/2015  Mild non-obstructive CAD--med mgmt.  Procedure: Left Heart Cath and Coronary Angiography;  Surgeon: Pixie Casino, MD;  Location: [facility];  Service: Cardiovascular;  Laterality: N/A;  . CARDIOVASCULAR STRESS TEST  06/2014   normal lexiscan NST  . CARDIOVASCULAR STRESS TEST  2006   persantine - no ischemia, low risk   . KNEE ARTHROSCOPY Bilateral   . left wrist ganglion cyst excision  2010  . [place]   right iliac crest bone graft+metal instrumentation; 2005 metal removal and decompression, 2011 lumbar decompression 4-11, then stabilization/ instrumentation done 09-19-10: L2,L3, L5 left and  L2 , L3, L4 right pedical remnant L4 left embedded. Left iliac crest bone graft-- Dr Velna Ochs  . LUMBAR SPINE SURGERY  02/10/2016   Dr. Velna Ochs: lumbar decompression, instrumentation removal, and fusion exploration--HELPED her a lot, esp her radicular leg pains.  . OOPHORECTOMY Right    cyst  . PARTIAL KNEE ARTHROPLASTY Right 11/04/2018   Procedure: UNICOMPARTMENTAL KNEE;  Surgeon: Renette Butters, MD;  Location: [facility];  Service: Orthopedics;  Laterality: Right;  Adductor Block  . TONSILLECTOMY  63 yrs old  . TONSILLECTOMY    . TRANSTHORACIC ECHOCARDIOGRAM  2006   EF=>55%, trace MR, mild TR, trace AV regurg, trace pulm valve regurg     Outpatient Medications Prior to Visit  Medication Sig Dispense Refill  . acetaminophen (TYLENOL) 500 MG tablet every 6 (six) hours as needed for mild pain or headache.    . albuterol (PROVENTIL HFA;VENTOLIN HFA) 108 (90 Base) MCG/ACT inhaler Inhale 2 puffs into the lungs every 4 (four) hours as needed for wheezing or shortness of breath (bronchitis). Reported on 03/12/2016 1 Inhaler 2  . clopidogrel (PLAVIX) 75 MG tablet Take 1 tablet (75 mg total) by mouth daily. 90 tablet 3  . cyclobenzaprine (FLEXERIL) 10 MG tablet take 1 tablet by mouth every 8 hours if needed (Patient taking differently: Take 10 mg by mouth 3 (three) times daily as needed for muscle spasms. ) 30 tablet 6  . docusate sodium (COLACE) 100 MG capsule Take 1 capsule (100 mg total) by mouth 2 (two) times daily. To prevent constipation while taking pain medication. 60 capsule 0  . fenofibrate 160 MG tablet take 1 tablet by mouth once daily with food (Patient taking differently: Take 160 mg by mouth daily. ) 90 tablet 3  . gabapentin (NEURONTIN) 300 MG capsule TK 1 C PO BID PRF PAIN    . LORazepam (ATIVAN) 1 MG tablet Take 1 tablet (1 mg total) by mouth 2 (two) times daily. 60 tablet 5  . nitroGLYCERIN (NITROSTAT) 0.4 MG SL tablet Place 1 tablet (0.4 mg total) under the tongue every 5 (five) minutes as  needed for chest pain (MAX 3 doses.). 25 tablet 3  . pantoprazole (PROTONIX) 40 MG tablet Take 1 tablet (40 mg total) by mouth daily. 90 tablet 3  . rosuvastatin (CRESTOR) 40 MG tablet Take 1 tablet (40 mg total) by mouth daily. 90 tablet 3  . sodium chloride (OCEAN) 0.65 % SOLN nasal spray Place 1 spray into both nostrils as needed for congestion.    Marland Kitchen oxyCODONE (OXY IR/ROXICODONE) 5 MG immediate release tablet Take 5-10 mg by mouth every 6 (six) hours as needed for severe pain.    Marland Kitchen aspirin EC 81 MG tablet Take 1 tablet (81 mg total) by mouth 2 (two) times daily. For DVT prophylaxis for 30 days after surgery. (Patient not taking: Reported on 12/17/2018) 60 tablet 0  .  loperamide (IMODIUM) 2 MG capsule Take 1 capsule (2 mg total) by mouth 4 (four) times daily as needed for diarrhea or loose stools. (Patient not taking: Reported on 12/17/2018) 12 capsule 0  . ondansetron (ZOFRAN ODT) 4 MG disintegrating tablet Take 1 tablet (4 mg total) by mouth every 8 (eight) hours as needed for nausea or vomiting. (Patient not taking: Reported on 12/17/2018) 20 tablet 0  . ondansetron (ZOFRAN) 4 MG tablet Take 1 tablet (4 mg total) by mouth every 8 (eight) hours as needed for nausea or vomiting. (Patient not taking: Reported on 12/17/2018) 20 tablet 0   No facility-administered medications prior to visit.     Allergies  Allergen Reactions  . Aspirin Other (See Comments)     GI Bleed  . Beta Adrenergic Blockers Other (See Comments)    REACTION: decreased libido  . Nsaids Other (See Comments)     GI Upset  . Penicillins Swelling and Other (See Comments)    DID THE REACTION INVOLVE: Swelling of the face/tongue/throat, SOB, or low BP? Yes Sudden or severe rash/hives, skin peeling, or the inside of the mouth or nose? Yes Did it require medical treatment? Yes When did it last happen? 63 years old If all above answers are "NO", may proceed with cephalosporin use.   Marland Kitchen Prochlorperazine Edisylate Other (See Comments)      Stroke like symptoms  . Sulfonamide Derivatives Other (See Comments)    Unknown allergic reaction  . Amitriptyline Hcl Palpitations and Other (See Comments)     increased heart rate    ROS As per HPI  PE: Blood pressure (!) 144/83, pulse 85, temperature 98.1 F (36.7 C), temperature source Oral, resp. rate 16, height 5\' 4"  (1.626 m), weight 187 lb (84.8 kg), SpO2 96 %. Gen: Alert, well appearing.  Patient is oriented to person, place, time, and situation. AFFECT: pleasant, lucid thought and speech. No further exam today.  LABS:  Lab Results  Component Value Date   LIPASE 97 (H) 11/26/2018    Lab Results  Component Value Date   TSH 0.807 05/10/2015   Lab Results  Component Value Date   WBC 15.9 (H) 11/26/2018   HGB 15.1 (H) 11/26/2018   HCT 45.3 11/26/2018   MCV 90.4 11/26/2018   PLT 393 11/26/2018   Lab Results  Component Value Date   CREATININE 0.65 11/26/2018   BUN 17 11/26/2018   NA 139 11/26/2018   K 3.4 (L) 11/26/2018   CL 106 11/26/2018   CO2 21 (L) 11/26/2018   Lab Results  Component Value Date   ALT 12 11/26/2018   AST 14 (L) 11/26/2018   ALKPHOS 60 11/26/2018   BILITOT 1.1 11/26/2018   Lab Results  Component Value Date   CHOL 165 06/05/2018   Lab Results  Component Value Date   HDL 54.70 06/05/2018   Lab Results  Component Value Date   LDLCALC 91 06/05/2018   Lab Results  Component Value Date   TRIG 96.0 06/05/2018   Lab Results  Component Value Date   CHOLHDL 3 06/05/2018   Lab Results  Component Value Date   HGBA1C 5.9 06/05/2018    IMPRESSION AND PLAN:  1) chronic pain syndrome: chronic LBP w/out radiation, chronic bilat knee osteoarthritis. R knee improved since getting partial knee arthroplasty 11/04/2018.  Requiring a bit less pain meds but she is only 5-6 wks out from surgery, so hopefully she will feel even more improvement in R knee in near future to allow  less use of oxycodone.  I did electronic rx's for oxycodone  5mg , 1-2 tid prn, #180 today for Feb, Mar, and April 2020.  Appropriate fill on/after date was noted on each rx. Controlled substances contract updated today.  UDS obtained today.  2) Acute GE with dehydration: pt describes very severe sx's.  She got IVF and improved a lot with this. Lipase was up SLIGHTLY at that time, likely secondary to her vomiting.  She became asymptomatic shortly after ED visit and felt back to normal in 2-3 days.   Will check BMET lipase, and A1c at next f/u visit in 3 mo.  3) Chronic anxiety: she is stable on long term lorazepam use. Controlled substance contract updated today.  UDS today.  An After Visit Summary was printed and given to the patient.  FOLLOW UP: Return in about 3 months (around 03/17/2019) for routine chronic illness f/u.  Signed:  Crissie Sickles, MD           12/17/2018

## 2018-12-19 ENCOUNTER — Ambulatory Visit (HOSPITAL_COMMUNITY): Payer: Medicare Other

## 2018-12-21 LAB — PAIN MGMT, PROFILE 8 W/CONF, U
6 Acetylmorphine: NEGATIVE ng/mL (ref <10)
Alcohol Metabolites: NEGATIVE ng/mL (ref <500)
Alphahydroxyalprazolam: NEGATIVE ng/mL (ref <25)
Alphahydroxymidazolam: NEGATIVE ng/mL (ref <50)
Alphahydroxytriazolam: NEGATIVE ng/mL (ref <50)
Aminoclonazepam: NEGATIVE ng/mL (ref <25)
Amphetamines: NEGATIVE ng/mL (ref <500)
Benzodiazepines: POSITIVE ng/mL — AB (ref <100)
Buprenorphine, Urine: NEGATIVE ng/mL (ref <5)
Cocaine Metabolite: NEGATIVE ng/mL (ref <150)
Codeine: NEGATIVE ng/mL (ref <50)
Creatinine: 81.4 mg/dL
Hydrocodone: NEGATIVE ng/mL (ref <50)
Hydromorphone: NEGATIVE ng/mL (ref <50)
Hydroxyethylflurazepam: NEGATIVE ng/mL (ref <50)
Lorazepam: 938 ng/mL — ABNORMAL HIGH (ref <50)
MDMA: NEGATIVE ng/mL (ref <500)
Marijuana Metabolite: NEGATIVE ng/mL (ref <20)
Morphine: NEGATIVE ng/mL (ref <50)
Nordiazepam: NEGATIVE ng/mL (ref <50)
Norhydrocodone: NEGATIVE ng/mL (ref <50)
Noroxycodone: 1798 ng/mL — ABNORMAL HIGH (ref <50)
Opiates: NEGATIVE ng/mL (ref <100)
Oxazepam: NEGATIVE ng/mL (ref <50)
Oxidant: NEGATIVE ug/mL (ref <200)
Oxycodone: 1264 ng/mL — ABNORMAL HIGH (ref <50)
Oxycodone: POSITIVE ng/mL — AB (ref <100)
Oxymorphone: 3068 ng/mL — ABNORMAL HIGH (ref <50)
Temazepam: NEGATIVE ng/mL (ref <50)
pH: 5.71 (ref 4.5–9.0)

## 2018-12-22 ENCOUNTER — Ambulatory Visit (HOSPITAL_COMMUNITY): Payer: Medicare Other

## 2018-12-24 ENCOUNTER — Ambulatory Visit (HOSPITAL_COMMUNITY): Payer: Medicare Other | Admitting: Physical Therapy

## 2018-12-26 ENCOUNTER — Ambulatory Visit (HOSPITAL_COMMUNITY): Payer: Medicare Other

## 2018-12-29 ENCOUNTER — Ambulatory Visit (HOSPITAL_COMMUNITY): Payer: Medicare Other

## 2018-12-31 ENCOUNTER — Ambulatory Visit (HOSPITAL_COMMUNITY): Payer: Medicare Other

## 2019-01-02 ENCOUNTER — Ambulatory Visit (HOSPITAL_COMMUNITY): Payer: Medicare Other | Admitting: Physical Therapy

## 2019-01-12 DIAGNOSIS — M1711 Unilateral primary osteoarthritis, right knee: Secondary | ICD-10-CM | POA: Diagnosis not present

## 2019-01-21 ENCOUNTER — Encounter (HOSPITAL_COMMUNITY): Payer: Self-pay

## 2019-01-21 NOTE — Therapy (Signed)
[facility] [place], [place], [address] Phone: [phone]   Fax:  [phone]  Patient Details  Name: Brenda [name] MRN: [id number] Date of Birth: December 18, 1955 Referring Provider:  No ref. provider found  Encounter Date: 01/21/2019   Pt called on 12/08/18 and reports she saw her MD today and He said we did such a good job she doesn't have to come back, please D/c toda  PHYSICAL THERAPY DISCHARGE SUMMARY  Visits from Start of Care: 8  Current functional level related to goals / functional outcomes: See last note   Remaining deficits: See last note   Education / Equipment: n/a  Plan: Patient agrees to discharge.  Patient goals were partially met. Patient is being discharged due to the patient's request.  ?????      Geraldine Solar PT, [place] [address] [place], [place], [address] Phone: [phone]   Fax:  [phone]

## 2019-01-23 NOTE — Telephone Encounter (Signed)
PT UNDERSTANDS THIS APPTMENT 11/28/2018 IS CX DUE TO [place] NOT IN THE OFFICE TODAY   Outgoing call

## 2019-03-09 ENCOUNTER — Telehealth: Payer: Self-pay | Admitting: *Deleted

## 2019-03-09 NOTE — Telephone Encounter (Signed)
03/09/19 LMOM @ 10:34 am,re: follow up appointment.

## 2019-03-19 ENCOUNTER — Ambulatory Visit: Payer: Medicare Other | Admitting: Family Medicine

## 2019-03-19 ENCOUNTER — Encounter: Payer: Self-pay | Admitting: Family Medicine

## 2019-03-19 ENCOUNTER — Ambulatory Visit (INDEPENDENT_AMBULATORY_CARE_PROVIDER_SITE_OTHER): Payer: Medicare Other | Admitting: Family Medicine

## 2019-03-19 VITALS — BP 130/91 | HR 72 | Temp 98.1°F | Ht 64.0 in | Wt 188.0 lb

## 2019-03-19 DIAGNOSIS — M545 Low back pain: Secondary | ICD-10-CM

## 2019-03-19 DIAGNOSIS — G894 Chronic pain syndrome: Secondary | ICD-10-CM | POA: Diagnosis not present

## 2019-03-19 DIAGNOSIS — F411 Generalized anxiety disorder: Secondary | ICD-10-CM | POA: Diagnosis not present

## 2019-03-19 DIAGNOSIS — G8929 Other chronic pain: Secondary | ICD-10-CM | POA: Diagnosis not present

## 2019-03-19 DIAGNOSIS — M170 Bilateral primary osteoarthritis of knee: Secondary | ICD-10-CM | POA: Diagnosis not present

## 2019-03-19 MED ORDER — OXYCODONE HCL 5 MG PO TABS: ORAL_TABLET | ORAL | 0 refills | Status: DC

## 2019-03-19 MED ORDER — LORAZEPAM 1 MG PO TABS: 1.0000 mg | ORAL_TABLET | Freq: Two times a day (BID) | ORAL | 5 refills | Status: DC

## 2019-03-19 NOTE — Progress Notes (Signed)
Virtual Visit via Video Note  I connected with pt on 03/19/19 at  3:00 PM EDT by a video enabled telemedicine application and verified that I am speaking with the correct person using two identifiers.  Location patient: home Location provider:work or home office Persons participating in the virtual visit: patient, provider  I discussed the limitations of evaluation and management by telemedicine and the availability of in person appointments. The patient expressed understanding and agreed to proceed.  Telemedicine visit is a necessity given the COVID-19 restrictions in place at the current time.  HPI: 63 y/o WF here for 3 mo f/u chronic pain syndrome.  Indication for chronic opioid: chronic bilat LBP w/out sciatica, and chronic pain from bilat osteoarthritis knees. Medication and dose: oxycodone 5mg , 1-2 tid prn. # pills per month: #180 Last UDS date: 12/17/2018 (approp results) Opioid Treatment Agreement signed (Y/N): Y, 12/17/2018 Opioid Treatment Agreement last reviewed with patient:  today [place] reviewed this encounter (include red flags):  Y, no red flags.  She is about 5 mo out from R TKA. Pain is the same: intensity 7-8/10 before taking a pain pill.  Comes down to 3/10 after she takes 2 pain pills.  She takes 1 tab and repeats 1 tab if not signif improved in 30-60 min. No adverse side effects from the med.  Home bp's usually normal.  Ativan keeps anxiety at mild/moderate level, no panic.  ROS: See pertinent positives and negatives per HPI.  Past Medical History:  Diagnosis Date  . Aortic valve stenosis    "very mild"  . Atypical chest pain 2007; 2015   cardiolyte neg, echo nl, cath showed mild/nonobstructive LAD disease  . Bilateral primary osteoarthritis of knee 06/30/2012   Right >L.  Right unicompartmental knee arthroplasty 10/25/18  . CAD (coronary artery disease)   . Chronic LBP    Multiple surgeries  . COPD (chronic obstructive pulmonary disease) ([place])   . DDD  (degenerative disc disease)    spinal stenosis  . Fatty liver   . History of GI bleed    NSAIDS  . Hyperlipidemia    mixed  . Microscopic hematuria    full w/ u unrevealing X 2  . Normal nuclear stress test 11/11 and 06/2014   negative, EF normal  . Palpitations 2006   48H holter neg  . PONV (postoperative nausea and vomiting)    "never threw up but felt sick on my stomach", also believes that she was aware of surgery during her hysterectomy   . Pre-diabetes   . Prediabetes    Highest A1c 6.1% as of 03/2017  . RBBB (right bundle branch block)   . Recurrent UTI   . Tobacco dependence in remission    Quit fall 2015.  Only occasional bronchitis illness (CT shest 2009 nl- for f/u of ? nodule on CXR)    Past Surgical History:  Procedure Laterality Date  . ABDOMINAL HYSTERECTOMY  1997   DUB  . APPENDECTOMY  1984  . CARDIAC CATHETERIZATION  10/09/2005   no CAD, mildly elevated LVEDP, normal LV function (Dr. Gerrie Nordmann)  . CARDIAC CATHETERIZATION N/A 05/16/2015   Mild non-obstructive CAD--med mgmt.  Procedure: Left Heart Cath and Coronary Angiography;  Surgeon: Pixie Casino, MD;  Location: [facility];  Service: Cardiovascular;  Laterality: N/A;  . CARDIOVASCULAR STRESS TEST  06/2014   normal lexiscan NST  . CARDIOVASCULAR STRESS TEST  2006   persantine - no ischemia, low risk   . KNEE ARTHROSCOPY Bilateral   .  left wrist ganglion cyst excision  2010  . [place]   right iliac crest bone graft+metal instrumentation; 2005 metal removal and decompression, 2011 lumbar decompression 4-11, then stabilization/ instrumentation done 09-19-10: L2,L3, L5 left and L2 , L3, L4 right pedical remnant L4 left embedded. Left iliac crest bone graft-- Dr Velna Ochs  . LUMBAR SPINE SURGERY  02/10/2016   Dr. Velna Ochs: lumbar decompression, instrumentation removal, and fusion exploration--HELPED her a lot, esp her radicular leg pains.  . OOPHORECTOMY Right    cyst  . PARTIAL KNEE  ARTHROPLASTY Right 11/04/2018   Procedure: UNICOMPARTMENTAL KNEE;  Surgeon: Renette Butters, MD;  Location: [facility];  Service: Orthopedics;  Laterality: Right;  Adductor Block  . TONSILLECTOMY  63 yrs old  . TONSILLECTOMY    . TRANSTHORACIC ECHOCARDIOGRAM  2006   EF=>55%, trace MR, mild TR, trace AV regurg, trace pulm valve regurg     Family History  Problem Relation Age of Onset  . Heart Problems Mother        and thyroid problems  . Hyperlipidemia Mother   . Diabetes Mother   . Breast cancer Mother   . Heart failure Father        CHF, heart attack, diabetes, hyperlipidemia  . Heart disease Brother        back problems  . Hypertension Brother   . Kidney failure Maternal Grandmother   . Stroke Maternal Grandfather   . Cancer Paternal Grandmother        mouth - snuff  . Heart attack Paternal Grandfather        stroke, HTN  . Hyperlipidemia Brother   . Heart attack Brother   . Cancer Brother   . Coronary artery disease Neg Hx      Current Outpatient Medications:  .  acetaminophen (TYLENOL) 500 MG tablet, every 6 (six) hours as needed for mild pain or headache., Disp: , Rfl:  .  clopidogrel (PLAVIX) 75 MG tablet, Take 1 tablet (75 mg total) by mouth daily., Disp: 90 tablet, Rfl: 3 .  cyclobenzaprine (FLEXERIL) 10 MG tablet, take 1 tablet by mouth every 8 hours if needed (Patient taking differently: Take 10 mg by mouth 3 (three) times daily as needed for muscle spasms. ), Disp: 30 tablet, Rfl: 6 .  fenofibrate 160 MG tablet, take 1 tablet by mouth once daily with food (Patient taking differently: Take 160 mg by mouth daily. ), Disp: 90 tablet, Rfl: 3 .  LORazepam (ATIVAN) 1 MG tablet, Take 1 tablet (1 mg total) by mouth 2 (two) times daily., Disp: 60 tablet, Rfl: 5 .  oxyCODONE (OXY IR/ROXICODONE) 5 MG immediate release tablet, 1-2 po tid prn pain, Disp: 180 tablet, Rfl: 0 .  pantoprazole (PROTONIX) 40 MG tablet, Take 1 tablet (40 mg total) by mouth daily., Disp: 90 tablet, Rfl:  3 .  rosuvastatin (CRESTOR) 40 MG tablet, Take 1 tablet (40 mg total) by mouth daily., Disp: 90 tablet, Rfl: 3 .  sodium chloride (OCEAN) 0.65 % SOLN nasal spray, Place 1 spray into both nostrils as needed for congestion., Disp: , Rfl:  .  albuterol (PROVENTIL HFA;VENTOLIN HFA) 108 (90 Base) MCG/ACT inhaler, Inhale 2 puffs into the lungs every 4 (four) hours as needed for wheezing or shortness of breath (bronchitis). Reported on 03/12/2016 (Patient not taking: Reported on 03/19/2019), Disp: 1 Inhaler, Rfl: 2 .  nitroGLYCERIN (NITROSTAT) 0.4 MG SL tablet, Place 1 tablet (0.4 mg total) under the tongue every 5 (five) minutes as needed  for chest pain (MAX 3 doses.). (Patient not taking: Reported on 03/19/2019), Disp: 25 tablet, Rfl: 3  EXAM:  VITALS per patient if applicable:  GENERAL: alert, oriented, appears well and in no acute distress  HEENT: atraumatic, conjunttiva clear, no obvious abnormalities on inspection of external nose and ears  NECK: normal movements of the head and neck  LUNGS: on inspection no signs of respiratory distress, breathing rate appears normal, no obvious gross SOB, gasping or wheezing  CV: no obvious cyanosis  MS: moves all visible extremities without noticeable abnormality  PSYCH/NEURO: pleasant and cooperative, no obvious depression or anxiety, speech and thought processing grossly intact  LABS: none today    Chemistry      Component Value Date/Time   NA 139 11/26/2018 0913   K 3.4 (L) 11/26/2018 0913   CL 106 11/26/2018 0913   CO2 21 (L) 11/26/2018 0913   BUN 17 11/26/2018 0913   CREATININE 0.65 11/26/2018 0913   CREATININE 0.69 06/29/2011 1607      Component Value Date/Time   CALCIUM 9.6 11/26/2018 0913   ALKPHOS 60 11/26/2018 0913   AST 14 (L) 11/26/2018 0913   ALT 12 11/26/2018 0913   BILITOT 1.1 11/26/2018 0913     Lab Results  Component Value Date   CHOL 165 06/05/2018   HDL 54.70 06/05/2018   LDLCALC 91 06/05/2018   LDLDIRECT 172.2  07/14/2014   TRIG 96.0 06/05/2018   CHOLHDL 3 06/05/2018   ASSESSMENT AND PLAN:  Discussed the following assessment and plan:  1) Chronic pain syndrome: lumbar spondylosis/DDD + primary osteoarthritis of both knees. Hx of R TKA 10/2018.  Pain adequately controlled on current regimen. I did electronic rx's for oxycodone 5mg , 1-2 tid prn, #180 today for this month, June 2020, and July 2020.  Appropriate fill on/after date was noted on each rx. CSC and UDS are UTD.  2) Chronic anxiety d/o: stable on ativan 1 mg, 1 bid.   CSC and UDS UTD. RF'd this med today, #60, RF x 5.  I discussed the assessment and treatment plan with the patient. The patient was provided an opportunity to ask questions and all were answered. The patient agreed with the plan and demonstrated an understanding of the instructions.   The patient was advised to call back or seek an in-person evaluation if the symptoms worsen or if the condition fails to improve as anticipated.  F/u: 3 mo RCI  Signed:  Crissie Sickles, MD           03/19/2019

## 2019-04-10 ENCOUNTER — Other Ambulatory Visit: Payer: Self-pay | Admitting: Family Medicine

## 2019-04-10 NOTE — Telephone Encounter (Signed)
SW pt and she has enough until the next refill. Pt was given (60,5) on 03/19/19.

## 2019-06-02 ENCOUNTER — Emergency Department (HOSPITAL_COMMUNITY)
Admission: EM | Admit: 2019-06-02 | Discharge: 2019-06-02 | Disposition: A | Discharge status: Home / Self Care | Payer: Medicare Other | Attending: Emergency Medicine | Admitting: Emergency Medicine

## 2019-06-02 ENCOUNTER — Emergency Department (HOSPITAL_COMMUNITY): Payer: Medicare Other

## 2019-06-02 ENCOUNTER — Other Ambulatory Visit: Payer: Self-pay

## 2019-06-02 ENCOUNTER — Ambulatory Visit: Payer: Self-pay

## 2019-06-02 ENCOUNTER — Other Ambulatory Visit: Payer: Medicare Other

## 2019-06-02 ENCOUNTER — Encounter (HOSPITAL_COMMUNITY): Payer: Self-pay | Admitting: Emergency Medicine

## 2019-06-02 DIAGNOSIS — Z886 Allergy status to analgesic agent status: Secondary | ICD-10-CM | POA: Diagnosis not present

## 2019-06-02 DIAGNOSIS — I4510 Unspecified right bundle-branch block: Secondary | ICD-10-CM | POA: Diagnosis not present

## 2019-06-02 DIAGNOSIS — I2510 Atherosclerotic heart disease of native coronary artery without angina pectoris: Secondary | ICD-10-CM | POA: Insufficient documentation

## 2019-06-02 DIAGNOSIS — J449 Chronic obstructive pulmonary disease, unspecified: Secondary | ICD-10-CM | POA: Insufficient documentation

## 2019-06-02 DIAGNOSIS — Z20822 Contact with and (suspected) exposure to covid-19: Secondary | ICD-10-CM

## 2019-06-02 DIAGNOSIS — R0602 Shortness of breath: Secondary | ICD-10-CM | POA: Diagnosis not present

## 2019-06-02 DIAGNOSIS — Z79899 Other long term (current) drug therapy: Secondary | ICD-10-CM | POA: Diagnosis not present

## 2019-06-02 DIAGNOSIS — Z880 Allergy status to penicillin: Secondary | ICD-10-CM | POA: Diagnosis not present

## 2019-06-02 DIAGNOSIS — Z87891 Personal history of nicotine dependence: Secondary | ICD-10-CM | POA: Insufficient documentation

## 2019-06-02 DIAGNOSIS — R6889 Other general symptoms and signs: Secondary | ICD-10-CM | POA: Diagnosis not present

## 2019-06-02 DIAGNOSIS — Z888 Allergy status to other drugs, medicaments and biological substances status: Secondary | ICD-10-CM | POA: Diagnosis not present

## 2019-06-02 DIAGNOSIS — R509 Fever, unspecified: Secondary | ICD-10-CM | POA: Insufficient documentation

## 2019-06-02 DIAGNOSIS — U071 COVID-19: Secondary | ICD-10-CM | POA: Insufficient documentation

## 2019-06-02 DIAGNOSIS — Z882 Allergy status to sulfonamides status: Secondary | ICD-10-CM | POA: Diagnosis not present

## 2019-06-02 DIAGNOSIS — R05 Cough: Secondary | ICD-10-CM | POA: Diagnosis not present

## 2019-06-02 HISTORY — DX: COVID-19: U07.1

## 2019-06-02 LAB — COMPREHENSIVE METABOLIC PANEL
ALT: 40 U/L (ref 0–44)
AST: 52 U/L — ABNORMAL HIGH (ref 15–41)
Albumin: 4.2 g/dL (ref 3.5–5.0)
Alkaline Phosphatase: 48 U/L (ref 38–126)
Anion gap: 9 (ref 5–15)
BUN: 10 mg/dL (ref 8–23)
CO2: 24 mmol/L (ref 22–32)
Calcium: 9.1 mg/dL (ref 8.9–10.3)
Chloride: 104 mmol/L (ref 98–111)
Creatinine, Ser: 0.69 mg/dL (ref 0.44–1.00)
GFR calc Af Amer: >60 mL/min (ref >60)
GFR calc non Af Amer: >60 mL/min (ref >60)
Glucose, Bld: 115 mg/dL — ABNORMAL HIGH (ref 70–99)
Potassium: 3.7 mmol/L (ref 3.5–5.1)
Sodium: 137 mmol/L (ref 135–145)
Total Bilirubin: 0.8 mg/dL (ref 0.3–1.2)
Total Protein: 7.2 g/dL (ref 6.5–8.1)

## 2019-06-02 LAB — CBC WITH DIFFERENTIAL/PLATELET
Abs Immature Granulocytes: 0.01 10*3/uL (ref 0.00–0.07)
Basophils Absolute: 0 10*3/uL (ref 0.0–0.1)
Basophils Relative: 1 %
Eosinophils Absolute: 0.1 10*3/uL (ref 0.0–0.5)
Eosinophils Relative: 2 %
HCT: 41 % (ref 36.0–46.0)
Hemoglobin: 13.4 g/dL (ref 12.0–15.0)
Immature Granulocytes: 0 %
Lymphocytes Relative: 25 %
Lymphs Abs: 1.6 10*3/uL (ref 0.7–4.0)
MCH: 29.3 pg (ref 26.0–34.0)
MCHC: 32.7 g/dL (ref 30.0–36.0)
MCV: 89.7 fL (ref 80.0–100.0)
Monocytes Absolute: 0.8 10*3/uL (ref 0.1–1.0)
Monocytes Relative: 13 %
Neutro Abs: 3.7 10*3/uL (ref 1.7–7.7)
Neutrophils Relative %: 59 %
Platelets: 197 10*3/uL (ref 150–400)
RBC: 4.57 MIL/uL (ref 3.87–5.11)
RDW: 13.2 % (ref 11.5–15.5)
WBC: 6.3 10*3/uL (ref 4.0–10.5)
nRBC: 0 % (ref 0.0–0.2)

## 2019-06-02 LAB — TROPONIN I (HIGH SENSITIVITY)
Troponin I (High Sensitivity): 4 ng/L (ref <18)
Troponin I (High Sensitivity): 4 ng/L (ref <18)

## 2019-06-02 LAB — BRAIN NATRIURETIC PEPTIDE: B Natriuretic Peptide: 27 pg/mL (ref 0.0–100.0)

## 2019-06-02 LAB — SARS CORONAVIRUS 2 BY RT PCR (HOSPITAL ORDER, PERFORMED IN ~~LOC~~ HOSPITAL LAB): SARS Coronavirus 2: POSITIVE — AB

## 2019-06-02 LAB — LACTIC ACID, PLASMA: Lactic Acid, Venous: 0.8 mmol/L (ref 0.5–1.9)

## 2019-06-02 MED ORDER — DEXAMETHASONE SODIUM PHOSPHATE 10 MG/ML IJ SOLN
10.0000 mg | Freq: Once | INTRAMUSCULAR | Status: AC
  Administered 2019-06-02: 10 mg via INTRAVENOUS
  Filled 2019-06-02: qty 1

## 2019-06-02 MED ORDER — ALBUTEROL SULFATE HFA 108 (90 BASE) MCG/ACT IN AERS
2.0000 | INHALATION_SPRAY | Freq: Once | RESPIRATORY_TRACT | Status: AC
  Administered 2019-06-02: 2 via RESPIRATORY_TRACT
  Filled 2019-06-02: qty 6.7

## 2019-06-02 MED ORDER — HYDROCODONE-HOMATROPINE 5-1.5 MG/5ML PO SYRP: 5.0000 mL | ORAL_SOLUTION | Freq: Four times a day (QID) | ORAL | 0 refills | Status: DC | PRN

## 2019-06-02 MED ORDER — DEXAMETHASONE 4 MG PO TABS: 4.0000 mg | ORAL_TABLET | Freq: Two times a day (BID) | ORAL | 0 refills | Status: DC

## 2019-06-02 NOTE — Telephone Encounter (Signed)
SOB began Sunday evening. Monday worse and Today. Cough is dry, fever, muscle pain to both legs. Temp to 102.0 took Tylenol and down to 100.6. Pt stated that it is hard to get a deep breath in. No other symptoms.  Advised pt to call 911 for emergency assistance but pt refused. Pt stated that her husband can take her Called PCP office and informed Maudie Mercury of pt's symptoms and agrees with ED disposition. [facility], Lolita Patella RN- charge nurse and updated her with triage information. Magda Paganini RN advised that pt cannot have any support person. Called pt back and informed her of above. Pt very tearful. Emotional support provided pt.   Reason for Disposition . SEVERE or constant chest pain or pressure (Exception: mild central chest pain, present only when coughing)  Protocols used: CORONAVIRUS (COVID-19) DIAGNOSED OR SUSPECTED-A-AH

## 2019-06-02 NOTE — ED Provider Notes (Signed)
[facility] EMERGENCY DEPARTMENT Provider Note   CSN: [id number] Arrival date & time: 06/02/19  1536     History   Chief Complaint No chief complaint on file.   HPI Brenda [name] is a 63 y.o. female.     Patient is a 63 year old female who presents to the emergency department with complaint of cough and fever.  The patient states that her cough started 2 days ago and her fever started on yesterday.  Her temperature maximum was 101.  She says that she found out that someone she was at church with and had close contact with on Sunday has just recently tested positive for COVID-19.  The patient complains of feeling weak and tired.  Also having body aches and generally not feeling well.  She has been using Tylenol for her temperature elevation and body aches with some improvement.  No vomiting reported.  No diarrhea reported.  No loss of taste or smell reported at this time.  It is of note that the patient has a history of chronic obstructive pulmonary disease.  She is not on regular medications for this and she does not require oxygen at home.  She presents now for assistance with this issue.  The history is provided by the patient.    Past Medical History:  Diagnosis Date  . Aortic valve stenosis    "very mild"  . Atypical chest pain 2007; 2015   cardiolyte neg, echo nl, cath showed mild/nonobstructive LAD disease  . Bilateral primary osteoarthritis of knee 06/30/2012   Right >L.  Right unicompartmental knee arthroplasty 10/25/18  . CAD (coronary artery disease)   . Chronic LBP    Multiple surgeries  . COPD (chronic obstructive pulmonary disease) ([place])   . DDD (degenerative disc disease)    spinal stenosis  . Fatty liver   . History of GI bleed    NSAIDS  . Hyperlipidemia    mixed  . Microscopic hematuria    full w/ u unrevealing X 2  . Normal nuclear stress test 11/11 and 06/2014   negative, EF normal  . Palpitations 2006   48H holter neg  . PONV (postoperative  nausea and vomiting)    "never threw up but felt sick on my stomach", also believes that she was aware of surgery during her hysterectomy   . Pre-diabetes   . Prediabetes    Highest A1c 6.1% as of 03/2017  . RBBB (right bundle branch block)   . Recurrent UTI   . Tobacco dependence in remission    Quit fall 2015.  Only occasional bronchitis illness (CT shest 2009 nl- for f/u of ? nodule on CXR)    Patient Active Problem List   Diagnosis Date Noted  . Primary osteoarthritis of right knee 10/13/2018  . Chronic narcotic use 06/12/2016  . Encounter for chronic pain management 06/12/2016  . Bilateral sciatica 02/10/2016  . Spinal stenosis of lumbar region without neurogenic claudication 02/10/2016  . Gastroesophageal reflux disease without esophagitis 01/30/2016  . Chronic low back pain 10/12/2015  . Unstable angina ([place]) 05/04/2015  . CAD (coronary artery disease) 06/25/2014  . Chest pain 06/25/2014  . Paresthesia of both feet 03/11/2014  . Paresthesia of skin 03/11/2014  . Health maintenance examination 11/12/2013  . Acute exacerbation of chronic obstructive pulmonary disease (COPD) ([place]) 08/13/2013  . Arthritis of knee, degenerative 04/10/2013  . H/O Spinal surgery 03/11/2013  . Degenerative disc disease, lumbar 03/11/2013  . Degenerative arthritis of right knee 03/11/2013  .  Anxiety state 03/03/2013  . COPD (chronic obstructive pulmonary disease) ([place]) 03/03/2013  . Generalized anxiety disorder 03/03/2013  . Osteoarthritis of knee 06/30/2012  . Right knee pain 06/26/2012  . Colon cancer screening 02/21/2012  . Breast cancer screening 02/21/2012  . Prediabetes 11/09/2010  . Hyperlipidemia 11/09/2010  . TOBACCO ABUSE 11/09/2010  . BACK PAIN, LUMBAR, CHRONIC 11/09/2010  . HEMATURIA, MICROSCOPIC, HX OF 11/09/2010    Past Surgical History:  Procedure Laterality Date  . ABDOMINAL HYSTERECTOMY  1997   DUB  . APPENDECTOMY  1984  . CARDIAC CATHETERIZATION  10/09/2005   no CAD,  mildly elevated LVEDP, normal LV function (Dr. Gerrie Nordmann)  . CARDIAC CATHETERIZATION N/A 05/16/2015   Mild non-obstructive CAD--med mgmt.  Procedure: Left Heart Cath and Coronary Angiography;  Surgeon: Pixie Casino, MD;  Location: [facility];  Service: Cardiovascular;  Laterality: N/A;  . CARDIOVASCULAR STRESS TEST  06/2014   normal lexiscan NST  . CARDIOVASCULAR STRESS TEST  2006   persantine - no ischemia, low risk   . KNEE ARTHROSCOPY Bilateral   . left wrist ganglion cyst excision  2010  . [place]   right iliac crest bone graft+metal instrumentation; 2005 metal removal and decompression, 2011 lumbar decompression 4-11, then stabilization/ instrumentation done 09-19-10: L2,L3, L5 left and L2 , L3, L4 right pedical remnant L4 left embedded. Left iliac crest bone graft-- Dr Velna Ochs  . LUMBAR SPINE SURGERY  02/10/2016   Dr. Velna Ochs: lumbar decompression, instrumentation removal, and fusion exploration--HELPED her a lot, esp her radicular leg pains.  . OOPHORECTOMY Right    cyst  . PARTIAL KNEE ARTHROPLASTY Right 11/04/2018   Procedure: UNICOMPARTMENTAL KNEE;  Surgeon: Renette Butters, MD;  Location: [facility];  Service: Orthopedics;  Laterality: Right;  Adductor Block  . TONSILLECTOMY  63 yrs old  . TONSILLECTOMY    . TRANSTHORACIC ECHOCARDIOGRAM  2006   EF=>55%, trace MR, mild TR, trace AV regurg, trace pulm valve regurg      OB History   No obstetric history on file.      Home Medications    Prior to Admission medications   Medication Sig Start Date End Date Taking? Authorizing Provider  acetaminophen (TYLENOL) 500 MG tablet every 6 (six) hours as needed for mild pain or headache.    [provider]  albuterol (PROVENTIL HFA;VENTOLIN HFA) 108 (90 Base) MCG/ACT inhaler Inhale 2 puffs into the lungs every 4 (four) hours as needed for wheezing or shortness of breath (bronchitis). Reported on 03/12/2016 Patient not taking: Reported on 03/19/2019 03/14/17    Tammi Sou, MD  clopidogrel (PLAVIX) 75 MG tablet Take 1 tablet (75 mg total) by mouth daily. 06/05/18   McGowen, Adrian Blackwater, MD  cyclobenzaprine (FLEXERIL) 10 MG tablet take 1 tablet by mouth every 8 hours if needed Patient taking differently: Take 10 mg by mouth 3 (three) times daily as needed for muscle spasms.  05/05/18   McGowen, Adrian Blackwater, MD  fenofibrate 160 MG tablet take 1 tablet by mouth once daily with food Patient taking differently: Take 160 mg by mouth daily.  06/05/18   McGowen, Adrian Blackwater, MD  LORazepam (ATIVAN) 1 MG tablet Take 1 tablet (1 mg total) by mouth 2 (two) times daily. 03/19/19   McGowen, Adrian Blackwater, MD  nitroGLYCERIN (NITROSTAT) 0.4 MG SL tablet Place 1 tablet (0.4 mg total) under the tongue every 5 (five) minutes as needed for chest pain (MAX 3 doses.). Patient not taking: Reported  on 03/19/2019 08/19/14   Pixie Casino, MD  oxyCODONE (OXY IR/ROXICODONE) 5 MG immediate release tablet 1-2 po tid prn pain 03/19/19   McGowen, Adrian Blackwater, MD  pantoprazole (PROTONIX) 40 MG tablet Take 1 tablet (40 mg total) by mouth daily. 06/05/18   McGowen, Adrian Blackwater, MD  rosuvastatin (CRESTOR) 40 MG tablet Take 1 tablet (40 mg total) by mouth daily. 06/05/18   McGowen, Adrian Blackwater, MD  sodium chloride (OCEAN) 0.65 % SOLN nasal spray Place 1 spray into both nostrils as needed for congestion.    [provider]    Family History Family History  Problem Relation Age of Onset  . Heart Problems Mother        and thyroid problems  . Hyperlipidemia Mother   . Diabetes Mother   . Breast cancer Mother   . Heart failure Father        CHF, heart attack, diabetes, hyperlipidemia  . Heart disease Brother        back problems  . Hypertension Brother   . Kidney failure Maternal Grandmother   . Stroke Maternal Grandfather   . Cancer Paternal Grandmother        mouth - snuff  . Heart attack Paternal Grandfather        stroke, HTN  . Hyperlipidemia Brother   . Heart attack Brother   . Cancer  Brother   . Coronary artery disease Neg Hx     Social History Social History   Tobacco Use  . Smoking status: Former Smoker    Packs/day: 1.00    Years: 35.00    Pack years: 35.00    Types: Cigarettes    Quit date: 07/22/2014    Years since quitting: 4.8  . Smokeless tobacco: Never Used  . Tobacco comment: down to ~2 cigarettes/daily (06/23/14) - Quit Smoking around 07/20/14!  Substance Use Topics  . Alcohol use: No    Alcohol/week: 0.0 standard drinks  . Drug use: No     Allergies   Aspirin, Beta adrenergic blockers, Nsaids, Penicillins, Prochlorperazine edisylate, Sulfonamide derivatives, and Amitriptyline hcl   Review of Systems Review of Systems  Constitutional: Positive for fever. Negative for activity change and appetite change.  HENT: Negative for congestion, ear discharge, ear pain, facial swelling, nosebleeds, rhinorrhea, sneezing and tinnitus.   Eyes: Negative for photophobia, pain and discharge.  Respiratory: Positive for cough. Negative for choking, shortness of breath and wheezing.   Cardiovascular: Negative for chest pain, palpitations and leg swelling.  Gastrointestinal: Negative for abdominal pain, blood in stool, constipation, diarrhea, nausea and vomiting.  Genitourinary: Negative for difficulty urinating, dysuria, flank pain, frequency and hematuria.  Musculoskeletal: Negative for back pain, gait problem, myalgias and neck pain.  Skin: Negative for color change, rash and wound.  Neurological: Negative for dizziness, seizures, syncope, facial asymmetry, speech difficulty, weakness and numbness.  Hematological: Negative for adenopathy. Does not bruise/bleed easily.  Psychiatric/Behavioral: Negative for agitation, confusion, hallucinations, self-injury and suicidal ideas. The patient is not nervous/anxious.      Physical Exam Updated Vital Signs There were no vitals taken for this visit.  Physical Exam Vitals signs and nursing note reviewed.   Constitutional:      Appearance: She is well-developed. She is not toxic-appearing.  HENT:     Head: Normocephalic.     Right Ear: Tympanic membrane and external ear normal.     Left Ear: Tympanic membrane and external ear normal.  Eyes:     General: Lids are normal.  Pupils: Pupils are equal, round, and reactive to light.  Neck:     Musculoskeletal: Normal range of motion and neck supple.     Vascular: No carotid bruit.  Cardiovascular:     Rate and Rhythm: Regular rhythm. Tachycardia present.     Pulses: Normal pulses.     Heart sounds: Normal heart sounds.  Pulmonary:     Effort: Tachypnea present. No respiratory distress.     Breath sounds: Wheezing and rhonchi present.  Abdominal:     General: Bowel sounds are normal.     Palpations: Abdomen is soft.     Tenderness: There is no abdominal tenderness. There is no guarding.  Musculoskeletal: Normal range of motion.     Right lower leg: No edema.     Left lower leg: No edema.  Lymphadenopathy:     Head:     Right side of head: No submandibular adenopathy.     Left side of head: No submandibular adenopathy.     Cervical: No cervical adenopathy.  Skin:    General: Skin is warm and dry.  Neurological:     Mental Status: She is alert and oriented to person, place, and time.     Cranial Nerves: No cranial nerve deficit.     Sensory: No sensory deficit.  Psychiatric:        Speech: Speech normal.      ED Treatments / Results  Labs (all labs ordered are listed, but only abnormal results are displayed) Labs Reviewed - No data to display  EKG None  Radiology No results found.  Procedures Procedures (including critical care time)  Medications Ordered in ED Medications - No data to display   Initial Impression / Assessment and Plan / ED Course  I have reviewed the triage vital signs and the nursing notes.  Pertinent labs & imaging results that were available during my care of the patient were reviewed by me  and considered in my medical decision making (see chart for details).          Final Clinical Impressions(s) / ED Diagnoses MDM  Patient reports cough started about 2 days ago, fever started 1 day ago.  Patient has symptoms of weakness and being fatigued easily.  She says she has had exposure to someone who tested positive for the COVID-19 virus.  The comprehensive metabolic panel is within normal limits.  The troponin is negative for acute event.  The electrocardiogram shows a normal sinus rhythm with a right bundle branch block, similar to previous electrocardiogram.  No evidence of STEMI or life-threatening arrhythmia.  The complete blood count is well within normal limits. B natruretic peptide is normal, lactic acid is normal, chest x-ray is also read as normal.  COVID-19 virus test is positive.  I have advised the patient of the findings on examination as well as the findings on lab work and chest x-ray.  I discussed with the patient the importance of using her mask constantly, quarantining for the next 14 days, increasing fluids, maintaining physical distancing of 6 feet or more, washing hands frequently, washing surfaces as needed.  The patient has someone at home who can assist her with the quarantine.  Prescription for Hycodan given to the patient for cough, and Decadron 2 times daily.  The patient was given an albuterol inhaler to use every 4 hours.  I have asked the patient to use Tylenol every 4 hours or ibuprofen every 6 hours for fever and aching.  The patient will  follow-up with her primary physician at the end of the 14-day quarantine.  Have given the patient strict return instructions if there is any fever that would not respond to the Tylenol or ibuprofen, hemoptysis, worsening of symptoms, difficulty with breathing or speaking, changes in condition, problems, or concerns.  Patient is in agreement with this plan.   Final diagnoses:  [id number] virus infection    ED Discharge  Orders         Ordered    dexamethasone (DECADRON) 4 MG tablet  2 times daily with meals     06/02/19 1834    HYDROcodone-homatropine (HYCODAN) 5-1.5 MG/5ML syrup  Every 6 hours PRN     06/02/19 1834           Lily Kocher, PA-C 06/02/19 1845    Virgel Manifold, MD 06/02/19 1958

## 2019-06-02 NOTE — ED Triage Notes (Signed)
Pt was exposed to a positive COVID case. Started having a dry cough and fever since Sunday that has progressively gotten worse.  Too tylenol today for fever.

## 2019-06-02 NOTE — Telephone Encounter (Signed)
Noted.  ED visit reviewed.  Pt covid 19 pos, d/c'd home on decadron, hycodan, and albut nebs.

## 2019-06-02 NOTE — Discharge Instructions (Addendum)
Your chest x-ray is normal.  Your oxygen level is within normal limits.  Your COVID-19 test however is positive. Please quarantine yourself over the next 14 days.  Please use your mask constantly, wash your hands frequently, wipe off surfaces, and maintain physical distancing of 6 feet or more.  Please increase your fluids including water, Gatorade, and juices.  Use Tylenol every 4 hours or ibuprofen every 6 hours over the next few days to keep fever and body aching down.  Please use 2 puffs of albuterol every 4 hours.  Use Decadron 2 times daily with food.  Use Hycodan for cough. This medication may cause drowsiness. Please do not drink, drive, or participate in activity that requires concentration while taking this medication.  Please call your physician for follow-up after your 14-day quarantine.  Please return to the emergency department if you have fever that would not control with Tylenol or ibuprofen.  Your coughing up blood, you have difficulty with breathing or speaking, or your condition is worsening.

## 2019-06-04 LAB — NOVEL CORONAVIRUS, NAA: SARS-CoV-2, NAA: DETECTED — AB

## 2019-06-07 LAB — CULTURE, BLOOD (ROUTINE X 2)
Culture: NO GROWTH
Culture: NO GROWTH
Special Requests: ADEQUATE
Special Requests: ADEQUATE

## 2019-06-09 ENCOUNTER — Telehealth: Payer: Self-pay

## 2019-06-09 NOTE — Telephone Encounter (Signed)
Spoke with patient, she went to [facility] ED last Tuesday 06/02/19 and tested positive for COVID-19. She was given Hydrocodan syrup and decadron. She believes antibiotic will help with fever. Last temp was 102.6.   Please advise if any other suggestions.

## 2019-06-09 NOTE — Telephone Encounter (Signed)
Needs telemedicine visit.

## 2019-06-09 NOTE — Telephone Encounter (Signed)
Patient has been scheduled for VV 06/10/19 @330 . Patient has taken OTC fever reducers with no luck. Highest temp @ 102.8. Temp will go down to 101 with Tylenol but shortly goes up again after a few hours.

## 2019-06-10 ENCOUNTER — Other Ambulatory Visit: Payer: Self-pay

## 2019-06-10 ENCOUNTER — Encounter: Payer: Self-pay | Admitting: Family Medicine

## 2019-06-10 ENCOUNTER — Ambulatory Visit (INDEPENDENT_AMBULATORY_CARE_PROVIDER_SITE_OTHER): Payer: Medicare Other | Admitting: Family Medicine

## 2019-06-10 VITALS — Temp 97.8°F | Ht 64.0 in | Wt 186.0 lb

## 2019-06-10 DIAGNOSIS — Z7189 Other specified counseling: Secondary | ICD-10-CM

## 2019-06-10 DIAGNOSIS — U071 COVID-19: Secondary | ICD-10-CM

## 2019-06-10 MED ORDER — BENZONATATE 200 MG PO CAPS: 200.0000 mg | ORAL_CAPSULE | Freq: Two times a day (BID) | ORAL | 0 refills | Status: DC | PRN

## 2019-06-10 MED ORDER — DM-GUAIFENESIN ER 30-600 MG PO TB12: 1.0000 | ORAL_TABLET | Freq: Two times a day (BID) | ORAL | 0 refills | Status: DC

## 2019-06-10 MED ORDER — HYDROCORTISONE ACETATE 25 MG RE SUPP: 25.0000 mg | Freq: Two times a day (BID) | RECTAL | 0 refills | Status: DC

## 2019-06-10 MED ORDER — DICYCLOMINE HCL 10 MG PO CAPS: 10.0000 mg | ORAL_CAPSULE | Freq: Three times a day (TID) | ORAL | 0 refills | Status: DC

## 2019-06-10 NOTE — Progress Notes (Signed)
VIRTUAL VISIT VIA VIDEO  I connected with Brenda [name] on 06/11/19 at  3:30 PM EDT by a video enabled telemedicine application and verified that I am speaking with the correct person using two identifiers. Location patient: Home Location provider: [facility], Office Persons participating in the virtual visit: Patient, Dr. Raoul Pitch and R.Baker, LPN  I discussed the limitations of evaluation and management by telemedicine and the availability of in person appointments. The patient expressed understanding and agreed to proceed.      Brenda [name] , [birth year], 63 y.o., female MRN: [id number] Patient Care Team    Relationship Specialty Notifications Start End  Tammi Sou, MD PCP - General   11/22/10    Comment: Barnett Abu, MD PCP - Cardiology Cardiology Admissions 09/16/18   Pixie Casino, MD Consulting Physician Cardiology  06/28/14   Loni Dolly, MD  Orthopedic Surgery  06/06/17   Renette Butters, MD Consulting Physician Orthopedic Surgery  09/15/18     Chief Complaint  Patient presents with  . Fever    Pt states fever has gone down some today and is around 101F now is 97.8. Pt is staying hydrated with ice chips. Denies SOB when sitting, only with activity.   . Diarrhea    Pt has had diarrhea since last night and has seen some bright red blood. Scanty amount of blood and it is everytime she uses the bathroom      Subjective: Pt presents for an OV to discuss ongoing fever after COVID-19 positive test.  Patient was seen in the emergency room on June 02, 2019 after having a new onset cough of 2 days duration with a fever.  She has concerns over COVID-19 because she had a fellow church member have exposure to a family member that tested positive for COVID-19.  At onset she endorsed a fever of 101 F, dry cough, body aches, fatigue.  Chest x-ray was completed in the emergency room and read as normal. She is presenting here today secondary to  ongoing fever the past 3 nights of 101 F.  She states she continues to have a cough that is mostly when her throat gets dry with talking.  Otherwise her cough is greatly improved.  She reports her breathing has also improved.  She does still become mildly short of breath with activity.  She does endorse new onset diarrhea since last night.  She reports having at least 6 bowel movements between last night and today that are loose and watery.  She endorses having a scant amount of bright red blood on her toilet tissue. She has been taking Tylenol for her fever.  She has been using an albuterol inhaler the emergency room gave her for her cough/shortness of breath.  She reports she has been quarantining and even wearing a mask in her own home to help prevent her family members from contracting the virus.  Depression screen [facility] 2/9 12/17/2018 03/06/2018 06/06/2017 06/06/2017  Decreased Interest 0 0 0 0  Down, Depressed, Hopeless 0 0 0 0  PHQ - 2 Score 0 0 0 0  Altered sleeping - 0 - -  Tired, decreased energy - 0 - -  Change in appetite - 0 - -  Feeling bad or failure about yourself  - 0 - -  Trouble concentrating - 0 - -  Moving slowly or fidgety/restless - 0 - -  Suicidal thoughts - 0 - -  PHQ-9 Score -  0 - -    Allergies  Allergen Reactions  . Aspirin Other (See Comments)     GI Bleed  . Beta Adrenergic Blockers Other (See Comments)    REACTION: decreased libido  . Nsaids Other (See Comments)     GI Upset  . Penicillins Swelling and Other (See Comments)    DID THE REACTION INVOLVE: Swelling of the face/tongue/throat, SOB, or low BP? Yes Sudden or severe rash/hives, skin peeling, or the inside of the mouth or nose? Yes Did it require medical treatment? Yes When did it last happen? 63 years old If all above answers are "NO", may proceed with cephalosporin use.   Marland Kitchen Prochlorperazine Edisylate Other (See Comments)     Stroke like symptoms  . Sulfonamide Derivatives Other (See Comments)     Unknown allergic reaction  . Amitriptyline Hcl Palpitations and Other (See Comments)     increased heart rate   Social History   Social History Narrative  . Not on file   Past Medical History:  Diagnosis Date  . Aortic valve stenosis    "very mild"  . Atypical chest pain 2007; 2015   cardiolyte neg, echo nl, cath showed mild/nonobstructive LAD disease  . Bilateral primary osteoarthritis of knee 06/30/2012   Right >L.  Right unicompartmental knee arthroplasty 10/25/18  . CAD (coronary artery disease)   . Chronic LBP    Multiple surgeries  . COPD (chronic obstructive pulmonary disease) ([place])   . COVID-19 virus infection 06/02/2019   Eval at [facility] ED and d/c'd home.  . DDD (degenerative disc disease)    spinal stenosis  . Fatty liver   . History of GI bleed    NSAIDS  . Hyperlipidemia    mixed  . Microscopic hematuria    full w/ u unrevealing X 2  . Normal nuclear stress test 11/11 and 06/2014   negative, EF normal  . Palpitations 2006   48H holter neg  . PONV (postoperative nausea and vomiting)    "never threw up but felt sick on my stomach", also believes that she was aware of surgery during her hysterectomy   . Pre-diabetes   . Prediabetes    Highest A1c 6.1% as of 03/2017  . RBBB (right bundle branch block)   . Recurrent UTI   . Tobacco dependence in remission    Quit fall 2015.  Only occasional bronchitis illness (CT shest 2009 nl- for f/u of ? nodule on CXR)   Past Surgical History:  Procedure Laterality Date  . ABDOMINAL HYSTERECTOMY  1997   DUB  . APPENDECTOMY  1984  . CARDIAC CATHETERIZATION  10/09/2005   no CAD, mildly elevated LVEDP, normal LV function (Dr. Gerrie Nordmann)  . CARDIAC CATHETERIZATION N/A 05/16/2015   Mild non-obstructive CAD--med mgmt.  Procedure: Left Heart Cath and Coronary Angiography;  Surgeon: Pixie Casino, MD;  Location: [facility];  Service: Cardiovascular;  Laterality: N/A;  . CARDIOVASCULAR STRESS TEST  06/2014   normal lexiscan  NST  . CARDIOVASCULAR STRESS TEST  2006   persantine - no ischemia, low risk   . KNEE ARTHROSCOPY Bilateral   . left wrist ganglion cyst excision  2010  . [place]   right iliac crest bone graft+metal instrumentation; 2005 metal removal and decompression, 2011 lumbar decompression 4-11, then stabilization/ instrumentation done 09-19-10: L2,L3, L5 left and L2 , L3, L4 right pedical remnant L4 left embedded. Left iliac crest bone graft-- Dr Velna Ochs  . LUMBAR SPINE  SURGERY  02/10/2016   Dr. Velna Ochs: lumbar decompression, instrumentation removal, and fusion exploration--HELPED her a lot, esp her radicular leg pains.  . OOPHORECTOMY Right    cyst  . PARTIAL KNEE ARTHROPLASTY Right 11/04/2018   Procedure: UNICOMPARTMENTAL KNEE;  Surgeon: Renette Butters, MD;  Location: [facility];  Service: Orthopedics;  Laterality: Right;  Adductor Block  . TONSILLECTOMY  63 yrs old  . TONSILLECTOMY    . TRANSTHORACIC ECHOCARDIOGRAM  2006   EF=>55%, trace MR, mild TR, trace AV regurg, trace pulm valve regurg    Family History  Problem Relation Age of Onset  . Heart Problems Mother        and thyroid problems  . Hyperlipidemia Mother   . Diabetes Mother   . Breast cancer Mother   . Heart failure Father        CHF, heart attack, diabetes, hyperlipidemia  . Heart disease Brother        back problems  . Hypertension Brother   . Kidney failure Maternal Grandmother   . Stroke Maternal Grandfather   . Cancer Paternal Grandmother        mouth - snuff  . Heart attack Paternal Grandfather        stroke, HTN  . Hyperlipidemia Brother   . Heart attack Brother   . Cancer Brother   . Coronary artery disease Neg Hx    Allergies as of 06/10/2019      Reactions   Aspirin Other (See Comments)    GI Bleed   Beta Adrenergic Blockers Other (See Comments)   REACTION: decreased libido   Nsaids Other (See Comments)    GI Upset   Penicillins Swelling, Other (See Comments)   DID THE REACTION INVOLVE:  Swelling of the face/tongue/throat, SOB, or low BP? Yes Sudden or severe rash/hives, skin peeling, or the inside of the mouth or nose? Yes Did it require medical treatment? Yes When did it last happen? 63 years old If all above answers are "NO", may proceed with cephalosporin use.   Prochlorperazine Edisylate Other (See Comments)    Stroke like symptoms   Sulfonamide Derivatives Other (See Comments)   Unknown allergic reaction   Amitriptyline Hcl Palpitations, Other (See Comments)    increased heart rate      Medication List       Accurate as of June 10, 2019  3:37 PM. If you have any questions, ask your nurse or doctor.        acetaminophen 500 MG tablet Commonly known as: TYLENOL every 6 (six) hours as needed for mild pain or headache.   albuterol 108 (90 Base) MCG/ACT inhaler Commonly known as: VENTOLIN HFA Inhale 2 puffs into the lungs every 4 (four) hours as needed for wheezing or shortness of breath (bronchitis). Reported on 03/12/2016   clopidogrel 75 MG tablet Commonly known as: Plavix Take 1 tablet (75 mg total) by mouth daily.   cyclobenzaprine 10 MG tablet Commonly known as: FLEXERIL take 1 tablet by mouth every 8 hours if needed What changed:   how much to take  how to take this  when to take this  reasons to take this  additional instructions   dexamethasone 4 MG tablet Commonly known as: DECADRON Take 1 tablet (4 mg total) by mouth 2 (two) times daily with a meal.   fenofibrate 160 MG tablet take 1 tablet by mouth once daily with food What changed:   how much to take  how to take this  when  to take this  additional instructions   HYDROcodone-homatropine 5-1.5 MG/5ML syrup Commonly known as: HYCODAN Take 5 mLs by mouth every 6 (six) hours as needed.   LORazepam 1 MG tablet Commonly known as: ATIVAN Take 1 tablet (1 mg total) by mouth 2 (two) times daily.   nitroGLYCERIN 0.4 MG SL tablet Commonly known as: NITROSTAT Place 1 tablet  (0.4 mg total) under the tongue every 5 (five) minutes as needed for chest pain (MAX 3 doses.).   oxyCODONE 5 MG immediate release tablet Commonly known as: Oxy IR/ROXICODONE 1-2 po tid prn pain   pantoprazole 40 MG tablet Commonly known as: PROTONIX Take 1 tablet (40 mg total) by mouth daily.   rosuvastatin 40 MG tablet Commonly known as: CRESTOR Take 1 tablet (40 mg total) by mouth daily.   sodium chloride 0.65 % Soln nasal spray Commonly known as: OCEAN Place 1 spray into both nostrils as needed for congestion.       All past medical history, surgical history, allergies, family history, immunizations andmedications were updated in the EMR today and reviewed under the history and medication portions of their EMR.     ROS: Negative, with the exception of above mentioned in HPI   Objective:  Temp 97.8 F (36.6 C) (Temporal)   Ht 5\' 4"  (1.626 m)   Wt 186 lb (84.4 kg)   SpO2 94%   BMI 31.93 kg/m  Body mass index is 31.93 kg/m. Gen: Afebrile. No acute distress. Nontoxic in appearance.  HENT: AT. [place].  Eyes:Pupils Equal Round Reactive to light, Extraocular movements intact,  Conjunctiva without redness, discharge or icterus. Chest: No cough or shortness of breath appreciated on exam.  Able to speak in full sentences without becoming winded. Skin: No rashes, purpura or petechiae.  Neuro:  Alert. Oriented x3  Psych: Normal affect, dress and demeanor. Normal speech. Normal thought content and judgment.  No exam data present No results found. No results found for this or any previous visit (from the past 24 hour(s)).  Assessment/Plan: KYESHA BALLA is a 63 y.o. female present for OV for  COVID-19 virus infection/Educated About Covid-19 Virus Infection Rest, hydrate.  Discussed increasing her water and G2 consumption.  Urine should be very pale yellow if darker urine need to hydrate more.  She reports understanding. mucinex DM and Tessalon Perles prescribed for cough.  Continue Tylenol for fevers and aches. Continue albuterol every 4-6 hours as needed. Discussed if possible avoid antidiarrheals unless unable to stay hydrated or having greater than 10 stools a day.  Low-dose Bentyl prescribed with instructions. Her rectal bleeding seems to be secondary to possibly an irritated hemorrhoid with her diarrhea.  Prescribed hydrocortisone suppository in the event they worsen. -Patient was reminded to remain in quarantine.  With a positive COVID testing when should remain in quarantine 10 to 14 days and be fever free for at least 3 days before removing from quarantine.  She reports understanding. Patient was educated on emergent care of shortness of breath, unable to tolerate p.o. or have signs of dehydration she is to present to the emergency room.  Otherwise follow-up with her PCP in 4-5 days.    Reviewed expectations re: course of current medical issues.  Discussed self-management of symptoms.  Outlined signs and symptoms indicating need for more acute intervention.  Patient verbalized understanding and all questions were answered.  Patient received an After-Visit Summary.    No orders of the defined types were placed in this encounter.  > 25  minutes spent with patient, >50% of time spent face to face     Note is dictated utilizing voice recognition software. Although note has been proof read prior to signing, occasional typographical errors still can be missed. If any questions arise, please do not hesitate to call for verification.   electronically signed by:  Howard Pouch, DO  [facility]

## 2019-06-11 ENCOUNTER — Encounter: Payer: Self-pay | Admitting: Family Medicine

## 2019-06-11 NOTE — Patient Instructions (Signed)

## 2019-06-13 ENCOUNTER — Emergency Department (HOSPITAL_COMMUNITY): Payer: Medicare Other

## 2019-06-13 ENCOUNTER — Encounter (HOSPITAL_COMMUNITY): Payer: Self-pay | Admitting: Emergency Medicine

## 2019-06-13 ENCOUNTER — Inpatient Hospital Stay (HOSPITAL_COMMUNITY)
Admission: EM | Admit: 2019-06-13 | Discharge: 2019-06-18 | DRG: 177 | Disposition: A | Discharge status: Home / Self Care | Payer: Medicare Other | Attending: Internal Medicine | Admitting: Internal Medicine

## 2019-06-13 ENCOUNTER — Other Ambulatory Visit: Payer: Self-pay

## 2019-06-13 DIAGNOSIS — Z8249 Family history of ischemic heart disease and other diseases of the circulatory system: Secondary | ICD-10-CM

## 2019-06-13 DIAGNOSIS — Z886 Allergy status to analgesic agent status: Secondary | ICD-10-CM | POA: Diagnosis not present

## 2019-06-13 DIAGNOSIS — R7303 Prediabetes: Secondary | ICD-10-CM | POA: Diagnosis present

## 2019-06-13 DIAGNOSIS — R739 Hyperglycemia, unspecified: Secondary | ICD-10-CM | POA: Diagnosis not present

## 2019-06-13 DIAGNOSIS — E785 Hyperlipidemia, unspecified: Secondary | ICD-10-CM | POA: Diagnosis present

## 2019-06-13 DIAGNOSIS — J440 Chronic obstructive pulmonary disease with acute lower respiratory infection: Secondary | ICD-10-CM | POA: Diagnosis present

## 2019-06-13 DIAGNOSIS — M170 Bilateral primary osteoarthritis of knee: Secondary | ICD-10-CM | POA: Diagnosis present

## 2019-06-13 DIAGNOSIS — R197 Diarrhea, unspecified: Secondary | ICD-10-CM | POA: Diagnosis present

## 2019-06-13 DIAGNOSIS — I350 Nonrheumatic aortic (valve) stenosis: Secondary | ICD-10-CM | POA: Diagnosis present

## 2019-06-13 DIAGNOSIS — Z7902 Long term (current) use of antithrombotics/antiplatelets: Secondary | ICD-10-CM

## 2019-06-13 DIAGNOSIS — R509 Fever, unspecified: Secondary | ICD-10-CM | POA: Diagnosis not present

## 2019-06-13 DIAGNOSIS — I2510 Atherosclerotic heart disease of native coronary artery without angina pectoris: Secondary | ICD-10-CM | POA: Diagnosis present

## 2019-06-13 DIAGNOSIS — Z888 Allergy status to other drugs, medicaments and biological substances status: Secondary | ICD-10-CM | POA: Diagnosis not present

## 2019-06-13 DIAGNOSIS — Y92230 Patient room in hospital as the place of occurrence of the external cause: Secondary | ICD-10-CM | POA: Diagnosis not present

## 2019-06-13 DIAGNOSIS — Z79899 Other long term (current) drug therapy: Secondary | ICD-10-CM

## 2019-06-13 DIAGNOSIS — Z833 Family history of diabetes mellitus: Secondary | ICD-10-CM

## 2019-06-13 DIAGNOSIS — J189 Pneumonia, unspecified organism: Secondary | ICD-10-CM

## 2019-06-13 DIAGNOSIS — U071 COVID-19: Principal | ICD-10-CM | POA: Diagnosis present

## 2019-06-13 DIAGNOSIS — J988 Other specified respiratory disorders: Secondary | ICD-10-CM | POA: Diagnosis not present

## 2019-06-13 DIAGNOSIS — R0602 Shortness of breath: Secondary | ICD-10-CM | POA: Diagnosis not present

## 2019-06-13 DIAGNOSIS — Z880 Allergy status to penicillin: Secondary | ICD-10-CM | POA: Diagnosis not present

## 2019-06-13 DIAGNOSIS — Z87891 Personal history of nicotine dependence: Secondary | ICD-10-CM | POA: Diagnosis not present

## 2019-06-13 DIAGNOSIS — J9601 Acute respiratory failure with hypoxia: Secondary | ICD-10-CM | POA: Diagnosis present

## 2019-06-13 DIAGNOSIS — Z8349 Family history of other endocrine, nutritional and metabolic diseases: Secondary | ICD-10-CM

## 2019-06-13 DIAGNOSIS — J1289 Other viral pneumonia: Secondary | ICD-10-CM | POA: Diagnosis not present

## 2019-06-13 DIAGNOSIS — Z90721 Acquired absence of ovaries, unilateral: Secondary | ICD-10-CM | POA: Diagnosis not present

## 2019-06-13 DIAGNOSIS — Z96651 Presence of right artificial knee joint: Secondary | ICD-10-CM | POA: Diagnosis present

## 2019-06-13 DIAGNOSIS — K760 Fatty (change of) liver, not elsewhere classified: Secondary | ICD-10-CM | POA: Diagnosis present

## 2019-06-13 DIAGNOSIS — F419 Anxiety disorder, unspecified: Secondary | ICD-10-CM | POA: Diagnosis present

## 2019-06-13 DIAGNOSIS — R0902 Hypoxemia: Secondary | ICD-10-CM

## 2019-06-13 DIAGNOSIS — Z90710 Acquired absence of both cervix and uterus: Secondary | ICD-10-CM

## 2019-06-13 DIAGNOSIS — T380X5A Adverse effect of glucocorticoids and synthetic analogues, initial encounter: Secondary | ICD-10-CM | POA: Diagnosis not present

## 2019-06-13 DIAGNOSIS — J1282 Pneumonia due to coronavirus disease 2019: Secondary | ICD-10-CM | POA: Diagnosis present

## 2019-06-13 DIAGNOSIS — M4800 Spinal stenosis, site unspecified: Secondary | ICD-10-CM | POA: Diagnosis present

## 2019-06-13 DIAGNOSIS — Z803 Family history of malignant neoplasm of breast: Secondary | ICD-10-CM

## 2019-06-13 DIAGNOSIS — Z87440 Personal history of urinary (tract) infections: Secondary | ICD-10-CM | POA: Diagnosis not present

## 2019-06-13 LAB — BASIC METABOLIC PANEL
Anion gap: 11 (ref 5–15)
BUN: 7 mg/dL — ABNORMAL LOW (ref 8–23)
CO2: 23 mmol/L (ref 22–32)
Calcium: 8.5 mg/dL — ABNORMAL LOW (ref 8.9–10.3)
Chloride: 104 mmol/L (ref 98–111)
Creatinine, Ser: 0.62 mg/dL (ref 0.44–1.00)
GFR calc Af Amer: >60 mL/min (ref >60)
GFR calc non Af Amer: >60 mL/min (ref >60)
Glucose, Bld: 113 mg/dL — ABNORMAL HIGH (ref 70–99)
Potassium: 3.6 mmol/L (ref 3.5–5.1)
Sodium: 138 mmol/L (ref 135–145)

## 2019-06-13 LAB — CBC WITH DIFFERENTIAL/PLATELET
Abs Immature Granulocytes: 0.08 10*3/uL — ABNORMAL HIGH (ref 0.00–0.07)
Basophils Absolute: 0 10*3/uL (ref 0.0–0.1)
Basophils Relative: 0 %
Eosinophils Absolute: 0.1 10*3/uL (ref 0.0–0.5)
Eosinophils Relative: 1 %
HCT: 38.2 % (ref 36.0–46.0)
Hemoglobin: 12.2 g/dL (ref 12.0–15.0)
Immature Granulocytes: 1 %
Lymphocytes Relative: 19 %
Lymphs Abs: 1.6 10*3/uL (ref 0.7–4.0)
MCH: 28.9 pg (ref 26.0–34.0)
MCHC: 31.9 g/dL (ref 30.0–36.0)
MCV: 90.5 fL (ref 80.0–100.0)
Monocytes Absolute: 0.8 10*3/uL (ref 0.1–1.0)
Monocytes Relative: 10 %
Neutro Abs: 5.8 10*3/uL (ref 1.7–7.7)
Neutrophils Relative %: 69 %
Platelets: 137 10*3/uL — ABNORMAL LOW (ref 150–400)
RBC: 4.22 MIL/uL (ref 3.87–5.11)
RDW: 14 % (ref 11.5–15.5)
WBC: 8.4 10*3/uL (ref 4.0–10.5)
nRBC: 0 % (ref 0.0–0.2)

## 2019-06-13 LAB — LACTATE DEHYDROGENASE: LDH: 239 U/L — ABNORMAL HIGH (ref 98–192)

## 2019-06-13 LAB — HEPATIC FUNCTION PANEL
ALT: 19 U/L (ref 0–44)
AST: 18 U/L (ref 15–41)
Albumin: 3 g/dL — ABNORMAL LOW (ref 3.5–5.0)
Alkaline Phosphatase: 43 U/L (ref 38–126)
Bilirubin, Direct: 0.2 mg/dL (ref 0.0–0.2)
Indirect Bilirubin: 0.7 mg/dL (ref 0.3–0.9)
Total Bilirubin: 0.9 mg/dL (ref 0.3–1.2)
Total Protein: 6.6 g/dL (ref 6.5–8.1)

## 2019-06-13 LAB — TRIGLYCERIDES: Triglycerides: 84 mg/dL (ref <150)

## 2019-06-13 LAB — C-REACTIVE PROTEIN: CRP: 15.9 mg/dL — ABNORMAL HIGH (ref <1.0)

## 2019-06-13 LAB — D-DIMER, QUANTITATIVE: D-Dimer, Quant: 1.12 ug/mL-FEU — ABNORMAL HIGH (ref 0.00–0.50)

## 2019-06-13 LAB — PROCALCITONIN: Procalcitonin: <0.1 ng/mL

## 2019-06-13 LAB — TROPONIN I (HIGH SENSITIVITY)
Troponin I (High Sensitivity): 4 ng/L (ref <18)
Troponin I (High Sensitivity): 4 ng/L (ref <18)

## 2019-06-13 LAB — LACTIC ACID, PLASMA: Lactic Acid, Venous: 0.8 mmol/L (ref 0.5–1.9)

## 2019-06-13 LAB — FIBRINOGEN: Fibrinogen: 708 mg/dL — ABNORMAL HIGH (ref 210–475)

## 2019-06-13 LAB — BRAIN NATRIURETIC PEPTIDE: B Natriuretic Peptide: 38 pg/mL (ref 0.0–100.0)

## 2019-06-13 LAB — FERRITIN: Ferritin: 310 ng/mL — ABNORMAL HIGH (ref 11–307)

## 2019-06-13 MED ORDER — SODIUM CHLORIDE 0.9 % IV SOLN: INTRAVENOUS | Status: DC

## 2019-06-13 MED ORDER — DEXAMETHASONE SODIUM PHOSPHATE 10 MG/ML IJ SOLN
10.0000 mg | Freq: Once | INTRAMUSCULAR | Status: AC
  Administered 2019-06-13: 18:00:00 10 mg via INTRAVENOUS
  Filled 2019-06-13: qty 1

## 2019-06-13 MED ORDER — ALBUTEROL SULFATE HFA 108 (90 BASE) MCG/ACT IN AERS
INHALATION_SPRAY | RESPIRATORY_TRACT | Status: AC
  Filled 2019-06-13: qty 6.7

## 2019-06-13 MED ORDER — ALBUTEROL SULFATE HFA 108 (90 BASE) MCG/ACT IN AERS
4.0000 | INHALATION_SPRAY | Freq: Once | RESPIRATORY_TRACT | Status: AC
  Administered 2019-06-13: 17:00:00 4 via RESPIRATORY_TRACT

## 2019-06-13 MED ORDER — SODIUM CHLORIDE 0.9 % IV BOLUS: 500.0000 mL | Freq: Once | INTRAVENOUS | Status: DC

## 2019-06-13 MED ORDER — SODIUM CHLORIDE 0.9 % IV SOLN
1000.0000 mL | INTRAVENOUS | Status: DC
  Administered 2019-06-13: 18:00:00 1000 mL via INTRAVENOUS

## 2019-06-13 MED ORDER — ALBUTEROL SULFATE HFA 108 (90 BASE) MCG/ACT IN AERS: 9.0000 | INHALATION_SPRAY | Freq: Once | RESPIRATORY_TRACT | Status: DC

## 2019-06-13 NOTE — ED Notes (Signed)
Pt reports her husband and son are covid negative   Mandi, RN will call and inform spouse of transfer

## 2019-06-13 NOTE — ED Triage Notes (Signed)
Patient c/o shortness of breath with O2 sats in the 80s at home. Patient tested here in ED on 7/28 for covid-patient was positive. Denies any hx of COPD or breathing difficulties. Patient reports fevers and chills. Patient took tylenol at 2pm today.

## 2019-06-13 NOTE — ED Notes (Signed)
O2 increased to 4L 

## 2019-06-13 NOTE — ED Notes (Signed)
[facility] here for another pt  States the covid truck had two calls and they are unsure when they will transfer pt

## 2019-06-13 NOTE — ED Provider Notes (Signed)
[facility] EMERGENCY DEPARTMENT Provider Note   CSN: [id number] Arrival date & time: 06/13/19  1644    History   Chief Complaint Chief Complaint  Patient presents with  . Shortness of Breath    HPI Brenda [name] is a 63 y.o. female with a history significant for CAD, COPD, hyperlipidemia, h/o GI bleed, aortic stenosis and was diagnosed with covid 19 here on 7/28, presenting with increased weakness and shortness of breath which is ok at rest, triggered by any activity, present for about the past week.  However, her husband checked her oxygen level at home this am when he noted increased work of breathing and was 84% on room air. She continues to have fevers but have been responsive to anti pyretics, last tylnenol taken early this am.  She is using albuterol inhaler given here, also underwent a course of prednisone.  She was also prescribed mucinex DM and tessalon perles by her pcp at an e visit 3 days ago.  She denies chest pain.  Cough has been non productive.  Denies abdominal pain, n/v but does have occasional diarrhea, last occurred yesterday.      The history is provided by the patient.    Past Medical History:  Diagnosis Date  . Aortic valve stenosis    "very mild"  . Atypical chest pain 2007; 2015   cardiolyte neg, echo nl, cath showed mild/nonobstructive LAD disease  . Bilateral primary osteoarthritis of knee 06/30/2012   Right >L.  Right unicompartmental knee arthroplasty 10/25/18  . CAD (coronary artery disease)   . Chronic LBP    Multiple surgeries  . COPD (chronic obstructive pulmonary disease) ([place])   . COVID-19 virus infection 06/02/2019   Eval at [facility] ED and d/c'd home.  . DDD (degenerative disc disease)    spinal stenosis  . Fatty liver   . History of GI bleed    NSAIDS  . Hyperlipidemia    mixed  . Microscopic hematuria    full w/ u unrevealing X 2  . Normal nuclear stress test 11/11 and 06/2014   negative, EF normal  . Palpitations 2006   48H holter  neg  . PONV (postoperative nausea and vomiting)    "never threw up but felt sick on my stomach", also believes that she was aware of surgery during her hysterectomy   . Pre-diabetes   . Prediabetes    Highest A1c 6.1% as of 03/2017  . RBBB (right bundle branch block)   . Recurrent UTI   . Tobacco dependence in remission    Quit fall 2015.  Only occasional bronchitis illness (CT shest 2009 nl- for f/u of ? nodule on CXR)    Patient Active Problem List   Diagnosis Date Noted  . Pneumonia due to COVID-19 virus 06/13/2019  . Primary osteoarthritis of right knee 10/13/2018  . Chronic narcotic use 06/12/2016  . Encounter for chronic pain management 06/12/2016  . Bilateral sciatica 02/10/2016  . Spinal stenosis of lumbar region without neurogenic claudication 02/10/2016  . Gastroesophageal reflux disease without esophagitis 01/30/2016  . Chronic low back pain 10/12/2015  . Unstable angina ([place]) 05/04/2015  . CAD (coronary artery disease) 06/25/2014  . Chest pain 06/25/2014  . Paresthesia of both feet 03/11/2014  . Paresthesia of skin 03/11/2014  . Health maintenance examination 11/12/2013  . Acute exacerbation of chronic obstructive pulmonary disease (COPD) ([place]) 08/13/2013  . Arthritis of knee, degenerative 04/10/2013  . H/O Spinal surgery 03/11/2013  . Degenerative disc disease, lumbar  03/11/2013  . Degenerative arthritis of right knee 03/11/2013  . Anxiety state 03/03/2013  . COPD (chronic obstructive pulmonary disease) ([place]) 03/03/2013  . Generalized anxiety disorder 03/03/2013  . Osteoarthritis of knee 06/30/2012  . Right knee pain 06/26/2012  . Colon cancer screening 02/21/2012  . Breast cancer screening 02/21/2012  . Prediabetes 11/09/2010  . Hyperlipidemia 11/09/2010  . TOBACCO ABUSE 11/09/2010  . BACK PAIN, LUMBAR, CHRONIC 11/09/2010  . HEMATURIA, MICROSCOPIC, HX OF 11/09/2010    Past Surgical History:  Procedure Laterality Date  . ABDOMINAL HYSTERECTOMY  1997   DUB   . APPENDECTOMY  1984  . CARDIAC CATHETERIZATION  10/09/2005   no CAD, mildly elevated LVEDP, normal LV function (Dr. Gerrie Nordmann)  . CARDIAC CATHETERIZATION N/A 05/16/2015   Mild non-obstructive CAD--med mgmt.  Procedure: Left Heart Cath and Coronary Angiography;  Surgeon: Pixie Casino, MD;  Location: [place] [facility];  Service: Cardiovascular;  Laterality: N/A;  . CARDIOVASCULAR STRESS TEST  06/2014   normal lexiscan NST  . CARDIOVASCULAR STRESS TEST  2006   persantine - no ischemia, low risk   . KNEE ARTHROSCOPY Bilateral   . left wrist ganglion cyst excision  2010  . [place]   right iliac crest bone graft+metal instrumentation; 2005 metal removal and decompression, 2011 lumbar decompression 4-11, then stabilization/ instrumentation done 09-19-10: L2,L3, L5 left and L2 , L3, L4 right pedical remnant L4 left embedded. Left iliac crest bone graft-- Dr Velna Ochs  . LUMBAR SPINE SURGERY  02/10/2016   Dr. Velna Ochs: lumbar decompression, instrumentation removal, and fusion exploration--HELPED her a lot, esp her radicular leg pains.  . OOPHORECTOMY Right    cyst  . PARTIAL KNEE ARTHROPLASTY Right 11/04/2018   Procedure: UNICOMPARTMENTAL KNEE;  Surgeon: Renette Butters, MD;  Location: [facility];  Service: Orthopedics;  Laterality: Right;  Adductor Block  . TONSILLECTOMY  63 yrs old  . TONSILLECTOMY    . TRANSTHORACIC ECHOCARDIOGRAM  2006   EF=>55%, trace MR, mild TR, trace AV regurg, trace pulm valve regurg      OB History    Gravida      Para      Term      Preterm      AB      Living  2     SAB      TAB      Ectopic      Multiple      Live Births               Home Medications    Prior to Admission medications   Medication Sig Start Date End Date Taking? Authorizing Provider  acetaminophen (TYLENOL) 500 MG tablet every 6 (six) hours as needed for mild pain or headache.    [provider]  albuterol (PROVENTIL HFA;VENTOLIN HFA) 108 (90  Base) MCG/ACT inhaler Inhale 2 puffs into the lungs every 4 (four) hours as needed for wheezing or shortness of breath (bronchitis). Reported on 03/12/2016 03/14/17   Tammi Sou, MD  benzonatate (TESSALON) 200 MG capsule Take 1 capsule (200 mg total) by mouth 2 (two) times daily as needed for cough. 06/10/19   Kuneff, Renee A, DO  clopidogrel (PLAVIX) 75 MG tablet Take 1 tablet (75 mg total) by mouth daily. 06/05/18   McGowen, Adrian Blackwater, MD  cyclobenzaprine (FLEXERIL) 10 MG tablet take 1 tablet by mouth every 8 hours if needed Patient taking differently: Take 10 mg by mouth 3 (three) times  daily as needed for muscle spasms.  05/05/18   McGowen, Adrian Blackwater, MD  dexamethasone (DECADRON) 4 MG tablet Take 1 tablet (4 mg total) by mouth 2 (two) times daily with a meal. 06/02/19   Lily Kocher, PA-C  dextromethorphan-guaiFENesin [facility] DM) 30-600 MG 12hr tablet Take 1 tablet by mouth 2 (two) times daily. 06/10/19   Kuneff, Renee A, DO  dicyclomine (BENTYL) 10 MG capsule Take 1 capsule (10 mg total) by mouth 4 (four) times daily -  before meals and at bedtime. 06/10/19   Kuneff, Renee A, DO  fenofibrate 160 MG tablet take 1 tablet by mouth once daily with food Patient taking differently: Take 160 mg by mouth daily.  06/05/18   McGowen, Adrian Blackwater, MD  HYDROcodone-homatropine (HYCODAN) 5-1.5 MG/5ML syrup Take 5 mLs by mouth every 6 (six) hours as needed. Patient not taking: Reported on 06/10/2019 06/02/19   Lily Kocher, PA-C  hydrocortisone (ANUSOL-HC) 25 MG suppository Place 1 suppository (25 mg total) rectally 2 (two) times daily. 06/10/19   Kuneff, Renee A, DO  LORazepam (ATIVAN) 1 MG tablet Take 1 tablet (1 mg total) by mouth 2 (two) times daily. 03/19/19   McGowen, Adrian Blackwater, MD  nitroGLYCERIN (NITROSTAT) 0.4 MG SL tablet Place 1 tablet (0.4 mg total) under the tongue every 5 (five) minutes as needed for chest pain (MAX 3 doses.). 08/19/14   Pixie Casino, MD  oxyCODONE (OXY IR/ROXICODONE) 5 MG immediate release  tablet 1-2 po tid prn pain 03/19/19   McGowen, Adrian Blackwater, MD  pantoprazole (PROTONIX) 40 MG tablet Take 1 tablet (40 mg total) by mouth daily. 06/05/18   McGowen, Adrian Blackwater, MD  rosuvastatin (CRESTOR) 40 MG tablet Take 1 tablet (40 mg total) by mouth daily. 06/05/18   McGowen, Adrian Blackwater, MD  sodium chloride (OCEAN) 0.65 % SOLN nasal spray Place 1 spray into both nostrils as needed for congestion.    [provider]    Family History Family History  Problem Relation Age of Onset  . Heart Problems Mother        and thyroid problems  . Hyperlipidemia Mother   . Diabetes Mother   . Breast cancer Mother   . Heart failure Father        CHF, heart attack, diabetes, hyperlipidemia  . Heart disease Brother        back problems  . Hypertension Brother   . Kidney failure Maternal Grandmother   . Stroke Maternal Grandfather   . Cancer Paternal Grandmother        mouth - snuff  . Heart attack Paternal Grandfather        stroke, HTN  . Hyperlipidemia Brother   . Heart attack Brother   . Cancer Brother   . Coronary artery disease Neg Hx     Social History Social History   Tobacco Use  . Smoking status: Former Smoker    Packs/day: 1.00    Years: 35.00    Pack years: 35.00    Types: Cigarettes    Quit date: 07/22/2014    Years since quitting: 4.8  . Smokeless tobacco: Never Used  . Tobacco comment: down to ~2 cigarettes/daily (06/23/14) - Quit Smoking around 07/20/14!  Substance Use Topics  . Alcohol use: No    Alcohol/week: 0.0 standard drinks  . Drug use: No     Allergies   Aspirin, Beta adrenergic blockers, Nsaids, Penicillins, Prochlorperazine edisylate, Sulfonamide derivatives, and Amitriptyline hcl   Review of Systems Review of Systems  Constitutional: Positive for fatigue and fever.  HENT: Negative for congestion and sore throat.   Eyes: Negative.   Respiratory: Positive for shortness of breath. Negative for chest tightness.   Cardiovascular: Negative for chest pain  and palpitations.  Gastrointestinal: Positive for diarrhea. Negative for abdominal pain, nausea and vomiting.  Genitourinary: Negative.   Musculoskeletal: Negative for arthralgias, joint swelling and neck pain.  Skin: Negative.  Negative for rash and wound.  Neurological: Positive for headaches. Negative for dizziness, weakness, light-headedness and numbness.       Reports has transient headache triggered by coughing.  Psychiatric/Behavioral: Negative.      Physical Exam Updated Vital Signs BP (!) 105/59   Pulse 88   Temp 99 F (37.2 C) (Oral)   Resp 20   Ht 5\' 4"  (1.626 m)   Wt 81.6 kg   SpO2 95%   BMI 30.90 kg/m   Physical Exam Vitals signs and nursing note reviewed.  Constitutional:      Appearance: She is well-developed.  HENT:     Head: Normocephalic and atraumatic.     Mouth/Throat:     Mouth: Mucous membranes are dry.  Eyes:     Conjunctiva/sclera: Conjunctivae normal.  Neck:     Musculoskeletal: Normal range of motion.  Cardiovascular:     Rate and Rhythm: Normal rate and regular rhythm.     Heart sounds: Normal heart sounds.  Pulmonary:     Effort: Pulmonary effort is normal.     Breath sounds: Decreased breath sounds present. No wheezing, rhonchi or rales.     Comments: Mild accessory muscle use, seems exacerbated by speaking, but is speaking in full sentences.  Abdominal:     General: Bowel sounds are normal.     Palpations: Abdomen is soft.     Tenderness: There is no abdominal tenderness.  Musculoskeletal: Normal range of motion.  Skin:    General: Skin is warm and dry.  Neurological:     Mental Status: She is alert.      ED Treatments / Results  Labs (all labs ordered are listed, but only abnormal results are displayed) Labs Reviewed  CBC WITH DIFFERENTIAL/PLATELET - Abnormal; Notable for the following components:      Result Value   Platelets 137 (*)    Abs Immature Granulocytes 0.08 (*)    All other components within normal limits   BASIC METABOLIC PANEL - Abnormal; Notable for the following components:   Glucose, Bld 113 (*)    BUN 7 (*)    Calcium 8.5 (*)    All other components within normal limits  D-DIMER, QUANTITATIVE (NOT AT [facility]) - Abnormal; Notable for the following components:   D-Dimer, Quant 1.12 (*)    All other components within normal limits  FERRITIN - Abnormal; Notable for the following components:   Ferritin 310 (*)    All other components within normal limits  FIBRINOGEN - Abnormal; Notable for the following components:   Fibrinogen 708 (*)    All other components within normal limits  C-REACTIVE PROTEIN - Abnormal; Notable for the following components:   CRP 15.9 (*)    All other components within normal limits  LACTATE DEHYDROGENASE - Abnormal; Notable for the following components:   LDH 239 (*)    All other components within normal limits  CULTURE, BLOOD (ROUTINE X 2)  CULTURE, BLOOD (ROUTINE X 2)  LACTIC ACID, PLASMA  PROCALCITONIN  TRIGLYCERIDES  BRAIN NATRIURETIC PEPTIDE  TROPONIN I (HIGH SENSITIVITY)  TROPONIN I (  HIGH SENSITIVITY)    EKG None  Radiology Dg Chest Portable 1 View  Result Date: 06/13/2019 CLINICAL DATA:  Shortness of breath. COVID-19 positive. Fever and chills. EXAM: PORTABLE CHEST 1 VIEW COMPARISON:  06/02/2019 FINDINGS: Midline trachea. Normal heart size. No pleural effusion or pneumothorax. Development of pulmonary interstitial prominence with interstitial opacities bilaterally. No well-defined lobar consolidation. IMPRESSION: Development of relatively diffuse interstitial opacities which, given the clinical history are suspicious for atypical/viral pneumonia. In the appropriate clinical setting, mild pulmonary venous congestion could look similar. Electronically Signed   By: Abigail Miyamoto M.D.   On: 06/13/2019 18:10    Procedures Procedures (including critical care time)  Medications Ordered in ED Medications  0.9 %  sodium chloride infusion (1,000 mLs  Intravenous New Bag/Given 06/13/19 1734)  albuterol (VENTOLIN HFA) 108 (90 Base) MCG/ACT inhaler 4 puff (4 puffs Inhalation Given 06/13/19 1726)  dexamethasone (DECADRON) injection 10 mg (10 mg Intravenous Given 06/13/19 1731)     Initial Impression / Assessment and Plan / ED Course  I have reviewed the triage vital signs and the nursing notes.  Pertinent labs & imaging results that were available during my care of the patient were reviewed by me and considered in my medical decision making (see chart for details).        Pt with known covid with increased sob and hypoxia despite home albuterol mdi, prednisone course. She will require admission for supportive care.  She was placed on 3L [place] with oxygen saturation rising to 94%.  Stable with work of breathing.  Discussed with Dr. Denton Brick who accepts pt for admission.  Final Clinical Impressions(s) / ED Diagnoses   Final diagnoses:  [id number]  Hypoxia  Community acquired pneumonia, unspecified laterality    ED Discharge Orders    None       Landis Martins 06/13/19 Nathanial Millman, MD 06/14/19 [phone]

## 2019-06-13 NOTE — ED Notes (Signed)
Transfer sheet will not print   [place], RN, CN aware

## 2019-06-13 NOTE — ED Notes (Signed)
Pt is very difficult stick. States they usually have trouble starting IVs and getting blood on her at times. No trouble obtaining labs, but IV cathlon will not thread into veins. Only 1 IV started for this reason after multiple sticks by staff.

## 2019-06-13 NOTE — ED Notes (Signed)
Pt attempted to go to [facility] after calling desk and being told she could go by herself   Pt pulled out IV, off monitor leads  Became almost histrionic stating that she was so nervous and upset and scared   Assisted t toilet   Returned to bed after bed changed and pt cleaned - IV start new monitor leads   Pulse ox down to 83 while out of bed- O2 off due to short line

## 2019-06-13 NOTE — ED Notes (Signed)
Call from [place] has tested positive for Covid  She has increasing shortness of breath and has been directed to come to [facility]  For assessment and transfer

## 2019-06-13 NOTE — ED Notes (Signed)
Dr E at bedside

## 2019-06-13 NOTE — ED Notes (Signed)
Call to spouse   Alvester Chou  [phone]  Informed of wife transfer pending and given both room number and number of hospital

## 2019-06-13 NOTE — ED Notes (Signed)
Report to [facility]

## 2019-06-13 NOTE — ED Notes (Signed)
Pt out of bed to [facility]  Urinated and returned to bed   She speaks in complete sentences although her pulse ox is 89 on RA

## 2019-06-13 NOTE — H&P (Signed)
History and Physical    Brenda [name]:[id number] DOB: [birth year] DOA: 06/13/2019  PCP: Tammi Sou, MD   Patient coming from: Home  I have personally briefly reviewed patient's old medical records in [place]  Chief Complaint: SOB, Cough  HPI: Marcedes MALIHA [name] is a 63 y.o. female with medical history significant for prediabetes mellitus, COPD, coronary artery disease, fatty liver, presented to the ED with increasing difficulty breathing, cough, fever and chills.  Per ED note 7/28, patient was in close contact with someone on Sunday in church, who recently tested positive for COVID-19. Patient got tested for COVID 06/02/2019 and 2 tests on that day came back positive.  Patient self quarantined at home, spouse and son tested negative.  Patient was prescribed steroids by her primary care provider.  Today with increasing difficulty breathing, patient spouse who had COPD, used his home pulse ox to check patient's O2 saturation-rate 84%. Patient came to the ED.  ED Course: O2 sats 84% on room air-improvement to greater than 95% on 3 L nasal cannula, blood pressure systolic 222 - 979G..  D-dimer elevated 1.12, ferritin 310, fibrinogen 708, CRP 15.9. Negative Hs Trop.  Blood cultures obtained.  Port chest x-ray-development of relatively diffuse interstitial opacities which are suspicious for atypical/viral pneumonia.  Mild pulmonary venous congestion could look similar.  Patient was given 10 mg of Decadron IV.  Hospitalist to admit for COVID-19 pneumonia.  Review of Systems: As per HPI all other systems reviewed and negative.  Past Medical History:  Diagnosis Date  . Aortic valve stenosis    "very mild"  . Atypical chest pain 2007; 2015   cardiolyte neg, echo nl, cath showed mild/nonobstructive LAD disease  . Bilateral primary osteoarthritis of knee 06/30/2012   Right >L.  Right unicompartmental knee arthroplasty 10/25/18  . CAD (coronary artery disease)   . Chronic LBP    Multiple surgeries  . COPD (chronic obstructive pulmonary disease) ([place])   . COVID-19 virus infection 06/02/2019   Eval at [facility] ED and d/c'd home.  . DDD (degenerative disc disease)    spinal stenosis  . Fatty liver   . History of GI bleed    NSAIDS  . Hyperlipidemia    mixed  . Microscopic hematuria    full w/ u unrevealing X 2  . Normal nuclear stress test 11/11 and 06/2014   negative, EF normal  . Palpitations 2006   48H holter neg  . PONV (postoperative nausea and vomiting)    "never threw up but felt sick on my stomach", also believes that she was aware of surgery during her hysterectomy   . Pre-diabetes   . Prediabetes    Highest A1c 6.1% as of 03/2017  . RBBB (right bundle branch block)   . Recurrent UTI   . Tobacco dependence in remission    Quit fall 2015.  Only occasional bronchitis illness (CT shest 2009 nl- for f/u of ? nodule on CXR)    Past Surgical History:  Procedure Laterality Date  . ABDOMINAL HYSTERECTOMY  1997   DUB  . APPENDECTOMY  1984  . CARDIAC CATHETERIZATION  10/09/2005   no CAD, mildly elevated LVEDP, normal LV function (Dr. Gerrie Nordmann)  . CARDIAC CATHETERIZATION N/A 05/16/2015   Mild non-obstructive CAD--med mgmt.  Procedure: Left Heart Cath and Coronary Angiography;  Surgeon: Pixie Casino, MD;  Location: [place] [facility];  Service: Cardiovascular;  Laterality: N/A;  . CARDIOVASCULAR STRESS TEST  06/2014   normal lexiscan  NST  . CARDIOVASCULAR STRESS TEST  2006   persantine - no ischemia, low risk   . KNEE ARTHROSCOPY Bilateral   . left wrist ganglion cyst excision  2010  . [place]   right iliac crest bone graft+metal instrumentation; 2005 metal removal and decompression, 2011 lumbar decompression 4-11, then stabilization/ instrumentation done 09-19-10: L2,L3, L5 left and L2 , L3, L4 right pedical remnant L4 left embedded. Left iliac crest bone graft-- Dr Velna Ochs  . LUMBAR SPINE SURGERY  02/10/2016   Dr. Velna Ochs: lumbar  decompression, instrumentation removal, and fusion exploration--HELPED her a lot, esp her radicular leg pains.  . OOPHORECTOMY Right    cyst  . PARTIAL KNEE ARTHROPLASTY Right 11/04/2018   Procedure: UNICOMPARTMENTAL KNEE;  Surgeon: Renette Butters, MD;  Location: [facility];  Service: Orthopedics;  Laterality: Right;  Adductor Block  . TONSILLECTOMY  63 yrs old  . TONSILLECTOMY    . TRANSTHORACIC ECHOCARDIOGRAM  2006   EF=>55%, trace MR, mild TR, trace AV regurg, trace pulm valve regurg      reports that she quit smoking about 4 years ago. Her smoking use included cigarettes. She has a 35.00 pack-year smoking history. She has never used smokeless tobacco. She reports that she does not drink alcohol or use drugs.  Allergies  Allergen Reactions  . Aspirin Other (See Comments)     GI Bleed  . Beta Adrenergic Blockers Other (See Comments)    REACTION: decreased libido  . Nsaids Other (See Comments)     GI Upset  . Penicillins Swelling and Other (See Comments)    DID THE REACTION INVOLVE: Swelling of the face/tongue/throat, SOB, or low BP? Yes Sudden or severe rash/hives, skin peeling, or the inside of the mouth or nose? Yes Did it require medical treatment? Yes When did it last happen? 63 years old If all above answers are "NO", may proceed with cephalosporin use.   Marland Kitchen Prochlorperazine Edisylate Other (See Comments)     Stroke like symptoms  . Sulfonamide Derivatives Other (See Comments)    Unknown allergic reaction  . Amitriptyline Hcl Palpitations and Other (See Comments)     increased heart rate    Family History  Problem Relation Age of Onset  . Heart Problems Mother        and thyroid problems  . Hyperlipidemia Mother   . Diabetes Mother   . Breast cancer Mother   . Heart failure Father        CHF, heart attack, diabetes, hyperlipidemia  . Heart disease Brother        back problems  . Hypertension Brother   . Kidney failure Maternal Grandmother   . Stroke Maternal  Grandfather   . Cancer Paternal Grandmother        mouth - snuff  . Heart attack Paternal Grandfather        stroke, HTN  . Hyperlipidemia Brother   . Heart attack Brother   . Cancer Brother   . Coronary artery disease Neg Hx     Prior to Admission medications   Medication Sig Start Date End Date Taking? Authorizing Provider  albuterol (PROVENTIL HFA;VENTOLIN HFA) 108 (90 Base) MCG/ACT inhaler Inhale 2 puffs into the lungs every 4 (four) hours as needed for wheezing or shortness of breath (bronchitis). Reported on 03/12/2016 03/14/17  Yes McGowen, Adrian Blackwater, MD  clopidogrel (PLAVIX) 75 MG tablet Take 1 tablet (75 mg total) by mouth daily. 06/05/18  Yes McGowen, Adrian Blackwater, MD  cyclobenzaprine (FLEXERIL) 10 MG tablet take 1 tablet by mouth every 8 hours if needed Patient taking differently: Take 10 mg by mouth 3 (three) times daily as needed for muscle spasms.  05/05/18  Yes McGowen, Adrian Blackwater, MD  dexamethasone (DECADRON) 4 MG tablet Take 1 tablet (4 mg total) by mouth 2 (two) times daily with a meal. 06/02/19  Yes Lily Kocher, PA-C  dicyclomine (BENTYL) 10 MG capsule Take 1 capsule (10 mg total) by mouth 4 (four) times daily -  before meals and at bedtime. 06/10/19  Yes Kuneff, Renee A, DO  fenofibrate 160 MG tablet take 1 tablet by mouth once daily with food Patient taking differently: Take 160 mg by mouth daily.  06/05/18  Yes McGowen, Adrian Blackwater, MD  LORazepam (ATIVAN) 1 MG tablet Take 1 tablet (1 mg total) by mouth 2 (two) times daily. 03/19/19  Yes McGowen, Adrian Blackwater, MD  pantoprazole (PROTONIX) 40 MG tablet Take 1 tablet (40 mg total) by mouth daily. 06/05/18  Yes McGowen, Adrian Blackwater, MD  rosuvastatin (CRESTOR) 40 MG tablet Take 1 tablet (40 mg total) by mouth daily. 06/05/18  Yes McGowen, Adrian Blackwater, MD  sodium chloride (OCEAN) 0.65 % SOLN nasal spray Place 1 spray into both nostrils as needed for congestion.   Yes [provider]  acetaminophen (TYLENOL) 500 MG tablet every 6 (six) hours as needed  for mild pain or headache.    [provider]  benzonatate (TESSALON) 200 MG capsule Take 1 capsule (200 mg total) by mouth 2 (two) times daily as needed for cough. 06/10/19   Kuneff, Renee A, DO  dextromethorphan-guaiFENesin (MUCINEX DM) 30-600 MG 12hr tablet Take 1 tablet by mouth 2 (two) times daily. 06/10/19   Kuneff, Renee A, DO  HYDROcodone-homatropine (HYCODAN) 5-1.5 MG/5ML syrup Take 5 mLs by mouth every 6 (six) hours as needed. Patient not taking: Reported on 06/10/2019 06/02/19   Lily Kocher, PA-C  hydrocortisone (ANUSOL-HC) 25 MG suppository Place 1 suppository (25 mg total) rectally 2 (two) times daily. 06/10/19   Kuneff, Renee A, DO  nitroGLYCERIN (NITROSTAT) 0.4 MG SL tablet Place 1 tablet (0.4 mg total) under the tongue every 5 (five) minutes as needed for chest pain (MAX 3 doses.). 08/19/14   Pixie Casino, MD  oxyCODONE (OXY IR/ROXICODONE) 5 MG immediate release tablet 1-2 po tid prn pain 03/19/19   Tammi Sou, MD    Physical Exam: Vitals:   06/13/19 2000 06/13/19 2012 06/13/19 2015 06/13/19 2024  BP: (!) 66/39     Pulse: 85 83 82 82  Resp: 18 17 16    Temp:      TempSrc:      SpO2: 94% 95% 96% 98%  Weight:      Height:        Constitutional: NAD, calm, comfortable Vitals:   06/13/19 2000 06/13/19 2012 06/13/19 2015 06/13/19 2024  BP: (!) 66/39     Pulse: 85 83 82 82  Resp: 18 17 16    Temp:      TempSrc:      SpO2: 94% 95% 96% 98%  Weight:      Height:       Eyes: PERRL, lids and conjunctivae normal ENMT: Mucous membranes are moist. Posterior pharynx clear of any exudate or lesions. Neck: normal, supple, no masses, no thyromegaly Respiratory: 3 L nasal cannula, diffuse bronchial breath sounds, no wheezing or rhonchi appreciated,. Normal respiratory effort. No accessory muscle use.  Cardiovascular: Regular rate and rhythm, no murmurs /  rubs / gallops. No extremity edema. 2+ pedal pulses.   Abdomen: no tenderness, no masses palpated. No  hepatosplenomegaly. Bowel sounds positive.  Musculoskeletal: no clubbing / cyanosis. No joint deformity upper and lower extremities. Good ROM, no contractures. Normal muscle tone.  Skin: no rashes, lesions, ulcers. No induration Neurologic: CN 2-12 grossly intact.  Strength 5/5 in all 4.  Psychiatric: Normal judgment and insight. Alert and oriented x 3. Normal mood.   Labs on Admission: I have personally reviewed following labs and imaging studies  CBC: Recent Labs  Lab 06/13/19 1726  WBC 8.4  NEUTROABS 5.8  HGB 12.2  HCT 38.2  MCV 90.5  PLT 417*   Basic Metabolic Panel: Recent Labs  Lab 06/13/19 1726  NA 138  K 3.6  CL 104  CO2 23  GLUCOSE 113*  BUN 7*  CREATININE 0.62  CALCIUM 8.5*   Lipid Profile: Recent Labs    06/13/19 1727  TRIG 84   Anemia Panel: Recent Labs    06/13/19 1727  FERRITIN 310*   Urine analysis:    Component Value Date/Time   COLORURINE YELLOW 11/26/2018 0852   APPEARANCEUR CLEAR 11/26/2018 0852   LABSPEC 1.009 11/26/2018 0852   PHURINE 6.0 11/26/2018 0852   GLUCOSEU NEGATIVE 11/26/2018 0852   HGBUR SMALL (A) 11/26/2018 0852   BILIRUBINUR NEGATIVE 11/26/2018 0852   BILIRUBINUR negative 08/21/2018 1031   KETONESUR NEGATIVE 11/26/2018 0852   PROTEINUR NEGATIVE 11/26/2018 0852   UROBILINOGEN 0.2 08/21/2018 1031   UROBILINOGEN 0.2 08/21/2013 2259   NITRITE NEGATIVE 11/26/2018 0852   LEUKOCYTESUR NEGATIVE 11/26/2018 0852    Radiological Exams on Admission: Dg Chest Portable 1 View  Result Date: 06/13/2019 CLINICAL DATA:  Shortness of breath. COVID-19 positive. Fever and chills. EXAM: PORTABLE CHEST 1 VIEW COMPARISON:  06/02/2019 FINDINGS: Midline trachea. Normal heart size. No pleural effusion or pneumothorax. Development of pulmonary interstitial prominence with interstitial opacities bilaterally. No well-defined lobar consolidation. IMPRESSION: Development of relatively diffuse interstitial opacities which, given the clinical history  are suspicious for atypical/viral pneumonia. In the appropriate clinical setting, mild pulmonary venous congestion could look similar. Electronically Signed   By: Abigail Miyamoto M.D.   On: 06/13/2019 18:10    EKG: Independently reviewed.  Sinus rhythm, right bundle branch block.  QTc 472.  Assessment/Plan Active Problems:   Pneumonia due to COVID-19 virus    Pneumonia due to COVID-19 virus-dyspnea, hypoxic O2 sats 84% on room air, requiring 3 L O2 via nasal cannula, chest x-ray shows diffuse interstitial opacities.  WBC 12.2.  Normal lactic acid 0.8.  Unremarkable high-sensitivity troponin.  Elevation of some inflammatory markers, CRP 15.9, ferritin 310, d-dimer 1.12. -Pharmacy consult for Remdesmivir and Dexametasone -COVID-19 admission protocol -Combivent inhaler as needed. -Check interleukin-6, -Daily ferritin, d-dimer,  - CBC in a.m. -Vitamin C, zinc,  COPD hx- appears stable.  -PRN Combivent inhaler, steroids.  Prediabetes-random glucose 113. HgbA1c 06/2018- 5.9. -Monitor glucose while on steroids.  Chronic anxiety: On chronic benzodiazepines. - Resume home on lorazepam 1 mg bid, to avoid withdrawal seizures.  Coronary artery disease history-stable.  Cardiac cath 2016 shows mild nonobstructive coronary artery disease.  Patient was started on Plavix. -Resume home plavix.  HIV as part of routine health screening  DVT prophylaxis: lovenox Code Status: Full Family Communication: None at bedside Disposition Plan: Per rounding team Consults called: None Admission status: Inpatient, telemetry I certify that at the point of admission it is my clinical judgment that the patient will require inpatient hospital care spanning beyond  2 midnights from the point of admission due to high intensity of service, high risk for further deterioration and high frequency of surveillance required. The following factors support the patient status of inpatient: COVID-19 pneumonia with hypoxia requiring  supplemental O2, and antiviral agents with steroids.   Bethena Roys MD [facility]  06/13/2019, 9:44 PM

## 2019-06-13 NOTE — ED Notes (Signed)
In to check pt-   She is sleeping and has pulled her IV out

## 2019-06-13 NOTE — ED Notes (Signed)
Awaiting transport.

## 2019-06-14 LAB — CBC WITH DIFFERENTIAL/PLATELET
Abs Immature Granulocytes: 0.02 10*3/uL (ref 0.00–0.07)
Basophils Absolute: 0 10*3/uL (ref 0.0–0.1)
Basophils Relative: 0 %
Eosinophils Absolute: 0 10*3/uL (ref 0.0–0.5)
Eosinophils Relative: 0 %
HCT: 36.7 % (ref 36.0–46.0)
Hemoglobin: 11.7 g/dL — ABNORMAL LOW (ref 12.0–15.0)
Immature Granulocytes: 1 %
Lymphocytes Relative: 11 %
Lymphs Abs: 0.5 10*3/uL — ABNORMAL LOW (ref 0.7–4.0)
MCH: 28.6 pg (ref 26.0–34.0)
MCHC: 31.9 g/dL (ref 30.0–36.0)
MCV: 89.7 fL (ref 80.0–100.0)
Monocytes Absolute: 0.1 10*3/uL (ref 0.1–1.0)
Monocytes Relative: 2 %
Neutro Abs: 3.8 10*3/uL (ref 1.7–7.7)
Neutrophils Relative %: 86 %
Platelets: 147 10*3/uL — ABNORMAL LOW (ref 150–400)
RBC: 4.09 MIL/uL (ref 3.87–5.11)
RDW: 13.9 % (ref 11.5–15.5)
WBC: 4.4 10*3/uL (ref 4.0–10.5)
nRBC: 0 % (ref 0.0–0.2)

## 2019-06-14 LAB — BASIC METABOLIC PANEL
Anion gap: 9 (ref 5–15)
BUN: 9 mg/dL (ref 8–23)
CO2: 26 mmol/L (ref 22–32)
Calcium: 8.7 mg/dL — ABNORMAL LOW (ref 8.9–10.3)
Chloride: 107 mmol/L (ref 98–111)
Creatinine, Ser: 0.48 mg/dL (ref 0.44–1.00)
GFR calc Af Amer: >60 mL/min (ref >60)
GFR calc non Af Amer: >60 mL/min (ref >60)
Glucose, Bld: 215 mg/dL — ABNORMAL HIGH (ref 70–99)
Potassium: 3.7 mmol/L (ref 3.5–5.1)
Sodium: 142 mmol/L (ref 135–145)

## 2019-06-14 LAB — HEMOGLOBIN A1C
Hgb A1c MFr Bld: 6.4 % — ABNORMAL HIGH (ref 4.8–5.6)
Mean Plasma Glucose: 136.98 mg/dL

## 2019-06-14 LAB — HIV ANTIBODY (ROUTINE TESTING W REFLEX): HIV Screen 4th Generation wRfx: NONREACTIVE

## 2019-06-14 LAB — GLUCOSE, CAPILLARY
Glucose-Capillary: 190 mg/dL — ABNORMAL HIGH (ref 70–99)
Glucose-Capillary: 146 mg/dL — ABNORMAL HIGH (ref 70–99)
Glucose-Capillary: 160 mg/dL — ABNORMAL HIGH (ref 70–99)
Glucose-Capillary: 147 mg/dL — ABNORMAL HIGH (ref 70–99)

## 2019-06-14 LAB — FERRITIN: Ferritin: 288 ng/mL (ref 11–307)

## 2019-06-14 LAB — C-REACTIVE PROTEIN: CRP: 16.5 mg/dL — ABNORMAL HIGH (ref <1.0)

## 2019-06-14 LAB — ABO/RH: ABO/RH(D): A POS

## 2019-06-14 LAB — D-DIMER, QUANTITATIVE: D-Dimer, Quant: 1.19 ug/mL-FEU — ABNORMAL HIGH (ref 0.00–0.50)

## 2019-06-14 MED ORDER — LORAZEPAM 0.5 MG PO TABS
1.0000 mg | ORAL_TABLET | Freq: Two times a day (BID) | ORAL | Status: DC
  Administered 2019-06-14 – 2019-06-18 (×10): 1 mg via ORAL
  Filled 2019-06-14 (×10): qty 2

## 2019-06-14 MED ORDER — SODIUM CHLORIDE 0.9 % IV SOLN
200.0000 mg | Freq: Once | INTRAVENOUS | Status: AC
  Administered 2019-06-14: 200 mg via INTRAVENOUS
  Filled 2019-06-14: qty 40

## 2019-06-14 MED ORDER — FENOFIBRATE 160 MG PO TABS
160.0000 mg | ORAL_TABLET | Freq: Every day | ORAL | Status: DC
  Administered 2019-06-14 – 2019-06-18 (×5): 160 mg via ORAL
  Filled 2019-06-14 (×6): qty 1

## 2019-06-14 MED ORDER — OXYCODONE HCL 5 MG PO TABS
5.0000 mg | ORAL_TABLET | Freq: Three times a day (TID) | ORAL | Status: DC | PRN
  Administered 2019-06-14: 5 mg via ORAL
  Administered 2019-06-14 – 2019-06-15 (×2): 10 mg via ORAL
  Administered 2019-06-15: 03:00:00 5 mg via ORAL
  Administered 2019-06-15 – 2019-06-18 (×5): 10 mg via ORAL
  Filled 2019-06-14 (×2): qty 2
  Filled 2019-06-14: qty 1
  Filled 2019-06-14 (×3): qty 2
  Filled 2019-06-14: qty 1
  Filled 2019-06-14 (×2): qty 2

## 2019-06-14 MED ORDER — ONDANSETRON HCL 4 MG PO TABS: 4.0000 mg | ORAL_TABLET | Freq: Four times a day (QID) | ORAL | Status: DC | PRN

## 2019-06-14 MED ORDER — CYCLOBENZAPRINE HCL 10 MG PO TABS
10.0000 mg | ORAL_TABLET | Freq: Three times a day (TID) | ORAL | Status: DC | PRN
  Administered 2019-06-14 – 2019-06-16 (×2): 10 mg via ORAL
  Filled 2019-06-14 (×4): qty 1

## 2019-06-14 MED ORDER — ACETAMINOPHEN 325 MG PO TABS: 650.0000 mg | ORAL_TABLET | Freq: Four times a day (QID) | ORAL | Status: DC | PRN

## 2019-06-14 MED ORDER — PANTOPRAZOLE SODIUM 40 MG PO TBEC
40.0000 mg | DELAYED_RELEASE_TABLET | Freq: Every day | ORAL | Status: DC
  Administered 2019-06-14 – 2019-06-18 (×5): 40 mg via ORAL
  Filled 2019-06-14 (×5): qty 1

## 2019-06-14 MED ORDER — CLOPIDOGREL BISULFATE 75 MG PO TABS
75.0000 mg | ORAL_TABLET | Freq: Every day | ORAL | Status: DC
  Administered 2019-06-14 – 2019-06-18 (×5): 75 mg via ORAL
  Filled 2019-06-14 (×5): qty 1

## 2019-06-14 MED ORDER — DEXAMETHASONE SODIUM PHOSPHATE 10 MG/ML IJ SOLN
6.0000 mg | INTRAMUSCULAR | Status: DC
  Administered 2019-06-14 – 2019-06-18 (×5): 6 mg via INTRAVENOUS
  Filled 2019-06-14 (×5): qty 1

## 2019-06-14 MED ORDER — OXYCODONE HCL 5 MG PO TABS: 5.0000 mg | ORAL_TABLET | Freq: Four times a day (QID) | ORAL | Status: DC | PRN

## 2019-06-14 MED ORDER — DICYCLOMINE HCL 10 MG PO CAPS
10.0000 mg | ORAL_CAPSULE | Freq: Three times a day (TID) | ORAL | Status: DC
  Administered 2019-06-14 – 2019-06-18 (×16): 10 mg via ORAL
  Filled 2019-06-14 (×23): qty 1

## 2019-06-14 MED ORDER — ONDANSETRON HCL 4 MG/2ML IJ SOLN
4.0000 mg | Freq: Four times a day (QID) | INTRAMUSCULAR | Status: DC | PRN
  Administered 2019-06-14 – 2019-06-15 (×3): 4 mg via INTRAVENOUS
  Filled 2019-06-14 (×3): qty 2

## 2019-06-14 MED ORDER — SODIUM CHLORIDE 0.9 % IV SOLN
100.0000 mg | INTRAVENOUS | Status: AC
  Administered 2019-06-14 – 2019-06-17 (×4): 100 mg via INTRAVENOUS
  Filled 2019-06-14 (×4): qty 20

## 2019-06-14 MED ORDER — TOCILIZUMAB 400 MG/20ML IV SOLN
8.0000 mg/kg | Freq: Once | INTRAVENOUS | Status: AC
  Administered 2019-06-14: 12:00:00 652 mg via INTRAVENOUS
  Filled 2019-06-14: qty 32.6

## 2019-06-14 MED ORDER — DM-GUAIFENESIN ER 30-600 MG PO TB12
1.0000 | ORAL_TABLET | Freq: Two times a day (BID) | ORAL | Status: DC
  Administered 2019-06-14 – 2019-06-18 (×10): 1 via ORAL
  Filled 2019-06-14 (×11): qty 1

## 2019-06-14 MED ORDER — POLYETHYLENE GLYCOL 3350 17 G PO PACK
17.0000 g | PACK | Freq: Every day | ORAL | Status: DC | PRN
  Administered 2019-06-17: 12:00:00 17 g via ORAL
  Filled 2019-06-14: qty 1

## 2019-06-14 MED ORDER — ZINC SULFATE 220 (50 ZN) MG PO CAPS
220.0000 mg | ORAL_CAPSULE | Freq: Every day | ORAL | Status: DC
  Administered 2019-06-14 – 2019-06-18 (×5): 220 mg via ORAL
  Filled 2019-06-14 (×5): qty 1

## 2019-06-14 MED ORDER — VITAMIN C 500 MG PO TABS
500.0000 mg | ORAL_TABLET | Freq: Every day | ORAL | Status: DC
  Administered 2019-06-14 – 2019-06-18 (×5): 500 mg via ORAL
  Filled 2019-06-14 (×5): qty 1

## 2019-06-14 MED ORDER — ENOXAPARIN SODIUM 40 MG/0.4ML ~~LOC~~ SOLN
40.0000 mg | SUBCUTANEOUS | Status: DC
  Administered 2019-06-14 – 2019-06-18 (×5): 40 mg via SUBCUTANEOUS
  Filled 2019-06-14 (×5): qty 0.4

## 2019-06-14 MED ORDER — IPRATROPIUM-ALBUTEROL 20-100 MCG/ACT IN AERS
1.0000 | INHALATION_SPRAY | Freq: Four times a day (QID) | RESPIRATORY_TRACT | Status: DC
  Administered 2019-06-14 – 2019-06-18 (×18): 1 via RESPIRATORY_TRACT
  Filled 2019-06-14: qty 4

## 2019-06-14 MED ORDER — INSULIN ASPART 100 UNIT/ML ~~LOC~~ SOLN
0.0000 [IU] | Freq: Every day | SUBCUTANEOUS | Status: DC
  Administered 2019-06-15: 21:00:00 2 [IU] via SUBCUTANEOUS

## 2019-06-14 MED ORDER — ROSUVASTATIN CALCIUM 20 MG PO TABS
40.0000 mg | ORAL_TABLET | Freq: Every day | ORAL | Status: DC
  Administered 2019-06-14 – 2019-06-18 (×5): 40 mg via ORAL
  Filled 2019-06-14 (×5): qty 2

## 2019-06-14 MED ORDER — INSULIN ASPART 100 UNIT/ML ~~LOC~~ SOLN
0.0000 [IU] | Freq: Three times a day (TID) | SUBCUTANEOUS | Status: DC
  Administered 2019-06-14 (×2): 3 [IU] via SUBCUTANEOUS
  Administered 2019-06-14 – 2019-06-15 (×2): 2 [IU] via SUBCUTANEOUS
  Administered 2019-06-15 (×2): 3 [IU] via SUBCUTANEOUS
  Administered 2019-06-16: 5 [IU] via SUBCUTANEOUS
  Administered 2019-06-16: 08:00:00 2 [IU] via SUBCUTANEOUS
  Administered 2019-06-16 – 2019-06-17 (×2): 3 [IU] via SUBCUTANEOUS
  Administered 2019-06-17: 13:00:00 2 [IU] via SUBCUTANEOUS

## 2019-06-14 MED ORDER — SODIUM CHLORIDE 0.9 % IV SOLN
INTRAVENOUS | Status: DC | PRN
  Administered 2019-06-14: 250 mL via INTRAVENOUS

## 2019-06-14 NOTE — Progress Notes (Signed)
PROGRESS NOTE  IVETT [name]  EXB:[id number] DOB: [birth year] DOA: 06/13/2019 PCP: Tammi Sou, MD   Brief Narrative: Brenda [name] is a 63 y.o. female with a history of CAD, COPD, and prediabetes who presented with several days of fever, chills, night sweats and a day of progressive shortness of breath. She had a positive covid exposure on 8/2, developed symptoms a couple days later. She was forced to take EMS to the ED by her husband who found her to be hypoxic at home. SpO2 was 84% on arrival, inflammatory markers elevated and CXR demonstrated diffuse bilateral opacities. Steroids were given and the patient admitted to [facility] where remdesivir was started and, after discussion of off-label nature, tocilizumab was administered.  Assessment & Plan: Active Problems:   Pneumonia due to COVID-19 virus  Acute hypoxic respiratory failure due to covid-19 pneumonia: Felt to be at risk of progression to ARDS based on current assessment. - Continue airborne, contact precautions. - Check daily labs: CBC w/diff, CMP, d-dimer, ferritin, CRP - Maintain euvolemia/net negative.  - Avoid NSAIDs - Recommend proning and aggressive use of incentive spirometry. - Continue steroids - Continue remdesivir, planning 5 day course. The treatment plan and use of medications and known side effects were discussed with patient, they were clearly explained that this medication is being used based on early clinical trials. Complete risks and long-term side effects are unknown, however in the best clinical judgment they seem to be of some clinical benefit rather than medical risks.  Patient agrees with the treatment plan and want to receive the given medications.  - Start actemra at 8mg /kg IV x1. Has clearly inflammatory phenotype with high fevers, severely elevated inflammatory markers. They are not known to be on immunomodulators, anti-rejection medications, or cancer chemotherapy, do not have another known active  infection of diverticulitis/perforation. Platelets are >50k, ANC is normal, and ALT is normal with no known hepatitis B infection. The off-label nature of this medication was discussed with the patient and the results of the covacta trial were also discussed. The patient chooses to proceed as the potential benefits are felt to outweigh risks at this time. Will monitor LFTs and for infusion reaction.  Prediabetes: HbA1c 5.9%.  - SSI, anticipate steroid-induced hyperglycemia  Anxiety:  - Continue home medication  CAD: No chest pain. Mild nonobstructive disease on LHC 2016.  - Continue plavix.   COPD:  - Continue albuterol, steroids as above. No wheezing on exam.   DVT prophylaxis: Lovenox Code Status: Full Family Communication: Husband by phone Disposition Plan: Uncertain, will get PT/OT  Consultants:   None  Procedures:   None  Antimicrobials:  Remdesivir 8/8 - 8/12   Subjective: Shortness of breath is moderate, unchanged since admission, feels hot and weak. No chest pain or LE swelling.   Objective: Vitals:   06/13/19 2200 06/13/19 2230 06/14/19 0400 06/14/19 0427  BP: (!) 110/57 125/64  102/64  Pulse: 78 81 66   Resp:   13   Temp:    98.9 F (37.2 C)  TempSrc:    Oral  SpO2: 92% 90% 95%   Weight:      Height:       No intake or output data in the 24 hours ending 06/14/19 1212 Filed Weights   06/13/19 1702  Weight: 81.6 kg    Gen: 63 y.o. female in no distress  Pulm: Non-labored breathing supplemental oxygen. Cough with any deep inspiration, rhonchi bilaterally without wheezes.  CV: Regular rate and rhythm. No  murmur, rub, or gallop. No JVD, no pedal edema. GI: Abdomen soft, non-tender, non-distended, with normoactive bowel sounds. No organomegaly or masses felt. Ext: Warm, no deformities Skin: No rashes, lesions no ulcers Neuro: Alert and oriented. No focal neurological deficits. Psych: Judgement and insight appear normal. Mood & affect appropriate.    Data Reviewed: I have personally reviewed following labs and imaging studies  CBC: Recent Labs  Lab 06/13/19 1726 06/14/19 0215  WBC 8.4 4.4  NEUTROABS 5.8 3.8  HGB 12.2 11.7*  HCT 38.2 36.7  MCV 90.5 89.7  PLT 137* 614*   Basic Metabolic Panel: Recent Labs  Lab 06/13/19 1726 06/14/19 0215  NA 138 142  K 3.6 3.7  CL 104 107  CO2 23 26  GLUCOSE 113* 215*  BUN 7* 9  CREATININE 0.62 0.48  CALCIUM 8.5* 8.7*   GFR: Estimated Creatinine Clearance: 74.4 mL/min (by C-G formula based on SCr of 0.48 mg/dL). Liver Function Tests: Recent Labs  Lab 06/13/19 1726  AST 18  ALT 19  ALKPHOS 43  BILITOT 0.9  PROT 6.6  ALBUMIN 3.0*   No results for input(s): LIPASE, AMYLASE in the last 168 hours. No results for input(s): AMMONIA in the last 168 hours. Coagulation Profile: No results for input(s): INR, PROTIME in the last 168 hours. Cardiac Enzymes: No results for input(s): CKTOTAL, CKMB, CKMBINDEX, TROPONINI in the last 168 hours. BNP (last 3 results) No results for input(s): PROBNP in the last 8760 hours. HbA1C: No results for input(s): HGBA1C in the last 72 hours. CBG: Recent Labs  Lab 06/14/19 0810  GLUCAP 190*   Lipid Profile: Recent Labs    06/13/19 1727  TRIG 84   Thyroid Function Tests: No results for input(s): TSH, T4TOTAL, FREET4, T3FREE, THYROIDAB in the last 72 hours. Anemia Panel: Recent Labs    06/13/19 1727 06/14/19 0215  FERRITIN 310* 288   Urine analysis:    Component Value Date/Time   COLORURINE YELLOW 11/26/2018 0852   APPEARANCEUR CLEAR 11/26/2018 0852   LABSPEC 1.009 11/26/2018 0852   PHURINE 6.0 11/26/2018 0852   GLUCOSEU NEGATIVE 11/26/2018 0852   HGBUR SMALL (A) 11/26/2018 0852   BILIRUBINUR NEGATIVE 11/26/2018 0852   BILIRUBINUR negative 08/21/2018 1031   KETONESUR NEGATIVE 11/26/2018 0852   PROTEINUR NEGATIVE 11/26/2018 0852   UROBILINOGEN 0.2 08/21/2018 1031   UROBILINOGEN 0.2 08/21/2013 2259   NITRITE NEGATIVE 11/26/2018  0852   LEUKOCYTESUR NEGATIVE 11/26/2018 0852   Recent Results (from the past 240 hour(s))  Blood Culture (routine x 2)     Status: None (Preliminary result)   Collection Time: 06/13/19  5:27 PM   Specimen: BLOOD LEFT ARM  Result Value Ref Range Status   Specimen Description BLOOD LEFT ARM  Final   Special Requests   Final    BOTTLES DRAWN AEROBIC AND ANAEROBIC Blood Culture adequate volume   Culture   Final    NO GROWTH < 24 HOURS Performed at [facility], [address] [place], [place] [address]    Report Status PENDING  Incomplete  Blood Culture (routine x 2)     Status: None (Preliminary result)   Collection Time: 06/13/19  5:32 PM   Specimen: BLOOD RIGHT ARM  Result Value Ref Range Status   Specimen Description BLOOD RIGHT ARM  Final   Special Requests   Final    BOTTLES DRAWN AEROBIC AND ANAEROBIC Blood Culture adequate volume   Culture   Final    NO GROWTH < 24 HOURS Performed at  [facility], [address] [place], [place] [address]    Report Status PENDING  Incomplete      Radiology Studies: Dg Chest Portable 1 View  Result Date: 06/13/2019 CLINICAL DATA:  Shortness of breath. COVID-19 positive. Fever and chills. EXAM: PORTABLE CHEST 1 VIEW COMPARISON:  06/02/2019 FINDINGS: Midline trachea. Normal heart size. No pleural effusion or pneumothorax. Development of pulmonary interstitial prominence with interstitial opacities bilaterally. No well-defined lobar consolidation. IMPRESSION: Development of relatively diffuse interstitial opacities which, given the clinical history are suspicious for atypical/viral pneumonia. In the appropriate clinical setting, mild pulmonary venous congestion could look similar. Electronically Signed   By: Abigail Miyamoto M.D.   On: 06/13/2019 18:10    Scheduled Meds: . clopidogrel  75 mg Oral Daily  . dexamethasone (DECADRON) injection  6 mg Intravenous Q24H  . dextromethorphan-guaiFENesin  1 tablet Oral BID  . dicyclomine  10 mg Oral TID AC &  HS  . enoxaparin (LOVENOX) injection  40 mg Subcutaneous Q24H  . fenofibrate  160 mg Oral Daily  . insulin aspart  0-15 Units Subcutaneous TID WC  . insulin aspart  0-5 Units Subcutaneous QHS  . Ipratropium-Albuterol  1 puff Inhalation Q6H  . LORazepam  1 mg Oral BID  . pantoprazole  40 mg Oral Daily  . rosuvastatin  40 mg Oral Daily  . vitamin C  500 mg Oral Daily  . zinc sulfate  220 mg Oral Daily   Continuous Infusions: . remdesivir 100 mg in NS 250 mL    . tocilizumab (ACTEMRA) IV       LOS: 1 day   Time spent: 35 minutes.  Patrecia Pour, MD [facility] [email or web address] Password [email or web address] 06/14/2019, 12:12 PM

## 2019-06-14 NOTE — Progress Notes (Signed)
Patient transferred in from [facility]: 63 yo F with COVID-19 PNA, new 3L O2 requirement.  1) Got dexamethasone 10mg  in ED, will put in for 6mg  IV daily. 2) start remdesivir per pharm consult 3) CRP 15.9, procalcitonin negative and WBC nl.  Does have H/O fatty liver however, will defer starting actemra for now.

## 2019-06-14 NOTE — Progress Notes (Signed)
Patient texted her husband to let him know she had arrived.  He did not call back.  She said he must be asleep.  This nurse asked her if she wanted her to call her husband and let him know she was here and she replied to just let him sleep they have had a long day.

## 2019-06-14 NOTE — ED Notes (Signed)
[facility] here to transport pt,  

## 2019-06-14 NOTE — Progress Notes (Signed)
Pharmacy Brief Note   O:  ALT: 18 CXR: Development of pulmonary interstitial prominence with interstitial opacities bilaterally.  SpO2: new 3L O2 requirement.   A/P:  Patient meets requirements for remdesivir therapy.  Will start  remdesivir 200 mg IV x 1  followed by 100 mg IV daily x 4 days.  Monitor ALT  Royetta Asal, PharmD, BCPS 06/14/2019 12:41 AM

## 2019-06-15 ENCOUNTER — Encounter: Payer: Medicare Other | Admitting: Family Medicine

## 2019-06-15 ENCOUNTER — Encounter: Payer: Self-pay | Admitting: Family Medicine

## 2019-06-15 LAB — COMPREHENSIVE METABOLIC PANEL
ALT: 18 U/L (ref 0–44)
AST: 16 U/L (ref 15–41)
Albumin: 2.8 g/dL — ABNORMAL LOW (ref 3.5–5.0)
Alkaline Phosphatase: 38 U/L (ref 38–126)
Anion gap: 8 (ref 5–15)
BUN: 16 mg/dL (ref 8–23)
CO2: 25 mmol/L (ref 22–32)
Calcium: 8.9 mg/dL (ref 8.9–10.3)
Chloride: 112 mmol/L — ABNORMAL HIGH (ref 98–111)
Creatinine, Ser: 0.64 mg/dL (ref 0.44–1.00)
GFR calc Af Amer: >60 mL/min (ref >60)
GFR calc non Af Amer: >60 mL/min (ref >60)
Glucose, Bld: 152 mg/dL — ABNORMAL HIGH (ref 70–99)
Potassium: 4 mmol/L (ref 3.5–5.1)
Sodium: 145 mmol/L (ref 135–145)
Total Bilirubin: 0.2 mg/dL — ABNORMAL LOW (ref 0.3–1.2)
Total Protein: 6.4 g/dL — ABNORMAL LOW (ref 6.5–8.1)

## 2019-06-15 LAB — CBC WITH DIFFERENTIAL/PLATELET
Abs Immature Granulocytes: 0.08 10*3/uL — ABNORMAL HIGH (ref 0.00–0.07)
Basophils Absolute: 0 10*3/uL (ref 0.0–0.1)
Basophils Relative: 0 %
Eosinophils Absolute: 0 10*3/uL (ref 0.0–0.5)
Eosinophils Relative: 0 %
HCT: 35.4 % — ABNORMAL LOW (ref 36.0–46.0)
Hemoglobin: 11.3 g/dL — ABNORMAL LOW (ref 12.0–15.0)
Immature Granulocytes: 1 %
Lymphocytes Relative: 9 %
Lymphs Abs: 1.2 10*3/uL (ref 0.7–4.0)
MCH: 29 pg (ref 26.0–34.0)
MCHC: 31.9 g/dL (ref 30.0–36.0)
MCV: 91 fL (ref 80.0–100.0)
Monocytes Absolute: 0.8 10*3/uL (ref 0.1–1.0)
Monocytes Relative: 6 %
Neutro Abs: 11.3 10*3/uL — ABNORMAL HIGH (ref 1.7–7.7)
Neutrophils Relative %: 84 %
Platelets: 222 10*3/uL (ref 150–400)
RBC: 3.89 MIL/uL (ref 3.87–5.11)
RDW: 14.3 % (ref 11.5–15.5)
WBC: 13.4 10*3/uL — ABNORMAL HIGH (ref 4.0–10.5)
nRBC: 0 % (ref 0.0–0.2)

## 2019-06-15 LAB — GLUCOSE, CAPILLARY
Glucose-Capillary: 137 mg/dL — ABNORMAL HIGH (ref 70–99)
Glucose-Capillary: 162 mg/dL — ABNORMAL HIGH (ref 70–99)
Glucose-Capillary: 191 mg/dL — ABNORMAL HIGH (ref 70–99)
Glucose-Capillary: 231 mg/dL — ABNORMAL HIGH (ref 70–99)

## 2019-06-15 LAB — FERRITIN: Ferritin: 314 ng/mL — ABNORMAL HIGH (ref 11–307)

## 2019-06-15 LAB — INTERLEUKIN-6, PLASMA

## 2019-06-15 LAB — C-REACTIVE PROTEIN: CRP: 10.3 mg/dL — ABNORMAL HIGH (ref <1.0)

## 2019-06-15 LAB — D-DIMER, QUANTITATIVE: D-Dimer, Quant: 0.91 ug/mL-FEU — ABNORMAL HIGH (ref 0.00–0.50)

## 2019-06-15 NOTE — Plan of Care (Signed)
  Problem: Education: Goal: Knowledge of risk factors and measures for prevention of condition will improve Outcome: Progressing   Problem: Coping: Goal: Psychosocial and spiritual needs will be supported Outcome: Progressing   Problem: Respiratory: Goal: Will maintain a patent airway Outcome: Progressing Goal: Complications related to the disease process, condition or treatment will be avoided or minimized Outcome: Progressing   Problem: Education: Goal: Knowledge of General Education information will improve Description: Including pain rating scale, medication(s)/side effects and non-pharmacologic comfort measures Outcome: Progressing   Problem: Health Behavior/Discharge Planning: Goal: Ability to manage health-related needs will improve Outcome: Progressing   Problem: Clinical Measurements: Goal: Ability to maintain clinical measurements within normal limits will improve Outcome: Progressing Goal: Will remain free from infection Outcome: Progressing Goal: Diagnostic test results will improve Outcome: Progressing Goal: Respiratory complications will improve Outcome: Progressing   Problem: Activity: Goal: Risk for activity intolerance will decrease Outcome: Progressing   Problem: Coping: Goal: Level of anxiety will decrease Outcome: Progressing   Problem: Pain Managment: Goal: General experience of comfort will improve Outcome: Progressing   Problem: Safety: Goal: Ability to remain free from injury will improve Outcome: Progressing   Problem: Skin Integrity: Goal: Risk for impaired skin integrity will decrease Outcome: Progressing   Problem: Clinical Measurements: Goal: Cardiovascular complication will be avoided Outcome: Completed/Met   Problem: Nutrition: Goal: Adequate nutrition will be maintained Outcome: Completed/Met   Problem: Elimination: Goal: Will not experience complications related to bowel motility Outcome: Completed/Met Goal: Will not  experience complications related to urinary retention Outcome: Completed/Met   

## 2019-06-15 NOTE — Progress Notes (Signed)
PT Cancellation Note  Patient Details Name: Brenda [name] MRN: [id number] DOB: May 18, 1956   Cancelled Treatment:    Reason Eval/Treat Not Completed: Other (comment)(Pt reports nausea and recently took meds for it.)   Shary Decamp [facility] 06/15/2019, 3:18 PM [place] Pager [phone] Office [phone]

## 2019-06-15 NOTE — Progress Notes (Deleted)
Virtual Visit via Video Note  I connected with@  on 06/15/19 at  1:00 PM EDT by a video enabled telemedicine application and verified that I am speaking with the correct person using two identifiers.  Location patient: home Location provider:work or home office Persons participating in the virtual visit: patient, provider  I discussed the limitations of evaluation and management by telemedicine and the availability of in person appointments. The patient expressed understanding and agreed to proceed.   HPI:    ROS: See pertinent positives and negatives per HPI.  Past Medical History:  Diagnosis Date  . Aortic valve stenosis    "very mild"  . Atypical chest pain 2007; 2015   cardiolyte neg, echo nl, cath showed mild/nonobstructive LAD disease  . Bilateral primary osteoarthritis of knee 06/30/2012   Right >L.  Right unicompartmental knee arthroplasty 10/25/18  . CAD (coronary artery disease)   . Chronic LBP    Multiple surgeries  . COPD (chronic obstructive pulmonary disease) ([place])   . COVID-19 virus infection 06/02/2019   Eval at [facility] ED and d/c'd home.  . DDD (degenerative disc disease)    spinal stenosis  . Fatty liver   . History of GI bleed    NSAIDS  . Hyperlipidemia    mixed  . Microscopic hematuria    full w/ u unrevealing X 2  . Normal nuclear stress test 11/11 and 06/2014   negative, EF normal  . Palpitations 2006   48H holter neg  . PONV (postoperative nausea and vomiting)    "never threw up but felt sick on my stomach", also believes that she was aware of surgery during her hysterectomy   . Pre-diabetes   . Prediabetes    Highest A1c 6.1% as of 03/2017  . RBBB (right bundle branch block)   . Recurrent UTI   . Tobacco dependence in remission    Quit fall 2015.  Only occasional bronchitis illness (CT shest 2009 nl- for f/u of ? nodule on CXR)    Past Surgical History:  Procedure Laterality Date  . ABDOMINAL HYSTERECTOMY  1997   DUB  . APPENDECTOMY  1984   . CARDIAC CATHETERIZATION  10/09/2005   no CAD, mildly elevated LVEDP, normal LV function (Dr. Gerrie Nordmann)  . CARDIAC CATHETERIZATION N/A 05/16/2015   Mild non-obstructive CAD--med mgmt.  Procedure: Left Heart Cath and Coronary Angiography;  Surgeon: Pixie Casino, MD;  Location: [facility];  Service: Cardiovascular;  Laterality: N/A;  . CARDIOVASCULAR STRESS TEST  06/2014   normal lexiscan NST  . CARDIOVASCULAR STRESS TEST  2006   persantine - no ischemia, low risk   . KNEE ARTHROSCOPY Bilateral   . left wrist ganglion cyst excision  2010  . [place]   right iliac crest bone graft+metal instrumentation; 2005 metal removal and decompression, 2011 lumbar decompression 4-11, then stabilization/ instrumentation done 09-19-10: L2,L3, L5 left and L2 , L3, L4 right pedical remnant L4 left embedded. Left iliac crest bone graft-- Dr Velna Ochs  . LUMBAR SPINE SURGERY  02/10/2016   Dr. Velna Ochs: lumbar decompression, instrumentation removal, and fusion exploration--HELPED her a lot, esp her radicular leg pains.  . OOPHORECTOMY Right    cyst  . PARTIAL KNEE ARTHROPLASTY Right 11/04/2018   Procedure: UNICOMPARTMENTAL KNEE;  Surgeon: Renette Butters, MD;  Location: [facility];  Service: Orthopedics;  Laterality: Right;  Adductor Block  . TONSILLECTOMY  63 yrs old  . TONSILLECTOMY    . TRANSTHORACIC ECHOCARDIOGRAM  2006   EF=>55%, trace MR, mild TR, trace AV regurg, trace pulm valve regurg     Family History  Problem Relation Age of Onset  . Heart Problems Mother        and thyroid problems  . Hyperlipidemia Mother   . Diabetes Mother   . Breast cancer Mother   . Heart failure Father        CHF, heart attack, diabetes, hyperlipidemia  . Heart disease Brother        back problems  . Hypertension Brother   . Kidney failure Maternal Grandmother   . Stroke Maternal Grandfather   . Cancer Paternal Grandmother        mouth - snuff  . Heart attack Paternal Grandfather         stroke, HTN  . Hyperlipidemia Brother   . Heart attack Brother   . Cancer Brother   . Coronary artery disease Neg Hx     SOCIAL HX: ***  No current facility-administered medications for this visit.  No current outpatient medications on file.  Facility-Administered Medications Ordered in Other Visits:  .  0.9 %  sodium chloride infusion, , Intravenous, PRN, Patrecia Pour, MD, Last Rate: 10 mL/hr at 06/15/19 0200 .  acetaminophen (TYLENOL) tablet 650 mg, 650 mg, Oral, Q6H PRN, Emokpae, Ejiroghene E, MD .  clopidogrel (PLAVIX) tablet 75 mg, 75 mg, Oral, Daily, Emokpae, Ejiroghene E, MD, 75 mg at 06/15/19 0909 .  cyclobenzaprine (FLEXERIL) tablet 10 mg, 10 mg, Oral, TID PRN, Etta Quill, DO, 10 mg at 06/14/19 0240 .  dexamethasone (DECADRON) injection 6 mg, 6 mg, Intravenous, Q24H, Gardner, Jared M, DO, 6 mg at 06/15/19 0908 .  dextromethorphan-guaiFENesin (MUCINEX DM) 30-600 MG per 12 hr tablet 1 tablet, 1 tablet, Oral, BID, Emokpae, Ejiroghene E, MD, 1 tablet at 06/15/19 0910 .  dicyclomine (BENTYL) capsule 10 mg, 10 mg, Oral, TID AC & HS, Emokpae, Ejiroghene E, MD, 10 mg at 06/15/19 0909 .  enoxaparin (LOVENOX) injection 40 mg, 40 mg, Subcutaneous, Q24H, Emokpae, Ejiroghene E, MD, 40 mg at 06/15/19 0908 .  fenofibrate tablet 160 mg, 160 mg, Oral, Daily, Emokpae, Ejiroghene E, MD, 160 mg at 06/15/19 0914 .  insulin aspart (novoLOG) injection 0-15 Units, 0-15 Units, Subcutaneous, TID WC, Patrecia Pour, MD, 2 Units at 06/15/19 0911 .  insulin aspart (novoLOG) injection 0-5 Units, 0-5 Units, Subcutaneous, QHS, Vance Gather B, MD .  Ipratropium-Albuterol (COMBIVENT) respimat 1 puff, 1 puff, Inhalation, Q6H, Emokpae, Ejiroghene E, MD, 1 puff at 06/15/19 0910 .  LORazepam (ATIVAN) tablet 1 mg, 1 mg, Oral, BID, Emokpae, Ejiroghene E, MD, 1 mg at 06/15/19 0909 .  ondansetron (ZOFRAN) tablet 4 mg, 4 mg, Oral, Q6H PRN **OR** ondansetron (ZOFRAN) injection 4 mg, 4 mg, Intravenous, Q6H PRN, Emokpae,  Ejiroghene E, MD, 4 mg at 06/15/19 0226 .  oxyCODONE (Oxy IR/ROXICODONE) immediate release tablet 5-10 mg, 5-10 mg, Oral, TID PRN, Etta Quill, DO, 5 mg at 06/15/19 0232 .  pantoprazole (PROTONIX) EC tablet 40 mg, 40 mg, Oral, Daily, Emokpae, Ejiroghene E, MD, 40 mg at 06/15/19 0909 .  polyethylene glycol (MIRALAX / GLYCOLAX) packet 17 g, 17 g, Oral, Daily PRN, Emokpae, Ejiroghene E, MD .  [COMPLETED] remdesivir 200 mg in sodium chloride 0.9 % 250 mL IVPB, 200 mg, Intravenous, Once, Last Rate: 500 mL/hr at 06/14/19 0248, 200 mg at 06/14/19 0248 **FOLLOWED BY** remdesivir 100 mg in sodium chloride 0.9 % 250 mL IVPB, 100 mg, Intravenous, Q24H,  Royetta Asal, [facility], Stopped at 06/14/19 2253 .  rosuvastatin (CRESTOR) tablet 40 mg, 40 mg, Oral, Daily, Emokpae, Ejiroghene E, MD, 40 mg at 06/15/19 0909 .  vitamin C (ASCORBIC ACID) tablet 500 mg, 500 mg, Oral, Daily, Emokpae, Ejiroghene E, MD, 500 mg at 06/15/19 0909 .  zinc sulfate capsule 220 mg, 220 mg, Oral, Daily, Emokpae, Ejiroghene E, MD, 220 mg at 06/15/19 0909  EXAM:  VITALS per patient if applicable:  GENERAL: alert, oriented, appears well and in no acute distress  HEENT: atraumatic, conjunttiva clear, no obvious abnormalities on inspection of external nose and ears  NECK: normal movements of the head and neck  LUNGS: on inspection no signs of respiratory distress, breathing rate appears normal, no obvious gross SOB, gasping or wheezing  CV: no obvious cyanosis  MS: moves all visible extremities without noticeable abnormality  PSYCH/NEURO: pleasant and cooperative, no obvious depression or anxiety, speech and thought processing grossly intact  ASSESSMENT AND PLAN:  Discussed the following assessment and plan:  No diagnosis found.     I discussed the assessment and treatment plan with the patient. The patient was provided an opportunity to ask questions and all were answered. The patient agreed with the plan and demonstrated  an understanding of the instructions.   The patient was advised to call back or seek an in-person evaluation if the symptoms worsen or if the condition fails to improve as anticipated.   Tammi Sou, MD

## 2019-06-15 NOTE — Progress Notes (Signed)
Occupational Therapy Evaluation Patient Details Name: Brenda [name] MRN: [id number] DOB: 10/02/1956 Today's Date: 06/15/2019    History of Present Illness 63 y.o. female with a history of CAD, COPD, and prediabetes who presented with several days of fever, chills, night sweats and a day of progressive shortness of breath. She had a positive covid exposure on 8/2, developed symptoms a couple days later. She was forced to take EMS to the ED by her husband who found her to be hypoxic at home. SpO2 was 84% on arrival, inflammatory markers elevated and CXR demonstrated diffuse bilateral opacities. Steroids were given and the patient admitted to [facility] where remdesivir was started and, after discussion of off-label nature, tocilizumab was administered.   Clinical Impression   PTA, pt independent with ADL and mobility and enjoys spending time with her grand children. Pt overall modified independent with mobility with SpO2 @ 93 during ADL task on 3L. Will follow acutely to educate on energy conservation and facilitate safe DC home.     Follow Up Recommendations  No OT follow up;Supervision - Intermittent    Equipment Recommendations  None recommended by OT    Recommendations for Other Services       Precautions / Restrictions Precautions Precautions: None      Mobility Bed Mobility Overal bed mobility: Independent                Transfers Overall transfer level: Modified independent                    Balance Overall balance assessment: No apparent balance deficits (not formally assessed)                                         ADL either performed or assessed with clinical judgement   ADL Overall ADL's : Needs assistance/impaired     Grooming: Supervision/safety   Upper Body Bathing: Set up;Sitting   Lower Body Bathing: Supervison/ safety;Set up;Sit to/from stand   Upper Body Dressing : Set up;Sitting   Lower Body Dressing:  Supervision/safety;Set up;Sit to/from stand   Toilet Transfer: Ambulation;Modified Independent   Toileting- Clothing Manipulation and Hygiene: Modified independent;Sit to/from stand       Functional mobility during ADLs: Modified independent General ADL Comments: Able to stand at sink and complete grooming tasks and UB bathing. SpO2 93 on 3L. 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      Pertinent Vitals/Pain Pain Assessment: No/denies pain     Hand Dominance     Extremity/Trunk Assessment Upper Extremity Assessment Upper Extremity Assessment: Generalized weakness   Lower Extremity Assessment Lower Extremity Assessment: Defer to PT evaluation   Cervical / Trunk Assessment Cervical / Trunk Assessment: Other exceptions(hx of back surgery)   Communication     Cognition Arousal/Alertness: Awake/alert Behavior During Therapy: WFL for tasks assessed/performed Overall Cognitive Status: Within Functional Limits for tasks assessed                                     General Comments       Exercises Exercises: Other exercises;General Upper Extremity General Exercises - Upper Extremity Shoulder Flexion: Strengthening;10 reps;Both;Theraband Theraband Level (Shoulder Flexion): Level 2 (Red) Shoulder ABduction: Strengthening;Both;10 reps;Seated;Theraband Theraband Level (Shoulder  Abduction): Level 2 (Red) Other Exercises Other Exercises: chair marches x 20   Shoulder Instructions      Home Living Family/patient expects to be discharged to:: Private residence Living Arrangements: Spouse/significant other Available Help at Discharge: Available 24 hours/day Type of Home: Mobile home Home Access: Stairs to enter [employer] of Steps: 3 Entrance Stairs-Rails: Right Home Layout: One level     Bathroom Shower/Tub: Occupational psychologist: Handicapped height Bathroom Accessibility: Yes How Accessible: Accessible via  walker Home Equipment: [place] - 2 wheels;Bedside commode;Shower seat;Cane - single point          Prior Functioning/Environment Level of Independence: Independent                 OT Problem List: Decreased activity tolerance;Cardiopulmonary status limiting activity      OT Treatment/Interventions: Self-care/ADL training;Therapeutic exercise;Energy conservation;DME and/or AE instruction;Therapeutic activities;Patient/family education    OT Goals(Current goals can be found in the care plan section) Acute Rehab OT Goals Patient Stated Goal: to go home OT Goal Formulation: With patient Time For Goal Achievement: 06/29/19 Potential to Achieve Goals: Good  OT Frequency: Min 3X/week   Barriers to D/C:            Co-evaluation              AM-PAC OT "6 Clicks" Daily Activity     Outcome Measure Help from another person eating meals?: None Help from another person taking care of personal grooming?: None Help from another person toileting, which includes using toliet, bedpan, or urinal?: A Little Help from another person bathing (including washing, rinsing, drying)?: A Little Help from another person to put on and taking off regular upper body clothing?: None Help from another person to put on and taking off regular lower body clothing?: A Little 6 Click Score: 21   End of Session Equipment Utilized During Treatment: Oxygen(3L) Nurse Communication: Mobility status  Activity Tolerance: Patient tolerated treatment well Patient left: in chair;with call bell/phone within reach  OT Visit Diagnosis: Muscle weakness (generalized) (M62.81)                Time: 8828-0034 OT Time Calculation (min): 25 min Charges:  OT General Charges $OT Visit: 1 Visit OT Evaluation $OT Eval Low Complexity: 1 Low OT Treatments $Self Care/Home Management : 8-22 mins  Maurie Boettcher, OT/L   Acute OT Clinical Specialist Acute Rehabilitation Services Pager [phone] Office  [phone]   [facility] 06/15/2019, 12:49 PM

## 2019-06-15 NOTE — Plan of Care (Signed)
  Problem: Education: Goal: Knowledge of risk factors and measures for prevention of condition will improve Outcome: Progressing   Problem: Coping: Goal: Psychosocial and spiritual needs will be supported Outcome: Progressing   Problem: Respiratory: Goal: Will maintain a patent airway Outcome: Progressing   Problem: Clinical Measurements: Goal: Respiratory complications will improve Outcome: Progressing   Problem: Activity: Goal: Risk for activity intolerance will decrease Outcome: Progressing   Problem: Coping: Goal: Level of anxiety will decrease Outcome: Progressing   Problem: Safety: Goal: Ability to remain free from injury will improve Outcome: Progressing

## 2019-06-15 NOTE — Progress Notes (Signed)
PROGRESS NOTE  STEPHANE JUNKINS  TTS:[id number] DOB: 02/03/1956 DOA: 06/13/2019 PCP: Tammi Sou, MD   Brief Narrative: THERSEA MANFREDONIA is a 63 y.o. female with a history of CAD, COPD, and prediabetes who presented with several days of fever, chills, night sweats and a day of progressive shortness of breath. She had a positive covid exposure on 8/2, developed symptoms a couple days later. She was forced to take EMS to the ED by her husband who found her to be hypoxic at home. SpO2 was 84% on arrival, inflammatory markers elevated and CXR demonstrated diffuse bilateral opacities. Steroids were given and the patient admitted to [facility] where remdesivir was started and, after discussion of off-label nature, tocilizumab was administered.  Assessment & Plan: Active Problems:   Pneumonia due to COVID-19 virus  Acute hypoxic respiratory failure due to covid-19 pneumonia: Felt to be at risk of progression to ARDS based on current assessment. - Continue IV steroids - Continue remdesivir (8/8 - 8/12) - Given tocilizumab 8/9. Inflammatory markers trending downward.  - Continue airborne, contact precautions. - Check daily labs: CBC w/diff, CMP, d-dimer, ferritin, CRP - Maintain euvolemia/net negative.  - Avoid NSAIDs - Recommend proning and aggressive use of incentive spirometry.  Prediabetes: HbA1c 6.4%.  - SSI, anticipate steroid-induced hyperglycemia  Anxiety:  - Continue home medication  CAD: No chest pain. Mild nonobstructive disease on LHC 2016.  - Continue plavix.   COPD:  - Continue albuterol, steroids as above. No wheezing on exam.   DVT prophylaxis: Lovenox Code Status: Full Family Communication: Husband by phone Disposition Plan: Uncertain, will get PT/OT  Consultants:   None  Procedures:   None  Antimicrobials:  Remdesivir 8/8 - 8/12   Subjective: Feels slightly better, less short of breath today. Sleeping well. Denies chest pain. No fevers.  Objective: Vitals:    06/14/19 0427 06/14/19 2000 06/15/19 0410 06/15/19 0812  BP: 102/64 (!) 115/54 (!) 114/56   Pulse:      Resp:      Temp: 98.9 F (37.2 C)   (!) 97.5 F (36.4 C)  TempSrc: Oral Oral Oral Oral  SpO2:      Weight:      Height:        Intake/Output Summary (Last 24 hours) at 06/15/2019 1119 Last data filed at 06/15/2019 0200 Gross per 24 hour  Intake 510.57 ml  Output -  Net 510.57 ml   Filed Weights   06/13/19 1702  Weight: 81.6 kg   Gen: 63 y.o. female in no distress Pulm: Nonlabored breathing supplemental oxygen. Rhonchi stable, no crackles or wheezes, diminished. CV: Regular rate and rhythm. No murmur, rub, or gallop. No JVD, no dependent edema. GI: Abdomen soft, non-tender, non-distended, with normoactive bowel sounds.  Ext: Warm, no deformities Skin: No rashes, lesions or ulcers on visualized skin. Neuro: Alert and oriented. No focal neurological deficits. Psych: Judgement and insight appear fair. Mood euthymic & affect congruent. Behavior is appropriate.     Data Reviewed: I have personally reviewed following labs and imaging studies  CBC: Recent Labs  Lab 06/13/19 1726 06/14/19 0215 06/15/19 0240  WBC 8.4 4.4 13.4*  NEUTROABS 5.8 3.8 11.3*  HGB 12.2 11.7* 11.3*  HCT 38.2 36.7 35.4*  MCV 90.5 89.7 91.0  PLT 137* 147* 092   Basic Metabolic Panel: Recent Labs  Lab 06/13/19 1726 06/14/19 0215 06/15/19 0240  NA 138 142 145  K 3.6 3.7 4.0  CL 104 107 112*  CO2 23 26 25  GLUCOSE 113* 215* 152*  BUN 7* 9 16  CREATININE 0.62 0.48 0.64  CALCIUM 8.5* 8.7* 8.9   GFR: Estimated Creatinine Clearance: 74.4 mL/min (by C-G formula based on SCr of 0.64 mg/dL). Liver Function Tests: Recent Labs  Lab 06/13/19 1726 06/15/19 0240  AST 18 16  ALT 19 18  ALKPHOS 43 38  BILITOT 0.9 0.2*  PROT 6.6 6.4*  ALBUMIN 3.0* 2.8*   No results for input(s): LIPASE, AMYLASE in the last 168 hours. No results for input(s): AMMONIA in the last 168 hours. Coagulation  Profile: No results for input(s): INR, PROTIME in the last 168 hours. Cardiac Enzymes: No results for input(s): CKTOTAL, CKMB, CKMBINDEX, TROPONINI in the last 168 hours. BNP (last 3 results) No results for input(s): PROBNP in the last 8760 hours. HbA1C: Recent Labs    06/14/19 0500  HGBA1C 6.4*   CBG: Recent Labs  Lab 06/14/19 0810 06/14/19 1216 06/14/19 1647 06/14/19 2059 06/15/19 0810  GLUCAP 190* 146* 160* 147* 137*   Lipid Profile: Recent Labs    06/13/19 1727  TRIG 84   Thyroid Function Tests: No results for input(s): TSH, T4TOTAL, FREET4, T3FREE, THYROIDAB in the last 72 hours. Anemia Panel: Recent Labs    06/14/19 0215 06/15/19 0240  FERRITIN 288 314*   Urine analysis:    Component Value Date/Time   COLORURINE YELLOW 11/26/2018 [place] 11/26/2018 0852   LABSPEC 1.009 11/26/2018 0852   PHURINE 6.0 11/26/2018 0852   GLUCOSEU NEGATIVE 11/26/2018 0852   HGBUR SMALL (A) 11/26/2018 0852   BILIRUBINUR NEGATIVE 11/26/2018 0852   BILIRUBINUR negative 08/21/2018 1031   KETONESUR NEGATIVE 11/26/2018 0852   PROTEINUR NEGATIVE 11/26/2018 0852   UROBILINOGEN 0.2 08/21/2018 1031   UROBILINOGEN 0.2 08/21/2013 2259   NITRITE NEGATIVE 11/26/2018 0852   LEUKOCYTESUR NEGATIVE 11/26/2018 0852   Recent Results (from the past 240 hour(s))  Blood Culture (routine x 2)     Status: None (Preliminary result)   Collection Time: 06/13/19  5:27 PM   Specimen: BLOOD LEFT ARM  Result Value Ref Range Status   Specimen Description BLOOD LEFT ARM  Final   Special Requests   Final    BOTTLES DRAWN AEROBIC AND ANAEROBIC Blood Culture adequate volume   Culture   Final    NO GROWTH 2 DAYS Performed at [facility], [address] [place], [place] [address]    Report Status PENDING  Incomplete  Blood Culture (routine x 2)     Status: None (Preliminary result)   Collection Time: 06/13/19  5:32 PM   Specimen: BLOOD RIGHT ARM  Result Value Ref Range Status    Specimen Description BLOOD RIGHT ARM  Final   Special Requests   Final    BOTTLES DRAWN AEROBIC AND ANAEROBIC Blood Culture adequate volume   Culture   Final    NO GROWTH 2 DAYS Performed at [facility], [address], [place],  [place] [address]    Report Status PENDING  Incomplete      Radiology Studies: Dg Chest Portable 1 View  Result Date: 06/13/2019 CLINICAL DATA:  Shortness of breath. COVID-19 positive. Fever and chills. EXAM: PORTABLE CHEST 1 VIEW COMPARISON:  06/02/2019 FINDINGS: Midline trachea. Normal heart size. No pleural effusion or pneumothorax. Development of pulmonary interstitial prominence with interstitial opacities bilaterally. No well-defined lobar consolidation. IMPRESSION: Development of relatively diffuse interstitial opacities which, given the clinical history are suspicious for atypical/viral pneumonia. In the appropriate clinical setting, mild pulmonary venous congestion could look  similar. Electronically Signed   By: Abigail Miyamoto M.D.   On: 06/13/2019 18:10    Scheduled Meds: . clopidogrel  75 mg Oral Daily  . dexamethasone (DECADRON) injection  6 mg Intravenous Q24H  . dextromethorphan-guaiFENesin  1 tablet Oral BID  . dicyclomine  10 mg Oral TID AC & HS  . enoxaparin (LOVENOX) injection  40 mg Subcutaneous Q24H  . fenofibrate  160 mg Oral Daily  . insulin aspart  0-15 Units Subcutaneous TID WC  . insulin aspart  0-5 Units Subcutaneous QHS  . Ipratropium-Albuterol  1 puff Inhalation Q6H  . LORazepam  1 mg Oral BID  . pantoprazole  40 mg Oral Daily  . rosuvastatin  40 mg Oral Daily  . vitamin C  500 mg Oral Daily  . zinc sulfate  220 mg Oral Daily   Continuous Infusions: . sodium chloride 10 mL/hr at 06/15/19 0200  . remdesivir 100 mg in NS 250 mL Stopped (06/14/19 2253)     LOS: 2 days   Time spent: 25 minutes.  Patrecia Pour, MD [facility] [email or web address] Password [email or web address] 06/15/2019, 11:19 AM

## 2019-06-16 LAB — CBC WITH DIFFERENTIAL/PLATELET
Abs Immature Granulocytes: 0.08 10*3/uL — ABNORMAL HIGH (ref 0.00–0.07)
Basophils Absolute: 0 10*3/uL (ref 0.0–0.1)
Basophils Relative: 0 %
Eosinophils Absolute: 0 10*3/uL (ref 0.0–0.5)
Eosinophils Relative: 0 %
HCT: 36.6 % (ref 36.0–46.0)
Hemoglobin: 11.4 g/dL — ABNORMAL LOW (ref 12.0–15.0)
Immature Granulocytes: 1 %
Lymphocytes Relative: 11 %
Lymphs Abs: 1.5 10*3/uL (ref 0.7–4.0)
MCH: 28.5 pg (ref 26.0–34.0)
MCHC: 31.1 g/dL (ref 30.0–36.0)
MCV: 91.5 fL (ref 80.0–100.0)
Monocytes Absolute: 0.6 10*3/uL (ref 0.1–1.0)
Monocytes Relative: 4 %
Neutro Abs: 12 10*3/uL — ABNORMAL HIGH (ref 1.7–7.7)
Neutrophils Relative %: 84 %
Platelets: 273 10*3/uL (ref 150–400)
RBC: 4 MIL/uL (ref 3.87–5.11)
RDW: 14.2 % (ref 11.5–15.5)
WBC: 14.1 10*3/uL — ABNORMAL HIGH (ref 4.0–10.5)
nRBC: 0 % (ref 0.0–0.2)

## 2019-06-16 LAB — GLUCOSE, CAPILLARY
Glucose-Capillary: 138 mg/dL — ABNORMAL HIGH (ref 70–99)
Glucose-Capillary: 174 mg/dL — ABNORMAL HIGH (ref 70–99)
Glucose-Capillary: 231 mg/dL — ABNORMAL HIGH (ref 70–99)
Glucose-Capillary: 165 mg/dL — ABNORMAL HIGH (ref 70–99)

## 2019-06-16 LAB — URINALYSIS, ROUTINE W REFLEX MICROSCOPIC
Bilirubin Urine: NEGATIVE
Glucose, UA: NEGATIVE mg/dL
Hgb urine dipstick: NEGATIVE
Ketones, ur: NEGATIVE mg/dL
Nitrite: NEGATIVE
Protein, ur: NEGATIVE mg/dL
Specific Gravity, Urine: 1.008 (ref 1.005–1.030)
pH: 6 (ref 5.0–8.0)

## 2019-06-16 LAB — COMPREHENSIVE METABOLIC PANEL
ALT: 18 U/L (ref 0–44)
AST: 13 U/L — ABNORMAL LOW (ref 15–41)
Albumin: 2.7 g/dL — ABNORMAL LOW (ref 3.5–5.0)
Alkaline Phosphatase: 43 U/L (ref 38–126)
Anion gap: 13 (ref 5–15)
BUN: 16 mg/dL (ref 8–23)
CO2: 25 mmol/L (ref 22–32)
Calcium: 8.8 mg/dL — ABNORMAL LOW (ref 8.9–10.3)
Chloride: 105 mmol/L (ref 98–111)
Creatinine, Ser: 0.51 mg/dL (ref 0.44–1.00)
GFR calc Af Amer: >60 mL/min (ref >60)
GFR calc non Af Amer: >60 mL/min (ref >60)
Glucose, Bld: 144 mg/dL — ABNORMAL HIGH (ref 70–99)
Potassium: 4.1 mmol/L (ref 3.5–5.1)
Sodium: 143 mmol/L (ref 135–145)
Total Bilirubin: 0.5 mg/dL (ref 0.3–1.2)
Total Protein: 6.2 g/dL — ABNORMAL LOW (ref 6.5–8.1)

## 2019-06-16 LAB — FERRITIN: Ferritin: 347 ng/mL — ABNORMAL HIGH (ref 11–307)

## 2019-06-16 LAB — D-DIMER, QUANTITATIVE: D-Dimer, Quant: 0.55 ug/mL-FEU — ABNORMAL HIGH (ref 0.00–0.50)

## 2019-06-16 LAB — C-REACTIVE PROTEIN: CRP: 4.8 mg/dL — ABNORMAL HIGH (ref <1.0)

## 2019-06-16 NOTE — Progress Notes (Signed)
PROGRESS NOTE  LILLA CALLEJO  NUU:[id number] DOB: 07-Jul-1956 DOA: 06/13/2019 PCP: Tammi Sou, MD   Brief Narrative: RAYSHAWN VISCONTI is a 63 y.o. female with a history of CAD, COPD, and prediabetes who presented with several days of fever, chills, night sweats and a day of progressive shortness of breath. She had a positive covid exposure on 8/2, developed symptoms a couple days later. She was forced to take EMS to the ED by her husband who found her to be hypoxic at home. SpO2 was 84% on arrival, inflammatory markers elevated and CXR demonstrated diffuse bilateral opacities. Steroids were given and the patient admitted to [facility] where remdesivir was started and, after discussion of off-label nature, tocilizumab was administered.  Assessment & Plan: Active Problems:   Pneumonia due to COVID-19 virus  Acute hypoxic respiratory failure due to covid-19 pneumonia: Felt to be at risk of progression to ARDS based on current assessment. - Continue IV steroids, continue decadron 6mg  as she is showing clinical improvement. - Continue remdesivir (8/8 - 8/12) - Given tocilizumab 8/9. CRP, d-dimer continue trending downward, ferritin elevated.  - Continue airborne, contact precautions. - Check daily labs: CBC w/diff, CMP, d-dimer, ferritin, CRP - Maintain euvolemia/net negative.  - Avoid NSAIDs - Recommend proning and aggressive use of incentive spirometry.  Prediabetes: HbA1c 6.4%.  - SSI, anticipate steroid-induced hyperglycemia, at inpatient goal currently.  Anxiety:  - Continue home medication  CAD: No chest pain. Mild nonobstructive disease on LHC 2016.  - Continue plavix.  - DC telemetry to promote mobility. No significant/new arrhythmias on telemetry.  COPD:  - Continue albuterol, steroids as above. No wheezing on exam.   DVT prophylaxis: Lovenox Code Status: Full Family Communication: Husband by phone again today. Disposition Plan: Uncertain, will get PT/OT. Possibly DC home  8/13 after completing remdesivir late on 8/12.  Consultants:   None  Procedures:   None  Antimicrobials:  Remdesivir 8/8 - 8/12   Subjective: No new complaints. Feels shortness of breath is improving steadily. Got up with therapy yesterday, required 3L O2. Getting up mostly to [facility], not all the way to [place].    Objective: Vitals:   06/15/19 1640 06/15/19 2024 06/16/19 0444 06/16/19 0729  BP:  (!) 116/58 (!) 104/52 121/61  Pulse:  68 64 (!) 52  Resp: (!) 24 18 18 18   Temp: 98.4 F (36.9 C) 98.3 F (36.8 C) 97.8 F (36.6 C) 98 F (36.7 C)  TempSrc: Oral Oral Oral Oral  SpO2:  91% 94% 93%  Weight:      Height:        Intake/Output Summary (Last 24 hours) at 06/16/2019 0910 Last data filed at 06/15/2019 1700 Gross per 24 hour  Intake 220 ml  Output -  Net 220 ml   Filed Weights   06/13/19 1702  Weight: 81.6 kg   Gen: 63 y.o. female in no distress Pulm: Nonlabored breathing supplemental oxygen. Good aeration with scant bibasilar crackles. CV: Regular rate and rhythm. No murmur, rub, or gallop. No JVD, no dependent edema. GI: Abdomen soft, non-tender, non-distended, with normoactive bowel sounds.  Ext: Warm, no deformities Skin: No rashes, lesions or ulcers on visualized skin. Neuro: Alert and oriented. No focal neurological deficits. Psych: Judgement and insight appear fair. Mood euthymic & affect congruent. Behavior is appropriate.    Data Reviewed: I have personally reviewed following labs and imaging studies  CBC: Recent Labs  Lab 06/13/19 1726 06/14/19 0215 06/15/19 0240 06/16/19 0255  WBC 8.4 4.4  13.4* 14.1*  NEUTROABS 5.8 3.8 11.3* 12.0*  HGB 12.2 11.7* 11.3* 11.4*  HCT 38.2 36.7 35.4* 36.6  MCV 90.5 89.7 91.0 91.5  PLT 137* 147* 222 315   Basic Metabolic Panel: Recent Labs  Lab 06/13/19 1726 06/14/19 0215 06/15/19 0240 06/16/19 0255  NA 138 142 145 143  K 3.6 3.7 4.0 4.1  CL 104 107 112* 105  CO2 23 26 25 25   GLUCOSE 113* 215* 152* 144*   BUN 7* 9 16 16   CREATININE 0.62 0.48 0.64 0.51  CALCIUM 8.5* 8.7* 8.9 8.8*   GFR: Estimated Creatinine Clearance: 74.4 mL/min (by C-G formula based on SCr of 0.51 mg/dL). Liver Function Tests: Recent Labs  Lab 06/13/19 1726 06/15/19 0240 06/16/19 0255  AST 18 16 13*  ALT 19 18 18   ALKPHOS 43 38 43  BILITOT 0.9 0.2* 0.5  PROT 6.6 6.4* 6.2*  ALBUMIN 3.0* 2.8* 2.7*   No results for input(s): LIPASE, AMYLASE in the last 168 hours. No results for input(s): AMMONIA in the last 168 hours. Coagulation Profile: No results for input(s): INR, PROTIME in the last 168 hours. Cardiac Enzymes: No results for input(s): CKTOTAL, CKMB, CKMBINDEX, TROPONINI in the last 168 hours. BNP (last 3 results) No results for input(s): PROBNP in the last 8760 hours. HbA1C: Recent Labs    06/14/19 0500  HGBA1C 6.4*   CBG: Recent Labs  Lab 06/15/19 0810 06/15/19 1154 06/15/19 1637 06/15/19 2023 06/16/19 0823  GLUCAP 137* 162* 191* 231* 138*   Lipid Profile: Recent Labs    06/13/19 1727  TRIG 84   Thyroid Function Tests: No results for input(s): TSH, T4TOTAL, FREET4, T3FREE, THYROIDAB in the last 72 hours. Anemia Panel: Recent Labs    06/15/19 0240 06/16/19 0255  FERRITIN 314* 347*   Urine analysis:    Component Value Date/Time   COLORURINE YELLOW 11/26/2018 [place] 11/26/2018 0852   LABSPEC 1.009 11/26/2018 0852   PHURINE 6.0 11/26/2018 0852   GLUCOSEU NEGATIVE 11/26/2018 0852   HGBUR SMALL (A) 11/26/2018 0852   BILIRUBINUR NEGATIVE 11/26/2018 0852   BILIRUBINUR negative 08/21/2018 1031   KETONESUR NEGATIVE 11/26/2018 0852   PROTEINUR NEGATIVE 11/26/2018 0852   UROBILINOGEN 0.2 08/21/2018 1031   UROBILINOGEN 0.2 08/21/2013 2259   NITRITE NEGATIVE 11/26/2018 0852   LEUKOCYTESUR NEGATIVE 11/26/2018 0852   Recent Results (from the past 240 hour(s))  Blood Culture (routine x 2)     Status: None (Preliminary result)   Collection Time: 06/13/19  5:27 PM    Specimen: BLOOD LEFT ARM  Result Value Ref Range Status   Specimen Description BLOOD LEFT ARM  Final   Special Requests   Final    BOTTLES DRAWN AEROBIC AND ANAEROBIC Blood Culture adequate volume   Culture   Final    NO GROWTH 3 DAYS Performed at [facility], [address] [place], [place] [address]    Report Status PENDING  Incomplete  Blood Culture (routine x 2)     Status: None (Preliminary result)   Collection Time: 06/13/19  5:32 PM   Specimen: BLOOD RIGHT ARM  Result Value Ref Range Status   Specimen Description BLOOD RIGHT ARM  Final   Special Requests   Final    BOTTLES DRAWN AEROBIC AND ANAEROBIC Blood Culture adequate volume   Culture   Final    NO GROWTH 3 DAYS Performed at [facility], [address] [place], [place] [address]    Report Status PENDING  Incomplete  Radiology Studies: No results found.  Scheduled Meds: . clopidogrel  75 mg Oral Daily  . dexamethasone (DECADRON) injection  6 mg Intravenous Q24H  . dextromethorphan-guaiFENesin  1 tablet Oral BID  . dicyclomine  10 mg Oral TID AC & HS  . enoxaparin (LOVENOX) injection  40 mg Subcutaneous Q24H  . fenofibrate  160 mg Oral Daily  . insulin aspart  0-15 Units Subcutaneous TID WC  . insulin aspart  0-5 Units Subcutaneous QHS  . Ipratropium-Albuterol  1 puff Inhalation Q6H  . LORazepam  1 mg Oral BID  . pantoprazole  40 mg Oral Daily  . rosuvastatin  40 mg Oral Daily  . vitamin C  500 mg Oral Daily  . zinc sulfate  220 mg Oral Daily   Continuous Infusions: . sodium chloride 10 mL/hr at 06/15/19 0200  . remdesivir 100 mg in NS 250 mL Stopped (06/15/19 2230)     LOS: 3 days   Time spent: 25 minutes.  Patrecia Pour, MD [facility] [email or web address] Password [email or web address] 06/16/2019, 9:10 AM

## 2019-06-16 NOTE — Evaluation (Signed)
Physical Therapy Evaluation Patient Details Name: Brenda [name] MRN: [id number] DOB: 08-30-1956 Today's Date: 06/16/2019   History of Present Illness  63 y.o. female with a history of CAD, COPD, and prediabetes who presented with several days of fever, chills, night sweats and a day of progressive shortness of breath. She had a positive covid exposure on 8/2, developed symptoms a couple days later. She was forced to take EMS to the ED by her husband who found her to be hypoxic at home. SpO2 was 84% on arrival, inflammatory markers elevated and CXR demonstrated diffuse bilateral opacities. Steroids were given and the patient admitted to [facility] where remdesivir was started and, after discussion of off-label nature, tocilizumab was administered.  Clinical Impression  Pt presents to PT with decr activity tolerance due to effects of Covid. Expect pt will make steady progress and be able to return home. Will follow acutely but don't feel will need PT at time of discharge.      Follow Up Recommendations No PT follow up    Equipment Recommendations  None recommended by PT    Recommendations for Other Services       Precautions / Restrictions Precautions Precautions: None Restrictions Weight Bearing Restrictions: No      Mobility  Bed Mobility               General bed mobility comments: Pt up in bathroom  Transfers Overall transfer level: Modified independent Equipment used: None                Ambulation/Gait Ambulation/Gait assistance: Supervision Gait Distance (Feet): 1000 Feet Assistive device: None Gait Pattern/deviations: Step-through pattern;Decreased stride length   Gait velocity interpretation: >4.37 ft/sec, indicative of normal walking speed General Gait Details: supervision to manage and monitor oxygen. Amb on 1L with SpO2 87-90%.   Stairs Stairs: Yes Stairs assistance: Min guard Stair Management: Step to pattern;Forwards Number of Stairs: 2 General  stair comments: Used hand held to simulate rails  Wheelchair Mobility    Modified Rankin (Stroke Patients Only)       Balance Overall balance assessment: No apparent balance deficits (not formally assessed)                                           Pertinent Vitals/Pain Pain Assessment: No/denies pain    Home Living Family/patient expects to be discharged to:: Private residence Living Arrangements: Spouse/significant other Available Help at Discharge: Available 24 hours/day Type of Home: Mobile home Home Access: Stairs to enter Entrance Stairs-Rails: Right Entrance Stairs-Number of Steps: 3 Home Layout: One level Home Equipment: Walker - 2 wheels;Bedside commode;Shower seat;Cane - single point      Prior Function Level of Independence: Independent               Hand Dominance   Dominant Hand: Right    Extremity/Trunk Assessment   Upper Extremity Assessment Upper Extremity Assessment: Defer to OT evaluation    Lower Extremity Assessment Lower Extremity Assessment: Generalized weakness    Cervical / Trunk Assessment Cervical / Trunk Assessment: Other exceptions(hx of back surgery)  Communication   Communication: No difficulties  Cognition Arousal/Alertness: Awake/alert Behavior During Therapy: WFL for tasks assessed/performed Overall Cognitive Status: Within Functional Limits for tasks assessed  General Comments      Exercises     Assessment/Plan    PT Assessment Patient needs continued PT services  PT Problem List Decreased strength;Cardiopulmonary status limiting activity       PT Treatment Interventions Gait training;Functional mobility training;Therapeutic activities;Therapeutic exercise;Patient/family education    PT Goals (Current goals can be found in the Care Plan section)  Acute Rehab PT Goals Patient Stated Goal: to go home PT Goal Formulation: With  patient Time For Goal Achievement: 06/23/19 Potential to Achieve Goals: Good    Frequency Min 3X/week   Barriers to discharge        Co-evaluation               AM-PAC PT "6 Clicks" Mobility  Outcome Measure Help needed turning from your back to your side while in a flat bed without using bedrails?: None Help needed moving from lying on your back to sitting on the side of a flat bed without using bedrails?: None Help needed moving to and from a bed to a chair (including a wheelchair)?: None Help needed standing up from a chair using your arms (e.g., wheelchair or bedside chair)?: None Help needed to walk in hospital room?: None Help needed climbing 3-5 steps with a railing? : Total 6 Click Score: 21    End of Session Equipment Utilized During Treatment: Oxygen Activity Tolerance: Patient tolerated treatment well Patient left: Other (comment)(in bathroom)   PT Visit Diagnosis: Other abnormalities of gait and mobility (R26.89)    Time: 1438-8875 PT Time Calculation (min) (ACUTE ONLY): 22 min   Charges:   PT Evaluation $PT Eval Low Complexity: [place] Pager [phone] Office [place] 06/16/2019, 9:39 AM

## 2019-06-16 NOTE — Plan of Care (Signed)
  Problem: Education: Goal: Knowledge of risk factors and measures for prevention of condition will improve Outcome: Progressing   Problem: Coping: Goal: Psychosocial and spiritual needs will be supported Outcome: Progressing   Problem: Respiratory: Goal: Will maintain a patent airway Outcome: Progressing Goal: Complications related to the disease process, condition or treatment will be avoided or minimized Outcome: Progressing   Problem: Education: Goal: Knowledge of General Education information will improve Description: Including pain rating scale, medication(s)/side effects and non-pharmacologic comfort measures Outcome: Progressing   Problem: Health Behavior/Discharge Planning: Goal: Ability to manage health-related needs will improve Outcome: Progressing   Problem: Clinical Measurements: Goal: Ability to maintain clinical measurements within normal limits will improve Outcome: Progressing Goal: Will remain free from infection Outcome: Progressing Goal: Diagnostic test results will improve Outcome: Progressing Goal: Respiratory complications will improve Outcome: Progressing   Problem: Activity: Goal: Risk for activity intolerance will decrease Outcome: Progressing   Problem: Coping: Goal: Level of anxiety will decrease Outcome: Progressing   Problem: Pain Managment: Goal: General experience of comfort will improve Outcome: Progressing   Problem: Safety: Goal: Ability to remain free from injury will improve Outcome: Progressing   Problem: Skin Integrity: Goal: Risk for impaired skin integrity will decrease Outcome: Progressing

## 2019-06-17 DIAGNOSIS — J988 Other specified respiratory disorders: Secondary | ICD-10-CM

## 2019-06-17 DIAGNOSIS — R0902 Hypoxemia: Secondary | ICD-10-CM

## 2019-06-17 LAB — GLUCOSE, CAPILLARY
Glucose-Capillary: 82 mg/dL (ref 70–99)
Glucose-Capillary: 132 mg/dL — ABNORMAL HIGH (ref 70–99)
Glucose-Capillary: 182 mg/dL — ABNORMAL HIGH (ref 70–99)
Glucose-Capillary: 192 mg/dL — ABNORMAL HIGH (ref 70–99)

## 2019-06-17 LAB — COMPREHENSIVE METABOLIC PANEL
ALT: 22 U/L (ref 0–44)
AST: 17 U/L (ref 15–41)
Albumin: 3 g/dL — ABNORMAL LOW (ref 3.5–5.0)
Alkaline Phosphatase: 46 U/L (ref 38–126)
Anion gap: 9 (ref 5–15)
BUN: 20 mg/dL (ref 8–23)
CO2: 29 mmol/L (ref 22–32)
Calcium: 8.7 mg/dL — ABNORMAL LOW (ref 8.9–10.3)
Chloride: 105 mmol/L (ref 98–111)
Creatinine, Ser: 0.68 mg/dL (ref 0.44–1.00)
GFR calc Af Amer: >60 mL/min (ref >60)
GFR calc non Af Amer: >60 mL/min (ref >60)
Glucose, Bld: 95 mg/dL (ref 70–99)
Potassium: 4.1 mmol/L (ref 3.5–5.1)
Sodium: 143 mmol/L (ref 135–145)
Total Bilirubin: 0.4 mg/dL (ref 0.3–1.2)
Total Protein: 6.5 g/dL (ref 6.5–8.1)

## 2019-06-17 LAB — CBC WITH DIFFERENTIAL/PLATELET
Abs Immature Granulocytes: 0.14 10*3/uL — ABNORMAL HIGH (ref 0.00–0.07)
Basophils Absolute: 0 10*3/uL (ref 0.0–0.1)
Basophils Relative: 0 %
Eosinophils Absolute: 0 10*3/uL (ref 0.0–0.5)
Eosinophils Relative: 0 %
HCT: 38.4 % (ref 36.0–46.0)
Hemoglobin: 11.9 g/dL — ABNORMAL LOW (ref 12.0–15.0)
Immature Granulocytes: 1 %
Lymphocytes Relative: 21 %
Lymphs Abs: 2.8 10*3/uL (ref 0.7–4.0)
MCH: 28.3 pg (ref 26.0–34.0)
MCHC: 31 g/dL (ref 30.0–36.0)
MCV: 91.2 fL (ref 80.0–100.0)
Monocytes Absolute: 0.9 10*3/uL (ref 0.1–1.0)
Monocytes Relative: 7 %
Neutro Abs: 9.1 10*3/uL — ABNORMAL HIGH (ref 1.7–7.7)
Neutrophils Relative %: 71 %
Platelets: 303 10*3/uL (ref 150–400)
RBC: 4.21 MIL/uL (ref 3.87–5.11)
RDW: 14 % (ref 11.5–15.5)
WBC: 13 10*3/uL — ABNORMAL HIGH (ref 4.0–10.5)
nRBC: 0 % (ref 0.0–0.2)

## 2019-06-17 LAB — D-DIMER, QUANTITATIVE: D-Dimer, Quant: 0.59 ug/mL-FEU — ABNORMAL HIGH (ref 0.00–0.50)

## 2019-06-17 LAB — C-REACTIVE PROTEIN: CRP: 2.4 mg/dL — ABNORMAL HIGH (ref <1.0)

## 2019-06-17 LAB — FERRITIN: Ferritin: 368 ng/mL — ABNORMAL HIGH (ref 11–307)

## 2019-06-17 NOTE — Progress Notes (Signed)
OT Cancellation Note  Patient Details Name: Brenda [name] MRN: [id number] DOB: [birth year]   Cancelled Treatment:    Reason Eval/Treat Not Completed: Other (comment)(Upon arrival, pt tearful and reporting that her husband is at [facility] due to [place]. Pt becoming more tearful and requesting to be left alone. Providing pt with ginger ale on ice per request and leaving EC handout in room. Will return as schedule allows. Thank you.)  [place], OTR/L Acute Rehab Pager: [phone] Office: [phone] 06/17/2019, 2:55 PM

## 2019-06-17 NOTE — Progress Notes (Signed)
PROGRESS NOTE  Brenda [name]  WUJ:[id number] DOB: Jul 02, 1956 DOA: 06/13/2019 PCP: Tammi Sou, MD   Brief Narrative: Brenda [name] is a 63 y.o. female with a history of CAD, COPD, and prediabetes who presented with several days of fever, chills, night sweats and a day of progressive shortness of breath. She had a positive covid exposure on 8/2, developed symptoms a couple days later. She was forced to take EMS to the ED by her husband who found her to be hypoxic at home. SpO2 was 84% on arrival, inflammatory markers elevated and CXR demonstrated diffuse bilateral opacities. Steroids were given and the patient admitted to [facility] where remdesivir was started and, after discussion of off-label nature, tocilizumab was administered.  Subjective: No new complaints. Feels shortness of breath is improving steadily.  She is on room air this morning.  Assessment & Plan: Active Problems:   Pneumonia due to COVID-19 virus  Acute hypoxic respiratory failure due to covid-19 pneumonia: Felt to be at risk of progression to ARDS based on current assessment. - Continue IV steroids, continue decadron 6mg  as she is showing clinical improvement. - Continue remdesivir (8/9- 8/13) - Given tocilizumab 8/9. CRP, d-dimer continue trending downward, ferritin elevated.  - Continue airborne, contact precautions. - Check daily labs: CBC w/diff, CMP, d-dimer, ferritin, CRP - Maintain euvolemia/net negative.  - Avoid NSAIDs - Recommend proning and aggressive use of incentive spirometry.  Prediabetes:  - HbA1c 6.4%.  -Hyperglycemia likely in the setting of steroids, continue with insulin sliding scale  Anxiety:  - Continue home medication  CAD: No chest pain. Mild nonobstructive disease on LHC 2016.  - Continue plavix.  - DC telemetry to promote mobility. No significant/new arrhythmias on telemetry.  COPD:  - Continue albuterol, steroids as above. No wheezing on exam.   DVT prophylaxis: Lovenox Code  Status: Full Family Communication: Husband by phone again today. Disposition Plan: Uncertain, will get PT/OT. Possibly DC home 8/13.  Consultants:   None  Procedures:   None  Antimicrobials:  Remdesivir 8/8 - 8/12     Objective: Vitals:   06/16/19 1545 06/16/19 2005 06/17/19 0443 06/17/19 0727  BP: (!) 97/53 123/67 129/64 128/73  Pulse: 69  (!) 58 (!) 58  Resp:  (!) 22 18 (!) 21  Temp: 98.1 F (36.7 C) 98.2 F (36.8 C) 98 F (36.7 C) 98.5 F (36.9 C)  TempSrc: Oral Oral Oral Oral  SpO2: 93% 93% 95% 94%  Weight:      Height:        Intake/Output Summary (Last 24 hours) at 06/17/2019 1158 Last data filed at 06/17/2019 0911 Gross per 24 hour  Intake 1402.62 ml  Output -  Net 1402.62 ml   Filed Weights   06/13/19 1702  Weight: 81.6 kg    Awake Alert, Oriented X 3, No new F.N deficits, Normal affect Symmetrical Chest wall movement, Good air movement bilaterally, CTAB RRR,No Gallops,Rubs or new Murmurs, No Parasternal Heave +ve B.Sounds, Abd Soft, No tenderness, No rebound - guarding or rigidity. No Cyanosis, Clubbing or edema, No new Rash or bruise     Data Reviewed: I have personally reviewed following labs and imaging studies  CBC: Recent Labs  Lab 06/13/19 1726 06/14/19 0215 06/15/19 0240 06/16/19 0255 06/17/19 0300  WBC 8.4 4.4 13.4* 14.1* 13.0*  NEUTROABS 5.8 3.8 11.3* 12.0* 9.1*  HGB 12.2 11.7* 11.3* 11.4* 11.9*  HCT 38.2 36.7 35.4* 36.6 38.4  MCV 90.5 89.7 91.0 91.5 91.2  PLT 137* 147* 222  273 546   Basic Metabolic Panel: Recent Labs  Lab 06/13/19 1726 06/14/19 0215 06/15/19 0240 06/16/19 0255 06/17/19 0300  NA 138 142 145 143 143  K 3.6 3.7 4.0 4.1 4.1  CL 104 107 112* 105 105  CO2 23 26 25 25 29   GLUCOSE 113* 215* 152* 144* 95  BUN 7* 9 16 16 20   CREATININE 0.62 0.48 0.64 0.51 0.68  CALCIUM 8.5* 8.7* 8.9 8.8* 8.7*   GFR: Estimated Creatinine Clearance: 74.4 mL/min (by C-G formula based on SCr of 0.68 mg/dL). Liver Function  Tests: Recent Labs  Lab 06/13/19 1726 06/15/19 0240 06/16/19 0255 06/17/19 0300  AST 18 16 13* 17  ALT 19 18 18 22   ALKPHOS 43 38 43 46  BILITOT 0.9 0.2* 0.5 0.4  PROT 6.6 6.4* 6.2* 6.5  ALBUMIN 3.0* 2.8* 2.7* 3.0*   No results for input(s): LIPASE, AMYLASE in the last 168 hours. No results for input(s): AMMONIA in the last 168 hours. Coagulation Profile: No results for input(s): INR, PROTIME in the last 168 hours. Cardiac Enzymes: No results for input(s): CKTOTAL, CKMB, CKMBINDEX, TROPONINI in the last 168 hours. BNP (last 3 results) No results for input(s): PROBNP in the last 8760 hours. HbA1C: No results for input(s): HGBA1C in the last 72 hours. CBG: Recent Labs  Lab 06/16/19 0823 06/16/19 1222 06/16/19 1543 06/16/19 2110 06/17/19 0725  GLUCAP 138* 174* 231* 165* 82   Lipid Profile: No results for input(s): CHOL, HDL, LDLCALC, TRIG, CHOLHDL, LDLDIRECT in the last 72 hours. Thyroid Function Tests: No results for input(s): TSH, T4TOTAL, FREET4, T3FREE, THYROIDAB in the last 72 hours. Anemia Panel: Recent Labs    06/16/19 0255 06/17/19 0300  FERRITIN 347* 368*   Urine analysis:    Component Value Date/Time   COLORURINE YELLOW 06/16/2019 [place] 06/16/2019 1549   LABSPEC 1.008 06/16/2019 1549   PHURINE 6.0 06/16/2019 1549   GLUCOSEU NEGATIVE 06/16/2019 1549   HGBUR NEGATIVE 06/16/2019 1549   BILIRUBINUR NEGATIVE 06/16/2019 1549   BILIRUBINUR negative 08/21/2018 1031   KETONESUR NEGATIVE 06/16/2019 1549   PROTEINUR NEGATIVE 06/16/2019 1549   UROBILINOGEN 0.2 08/21/2018 1031   UROBILINOGEN 0.2 08/21/2013 2259   NITRITE NEGATIVE 06/16/2019 1549   LEUKOCYTESUR TRACE (A) 06/16/2019 1549   Recent Results (from the past 240 hour(s))  Blood Culture (routine x 2)     Status: None (Preliminary result)   Collection Time: 06/13/19  5:27 PM   Specimen: BLOOD LEFT ARM  Result Value Ref Range Status   Specimen Description BLOOD LEFT ARM  Final    Special Requests   Final    BOTTLES DRAWN AEROBIC AND ANAEROBIC Blood Culture adequate volume   Culture   Final    NO GROWTH 4 DAYS Performed at [facility], [address] [place], [place] [address]    Report Status PENDING  Incomplete  Blood Culture (routine x 2)     Status: None (Preliminary result)   Collection Time: 06/13/19  5:32 PM   Specimen: BLOOD RIGHT ARM  Result Value Ref Range Status   Specimen Description BLOOD RIGHT ARM  Final   Special Requests   Final    BOTTLES DRAWN AEROBIC AND ANAEROBIC Blood Culture adequate volume   Culture   Final    NO GROWTH 4 DAYS Performed at [facility], [address] [place], [place] [address]    Report Status PENDING  Incomplete      Radiology Studies: No results found.  Scheduled  Meds: . clopidogrel  75 mg Oral Daily  . dexamethasone (DECADRON) injection  6 mg Intravenous Q24H  . dextromethorphan-guaiFENesin  1 tablet Oral BID  . dicyclomine  10 mg Oral TID AC & HS  . enoxaparin (LOVENOX) injection  40 mg Subcutaneous Q24H  . fenofibrate  160 mg Oral Daily  . insulin aspart  0-15 Units Subcutaneous TID WC  . insulin aspart  0-5 Units Subcutaneous QHS  . Ipratropium-Albuterol  1 puff Inhalation Q6H  . LORazepam  1 mg Oral BID  . pantoprazole  40 mg Oral Daily  . rosuvastatin  40 mg Oral Daily  . vitamin C  500 mg Oral Daily  . zinc sulfate  220 mg Oral Daily   Continuous Infusions: . sodium chloride 10 mL/hr at 06/15/19 0200  . remdesivir 100 mg in NS 250 mL 100 mg (06/16/19 2234)     LOS: 4 days    Phillips Climes, MD [facility] [email or web address] Password [email or web address] 06/17/2019, 11:58 AM

## 2019-06-17 NOTE — Progress Notes (Signed)
Administered combivent 0200 dosage at 0655 resulted in pt was sleeping. 0200 she was resting, no noted respiratory distress. 1L N/C

## 2019-06-17 NOTE — Plan of Care (Signed)
  Problem: Education: Goal: Knowledge of risk factors and measures for prevention of condition will improve 06/17/2019 0022 by Blinda Leatherwood, RN Outcome: Progressing 06/16/2019 2134 by Lysbeth Penner D, RN Outcome: Progressing   Problem: Coping: Goal: Psychosocial and spiritual needs will be supported 06/17/2019 0022 by Blinda Leatherwood, RN Outcome: Progressing 06/16/2019 2134 by Lysbeth Penner D, RN Outcome: Progressing   Problem: Respiratory: Goal: Will maintain a patent airway 06/17/2019 0022 by Lysbeth Penner D, RN Outcome: Progressing 06/16/2019 2134 by Lysbeth Penner D, RN Outcome: Progressing Goal: Complications related to the disease process, condition or treatment will be avoided or minimized 06/17/2019 0022 by Blinda Leatherwood, RN Outcome: Progressing 06/16/2019 2134 by Blinda Leatherwood, RN Outcome: Progressing   Problem: Education: Goal: Knowledge of General Education information will improve Description: Including pain rating scale, medication(s)/side effects and non-pharmacologic comfort measures 06/17/2019 0022 by Blinda Leatherwood, RN Outcome: Progressing 06/16/2019 2134 by Blinda Leatherwood, RN Outcome: Progressing   Problem: Health Behavior/Discharge Planning: Goal: Ability to manage health-related needs will improve 06/17/2019 0022 by Blinda Leatherwood, RN Outcome: Progressing 06/16/2019 2134 by Lysbeth Penner D, RN Outcome: Progressing   Problem: Clinical Measurements: Goal: Ability to maintain clinical measurements within normal limits will improve 06/17/2019 0022 by Lysbeth Penner D, RN Outcome: Progressing 06/16/2019 2134 by Lysbeth Penner D, RN Outcome: Progressing Goal: Will remain free from infection 06/17/2019 0022 by Lysbeth Penner D, RN Outcome: Progressing 06/16/2019 2134 by Lysbeth Penner D, RN Outcome: Progressing Goal: Diagnostic test results will improve 06/17/2019 0022 by Lysbeth Penner D, RN Outcome: Progressing 06/16/2019 2134 by Lysbeth Penner D, RN Outcome:  Progressing Goal: Respiratory complications will improve 06/17/2019 0022 by Lysbeth Penner D, RN Outcome: Progressing 06/16/2019 2134 by Lysbeth Penner D, RN Outcome: Progressing   Problem: Activity: Goal: Risk for activity intolerance will decrease 06/17/2019 0022 by Lysbeth Penner D, RN Outcome: Progressing 06/16/2019 2134 by Lysbeth Penner D, RN Outcome: Progressing   Problem: Coping: Goal: Level of anxiety will decrease 06/17/2019 0022 by Blinda Leatherwood, RN Outcome: Progressing 06/16/2019 2134 by Lysbeth Penner D, RN Outcome: Progressing   Problem: Pain Managment: Goal: General experience of comfort will improve 06/17/2019 0022 by Blinda Leatherwood, RN Outcome: Progressing 06/16/2019 2134 by Lysbeth Penner D, RN Outcome: Progressing   Problem: Safety: Goal: Ability to remain free from injury will improve 06/17/2019 0022 by Blinda Leatherwood, RN Outcome: Progressing 06/16/2019 2134 by Lysbeth Penner D, RN Outcome: Progressing   Problem: Skin Integrity: Goal: Risk for impaired skin integrity will decrease 06/17/2019 0022 by Lysbeth Penner D, RN Outcome: Progressing 06/16/2019 2134 by Blinda Leatherwood, RN Outcome: Progressing

## 2019-06-17 NOTE — Plan of Care (Signed)
  Problem: Education: Goal: Knowledge of risk factors and measures for prevention of condition will improve Outcome: Progressing   Problem: Coping: Goal: Psychosocial and spiritual needs will be supported Outcome: Progressing   Problem: Respiratory: Goal: Will maintain a patent airway Outcome: Progressing Goal: Complications related to the disease process, condition or treatment will be avoided or minimized Outcome: Progressing   Problem: Education: Goal: Knowledge of General Education information will improve Description: Including pain rating scale, medication(s)/side effects and non-pharmacologic comfort measures Outcome: Progressing   Problem: Health Behavior/Discharge Planning: Goal: Ability to manage health-related needs will improve Outcome: Progressing   Problem: Clinical Measurements: Goal: Ability to maintain clinical measurements within normal limits will improve Outcome: Progressing Goal: Will remain free from infection Outcome: Progressing Goal: Diagnostic test results will improve Outcome: Progressing Goal: Respiratory complications will improve Outcome: Progressing   Problem: Activity: Goal: Risk for activity intolerance will decrease Outcome: Progressing   Problem: Coping: Goal: Level of anxiety will decrease Outcome: Progressing   Problem: Pain Managment: Goal: General experience of comfort will improve Outcome: Progressing   Problem: Safety: Goal: Ability to remain free from injury will improve Outcome: Progressing   Problem: Skin Integrity: Goal: Risk for impaired skin integrity will decrease Outcome: Progressing

## 2019-06-17 NOTE — Progress Notes (Signed)
Unable to reach spouse, left a message.

## 2019-06-18 LAB — CULTURE, BLOOD (ROUTINE X 2)
Culture: NO GROWTH
Culture: NO GROWTH
Special Requests: ADEQUATE
Special Requests: ADEQUATE

## 2019-06-18 LAB — COMPREHENSIVE METABOLIC PANEL
ALT: 23 U/L (ref 0–44)
AST: 14 U/L — ABNORMAL LOW (ref 15–41)
Albumin: 2.9 g/dL — ABNORMAL LOW (ref 3.5–5.0)
Alkaline Phosphatase: 45 U/L (ref 38–126)
Anion gap: 15 (ref 5–15)
BUN: 22 mg/dL (ref 8–23)
CO2: 26 mmol/L (ref 22–32)
Calcium: 9.1 mg/dL (ref 8.9–10.3)
Chloride: 101 mmol/L (ref 98–111)
Creatinine, Ser: 0.68 mg/dL (ref 0.44–1.00)
GFR calc Af Amer: >60 mL/min (ref >60)
GFR calc non Af Amer: >60 mL/min (ref >60)
Glucose, Bld: 95 mg/dL (ref 70–99)
Potassium: 4 mmol/L (ref 3.5–5.1)
Sodium: 142 mmol/L (ref 135–145)
Total Bilirubin: 0.4 mg/dL (ref 0.3–1.2)
Total Protein: 6.5 g/dL (ref 6.5–8.1)

## 2019-06-18 LAB — CBC WITH DIFFERENTIAL/PLATELET
Abs Immature Granulocytes: 0.15 10*3/uL — ABNORMAL HIGH (ref 0.00–0.07)
Basophils Absolute: 0 10*3/uL (ref 0.0–0.1)
Basophils Relative: 0 %
Eosinophils Absolute: 0 10*3/uL (ref 0.0–0.5)
Eosinophils Relative: 0 %
HCT: 40.1 % (ref 36.0–46.0)
Hemoglobin: 13 g/dL (ref 12.0–15.0)
Immature Granulocytes: 1 %
Lymphocytes Relative: 21 %
Lymphs Abs: 2.7 10*3/uL (ref 0.7–4.0)
MCH: 29.1 pg (ref 26.0–34.0)
MCHC: 32.4 g/dL (ref 30.0–36.0)
MCV: 89.9 fL (ref 80.0–100.0)
Monocytes Absolute: 1 10*3/uL (ref 0.1–1.0)
Monocytes Relative: 8 %
Neutro Abs: 9.2 10*3/uL — ABNORMAL HIGH (ref 1.7–7.7)
Neutrophils Relative %: 70 %
Platelets: 331 10*3/uL (ref 150–400)
RBC: 4.46 MIL/uL (ref 3.87–5.11)
RDW: 13.9 % (ref 11.5–15.5)
WBC: 13.1 10*3/uL — ABNORMAL HIGH (ref 4.0–10.5)
nRBC: 0 % (ref 0.0–0.2)

## 2019-06-18 LAB — C-REACTIVE PROTEIN: CRP: 1.3 mg/dL — ABNORMAL HIGH (ref <1.0)

## 2019-06-18 LAB — FERRITIN: Ferritin: 409 ng/mL — ABNORMAL HIGH (ref 11–307)

## 2019-06-18 LAB — GLUCOSE, CAPILLARY: Glucose-Capillary: 91 mg/dL (ref 70–99)

## 2019-06-18 LAB — CBC AND DIFFERENTIAL: WBC: 13.1

## 2019-06-18 LAB — D-DIMER, QUANTITATIVE: D-Dimer, Quant: 0.66 ug/mL-FEU — ABNORMAL HIGH (ref 0.00–0.50)

## 2019-06-18 LAB — HEPATIC FUNCTION PANEL: Bilirubin, Total: 0.4

## 2019-06-18 LAB — BASIC METABOLIC PANEL
BUN: 26 — AB (ref 4–21)
Creatinine: 0.7 (ref 0.5–1.1)
Sodium: 142 (ref 137–147)

## 2019-06-18 MED ORDER — ASCORBIC ACID 500 MG PO TABS: 500.0000 mg | ORAL_TABLET | Freq: Every day | ORAL | 0 refills | Status: AC

## 2019-06-18 MED ORDER — DEXAMETHASONE 4 MG PO TABS: 4.0000 mg | ORAL_TABLET | Freq: Two times a day (BID) | ORAL | 0 refills | Status: DC

## 2019-06-18 MED ORDER — ZINC SULFATE 220 (50 ZN) MG PO CAPS: 220.0000 mg | ORAL_CAPSULE | Freq: Every day | ORAL | Status: AC

## 2019-06-18 NOTE — Progress Notes (Signed)
Pt discharged home with family at approximately 37 this AM. She was taken via w/c to front of hospital to meet family.

## 2019-06-18 NOTE — Discharge Summary (Addendum)
Brenda [name], is a 63 y.o. female  DOB 06-07-1956  MRN [id number].  Admission date:  06/13/2019  Admitting Physician  Bethena Roys, MD  Discharge Date:  06/18/2019   Primary MD  Tammi Sou, MD  Recommendations for primary care physician for things to follow:  -Please check CBC, CMP during next visit    Admission Diagnosis  Hypoxia [R09.02] Community acquired pneumonia, unspecified laterality [J18.9] COVID-19 [U07.1, J98.8]   Discharge Diagnosis  Hypoxia [R09.02] Community acquired pneumonia, unspecified laterality [J18.9] COVID-19 [U07.1, J98.8]   Active Problems:   Pneumonia due to COVID-19 virus      Past Medical History:  Diagnosis Date   Aortic valve stenosis    "very mild"   Atypical chest pain 2007; 2015   cardiolyte neg, echo nl, cath showed mild/nonobstructive LAD disease   Bilateral primary osteoarthritis of knee 06/30/2012   Right >L.  Right unicompartmental knee arthroplasty 10/25/18   CAD (coronary artery disease)    Chronic LBP    Multiple surgeries   COPD (chronic obstructive pulmonary disease) ([place])    COVID-19 virus infection 06/02/2019   Eval at [facility] ED and d/c'd home.  Admitted 06/12/19 with hypoxic RF and bilat infiltrates.   DDD (degenerative disc disease)    spinal stenosis   Fatty liver    History of GI bleed    NSAIDS   Hyperlipidemia    mixed   Microscopic hematuria    full w/ u unrevealing X 2   Normal nuclear stress test 11/11 and 06/2014   negative, EF normal   Palpitations 2006   48H holter neg   PONV (postoperative nausea and vomiting)    "never threw up but felt sick on my stomach", also believes that she was aware of surgery during her hysterectomy    Pre-diabetes    Prediabetes    Highest A1c 6.1% as of 03/2017   RBBB (right bundle branch block)    Recurrent UTI    Tobacco dependence in remission    Quit fall  2015.  Only occasional bronchitis illness (CT shest 2009 nl- for f/u of ? nodule on CXR)    Past Surgical History:  Procedure Laterality Date   ABDOMINAL HYSTERECTOMY  1997   DUB   APPENDECTOMY  1984   CARDIAC CATHETERIZATION  10/09/2005   no CAD, mildly elevated LVEDP, normal LV function (Dr. Gerrie Nordmann)   CARDIAC CATHETERIZATION N/A 05/16/2015   Mild non-obstructive CAD--med mgmt.  Procedure: Left Heart Cath and Coronary Angiography;  Surgeon: Pixie Casino, MD;  Location: [facility];  Service: Cardiovascular;  Laterality: N/A;   CARDIOVASCULAR STRESS TEST  06/2014   normal lexiscan NST   CARDIOVASCULAR STRESS TEST  2006   persantine - no ischemia, low risk    KNEE ARTHROSCOPY Bilateral    left wrist ganglion cyst excision  2010   [facility]   right iliac crest bone graft+metal instrumentation; 2005 metal removal and decompression, 2011 lumbar decompression 4-11, then stabilization/ instrumentation  done 09-19-10: L2,L3, L5 left and L2 , L3, L4 right pedical remnant L4 left embedded. Left iliac crest bone graft-- Dr Zollie Pee SPINE SURGERY  02/10/2016   Dr. Velna Ochs: lumbar decompression, instrumentation removal, and fusion exploration--HELPED her a lot, esp her radicular leg pains.   OOPHORECTOMY Right    cyst   PARTIAL KNEE ARTHROPLASTY Right 11/04/2018   Procedure: UNICOMPARTMENTAL KNEE;  Surgeon: Renette Butters, MD;  Location: [facility];  Service: Orthopedics;  Laterality: Right;  Adductor Block   TONSILLECTOMY  63 yrs old   TONSILLECTOMY     TRANSTHORACIC ECHOCARDIOGRAM  2006   EF=>55%, trace MR, mild TR, trace AV regurg, trace pulm valve regurg        History of present illness and  Hospital Course:     Kindly see H&P for history of present illness and admission details, please review complete Labs, Consult reports and Test reports for all details in brief  HPI  from the history and physical done on the day of admission  06/13/2019  HPI: Brenda [name] is a 63 y.o. female with medical history significant for prediabetes mellitus, COPD, coronary artery disease, fatty liver, presented to the ED with increasing difficulty breathing, cough, fever and chills. Per ED note 7/28, patient was in close contact with someone on Sunday in church, who recently tested positive for COVID-19. Patient got tested for COVID 06/02/2019 and 2 tests on that day came back positive. Patient self quarantined at home, spouse and son tested negative. Patient was prescribed steroids by her primary care provider. Today with increasing difficulty breathing, patient spouse who had COPD, used his home pulse ox to check patient's O2 saturation-rate 84%.  Patient came to the ED.  ED Course: O2 sats 84% on room air-improvement to greater than 95% on 3 L nasal cannula, blood pressure systolic 785 - 885O.. D-dimer elevated 1.12, ferritin 310, fibrinogen 708, CRP 15.9. Negative Hs Trop. Blood cultures obtained. Port chest x-ray-development of relatively diffuse interstitial opacities which are suspicious for atypical/viral pneumonia. Mild pulmonary venous congestion could look similar. Patient was given 10 mg of Decadron IV. Hospitalist to admit for COVID-19 pneumonia.   Hospital Course   Brenda [name] is a 63 y.o. female with a history of CAD, COPD, and prediabetes who presented with several days of fever, chills, night sweats and a day of progressive shortness of breath. She had a positive covid exposure on 8/2, developed symptoms a couple days later. She was forced to take EMS to the ED by her husband who found her to be hypoxic at home. SpO2 was 84% on arrival, inflammatory markers elevated and CXR demonstrated diffuse bilateral opacities. Steroids were given and the patient admitted to [facility] where remdesivir was started and, after discussion of off-label nature, tocilizumab was administered.    Acute hypoxic respiratory failure due to covid-19  pneumonia:  -Felt to be at risk of progression to ARDS on admission, treated with IV steroids, then she was transitioned to Decadron, she was treated with 5 days of IV Remdesivir, she did receive Actemra 8/9, telemetry markers trended daily, they are trending down, she is currently on room air.  No further steroids on discharge as she finished more than 10 days  Prediabetes:  - HbA1c 6.4%.  -Hyperglycemia likely in the setting of steroids, treated with sliding scale during hospital stay Anxiety:  - Continue home medication  CAD: No chest pain. Mild nonobstructive disease on LHC 2016.  - Continue plavix.  COPD:  - Continue albuterol, steroids as above. No wheezing on exam.    Discharge Condition:  Stable   Follow UP  Follow-up Information    McGowen, Adrian Blackwater, MD Follow up in 2 week(s).   Specialty: Family Medicine Contact information: [id number] [place] [address] [phone]             Discharge Instructions  and  Discharge Medications     Discharge Instructions    Diet - low sodium heart healthy   Complete by: As directed    Discharge instructions   Complete by: As directed    Follow with Primary MD McGowen, Adrian Blackwater, MD in 7 days   Get CBC, CMP,  checked  by Primary MD next visit.    Activity: As tolerated with Full fall precautions use walker/cane & assistance as needed   Disposition Home    Diet: Heart Healthy  , with feeding assistance and aspiration precautions.  For Heart failure patients - Check your Weight same time everyday, if you gain over 2 pounds, or you develop in leg swelling, experience more shortness of breath or chest pain, call your Primary MD immediately. Follow Cardiac Low Salt Diet and 1.5 lit/day fluid restriction.   On your next visit with your primary care physician please Get Medicines reviewed and adjusted.   Please request your Prim.MD to go over all Hospital Tests and Procedure/Radiological results at  the follow up, please get all Hospital records sent to your Prim MD by signing hospital release before you go home.   If you experience worsening of your admission symptoms, develop shortness of breath, life threatening emergency, suicidal or homicidal thoughts you must seek medical attention immediately by calling 911 or calling your MD immediately  if symptoms less severe.  You Must read complete instructions/literature along with all the possible adverse reactions/side effects for all the Medicines you take and that have been prescribed to you. Take any new Medicines after you have completely understood and accpet all the possible adverse reactions/side effects.   Do not drive, operating heavy machinery, perform activities at heights, swimming or participation in water activities or provide baby sitting services if your were admitted for syncope or siezures until you have seen by Primary MD or a Neurologist and advised to do so again.  Do not drive when taking Pain medications.    Do not take more than prescribed Pain, Sleep and Anxiety Medications  Special Instructions: If you have smoked or chewed Tobacco  in the last 2 yrs please stop smoking, stop any regular Alcohol  and or any Recreational drug use.  Wear Seat belts while driving.   Please note  You were cared for by a hospitalist during your hospital stay. If you have any questions about your discharge medications or the care you received while you were in the hospital after you are discharged, you can call the unit and asked to speak with the hospitalist on call if the hospitalist that took care of you is not available. Once you are discharged, your primary care physician will handle any further medical issues. Please note that NO REFILLS for any discharge medications will be authorized once you are discharged, as it is imperative that you return to your primary care physician (or establish a relationship with a primary care physician  if you do not have one) for your aftercare needs so that they can reassess your need for medications and monitor your lab values.  Increase activity slowly   Complete by: As directed    MyChart COVID-19 home monitoring program   Complete by: Jun 18, 2019    Is the patient willing to use the [facility] for home monitoring?: Yes   Temperature monitoring   Complete by: Jun 18, 2019    After how many days would you like to receive a notification of this patient's flowsheet entries?: 1     Allergies as of 06/18/2019      Reactions   Aspirin Other (See Comments)    GI Bleed   Beta Adrenergic Blockers Other (See Comments)   REACTION: decreased libido   Nsaids Other (See Comments)    GI Upset   Penicillins Swelling, Other (See Comments)   DID THE REACTION INVOLVE: Swelling of the face/tongue/throat, SOB, or low BP? Yes Sudden or severe rash/hives, skin peeling, or the inside of the mouth or nose? Yes Did it require medical treatment? Yes When did it last happen? 63 years old If all above answers are NO, may proceed with cephalosporin use.   Prochlorperazine Edisylate Other (See Comments)    Stroke like symptoms   Sulfonamide Derivatives Other (See Comments)   Unknown allergic reaction   Amitriptyline Hcl Palpitations, Other (See Comments)    increased heart rate      Medication List    STOP taking these medications   benzonatate 200 MG capsule Commonly known as: TESSALON   dexamethasone 4 MG tablet Commonly known as: DECADRON   dextromethorphan-guaiFENesin 30-600 MG 12hr tablet Commonly known as: MUCINEX DM   HYDROcodone-homatropine 5-1.5 MG/5ML syrup Commonly known as: HYCODAN     TAKE these medications   acetaminophen 500 MG tablet Commonly known as: TYLENOL every 6 (six) hours as needed for mild pain or headache.   albuterol 108 (90 Base) MCG/ACT inhaler Commonly known as: VENTOLIN HFA Inhale 2 puffs into the lungs every 4 (four) hours as needed for  wheezing or shortness of breath (bronchitis). Reported on 03/12/2016   ascorbic acid 500 MG tablet Commonly known as: VITAMIN C Take 1 tablet (500 mg total) by mouth daily for 10 days. Start taking on: June 19, 2019   clopidogrel 75 MG tablet Commonly known as: Plavix Take 1 tablet (75 mg total) by mouth daily.   cyclobenzaprine 10 MG tablet Commonly known as: FLEXERIL take 1 tablet by mouth every 8 hours if needed What changed:   how much to take  how to take this  when to take this  reasons to take this  additional instructions   dicyclomine 10 MG capsule Commonly known as: Bentyl Take 1 capsule (10 mg total) by mouth 4 (four) times daily -  before meals and at bedtime.   fenofibrate 160 MG tablet take 1 tablet by mouth once daily with food What changed:   how much to take  how to take this  when to take this  additional instructions   hydrocortisone 25 MG suppository Commonly known as: ANUSOL-HC Place 1 suppository (25 mg total) rectally 2 (two) times daily.   LORazepam 1 MG tablet Commonly known as: ATIVAN Take 1 tablet (1 mg total) by mouth 2 (two) times daily.   nitroGLYCERIN 0.4 MG SL tablet Commonly known as: NITROSTAT Place 1 tablet (0.4 mg total) under the tongue every 5 (five) minutes as needed for chest pain (MAX 3 doses.).   oxyCODONE 5 MG immediate release tablet Commonly known as: Oxy IR/ROXICODONE 1-2 po tid prn pain   pantoprazole 40 MG  tablet Commonly known as: PROTONIX Take 1 tablet (40 mg total) by mouth daily.   rosuvastatin 40 MG tablet Commonly known as: CRESTOR Take 1 tablet (40 mg total) by mouth daily.   sodium chloride 0.65 % Soln nasal spray Commonly known as: OCEAN Place 1 spray into both nostrils as needed for congestion.   zinc sulfate 220 (50 Zn) MG capsule Take 1 capsule (220 mg total) by mouth daily for 10 days. Start taking on: June 19, 2019         Diet and Activity recommendation: See Discharge  Instructions above   Consults obtained - none   Major procedures and Radiology Reports - PLEASE review detailed and final reports for all details, in brief -      Dg Chest Portable 1 View  Result Date: 06/13/2019 CLINICAL DATA:  Shortness of breath. COVID-19 positive. Fever and chills. EXAM: PORTABLE CHEST 1 VIEW COMPARISON:  06/02/2019 FINDINGS: Midline trachea. Normal heart size. No pleural effusion or pneumothorax. Development of pulmonary interstitial prominence with interstitial opacities bilaterally. No well-defined lobar consolidation. IMPRESSION: Development of relatively diffuse interstitial opacities which, given the clinical history are suspicious for atypical/viral pneumonia. In the appropriate clinical setting, mild pulmonary venous congestion could look similar. Electronically Signed   By: Abigail Miyamoto M.D.   On: 06/13/2019 18:10   Dg Chest Portable 1 View  Result Date: 06/02/2019 CLINICAL DATA:  Fever, cough EXAM: PORTABLE CHEST 1 VIEW COMPARISON:  11/26/2018 FINDINGS: The heart size and mediastinal contours are within normal limits. Both lungs are clear. The visualized skeletal structures are unremarkable. IMPRESSION: No active disease. Electronically Signed   By: Kathreen Devoid   On: 06/02/2019 17:19    Micro Results     Recent Results (from the past 240 hour(s))  Blood Culture (routine x 2)     Status: None   Collection Time: 06/13/19  5:27 PM   Specimen: BLOOD LEFT ARM  Result Value Ref Range Status   Specimen Description BLOOD LEFT ARM  Final   Special Requests   Final    BOTTLES DRAWN AEROBIC AND ANAEROBIC Blood Culture adequate volume   Culture   Final    NO GROWTH 5 DAYS Performed at [facility], [address] [place], [place] [address]    Report Status 06/18/2019 FINAL  Final  Blood Culture (routine x 2)     Status: None   Collection Time: 06/13/19  5:32 PM   Specimen: BLOOD RIGHT ARM  Result Value Ref Range Status   Specimen Description BLOOD RIGHT ARM   Final   Special Requests   Final    BOTTLES DRAWN AEROBIC AND ANAEROBIC Blood Culture adequate volume   Culture   Final    NO GROWTH 5 DAYS Performed at [facility], [address] [place], [place] [address]    Report Status 06/18/2019 FINAL  Final       Today   Subjective:   Brenda [name] today has no headache,no chest abdominal pain,no new weakness tingling or numbness, feels much better wants to go home today.   Objective:   Blood pressure 135/60, pulse 72, temperature 98.2 F (36.8 C), temperature source Oral, resp. rate 16, height 5\' 4"  (1.626 m), weight 81.6 kg, SpO2 92 %.   Intake/Output Summary (Last 24 hours) at 06/18/2019 1146 Last data filed at 06/18/2019 0950 Gross per 24 hour  Intake 1490 ml  Output 350 ml  Net 1140 ml    Exam Awake Alert, Oriented x 3, No new  F.N deficits, Normal affect Symmetrical Chest wall movement, Good air movement bilaterally, CTAB RRR,No Gallops,Rubs or new Murmurs, No Parasternal Heave +ve B.Sounds, Abd Soft, Non tender,No rebound -guarding or rigidity. No Cyanosis, Clubbing or edema, No new Rash or bruise  Data Review   CBC w Diff:  Lab Results  Component Value Date   WBC 13.1 (H) 06/18/2019   HGB 13.0 06/18/2019   HCT 40.1 06/18/2019   PLT 331 06/18/2019   LYMPHOPCT 21 06/18/2019   MONOPCT 8 06/18/2019   EOSPCT 0 06/18/2019   BASOPCT 0 06/18/2019    CMP:  Lab Results  Component Value Date   NA 142 06/18/2019   K 4.0 06/18/2019   CL 101 06/18/2019   CO2 26 06/18/2019   BUN 22 06/18/2019   CREATININE 0.68 06/18/2019   CREATININE 0.69 06/29/2011   PROT 6.5 06/18/2019   ALBUMIN 2.9 (L) 06/18/2019   BILITOT 0.4 06/18/2019   ALKPHOS 45 06/18/2019   AST 14 (L) 06/18/2019   ALT 23 06/18/2019  .   Total Time in preparing paper work, data evaluation and todays exam - 3 minutes  Phillips Climes M.D on 06/18/2019 at 11:46 AM  [facility]   Office  [phone]

## 2019-06-18 NOTE — Plan of Care (Signed)
Problem: Education: Goal: Knowledge of risk factors and measures for prevention of condition will improve 06/18/2019 1102 by Shanon Ace, RN Outcome: Adequate for Discharge 06/18/2019 1102 by Shanon Ace, RN Outcome: Adequate for Discharge 06/18/2019 0722 by Shanon Ace, RN Outcome: Progressing   Problem: Coping: Goal: Psychosocial and spiritual needs will be supported 06/18/2019 1102 by Shanon Ace, RN Outcome: Adequate for Discharge 06/18/2019 1102 by Shanon Ace, RN Outcome: Adequate for Discharge 06/18/2019 0722 by Shanon Ace, RN Outcome: Progressing   Problem: Respiratory: Goal: Will maintain a patent airway 06/18/2019 1102 by Shanon Ace, RN Outcome: Adequate for Discharge 06/18/2019 1102 by Shanon Ace, RN Outcome: Adequate for Discharge 06/18/2019 0722 by Shanon Ace, RN Outcome: Progressing Goal: Complications related to the disease process, condition or treatment will be avoided or minimized 06/18/2019 1102 by Shanon Ace, RN Outcome: Adequate for Discharge 06/18/2019 1102 by Shanon Ace, RN Outcome: Adequate for Discharge 06/18/2019 0722 by Shanon Ace, RN Outcome: Progressing   Problem: Education: Goal: Knowledge of General Education information will improve Description: Including pain rating scale, medication(s)/side effects and non-pharmacologic comfort measures 06/18/2019 1102 by Shanon Ace, RN Outcome: Adequate for Discharge 06/18/2019 1102 by Shanon Ace, RN Outcome: Adequate for Discharge 06/18/2019 0722 by Shanon Ace, RN Outcome: Progressing   Problem: Health Behavior/Discharge Planning: Goal: Ability to manage health-related needs will improve 06/18/2019 1102 by Shanon Ace, RN Outcome: Adequate for Discharge 06/18/2019 1102 by Shanon Ace, RN Outcome: Adequate for Discharge 06/18/2019 0722 by Shanon Ace, RN Outcome: Progressing   Problem: Clinical Measurements: Goal: Ability to maintain clinical measurements within  normal limits will improve 06/18/2019 1102 by Shanon Ace, RN Outcome: Adequate for Discharge 06/18/2019 1102 by Shanon Ace, RN Outcome: Adequate for Discharge 06/18/2019 0722 by Shanon Ace, RN Outcome: Progressing Goal: Will remain free from infection 06/18/2019 1102 by Shanon Ace, RN Outcome: Adequate for Discharge 06/18/2019 1102 by Shanon Ace, RN Outcome: Adequate for Discharge 06/18/2019 0722 by Shanon Ace, RN Outcome: Progressing Goal: Diagnostic test results will improve 06/18/2019 1102 by Shanon Ace, RN Outcome: Adequate for Discharge 06/18/2019 1102 by Shanon Ace, RN Outcome: Adequate for Discharge 06/18/2019 0722 by Shanon Ace, RN Outcome: Progressing Goal: Respiratory complications will improve 06/18/2019 1102 by Shanon Ace, RN Outcome: Adequate for Discharge 06/18/2019 1102 by Shanon Ace, RN Outcome: Adequate for Discharge 06/18/2019 0722 by Shanon Ace, RN Outcome: Progressing   Problem: Activity: Goal: Risk for activity intolerance will decrease 06/18/2019 1102 by Shanon Ace, RN Outcome: Adequate for Discharge 06/18/2019 1102 by Shanon Ace, RN Outcome: Adequate for Discharge 06/18/2019 0722 by Shanon Ace, RN Outcome: Progressing   Problem: Coping: Goal: Level of anxiety will decrease 06/18/2019 1102 by Shanon Ace, RN Outcome: Adequate for Discharge 06/18/2019 1102 by Shanon Ace, RN Outcome: Adequate for Discharge 06/18/2019 0722 by Shanon Ace, RN Outcome: Progressing   Problem: Pain Managment: Goal: General experience of comfort will improve 06/18/2019 1102 by Shanon Ace, RN Outcome: Adequate for Discharge 06/18/2019 1102 by Shanon Ace, RN Outcome: Adequate for Discharge 06/18/2019 0722 by Shanon Ace, RN Outcome: Progressing   Problem: Safety: Goal: Ability to remain free from injury will improve 06/18/2019 1102 by Shanon Ace, RN Outcome: Adequate for Discharge 06/18/2019 1102 by Shanon Ace,  RN Outcome: Adequate for Discharge 06/18/2019 0722 by Shanon Ace, RN Outcome: Progressing   Problem: Skin Integrity: Goal: Risk for impaired skin integrity will decrease 06/18/2019 1102 by Shanon Ace, RN Outcome: Adequate for Discharge 06/18/2019 1102 by Shanon Ace, RN Outcome: Adequate for Discharge 06/18/2019 0722 by Shanon Ace,  RN Outcome: Progressing

## 2019-06-18 NOTE — Plan of Care (Signed)
  Problem: Education: Goal: Knowledge of risk factors and measures for prevention of condition will improve Outcome: Progressing   Problem: Coping: Goal: Psychosocial and spiritual needs will be supported Outcome: Progressing   Problem: Respiratory: Goal: Will maintain a patent airway Outcome: Progressing Goal: Complications related to the disease process, condition or treatment will be avoided or minimized Outcome: Progressing   Problem: Education: Goal: Knowledge of General Education information will improve Description: Including pain rating scale, medication(s)/side effects and non-pharmacologic comfort measures Outcome: Progressing   Problem: Health Behavior/Discharge Planning: Goal: Ability to manage health-related needs will improve Outcome: Progressing   Problem: Clinical Measurements: Goal: Ability to maintain clinical measurements within normal limits will improve Outcome: Progressing Goal: Will remain free from infection Outcome: Progressing Goal: Diagnostic test results will improve Outcome: Progressing Goal: Respiratory complications will improve Outcome: Progressing   Problem: Activity: Goal: Risk for activity intolerance will decrease Outcome: Progressing   Problem: Coping: Goal: Level of anxiety will decrease Outcome: Progressing   Problem: Pain Managment: Goal: General experience of comfort will improve Outcome: Progressing   Problem: Safety: Goal: Ability to remain free from injury will improve Outcome: Progressing   Problem: Skin Integrity: Goal: Risk for impaired skin integrity will decrease Outcome: Progressing

## 2019-06-18 NOTE — Progress Notes (Signed)
Physical Therapy Treatment Patient Details Name: Brenda [name] MRN: [id number] DOB: 01/16/1956 Today's Date: 06/18/2019    History of Present Illness 63 y.o. female with a history of CAD, COPD, and prediabetes who presented with several days of fever, chills, night sweats and a day of progressive shortness of breath. She had a positive covid exposure on 8/2, developed symptoms a couple days later. She was forced to take EMS to the ED by her husband who found her to be hypoxic at home. SpO2 was 84% on arrival, inflammatory markers elevated and CXR demonstrated diffuse bilateral opacities. Steroids were given and the patient admitted to [facility] where remdesivir was started and, after discussion of off-label nature, tocilizumab was administered.    PT Comments    Patient is independent on RA. Plans Dc today. Visited spouse. No further PT needs at this time. PT to sign off.   Follow Up Recommendations  No PT follow up     Equipment Recommendations  None recommended by PT    Recommendations for Other Services       Precautions / Restrictions Precautions Precautions: None    Mobility  Bed Mobility                  Transfers Overall transfer level: Independent                  Ambulation/Gait Ambulation/Gait assistance: Independent Gait Distance (Feet): 400 Feet Assistive device: None Gait Pattern/deviations: WFL(Within Functional Limits)         Stairs             Wheelchair Mobility    Modified Rankin (Stroke Patients Only)       Balance                                            Cognition Arousal/Alertness: Awake/alert                                            Exercises      General Comments        Pertinent Vitals/Pain Pain Assessment: No/denies pain    Home Living                      Prior Function            PT Goals (current goals can now be found in the care plan section)  Progress towards PT goals: Progressing toward goals    Frequency    Min 3X/week      PT Plan Current plan remains appropriate    Co-evaluation              AM-PAC PT "6 Clicks" Mobility   Outcome Measure  Help needed turning from your back to your side while in a flat bed without using bedrails?: None Help needed moving from lying on your back to sitting on the side of a flat bed without using bedrails?: None Help needed moving to and from a bed to a chair (including a wheelchair)?: None Help needed standing up from a chair using your arms (e.g., wheelchair or bedside chair)?: None Help needed to walk in hospital room?: None Help needed climbing 3-5 steps with a railing? : A Little 6 Click  Score: 23    End of Session   Activity Tolerance: Patient tolerated treatment well Patient left: in chair Nurse Communication: Mobility status       Time: 9211-9417 PT Time Calculation (min) (ACUTE ONLY): 20 min  Charges:  $Gait Training: 8-22 mins                     [place]  Office [phone]    [name] 06/18/2019, 8:37 AM

## 2019-06-18 NOTE — Plan of Care (Signed)
  Problem: Education: Goal: Knowledge of risk factors and measures for prevention of condition will improve 06/18/2019 1102 by Shanon Ace, RN Outcome: Adequate for Discharge 06/18/2019 0722 by Shanon Ace, RN Outcome: Progressing   Problem: Coping: Goal: Psychosocial and spiritual needs will be supported 06/18/2019 1102 by Shanon Ace, RN Outcome: Adequate for Discharge 06/18/2019 0722 by Shanon Ace, RN Outcome: Progressing   Problem: Respiratory: Goal: Will maintain a patent airway 06/18/2019 1102 by Shanon Ace, RN Outcome: Adequate for Discharge 06/18/2019 0722 by Shanon Ace, RN Outcome: Progressing Goal: Complications related to the disease process, condition or treatment will be avoided or minimized 06/18/2019 1102 by Shanon Ace, RN Outcome: Adequate for Discharge 06/18/2019 0722 by Shanon Ace, RN Outcome: Progressing   Problem: Education: Goal: Knowledge of General Education information will improve Description: Including pain rating scale, medication(s)/side effects and non-pharmacologic comfort measures 06/18/2019 1102 by Shanon Ace, RN Outcome: Adequate for Discharge 06/18/2019 0722 by Shanon Ace, RN Outcome: Progressing   Problem: Health Behavior/Discharge Planning: Goal: Ability to manage health-related needs will improve 06/18/2019 1102 by Shanon Ace, RN Outcome: Adequate for Discharge 06/18/2019 0722 by Shanon Ace, RN Outcome: Progressing   Problem: Clinical Measurements: Goal: Ability to maintain clinical measurements within normal limits will improve 06/18/2019 1102 by Shanon Ace, RN Outcome: Adequate for Discharge 06/18/2019 0722 by Shanon Ace, RN Outcome: Progressing Goal: Will remain free from infection 06/18/2019 1102 by Shanon Ace, RN Outcome: Adequate for Discharge 06/18/2019 0722 by Shanon Ace, RN Outcome: Progressing Goal: Diagnostic test results will improve 06/18/2019 1102 by Shanon Ace, RN Outcome: Adequate  for Discharge 06/18/2019 0722 by Shanon Ace, RN Outcome: Progressing Goal: Respiratory complications will improve 06/18/2019 1102 by Shanon Ace, RN Outcome: Adequate for Discharge 06/18/2019 0722 by Shanon Ace, RN Outcome: Progressing   Problem: Activity: Goal: Risk for activity intolerance will decrease 06/18/2019 1102 by Shanon Ace, RN Outcome: Adequate for Discharge 06/18/2019 0722 by Shanon Ace, RN Outcome: Progressing   Problem: Coping: Goal: Level of anxiety will decrease 06/18/2019 1102 by Shanon Ace, RN Outcome: Adequate for Discharge 06/18/2019 0722 by Shanon Ace, RN Outcome: Progressing   Problem: Pain Managment: Goal: General experience of comfort will improve 06/18/2019 1102 by Shanon Ace, RN Outcome: Adequate for Discharge 06/18/2019 0722 by Shanon Ace, RN Outcome: Progressing   Problem: Safety: Goal: Ability to remain free from injury will improve 06/18/2019 1102 by Shanon Ace, RN Outcome: Adequate for Discharge 06/18/2019 0722 by Shanon Ace, RN Outcome: Progressing   Problem: Skin Integrity: Goal: Risk for impaired skin integrity will decrease 06/18/2019 1102 by Shanon Ace, RN Outcome: Adequate for Discharge 06/18/2019 0722 by Shanon Ace, RN Outcome: Progressing

## 2019-06-18 NOTE — Discharge Instructions (Signed)
COVID-19 COVID-19 is a respiratory infection that is caused by a virus called severe acute respiratory syndrome coronavirus 2 (SARS-CoV-2). The disease is also known as coronavirus disease or novel coronavirus. In some people, the virus may not cause any symptoms. In others, it may cause a serious infection. The infection can get worse quickly and can lead to complications, such as:  Pneumonia, or infection of the lungs.  Acute respiratory distress syndrome or ARDS. This is fluid build-up in the lungs.  Acute respiratory failure. This is a condition in which there is not enough oxygen passing from the lungs to the body.  Sepsis or septic shock. This is a serious bodily reaction to an infection.  Blood clotting problems.  Secondary infections due to bacteria or fungus. The virus that causes COVID-19 is contagious. This means that it can spread from person to person through droplets from coughs and sneezes (respiratory secretions). What are the causes? This illness is caused by a virus. You may catch the virus by:  Breathing in droplets from an infected person's cough or sneeze.  Touching something, like a table or a doorknob, that was exposed to the virus (contaminated) and then touching your mouth, nose, or eyes. What increases the risk? Risk for infection You are more likely to be infected with this virus if you:  Live in or travel to an area with a COVID-19 outbreak.  Come in contact with a sick person who recently traveled to an area with a COVID-19 outbreak.  Provide care for or live with a person who is infected with COVID-19. Risk for serious illness You are more likely to become seriously ill from the virus if you:  Are 66 years of age or older.  Have a long-term disease that lowers your body's ability to fight infection (immunocompromised).  Live in a nursing home or long-term care facility.  Have a long-term (chronic) disease such as: ? Chronic lung disease,  including chronic obstructive pulmonary disease or asthma ? Heart disease. ? Diabetes. ? Chronic kidney disease. ? Liver disease.  Are obese. What are the signs or symptoms? Symptoms of this condition can range from mild to severe. Symptoms may appear any time from 2 to 14 days after being exposed to the virus. They include:  A fever.  A cough.  Difficulty breathing.  Chills.  Muscle pains.  A sore throat.  Loss of taste or smell. Some people may also have stomach problems, such as nausea, vomiting, or diarrhea. Other people may not have any symptoms of COVID-19. How is this diagnosed? This condition may be diagnosed based on:  Your signs and symptoms, especially if: ? You live in an area with a COVID-19 outbreak. ? You recently traveled to or from an area where the virus is common. ? You provide care for or live with a person who was diagnosed with COVID-19.  A physical exam.  Lab tests, which may include: ? A nasal swab to take a sample of fluid from your nose. ? A throat swab to take a sample of fluid from your throat. ? A sample of mucus from your lungs (sputum). ? Blood tests.  Imaging tests, which may include, X-rays, CT scan, or ultrasound. How is this treated? At present, there is no medicine to treat COVID-19. Medicines that treat other diseases are being used on a trial basis to see if they are effective against COVID-19. Your health care provider will talk with you about ways to treat your symptoms. For  most people, the infection is mild and can be managed at home with rest, fluids, and over-the-counter medicines. Treatment for a serious infection usually takes places in a hospital intensive care unit (ICU). It may include one or more of the following treatments. These treatments are given until your symptoms improve.  Receiving fluids and medicines through an IV.  Supplemental oxygen. Extra oxygen is given through a tube in the nose, a face mask, or a  hood.  Positioning you to lie on your stomach (prone position). This makes it easier for oxygen to get into the lungs.  Continuous positive airway pressure (CPAP) or bi-level positive airway pressure (BPAP) machine. This treatment uses mild air pressure to keep the airways open. A tube that is connected to a motor delivers oxygen to the body.  Ventilator. This treatment moves air into and out of the lungs by using a tube that is placed in your windpipe.  Tracheostomy. This is a procedure to create a hole in the neck so that a breathing tube can be inserted.  Extracorporeal membrane oxygenation (ECMO). This procedure gives the lungs a chance to recover by taking over the functions of the heart and lungs. It supplies oxygen to the body and removes carbon dioxide. Follow these instructions at home: Lifestyle  If you are sick, stay home except to get medical care. Your health care provider will tell you how long to stay home. Call your health care provider before you go for medical care.  Rest at home as told by your health care provider.  Do not use any products that contain nicotine or tobacco, such as cigarettes, e-cigarettes, and chewing tobacco. If you need help quitting, ask your health care provider.  Return to your normal activities as told by your health care provider. Ask your health care provider what activities are safe for you. General instructions  Take over-the-counter and prescription medicines only as told by your health care provider.  Drink enough fluid to keep your urine pale yellow.  Keep all follow-up visits as told by your health care provider. This is important. How is this prevented?  There is no vaccine to help prevent COVID-19 infection. However, there are steps you can take to protect yourself and others from this virus. To protect yourself:   Do not travel to areas where COVID-19 is a risk. The areas where COVID-19 is reported change often. To identify  high-risk areas and travel restrictions, check the CDC travel website: FatFares.com.br  If you live in, or must travel to, an area where COVID-19 is a risk, take precautions to avoid infection. ? Stay away from people who are sick. ? Wash your hands often with soap and water for 20 seconds. If soap and water are not available, use an alcohol-based hand sanitizer. ? Avoid touching your mouth, face, eyes, or nose. ? Avoid going out in public, follow guidance from your state and local health authorities. ? If you must go out in public, wear a cloth face covering or face mask. ? Disinfect objects and surfaces that are frequently touched every day. This may include:  Counters and tables.  Doorknobs and light switches.  Sinks and faucets.  Electronics, such as phones, remote controls, keyboards, computers, and tablets. To protect others: If you have symptoms of COVID-19, take steps to prevent the virus from spreading to others.  If you think you have a COVID-19 infection, contact your health care provider right away. Tell your health care team that you think you  may have a COVID-19 infection.  Stay home. Leave your house only to seek medical care. Do not use public transport.  Do not travel while you are sick.  Wash your hands often with soap and water for 20 seconds. If soap and water are not available, use alcohol-based hand sanitizer.  Stay away from other members of your household. Let healthy household members care for children and pets, if possible. If you have to care for children or pets, wash your hands often and wear a mask. If possible, stay in your own room, separate from others. Use a different bathroom.  Make sure that all people in your household wash their hands well and often.  Cough or sneeze into a tissue or your sleeve or elbow. Do not cough or sneeze into your hand or into the air.  Wear a cloth face covering or face mask. Where to find more  information  Centers for Disease Control and Prevention: PurpleGadgets.be  World Health Organization: https://www.castaneda.info/ Contact a health care provider if:  You live in or have traveled to an area where COVID-19 is a risk and you have symptoms of the infection.  You have had contact with someone who has COVID-19 and you have symptoms of the infection. Get help right away if:  You have trouble breathing.  You have pain or pressure in your chest.  You have confusion.  You have bluish lips and fingernails.  You have difficulty waking from sleep.  You have symptoms that get worse. These symptoms may represent a serious problem that is an emergency. Do not wait to see if the symptoms will go away. Get medical help right away. Call your local emergency services (911 in the [place] Do not drive yourself to the hospital. Let the emergency medical personnel know if you think you have COVID-19. Summary  COVID-19 is a respiratory infection that is caused by a virus. It is also known as coronavirus disease or novel coronavirus. It can cause serious infections, such as pneumonia, acute respiratory distress syndrome, acute respiratory failure, or sepsis.  The virus that causes COVID-19 is contagious. This means that it can spread from person to person through droplets from coughs and sneezes.  You are more likely to develop a serious illness if you are 31 years of age or older, have a weak immunity, live in a nursing home, or have chronic disease.  There is no medicine to treat COVID-19. Your health care provider will talk with you about ways to treat your symptoms.  Take steps to protect yourself and others from infection. Wash your hands often and disinfect objects and surfaces that are frequently touched every day. Stay away from people who are sick and wear a mask if you are sick. This information is not intended to replace advice given to you by  your health care provider. Make sure you discuss any questions you have with your health care provider. Document Released: 11/27/2018 Document Revised: 03/19/2019 Document Reviewed: 11/27/2018 Elsevier Patient Education  2020 [place]: How to Protect Yourself and Others Know how it spreads  There is currently no vaccine to prevent coronavirus disease 2019 (COVID-19).  The best way to prevent illness is to avoid being exposed to this virus.  The virus is thought to spread mainly from person-to-person. ? Between people who are in close contact with one another (within about 6 feet). ? Through respiratory droplets produced when an infected person coughs, sneezes or talks. ? These droplets can land  in the mouths or noses of people who are nearby or possibly be inhaled into the lungs. ? Some recent studies have suggested that COVID-19 may be spread by people who are not showing symptoms. Everyone should Clean your hands often  Wash your hands often with soap and water for at least 20 seconds especially after you have been in a public place, or after blowing your nose, coughing, or sneezing.  If soap and water are not readily available, use a hand sanitizer that contains at least 60% alcohol. Cover all surfaces of your hands and rub them together until they feel dry.  Avoid touching your eyes, nose, and mouth with unwashed hands. Avoid close contact  Stay home if you are sick.  Avoid close contact with people who are sick.  Put distance between yourself and other people. ? Remember that some people without symptoms may be able to spread virus. ? This is especially important for people who are at higher risk of getting very GainPain.com.cy Cover your mouth and nose with a cloth face cover when around others  You could spread COVID-19 to others even if you do not feel sick.  Everyone should wear a  cloth face cover when they have to go out in public, for example to the grocery store or to pick up other necessities. ? Cloth face coverings should not be placed on young children under age 52, anyone who has trouble breathing, or is unconscious, incapacitated or otherwise unable to remove the mask without assistance.  The cloth face cover is meant to protect other people in case you are infected.  Do NOT use a facemask meant for a Dietitian.  Continue to keep about 6 feet between yourself and others. The cloth face cover is not a substitute for social distancing. Cover coughs and sneezes  If you are in a private setting and do not have on your cloth face covering, remember to always cover your mouth and nose with a tissue when you cough or sneeze or use the inside of your elbow.  Throw used tissues in the trash.  Immediately wash your hands with soap and water for at least 20 seconds. If soap and water are not readily available, clean your hands with a hand sanitizer that contains at least 60% alcohol. Clean and disinfect  Clean AND disinfect frequently touched surfaces daily. This includes tables, doorknobs, light switches, countertops, handles, desks, phones, keyboards, toilets, faucets, and sinks. RackRewards.fr  If surfaces are dirty, clean them: Use detergent or soap and water prior to disinfection.  Then, use a household disinfectant. You can see a list of EPA-registered household disinfectants here. michellinders.com 03/10/2019 This information is not intended to replace advice given to you by your health care provider. Make sure you discuss any questions you have with your health care provider. Document Released: 02/17/2019 Document Revised: 03/18/2019 Document Reviewed: 02/17/2019 Elsevier Patient Education  2020 [place] Under Monitoring Name: Brenda [name]  Location: [place] [address]   Record here the list of visitors to your home since you became ill with respiratory symptoms that led you to consult a health provider:  Visitor Name Date Time In Time Out Did this person come within 6 feet of you? Indicate Y or N Relationship to Person Under Monitoring Phone number Comments   ___/____/____ __:__ AM/PM __:__ AM/PM       ___/____/____ __:__ AM/PM __:__ AM/PM       ___/____/____ __:__ AM/PM  __:__ AM/PM       ___/____/____ __:__ AM/PM __:__ AM/PM       ___/____/____ __:__ AM/PM __:__ AM/PM       ___/____/____ __:__ AM/PM __:__ AM/PM       ___/____/____ __:__ AM/PM __:__ AM/PM       ___/____/____ __:__ AM/PM __:__ AM/PM       ___/____/____ __:__ AM/PM __:__ AM/PM       ___/____/____ __:__ AM/PM __:__ AM/PM       ___/____/____ __:__ AM/PM __:__ AM/PM       ___/____/____ __:__ AM/PM __:__ AM/PM       ___/____/____ __:__ AM/PM __:__ AM/PM       ___/____/____ __:__ AM/PM __:__ AM/PM       Marice Potter, Division of Public Health, Communicable Disease Branch   Person Under Monitoring Name: Brenda [name]  Location: [address] [place] [address]   Infection Prevention Recommendations for Individuals Confirmed to have, or Being Evaluated for, 2019 Novel Coronavirus (COVID-19) Infection Who Receive Care at Home  Individuals who are confirmed to have, or are being evaluated for, COVID-19 should follow the prevention steps below until a healthcare provider or local or state health department says they can return to normal activities.  Stay home except to get medical care You should restrict activities outside your home, except for getting medical care. Do not go to work, school, or public areas, and do not use public transportation or taxis.  Call ahead before visiting your doctor Before your medical appointment, call the healthcare provider and tell them that you have, or are being evaluated for, COVID-19 infection. This  will help the healthcare providers office take steps to keep other people from getting infected. Ask your healthcare provider to call the local or state health department.  Monitor your symptoms Seek prompt medical attention if your illness is worsening (e.g., difficulty breathing). Before going to your medical appointment, call the healthcare provider and tell them that you have, or are being evaluated for, COVID-19 infection. Ask your healthcare provider to call the local or state health department.  Wear a facemask You should wear a facemask that covers your nose and mouth when you are in the same room with other people and when you visit a healthcare provider. People who live with or visit you should also wear a facemask while they are in the same room with you.  Separate yourself from other people in your home As much as possible, you should stay in a different room from other people in your home. Also, you should use a separate bathroom, if available.  Avoid sharing household items You should not share dishes, drinking glasses, cups, eating utensils, towels, bedding, or other items with other people in your home. After using these items, you should wash them thoroughly with soap and water.  Cover your coughs and sneezes Cover your mouth and nose with a tissue when you cough or sneeze, or you can cough or sneeze into your sleeve. Throw used tissues in a lined trash can, and immediately wash your hands with soap and water for at least 20 seconds or use an alcohol-based hand rub.  Wash your [facility] your hands often and thoroughly with soap and water for at least 20 seconds. You can use an alcohol-based hand sanitizer if soap and water are not available and if your hands are not visibly dirty. Avoid touching your eyes, nose, and mouth with unwashed hands.   Prevention Steps  for Caregivers and Household Members of Individuals Confirmed to have, or Being Evaluated for, COVID-19  Infection Being Cared for in the Home  If you live with, or provide care at home for, a person confirmed to have, or being evaluated for, COVID-19 infection please follow these guidelines to prevent infection:  Follow healthcare providers instructions Make sure that you understand and can help the patient follow any healthcare provider instructions for all care.  Provide for the patients basic needs You should help the patient with basic needs in the home and provide support for getting groceries, prescriptions, and other personal needs.  Monitor the patients symptoms If they are getting sicker, call his or her medical provider and tell them that the patient has, or is being evaluated for, COVID-19 infection. This will help the healthcare providers office take steps to keep other people from getting infected. Ask the healthcare provider to call the local or state health department.  Limit the number of people who have contact with the patient  If possible, have only one caregiver for the patient.  Other household members should stay in another home or place of residence. If this is not possible, they should stay  in another room, or be separated from the patient as much as possible. Use a separate bathroom, if available.  Restrict visitors who do not have an essential need to be in the home.  Keep older adults, very young children, and other sick people away from the patient Keep older adults, very young children, and those who have compromised immune systems or chronic health conditions away from the patient. This includes people with chronic heart, lung, or kidney conditions, diabetes, and cancer.  Ensure good ventilation Make sure that shared spaces in the home have good air flow, such as from an air conditioner or an opened window, weather permitting.  Wash your hands often  Wash your hands often and thoroughly with soap and water for at least 20 seconds. You can use an  alcohol based hand sanitizer if soap and water are not available and if your hands are not visibly dirty.  Avoid touching your eyes, nose, and mouth with unwashed hands.  Use disposable paper towels to dry your hands. If not available, use dedicated cloth towels and replace them when they become wet.  Wear a facemask and gloves  Wear a disposable facemask at all times in the room and gloves when you touch or have contact with the patients blood, body fluids, and/or secretions or excretions, such as sweat, saliva, sputum, nasal mucus, vomit, urine, or feces.  Ensure the mask fits over your nose and mouth tightly, and do not touch it during use.  Throw out disposable facemasks and gloves after using them. Do not reuse.  Wash your hands immediately after removing your facemask and gloves.  If your personal clothing becomes contaminated, carefully remove clothing and launder. Wash your hands after handling contaminated clothing.  Place all used disposable facemasks, gloves, and other waste in a lined container before disposing them with other household waste.  Remove gloves and wash your hands immediately after handling these items.  Do not share dishes, glasses, or other household items with the patient  Avoid sharing household items. You should not share dishes, drinking glasses, cups, eating utensils, towels, bedding, or other items with a patient who is confirmed to have, or being evaluated for, COVID-19 infection.  After the person uses these items, you should wash them thoroughly with soap and water.  Wash laundry thoroughly  Immediately remove and wash clothes or bedding that have blood, body fluids, and/or secretions or excretions, such as sweat, saliva, sputum, nasal mucus, vomit, urine, or feces, on them.  Wear gloves when handling laundry from the patient.  Read and follow directions on labels of laundry or clothing items and detergent. In general, wash and dry with the  warmest temperatures recommended on the label.  Clean all areas the individual has used often  Clean all touchable surfaces, such as counters, tabletops, doorknobs, bathroom fixtures, toilets, phones, keyboards, tablets, and bedside tables, every day. Also, clean any surfaces that may have blood, body fluids, and/or secretions or excretions on them.  Wear gloves when cleaning surfaces the patient has come in contact with.  Use a diluted bleach solution (e.g., dilute bleach with 1 part bleach and 10 parts water) or a household disinfectant with a label that says EPA-registered for coronaviruses. To make a bleach solution at home, add 1 tablespoon of bleach to 1 quart (4 cups) of water. For a larger supply, add  cup of bleach to 1 gallon (16 cups) of water.  Read labels of cleaning products and follow recommendations provided on product labels. Labels contain instructions for safe and effective use of the cleaning product including precautions you should take when applying the product, such as wearing gloves or eye protection and making sure you have good ventilation during use of the product.  Remove gloves and wash hands immediately after cleaning.  Monitor yourself for signs and symptoms of illness Caregivers and household members are considered close contacts, should monitor their health, and will be asked to limit movement outside of the home to the extent possible. Follow the monitoring steps for close contacts listed on the symptom monitoring form.   ? If you have additional questions, contact your local health department or call the epidemiologist on call at [phone] (available 24/7). ? This guidance is subject to change. For the most up-to-date guidance from [facility], please refer to their website: YouBlogs.pl

## 2019-06-19 ENCOUNTER — Telehealth: Payer: Self-pay

## 2019-06-19 NOTE — Telephone Encounter (Signed)
Transition Care Management Follow-up Telephone Call  Admission date:  06/13/2019   Discharge Date:  06/18/2019  Discharge Diagnosis  Hypoxia, Community acquired pneumonia,COVID-19  Active Problems: Pneumonia due to COVID-19 virus   How have you been since you were released from the hospital? "i'm just tired"   Do you understand why you were in the hospital? yes   Do you understand the discharge instructions? yes   Where were you discharged to? Home. Family lives "at other end of the house". Husband currently admitted with COVID.    Items Reviewed:  Medications reviewed: yes  Allergies reviewed: yes  Dietary changes reviewed: yes  Referrals reviewed: yes   Functional Questionnaire:   Activities of Daily Living (ADLs):   She states they are independent in the following: ambulation, bathing and hygiene, feeding, continence, grooming, toileting and dressing States they require assistance with the following: None.    Any transportation issues/concerns?: no   Any patient concerns? no   Confirmed importance and date/time of follow-up visits scheduled yes  Provider Appointment booked with PCP Monday, 06/22/2019 via [facility] virtual visit.   Confirmed with patient if condition begins to worsen call PCP or go to the ER.  Patient was given the office number and encouraged to call back with question or concerns.  : yes

## 2019-06-21 NOTE — Telephone Encounter (Signed)
Noted: nurse phone contact with patient for TCM. Signed:  Crissie Sickles, MD           06/21/2019

## 2019-06-22 ENCOUNTER — Ambulatory Visit (INDEPENDENT_AMBULATORY_CARE_PROVIDER_SITE_OTHER): Payer: Medicare Other | Admitting: Family Medicine

## 2019-06-22 ENCOUNTER — Encounter: Payer: Self-pay | Admitting: Family Medicine

## 2019-06-22 ENCOUNTER — Other Ambulatory Visit: Payer: Self-pay

## 2019-06-22 VITALS — BP 125/88 | HR 82 | Temp 98.3°F | Wt 186.5 lb

## 2019-06-22 DIAGNOSIS — M170 Bilateral primary osteoarthritis of knee: Secondary | ICD-10-CM

## 2019-06-22 DIAGNOSIS — R7303 Prediabetes: Secondary | ICD-10-CM | POA: Diagnosis not present

## 2019-06-22 DIAGNOSIS — G894 Chronic pain syndrome: Secondary | ICD-10-CM

## 2019-06-22 DIAGNOSIS — Z8709 Personal history of other diseases of the respiratory system: Secondary | ICD-10-CM

## 2019-06-22 DIAGNOSIS — U071 COVID-19: Secondary | ICD-10-CM

## 2019-06-22 MED ORDER — OXYCODONE HCL 5 MG PO TABS: ORAL_TABLET | ORAL | 0 refills | Status: DC

## 2019-06-22 NOTE — Progress Notes (Signed)
Virtual Visit via Video Note  I connected with pt on 06/22/19 at  1:00 PM EDT by a video enabled telemedicine application and verified that I am speaking with the correct person using two identifiers.  Location patient: home Location provider:work or home office Persons participating in the virtual visit: patient, provider  I discussed the limitations of evaluation and management by telemedicine and the availability of in person appointments. The patient expressed understanding and agreed to proceed.  Telemedicine visit is a necessity given the COVID-19 restrictions in place at the current time.  HPI:  06/22/2019  CC:  Chief Complaint  Patient presents with  . Hospitalization Follow-up    8/8-8/13 for COVID. patient is tired. still has a cough. doesnt have SOB or a fever. 7/28 was tested for COVID.     Patient is a 63 y.o. Caucasian female who presents for  hospital follow up, specifically [facility] face-to-face visit.  Also f/u chronic pain syndrome. Dates hospitalized: 8/8-8/13, 2020. Days since d/c from hospital: 4 days Patient was discharged from hospital to home. Reason for admission to hospital: covid 19 pneumonia with acute hypoxic RF. Date of interactive (phone) contact with patient and/or caregiver: 06/19/19.  I have reviewed patient's discharge summary plus pertinent specific notes, labs, and imaging from the hospitalization.   Fever, cough, SOB, found to be hypoxic at home and went via EMS to ED where CXR showed diffuse bilat interstitial opacities and her inflammatory markers were elevated. IV steroids in ED, admitted and put on remdesivir.  Tocilizumab was also given. Oxygen support was weened to RA. She had some hyperglycemia from the steroids and acute stress rxn. Upon d/c she was finished with systemic steroids and all inpt treatment meds, was on RA, and was told to take albut q4h prn and resume all home meds.  Bld cultures NO GROWTH x  5d.   Improving gradually, still some fatigue, having bad GERD from vit C and zinc so her PO intake has not been ideal. Not on oxygen.   Not requiring albuterol nebs since going home.  No fevers.  SOB gone.  Indication for chronic opioid: chronic bilat LBP w/out sciatica, and chronic pain from bilat osteoarthritis knees. Medication and dose: oxycodone 5mg , 1-2 tid prn. # pills per month: #180 Last UDS date: 12/17/2018 (approp results) Opioid Treatment Agreement signed (Y/N): Y, 12/17/2018 Opioid Treatment Agreement last reviewed with patient:  today Gary reviewed this encounter (include red flags):  Y, no red flags.  Taking 1-2 oxycodone tid avg.  Pain controlled well with this.  No adverse side effects.  Medication reconciliation was done today and patient is taking meds as recommended by discharging hospitalist/specialist.    PMH:  Past Medical History:  Diagnosis Date  . Aortic valve stenosis    "very mild"  . Atypical chest pain 2007; 2015   cardiolyte neg, echo nl, cath showed mild/nonobstructive LAD disease  . Bilateral primary osteoarthritis of knee 06/30/2012   Right >L.  Right unicompartmental knee arthroplasty 10/25/18  . CAD (coronary artery disease)   . Chronic LBP    Multiple surgeries  . COPD (chronic obstructive pulmonary disease) ([place])   . COVID-19 virus infection 06/02/2019   Eval at [facility] ED and d/c'd home.  Admitted 06/12/19 with hypoxic RF and bilat infiltrates.  . DDD (degenerative disc disease)    spinal stenosis  . Fatty liver   . History of GI bleed    NSAIDS  . Hyperlipidemia    mixed  .  Microscopic hematuria    full w/ u unrevealing X 2  . Normal nuclear stress test 11/11 and 06/2014   negative, EF normal  . Palpitations 2006   48H holter neg  . PONV (postoperative nausea and vomiting)    "never threw up but felt sick on my stomach", also believes that she was aware of surgery during her hysterectomy   . Pre-diabetes   . Prediabetes    Highest  A1c 6.1% as of 03/2017  . RBBB (right bundle branch block)   . Recurrent UTI   . Tobacco dependence in remission    Quit fall 2015.  Only occasional bronchitis illness (CT shest 2009 nl- for f/u of ? nodule on CXR)    PSH:  Past Surgical History:  Procedure Laterality Date  . ABDOMINAL HYSTERECTOMY  1997   DUB  . APPENDECTOMY  1984  . CARDIAC CATHETERIZATION  10/09/2005   no CAD, mildly elevated LVEDP, normal LV function (Dr. Gerrie Nordmann)  . CARDIAC CATHETERIZATION N/A 05/16/2015   Mild non-obstructive CAD--med mgmt.  Procedure: Left Heart Cath and Coronary Angiography;  Surgeon: Pixie Casino, MD;  Location: [place] [facility] LAB;  Service: Cardiovascular;  Laterality: N/A;  . CARDIOVASCULAR STRESS TEST  06/2014   normal lexiscan NST  . CARDIOVASCULAR STRESS TEST  2006   persantine - no ischemia, low risk   . KNEE ARTHROSCOPY Bilateral   . left wrist ganglion cyst excision  2010  . [place]   right iliac crest bone graft+metal instrumentation; 2005 metal removal and decompression, 2011 lumbar decompression 4-11, then stabilization/ instrumentation done 09-19-10: L2,L3, L5 left and L2 , L3, L4 right pedical remnant L4 left embedded. Left iliac crest bone graft-- Dr Velna Ochs  . LUMBAR SPINE SURGERY  02/10/2016   Dr. Velna Ochs: lumbar decompression, instrumentation removal, and fusion exploration--HELPED her a lot, esp her radicular leg pains.  . OOPHORECTOMY Right    cyst  . PARTIAL KNEE ARTHROPLASTY Right 11/04/2018   Procedure: UNICOMPARTMENTAL KNEE;  Surgeon: Renette Butters, MD;  Location: [facility];  Service: Orthopedics;  Laterality: Right;  Adductor Block  . TONSILLECTOMY  63 yrs old  . TONSILLECTOMY    . TRANSTHORACIC ECHOCARDIOGRAM  2006   EF=>55%, trace MR, mild TR, trace AV regurg, trace pulm valve regurg     MEDS:  Outpatient Medications Prior to Visit  Medication Sig Dispense Refill  . acetaminophen (TYLENOL) 500 MG tablet every 6 (six) hours as needed for mild  pain or headache.    . albuterol (PROVENTIL HFA;VENTOLIN HFA) 108 (90 Base) MCG/ACT inhaler Inhale 2 puffs into the lungs every 4 (four) hours as needed for wheezing or shortness of breath (bronchitis). Reported on 03/12/2016 1 Inhaler 2  . clopidogrel (PLAVIX) 75 MG tablet Take 1 tablet (75 mg total) by mouth daily. 90 tablet 3  . cyclobenzaprine (FLEXERIL) 10 MG tablet take 1 tablet by mouth every 8 hours if needed (Patient taking differently: Take 10 mg by mouth 3 (three) times daily as needed for muscle spasms. ) 30 tablet 6  . dicyclomine (BENTYL) 10 MG capsule Take 1 capsule (10 mg total) by mouth 4 (four) times daily -  before meals and at bedtime. 30 capsule 0  . fenofibrate 160 MG tablet take 1 tablet by mouth once daily with food (Patient taking differently: Take 160 mg by mouth daily. ) 90 tablet 3  . LORazepam (ATIVAN) 1 MG tablet Take 1 tablet (1 mg total) by mouth 2 (two)  times daily. 60 tablet 5  . nitroGLYCERIN (NITROSTAT) 0.4 MG SL tablet Place 1 tablet (0.4 mg total) under the tongue every 5 (five) minutes as needed for chest pain (MAX 3 doses.). 25 tablet 3  . oxyCODONE (OXY IR/ROXICODONE) 5 MG immediate release tablet 1-2 po tid prn pain 180 tablet 0  . pantoprazole (PROTONIX) 40 MG tablet Take 1 tablet (40 mg total) by mouth daily. 90 tablet 3  . rosuvastatin (CRESTOR) 40 MG tablet Take 1 tablet (40 mg total) by mouth daily. 90 tablet 3  . sodium chloride (OCEAN) 0.65 % SOLN nasal spray Place 1 spray into both nostrils as needed for congestion.    . vitamin C (VITAMIN C) 500 MG tablet Take 1 tablet (500 mg total) by mouth daily for 10 days. 10 tablet 0  . zinc sulfate 220 (50 Zn) MG capsule Take 1 capsule (220 mg total) by mouth daily for 10 days.    . hydrocortisone (ANUSOL-HC) 25 MG suppository Place 1 suppository (25 mg total) rectally 2 (two) times daily. (Patient not taking: Reported on 06/22/2019) 6 suppository 0   No facility-administered medications prior to visit.     EXAM: BP 125/88   Pulse 82   Temp 98.3 F (36.8 C) (Oral)   Wt 186 lb 8 oz (84.6 kg)   SpO2 95%   BMI 32.01 kg/m   GENERAL: alert, oriented, appears well and in no acute distress  HEENT: atraumatic, conjunttiva clear, no obvious abnormalities on inspection of external nose and ears  NECK: normal movements of the head and neck  LUNGS: on inspection no signs of respiratory distress, breathing rate appears normal, no obvious gross SOB, gasping or wheezing  CV: no obvious cyanosis  MS: moves all visible extremities without noticeable abnormality  PSYCH/NEURO: pleasant and cooperative, no obvious depression or anxiety, speech and thought processing grossly intact   Pertinent labs/imaging  Lab Results  Component Value Date   HGBA1C 6.4 (H) 06/14/2019      Chemistry      Component Value Date/Time   NA 142 06/18/2019 0230   NA 142 06/18/2019   K 4.0 06/18/2019 0230   CL 101 06/18/2019 0230   CO2 26 06/18/2019 0230   BUN 22 06/18/2019 0230   BUN 26 (A) 06/18/2019   CREATININE 0.68 06/18/2019 0230   CREATININE 0.69 06/29/2011 1607      Component Value Date/Time   CALCIUM 9.1 06/18/2019 0230   ALKPHOS 45 06/18/2019 0230   AST 14 (L) 06/18/2019 0230   ALT 23 06/18/2019 0230   BILITOT 0.4 06/18/2019 0230     Lab Results  Component Value Date   WBC 13.1 (H) 06/18/2019   HGB 13.0 06/18/2019   HCT 40.1 06/18/2019   MCV 89.9 06/18/2019   PLT 331 06/18/2019   DG chest portable on 06/13/19: EXAM: PORTABLE CHEST 1 VIEW  COMPARISON:  06/02/2019  FINDINGS: Midline trachea. Normal heart size. No pleural effusion or pneumothorax. Development of pulmonary interstitial prominence with interstitial opacities bilaterally. No well-defined lobar consolidation.  IMPRESSION: Development of relatively diffuse interstitial opacities which, given the clinical history are suspicious for atypical/viral pneumonia. In the appropriate clinical setting, mild pulmonary venous  congestion could look similar.  ASSESSMENT/PLAN:  1) COVID 19 virus pneumonia, ARDS: now back to baseline health except some lingering fatigue. She can stop zinc and vit C b/c this causing signif GI upset.  2) Chronic pain syndrome: sciatica and bilat knee osteoarthritis. The current medical regimen is effective;  continue  present plan and medications. I did electronic rx's for oxycodone 5mg , 1-2 bid prn, #180 for this month, Sept, and Oct 2020.  Appropriate fill on/after date was noted on each rx.  3) Prediabetes: her highest A1c prior to this month was 6.1%. Plan repeat A1c in 3 mo, start med if 6.5% or greater.  Medical decision making of moderate complexity was utilized today.  FOLLOW UP:  3 mo chronic pain syndrome and repeat A1c (prediabetes).  Signed:  Crissie Sickles, MD           06/22/2019

## 2019-07-09 ENCOUNTER — Other Ambulatory Visit: Payer: Self-pay

## 2019-07-09 DIAGNOSIS — Z20822 Contact with and (suspected) exposure to covid-19: Secondary | ICD-10-CM

## 2019-07-09 DIAGNOSIS — R6889 Other general symptoms and signs: Secondary | ICD-10-CM | POA: Diagnosis not present

## 2019-07-11 ENCOUNTER — Other Ambulatory Visit: Payer: Self-pay | Admitting: Family Medicine

## 2019-07-11 LAB — NOVEL CORONAVIRUS, NAA: SARS-CoV-2, NAA: NOT DETECTED

## 2019-09-23 ENCOUNTER — Other Ambulatory Visit: Payer: Self-pay

## 2019-09-24 ENCOUNTER — Ambulatory Visit (INDEPENDENT_AMBULATORY_CARE_PROVIDER_SITE_OTHER): Payer: Medicare Other | Admitting: Family Medicine

## 2019-09-24 ENCOUNTER — Encounter: Payer: Self-pay | Admitting: Family Medicine

## 2019-09-24 VITALS — BP 160/90 | HR 94 | Temp 98.0°F | Resp 16 | Ht 64.0 in | Wt 192.6 lb

## 2019-09-24 DIAGNOSIS — R7303 Prediabetes: Secondary | ICD-10-CM

## 2019-09-24 DIAGNOSIS — M19049 Primary osteoarthritis, unspecified hand: Secondary | ICD-10-CM

## 2019-09-24 DIAGNOSIS — Z8616 Personal history of COVID-19: Secondary | ICD-10-CM

## 2019-09-24 DIAGNOSIS — Z23 Encounter for immunization: Secondary | ICD-10-CM | POA: Diagnosis not present

## 2019-09-24 DIAGNOSIS — R030 Elevated blood-pressure reading, without diagnosis of hypertension: Secondary | ICD-10-CM

## 2019-09-24 DIAGNOSIS — M2550 Pain in unspecified joint: Secondary | ICD-10-CM

## 2019-09-24 DIAGNOSIS — G894 Chronic pain syndrome: Secondary | ICD-10-CM | POA: Diagnosis not present

## 2019-09-24 DIAGNOSIS — Z8619 Personal history of other infectious and parasitic diseases: Secondary | ICD-10-CM

## 2019-09-24 DIAGNOSIS — M79642 Pain in left hand: Secondary | ICD-10-CM

## 2019-09-24 DIAGNOSIS — M79641 Pain in right hand: Secondary | ICD-10-CM

## 2019-09-24 DIAGNOSIS — M791 Myalgia, unspecified site: Secondary | ICD-10-CM | POA: Diagnosis not present

## 2019-09-24 DIAGNOSIS — E7800 Pure hypercholesterolemia, unspecified: Secondary | ICD-10-CM | POA: Diagnosis not present

## 2019-09-24 MED ORDER — OXYCODONE HCL 5 MG PO TABS: ORAL_TABLET | ORAL | 0 refills | Status: DC

## 2019-09-24 MED ORDER — ALBUTEROL SULFATE HFA 108 (90 BASE) MCG/ACT IN AERS: 2.0000 | INHALATION_SPRAY | RESPIRATORY_TRACT | 1 refills | Status: DC | PRN

## 2019-09-24 MED ORDER — PANTOPRAZOLE SODIUM 40 MG PO TBEC: 40.0000 mg | DELAYED_RELEASE_TABLET | Freq: Every day | ORAL | 3 refills | Status: DC

## 2019-09-24 MED ORDER — FENOFIBRATE 160 MG PO TABS: ORAL_TABLET | ORAL | 3 refills | Status: DC

## 2019-09-24 MED ORDER — CYCLOBENZAPRINE HCL 10 MG PO TABS: ORAL_TABLET | ORAL | 6 refills | Status: DC

## 2019-09-24 MED ORDER — NITROGLYCERIN 0.4 MG SL SUBL: 0.4000 mg | SUBLINGUAL_TABLET | SUBLINGUAL | 1 refills | Status: AC | PRN

## 2019-09-24 MED ORDER — ROSUVASTATIN CALCIUM 40 MG PO TABS: 40.0000 mg | ORAL_TABLET | Freq: Every day | ORAL | 3 refills | Status: DC

## 2019-09-24 MED ORDER — CLOPIDOGREL BISULFATE 75 MG PO TABS: 75.0000 mg | ORAL_TABLET | Freq: Every day | ORAL | 3 refills | Status: DC

## 2019-09-24 MED ORDER — LORAZEPAM 1 MG PO TABS: 1.0000 mg | ORAL_TABLET | Freq: Two times a day (BID) | ORAL | 5 refills | Status: DC

## 2019-09-24 NOTE — Progress Notes (Signed)
OFFICE VISIT  09/24/2019   CC:  Chief Complaint  Patient presents with  . Follow-up    chronic pain, pt is fasting   HPI:    Patient is a 63 y.o. Caucasian female who presents for 3 mo f/u chronic pain syndrome, GAD, and prediabetes.  Indication for chronic opioid: chronic bilat LBP w/out sciatica, and chronic pain from bilat osteoarthritis knees. Medication and dose: oxycodone 78m, 1-2 tid prn. # pills per month: #180 Last UDS date: 12/17/2018 (approp results) Opioid Treatment Agreement signed (Y/N): Y, 12/17/2018 Opioid Treatment Agreement last reviewed with patient:  today N[place]sterreviewed this encounter (include red flags):  Y, no red flags. PMP AWARE reviewed today: most recent fill of oxycodone rx was 08/22/19, #180, rx by me. Most recent lorazepam rx fill was 08/27/19, #60, rx by me.  Pain is bad.  Her usual areas plus all over both shoulders, both hips, both LL's and feet, hands and wrists. "Everywhere there is a bone, muscle, or joint".  Has always had some fingers swelling mainly in mornings/+stiff.  No other joints swollen, no redness.  No rash, no oral ulcers, no eye pain/redness.  No dysuria or hematuria.  GAD: taking loraz 187mbid most days, some days only daily. No side effects.  Blood pressure: always 130s/80s avg, nothing as high as it is here today. It is higher when she is in pain.  Prediabetes: not making any specific dietary changes but her appetite is not very good. Her chronic pain makes it impossible to exercise.  ROS: no fever, no CP, no SOB, no wheezing, no cough, no dizziness, no HAs, no rashes, no melena/hematochezia.  No polyuria or polydipsia.     Past Medical History:  Diagnosis Date  . Aortic valve stenosis    "very mild"  . Atypical chest pain 2007; 2015   cardiolyte neg, echo nl, cath showed mild/nonobstructive LAD disease  . Bilateral primary osteoarthritis of knee 06/30/2012   Right >L.  Right unicompartmental knee arthroplasty 10/25/18   . CAD (coronary artery disease)   . Chronic LBP    Multiple surgeries  . [place]  . COVID-19 virus infection 06/02/2019   [facility].  Admitted 06/12/19 with hypoxic RF and bilat infiltrates.  . DDD (degenerative disc disease)    spinal stenosis  . Fatty liver   . History of GI bleed    NSAIDS  . Hyperlipidemia    mixed  . Microscopic hematuria    full w/ u unrevealing X 2  . Normal nuclear stress test 11/11 and 06/2014   negative, EF normal  . Palpitations 2006   48H holter neg  . Prediabetes    Highest A1c 6.1% as of 03/2017  . RBBB (right bundle branch block)   . Recurrent UTI   . Tobacco dependence in remission    Quit fall 2015.      Past Surgical History:  Procedure Laterality Date  . ABDOMINAL HYSTERECTOMY  1997   DUB  . APPENDECTOMY  1984  . CARDIAC CATHETERIZATION  10/09/2005   no CAD, mildly elevated LVEDP, normal LV function (Dr. R.Gerrie Nordmann . CARDIAC CATHETERIZATION N/A 05/16/2015   Mild non-obstructive CAD--med mgmt.  Procedure: Left Heart Cath and Coronary Angiography;  Surgeon: KePixie CasinoMD;  [facility];  Service: Cardiovascular;  Laterality: N/A;  . CARDIOVASCULAR STRESS TEST  06/2014   normal lexiscan NST  . CARDIOVASCULAR STRESS TEST  2006   persantine - no ischemia, low risk   . KNEE ARTHROSCOPY Bilateral   . left wrist ganglion cyst excision  2010  . [place]   right iliac crest bone graft+metal instrumentation; 2005 metal removal and decompression, 2011 lumbar decompression 4-11, then stabilization/ instrumentation done 09-19-10: L2,L3, L5 left and L2 , L3, L4 right pedical remnant L4 left embedded. Left iliac crest bone graft-- Dr Velna Ochs  . LUMBAR SPINE SURGERY  02/10/2016   Dr. Velna Ochs: lumbar decompression, instrumentation removal, and fusion exploration--HELPED her a lot, esp her radicular leg pains.  . OOPHORECTOMY Right    cyst  . PARTIAL KNEE  ARTHROPLASTY Right 11/04/2018   Procedure: UNICOMPARTMENTAL KNEE;  Surgeon: Renette Butters, MD;  Location: [facility];  Service: Orthopedics;  Laterality: Right;  Adductor Block  . TONSILLECTOMY  63 yrs old  . TONSILLECTOMY    . TRANSTHORACIC ECHOCARDIOGRAM  2006   EF=>55%, trace MR, mild TR, trace AV regurg, trace pulm valve regurg     Outpatient Medications Prior to Visit  Medication Sig Dispense Refill  . acetaminophen (TYLENOL) 500 MG tablet every 6 (six) hours as needed for mild pain or headache.    . sodium chloride (OCEAN) 0.65 % SOLN nasal spray Place 1 spray into both nostrils as needed for congestion.    Marland Kitchen albuterol (PROVENTIL HFA;VENTOLIN HFA) 108 (90 Base) MCG/ACT inhaler Inhale 2 puffs into the lungs every 4 (four) hours as needed for wheezing or shortness of breath (bronchitis). Reported on 03/12/2016 1 Inhaler 2  . clopidogrel (PLAVIX) 75 MG tablet Take 1 tablet (75 mg total) by mouth daily. 90 tablet 3  . cyclobenzaprine (FLEXERIL) 10 MG tablet take 1 tablet by mouth every 8 hours if needed (Patient taking differently: Take 10 mg by mouth 3 (three) times daily as needed for muscle spasms. ) 30 tablet 6  . fenofibrate 160 MG tablet take 1 tablet by mouth once daily with food (Patient taking differently: Take 160 mg by mouth daily. ) 90 tablet 3  . LORazepam (ATIVAN) 1 MG tablet Take 1 tablet (1 mg total) by mouth 2 (two) times daily. 60 tablet 5  . nitroGLYCERIN (NITROSTAT) 0.4 MG SL tablet Place 1 tablet (0.4 mg total) under the tongue every 5 (five) minutes as needed for chest pain (MAX 3 doses.). 25 tablet 3  . oxyCODONE (OXY IR/ROXICODONE) 5 MG immediate release tablet 1-2 po tid prn pain 180 tablet 0  . pantoprazole (PROTONIX) 40 MG tablet Take 1 tablet (40 mg total) by mouth daily. 90 tablet 3  . rosuvastatin (CRESTOR) 40 MG tablet Take 1 tablet (40 mg total) by mouth daily. 90 tablet 3   No facility-administered medications prior to visit.     Allergies  Allergen Reactions   . Aspirin Other (See Comments)     GI Bleed  . Beta Adrenergic Blockers Other (See Comments)    REACTION: decreased libido  . Nsaids Other (See Comments)     GI Upset  . Penicillins Swelling and Other (See Comments)    DID THE REACTION INVOLVE: Swelling of the face/tongue/throat, SOB, or low BP? Yes Sudden or severe rash/hives, skin peeling, or the inside of the mouth or nose? Yes Did it require medical treatment? Yes When did it last happen? 63 years old If all above answers are "NO", may proceed with cephalosporin use.   Marland Kitchen Prochlorperazine Edisylate Other (See Comments)     Stroke like symptoms  . Sulfonamide Derivatives Other (See  Comments)    Unknown allergic reaction  . Amitriptyline Hcl Palpitations and Other (See Comments)     increased heart rate    ROS As per HPI  PE: Initial bp 160/90.  Manual repeat 10 min later 140/90. Blood pressure (!) 160/90, pulse 94, temperature 98 F (36.7 C), temperature source Temporal, resp. rate 16, height _0  (1.626 m), weight 192 lb 9.6 oz (87.4 kg), SpO2 92 %. Body mass index is 33.06 kg/m.  Gen: Alert, tired appearing.  Patient is oriented to person, place, time, and situation. AFFECT: pleasant, lucid thought and speech. UEA:VWUJ: no injection, icteris, swelling, or exudate.  EOMI, PERRLA. Mouth: lips without lesion/swelling.  Oral mucosa pink and moist. Oropharynx without erythema, exudate, or swelling.  Neck - No masses or thyromegaly or limitation in range of motion CV: RRR, no m/r/g.   LUNGS: CTA bilat, nonlabored resps, good aeration in all lung fields. ABD: soft, NT/ND EXT: no clubbing or cyanosis.  no edema.  Skin - no sores or suspicious lesions or rashes or color changes ROM of all joints intact, without erythema or warmth.  She is TTP over all parts of both hands, Remainder of body from neck down to toes is mildly tender to palpation.  LABS:  Lab Results  Component Value Date   TSH 0.807 05/10/2015   Lab Results   Component Value Date   WBC 13.1 (H) 06/18/2019   HGB 13.0 06/18/2019   HCT 40.1 06/18/2019   MCV 89.9 06/18/2019   PLT 331 06/18/2019   Lab Results  Component Value Date   CREATININE 0.68 06/18/2019   BUN 22 06/18/2019   NA 142 06/18/2019   K 4.0 06/18/2019   CL 101 06/18/2019   CO2 26 06/18/2019   Lab Results  Component Value Date   ALT 23 06/18/2019   AST 14 (L) 06/18/2019   ALKPHOS 45 06/18/2019   BILITOT 0.4 06/18/2019   Lab Results  Component Value Date   CHOL 165 06/05/2018   Lab Results  Component Value Date   HDL 54.70 06/05/2018   Lab Results  Component Value Date   LDLCALC 91 06/05/2018   Lab Results  Component Value Date   TRIG 84 06/13/2019   Lab Results  Component Value Date   CHOLHDL 3 06/05/2018   Lab Results  Component Value Date   HGBA1C 6.4 (H) 06/14/2019    IMPRESSION AND PLAN:  1) Chronic pain syndrome: this is stable but her acute/subacute pain in the rest of the body is making her miserable.  No changes in pain meds today. I did electronic rx's for oxycodone 56m, 1-2 tid prn, #180 today for nov, dec, and Jan 2021. Appropriate fill on/after date was noted on each rx. CSC and UDS UTD.  2) Elev bp w/out dx htn: stable home bp's, bp recheck today better. Pain playing a big role. No meds at this time.  3) Prediabetes: needs to work on diet.  She can't exercise due to chronic pain. A1c today, lytes/cr today.  4) Mixed hyperlipidemia: tolerating statin and fibrate--she was on these meds long before this recent musculoskeletal pain came up. FLP and hepatic panel today.  5) Acute myalgias and arthralgias. Some osteoarthritis in hands and knees. Question post-viral syndrome from covid infection about 4 mo ago. Check ESR/CRP, cbc, cmet, cpk, ANA, TSH, lyme titers, and parvo B19 ab's, uric acid. Check right hand x-ray.  An After Visit Summary was printed and given to the patient.  FOLLOW UP: Return  in about 3 months (around  12/25/2019) for routine chronic illness f/u.  Signed:  Crissie Sickles, MD           09/24/2019

## 2019-09-25 LAB — LIPID PANEL
Cholesterol: 162 mg/dL (ref 0–200)
HDL: 44.4 mg/dL (ref >39.00)
LDL Cholesterol: 93 mg/dL (ref 0–99)
NonHDL: 117.71
Total CHOL/HDL Ratio: 4
Triglycerides: 124 mg/dL (ref 0.0–149.0)
VLDL: 24.8 mg/dL (ref 0.0–40.0)

## 2019-09-25 LAB — LYME AB/WESTERN BLOT REFLEX
LYME DISEASE AB, QUANT, IGM: <0.8 index (ref 0.00–0.79)
Lyme IgG/IgM Ab: <0.91 {ISR} (ref 0.00–0.90)

## 2019-09-25 LAB — COMPREHENSIVE METABOLIC PANEL
ALT: 21 U/L (ref 0–35)
AST: 21 U/L (ref 0–37)
Albumin: 4.7 g/dL (ref 3.5–5.2)
Alkaline Phosphatase: 59 U/L (ref 39–117)
BUN: 12 mg/dL (ref 6–23)
CO2: 27 mEq/L (ref 19–32)
Calcium: 10.2 mg/dL (ref 8.4–10.5)
Chloride: 106 mEq/L (ref 96–112)
Creatinine, Ser: 0.61 mg/dL (ref 0.40–1.20)
GFR: 98.97 mL/min (ref >60.00)
Glucose, Bld: 125 mg/dL — ABNORMAL HIGH (ref 70–99)
Potassium: 4.1 mEq/L (ref 3.5–5.1)
Sodium: 144 mEq/L (ref 135–145)
Total Bilirubin: 0.8 mg/dL (ref 0.2–1.2)
Total Protein: 7.3 g/dL (ref 6.0–8.3)

## 2019-09-25 LAB — C-REACTIVE PROTEIN: CRP: <1 mg/dL (ref 0.5–20.0)

## 2019-09-25 LAB — SEDIMENTATION RATE: Sed Rate: 7 mm/hr (ref 0–30)

## 2019-09-25 LAB — URIC ACID: Uric Acid, Serum: 6.9 mg/dL (ref 2.4–7.0)

## 2019-09-25 LAB — CBC WITH DIFFERENTIAL/PLATELET
Basophils Absolute: 0.1 10*3/uL (ref 0.0–0.1)
Basophils Relative: 1.3 % (ref 0.0–3.0)
Eosinophils Absolute: 0.1 10*3/uL (ref 0.0–0.7)
Eosinophils Relative: 0.6 % (ref 0.0–5.0)
HCT: 46.4 % — ABNORMAL HIGH (ref 36.0–46.0)
Hemoglobin: 15.6 g/dL — ABNORMAL HIGH (ref 12.0–15.0)
Lymphocytes Relative: 24 % (ref 12.0–46.0)
Lymphs Abs: 2.3 10*3/uL (ref 0.7–4.0)
MCHC: 33.7 g/dL (ref 30.0–36.0)
MCV: 91 fl (ref 78.0–100.0)
Monocytes Absolute: 0.3 10*3/uL (ref 0.1–1.0)
Monocytes Relative: 3.1 % (ref 3.0–12.0)
Neutro Abs: 6.8 10*3/uL (ref 1.4–7.7)
Neutrophils Relative %: 71 % (ref 43.0–77.0)
Platelets: 244 10*3/uL (ref 150.0–400.0)
RBC: 5.1 Mil/uL (ref 3.87–5.11)
RDW: 13.2 % (ref 11.5–15.5)
WBC: 9.5 10*3/uL (ref 4.0–10.5)

## 2019-09-25 LAB — TSH: TSH: 0.42 u[IU]/mL (ref 0.35–4.50)

## 2019-09-25 LAB — HEMOGLOBIN A1C: Hgb A1c MFr Bld: 5.9 % (ref 4.6–6.5)

## 2019-09-25 LAB — CK: Total CK: 93 U/L (ref 7–177)

## 2019-09-30 ENCOUNTER — Other Ambulatory Visit: Payer: Self-pay | Admitting: Family Medicine

## 2019-09-30 ENCOUNTER — Other Ambulatory Visit: Payer: Self-pay

## 2019-09-30 ENCOUNTER — Ambulatory Visit (HOSPITAL_COMMUNITY)
Admission: RE | Admit: 2019-09-30 | Discharge: 2019-09-30 | Disposition: A | Discharge status: Home / Self Care | Payer: Medicare Other | Source: Ambulatory Visit | Attending: Family Medicine | Admitting: Family Medicine

## 2019-09-30 DIAGNOSIS — M19041 Primary osteoarthritis, right hand: Secondary | ICD-10-CM | POA: Diagnosis not present

## 2019-09-30 DIAGNOSIS — M79642 Pain in left hand: Secondary | ICD-10-CM | POA: Diagnosis not present

## 2019-09-30 DIAGNOSIS — M2550 Pain in unspecified joint: Secondary | ICD-10-CM | POA: Diagnosis not present

## 2019-09-30 DIAGNOSIS — M79641 Pain in right hand: Secondary | ICD-10-CM | POA: Diagnosis not present

## 2019-09-30 DIAGNOSIS — M19049 Primary osteoarthritis, unspecified hand: Secondary | ICD-10-CM | POA: Diagnosis not present

## 2019-09-30 DIAGNOSIS — M791 Myalgia, unspecified site: Secondary | ICD-10-CM | POA: Insufficient documentation

## 2019-09-30 LAB — ANA SCREEN,IFA,REFLEX TITER/PATTERN,REFLEX MPLX 11 AB CASCADE
14-3-3 eta Protein: <0.2 ng/mL (ref <0.2)
Anti Nuclear Antibody (ANA): NEGATIVE
Cyclic Citrullin Peptide Ab: <16 UNITS
Rheumatoid fact SerPl-aCnc: 14 IU/mL — ABNORMAL HIGH (ref <14)

## 2019-09-30 LAB — PARVOVIRUS B19 ANTIBODY, IGG AND IGM
Parvovirus B19 IgG: 2.2 — ABNORMAL HIGH (ref <0.9)
Parvovirus B19 IgM: 0.1 (ref <0.9)

## 2019-10-06 DIAGNOSIS — M0600 Rheumatoid arthritis without rheumatoid factor, unspecified site: Secondary | ICD-10-CM

## 2019-10-06 HISTORY — DX: Rheumatoid arthritis without rheumatoid factor, unspecified site: M06.00

## 2019-10-08 DIAGNOSIS — M79641 Pain in right hand: Secondary | ICD-10-CM | POA: Diagnosis not present

## 2019-10-08 DIAGNOSIS — M7989 Other specified soft tissue disorders: Secondary | ICD-10-CM | POA: Diagnosis not present

## 2019-10-08 DIAGNOSIS — M549 Dorsalgia, unspecified: Secondary | ICD-10-CM | POA: Diagnosis not present

## 2019-10-08 DIAGNOSIS — Z79899 Other long term (current) drug therapy: Secondary | ICD-10-CM | POA: Diagnosis not present

## 2019-10-08 DIAGNOSIS — I2510 Atherosclerotic heart disease of native coronary artery without angina pectoris: Secondary | ICD-10-CM | POA: Diagnosis not present

## 2019-10-08 DIAGNOSIS — J449 Chronic obstructive pulmonary disease, unspecified: Secondary | ICD-10-CM | POA: Diagnosis not present

## 2019-10-08 DIAGNOSIS — M545 Low back pain: Secondary | ICD-10-CM | POA: Diagnosis not present

## 2019-10-08 DIAGNOSIS — M0609 Rheumatoid arthritis without rheumatoid factor, multiple sites: Secondary | ICD-10-CM | POA: Diagnosis not present

## 2019-10-08 DIAGNOSIS — M79671 Pain in right foot: Secondary | ICD-10-CM | POA: Diagnosis not present

## 2019-10-08 DIAGNOSIS — M79672 Pain in left foot: Secondary | ICD-10-CM | POA: Diagnosis not present

## 2019-10-08 DIAGNOSIS — M79673 Pain in unspecified foot: Secondary | ICD-10-CM | POA: Diagnosis not present

## 2019-10-08 DIAGNOSIS — M79642 Pain in left hand: Secondary | ICD-10-CM | POA: Diagnosis not present

## 2019-10-08 DIAGNOSIS — M79643 Pain in unspecified hand: Secondary | ICD-10-CM | POA: Diagnosis not present

## 2019-10-08 DIAGNOSIS — M1990 Unspecified osteoarthritis, unspecified site: Secondary | ICD-10-CM | POA: Diagnosis not present

## 2019-10-08 LAB — POCT ERYTHROCYTE SEDIMENTATION RATE, NON-AUTOMATED: Sed Rate: 4

## 2019-10-20 ENCOUNTER — Encounter: Payer: Self-pay | Admitting: Family Medicine

## 2019-12-07 ENCOUNTER — Encounter: Payer: Self-pay | Admitting: Family Medicine

## 2019-12-15 DIAGNOSIS — M79673 Pain in unspecified foot: Secondary | ICD-10-CM | POA: Diagnosis not present

## 2019-12-15 DIAGNOSIS — J449 Chronic obstructive pulmonary disease, unspecified: Secondary | ICD-10-CM | POA: Diagnosis not present

## 2019-12-15 DIAGNOSIS — M79643 Pain in unspecified hand: Secondary | ICD-10-CM | POA: Diagnosis not present

## 2019-12-15 DIAGNOSIS — I2510 Atherosclerotic heart disease of native coronary artery without angina pectoris: Secondary | ICD-10-CM | POA: Diagnosis not present

## 2019-12-15 DIAGNOSIS — M549 Dorsalgia, unspecified: Secondary | ICD-10-CM | POA: Diagnosis not present

## 2019-12-15 DIAGNOSIS — Z79899 Other long term (current) drug therapy: Secondary | ICD-10-CM | POA: Diagnosis not present

## 2019-12-15 DIAGNOSIS — M0609 Rheumatoid arthritis without rheumatoid factor, multiple sites: Secondary | ICD-10-CM | POA: Diagnosis not present

## 2019-12-15 DIAGNOSIS — M7989 Other specified soft tissue disorders: Secondary | ICD-10-CM | POA: Diagnosis not present

## 2019-12-15 DIAGNOSIS — M1990 Unspecified osteoarthritis, unspecified site: Secondary | ICD-10-CM | POA: Diagnosis not present

## 2019-12-18 ENCOUNTER — Encounter: Payer: Self-pay | Admitting: Family Medicine

## 2019-12-21 ENCOUNTER — Ambulatory Visit: Payer: Medicare Other | Admitting: Family Medicine

## 2019-12-25 ENCOUNTER — Other Ambulatory Visit: Payer: Self-pay

## 2019-12-28 ENCOUNTER — Ambulatory Visit (INDEPENDENT_AMBULATORY_CARE_PROVIDER_SITE_OTHER): Payer: Medicare Other | Admitting: Family Medicine

## 2019-12-28 ENCOUNTER — Encounter: Payer: Self-pay | Admitting: Family Medicine

## 2019-12-28 ENCOUNTER — Other Ambulatory Visit: Payer: Self-pay

## 2019-12-28 VITALS — BP 136/83 | HR 92 | Temp 98.0°F | Resp 16 | Ht 64.0 in | Wt 207.8 lb

## 2019-12-28 DIAGNOSIS — Z79899 Other long term (current) drug therapy: Secondary | ICD-10-CM | POA: Diagnosis not present

## 2019-12-28 DIAGNOSIS — G894 Chronic pain syndrome: Secondary | ICD-10-CM | POA: Diagnosis not present

## 2019-12-28 DIAGNOSIS — F411 Generalized anxiety disorder: Secondary | ICD-10-CM | POA: Diagnosis not present

## 2019-12-28 DIAGNOSIS — M0609 Rheumatoid arthritis without rheumatoid factor, multiple sites: Secondary | ICD-10-CM | POA: Diagnosis not present

## 2019-12-28 DIAGNOSIS — M170 Bilateral primary osteoarthritis of knee: Secondary | ICD-10-CM | POA: Diagnosis not present

## 2019-12-28 DIAGNOSIS — R739 Hyperglycemia, unspecified: Secondary | ICD-10-CM | POA: Diagnosis not present

## 2019-12-28 DIAGNOSIS — R7303 Prediabetes: Secondary | ICD-10-CM

## 2019-12-28 LAB — POCT ERYTHROCYTE SEDIMENTATION RATE, NON-AUTOMATED: Sed Rate: 7

## 2019-12-28 LAB — BASIC METABOLIC PANEL
BUN: 12 (ref 4–21)
CO2: 31 — AB (ref 13–22)
Chloride: 106 (ref 99–108)
Creatinine: 0.7 (ref 0.5–1.1)
Glucose: 98
Potassium: 4.6 (ref 3.4–5.3)
Sodium: 146 (ref 137–147)

## 2019-12-28 LAB — COMPREHENSIVE METABOLIC PANEL
Albumin: 4.1 (ref 3.5–5.0)
Calcium: 10 (ref 8.7–10.7)
GFR calc Af Amer: 89.43
GFR calc non Af Amer: 108.2
Globulin: 3.1

## 2019-12-28 LAB — HEPATIC FUNCTION PANEL
ALT: 31 (ref 7–35)
AST: 15 (ref 13–35)
Alkaline Phosphatase: 57 (ref 25–125)
Bilirubin, Total: 0.5

## 2019-12-28 LAB — POCT GLYCOSYLATED HEMOGLOBIN (HGB A1C)
HbA1c POC (<> result, manual entry): 6.2 % (ref 4.0–5.6)
HbA1c, POC (controlled diabetic range): 6.2 % (ref 0.0–7.0)
HbA1c, POC (prediabetic range): 6.2 % (ref 5.7–6.4)
Hemoglobin A1C: 6.2 % — AB (ref 4.0–5.6)

## 2019-12-28 LAB — CBC AND DIFFERENTIAL
HCT: 43 (ref 36–46)
Hemoglobin: 14 (ref 12.0–16.0)
Platelets: 297 (ref 150–399)
WBC: 13.1

## 2019-12-28 LAB — CBC: RBC: 4.68 (ref 3.87–5.11)

## 2019-12-28 MED ORDER — OXYCODONE HCL 5 MG PO TABS: ORAL_TABLET | ORAL | 0 refills | Status: DC

## 2019-12-28 MED ORDER — LISINOPRIL 5 MG PO TABS: 5.0000 mg | ORAL_TABLET | Freq: Every day | ORAL | 0 refills | Status: DC

## 2019-12-28 NOTE — Progress Notes (Signed)
OFFICE VISIT  12/28/2019   CC:  Chief Complaint  Patient presents with  . Follow-up    RCI, pt is fasting   HPI:    Patient is a 64 y.o. Caucasian female who presents for 3 mo f/u chronic pain syndrome, GAD, prediabetes, elevated bp w/out dx htn, and arthritis.  After last f/u visit all lab work was normal and hand x-ray showed no erosive changes.  I referred her to rheum and she was subsequently dx'd with seronegative RA. Her pain in feet/hands is signif improved but still significant.  Plan is repeat labs today with rheum. Continuing on pred 5mg  qd and methotrexate 2.5mg , 4 tabs q week. Has gained wt and is seeing some elevated sugars on prednisone.   Chronic pain syndrome: Indication for chronic opioid:chronic bilat LBP w/out sciatica, and chronic pain from bilat osteoarthritis knees. Medication and dose:oxycodone 5mg , 1-2 tid prn. # pills per month: #180 Last UDS date:12/17/2018 (approp results) Opioid Treatment Agreement signed (Y/N):Y, 12/17/2018 Opioid Treatment Agreement last reviewed with patient:today NCCSRS reviewed this encounter (include red flags):Y, no red flags. PMP AWARE reviewed today: most recent rx for lorazepam 1mg  was filled 12/22/19, # 44, rx by me. Most recent oxycodone 5mg  rx filled 11/24/19, #180, rx by me. No red flags.  PAIN: Still taking 6 oxycodone tabs per day total.  Pain 5-6/10 pre pain med, goes down to 0-1/10 after pain med works.  Anxiety:  Level way high when she was in a lot of pain, going back down lately with improvement in pain level.  Elev bp w/out dx HTN: home monitoring 130s/80s.  Home glucoses: 140-160 fasting and 2H PP. Having some intermittent polydipsia and polyuria last couple months.  ROS: no fevers, no CP, chronic DOE at baseline w/out worsening since last o/v.  Still occ cough.  No dizziness, no HAs, no rashes, no melena/hematochezia.       Past Medical History:  Diagnosis Date  . Aortic valve stenosis    "very  mild"  . Atypical chest pain 2007; 2015   cardiolyte neg, echo nl, cath showed mild/nonobstructive LAD disease  . Bilateral primary osteoarthritis of knee 06/30/2012   Right >L.  Right unicompartmental knee arthroplasty 10/25/18  . CAD (coronary artery disease)   . Chronic LBP    Multiple surgeries  . COPD (chronic obstructive pulmonary disease) ([place])   . COVID-19 virus infection 06/02/2019   Eval at [facility] ED and d/c'd home.  Admitted 06/12/19 with hypoxic RF and bilat infiltrates.  . DDD (degenerative disc disease)    spinal stenosis  . Fatty liver   . History of GI bleed    NSAIDS  . Hyperlipidemia    mixed  . Microscopic hematuria    full w/ u unrevealing X 2  . Normal nuclear stress test 11/11 and 06/2014   negative, EF normal  . Palpitations 2006   48H holter neg  . Prediabetes    Highest A1c 6.1% as of 03/2017  . RBBB (right bundle branch block)   . Recurrent UTI   . Seronegative rheumatoid arthritis ([place]) 10/2019   multiple sites->pred started, methotrex started 10/2019  . Tobacco dependence in remission    Quit fall 2015.      Past Surgical History:  Procedure Laterality Date  . ABDOMINAL HYSTERECTOMY  1997   DUB  . APPENDECTOMY  1984  . CARDIAC CATHETERIZATION  10/09/2005   no CAD, mildly elevated LVEDP, normal LV function (Dr. Gerrie Nordmann)  . CARDIAC CATHETERIZATION N/A 05/16/2015  Mild non-obstructive CAD--med mgmt.  Procedure: Left Heart Cath and Coronary Angiography;  Surgeon: Pixie Casino, MD;  Location: [facility];  Service: Cardiovascular;  Laterality: N/A;  . CARDIOVASCULAR STRESS TEST  06/2014   normal lexiscan NST  . CARDIOVASCULAR STRESS TEST  2006   persantine - no ischemia, low risk   . KNEE ARTHROSCOPY Bilateral   . left wrist ganglion cyst excision  2010  . [place]   right iliac crest bone graft+metal instrumentation; 2005 metal removal and decompression, 2011 lumbar decompression 4-11, then stabilization/ instrumentation  done 09-19-10: L2,L3, L5 left and L2 , L3, L4 right pedical remnant L4 left embedded. Left iliac crest bone graft-- Dr Velna Ochs  . LUMBAR SPINE SURGERY  02/10/2016   Dr. Velna Ochs: lumbar decompression, instrumentation removal, and fusion exploration--HELPED her a lot, esp her radicular leg pains.  . OOPHORECTOMY Right    cyst  . PARTIAL KNEE ARTHROPLASTY Right 11/04/2018   Procedure: UNICOMPARTMENTAL KNEE;  Surgeon: Renette Butters, MD;  Location: [facility];  Service: Orthopedics;  Laterality: Right;  Adductor Block  . TONSILLECTOMY  64 yrs old  . TONSILLECTOMY    . TRANSTHORACIC ECHOCARDIOGRAM  2006   EF=>55%, trace MR, mild TR, trace AV regurg, trace pulm valve regurg     Outpatient Medications Prior to Visit  Medication Sig Dispense Refill  . acetaminophen (TYLENOL) 500 MG tablet every 6 (six) hours as needed for mild pain or headache.    . albuterol (VENTOLIN HFA) 108 (90 Base) MCG/ACT inhaler Inhale 2 puffs into the lungs every 4 (four) hours as needed for wheezing or shortness of breath (bronchitis). Reported on 03/12/2016 18 g 1  . clopidogrel (PLAVIX) 75 MG tablet Take 1 tablet (75 mg total) by mouth daily. 90 tablet 3  . cyclobenzaprine (FLEXERIL) 10 MG tablet take 1 tablet by mouth every 8 hours if needed 30 tablet 6  . fenofibrate 160 MG tablet take 1 tablet by mouth once daily with food 90 tablet 3  . folic acid (FOLVITE) 1 MG tablet Take 1 mg by mouth daily.     Marland Kitchen LORazepam (ATIVAN) 1 MG tablet Take 1 tablet (1 mg total) by mouth 2 (two) times daily. 60 tablet 5  . methotrexate 2.5 MG tablet Take 2.5 mg by mouth once a week. Pt takes 4 pills.    . pantoprazole (PROTONIX) 40 MG tablet Take 1 tablet (40 mg total) by mouth daily. 90 tablet 3  . predniSONE (DELTASONE) 5 MG tablet Take 5 mg by mouth daily.     . rosuvastatin (CRESTOR) 40 MG tablet Take 1 tablet (40 mg total) by mouth daily. 90 tablet 3  . sodium chloride (OCEAN) 0.65 % SOLN nasal spray Place 1 spray into both nostrils as  needed for congestion.    Marland Kitchen oxyCODONE (OXY IR/ROXICODONE) 5 MG immediate release tablet 1-2 po tid prn pain 180 tablet 0  . nitroGLYCERIN (NITROSTAT) 0.4 MG SL tablet Place 1 tablet (0.4 mg total) under the tongue every 5 (five) minutes as needed for chest pain (MAX 3 doses.). (Patient not taking: Reported on 12/28/2019) 10 tablet 1   No facility-administered medications prior to visit.    Allergies  Allergen Reactions  . Aspirin Other (See Comments)     GI Bleed  . Beta Adrenergic Blockers Other (See Comments)    REACTION: decreased libido  . Nsaids Other (See Comments)     GI Upset  . Penicillins Swelling and Other (See Comments)  DID THE REACTION INVOLVE: Swelling of the face/tongue/throat, SOB, or low BP? Yes Sudden or severe rash/hives, skin peeling, or the inside of the mouth or nose? Yes Did it require medical treatment? Yes When did it last happen? 64 years old If all above answers are "NO", may proceed with cephalosporin use.   Marland Kitchen Prochlorperazine Edisylate Other (See Comments)     Stroke like symptoms  . Sulfonamide Derivatives Other (See Comments)    Unknown allergic reaction  . Amitriptyline Hcl Palpitations and Other (See Comments)     increased heart rate    ROS As per HPI  PE: Blood pressure 136/83, pulse 92, temperature 98 F (36.7 C), temperature source Temporal, resp. rate 16, height 5\' 4"  (1.626 m), weight 207 lb 12.8 oz (94.3 kg), SpO2 90 %. Body mass index is 35.67 kg/m.  Gen: Alert, well appearing.  Patient is oriented to person, place, time, and situation. AFFECT: pleasant, lucid thought and speech. CV: RRR, no m/r/g.   LUNGS: CTA bilat, nonlabored resps, good aeration in all lung fields. EXT: no clubbing or cyanosis.  no edema.    LABS:    Chemistry      Component Value Date/Time   NA 144 09/24/2019 1558   NA 142 06/18/2019 0000   K 4.1 09/24/2019 1558   CL 106 09/24/2019 1558   CO2 27 09/24/2019 1558   BUN 12 09/24/2019 1558   BUN 26 (A)  06/18/2019 0000   CREATININE 0.61 09/24/2019 1558   CREATININE 0.69 06/29/2011 1607      Component Value Date/Time   CALCIUM 10.2 09/24/2019 1558   ALKPHOS 59 09/24/2019 1558   AST 21 09/24/2019 1558   ALT 21 09/24/2019 1558   BILITOT 0.8 09/24/2019 1558     Lab Results  Component Value Date   ESRSEDRATE 4 10/08/2019   Lab Results  Component Value Date   CRP <1.0 09/24/2019   Lab Results  Component Value Date   HGBA1C 5.9 09/24/2019   Lab Results  Component Value Date   CHOL 162 09/24/2019   HDL 44.40 09/24/2019   LDLCALC 93 09/24/2019   LDLDIRECT 172.2 07/14/2014   TRIG 124.0 09/24/2019   CHOLHDL 4 09/24/2019   Lab Results  Component Value Date   WBC 9.5 09/24/2019   HGB 15.6 (H) 09/24/2019   HCT 46.4 (H) 09/24/2019   MCV 91.0 09/24/2019   PLT 244.0 09/24/2019   Lab Results  Component Value Date   TSH 0.42 09/24/2019   Lab Results  Component Value Date   HGBA1C 5.9 09/24/2019   POC A1c 6.2%.  IMPRESSION AND PLAN:  1) Chronic pain syndrome: bilat severe knees DJD and chronic LBP/lumbar spondylosis. Not NSAID candidate. Opioids helping well to keep her functional and improve quality of life. CSC updated today. UDS obtained today: should show oxy and loraz. I did electronic rx's for oxycodone 5mg , 1-2 tid prn, #180  today for this month, March, and April 2021.  Appropriate fill on/after date was noted on each rx.  2) Seroneg RA: improved since getting on daily low dose prednisone and also methotrexate. Pt anticipate increase in methotrexate dose at next rheum f/u. She gets routine methotrex monitoring labs with rheum office today.  3) HTN: time to start med; start lisinopril 5mg  qd today. Continue home monitoring and recheck HTN in 2 wks.  4) Prediab, with more hyperglycemia since getting on chronic pred tx: Repeat A1c was 6.2% today.  No meds at this time.  Hopefully she'll be  able to get off systemic steroids in the near future. She is working on  diabetic diet.  5) Anxiety: The current medical regimen is effective;  continue present plan and medications. [place] renewal today.  UDS today.  An After Visit Summary was printed and given to the patient.  FOLLOW UP: Return in about 2 weeks (around 01/11/2020) for f/u HTN (telemed or in person, her choice).  Signed:  Crissie Sickles, MD           12/28/2019

## 2019-12-30 LAB — PAIN MGMT, PROFILE 8 W/CONF, U
6 Acetylmorphine: NEGATIVE ng/mL
Alcohol Metabolites: NEGATIVE ng/mL (ref <500)
Alphahydroxyalprazolam: NEGATIVE ng/mL
Alphahydroxymidazolam: NEGATIVE ng/mL
Alphahydroxytriazolam: NEGATIVE ng/mL
Aminoclonazepam: NEGATIVE ng/mL
Amphetamines: NEGATIVE ng/mL
Benzodiazepines: POSITIVE ng/mL
Buprenorphine, Urine: NEGATIVE ng/mL
Cocaine Metabolite: NEGATIVE ng/mL
Codeine: NEGATIVE ng/mL
Creatinine: 52.2 mg/dL
Hydrocodone: NEGATIVE ng/mL
Hydromorphone: NEGATIVE ng/mL
Hydroxyethylflurazepam: NEGATIVE ng/mL
Lorazepam: 718 ng/mL
MDMA: NEGATIVE ng/mL
Marijuana Metabolite: NEGATIVE ng/mL
Morphine: NEGATIVE ng/mL
Nordiazepam: NEGATIVE ng/mL
Norhydrocodone: NEGATIVE ng/mL
Noroxycodone: 859 ng/mL
Opiates: NEGATIVE ng/mL
Oxazepam: NEGATIVE ng/mL
Oxidant: NEGATIVE ug/mL
Oxycodone: 465 ng/mL
Oxycodone: POSITIVE ng/mL
Oxymorphone: 1133 ng/mL
Temazepam: NEGATIVE ng/mL
pH: 6.7 (ref 4.5–9.0)

## 2020-01-11 ENCOUNTER — Encounter: Payer: Self-pay | Admitting: Family Medicine

## 2020-01-11 ENCOUNTER — Ambulatory Visit (INDEPENDENT_AMBULATORY_CARE_PROVIDER_SITE_OTHER): Payer: Medicare Other | Admitting: Family Medicine

## 2020-01-11 ENCOUNTER — Other Ambulatory Visit: Payer: Self-pay

## 2020-01-11 VITALS — BP 119/80 | HR 75

## 2020-01-11 DIAGNOSIS — I10 Essential (primary) hypertension: Secondary | ICD-10-CM | POA: Diagnosis not present

## 2020-01-11 NOTE — Progress Notes (Signed)
Virtual Visit via Video Note  I connected with Brenda [name]  on 01/11/20 at  3:00 PM EST by a video enabled telemedicine application and verified that I am speaking with the correct person using two identifiers.  Location patient: home Location provider:work or home office Persons participating in the virtual visit: patient, provider  I discussed the limitations of evaluation and management by telemedicine and the availability of in person appointments. The patient expressed understanding and agreed to proceed.  Telemedicine visit is a necessity given the COVID-19 restrictions in place at the current time.  HPI: 64 y/o WF being seen today for 2 wk f/u HTN.  A/P as of last visit: "1) Chronic pain syndrome: bilat severe knees DJD and chronic LBP/lumbar spondylosis. Not NSAID candidate. Opioids helping well to keep her functional and improve quality of life. CSC updated today. UDS obtained today: should show oxy and loraz. I did electronic rx's for oxycodone 5mg , 1-2 tid prn, #180  today for this month, March, and April 2021.  Appropriate fill on/after date was noted on each rx.  2) Seroneg RA: improved since getting on daily low dose prednisone and also methotrexate. Brenda [name] anticipate increase in methotrexate dose at next rheum f/u. She gets routine methotrex monitoring labs with rheum office today.  3) HTN: time to start med; start lisinopril 5mg  qd today. Continue home monitoring and recheck HTN in 2 wks.  4) Prediab, with more hyperglycemia since getting on chronic pred tx: Repeat A1c was 6.2% today.  No meds at this time.  Hopefully she'll be able to get off systemic steroids in the near future. She is working on diabetic diet.  5) Anxiety: The current medical regimen is effective;  continue present plan and medications. [place] renewal today.  UDS today."  Interim hx: No side effects from med. Avg bp 130/80 or better.  She got labs at rheum after I saw her last and per her report no  probs were found. No records available to me at this time.  ROS: no fevers, no CP, no SOB, no wheezing, no cough, no dizziness, no HAs, no rashes, no melena/hematochezia.  No polyuria or polydipsia.    ROS: See pertinent positives and negatives per HPI.  Past Medical History:  Diagnosis Date  . Aortic valve stenosis    "very mild"  . Atypical chest pain 2007; 2015   cardiolyte neg, echo nl, cath showed mild/nonobstructive LAD disease  . Bilateral primary osteoarthritis of knee 06/30/2012   Right >L.  Right unicompartmental knee arthroplasty 10/25/18  . CAD (coronary artery disease)   . Chronic LBP    Multiple surgeries  . COPD (chronic obstructive pulmonary disease) ([place])   . COVID-19 virus infection 06/02/2019   Eval at [facility] ED and d/c'd home.  Admitted 06/12/19 with hypoxic RF and bilat infiltrates.  . DDD (degenerative disc disease)    spinal stenosis  . Fatty liver   . History of GI bleed    NSAIDS  . Hyperlipidemia    mixed  . Microscopic hematuria    full w/ u unrevealing X 2  . Normal nuclear stress test 11/11 and 06/2014   negative, EF normal  . Palpitations 2006   48H holter neg  . Prediabetes    Highest A1c 6.1% as of 03/2017  . RBBB (right bundle branch block)   . Recurrent UTI   . Seronegative rheumatoid arthritis ([place]) 10/2019   multiple sites->pred started, methotrex started 10/2019  . Tobacco dependence in remission  Quit fall 2015.      Past Surgical History:  Procedure Laterality Date  . ABDOMINAL HYSTERECTOMY  1997   DUB  . APPENDECTOMY  1984  . CARDIAC CATHETERIZATION  10/09/2005   no CAD, mildly elevated LVEDP, normal LV function (Dr. Gerrie Nordmann)  . CARDIAC CATHETERIZATION N/A 05/16/2015   Mild non-obstructive CAD--med mgmt.  Procedure: Left Heart Cath and Coronary Angiography;  Surgeon: Pixie Casino, MD;  Location: [place];  Service: Cardiovascular;  Laterality: N/A;  . CARDIOVASCULAR STRESS TEST  06/2014   normal lexiscan NST  .  CARDIOVASCULAR STRESS TEST  2006   persantine - no ischemia, low risk   . KNEE ARTHROSCOPY Bilateral   . left wrist ganglion cyst excision  2010  . [place]   right iliac crest bone graft+metal instrumentation; 2005 metal removal and decompression, 2011 lumbar decompression 4-11, then stabilization/ instrumentation done 09-19-10: L2,L3, L5 left and L2 , L3, L4 right pedical remnant L4 left embedded. Left iliac crest bone graft-- Dr Velna Ochs  . LUMBAR SPINE SURGERY  02/10/2016   Dr. Velna Ochs: lumbar decompression, instrumentation removal, and fusion exploration--HELPED her a lot, esp her radicular leg pains.  . OOPHORECTOMY Right    cyst  . PARTIAL KNEE ARTHROPLASTY Right 11/04/2018   Procedure: UNICOMPARTMENTAL KNEE;  Surgeon: Renette Butters, MD;  Location: [facility];  Service: Orthopedics;  Laterality: Right;  Adductor Block  . TONSILLECTOMY  64 yrs old  . TONSILLECTOMY    . TRANSTHORACIC ECHOCARDIOGRAM  2006   EF=>55%, trace MR, mild TR, trace AV regurg, trace pulm valve regurg     Family History  Problem Relation Age of Onset  . Heart Problems Mother        and thyroid problems  . Hyperlipidemia Mother   . Diabetes Mother   . Breast cancer Mother   . Heart failure Father        CHF, heart attack, diabetes, hyperlipidemia  . Heart disease Brother        back problems  . Hypertension Brother   . Kidney failure Maternal Grandmother   . Stroke Maternal Grandfather   . Cancer Paternal Grandmother        mouth - snuff  . Heart attack Paternal Grandfather        stroke, HTN  . Hyperlipidemia Brother   . Heart attack Brother   . Cancer Brother   . Coronary artery disease Neg Hx      Current Outpatient Medications:  .  acetaminophen (TYLENOL) 500 MG tablet, every 6 (six) hours as needed for mild pain or headache., Disp: , Rfl:  .  albuterol (VENTOLIN HFA) 108 (90 Base) MCG/ACT inhaler, Inhale 2 puffs into the lungs every 4 (four) hours as needed for wheezing or  shortness of breath (bronchitis). Reported on 03/12/2016, Disp: 18 g, Rfl: 1 .  clopidogrel (PLAVIX) 75 MG tablet, Take 1 tablet (75 mg total) by mouth daily., Disp: 90 tablet, Rfl: 3 .  cyclobenzaprine (FLEXERIL) 10 MG tablet, take 1 tablet by mouth every 8 hours if needed, Disp: 30 tablet, Rfl: 6 .  fenofibrate 160 MG tablet, take 1 tablet by mouth once daily with food, Disp: 90 tablet, Rfl: 3 .  folic acid (FOLVITE) 1 MG tablet, Take 1 mg by mouth daily. , Disp: , Rfl:  .  lisinopril (ZESTRIL) 5 MG tablet, Take 1 tablet (5 mg total) by mouth daily., Disp: 30 tablet, Rfl: 0 .  LORazepam (ATIVAN) 1 MG  tablet, Take 1 tablet (1 mg total) by mouth 2 (two) times daily., Disp: 60 tablet, Rfl: 5 .  methotrexate 2.5 MG tablet, Take 2.5 mg by mouth once a week. Brenda [name] takes 4 pills., Disp: , Rfl:  .  oxyCODONE (OXY IR/ROXICODONE) 5 MG immediate release tablet, 1-2 po tid prn pain, Disp: 180 tablet, Rfl: 0 .  pantoprazole (PROTONIX) 40 MG tablet, Take 1 tablet (40 mg total) by mouth daily., Disp: 90 tablet, Rfl: 3 .  predniSONE (DELTASONE) 5 MG tablet, Take 5 mg by mouth daily. , Disp: , Rfl:  .  rosuvastatin (CRESTOR) 40 MG tablet, Take 1 tablet (40 mg total) by mouth daily., Disp: 90 tablet, Rfl: 3 .  sodium chloride (OCEAN) 0.65 % SOLN nasal spray, Place 1 spray into both nostrils as needed for congestion., Disp: , Rfl:  .  nitroGLYCERIN (NITROSTAT) 0.4 MG SL tablet, Place 1 tablet (0.4 mg total) under the tongue every 5 (five) minutes as needed for chest pain (MAX 3 doses.). (Patient not taking: Reported on 12/28/2019), Disp: 10 tablet, Rfl: 1  EXAM:  VITALS per patient if applicable: BP [id number] (BP Location: Left Arm, Patient Position: Sitting, Cuff Size: Large)   Pulse 75    GENERAL: alert, oriented, appears well and in no acute distress  HEENT: atraumatic, conjunttiva clear, no obvious abnormalities on inspection of external nose and ears  NECK: normal movements of the head and neck  LUNGS: on  inspection no signs of respiratory distress, breathing rate appears normal, no obvious gross SOB, gasping or wheezing  CV: no obvious cyanosis  MS: moves all visible extremities without noticeable abnormality  PSYCH/NEURO: pleasant and cooperative, no obvious depression or anxiety, speech and thought processing grossly intact  LABS: none today    Chemistry      Component Value Date/Time   NA 144 09/24/2019 1558   NA 142 06/18/2019 0000   K 4.1 09/24/2019 1558   CL 106 09/24/2019 1558   CO2 27 09/24/2019 1558   BUN 12 09/24/2019 1558   BUN 26 (A) 06/18/2019 0000   CREATININE 0.61 09/24/2019 1558   CREATININE 0.69 06/29/2011 1607      Component Value Date/Time   CALCIUM 10.2 09/24/2019 1558   ALKPHOS 59 09/24/2019 1558   AST 21 09/24/2019 1558   ALT 21 09/24/2019 1558   BILITOT 0.8 09/24/2019 1558     Lab Results  Component Value Date   WBC 9.5 09/24/2019   HGB 15.6 (H) 09/24/2019   HCT 46.4 (H) 09/24/2019   MCV 91.0 09/24/2019   PLT 244.0 09/24/2019   Lab Results  Component Value Date   HGBA1C 6.2 (A) 12/28/2019   HGBA1C 6.2 12/28/2019   HGBA1C 6.2 12/28/2019   HGBA1C 6.2 12/28/2019   Lab Results  Component Value Date   CHOL 162 09/24/2019   HDL 44.40 09/24/2019   LDLCALC 93 09/24/2019   LDLDIRECT 172.2 07/14/2014   TRIG 124.0 09/24/2019   CHOLHDL 4 09/24/2019   Lab Results  Component Value Date   ESRSEDRATE 4 10/08/2019    ASSESSMENT AND PLAN:  Discussed the following assessment and plan:  HTN: now well controlled. Continue lisinopril 5mg  qd. Continue home bp monitoring several days per week, call if persistent avg >140/90.   I discussed the assessment and treatment plan with the patient. The patient was provided an opportunity to ask questions and all were answered. The patient agreed with the plan and demonstrated an understanding of the instructions.   The patient was  advised to call back or seek an in-person evaluation if the symptoms worsen  or if the condition fails to improve as anticipated.  F/u: 3 mo RCI (PAIN)  Signed:  Crissie Sickles, MD           01/11/2020

## 2020-01-25 ENCOUNTER — Other Ambulatory Visit: Payer: Self-pay

## 2020-01-25 MED ORDER — LISINOPRIL 5 MG PO TABS: 5.0000 mg | ORAL_TABLET | Freq: Every day | ORAL | 0 refills | Status: DC

## 2020-01-26 DIAGNOSIS — M79673 Pain in unspecified foot: Secondary | ICD-10-CM | POA: Diagnosis not present

## 2020-01-26 DIAGNOSIS — M1990 Unspecified osteoarthritis, unspecified site: Secondary | ICD-10-CM | POA: Diagnosis not present

## 2020-01-26 DIAGNOSIS — J449 Chronic obstructive pulmonary disease, unspecified: Secondary | ICD-10-CM | POA: Diagnosis not present

## 2020-01-26 DIAGNOSIS — M0609 Rheumatoid arthritis without rheumatoid factor, multiple sites: Secondary | ICD-10-CM | POA: Diagnosis not present

## 2020-01-26 DIAGNOSIS — M7989 Other specified soft tissue disorders: Secondary | ICD-10-CM | POA: Diagnosis not present

## 2020-01-26 DIAGNOSIS — M79643 Pain in unspecified hand: Secondary | ICD-10-CM | POA: Diagnosis not present

## 2020-01-26 DIAGNOSIS — I2510 Atherosclerotic heart disease of native coronary artery without angina pectoris: Secondary | ICD-10-CM | POA: Diagnosis not present

## 2020-01-26 DIAGNOSIS — Z79899 Other long term (current) drug therapy: Secondary | ICD-10-CM | POA: Diagnosis not present

## 2020-01-26 DIAGNOSIS — M549 Dorsalgia, unspecified: Secondary | ICD-10-CM | POA: Diagnosis not present

## 2020-01-27 LAB — COMPREHENSIVE METABOLIC PANEL
Albumin: 3.9 (ref 3.5–5.0)
Calcium: 9.3 (ref 8.7–10.7)
GFR calc Af Amer: 76.64
GFR calc non Af Amer: 92.73

## 2020-01-27 LAB — HEPATIC FUNCTION PANEL
ALT: 25 (ref 7–35)
AST: 13 (ref 13–35)
Alkaline Phosphatase: 52 (ref 25–125)
Bilirubin, Total: 0.3

## 2020-01-27 LAB — CBC: RBC: 4.45 (ref 3.87–5.11)

## 2020-01-27 LAB — BASIC METABOLIC PANEL
BUN: 18 (ref 4–21)
CO2: 30 — AB (ref 13–22)
Chloride: 107 (ref 99–108)
Creatinine: 0.8 (ref 0.5–1.1)
Glucose: 108
Potassium: 4.6 (ref 3.4–5.3)
Sodium: 145 (ref 137–147)

## 2020-01-27 LAB — CBC AND DIFFERENTIAL
HCT: 42 (ref 36–46)
Hemoglobin: 13.4 (ref 12.0–16.0)
Platelets: 259 (ref 150–399)
WBC: 13.3

## 2020-01-27 LAB — POCT ERYTHROCYTE SEDIMENTATION RATE, NON-AUTOMATED: Sed Rate: 10

## 2020-02-03 ENCOUNTER — Encounter: Payer: Self-pay | Admitting: Family Medicine

## 2020-02-10 IMAGING — DX DG KNEE 1-2V PORT*R*
2 series · 2 of 2 positions shown · non-contrast
Comparison: Preoperative study April 07, 2018

CLINICAL DATA: Right partial knee replacement.

EXAM:
PORTABLE RIGHT KNEE - 1-2 VIEW

[knee ap]
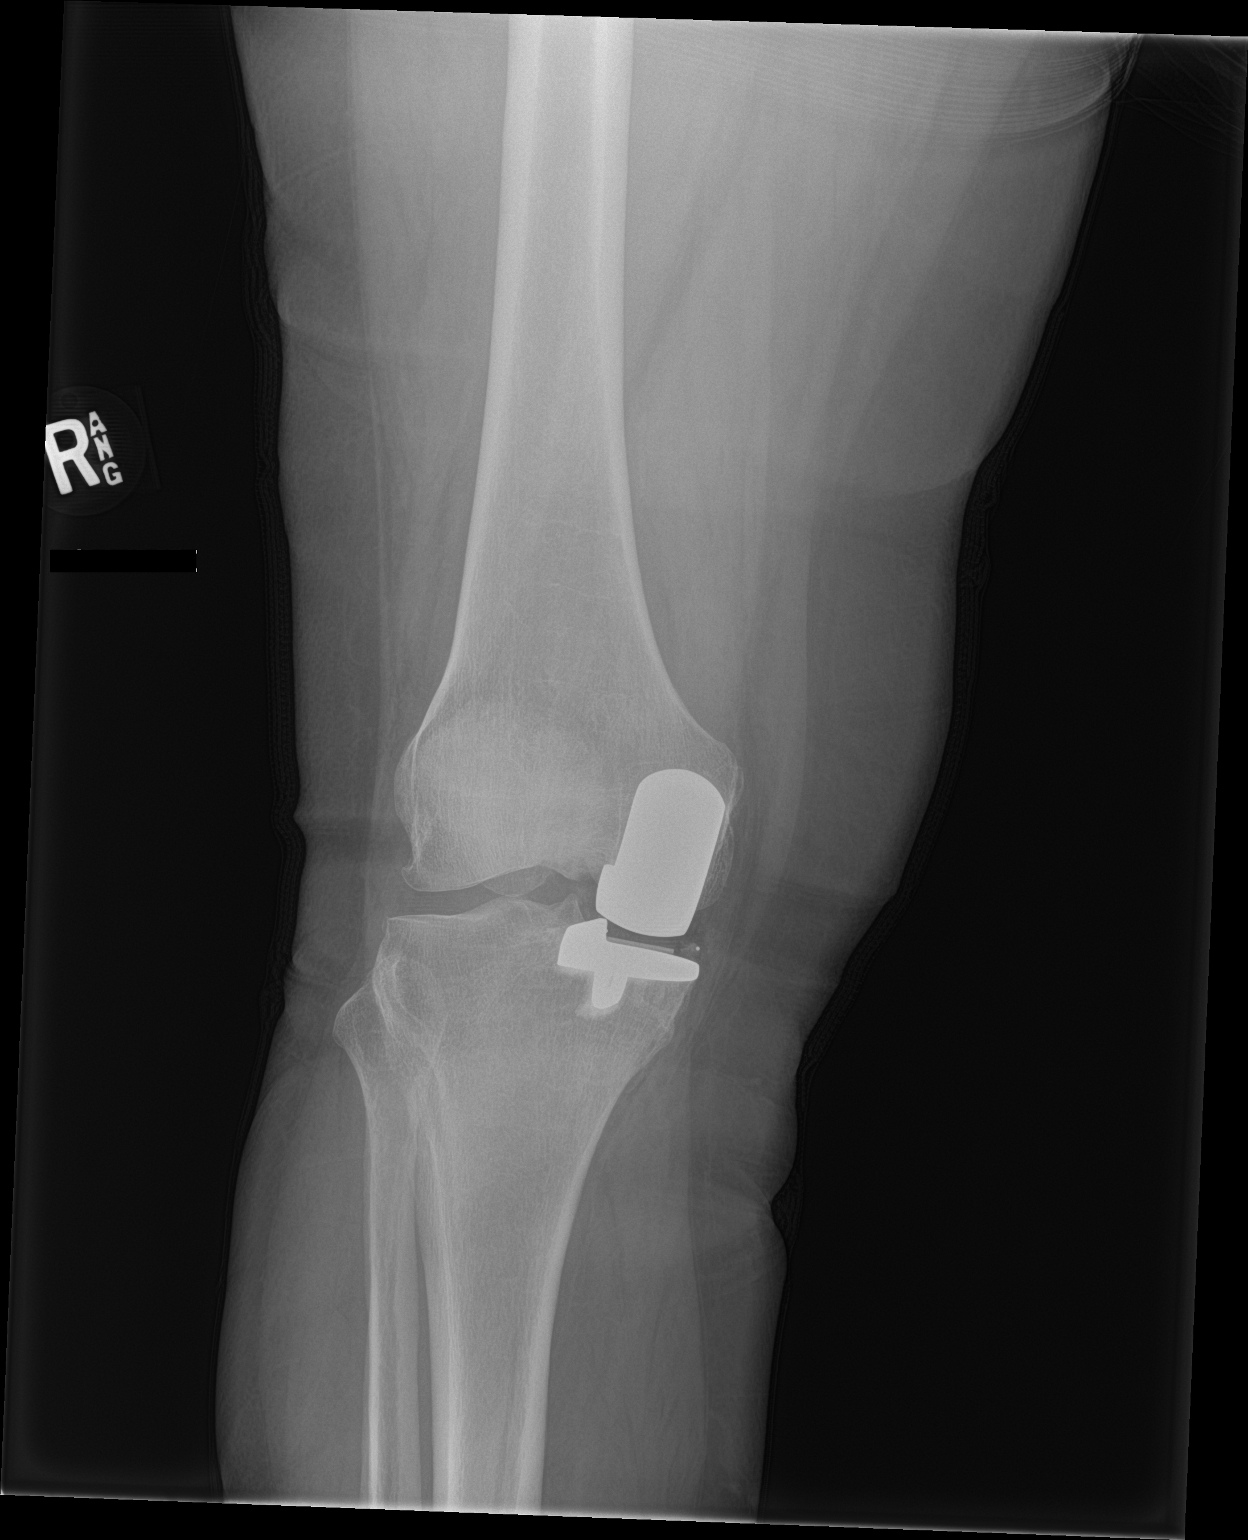

[knee lat]
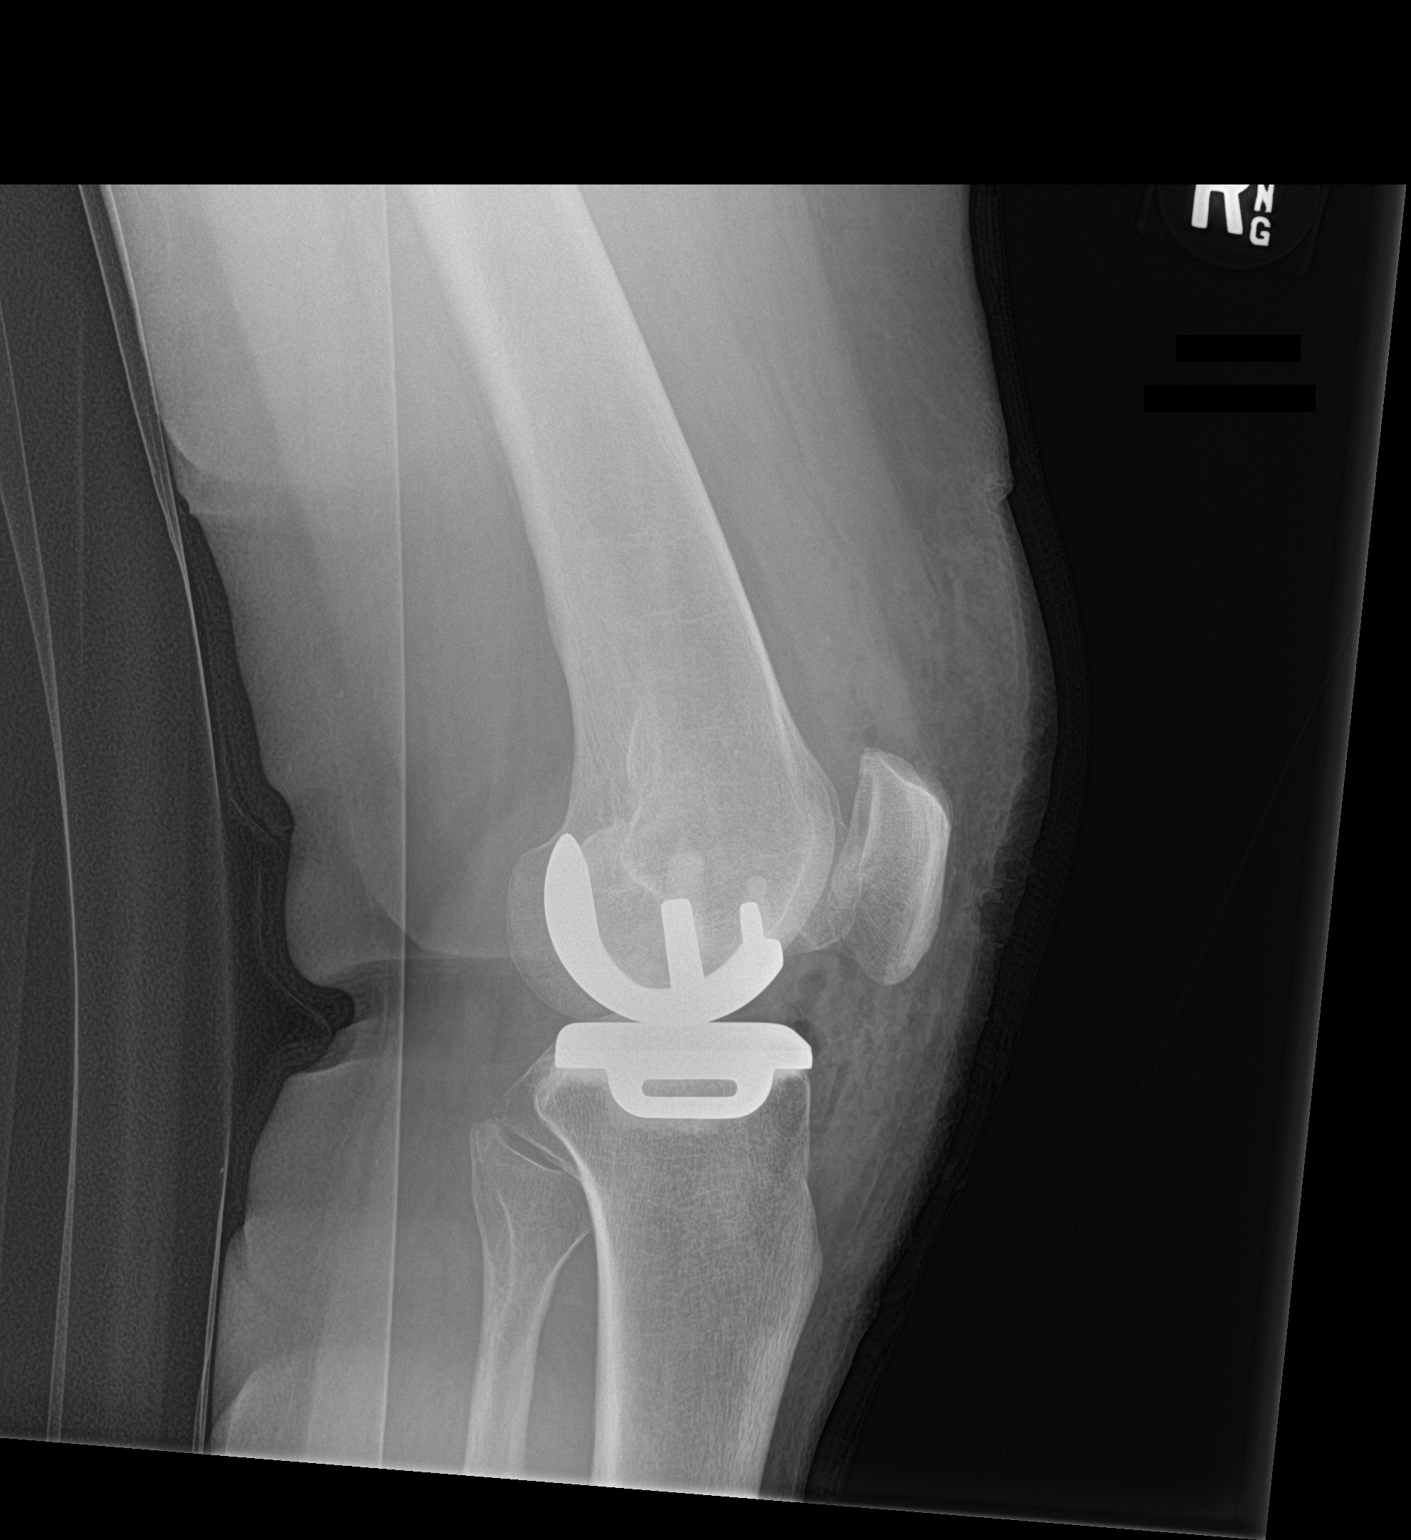

[2 of 2 positions shown; findings below may reference images not displayed]

FINDINGS: The patient has undergone hemiarthroplasty of the right knee
centered on the medial compartment. Radiographic positioning of the
prosthetic components appears good. The interface with the native
bone appears normal.
IMPRESSION: The patient has undergone hemiarthroplasty of the medial compartment
of the right knee. No immediate postprocedure complication.

## 2020-02-11 ENCOUNTER — Other Ambulatory Visit: Payer: Self-pay

## 2020-02-11 MED ORDER — CYCLOBENZAPRINE HCL 10 MG PO TABS: ORAL_TABLET | ORAL | 1 refills | Status: DC

## 2020-02-17 ENCOUNTER — Encounter: Payer: Self-pay | Admitting: Family Medicine

## 2020-03-14 ENCOUNTER — Other Ambulatory Visit: Payer: Self-pay

## 2020-03-14 MED ORDER — LORAZEPAM 1 MG PO TABS: 1.0000 mg | ORAL_TABLET | Freq: Two times a day (BID) | ORAL | 5 refills | Status: DC

## 2020-03-14 NOTE — Telephone Encounter (Signed)
Requesting: Lorazepam  Contract:12/28/19 UDS:12/28/19 Last Visit:01/11/20 Next Visit: 04/11/20 Last Refill:09/24/19(60,5)   Please Advise. Medication pending

## 2020-03-23 DIAGNOSIS — M1990 Unspecified osteoarthritis, unspecified site: Secondary | ICD-10-CM | POA: Diagnosis not present

## 2020-03-23 DIAGNOSIS — M0609 Rheumatoid arthritis without rheumatoid factor, multiple sites: Secondary | ICD-10-CM | POA: Diagnosis not present

## 2020-03-23 DIAGNOSIS — M7989 Other specified soft tissue disorders: Secondary | ICD-10-CM | POA: Diagnosis not present

## 2020-03-23 DIAGNOSIS — M549 Dorsalgia, unspecified: Secondary | ICD-10-CM | POA: Diagnosis not present

## 2020-03-23 DIAGNOSIS — E669 Obesity, unspecified: Secondary | ICD-10-CM | POA: Diagnosis not present

## 2020-03-23 DIAGNOSIS — M79673 Pain in unspecified foot: Secondary | ICD-10-CM | POA: Diagnosis not present

## 2020-03-23 DIAGNOSIS — Z79899 Other long term (current) drug therapy: Secondary | ICD-10-CM | POA: Diagnosis not present

## 2020-03-23 DIAGNOSIS — I2510 Atherosclerotic heart disease of native coronary artery without angina pectoris: Secondary | ICD-10-CM | POA: Diagnosis not present

## 2020-03-23 DIAGNOSIS — J449 Chronic obstructive pulmonary disease, unspecified: Secondary | ICD-10-CM | POA: Diagnosis not present

## 2020-03-23 DIAGNOSIS — M79643 Pain in unspecified hand: Secondary | ICD-10-CM | POA: Diagnosis not present

## 2020-03-25 ENCOUNTER — Other Ambulatory Visit: Payer: Self-pay | Admitting: Family Medicine

## 2020-03-25 NOTE — Telephone Encounter (Signed)
RF request for Cyclobenzaprine 10mg  tablets Last OV 12/28/19 Next OV 04/11/20 Last RX sent 02/11/20 # 30 x 1 rf.  Please advise.

## 2020-03-28 ENCOUNTER — Other Ambulatory Visit: Payer: Self-pay

## 2020-03-28 MED ORDER — OXYCODONE HCL 5 MG PO TABS: ORAL_TABLET | ORAL | 0 refills | Status: DC

## 2020-03-28 MED ORDER — CYCLOBENZAPRINE HCL 10 MG PO TABS: ORAL_TABLET | ORAL | 5 refills | Status: DC

## 2020-03-28 NOTE — Telephone Encounter (Signed)
MyChart message read.

## 2020-03-28 NOTE — Telephone Encounter (Signed)
Requesting: Oxycodone Contract:12/28/19 UDS:12/28/19 Last Visit:01/11/20 Next Visit:04/11/20 Last Refill:12/28/19 #180 for March and April as well  Please Advise. Medication pending

## 2020-03-28 NOTE — Telephone Encounter (Signed)
Patient calling in for a refill request on her pain medication   oxyCODONE (OXY IR/ROXICODONE) 5 MG immediate release tablet   [facility] [phone] - [place], [address] AT [address]

## 2020-03-30 ENCOUNTER — Encounter: Payer: Self-pay | Admitting: Family Medicine

## 2020-04-11 ENCOUNTER — Telehealth (INDEPENDENT_AMBULATORY_CARE_PROVIDER_SITE_OTHER): Payer: Medicare Other | Admitting: Family Medicine

## 2020-04-11 ENCOUNTER — Other Ambulatory Visit: Payer: Self-pay

## 2020-04-11 ENCOUNTER — Encounter: Payer: Self-pay | Admitting: Family Medicine

## 2020-04-11 VITALS — BP 123/77 | HR 77 | Temp 98.2°F

## 2020-04-11 DIAGNOSIS — I10 Essential (primary) hypertension: Secondary | ICD-10-CM

## 2020-04-11 DIAGNOSIS — F4323 Adjustment disorder with mixed anxiety and depressed mood: Secondary | ICD-10-CM | POA: Diagnosis not present

## 2020-04-11 DIAGNOSIS — R7303 Prediabetes: Secondary | ICD-10-CM | POA: Diagnosis not present

## 2020-04-11 DIAGNOSIS — E7800 Pure hypercholesterolemia, unspecified: Secondary | ICD-10-CM

## 2020-04-11 DIAGNOSIS — Z79899 Other long term (current) drug therapy: Secondary | ICD-10-CM

## 2020-04-11 DIAGNOSIS — I2510 Atherosclerotic heart disease of native coronary artery without angina pectoris: Secondary | ICD-10-CM | POA: Diagnosis not present

## 2020-04-11 DIAGNOSIS — G894 Chronic pain syndrome: Secondary | ICD-10-CM

## 2020-04-11 DIAGNOSIS — F411 Generalized anxiety disorder: Secondary | ICD-10-CM

## 2020-04-11 DIAGNOSIS — M545 Low back pain: Secondary | ICD-10-CM

## 2020-04-11 DIAGNOSIS — M170 Bilateral primary osteoarthritis of knee: Secondary | ICD-10-CM

## 2020-04-11 DIAGNOSIS — G8929 Other chronic pain: Secondary | ICD-10-CM

## 2020-04-11 DIAGNOSIS — F4321 Adjustment disorder with depressed mood: Secondary | ICD-10-CM

## 2020-04-11 DIAGNOSIS — F4329 Adjustment disorder with other symptoms: Secondary | ICD-10-CM

## 2020-04-11 MED ORDER — OXYCODONE HCL 5 MG PO TABS: ORAL_TABLET | ORAL | 0 refills | Status: DC

## 2020-04-11 MED ORDER — CITALOPRAM HYDROBROMIDE 20 MG PO TABS: 20.0000 mg | ORAL_TABLET | Freq: Every day | ORAL | 0 refills | Status: DC

## 2020-04-11 NOTE — Progress Notes (Signed)
Virtual Visit via Video Note  I connected with pt on 04/11/20 at  2:00 PM EDT by a video enabled telemedicine application and verified that I am speaking with the correct person using two identifiers.  Location patient: home Location provider:work or home office Persons participating in the virtual visit: patient, provider  I discussed the limitations of evaluation and management by telemedicine and the availability of in person appointments. The patient expressed understanding and agreed to proceed.  Telemedicine visit is a necessity given the COVID-19 restrictions in place at the current time.  HPI: 64 y/o WF being seen today for f/u chronic pain syndrome, GAD, prediabetes, HTN.  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  "Everything's about the same". Start Humira soon.  Still on methotrexate and prednisone at this time. Next rheum visit will be in 5 wks.  PMP AWARE reviewed today: most recent rx for oxycodone 5mg  was filled 03/28/20, # 180, rx by me.  Last fill for lorazepam 1mg  was filled 03/14/20, #60, rx by me. No red flags. Hx of anxiety well controlled historically with prn lorazepam.  However, her mom passed away 4 wks ago.  Between this and the pain from her RA Brenda [name] is very distraught.  Crying all the time, poor sleep, anxious near panic, can't concentrate, is irritable, wants to isolate.  No HI or SI.  Not drinking alc or doing any drugs. Loraz bid helping a little bit. She is open to trial antidepressant for a while.   CSC and UDS UTD.  Prediab: Glucose 132 after BF. BP 123/77 this morning, p 77.  ROS: no fevers, no CP, no SOB, no dizziness, no HAs, no rashes, no melena/hematochezia.  No polyuria or polydipsia.  Chronic arthralgias multiple sites--unchanged.  No focal weakness, paresthesias, or tremors.  No acute vision or hearing abnormalities. No n/v/d or abd pain.  No palpitations.     Past Medical History:  Diagnosis Date  . Aortic valve stenosis    "very mild"   . Atypical chest pain 2007; 2015   cardiolyte neg, echo nl, cath showed mild/nonobstructive LAD disease  . Bilateral primary osteoarthritis of knee 06/30/2012   Right >L.  Right unicompartmental knee arthroplasty 10/25/18  . CAD (coronary artery disease)   . Chronic LBP    Multiple surgeries  . COPD (chronic obstructive pulmonary disease) ([place])   . COVID-19 virus infection 06/02/2019   Eval at [facility] ED and d/c'd home.  Admitted 06/12/19 with hypoxic RF and bilat infiltrates.  . DDD (degenerative disc disease)    spinal stenosis  . Fatty liver   . History of GI bleed    NSAIDS  . Hyperlipidemia    mixed  . Microscopic hematuria    full w/ u unrevealing X 2  . Normal nuclear stress test 11/11 and 06/2014   negative, EF normal  . Palpitations 2006   48H holter neg  . Prediabetes    Highest A1c 6.1% as of 03/2017  . RBBB (right bundle branch block)   . Recurrent UTI   . Seronegative rheumatoid arthritis ([place]) 10/2019   multiple sites->pred started, methotrex started 10/2019; rheum to add another DMARD as of 01/2020  . Tobacco dependence in remission    Quit fall 2015.      Past Surgical History:  Procedure Laterality Date  . ABDOMINAL HYSTERECTOMY  1997   DUB  . APPENDECTOMY  1984  . CARDIAC CATHETERIZATION  10/09/2005   no CAD, mildly elevated LVEDP, normal LV function (Dr. Alfonso Patten.  [place])  . CARDIAC CATHETERIZATION N/A 05/16/2015   Mild non-obstructive CAD--med mgmt.  Procedure: Left Heart Cath and Coronary Angiography;  Surgeon: Pixie Casino, MD;  Location: [place] [facility];  Service: Cardiovascular;  Laterality: N/A;  . CARDIOVASCULAR STRESS TEST  06/2014   normal lexiscan NST  . CARDIOVASCULAR STRESS TEST  2006   persantine - no ischemia, low risk   . KNEE ARTHROSCOPY Bilateral   . left wrist ganglion cyst excision  2010  . [place]   right iliac crest bone graft+metal instrumentation; 2005 metal removal and decompression, 2011 lumbar decompression 4-11,  then stabilization/ instrumentation done 09-19-10: L2,L3, L5 left and L2 , L3, L4 right pedical remnant L4 left embedded. Left iliac crest bone graft-- Dr Velna Ochs  . LUMBAR SPINE SURGERY  02/10/2016   Dr. Velna Ochs: lumbar decompression, instrumentation removal, and fusion exploration--HELPED her a lot, esp her radicular leg pains.  . OOPHORECTOMY Right    cyst  . PARTIAL KNEE ARTHROPLASTY Right 11/04/2018   Procedure: UNICOMPARTMENTAL KNEE;  Surgeon: Renette Butters, MD;  Location: [facility];  Service: Orthopedics;  Laterality: Right;  Adductor Block  . TONSILLECTOMY  64 yrs old  . TONSILLECTOMY    . TRANSTHORACIC ECHOCARDIOGRAM  2006   EF=>55%, trace MR, mild TR, trace AV regurg, trace pulm valve regurg     Family History  Problem Relation Age of Onset  . Heart Problems Mother        and thyroid problems  . Hyperlipidemia Mother   . Diabetes Mother   . Breast cancer Mother   . Heart failure Father        CHF, heart attack, diabetes, hyperlipidemia  . Heart disease Brother        back problems  . Hypertension Brother   . Kidney failure Maternal Grandmother   . Stroke Maternal Grandfather   . Cancer Paternal Grandmother        mouth - snuff  . Heart attack Paternal Grandfather        stroke, HTN  . Hyperlipidemia Brother   . Heart attack Brother   . Cancer Brother   . Coronary artery disease Neg Hx      Current Outpatient Medications:  .  acetaminophen (TYLENOL) 500 MG tablet, every 6 (six) hours as needed for mild pain or headache., Disp: , Rfl:  .  clopidogrel (PLAVIX) 75 MG tablet, Take 1 tablet (75 mg total) by mouth daily., Disp: 90 tablet, Rfl: 3 .  cyclobenzaprine (FLEXERIL) 10 MG tablet, take 1 tablet by mouth every 8 hours if needed, Disp: 30 tablet, Rfl: 5 .  fenofibrate 160 MG tablet, take 1 tablet by mouth once daily with food, Disp: 90 tablet, Rfl: 3 .  folic acid (FOLVITE) 1 MG tablet, Take 1 mg by mouth daily. , Disp: , Rfl:  .  lisinopril (ZESTRIL) 5 MG tablet,  Take 1 tablet (5 mg total) by mouth daily., Disp: 90 tablet, Rfl: 0 .  LORazepam (ATIVAN) 1 MG tablet, Take 1 tablet (1 mg total) by mouth 2 (two) times daily., Disp: 60 tablet, Rfl: 5 .  methotrexate 2.5 MG tablet, Take 2.5 mg by mouth once a week. Pt takes 4 pills., Disp: , Rfl:  .  oxyCODONE (OXY IR/ROXICODONE) 5 MG immediate release tablet, 1-2 po tid prn pain, Disp: 180 tablet, Rfl: 0 .  pantoprazole (PROTONIX) 40 MG tablet, Take 1 tablet (40 mg total) by mouth daily., Disp: 90 tablet, Rfl: 3 .  predniSONE (  DELTASONE) 5 MG tablet, Take 5 mg by mouth daily. , Disp: , Rfl:  .  rosuvastatin (CRESTOR) 40 MG tablet, Take 1 tablet (40 mg total) by mouth daily., Disp: 90 tablet, Rfl: 3 .  sodium chloride (OCEAN) 0.65 % SOLN nasal spray, Place 1 spray into both nostrils as needed for congestion., Disp: , Rfl:  .  albuterol (VENTOLIN HFA) 108 (90 Base) MCG/ACT inhaler, Inhale 2 puffs into the lungs every 4 (four) hours as needed for wheezing or shortness of breath (bronchitis). Reported on 03/12/2016 (Patient not taking: Reported on 04/11/2020), Disp: 18 g, Rfl: 1 .  nitroGLYCERIN (NITROSTAT) 0.4 MG SL tablet, Place 1 tablet (0.4 mg total) under the tongue every 5 (five) minutes as needed for chest pain (MAX 3 doses.). (Patient not taking: Reported on 12/28/2019), Disp: 10 tablet, Rfl: 1  EXAM:  VITALS per patient if applicable: Vitals with BMI 04/11/2020 01/11/2020 12/28/2019  Height - - 5\' 4"   Weight - - 207 lbs 13 oz  BMI - - 56.25  Systolic [phone]  Diastolic 77 80 83  Pulse 77 75 92      GENERAL: alert, oriented, appears well and in no acute distress  HEENT: atraumatic, conjunttiva clear, no obvious abnormalities on inspection of external nose and ears  NECK: normal movements of the head and neck  LUNGS: on inspection no signs of respiratory distress, breathing rate appears normal, no obvious gross SOB, gasping or wheezing  CV: no obvious cyanosis  MS: moves all visible extremities  without noticeable abnormality  PSYCH/NEURO: pleasant and cooperative, no obvious depression or anxiety, speech and thought processing grossly intact  Lab Results  Component Value Date   TSH 0.42 09/24/2019   Lab Results  Component Value Date   WBC 13.3 01/27/2020   HGB 13.4 01/27/2020   HCT 42 01/27/2020   MCV 91.0 09/24/2019   PLT 259 01/27/2020   Lab Results  Component Value Date   CREATININE 0.8 01/27/2020   BUN 18 01/27/2020   NA 145 01/27/2020   K 4.6 01/27/2020   CL 107 01/27/2020   CO2 30 (A) 01/27/2020   Lab Results  Component Value Date   ALT 25 01/27/2020   AST 13 01/27/2020   ALKPHOS 52 01/27/2020   BILITOT 0.8 09/24/2019   Lab Results  Component Value Date   CHOL 162 09/24/2019   Lab Results  Component Value Date   HDL 44.40 09/24/2019   Lab Results  Component Value Date   LDLCALC 93 09/24/2019   Lab Results  Component Value Date   TRIG 124.0 09/24/2019   Lab Results  Component Value Date   CHOLHDL 4 09/24/2019   Lab Results  Component Value Date   HGBA1C 6.2 (A) 12/28/2019   HGBA1C 6.2 12/28/2019   HGBA1C 6.2 12/28/2019   HGBA1C 6.2 12/28/2019   ASSESSMENT AND PLAN:  Discussed the following assessment and plan:  Abnormal grief response/adjustment d/o w/depressed and anxious mood, superimposed on GAD.  Start trial of citalopram 20mg  qd. Therapeutic expectations and side effect profile of medication discussed today.  Patient's questions answered.  All other chronic problems noted below are stable: no changes. She is due for some labs: she'll f/u 3-4 wks for her mood/anxiety and we'll get fasting lipids, CMET, A1c, and CBC.  Essential hypertension - Plan: CBC with Differential/Platelet, Comprehensive metabolic panel  Prediabetes - Plan: Hemoglobin A1c, CBC with Differential/Platelet, Comprehensive metabolic panel  Hypercholesterolemia - Plan: Lipid panel, Comprehensive metabolic panel  Coronary artery  disease involving native  coronary artery of native heart without angina pectoris - Plan: Comprehensive metabolic panel  High risk medication use - Plan: CBC with Differential/Platelet, Comprehensive metabolic panel  Chronic pain syndrome  Bilateral primary osteoarthritis of knee  Chronic bilateral low back pain without sciatica The current medical regimen is effective;  continue present plan and medications. I did electronic rx's for oxycodone 5mg , 1-2 tid prn, #180 today for each of the next 3 months.  Appropriate fill on/after date was noted on each rx.  -we discussed possible serious and likely etiologies, options for evaluation and workup, limitations of telemedicine visit vs in person visit, treatment, treatment risks and precautions. Pt prefers to treat via telemedicine empirically rather then risking or undertaking an in person visit at this moment. Patient agrees to seek prompt in person care if worsening, new symptoms arise, or if is not improving with treatment.   I discussed the assessment and treatment plan with the patient. The patient was provided an opportunity to ask questions and all were answered. The patient agreed with the plan and demonstrated an understanding of the instructions.   The patient was advised to call back or seek an in-person evaluation if the symptoms worsen or if the condition fails to improve as anticipated.  F/u: 3-4 wks  Signed:  Crissie Sickles, MD           04/11/2020

## 2020-04-19 ENCOUNTER — Telehealth: Payer: Self-pay

## 2020-04-19 NOTE — Telephone Encounter (Signed)
Patient does not want to continue on med. citalopram (CELEXA) 20 MG tablet [[id number] States that it makes her so sick to her stomach.    Patient can be reached at [phone]

## 2020-04-19 NOTE — Telephone Encounter (Signed)
Sent as FYI.  Patient will continue with lorazepam only and d/c citalopram. Citalopram added to allergy list as intolerance. Advised to f/u with PCP as recommended and appt already set for 6/28

## 2020-04-20 NOTE — Telephone Encounter (Signed)
Noted  

## 2020-04-25 ENCOUNTER — Other Ambulatory Visit: Payer: Self-pay | Admitting: Family Medicine

## 2020-04-25 MED ORDER — LISINOPRIL 5 MG PO TABS: 5.0000 mg | ORAL_TABLET | Freq: Every day | ORAL | 1 refills | Status: DC

## 2020-05-02 ENCOUNTER — Ambulatory Visit (INDEPENDENT_AMBULATORY_CARE_PROVIDER_SITE_OTHER): Payer: Medicare Other | Admitting: Family Medicine

## 2020-05-02 ENCOUNTER — Other Ambulatory Visit: Payer: Self-pay

## 2020-05-02 ENCOUNTER — Encounter: Payer: Self-pay | Admitting: Family Medicine

## 2020-05-02 VITALS — BP 125/79 | HR 82 | Temp 97.9°F | Resp 16 | Ht 64.0 in | Wt 218.6 lb

## 2020-05-02 DIAGNOSIS — I2510 Atherosclerotic heart disease of native coronary artery without angina pectoris: Secondary | ICD-10-CM | POA: Diagnosis not present

## 2020-05-02 DIAGNOSIS — I10 Essential (primary) hypertension: Secondary | ICD-10-CM

## 2020-05-02 DIAGNOSIS — Z79899 Other long term (current) drug therapy: Secondary | ICD-10-CM

## 2020-05-02 DIAGNOSIS — E7800 Pure hypercholesterolemia, unspecified: Secondary | ICD-10-CM | POA: Diagnosis not present

## 2020-05-02 DIAGNOSIS — R7303 Prediabetes: Secondary | ICD-10-CM

## 2020-05-02 DIAGNOSIS — J449 Chronic obstructive pulmonary disease, unspecified: Secondary | ICD-10-CM | POA: Diagnosis not present

## 2020-05-02 NOTE — Progress Notes (Signed)
OFFICE VISIT  05/02/2020   CC:  Chief Complaint  Patient presents with  . Follow-up    RCI, pt is fasting   HPI:    Patient is a 64 y.o. Caucasian female who presents for 3 week f/u HTN, HLD, prediabetes. Has seroneg RA, has now started humira in addition to her methotrexate and low dose prednisone for this.  First inj of humira was 04/25/20, schedule with be every other week.  Nothing new to talk about today. No signif prob with breathing: specifically wheezing, cough, or fevers.  She gets SOB with walking fast. Takes 15 sec or so to get back to her baseline.  Glucose at home: checks fasting and 2HH. Avg fasting is about 100.   Avg 2H PP 120.  Tolerating statin. BP: checks bp 2-3 times per day and always <130/80.  She eats no more than her baseline over the last few years, yet since being on daily steroids for her RA she has gradually gained wt (about 26 lb up over the last 8 mo).  ROS: no signif snoring.  No witnessed apnea in sleep.  Some shallow resp in sleep noted by husband.  No excessive daytime sleepiness.  no fevers, no CP, no SOB, no wheezing, no cough, no dizziness, no HAs, no rashes, no melena/hematochezia.  No polyuria or polydipsia.  Chronic arthralgias multiple sites.  No focal weakness, paresthesias, or tremors.  No acute vision or hearing abnormalities. No n/v/d or abd pain.  No palpitations.    Past Medical History:  Diagnosis Date  . Aortic valve stenosis    "very mild"  . Atypical chest pain 2007; 2015   cardiolyte neg, echo nl, cath showed mild/nonobstructive LAD disease  . Bilateral primary osteoarthritis of knee 06/30/2012   Right >L.  Right unicompartmental knee arthroplasty 10/25/18  . CAD (coronary artery disease)   . Chronic LBP    Multiple surgeries  . COPD (chronic obstructive pulmonary disease) ([place])   . COVID-19 virus infection 06/02/2019   Eval at [facility] ED and d/c'd home.  Admitted 06/12/19 with hypoxic RF and bilat infiltrates.  . DDD  (degenerative disc disease)    spinal stenosis  . Fatty liver   . History of GI bleed    NSAIDS  . Hyperlipidemia    mixed  . Microscopic hematuria    full w/ u unrevealing X 2  . Normal nuclear stress test 11/11 and 06/2014   negative, EF normal  . Palpitations 2006   48H holter neg  . Prediabetes    Highest A1c 6.1% as of 03/2017  . RBBB (right bundle branch block)   . Recurrent UTI   . Seronegative rheumatoid arthritis ([place]) 10/2019   multiple sites->pred started, methotrex started 10/2019; rheum to add another DMARD as of 01/2020  . Tobacco dependence in remission    Quit fall 2015.      Past Surgical History:  Procedure Laterality Date  . ABDOMINAL HYSTERECTOMY  1997   DUB  . APPENDECTOMY  1984  . CARDIAC CATHETERIZATION  10/09/2005   no CAD, mildly elevated LVEDP, normal LV function (Dr. Gerrie Nordmann)  . CARDIAC CATHETERIZATION N/A 05/16/2015   Mild non-obstructive CAD--med mgmt.  Procedure: Left Heart Cath and Coronary Angiography;  Surgeon: Pixie Casino, MD;  Location: [place] [facility];  Service: Cardiovascular;  Laterality: N/A;  . CARDIOVASCULAR STRESS TEST  06/2014   normal lexiscan NST  . CARDIOVASCULAR STRESS TEST  2006   persantine - no ischemia, low risk   .  KNEE ARTHROSCOPY Bilateral   . left wrist ganglion cyst excision  2010  . [place]   right iliac crest bone graft+metal instrumentation; 2005 metal removal and decompression, 2011 lumbar decompression 4-11, then stabilization/ instrumentation done 09-19-10: L2,L3, L5 left and L2 , L3, L4 right pedical remnant L4 left embedded. Left iliac crest bone graft-- Dr Velna Ochs  . LUMBAR SPINE SURGERY  02/10/2016   Dr. Velna Ochs: lumbar decompression, instrumentation removal, and fusion exploration--HELPED her a lot, esp her radicular leg pains.  . OOPHORECTOMY Right    cyst  . PARTIAL KNEE ARTHROPLASTY Right 11/04/2018   Procedure: UNICOMPARTMENTAL KNEE;  Surgeon: Renette Butters, MD;  Location: [facility];  Service: Orthopedics;  Laterality: Right;  Adductor Block  . TONSILLECTOMY  64 yrs old  . TONSILLECTOMY    . TRANSTHORACIC ECHOCARDIOGRAM  2006   EF=>55%, trace MR, mild TR, trace AV regurg, trace pulm valve regurg     Outpatient Medications Prior to Visit  Medication Sig Dispense Refill  . acetaminophen (TYLENOL) 500 MG tablet every 6 (six) hours as needed for mild pain or headache.    . Adalimumab (HUMIRA [place]) Inject 40 mg into the skin. Every other week    . clopidogrel (PLAVIX) 75 MG tablet Take 1 tablet (75 mg total) by mouth daily. 90 tablet 3  . cyclobenzaprine (FLEXERIL) 10 MG tablet take 1 tablet by mouth every 8 hours if needed 30 tablet 5  . fenofibrate 160 MG tablet take 1 tablet by mouth once daily with food 90 tablet 3  . folic acid (FOLVITE) 1 MG tablet Take 1 mg by mouth daily.     Marland Kitchen lisinopril (ZESTRIL) 5 MG tablet Take 1 tablet (5 mg total) by mouth daily. 90 tablet 1  . LORazepam (ATIVAN) 1 MG tablet Take 1 tablet (1 mg total) by mouth 2 (two) times daily. 60 tablet 5  . methotrexate 2.5 MG tablet Take 2.5 mg by mouth once a week. Pt takes 4 pills.    Marland Kitchen oxyCODONE (OXY IR/ROXICODONE) 5 MG immediate release tablet 1-2 po tid prn pain 180 tablet 0  . pantoprazole (PROTONIX) 40 MG tablet Take 1 tablet (40 mg total) by mouth daily. 90 tablet 3  . predniSONE (DELTASONE) 5 MG tablet Take 5 mg by mouth daily.     . rosuvastatin (CRESTOR) 40 MG tablet Take 1 tablet (40 mg total) by mouth daily. 90 tablet 3  . sodium chloride (OCEAN) 0.65 % SOLN nasal spray Place 1 spray into both nostrils as needed for congestion.    Marland Kitchen albuterol (VENTOLIN HFA) 108 (90 Base) MCG/ACT inhaler Inhale 2 puffs into the lungs every 4 (four) hours as needed for wheezing or shortness of breath (bronchitis). Reported on 03/12/2016 (Patient not taking: Reported on 04/11/2020) 18 g 1  . nitroGLYCERIN (NITROSTAT) 0.4 MG SL tablet Place 1 tablet (0.4 mg total) under the tongue every 5 (five) minutes as needed for  chest pain (MAX 3 doses.). (Patient not taking: Reported on 12/28/2019) 10 tablet 1  . citalopram (CELEXA) 20 MG tablet Take 1 tablet (20 mg total) by mouth daily. 30 tablet 0   No facility-administered medications prior to visit.    Allergies  Allergen Reactions  . Aspirin Other (See Comments)     GI Bleed  . Beta Adrenergic Blockers Other (See Comments)    REACTION: decreased libido  . Citalopram Nausea Only  . Nsaids Other (See Comments)     GI Upset  . Penicillins  Swelling and Other (See Comments)    DID THE REACTION INVOLVE: Swelling of the face/tongue/throat, SOB, or low BP? Yes Sudden or severe rash/hives, skin peeling, or the inside of the mouth or nose? Yes Did it require medical treatment? Yes When did it last happen? 64 years old If all above answers are "NO", may proceed with cephalosporin use.   Marland Kitchen Prochlorperazine Edisylate Other (See Comments)     Stroke like symptoms  . Sulfonamide Derivatives Other (See Comments)    Unknown allergic reaction  . Amitriptyline Hcl Palpitations and Other (See Comments)     increased heart rate    PE: Vitals with BMI 05/02/2020 04/11/2020 01/11/2020  Height 5\' 4"  - -  Weight 218 lbs 10 oz - -  BMI 41.3 - -  Systolic 244 010 272  Diastolic 79 77 80  Pulse 82 77 75  O2 sat on RA today is 92%  Gen: Alert, well appearing.  Patient is oriented to person, place, time, and situation. AFFECT: pleasant, lucid thought and speech. ZDG:UYQI: no injection, icteris, swelling, or exudate.  EOMI, PERRLA. Mouth: lips without lesion/swelling.  Oral mucosa pink and moist. Oropharynx without erythema, exudate, or swelling.  CV: RRR, no m/r/g.   LUNGS: CTA bilat, nonlabored resps, good aeration in all lung fields. EXT: no clubbing or cyanosis.  no edema.    LABS:  Lab Results  Component Value Date   TSH 0.42 09/24/2019   Lab Results  Component Value Date   WBC 13.3 01/27/2020   HGB 13.4 01/27/2020   HCT 42 01/27/2020   MCV 91.0 09/24/2019    PLT 259 01/27/2020   Lab Results  Component Value Date   CREATININE 0.8 01/27/2020   BUN 18 01/27/2020   NA 145 01/27/2020   K 4.6 01/27/2020   CL 107 01/27/2020   CO2 30 (A) 01/27/2020   Lab Results  Component Value Date   ALT 25 01/27/2020   AST 13 01/27/2020   ALKPHOS 52 01/27/2020   BILITOT 0.8 09/24/2019   Lab Results  Component Value Date   CHOL 162 09/24/2019   Lab Results  Component Value Date   HDL 44.40 09/24/2019   Lab Results  Component Value Date   LDLCALC 93 09/24/2019   Lab Results  Component Value Date   TRIG 124.0 09/24/2019   Lab Results  Component Value Date   CHOLHDL 4 09/24/2019   Lab Results  Component Value Date   HGBA1C 6.2 (A) 12/28/2019   HGBA1C 6.2 12/28/2019   HGBA1C 6.2 12/28/2019   HGBA1C 6.2 12/28/2019    IMPRESSION AND PLAN:  1) COPD: stable. Albuterol prn only. SHe no longer smokes. 02 sat today 92% RA. CBC today.  2) HTN: The current medical regimen is effective;  continue present plan and medications. Lytes/cr today.  3) HLD: tolerating high dose statin. FLP and hepatic panel today.  4) Prediabetes: complicated by semi-long term low dose prednisone for her RA.  Hopefully Humira will help enough to allow her to get off of her prednisone. Glucoses at home are good per her estimate. HbA1c and fasting gluc today.  An After Visit Summary was printed and given to the patient.  FOLLOW UP: No follow-ups on file.  Signed:  Crissie Sickles, MD           05/02/2020

## 2020-05-03 LAB — CBC WITH DIFFERENTIAL/PLATELET
Basophils Absolute: 0.2 10*3/uL — ABNORMAL HIGH (ref 0.0–0.1)
Basophils Relative: 1.6 % (ref 0.0–3.0)
Eosinophils Absolute: 0.3 10*3/uL (ref 0.0–0.7)
Eosinophils Relative: 2.4 % (ref 0.0–5.0)
HCT: 41.9 % (ref 36.0–46.0)
Hemoglobin: 13.8 g/dL (ref 12.0–15.0)
Lymphocytes Relative: 27.7 % (ref 12.0–46.0)
Lymphs Abs: 2.9 10*3/uL (ref 0.7–4.0)
MCHC: 33.1 g/dL (ref 30.0–36.0)
MCV: 95.8 fl (ref 78.0–100.0)
Monocytes Absolute: 0.6 10*3/uL (ref 0.1–1.0)
Monocytes Relative: 5.8 % (ref 3.0–12.0)
Neutro Abs: 6.6 10*3/uL (ref 1.4–7.7)
Neutrophils Relative %: 62.5 % (ref 43.0–77.0)
Platelets: 217 10*3/uL (ref 150.0–400.0)
RBC: 4.37 Mil/uL (ref 3.87–5.11)
RDW: 14.4 % (ref 11.5–15.5)
WBC: 10.5 10*3/uL (ref 4.0–10.5)

## 2020-05-03 LAB — COMPREHENSIVE METABOLIC PANEL
ALT: 40 U/L — ABNORMAL HIGH (ref 0–35)
AST: 36 U/L (ref 0–37)
Albumin: 4.4 g/dL (ref 3.5–5.2)
Alkaline Phosphatase: 47 U/L (ref 39–117)
BUN: 15 mg/dL (ref 6–23)
CO2: 33 mEq/L — ABNORMAL HIGH (ref 19–32)
Calcium: 9.9 mg/dL (ref 8.4–10.5)
Chloride: 102 mEq/L (ref 96–112)
Creatinine, Ser: 0.73 mg/dL (ref 0.40–1.20)
GFR: 80.29 mL/min (ref >60.00)
Glucose, Bld: 118 mg/dL — ABNORMAL HIGH (ref 70–99)
Potassium: 4.6 mEq/L (ref 3.5–5.1)
Sodium: 142 mEq/L (ref 135–145)
Total Bilirubin: 0.7 mg/dL (ref 0.2–1.2)
Total Protein: 6.6 g/dL (ref 6.0–8.3)

## 2020-05-03 LAB — LIPID PANEL
Cholesterol: 137 mg/dL (ref 0–200)
HDL: 48.5 mg/dL (ref >39.00)
LDL Cholesterol: 66 mg/dL (ref 0–99)
NonHDL: 88.24
Total CHOL/HDL Ratio: 3
Triglycerides: 110 mg/dL (ref 0.0–149.0)
VLDL: 22 mg/dL (ref 0.0–40.0)

## 2020-05-03 LAB — HEMOGLOBIN A1C: Hgb A1c MFr Bld: 6 % (ref 4.6–6.5)

## 2020-05-20 ENCOUNTER — Other Ambulatory Visit: Payer: Self-pay | Admitting: Family Medicine

## 2020-05-23 DIAGNOSIS — M0609 Rheumatoid arthritis without rheumatoid factor, multiple sites: Secondary | ICD-10-CM | POA: Diagnosis not present

## 2020-05-23 DIAGNOSIS — Z79899 Other long term (current) drug therapy: Secondary | ICD-10-CM | POA: Diagnosis not present

## 2020-05-23 DIAGNOSIS — I2510 Atherosclerotic heart disease of native coronary artery without angina pectoris: Secondary | ICD-10-CM | POA: Diagnosis not present

## 2020-05-23 DIAGNOSIS — M7989 Other specified soft tissue disorders: Secondary | ICD-10-CM | POA: Diagnosis not present

## 2020-05-23 DIAGNOSIS — M1990 Unspecified osteoarthritis, unspecified site: Secondary | ICD-10-CM | POA: Diagnosis not present

## 2020-05-23 DIAGNOSIS — E669 Obesity, unspecified: Secondary | ICD-10-CM | POA: Diagnosis not present

## 2020-05-23 DIAGNOSIS — M79673 Pain in unspecified foot: Secondary | ICD-10-CM | POA: Diagnosis not present

## 2020-05-23 DIAGNOSIS — M79643 Pain in unspecified hand: Secondary | ICD-10-CM | POA: Diagnosis not present

## 2020-05-23 DIAGNOSIS — J449 Chronic obstructive pulmonary disease, unspecified: Secondary | ICD-10-CM | POA: Diagnosis not present

## 2020-05-23 DIAGNOSIS — M549 Dorsalgia, unspecified: Secondary | ICD-10-CM | POA: Diagnosis not present

## 2020-06-01 ENCOUNTER — Encounter: Payer: Self-pay | Admitting: Family Medicine

## 2020-07-18 ENCOUNTER — Other Ambulatory Visit: Payer: Self-pay

## 2020-07-18 ENCOUNTER — Encounter: Payer: Self-pay | Admitting: Family Medicine

## 2020-07-18 ENCOUNTER — Ambulatory Visit (INDEPENDENT_AMBULATORY_CARE_PROVIDER_SITE_OTHER): Payer: Medicare Other | Admitting: Family Medicine

## 2020-07-18 VITALS — BP 117/72 | HR 82 | Temp 98.1°F | Resp 16 | Ht 64.0 in | Wt 222.4 lb

## 2020-07-18 DIAGNOSIS — E782 Mixed hyperlipidemia: Secondary | ICD-10-CM

## 2020-07-18 DIAGNOSIS — G894 Chronic pain syndrome: Secondary | ICD-10-CM

## 2020-07-18 DIAGNOSIS — M170 Bilateral primary osteoarthritis of knee: Secondary | ICD-10-CM | POA: Diagnosis not present

## 2020-07-18 DIAGNOSIS — M5136 Other intervertebral disc degeneration, lumbar region: Secondary | ICD-10-CM | POA: Diagnosis not present

## 2020-07-18 DIAGNOSIS — R7303 Prediabetes: Secondary | ICD-10-CM | POA: Diagnosis not present

## 2020-07-18 DIAGNOSIS — I10 Essential (primary) hypertension: Secondary | ICD-10-CM

## 2020-07-18 DIAGNOSIS — F411 Generalized anxiety disorder: Secondary | ICD-10-CM

## 2020-07-18 DIAGNOSIS — Z79899 Other long term (current) drug therapy: Secondary | ICD-10-CM | POA: Diagnosis not present

## 2020-07-18 DIAGNOSIS — F4321 Adjustment disorder with depressed mood: Secondary | ICD-10-CM | POA: Diagnosis not present

## 2020-07-18 DIAGNOSIS — I2510 Atherosclerotic heart disease of native coronary artery without angina pectoris: Secondary | ICD-10-CM | POA: Diagnosis not present

## 2020-07-18 MED ORDER — OXYCODONE HCL 5 MG PO TABS: ORAL_TABLET | ORAL | 0 refills | Status: DC

## 2020-07-18 NOTE — Progress Notes (Signed)
OFFICE VISIT  07/18/2020  CC:  Chief Complaint  Patient presents with  . Follow-up    RCI, chronic pain. Pt is not fasting    HPI:    Patient is a 64 y.o. Caucasian female who presents for 3 mo f/u chronic pain syndrome, GAD, prediabetes, HTN.  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 A/P as of last visit: "Abnormal grief response/adjustment d/o w/depressed and anxious mood, superimposed on GAD.  Start trial of citalopram 20mg  qd. Therapeutic expectations and side effect profile of medication discussed today.  Patient's questions answered.  All other chronic problems noted below are stable: no changes. She is due for some labs: she'll f/u 3-4 wks for her mood/anxiety and we'll get fasting lipids, CMET, A1c, and CBC.  Essential hypertension - Plan: CBC with Differential/Platelet, Comprehensive metabolic panel  Prediabetes - Plan: Hemoglobin A1c, CBC with Differential/Platelet, Comprehensive metabolic panel  Hypercholesterolemia - Plan: Lipid panel, Comprehensive metabolic panel  Coronary artery disease involving native coronary artery of native heart without angina pectoris - Plan: Comprehensive metabolic panel  High risk medication use - Plan: CBC with Differential/Platelet, Comprehensive metabolic panel  Chronic pain syndrome  Bilateral primary osteoarthritis of knee  Chronic bilateral low back pain without sciatica The current medical regimen is effective;  continue present plan and medications. I did electronic rx's for oxycodone 5mg , 1-2 tid prn, #180 today for each of the next 3 months.  Appropriate fill on/after date was noted on each rx.  INTERIM HX: Very tired.  Got her q2 wk humira inj today. She is not sure it is helping much. Taking oxycodone as per her usual: 1-2 oxycodone 5mg  tabs tid for her chronic LBP and bilat severe knee DJD. Takes pain to 2-3/10 intensity.  No side effects.  She was unable to tolerate citalopram---took it for 1 wk and  consistently had bad prolonged n/v every dose.  After stopping it all sx's resolved.  Grieving is resolving appropriately with return to use of lorazepam and support of family.  Typical fasting gluc 117, typical 2H PP 125-130. Still taking 5mg  prednisone qd for seroneg RA.  COPD: breathing has been "okay lately".  With ambulation through her house she gets winded and o2 sat drops to 90 at times.  No wheezing.  Stops and rests and feels back to normal in < 5 min.  This has been present since covid illness Aug 2020---gradually getting better.  PMP AWARE reviewed today: most recent rx for oxycodone 5mg  was filled 06/27/20, # 180, rx by me.  Most recent lorazepam 1mg  rx filled was 06/20/20, #60, rx by me. No red flags.  Past Medical History:  Diagnosis Date  . Aortic valve stenosis    "very mild"  . Atypical chest pain 2007; 2015   cardiolyte neg, echo nl, cath showed mild/nonobstructive LAD disease  . Bilateral primary osteoarthritis of knee 06/30/2012   Right >L.  Right unicompartmental knee arthroplasty 10/25/18  . CAD (coronary artery disease)   . Chronic LBP    Multiple surgeries  . COPD (chronic obstructive pulmonary disease) ([place])   . COVID-19 virus infection 06/02/2019   Eval at [facility] ED and d/c'd home.  Admitted 06/12/19 with hypoxic RF and bilat infiltrates.  . DDD (degenerative disc disease)    spinal stenosis  . Fatty liver   . History of GI bleed    NSAIDS  . Hyperlipidemia    mixed  . Microscopic hematuria    full w/ u unrevealing X 2  . Normal  nuclear stress test 11/11 and 06/2014   negative, EF normal  . Palpitations 2006   48H holter neg  . Prediabetes    Highest A1c 6.1% as of 03/2017  . RBBB (right bundle branch block)   . Recurrent UTI   . Seronegative rheumatoid arthritis ([place]) 10/2019   multiple sites->pred started, methotrex started 10/2019; rheum to add humira as of 03/2020  . Tobacco dependence in remission    Quit fall 2015.      Past Surgical History:   Procedure Laterality Date  . ABDOMINAL HYSTERECTOMY  1997   DUB  . APPENDECTOMY  1984  . CARDIAC CATHETERIZATION  10/09/2005   no CAD, mildly elevated LVEDP, normal LV function (Dr. Gerrie Nordmann)  . CARDIAC CATHETERIZATION N/A 05/16/2015   Mild non-obstructive CAD--med mgmt.  Procedure: Left Heart Cath and Coronary Angiography;  Surgeon: Pixie Casino, MD;  Location: [facility];  Service: Cardiovascular;  Laterality: N/A;  . CARDIOVASCULAR STRESS TEST  06/2014   normal lexiscan NST  . CARDIOVASCULAR STRESS TEST  2006   persantine - no ischemia, low risk   . KNEE ARTHROSCOPY Bilateral   . left wrist ganglion cyst excision  2010  . [place]   right iliac crest bone graft+metal instrumentation; 2005 metal removal and decompression, 2011 lumbar decompression 4-11, then stabilization/ instrumentation done 09-19-10: L2,L3, L5 left and L2 , L3, L4 right pedical remnant L4 left embedded. Left iliac crest bone graft-- Dr Velna Ochs  . LUMBAR SPINE SURGERY  02/10/2016   Dr. Velna Ochs: lumbar decompression, instrumentation removal, and fusion exploration--HELPED her a lot, esp her radicular leg pains.  . OOPHORECTOMY Right    cyst  . PARTIAL KNEE ARTHROPLASTY Right 11/04/2018   Procedure: UNICOMPARTMENTAL KNEE;  Surgeon: Renette Butters, MD;  Location: [facility];  Service: Orthopedics;  Laterality: Right;  Adductor Block  . TONSILLECTOMY  64 yrs old  . TONSILLECTOMY    . TRANSTHORACIC ECHOCARDIOGRAM  2006   EF=>55%, trace MR, mild TR, trace AV regurg, trace pulm valve regurg     Outpatient Medications Prior to Visit  Medication Sig Dispense Refill  . acetaminophen (TYLENOL) 500 MG tablet every 6 (six) hours as needed for mild pain or headache.    . Adalimumab (HUMIRA [place]) Inject 40 mg into the skin. Every other week    . clopidogrel (PLAVIX) 75 MG tablet Take 1 tablet (75 mg total) by mouth daily. 90 tablet 3  . cyclobenzaprine (FLEXERIL) 10 MG tablet take 1 tablet by mouth every  8 hours if needed 30 tablet 5  . fenofibrate 160 MG tablet take 1 tablet by mouth once daily with food 90 tablet 3  . folic acid (FOLVITE) 1 MG tablet Take 1 mg by mouth daily.     Marland Kitchen lisinopril (ZESTRIL) 5 MG tablet Take 1 tablet (5 mg total) by mouth daily. 90 tablet 1  . LORazepam (ATIVAN) 1 MG tablet Take 1 tablet (1 mg total) by mouth 2 (two) times daily. 60 tablet 5  . methotrexate 2.5 MG tablet Take 2.5 mg by mouth once a week. Pt takes 8 pills.    . pantoprazole (PROTONIX) 40 MG tablet Take 1 tablet (40 mg total) by mouth daily. 90 tablet 3  . predniSONE (DELTASONE) 5 MG tablet Take 5 mg by mouth daily.     . rosuvastatin (CRESTOR) 40 MG tablet Take 1 tablet (40 mg total) by mouth daily. 90 tablet 3  . sodium chloride (OCEAN) 0.65 %  SOLN nasal spray Place 1 spray into both nostrils as needed for congestion.    Marland Kitchen oxyCODONE (OXY IR/ROXICODONE) 5 MG immediate release tablet 1-2 po tid prn pain 180 tablet 0  . albuterol (VENTOLIN HFA) 108 (90 Base) MCG/ACT inhaler Inhale 2 puffs into the lungs every 4 (four) hours as needed for wheezing or shortness of breath (bronchitis). Reported on 03/12/2016 (Patient not taking: Reported on 04/11/2020) 18 g 1  . nitroGLYCERIN (NITROSTAT) 0.4 MG SL tablet Place 1 tablet (0.4 mg total) under the tongue every 5 (five) minutes as needed for chest pain (MAX 3 doses.). (Patient not taking: Reported on 12/28/2019) 10 tablet 1   No facility-administered medications prior to visit.    Allergies  Allergen Reactions  . Aspirin Other (See Comments)     GI Bleed  . Beta Adrenergic Blockers Other (See Comments)    REACTION: decreased libido  . Citalopram Nausea Only  . Nsaids Other (See Comments)     GI Upset  . Penicillins Swelling and Other (See Comments)    DID THE REACTION INVOLVE: Swelling of the face/tongue/throat, SOB, or low BP? Yes Sudden or severe rash/hives, skin peeling, or the inside of the mouth or nose? Yes Did it require medical treatment? Yes When  did it last happen? 64 years old If all above answers are "NO", may proceed with cephalosporin use.   Marland Kitchen Prochlorperazine Edisylate Other (See Comments)     Stroke like symptoms  . Sulfonamide Derivatives Other (See Comments)    Unknown allergic reaction  . Amitriptyline Hcl Palpitations and Other (See Comments)     increased heart rate    ROS As per HPI  PE: Vitals with BMI 07/18/2020 05/02/2020 04/11/2020  Height 5\' 4"  5\' 4"  -  Weight 222 lbs 6 oz 218 lbs 10 oz -  BMI 82.99 37.1 -  Systolic 696 789 381  Diastolic 72 79 77  Pulse 82 82 77  O2 sat on RA today is 93%   Gen: Alert, well appearing.  Patient is oriented to person, place, time, and situation. AFFECT: pleasant, lucid thought and speech. NO further exam today  LABS:  Lab Results  Component Value Date   TSH 0.42 09/24/2019   Lab Results  Component Value Date   WBC 10.5 05/02/2020   HGB 13.8 05/02/2020   HCT 41.9 05/02/2020   MCV 95.8 05/02/2020   PLT 217.0 05/02/2020   Lab Results  Component Value Date   CREATININE 0.73 05/02/2020   BUN 15 05/02/2020   NA 142 05/02/2020   K 4.6 05/02/2020   CL 102 05/02/2020   CO2 33 (H) 05/02/2020   Lab Results  Component Value Date   ALT 40 (H) 05/02/2020   AST 36 05/02/2020   ALKPHOS 47 05/02/2020   BILITOT 0.7 05/02/2020   Lab Results  Component Value Date   CHOL 137 05/02/2020   Lab Results  Component Value Date   HDL 48.50 05/02/2020   Lab Results  Component Value Date   LDLCALC 66 05/02/2020   Lab Results  Component Value Date   TRIG 110.0 05/02/2020   Lab Results  Component Value Date   CHOLHDL 3 05/02/2020   Lab Results  Component Value Date   HGBA1C 6.0 05/02/2020    IMPRESSION AND PLAN:  1) Chronic pain synd: DDD lumbar, bilat end stage knee DJD. Unchanged.  Responsible use of oxycodone.  CSC and UDS UTD. I did electronic rx's for oxycodone 5mg , 1-2 tid, #180 today for  Sept, Oct, and NOv.  Appropriate fill on/after date was noted on  each rx.  2) HTN: The current medical regimen is effective;  continue present plan and medications.  3) HLD: tolerating statin and fibrate.  FLP excellent 3 mo ago. Repeat 3-6 mo.  4) Anxiety, grief rxn: intol of citalopram (n/v).  This is improving. Continue lorazepam 1mg  bid prn.  No new rx for this needed today. CSC and UDS UTD.  5) Prediabetes: glucoses great.  Hba1c <3 mo ago 6%. Continue diet.  Unfortunately she has to stay on daily low dose prednisone for her RA for the unseen future.  6) Seroneg RA: now on humira and low dose prednisone.  Followed by rheum.  No labs due today.  An After Visit Summary was printed and given to the patient.  FOLLOW UP: No follow-ups on file.  Signed:  Crissie Sickles, MD           07/18/2020

## 2020-08-09 NOTE — Progress Notes (Signed)
Subjective:   Brenda [name] is a 64 y.o. female who presents for Medicare Annual (Subsequent) preventive examination.  I connected with Brenda [name] today by telephone and verified that I am speaking with the correct person using two identifiers. Location patient: home Location provider: work Persons participating in the virtual visit: patient, Marine scientist.    I discussed the limitations, risks, security and privacy concerns of performing an evaluation and management service by telephone and the availability of in person appointments. I also discussed with the patient that there may be a patient responsible charge related to this service. The patient expressed understanding and verbally consented to this telephonic visit.    Interactive audio and video telecommunications were attempted between this provider and patient, however failed, due to patient having technical difficulties OR patient did not have access to video capability.  We continued and completed visit with audio only.  Some vital signs may be absent or patient reported.   Time Spent with patient on telephone encounter: 25 minutes  Review of Systems     Cardiac Risk Factors include: dyslipidemia;obesity (BMI >30kg/m2);sedentary lifestyle     Objective:    Today's Vitals   08/10/20 1328  Weight: 222 lb (100.7 kg)  Height: 5\' 4"  (1.626 m)  PainSc: 6    Body mass index is 38.11 kg/m.  Advanced Directives 08/10/2020 06/14/2019 06/13/2019 06/02/2019 11/26/2018 11/19/2018 11/10/2018  Does Patient Have a Medical Advance Directive? Yes No No No No No No  Does patient want to make changes to medical advance directive? Yes (MAU/Ambulatory/Procedural Areas - Information given) - - - - - -  Would patient like information on creating a medical advance directive? - No - Patient declined - - - No - Patient declined No - Patient declined    Current Medications (verified) Outpatient Encounter Medications as of 08/10/2020  Medication Sig    acetaminophen (TYLENOL) 500 MG tablet every 6 (six) hours as needed for mild pain or headache.   Adalimumab (HUMIRA [place]) Inject 40 mg into the skin. Every other week   albuterol (VENTOLIN HFA) 108 (90 Base) MCG/ACT inhaler Inhale 2 puffs into the lungs every 4 (four) hours as needed for wheezing or shortness of breath (bronchitis). Reported on 03/12/2016   clopidogrel (PLAVIX) 75 MG tablet Take 1 tablet (75 mg total) by mouth daily.   cyclobenzaprine (FLEXERIL) 10 MG tablet take 1 tablet by mouth every 8 hours if needed   fenofibrate 160 MG tablet take 1 tablet by mouth once daily with food   folic acid (FOLVITE) 1 MG tablet Take 1 mg by mouth daily.    lisinopril (ZESTRIL) 5 MG tablet Take 1 tablet (5 mg total) by mouth daily.   LORazepam (ATIVAN) 1 MG tablet Take 1 tablet (1 mg total) by mouth 2 (two) times daily.   methotrexate 2.5 MG tablet Take 2.5 mg by mouth once a week. Pt takes 8 pills.   nitroGLYCERIN (NITROSTAT) 0.4 MG SL tablet Place 1 tablet (0.4 mg total) under the tongue every 5 (five) minutes as needed for chest pain (MAX 3 doses.).   oxyCODONE (OXY IR/ROXICODONE) 5 MG immediate release tablet 1-2 po tid prn pain   pantoprazole (PROTONIX) 40 MG tablet Take 1 tablet (40 mg total) by mouth daily.   predniSONE (DELTASONE) 5 MG tablet Take 5 mg by mouth daily.    rosuvastatin (CRESTOR) 40 MG tablet Take 1 tablet (40 mg total) by mouth daily.   sodium chloride (OCEAN) 0.65 % SOLN nasal spray Place  1 spray into both nostrils as needed for congestion.   No facility-administered encounter medications on file as of 08/10/2020.    Allergies (verified) Aspirin, Beta adrenergic blockers, Citalopram, Nsaids, Penicillins, Prochlorperazine edisylate, Sulfonamide derivatives, and Amitriptyline hcl   History: Past Medical History:  Diagnosis Date   Aortic valve stenosis    "very mild"   Atypical chest pain 2007; 2015   cardiolyte neg, echo nl, cath showed mild/nonobstructive  LAD disease   Bilateral primary osteoarthritis of knee 06/30/2012   Right >L.  Right unicompartmental knee arthroplasty 10/25/18   CAD (coronary artery disease)    Chronic LBP    Multiple surgeries   COPD (chronic obstructive pulmonary disease) ([place])    COVID-19 virus infection 06/02/2019   Eval at [facility] ED and d/c'd home.  Admitted 06/12/19 with hypoxic RF and bilat infiltrates.   DDD (degenerative disc disease)    spinal stenosis   Fatty liver    History of GI bleed    NSAIDS   Hyperlipidemia    mixed   Microscopic hematuria    full w/ u unrevealing X 2   Normal nuclear stress test 11/11 and 06/2014   negative, EF normal   Palpitations 2006   48H holter neg   Prediabetes    Highest A1c 6.1% as of 03/2017   RBBB (right bundle branch block)    Recurrent UTI    Seronegative rheumatoid arthritis ([place]) 10/2019   multiple sites->pred started, methotrex started 10/2019; rheum to add humira as of 03/2020   Tobacco dependence in remission    Quit fall 2015.     Past Surgical History:  Procedure Laterality Date   ABDOMINAL HYSTERECTOMY  1997   DUB   APPENDECTOMY  1984   CARDIAC CATHETERIZATION  10/09/2005   no CAD, mildly elevated LVEDP, normal LV function (Dr. Gerrie Nordmann)   CARDIAC CATHETERIZATION N/A 05/16/2015   Mild non-obstructive CAD--med mgmt.  Procedure: Left Heart Cath and Coronary Angiography;  Surgeon: Pixie Casino, MD;  Location: [facility];  Service: Cardiovascular;  Laterality: N/A;   CARDIOVASCULAR STRESS TEST  06/2014   normal lexiscan NST   CARDIOVASCULAR STRESS TEST  2006   persantine - no ischemia, low risk    KNEE ARTHROSCOPY Bilateral    left wrist ganglion cyst excision  2010   [place]   right iliac crest bone graft+metal instrumentation; 2005 metal removal and decompression, 2011 lumbar decompression 4-11, then stabilization/ instrumentation done 09-19-10: L2,L3, L5 left and L2 , L3, L4 right pedical remnant  L4 left embedded. Left iliac crest bone graft-- Dr Zollie Pee SPINE SURGERY  02/10/2016   Dr. Velna Ochs: lumbar decompression, instrumentation removal, and fusion exploration--HELPED her a lot, esp her radicular leg pains.   OOPHORECTOMY Right    cyst   PARTIAL KNEE ARTHROPLASTY Right 11/04/2018   Procedure: UNICOMPARTMENTAL KNEE;  Surgeon: Renette Butters, MD;  Location: [facility];  Service: Orthopedics;  Laterality: Right;  Adductor Block   TONSILLECTOMY  64 yrs old   TONSILLECTOMY     TRANSTHORACIC ECHOCARDIOGRAM  2006   EF=>55%, trace MR, mild TR, trace AV regurg, trace pulm valve regurg    Family History  Problem Relation Age of Onset   Heart Problems Mother        and thyroid problems   Hyperlipidemia Mother    Diabetes Mother    Breast cancer Mother    Heart failure Father        CHF,  heart attack, diabetes, hyperlipidemia   Heart disease Brother        back problems   Hypertension Brother    Kidney failure Maternal Grandmother    Stroke Maternal Grandfather    Cancer Paternal Grandmother        mouth - snuff   Heart attack Paternal Grandfather        stroke, HTN   Hyperlipidemia Brother    Heart attack Brother    Cancer Brother    Coronary artery disease Neg Hx    Social History   Socioeconomic History   Marital status: Married    Spouse name: Not on file   Number of children: Not on file   Years of education: Not on file   Highest education level: Not on file  Occupational History   Not on file  Tobacco Use   Smoking status: Former Smoker    Packs/day: 1.00    Years: 35.00    Pack years: 35.00    Types: Cigarettes    Quit date: 07/22/2014    Years since quitting: 6.0   Smokeless tobacco: Never Used   Tobacco comment: down to ~2 cigarettes/daily (06/23/14) - Quit Smoking around 07/20/14!  Vaping Use   Vaping Use: Never used  Substance and Sexual Activity   Alcohol use: No    Alcohol/week: 0.0 standard drinks   Drug use:  No   Sexual activity: Yes    Partners: Male    Birth control/protection: Surgical  Other Topics Concern   Not on file  Social History Narrative   Not on file   Social Determinants of Health   Financial Resource Strain: Low Risk    Difficulty of Paying Living Expenses: Not hard at all  Food Insecurity: No Food Insecurity   Worried About Charity fundraiser in the Last Year: Never true   [place] in the Last Year: Never true  Transportation Needs: No Transportation Needs   Lack of Transportation (Medical): No   Lack of Transportation (Non-Medical): No  Physical Activity: Inactive   Days of Exercise per Week: 0 days   Minutes of Exercise per Session: 0 min  Stress: No Stress Concern Present   Feeling of Stress : Not at all  Social Connections: Moderately Integrated   Frequency of Communication with Friends and Family: More than three times a week   Frequency of Social Gatherings with Friends and Family: More than three times a week   Attends Religious Services: 1 to 4 times per year   Active Member of [employer] or Organizations: No   Attends Archivist Meetings: Never   Marital Status: Married    Tobacco Counseling Counseling given: Not Answered Comment: down to ~2 cigarettes/daily (06/23/14) - Quit Smoking around 07/20/14!   Clinical Intake:  Pre-visit preparation completed: Yes  Pain : 0-10 Pain Score: 6  Pain Type: Chronic pain Pain Location: Generalized (rheumatoid arthritis) Pain Onset: More than a month ago Pain Frequency: Constant Pain Relieving Factors: prescription pain med  Pain Relieving Factors: prescription pain med  Nutritional Status: BMI > 30  Obese Nutritional Risks: None Diabetes: No  How often do you need to have someone help you when you read instructions, pamphlets, or other written materials from your doctor or pharmacy?: 1 - Never What is the last grade level you completed in school?:  GED  Diabetic?No  Interpreter Needed?: No  Information entered by :: Caroleen Hamman LPN   Activities of Daily Living In your present  state of health, do you have any difficulty performing the following activities: 08/10/2020  Hearing? N  Vision? N  Difficulty concentrating or making decisions? N  Walking or climbing stairs? N  Dressing or bathing? N  Doing errands, shopping? N  Preparing Food and eating ? N  Using the Toilet? N  In the past six months, have you accidently leaked urine? N  Do you have problems with loss of bowel control? N  Managing your Medications? N  Managing your Finances? N  Housekeeping or managing your Housekeeping? Y  Comment husband helps  Some recent data might be hidden    Patient Care Team: Tammi Sou, MD as PCP - General Hilty, Nadean Corwin, MD as PCP - Cardiology (Cardiology) Debara Pickett Nadean Corwin, MD as Consulting Physician (Cardiology) Derian, Nicholaus Corolla, MD (Orthopedic Surgery) Renette Butters, MD as Consulting Physician (Orthopedic Surgery) Lahoma Rocker, MD as Consulting Physician (Rheumatology)  Indicate any recent Medical Services you may have received from other than [facility] providers in the past year (date may be approximate).     Assessment:   This is a routine wellness examination for Silvana.  Hearing/Vision screen  Hearing Screening   125Hz  250Hz  500Hz  1000Hz  2000Hz  3000Hz  4000Hz  6000Hz  8000Hz   Right ear:           Left ear:           Comments: No issues  Vision Screening Comments: Wears glasses Last eye exam-2020-[facility]  Dietary issues and exercise activities discussed: Current Exercise Habits: The patient does not participate in regular exercise at present, Exercise limited by: orthopedic condition(s)  Goals     Weight (lb) < 180 lb (81.6 kg)     Lose weight by increase walking.       Depression Screen PHQ 2/9 Scores 08/10/2020 12/28/2019 12/17/2018 03/06/2018 06/06/2017 06/06/2017  PHQ - 2 Score 0 0 0 0 0  0  PHQ- 9 Score - - - 0 - -    Fall Risk Fall Risk  08/10/2020 06/06/2017  Falls in the past year? 1 No  Number falls in past yr: 0 -  Injury with Fall? 0 -  Risk for fall due to : History of fall(s) -  Follow up Falls prevention discussed -    Any stairs in or around the home? Yes  If so, are there any without handrails? No  Home free of loose throw rugs in walkways, pet beds, electrical cords, etc? Yes  Adequate lighting in your home to reduce risk of falls? Yes   ASSISTIVE DEVICES UTILIZED TO PREVENT FALLS:  Life alert? No  Use of a cane, walker or w/c? Yes walker Grab bars in the bathroom? Yes  Shower chair or bench in shower? No  Elevated toilet seat or a handicapped toilet? No   TIMED UP AND GO:  Was the test performed? No . Phone visit   Cognitive Function:No cognitive impairment noted.        Immunizations Immunization History  Administered Date(s) Administered   Influenza Split 10/25/2011   Influenza, Seasonal, Injecte, Preservative Fre 10/30/2012   Influenza,inj,Quad PF,6+ Mos 11/12/2013, 07/14/2014, 07/14/2015, 09/13/2016, 09/05/2017, 08/21/2018, 09/24/2019   Pneumococcal Polysaccharide-23 02/21/2012, 06/06/2017   Tdap 10/25/2011    TDAP status: Up to date   Flu vaccine status: Due-Patient plans to get vaccine soon.  Pneumococcal vaccine status: Up to date   Covid-19 vaccine status: Declined, Education has been provided regarding the importance of this vaccine but patient still declined. Advised may receive  this vaccine at local pharmacy or Health Dept.or vaccine clinic. Aware to provide a copy of the vaccination record if obtained from local pharmacy or Health Dept. Verbalized acceptance and understanding.  Qualifies for Shingles Vaccine? Yes   Zostavax completed No   Shingrix Completed?: No.    Education has been provided regarding the importance of this vaccine. Patient has been advised to call insurance company to determine out of pocket expense  if they have not yet received this vaccine. Advised may also receive vaccine at local pharmacy or Health Dept. Verbalized acceptance and understanding.  Screening Tests Health Maintenance  Topic Date Due   MAMMOGRAM  Never done   COLONOSCOPY  Never done   COVID-19 Vaccine (1) 10/17/2020 (Originally 06/08/1968)   INFLUENZA VACCINE  02/02/2021 (Originally 06/05/2020)   TETANUS/TDAP  10/24/2021   Hepatitis C Screening  Completed   HIV Screening  Completed    Health Maintenance  Health Maintenance Due  Topic Date Due   MAMMOGRAM  Never done   COLONOSCOPY  Never done    Colorectal Cancer Screening: Declined  Mammogram status: Declined   Lung Cancer Screening: (Low Dose CT Chest recommended if Age 49-80 years, 30 pack-year currently smoking OR have quit w/in 15years.) does qualify.   Lung Cancer Screening Referral: Discuss with PCP  Additional Screening:  Hepatitis C Screening: Completed 04/01/2016  Vision Screening: Recommended annual ophthalmology exams for early detection of glaucoma and other disorders of the eye. Is the patient up to date with their annual eye exam?  Yes  Who is the provider or what is the name of the office in which the patient attends annual eye exams? [facility]   Dental Screening: Recommended annual dental exams for proper oral hygiene  Community Resource Referral / Chronic Care Management: CRR required this visit?  No   CCM required this visit?  No      Plan:     I have personally reviewed and noted the following in the patients chart:    Medical and social history  Use of alcohol, tobacco or illicit drugs   Current medications and supplements  Functional ability and status  Nutritional status  Physical activity  Advanced directives  List of other physicians  Hospitalizations, surgeries, and ER visits in previous 12 months  Vitals  Screenings to include cognitive, depression, and falls  Referrals and  appointments  In addition, I have reviewed and discussed with patient certain preventive protocols, quality metrics, and best practice recommendations. A written personalized care plan for preventive services as well as general preventive health recommendations were provided to patient.   Due to this being a telephonic visit, the after visit summary with patients personalized plan was offered to patient via mail or my-chart. Patient would like to access on my-chart.   Marta Antu, LPN   [id number]/01/7901  Nurse Health Advisor  Nurse Notes: None

## 2020-08-10 ENCOUNTER — Ambulatory Visit (INDEPENDENT_AMBULATORY_CARE_PROVIDER_SITE_OTHER): Payer: Medicare Other

## 2020-08-10 VITALS — Ht 64.0 in | Wt 222.0 lb

## 2020-08-10 DIAGNOSIS — Z0000 Encounter for general adult medical examination without abnormal findings: Secondary | ICD-10-CM | POA: Diagnosis not present

## 2020-08-10 NOTE — Patient Instructions (Signed)
Brenda [name] , Thank you for taking time to complete your Medicare Wellness Visit. I appreciate your ongoing commitment to your health goals. Please review the following plan we discussed and let me know if I can assist you in the future.   Screening recommendations/referrals: Colonoscopy: Declined Mammogram: Declined Bone Density: Not yet indicated Recommended yearly ophthalmology/optometry visit for glaucoma screening and checkup Recommended yearly dental visit for hygiene and checkup  Vaccinations: Influenza vaccine: Due Pneumococcal vaccine:Up to Date Tdap vaccine: Up to Date- Due-10/24/2021 Shingles vaccine: Discuss with pharmacy  Covid-19: Declined  Advanced directives: Information mailed today  Conditions/risks identified: See problem list  Next appointment: Follow up in one year for your annual wellness visit.  08/16/2021 @ 2:15pm  Preventive Care 40-64 Years, Female Preventive care refers to lifestyle choices and visits with your health care provider that can promote health and wellness. What does preventive care include?  A yearly physical exam. This is also called an annual well check.  Dental exams once or twice a year.  Routine eye exams. Ask your health care provider how often you should have your eyes checked.  Personal lifestyle choices, including:  Daily care of your teeth and gums.  Regular physical activity.  Eating a healthy diet.  Avoiding tobacco and drug use.  Limiting alcohol use.  Practicing safe sex.  Taking low-dose aspirin daily starting at age 50.  Taking vitamin and mineral supplements as recommended by your health care provider. What happens during an annual well check? The services and screenings done by your health care provider during your annual well check will depend on your age, overall health, lifestyle risk factors, and family history of disease. Counseling  Your health care provider may ask you questions about  your:  Alcohol use.  Tobacco use.  Drug use.  Emotional well-being.  Home and relationship well-being.  Sexual activity.  Eating habits.  Work and work Statistician.  Method of birth control.  Menstrual cycle.  Pregnancy history. Screening  You may have the following tests or measurements:  Height, weight, and BMI.  Blood pressure.  Lipid and cholesterol levels. These may be checked every 5 years, or more frequently if you are over 39 years old.  Skin check.  Lung cancer screening. You may have this screening every year starting at age 73 if you have a 30-pack-year history of smoking and currently smoke or have quit within the past 15 years.  Fecal occult blood test (FOBT) of the stool. You may have this test every year starting at age 24.  Flexible sigmoidoscopy or colonoscopy. You may have a sigmoidoscopy every 5 years or a colonoscopy every 10 years starting at age 47.  Hepatitis C blood test.  Hepatitis B blood test.  Sexually transmitted disease (STD) testing.  Diabetes screening. This is done by checking your blood sugar (glucose) after you have not eaten for a while (fasting). You may have this done every 1-3 years.  Mammogram. This may be done every 1-2 years. Talk to your health care provider about when you should start having regular mammograms. This may depend on whether you have a family history of breast cancer.  BRCA-related cancer screening. This may be done if you have a family history of breast, ovarian, tubal, or peritoneal cancers.  Pelvic exam and Pap test. This may be done every 3 years starting at age 42. Starting at age 44, this may be done every 5 years if you have a Pap test in combination with an HPV  test.  Bone density scan. This is done to screen for osteoporosis. You may have this scan if you are at high risk for osteoporosis. Discuss your test results, treatment options, and if necessary, the need for more tests with your health care  provider. Vaccines  Your health care provider may recommend certain vaccines, such as:  Influenza vaccine. This is recommended every year.  Tetanus, diphtheria, and acellular pertussis (Tdap, Td) vaccine. You may need a Td booster every 10 years.  Zoster vaccine. You may need this after age 43.  Pneumococcal 13-valent conjugate (PCV13) vaccine. You may need this if you have certain conditions and were not previously vaccinated.  Pneumococcal polysaccharide (PPSV23) vaccine. You may need one or two doses if you smoke cigarettes or if you have certain conditions. Talk to your health care provider about which screenings and vaccines you need and how often you need them. This information is not intended to replace advice given to you by your health care provider. Make sure you discuss any questions you have with your health care provider. Document Released: 11/18/2015 Document Revised: 07/11/2016 Document Reviewed: 08/23/2015 Elsevier Interactive Patient Education  2017 [place] Prevention in the Home Falls can cause injuries. They can happen to people of all ages. There are many things you can do to make your home safe and to help prevent falls. What can I do on the outside of my home?  Regularly fix the edges of walkways and driveways and fix any cracks.  Remove anything that might make you trip as you walk through a door, such as a raised step or threshold.  Trim any bushes or trees on the path to your home.  Use bright outdoor lighting.  Clear any walking paths of anything that might make someone trip, such as rocks or tools.  Regularly check to see if handrails are loose or broken. Make sure that both sides of any steps have handrails.  Any raised decks and porches should have guardrails on the edges.  Have any leaves, snow, or ice cleared regularly.  Use sand or salt on walking paths during winter.  Clean up any spills in your garage right away. This includes  oil or grease spills. What can I do in the bathroom?  Use night lights.  Install grab bars by the toilet and in the tub and shower. Do not use towel bars as grab bars.  Use non-skid mats or decals in the tub or shower.  If you need to sit down in the shower, use a plastic, non-slip stool.  Keep the floor dry. Clean up any water that spills on the floor as soon as it happens.  Remove soap buildup in the tub or shower regularly.  Attach bath mats securely with double-sided non-slip rug tape.  Do not have throw rugs and other things on the floor that can make you trip. What can I do in the bedroom?  Use night lights.  Make sure that you have a light by your bed that is easy to reach.  Do not use any sheets or blankets that are too big for your bed. They should not hang down onto the floor.  Have a firm chair that has side arms. You can use this for support while you get dressed.  Do not have throw rugs and other things on the floor that can make you trip. What can I do in the kitchen?  Clean up any spills right away.  Avoid  walking on wet floors.  Keep items that you use a lot in easy-to-reach places.  If you need to reach something above you, use a strong step stool that has a grab bar.  Keep electrical cords out of the way.  Do not use floor polish or wax that makes floors slippery. If you must use wax, use non-skid floor wax.  Do not have throw rugs and other things on the floor that can make you trip. What can I do with my stairs?  Do not leave any items on the stairs.  Make sure that there are handrails on both sides of the stairs and use them. Fix handrails that are broken or loose. Make sure that handrails are as long as the stairways.  Check any carpeting to make sure that it is firmly attached to the stairs. Fix any carpet that is loose or worn.  Avoid having throw rugs at the top or bottom of the stairs. If you do have throw rugs, attach them to the floor  with carpet tape.  Make sure that you have a light switch at the top of the stairs and the bottom of the stairs. If you do not have them, ask someone to add them for you. What else can I do to help prevent falls?  Wear shoes that:  Do not have high heels.  Have rubber bottoms.  Are comfortable and fit you well.  Are closed at the toe. Do not wear sandals.  If you use a stepladder:  Make sure that it is fully opened. Do not climb a closed stepladder.  Make sure that both sides of the stepladder are locked into place.  Ask someone to hold it for you, if possible.  Clearly mark and make sure that you can see:  Any grab bars or handrails.  First and last steps.  Where the edge of each step is.  Use tools that help you move around (mobility aids) if they are needed. These include:  Canes.  Walkers.  Scooters.  Crutches.  Turn on the lights when you go into a dark area. Replace any light bulbs as soon as they burn out.  Set up your furniture so you have a clear path. Avoid moving your furniture around.  If any of your floors are uneven, fix them.  If there are any pets around you, be aware of where they are.  Review your medicines with your doctor. Some medicines can make you feel dizzy. This can increase your chance of falling. Ask your doctor what other things that you can do to help prevent falls. This information is not intended to replace advice given to you by your health care provider. Make sure you discuss any questions you have with your health care provider. Document Released: 08/18/2009 Document Revised: 03/29/2016 Document Reviewed: 11/26/2014 Elsevier Interactive Patient Education  2017 [employer].

## 2020-09-06 DIAGNOSIS — E669 Obesity, unspecified: Secondary | ICD-10-CM | POA: Diagnosis not present

## 2020-09-06 DIAGNOSIS — M7989 Other specified soft tissue disorders: Secondary | ICD-10-CM | POA: Diagnosis not present

## 2020-09-06 DIAGNOSIS — M549 Dorsalgia, unspecified: Secondary | ICD-10-CM | POA: Diagnosis not present

## 2020-09-06 DIAGNOSIS — J449 Chronic obstructive pulmonary disease, unspecified: Secondary | ICD-10-CM | POA: Diagnosis not present

## 2020-09-06 DIAGNOSIS — Z79899 Other long term (current) drug therapy: Secondary | ICD-10-CM | POA: Diagnosis not present

## 2020-09-06 DIAGNOSIS — M79643 Pain in unspecified hand: Secondary | ICD-10-CM | POA: Diagnosis not present

## 2020-09-06 DIAGNOSIS — M0609 Rheumatoid arthritis without rheumatoid factor, multiple sites: Secondary | ICD-10-CM | POA: Diagnosis not present

## 2020-09-06 DIAGNOSIS — M79673 Pain in unspecified foot: Secondary | ICD-10-CM | POA: Diagnosis not present

## 2020-09-06 DIAGNOSIS — I2510 Atherosclerotic heart disease of native coronary artery without angina pectoris: Secondary | ICD-10-CM | POA: Diagnosis not present

## 2020-09-06 DIAGNOSIS — M1990 Unspecified osteoarthritis, unspecified site: Secondary | ICD-10-CM | POA: Diagnosis not present

## 2020-09-06 DIAGNOSIS — Z13820 Encounter for screening for osteoporosis: Secondary | ICD-10-CM | POA: Diagnosis not present

## 2020-09-06 DIAGNOSIS — Z23 Encounter for immunization: Secondary | ICD-10-CM | POA: Diagnosis not present

## 2020-09-06 LAB — BASIC METABOLIC PANEL
BUN: 14 (ref 4–21)
CO2: 30 — AB (ref 13–22)
Chloride: 106 (ref 99–108)
Creatinine: 0.6 (ref 0.5–1.1)
Glucose: 106
Potassium: 5.4 — AB (ref 3.4–5.3)
Sodium: 144 (ref 137–147)

## 2020-09-06 LAB — HEPATIC FUNCTION PANEL
ALT: 70 — AB (ref 7–35)
AST: 78 — AB (ref 13–35)
Alkaline Phosphatase: 69 (ref 25–125)
Bilirubin, Total: 0.5

## 2020-09-06 LAB — CBC AND DIFFERENTIAL
HCT: 44 (ref 36–46)
Hemoglobin: 14.6 (ref 12.0–16.0)
Platelets: 239 (ref 150–399)
WBC: 11.4

## 2020-09-06 LAB — COMPREHENSIVE METABOLIC PANEL
Albumin: 4.1 (ref 3.5–5.0)
Calcium: 9.4 (ref 8.7–10.7)
GFR calc Af Amer: 128.99
GFR calc non Af Amer: 106.6
Globulin: 3.2

## 2020-09-06 LAB — CBC: RBC: 4.52 (ref 3.87–5.11)

## 2020-09-13 ENCOUNTER — Other Ambulatory Visit: Payer: Self-pay | Admitting: Family Medicine

## 2020-09-14 ENCOUNTER — Other Ambulatory Visit: Payer: Self-pay | Admitting: Family Medicine

## 2020-09-18 IMAGING — CR PORTABLE CHEST - 1 VIEW
1 series · 1 of 1 positions shown · non-contrast
Comparison: 06/02/2019

CLINICAL DATA: Shortness of breath. UVMDF-4O positive. Fever and
chills.

EXAM:
PORTABLE CHEST 1 VIEW

[portable]
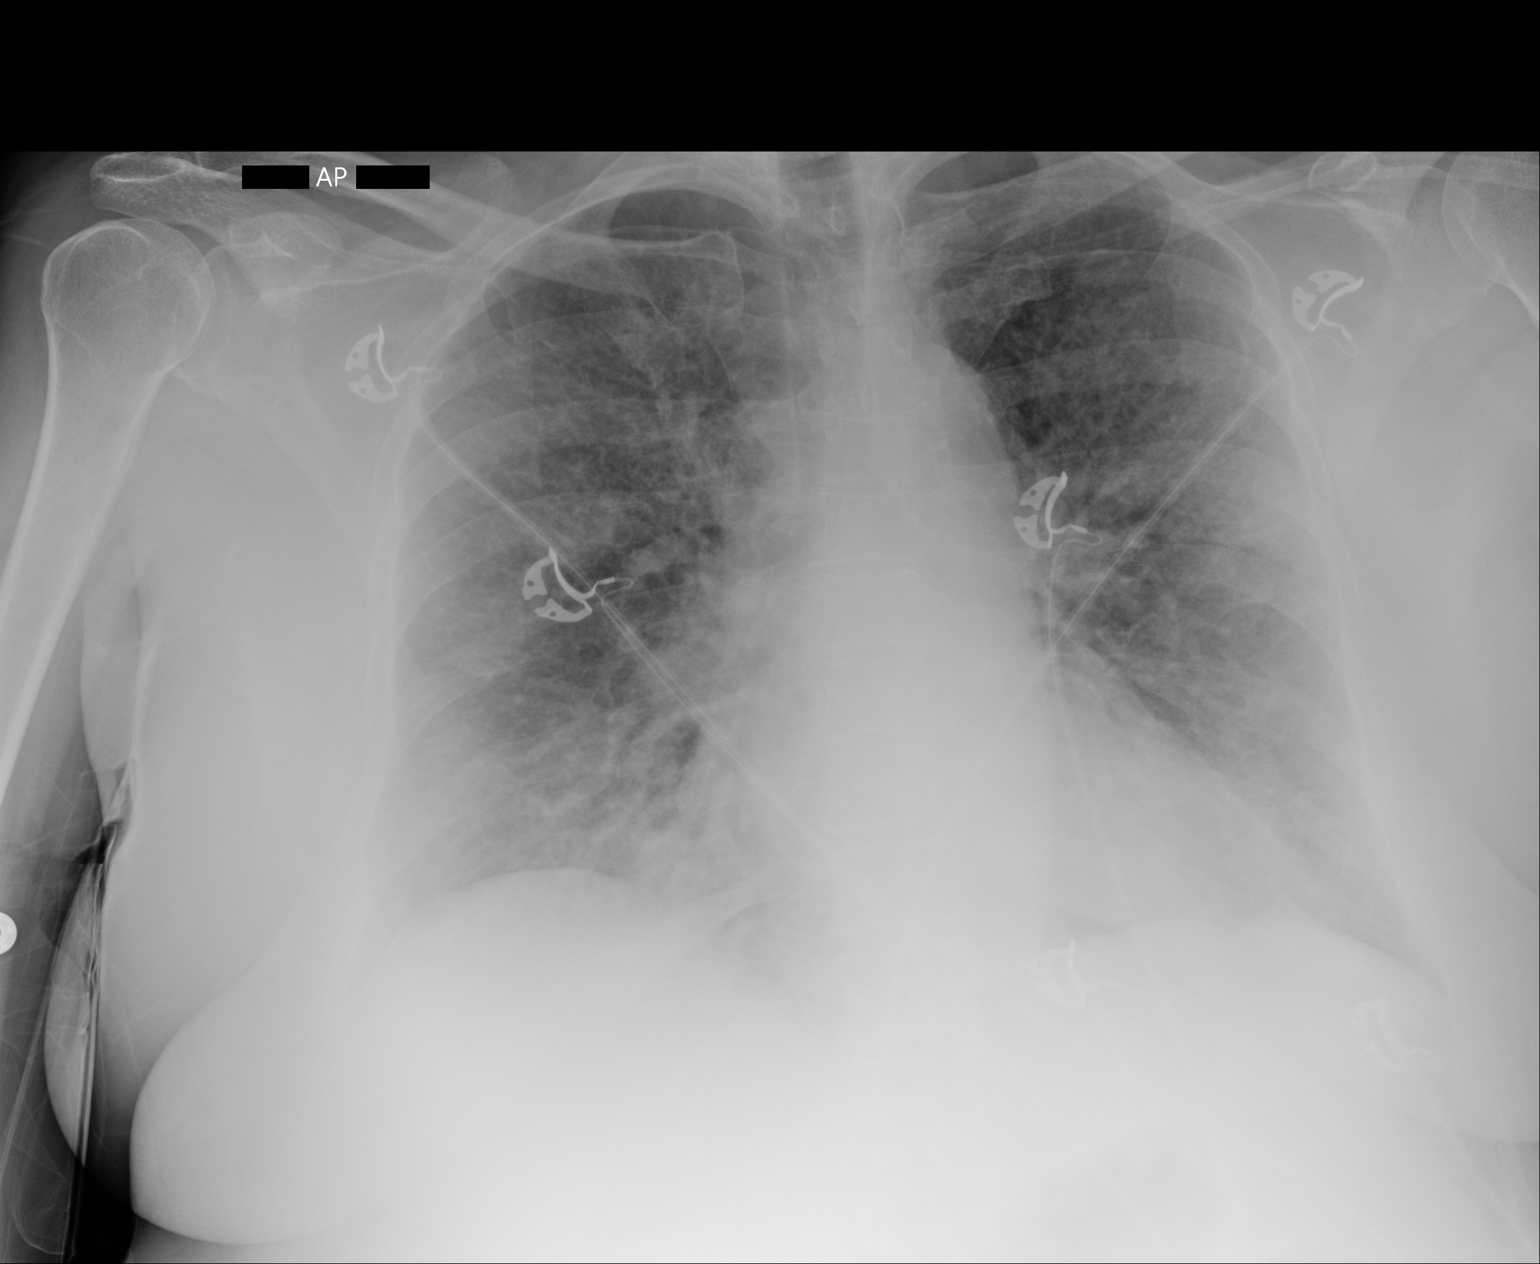

[1 of 1 positions shown; findings below may reference images not displayed]

FINDINGS: Midline trachea. Normal heart size. No pleural effusion or
pneumothorax. Development of pulmonary interstitial prominence with
interstitial opacities bilaterally. No well-defined lobar
consolidation.
IMPRESSION: Development of relatively diffuse interstitial opacities which,
given the clinical history are suspicious for atypical/viral
pneumonia. In the appropriate clinical setting, mild pulmonary
venous congestion could look similar.

## 2020-09-23 ENCOUNTER — Telehealth: Payer: Self-pay | Admitting: Family Medicine

## 2020-09-23 ENCOUNTER — Other Ambulatory Visit: Payer: Self-pay

## 2020-09-23 MED ORDER — LORAZEPAM 1 MG PO TABS
1.0000 mg | ORAL_TABLET | Freq: Two times a day (BID) | ORAL | 5 refills | Status: DC
Start: 2020-09-23 — End: 2021-03-29

## 2020-09-23 NOTE — Telephone Encounter (Addendum)
[facility] - Client TELEPHONE ADVICE RECORD [facility] Patient Name: Brenda [name] Gender: Female DOB: [birth year] Age: 64 Y 44 M 14 D Return Phone Number: [phone] (Primary) Address: [place] [address] Client [place] [facility] - Client Client Site [place] [facility] Physician Crissie Sickles - MD Contact Type Call Who Is Calling Patient / Member / Family / Caregiver Call Type Triage / Clinical Relationship To Patient Self Return Phone Number [phone] (Primary) Chief Complaint Prescription Refill or Medication Request (non symptomatic) Reason for Call Symptomatic / Request for Health Information Initial Comment She is needing a refill on her Lorazepam. Translation No Nurse Assessment Nurse: Toy Cookey, RN, Stanton Kidney Date/Time (Eastern Time): 09/22/2020 4:18:13 PM Confirm and document reason for call. If symptomatic, describe symptoms. ---She is needing a refill on her Lorazepam. She is out and has been since the 16th. Uses [facility] on the [address]. She stated the pharmacy was supposed to send in the refill request also. Notified patient of refill request being sent to office and encouraged patient to f/u with office when open. Denies need for triage. Does the patient have any new or worsening symptoms? ---No Disp. Time Eilene Ghazi Time) Disposition Final User 09/22/2020 4:20:47 PM Clinical Call Yes Toy Cookey, RN, Shea Stakes [facility] - Client Nonclinical Telephone Record  [facility] Client [facility] - Client Client Site [facility] Day Physician Crissie Sickles - MD Contact Type Call Who Is Calling Patient / Member / Family / Caregiver Caller Name Brenda [name] Caller Phone Number [phone] Patient Name Brenda [name] Patient DOB 03/01/1956 Call Type Message Only Information Provided Reason for Call Medication Question /  Request Initial Comment Caller states she has been out of her medication since the 16th and the pharmacy tells her that the doctor has not sent an order in. Additional Comment Caller declined triage, provided office hours. Disp. Time Disposition Final User 09/22/2020 2:01:10 PM General Information Provided Yes Mockler, Tiffany Call Closed By: Marlou Sa Transaction Date/Time: 09/22/2020 1:58:22 PM (ET)

## 2020-09-23 NOTE — Telephone Encounter (Signed)
Requesting: lorazepam   Contract:12/28/19 UDS:12/28/19 Last Visit:07/18/20 Next Visit:10/17/20 Last Refill:03/14/20 (60,5)  Please Advise, medication pending

## 2020-09-23 NOTE — Telephone Encounter (Signed)
After hours message: Patient requesting refill of lorazepam. States she has been out since the 16th. Please send to same [facility] [address]

## 2020-09-24 NOTE — Telephone Encounter (Signed)
Patient advised medication refill sent.

## 2020-10-17 ENCOUNTER — Other Ambulatory Visit: Payer: Self-pay | Admitting: Family Medicine

## 2020-10-17 ENCOUNTER — Ambulatory Visit (INDEPENDENT_AMBULATORY_CARE_PROVIDER_SITE_OTHER): Payer: Medicare Other | Admitting: Family Medicine

## 2020-10-17 ENCOUNTER — Other Ambulatory Visit: Payer: Self-pay

## 2020-10-17 ENCOUNTER — Encounter: Payer: Self-pay | Admitting: Family Medicine

## 2020-10-17 VITALS — BP 112/73 | HR 92 | Temp 97.9°F | Resp 16 | Ht 64.0 in | Wt 216.2 lb

## 2020-10-17 DIAGNOSIS — G894 Chronic pain syndrome: Secondary | ICD-10-CM | POA: Diagnosis not present

## 2020-10-17 DIAGNOSIS — R110 Nausea: Secondary | ICD-10-CM

## 2020-10-17 DIAGNOSIS — R1011 Right upper quadrant pain: Secondary | ICD-10-CM | POA: Diagnosis not present

## 2020-10-17 DIAGNOSIS — E7800 Pure hypercholesterolemia, unspecified: Secondary | ICD-10-CM

## 2020-10-17 DIAGNOSIS — R7401 Elevation of levels of liver transaminase levels: Secondary | ICD-10-CM | POA: Diagnosis not present

## 2020-10-17 DIAGNOSIS — J449 Chronic obstructive pulmonary disease, unspecified: Secondary | ICD-10-CM | POA: Diagnosis not present

## 2020-10-17 DIAGNOSIS — J209 Acute bronchitis, unspecified: Secondary | ICD-10-CM

## 2020-10-17 DIAGNOSIS — I2510 Atherosclerotic heart disease of native coronary artery without angina pectoris: Secondary | ICD-10-CM | POA: Diagnosis not present

## 2020-10-17 DIAGNOSIS — R7303 Prediabetes: Secondary | ICD-10-CM

## 2020-10-17 DIAGNOSIS — M170 Bilateral primary osteoarthritis of knee: Secondary | ICD-10-CM | POA: Diagnosis not present

## 2020-10-17 MED ORDER — ALBUTEROL SULFATE HFA 108 (90 BASE) MCG/ACT IN AERS: 2.0000 | INHALATION_SPRAY | RESPIRATORY_TRACT | 1 refills | Status: DC | PRN

## 2020-10-17 MED ORDER — OXYCODONE HCL 5 MG PO TABS: ORAL_TABLET | ORAL | 0 refills | Status: DC

## 2020-10-17 MED ORDER — OXYCODONE HCL 5 MG PO TABS
ORAL_TABLET | ORAL | 0 refills | Status: DC
Start: 2020-10-17 — End: 2020-11-25

## 2020-10-17 MED ORDER — CYCLOBENZAPRINE HCL 10 MG PO TABS: ORAL_TABLET | ORAL | 3 refills | Status: DC

## 2020-10-17 NOTE — Progress Notes (Signed)
See student note from this date. I personally was present during the history, physical exam, and medical decision-making activities of this service and have verified that the service and findings are accurately documented in the student's note. Signed:  Phil Macyn Remmert, MD           10/17/2020  

## 2020-10-17 NOTE — Progress Notes (Addendum)
CC: 3 month f/u chronic illnesses  HPI:  Brenda [name] is a 64 yo female who presents to the clinic today for 3 month f/u for chronic pain syndrome, HTN, HLD, GAD, and prediabetes.  [name] reports 3 weeks of having a "rattling" feeling in her chest. This occurs during the morning and at night. She reports that this is associated with a cough that occasionally produces white mucous. The patient states that she uses her albuterol inhaler for these episodes with good effect. She has had to use her PRN albuterol inhaler 2 times per day for the past three weeks. The patient reports mild SOB during these episodes. Patient denies fever, chills, chest pain, palpitations.  Additionally, the patient endorses decreased appetite and nausea for the past two weeks. She states that she does not have much of an appetite and feels nauseated whenever she thinks about food. The nausea is not triggered by any particular agent/food. She has not found anything that alleviates the nausea. Patient reports that her R abdomen will occasionally cause some pain (upper and lower quadrants). She describes the pain as feeling "sore." Patient denies any fever, chills, vomiting, diarrhea, or constipation.  Chronic pain syndrome: Indication for chronic opioid:chronic bilat LBP w/out sciatica, and chronic pain from bilat osteoarthritis knees. Medication and dose:oxycodone 5mg , 1-2 tid prn. # pills per month: #180 Last UDS date:12/28/19 (approp results) Opioid Treatment Agreement signed (Y/N):Y, 12/28/19. Opioid Treatment Agreement last reviewed with patient:today PMP AWARE reviewed today: most recent rx for oxycodone was filled 09/26/20, # 180, rx by me. Most recent loraz 1mg  rx filled 09/23/20, #60, rx by me. No red flags. Patient feels as though her chronic pain is being well managed. She does not endorse intolerable breakthrough pain. Additionally, she reports that her arthritic pain has improved since starting humira.  Patient reports that she is undergoing 2 week trial off prednisone per her rheumatologist recommendations.  Patient's home BP readings in the 850 -277 systolic range.  Patient feels as though her anxiety is being well managed. Patient denies any recent panic attacks, or increasing levels of anxiety.     PMH: Past Medical History:  Diagnosis Date  . Aortic valve stenosis    "very mild"  . Atypical chest pain 2007; 2015   cardiolyte neg, echo nl, cath showed mild/nonobstructive LAD disease  . Bilateral primary osteoarthritis of knee 06/30/2012   Right >L.  Right unicompartmental knee arthroplasty 10/25/18  . CAD (coronary artery disease)   . Chronic LBP    Multiple surgeries  . COPD (chronic obstructive pulmonary disease) ([place])   . COVID-19 virus infection 06/02/2019   Eval at [facility] ED and d/c'd home.  Admitted 06/12/19 with hypoxic RF and bilat infiltrates.  . DDD (degenerative disc disease)    spinal stenosis  . Fatty liver   . History of GI bleed    NSAIDS  . Hyperlipidemia    mixed  . Microscopic hematuria    full w/ u unrevealing X 2  . Normal nuclear stress test 11/11 and 06/2014   negative, EF normal  . Palpitations 2006   48H holter neg  . Prediabetes    Highest A1c 6.1% as of 03/2017  . RBBB (right bundle branch block)   . Recurrent UTI   . Seronegative rheumatoid arthritis ([place]) 10/2019   multiple sites->pred started, methotrex started 10/2019; rheum to add humira as of 03/2020  . Tobacco dependence in remission    Quit fall 2015.      M/A:  Current Outpatient Medications on File Prior to Visit  Medication Sig Dispense Refill  . acetaminophen (TYLENOL) 500 MG tablet every 6 (six) hours as needed for mild pain or headache.    . Adalimumab (HUMIRA [place]) Inject 40 mg into the skin. Every other week    . albuterol (VENTOLIN HFA) 108 (90 Base) MCG/ACT inhaler Inhale 2 puffs into the lungs every 4 (four) hours as needed for wheezing or shortness of breath (bronchitis).  Reported on 03/12/2016 18 g 1  . clopidogrel (PLAVIX) 75 MG tablet TAKE 1 TABLET(75 MG) BY MOUTH DAILY 90 tablet 3  . cyclobenzaprine (FLEXERIL) 10 MG tablet TAKE 1 TABLET BY MOUTH EVERY 8 HOURS AS NEEDED 30 tablet 0  . fenofibrate 160 MG tablet TAKE 1 TABLET BY MOUTH EVERY DAY WITH FOOD 90 tablet 3  . folic acid (FOLVITE) 1 MG tablet Take 1 mg by mouth daily.     Marland Kitchen lisinopril (ZESTRIL) 5 MG tablet Take 1 tablet (5 mg total) by mouth daily. 90 tablet 1  . LORazepam (ATIVAN) 1 MG tablet Take 1 tablet (1 mg total) by mouth 2 (two) times daily. 60 tablet 5  . methotrexate 2.5 MG tablet Take 2.5 mg by mouth once a week. Pt takes 8 pills.    Marland Kitchen oxyCODONE (OXY IR/ROXICODONE) 5 MG immediate release tablet 1-2 po tid prn pain 180 tablet 0  . pantoprazole (PROTONIX) 40 MG tablet TAKE 1 TABLET(40 MG) BY MOUTH DAILY 90 tablet 3  . rosuvastatin (CRESTOR) 40 MG tablet TAKE 1 TABLET(40 MG) BY MOUTH DAILY 90 tablet 3  . sodium chloride (OCEAN) 0.65 % SOLN nasal spray Place 1 spray into both nostrils as needed for congestion.    . nitroGLYCERIN (NITROSTAT) 0.4 MG SL tablet Place 1 tablet (0.4 mg total) under the tongue every 5 (five) minutes as needed for chest pain (MAX 3 doses.). (Patient not taking: Reported on 10/17/2020) 10 tablet 1  . predniSONE (DELTASONE) 5 MG tablet Take 5 mg by mouth daily.  (Patient not taking: Reported on 10/17/2020)     No current facility-administered medications on file prior to visit.   Allergies  Allergen Reactions  . Aspirin Other (See Comments)     GI Bleed  . Beta Adrenergic Blockers Other (See Comments)    REACTION: decreased libido  . Citalopram Nausea Only  . Nsaids Other (See Comments)     GI Upset  . Penicillins Swelling and Other (See Comments)    DID THE REACTION INVOLVE: Swelling of the face/tongue/throat, SOB, or low BP? Yes Sudden or severe rash/hives, skin peeling, or the inside of the mouth or nose? Yes Did it require medical treatment? Yes When did it last  happen? 64 years old If all above answers are "NO", may proceed with cephalosporin use.   Marland Kitchen Prochlorperazine Edisylate Other (See Comments)     Stroke like symptoms  . Sulfonamide Derivatives Other (See Comments)    Unknown allergic reaction  . Amitriptyline Hcl Palpitations and Other (See Comments)     increased heart rate    FH: Family History  Problem Relation Age of Onset  . Heart Problems Mother        and thyroid problems  . Hyperlipidemia Mother   . Diabetes Mother   . Breast cancer Mother   . Heart failure Father        CHF, heart attack, diabetes, hyperlipidemia  . Heart disease Brother        back problems  . Hypertension Brother   .  Kidney failure Maternal Grandmother   . Stroke Maternal Grandfather   . Cancer Paternal Grandmother        mouth - snuff  . Heart attack Paternal Grandfather        stroke, HTN  . Hyperlipidemia Brother   . Heart attack Brother   . Cancer Brother   . Coronary artery disease Neg Hx     SH: Social History   Socioeconomic History  . Marital status: Married    Spouse name: Not on file  . Number of children: Not on file  . Years of education: Not on file  . Highest education level: Not on file  Occupational History  . Not on file  Tobacco Use  . Smoking status: Former Smoker    Packs/day: 1.00    Years: 35.00    Pack years: 35.00    Types: Cigarettes    Quit date: 07/22/2014    Years since quitting: 6.2  . Smokeless tobacco: Never Used  . Tobacco comment: down to ~2 cigarettes/daily (06/23/14) - Quit Smoking around 07/20/14!  Vaping Use  . Vaping Use: Never used  Substance and Sexual Activity  . Alcohol use: No    Alcohol/week: 0.0 standard drinks  . Drug use: No  . Sexual activity: Yes    Partners: Male    Birth control/protection: Surgical  Other Topics Concern  . Not on file  Social History Narrative  . Not on file   Social Determinants of Health   Financial Resource Strain: Low Risk   . Difficulty of Paying  Living Expenses: Not hard at all  Food Insecurity: No Food Insecurity  . Worried About Charity fundraiser in the Last Year: Never true  . Ran Out of Food in the Last Year: Never true  Transportation Needs: No Transportation Needs  . Lack of Transportation (Medical): No  . Lack of Transportation (Non-Medical): No  Physical Activity: Inactive  . Days of Exercise per Week: 0 days  . Minutes of Exercise per Session: 0 min  Stress: No Stress Concern Present  . Feeling of Stress : Not at all  Social Connections: Moderately Integrated  . Frequency of Communication with Friends and Family: More than three times a week  . Frequency of Social Gatherings with Friends and Family: More than three times a week  . Attends Religious Services: 1 to 4 times per year  . Active Member of Clubs or Organizations: No  . Attends Archivist Meetings: Never  . Marital Status: Married    ROS: Review of Systems  Constitutional: Negative for chills, diaphoresis and fever.  Respiratory: Positive for cough, sputum production and shortness of breath.   Cardiovascular: Negative for chest pain and palpitations.  Gastrointestinal: Positive for abdominal pain and nausea. Negative for constipation, diarrhea, heartburn and vomiting.  Psychiatric/Behavioral: The patient is not nervous/anxious.     PE: Vitals with BMI 10/17/2020 08/10/2020 07/18/2020  Height 5\' 4"  5\' 4"  5\' 4"   Weight 216 lbs 3 oz 222 lbs 222 lbs 6 oz  BMI 37.09 14.48 18.56  Systolic 314 - 970  Diastolic 73 - 72  Pulse 92 - 82    Physical Exam Constitutional:      General: She is not in acute distress.    Appearance: Normal appearance.  HENT:     Head: Normocephalic and atraumatic.     Mouth/Throat:     Mouth: Mucous membranes are moist.     Pharynx: Oropharynx is clear.  Eyes:  Pupils: Pupils are equal, round, and reactive to light.  Cardiovascular:     Rate and Rhythm: Normal rate and regular rhythm.     Heart sounds: Normal  heart sounds. No murmur heard. No friction rub. No gallop.   Pulmonary:     Effort: Pulmonary effort is normal.     Breath sounds: Normal breath sounds.  Abdominal:     General: Bowel sounds are normal.     Palpations: Abdomen is soft.     Tenderness: There is abdominal tenderness (right upper and lower quadrants) in the right upper quadrant and right lower quadrant. Positive signs include Murphy's sign.  Neurological:     Mental Status: She is alert.  Psychiatric:        Mood and Affect: Mood normal.        Behavior: Behavior normal.     Labs: Recent Results (from the past 2160 hour(s))  CBC and differential     Status: None   Collection Time: 09/06/20 12:00 AM  Result Value Ref Range   Hemoglobin 14.6 12.0 - 16.0   HCT 44 36 - 46   Platelets 239 150 - 399   WBC 11.4   CBC     Status: None   Collection Time: 09/06/20 12:00 AM  Result Value Ref Range   RBC 4.52 3.87 - 6.75  Basic metabolic panel     Status: Abnormal   Collection Time: 09/06/20 12:00 AM  Result Value Ref Range   Glucose 106    BUN 14 4 - 21   CO2 30 (A) 13 - 22   Creatinine 0.6 0.5 - 1.1   Potassium 5.4 (A) 3.4 - 5.3   Sodium 144 137 - 147   Chloride 106 99 - 108  Comprehensive metabolic panel     Status: None   Collection Time: 09/06/20 12:00 AM  Result Value Ref Range   Globulin 3.2    GFR calc Af Amer 128.99    GFR calc non Af Amer 106.6    Calcium 9.4 8.7 - 10.7   Albumin 4.1 3.5 - 5.0  Hepatic function panel     Status: Abnormal   Collection Time: 09/06/20 12:00 AM  Result Value Ref Range   Alkaline Phosphatase 69 25 - 125   ALT 70 (A) 7 - 35   AST 78 (A) 13 - 35   Bilirubin, Total 0.5      A/P: In summary, Mirah BECKY COLAN is a 64 y.o. year old female who presents to the clinic today for 3 month f/u chronic pain syndrome, HTN, HLD, GAD, and prediabetes. On physical exam, patient having tenderness to palpation of RUQ and RLQ of abdomen. Positive murphy sign.  1) Acute bronchitis in the  setting of COPD: patient reporting increased cough and mucus production in the setting of COPD. Lung exam unremarkable. Symptoms alleviated with PRN albuterol inhaler. Patient with similar presentation in the past. Discussed with pt and decided against use of prednisone at this time, though. - Continue PRN Albuterol - Chest X ray  2) Acute persistent nausea: potentially subacute/chronic cholecystitis. Right upper and lower quadrant tenderness to palpation on abdominal exam. Positive murphy sign. Patient reporting aversion to food secondary to nausea. Reassuring no fever, chills, and patient hemodynamically stable. Patient without diarrhea or constipation. - RUQ [place] - CBC w/diff, CMET, and Lipase today  3) Chronic pain synd: DDD lumbar, bilat end stage knee DJD. Unchanged. Responsible use of oxycodone. CSC and UDS UTD. Patient reports pain  managed well. Continue current regimen. I did electronic rx's for oxycodone 5mg , 1-2 tid prn, #180 today for each of the next 3 months.  Appropriate fill on/after date was noted on each rx.   2) HTN:The current medical regimen is effective; continue present plan and medications.  3) HLD: tolerating statin and fibrate. FLP excellent 6 mo ago. Mild inc AST and ALT about 1 mo ago on rheum MD labs--has not had prob with inc LFTs in the past. FLP and hepatic panel today.  4) Anxiety, grief rxn: intol of citalopram (n/v). This is improving, patient denies increased anxiety. Patient reports anxiety well managed. Continue lorazepam 1mg  bid prn. No new rx for this needed today. CSC and UDS UTD.  5) Prediabetes: glucoses great. Hba1c <6 mo ago 6%. Continue diet. Hgb A1c today.  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 Patient reports significant improvement in arthritic pain. Was on low dose prednisone, currently on 2 week trial without prednisone. Followed by rheum.  Follow Up:  3 months for chronic illnesses Signed: Nanetta Batty, MS3  I personally was  present during the history, physical exam, and medical decision-making activities of this service and have verified that the service and findings are accurately documented in the student's note. Signed:  Crissie Sickles, MD           10/17/2020

## 2020-10-18 DIAGNOSIS — Z79899 Other long term (current) drug therapy: Secondary | ICD-10-CM | POA: Diagnosis not present

## 2020-10-18 LAB — COMPREHENSIVE METABOLIC PANEL
ALT: 49 U/L — ABNORMAL HIGH (ref 0–35)
AST: 77 U/L — ABNORMAL HIGH (ref 0–37)
Albumin: 4.4 g/dL (ref 3.5–5.2)
Alkaline Phosphatase: 62 U/L (ref 39–117)
BUN: 12 mg/dL (ref 6–23)
CO2: 26 mEq/L (ref 19–32)
Calcium: 10.3 mg/dL (ref 8.4–10.5)
Chloride: 102 mEq/L (ref 96–112)
Creatinine, Ser: 0.75 mg/dL (ref 0.40–1.20)
GFR: 84.17 mL/min (ref >60.00)
Glucose, Bld: 119 mg/dL — ABNORMAL HIGH (ref 70–99)
Potassium: 3.6 mEq/L (ref 3.5–5.1)
Sodium: 140 mEq/L (ref 135–145)
Total Bilirubin: 1 mg/dL (ref 0.2–1.2)
Total Protein: 6.9 g/dL (ref 6.0–8.3)

## 2020-10-18 LAB — LIPID PANEL
Cholesterol: 99 mg/dL (ref 0–200)
HDL: 33.1 mg/dL — ABNORMAL LOW (ref >39.00)
LDL Cholesterol: 33 mg/dL (ref 0–99)
NonHDL: 66.01
Total CHOL/HDL Ratio: 3
Triglycerides: 163 mg/dL — ABNORMAL HIGH (ref 0.0–149.0)
VLDL: 32.6 mg/dL (ref 0.0–40.0)

## 2020-10-18 LAB — CBC WITH DIFFERENTIAL/PLATELET
Basophils Absolute: 0 10*3/uL (ref 0.0–0.1)
Basophils Relative: 0.4 % (ref 0.0–3.0)
Eosinophils Absolute: 0.2 10*3/uL (ref 0.0–0.7)
Eosinophils Relative: 1.5 % (ref 0.0–5.0)
HCT: 43.5 % (ref 36.0–46.0)
Hemoglobin: 14.5 g/dL (ref 12.0–15.0)
Lymphocytes Relative: 28.5 % (ref 12.0–46.0)
Lymphs Abs: 3.3 10*3/uL (ref 0.7–4.0)
MCHC: 33.3 g/dL (ref 30.0–36.0)
MCV: 95.5 fl (ref 78.0–100.0)
Monocytes Absolute: 0.9 10*3/uL (ref 0.1–1.0)
Monocytes Relative: 8 % (ref 3.0–12.0)
Neutro Abs: 7.2 10*3/uL (ref 1.4–7.7)
Neutrophils Relative %: 61.6 % (ref 43.0–77.0)
Platelets: 230 10*3/uL (ref 150.0–400.0)
RBC: 4.56 Mil/uL (ref 3.87–5.11)
RDW: 13.8 % (ref 11.5–15.5)
WBC: 11.7 10*3/uL — ABNORMAL HIGH (ref 4.0–10.5)

## 2020-10-18 LAB — HEMOGLOBIN A1C: Hgb A1c MFr Bld: 6.1 % (ref 4.6–6.5)

## 2020-10-18 LAB — LIPASE: Lipase: 58 U/L (ref 11.0–59.0)

## 2020-11-15 ENCOUNTER — Encounter: Payer: Self-pay | Admitting: Internal Medicine

## 2020-11-21 ENCOUNTER — Other Ambulatory Visit: Payer: Self-pay | Admitting: Family Medicine

## 2020-11-21 NOTE — Telephone Encounter (Signed)
Requesting: Oxycodone Contract:12/08/19  UDS:12/28/19 Last Visit:10/17/20 Next Visit: 01/16/21 Last Refill:10/17/20(180,0)  Please Advise. Medication pending

## 2020-11-22 NOTE — Telephone Encounter (Signed)
Patient called back, states the pharmacy told her they do not have any RXs on file for oxycodone. She asked them to check for new prescriptions, not just refills. I Verified pharmacy is [facility] on [address], [place]. Please re-send new RXs.

## 2020-11-22 NOTE — Telephone Encounter (Addendum)
Please advise, spoke with pharmacy and pt got Rx filled on 10/19/20 (180,0). But there are no other Rx's on file with pharmacy.

## 2020-11-22 NOTE — Telephone Encounter (Signed)
Pt should have rx's for this month and for February already on file with  Pharmacy-thx

## 2020-11-23 NOTE — Telephone Encounter (Signed)
Initial request denied. Request sent back to PCP 11/22/20.   Patient Name: AQUA DENSLOW Gender: Female DOB: 04/04/1956 Age: 65 Y 7 M 15 D Return Phone Number: [phone] (Primary) Address[place] [address] Client [facility] - Client Client Site [place] - Day Physician Crissie Sickles - MD Contact Type Call Who Is Calling Patient / Member / Family / Caregiver Call Type Triage / Clinical Relationship To Patient Self Return Phone Number [phone] (Primary) Chief Complaint Back Pain - General Reason for Call Medication Question / Request Initial Comment Caller is out of her pain Rx for back pain. She called pharmacy and they are still waiting for Dr. Idelle Leech office to fax refill request. Was seen 10/17/20. Asks for RN callback as back pain is increasing. Mentions RA. Translation No Nurse Assessment Nurse: Harvie Bridge, RN, Beth Date/Time (Eastern Time): 11/23/2020 9:50:11 AM Confirm and document reason for call. If symptomatic, describe symptoms. ---Caller is out of her pain Rx - oxycodone 5 mg (controlled substance) for back pain. She called pharmacy and they are still waiting for Dr. Idelle Leech office to fax refill request. Was seen 10/17/20. Asks for RN callback as back pain is increasing. Mentions RA. Does the patient have any new or worsening symptoms? ---Yes Will a triage be completed? ---Yes Related visit to physician within the last 2 weeks? ---Yes Does the PT have any chronic conditions? (i.e. diabetes, asthma, this includes High risk factors for pregnancy, etc.) ---No Is this a behavioral health or substance abuse call? ---No Guidelines Guideline Title Affirmed Question Affirmed Notes Nurse Date/Time (Eastern Time) Back Pain [1] MODERATE back pain (e.g., interferes with normal activities) AND [2] present > 3 days Newhart, RN, [facility] 11/23/2020 9:52:12 AM Disp. Time Eilene Ghazi Time) Disposition Final  User 11/23/2020 9:54:35 AM SEE PCP WITHIN 3 DAYS Yes Newhart, RN, Beth PLEASE NOTE: All timestamps contained within this report are represented as [place] Standard Time. CONFIDENTIALTY NOTICE: This fax transmission is intended only for the addressee. It contains information that is legally privileged, confidential or otherwise protected from use or disclosure. If you are not the intended recipient, you are strictly prohibited from reviewing, disclosing, copying using or disseminating any of this information or taking any action in reliance on or regarding this information. If you have received this fax in error, please notify us immediately by telephone so that we can arrange for its return to [place]. Phone: [phone], Toll-Free: [phone], Fax: [phone] Page: 2 of 2 Call Id: [id number] [place] Disagree/Comply Comply Caller Understands Yes PreDisposition Did not know what to do Care Advice Given Per Guideline SEE PCP WITHIN 3 DAYS: * You need to be seen within 2 or 3 days. USE HEAT: * Use a heat pack, heating pad, or warm wet washcloth. * Do this for 10 minutes three times a day. * This will help increase blood flow and decrease pain. SLEEP: * Sleep on your side with a pillow between your knees. PAIN MEDICINES: * ACETAMINOPHEN - REGULAR STRENGTH TYLENOL: Take 650 mg (two 325 mg pills) by mouth every 4 to 6 hours as needed. Each Regular Strength Tylenol pill has 325 mg of acetaminophen. The most you should take each day is 3,250 mg (10 pills a day). * IBUPROFEN (E.G., MOTRIN, ADVIL): Take 400 mg (two 200 mg pills) by mouth every 6 hours. The most you should take each day is 1,200 mg (six 200 mg pills), unless your doctor has told you to take more. CALL BACK  IF: * Numbness or weakness occurs, or bowel/bladder problems * There are any urine symptoms or fever * You become worse CARE ADVICE given per Back Pain (Adult) guideline.

## 2020-11-24 NOTE — Telephone Encounter (Signed)
Please advise   You routed conversation to McGowen, Philip H, MD 2 days ago    You 2 days ago    VW     Please advise, spoke with pharmacy and pt got Rx filled on 10/19/20 (180,0). But there are no other Rx's on file with pharmacy.     

## 2020-11-25 ENCOUNTER — Other Ambulatory Visit: Payer: Self-pay | Admitting: Family Medicine

## 2020-11-25 MED ORDER — OXYCODONE HCL 5 MG PO TABS: ORAL_TABLET | ORAL | 0 refills | Status: DC

## 2020-11-25 NOTE — Telephone Encounter (Signed)
Please advise   You routed conversation to Tammi Sou, MD 2 days ago    You 2 days ago    VW     Please advise, spoke with pharmacy and pt got Rx filled on 10/19/20 (180,0). But there are no other Rx's on file with pharmacy.

## 2020-11-25 NOTE — Telephone Encounter (Signed)
Pt.notified

## 2020-11-28 ENCOUNTER — Other Ambulatory Visit: Payer: Self-pay | Admitting: Family Medicine

## 2020-11-28 ENCOUNTER — Telehealth: Payer: Self-pay | Admitting: Family Medicine

## 2020-11-28 DIAGNOSIS — R059 Cough, unspecified: Secondary | ICD-10-CM

## 2020-11-28 DIAGNOSIS — J449 Chronic obstructive pulmonary disease, unspecified: Secondary | ICD-10-CM

## 2020-11-28 NOTE — Telephone Encounter (Signed)
Please advise, I do see an order for [place] but not xray. Pt is scheduled for 12/06/19 for [place].

## 2020-11-28 NOTE — Telephone Encounter (Signed)
Pt aware of placed order

## 2020-11-28 NOTE — Telephone Encounter (Signed)
Patient states Dr. Anitra Lauth wanted her to have a chest xray but [facility] states they do not have an order.

## 2020-11-28 NOTE — Telephone Encounter (Signed)
Ok, chest x-ray order is in now.

## 2020-12-05 ENCOUNTER — Other Ambulatory Visit: Payer: Self-pay

## 2020-12-05 ENCOUNTER — Ambulatory Visit (HOSPITAL_COMMUNITY)
Admission: RE | Admit: 2020-12-05 | Discharge: 2020-12-05 | Disposition: A | Discharge status: Home / Self Care | Payer: Medicare Other | Source: Ambulatory Visit | Attending: Family Medicine | Admitting: Family Medicine

## 2020-12-05 ENCOUNTER — Other Ambulatory Visit: Payer: Self-pay | Admitting: Family Medicine

## 2020-12-05 DIAGNOSIS — R059 Cough, unspecified: Secondary | ICD-10-CM | POA: Insufficient documentation

## 2020-12-05 DIAGNOSIS — R110 Nausea: Secondary | ICD-10-CM | POA: Insufficient documentation

## 2020-12-05 DIAGNOSIS — J449 Chronic obstructive pulmonary disease, unspecified: Secondary | ICD-10-CM | POA: Insufficient documentation

## 2020-12-05 DIAGNOSIS — R1011 Right upper quadrant pain: Secondary | ICD-10-CM | POA: Diagnosis not present

## 2020-12-05 DIAGNOSIS — J189 Pneumonia, unspecified organism: Secondary | ICD-10-CM | POA: Diagnosis not present

## 2020-12-05 DIAGNOSIS — R600 Localized edema: Secondary | ICD-10-CM | POA: Diagnosis not present

## 2020-12-07 DIAGNOSIS — Z79899 Other long term (current) drug therapy: Secondary | ICD-10-CM | POA: Diagnosis not present

## 2020-12-07 DIAGNOSIS — M0609 Rheumatoid arthritis without rheumatoid factor, multiple sites: Secondary | ICD-10-CM | POA: Diagnosis not present

## 2020-12-07 DIAGNOSIS — M1990 Unspecified osteoarthritis, unspecified site: Secondary | ICD-10-CM | POA: Diagnosis not present

## 2020-12-07 DIAGNOSIS — M79643 Pain in unspecified hand: Secondary | ICD-10-CM | POA: Diagnosis not present

## 2020-12-07 LAB — POCT ERYTHROCYTE SEDIMENTATION RATE, NON-AUTOMATED: Sed Rate: 7

## 2020-12-07 LAB — BASIC METABOLIC PANEL
BUN: 11 (ref 4–21)
CO2: 21 (ref 13–22)
Chloride: 106 (ref 99–108)
Creatinine: 0.7 (ref 0.5–1.1)
Glucose: 121
Potassium: 4.2 (ref 3.4–5.3)
Sodium: 145 (ref 137–147)

## 2020-12-07 LAB — HEPATIC FUNCTION PANEL
ALT: 48 — AB (ref 7–35)
AST: 107 — AB (ref 13–35)
Alkaline Phosphatase: 68 (ref 25–125)
Bilirubin, Total: 0.4

## 2020-12-07 LAB — COMPREHENSIVE METABOLIC PANEL
Albumin: 4.5 (ref 3.5–5.0)
Calcium: 10.1 (ref 8.7–10.7)
GFR calc Af Amer: 102
Globulin: 2.24

## 2020-12-07 LAB — CBC AND DIFFERENTIAL
HCT: 44 (ref 36–46)
HCT: 44 (ref 36–46)
Hemoglobin: 14.4 (ref 12.0–16.0)
Hemoglobin: 14.4 (ref 12.0–16.0)
Platelets: 189 (ref 150–399)
WBC: 9.7
WBC: 9.7

## 2020-12-07 LAB — CBC
RBC: 4.54 (ref 3.87–5.11)
RBC: 4.54 (ref 3.87–5.11)

## 2020-12-08 ENCOUNTER — Encounter: Payer: Self-pay | Admitting: Internal Medicine

## 2020-12-08 LAB — COMPREHENSIVE METABOLIC PANEL
Albumin: 4.5 (ref 3.5–5.0)
Calcium: 10.1 (ref 8.7–10.7)
GFR calc Af Amer: 102
GFR calc non Af Amer: 89
Globulin: 2.4

## 2020-12-08 LAB — CBC AND DIFFERENTIAL
HCT: 44 (ref 36–46)
Hemoglobin: 14.4 (ref 12.0–16.0)
Platelets: 189 (ref 150–399)
WBC: 9.7

## 2020-12-08 LAB — HEPATIC FUNCTION PANEL
ALT: 48 — AB (ref 7–35)
AST: 107 — AB (ref 13–35)
Alkaline Phosphatase: 68 (ref 25–125)
Bilirubin, Total: 0.4

## 2020-12-08 LAB — BASIC METABOLIC PANEL
BUN: 8 (ref 4–21)
CO2: 21 (ref 13–22)
Chloride: 106 (ref 99–108)
Creatinine: 0.7 (ref 0.5–1.1)
Glucose: 121
Potassium: 4.2 (ref 3.4–5.3)
Sodium: 145 (ref 137–147)

## 2020-12-08 LAB — CBC: RBC: 4.54 (ref 3.87–5.11)

## 2020-12-14 ENCOUNTER — Encounter: Payer: Self-pay | Admitting: Family Medicine

## 2020-12-14 ENCOUNTER — Telehealth: Payer: Self-pay | Admitting: Family Medicine

## 2020-12-14 NOTE — Telephone Encounter (Signed)
Pls fax copy of pt's US ABDOMEN LIMITED RUQ from 12/06/19 to [place] (her rheumatologist, Dr. Lenetta Quaker).-thx

## 2020-12-14 NOTE — Telephone Encounter (Signed)
Imaging faxed to [place] at [facility]. Fax confirmation received.

## 2020-12-21 ENCOUNTER — Telehealth: Payer: Self-pay | Admitting: Family Medicine

## 2020-12-21 NOTE — Telephone Encounter (Signed)
Hassan Rowan with [employer] called with this prior authorization approval: Cyclobenzaprine 10mg  Auth# [id number] Effective 12/19/20 - 12/19/21

## 2020-12-21 NOTE — Telephone Encounter (Signed)
noted 

## 2020-12-26 ENCOUNTER — Other Ambulatory Visit: Payer: Self-pay

## 2020-12-26 MED ORDER — OXYCODONE HCL 5 MG PO TABS: ORAL_TABLET | ORAL | 0 refills | Status: DC

## 2020-12-26 NOTE — Telephone Encounter (Signed)
Requesting:oxyCODONE (OXY IR/ROXICODONE) 5 MG immediate release tablet [[id number]]  Contract:01/04/20 UDS:12/28/19 Last Visit:10/17/20 Next Visit:01/16/21 Last Refill: 11/25/20 (180,0)  Please Advise

## 2020-12-26 NOTE — Telephone Encounter (Signed)
Patient refill request  oxyCODONE (OXY IR/ROXICODONE) 5 MG immediate release tablet [[id number]]   [facility] [phone] - [place], [place]

## 2021-01-05 ENCOUNTER — Telehealth: Payer: Self-pay

## 2021-01-05 ENCOUNTER — Ambulatory Visit: Payer: Medicare Other | Admitting: Internal Medicine

## 2021-01-05 ENCOUNTER — Encounter: Payer: Self-pay | Admitting: Internal Medicine

## 2021-01-05 ENCOUNTER — Other Ambulatory Visit: Payer: Self-pay

## 2021-01-05 VITALS — BP 128/75 | HR 92 | Temp 96.9°F | Ht 62.0 in | Wt 201.8 lb

## 2021-01-05 DIAGNOSIS — R945 Abnormal results of liver function studies: Secondary | ICD-10-CM | POA: Diagnosis not present

## 2021-01-05 DIAGNOSIS — R1011 Right upper quadrant pain: Secondary | ICD-10-CM | POA: Diagnosis not present

## 2021-01-05 DIAGNOSIS — R197 Diarrhea, unspecified: Secondary | ICD-10-CM

## 2021-01-05 DIAGNOSIS — R112 Nausea with vomiting, unspecified: Secondary | ICD-10-CM | POA: Diagnosis not present

## 2021-01-05 DIAGNOSIS — R7989 Other specified abnormal findings of blood chemistry: Secondary | ICD-10-CM

## 2021-01-05 IMAGING — DX DG HAND COMPLETE 3+V*R*
3 series · 3 of 3 positions shown · non-contrast
Comparison: No recent prior.

CLINICAL DATA: Hand stiffness.  Pain.  No known injury.

EXAM:
RIGHT HAND - COMPLETE 3+ VIEW

[hand pa]
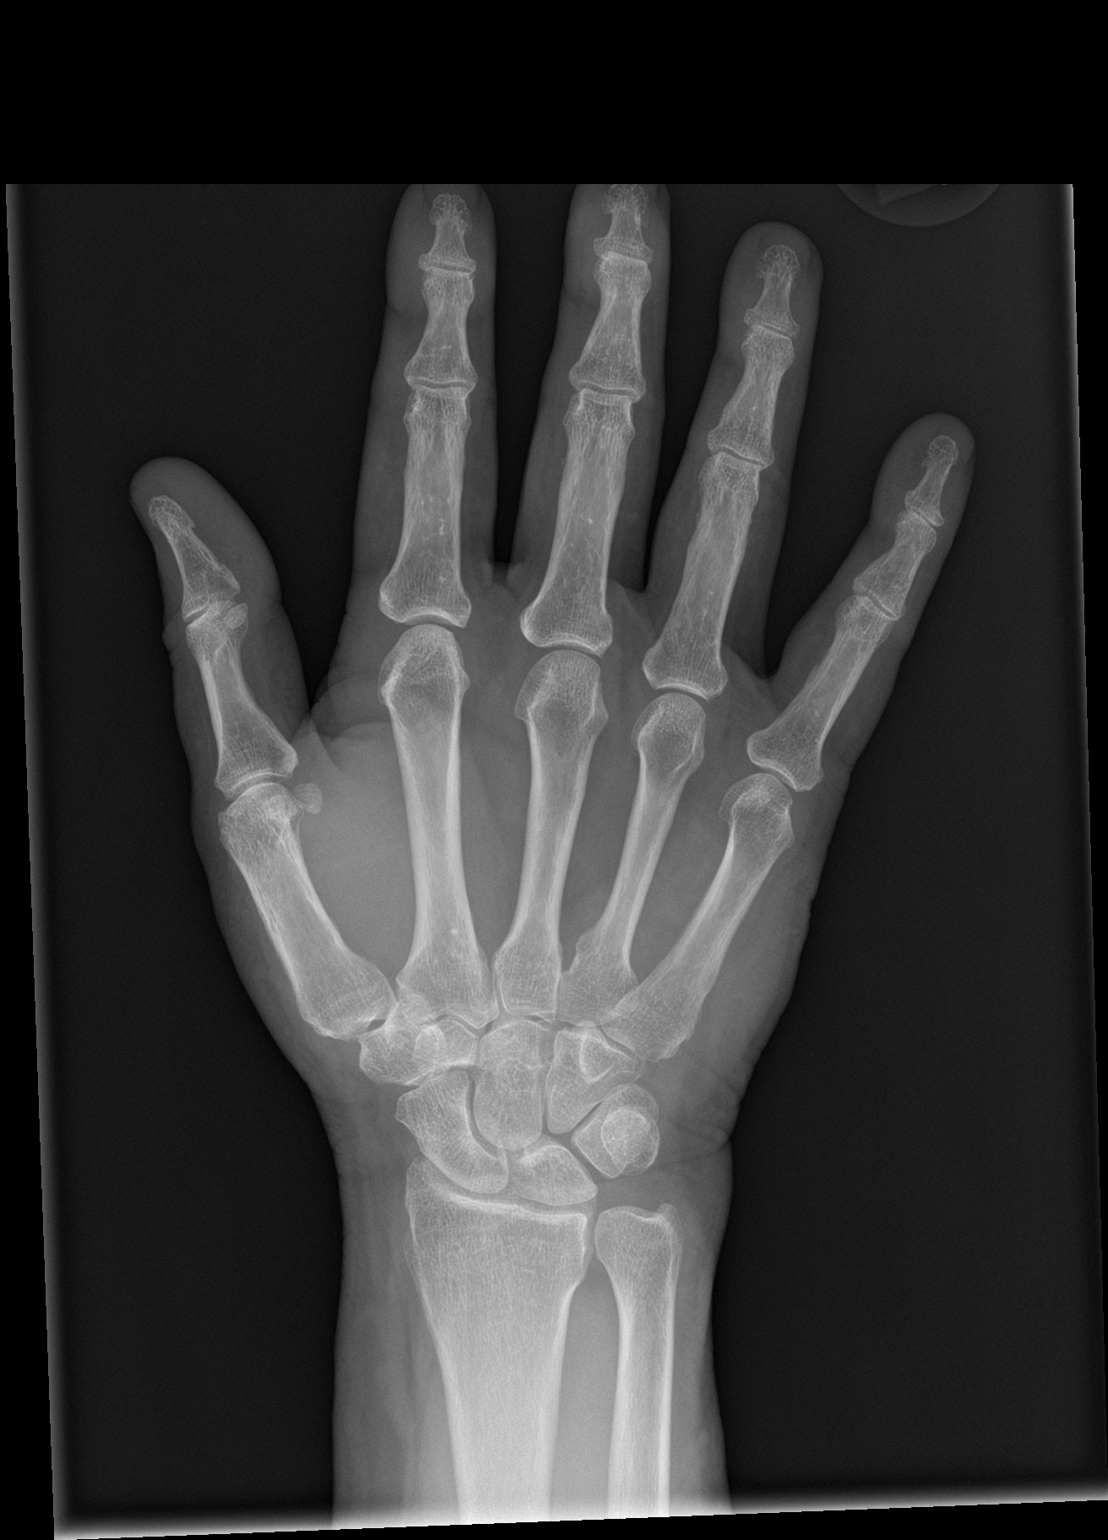

[hand obl]
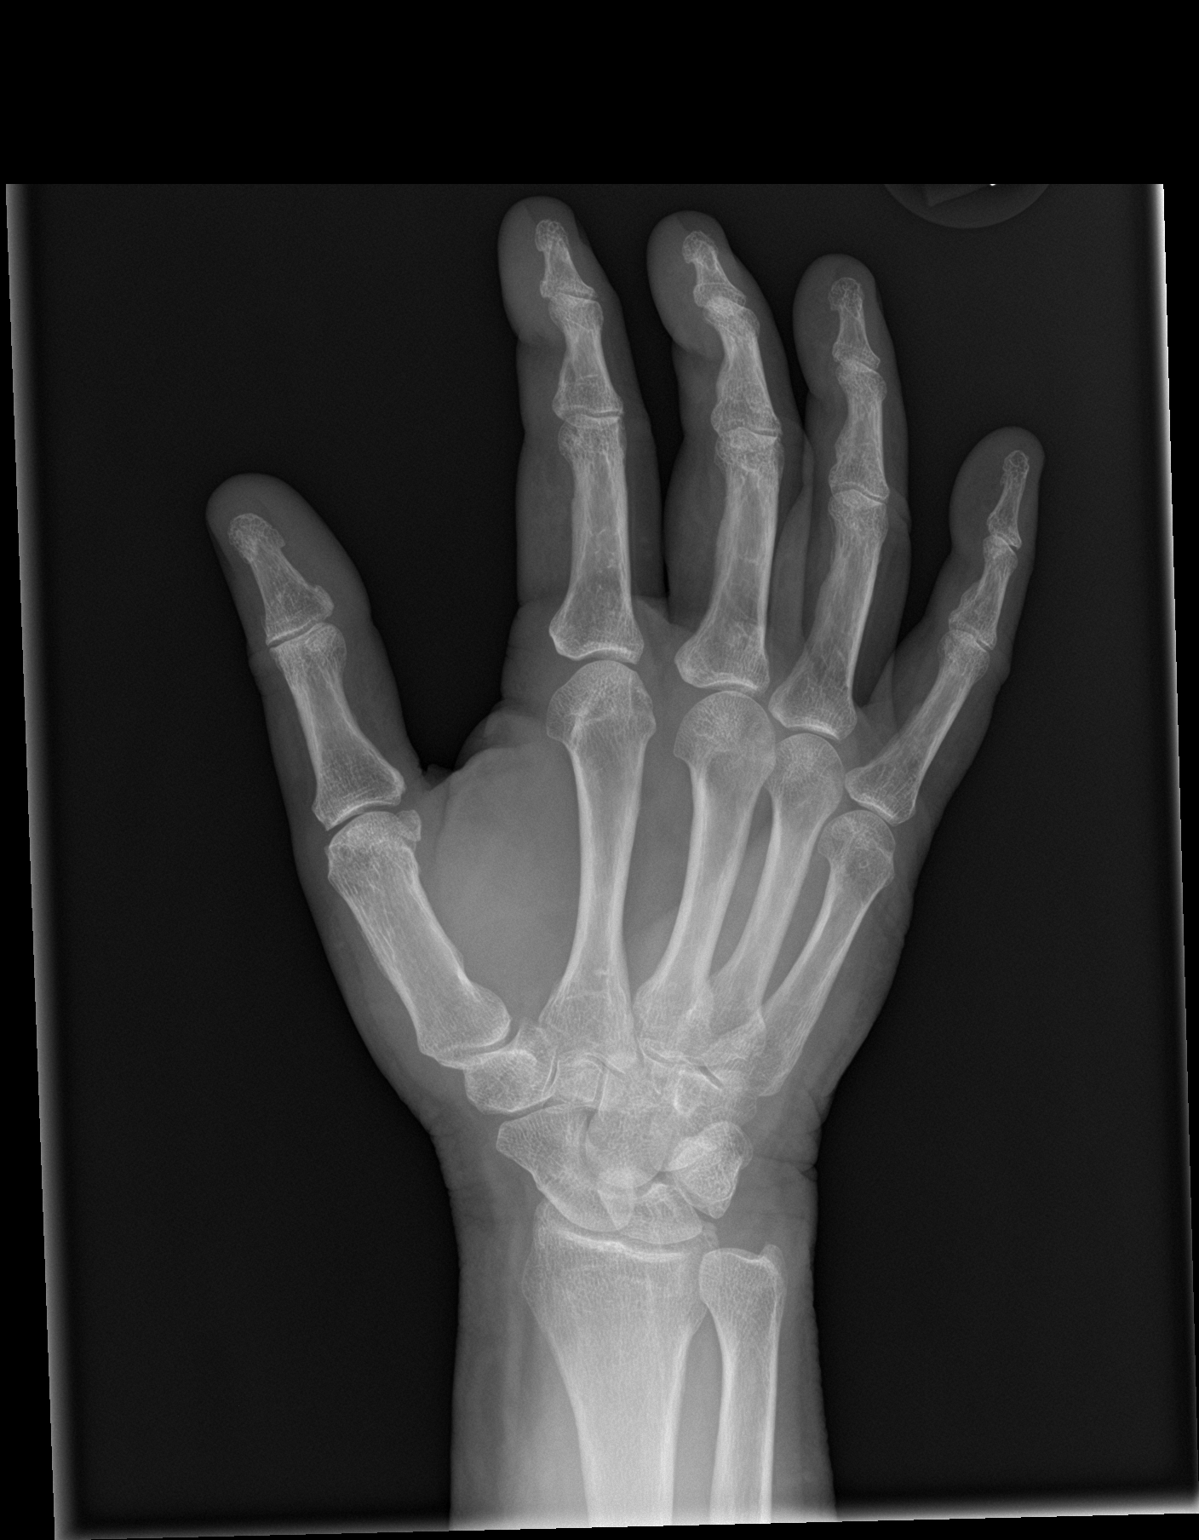

[hand lat]
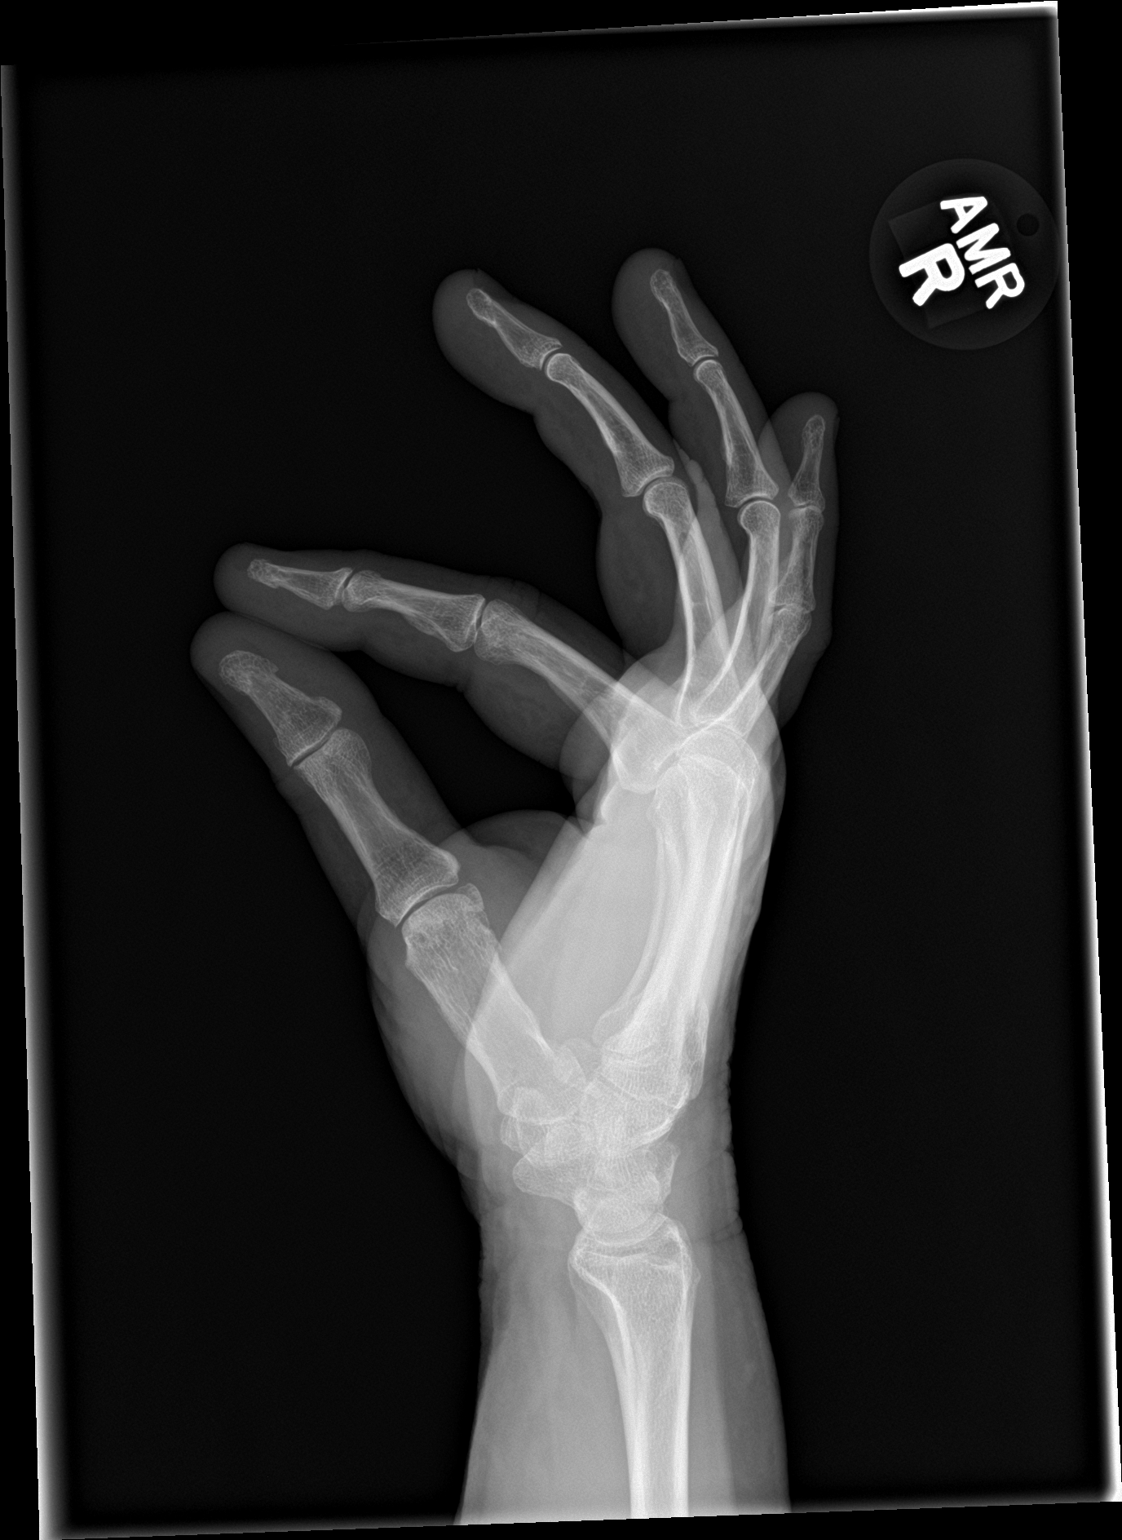

[3 of 3 positions shown; findings below may reference images not displayed]

FINDINGS: No acute bony or joint abnormality. No evidence of fracture or
dislocation. Mild diffuse degenerative change. No evidence of
erosive arthropathy.
IMPRESSION: No acute abnormality identified. Mild diffuse degenerative change.
No evidence of erosive arthropathy.

## 2021-01-05 NOTE — Progress Notes (Signed)
Primary Care Physician:  Tammi Sou, MD Primary Gastroenterologist:  Dr. Abbey Chatters  Chief Complaint  Patient presents with  . Nausea    W/ vomiting, last week had threw up what look liked dry blood  . Abdominal Pain    RUQ/RLQ, pain comes/goes every 1-2 weeks    HPI:   Brenda [name] is a 65 y.o. female who presents to the clinic today as a new patient by referral from her PCP Dr. Anitra Lauth.  She has multiple complaints for me today.  She notes chronic right upper quadrant pain has been going on for year though has recently worsened significantly over the last 2 months.  States it is intermittent, sometimes radiates to her back.  Also has associated nausea with occasional vomiting.  Notes that she vomited what appeared to be dark blood at one point.  She had an ultrasound which showed fatty liver, otherwise unremarkable.  Also has chronic diarrhea as well.  This is intermittent as well however.  Denies any melena hematochezia.  No previous colonoscopy but does note she gets stool cards every year for colon cancer screening and they have always been negative.  Also reports intermittent NSAID use..  She has underlying rheumatoid arthritis is chronically on methotrexate.  She has had recently increasing liver function test.  Most recently AST 107, ALT 48, alk phos and T bili WNL.  Her methotrexate has been placed on hold.  Past Medical History:  Diagnosis Date  . Aortic valve stenosis    "very mild"  . Atypical chest pain 2007; 2015   cardiolyte neg, echo nl, cath showed mild/nonobstructive LAD disease  . Bilateral primary osteoarthritis of knee 06/30/2012   Right >L.  Right unicompartmental knee arthroplasty 10/25/18  . CAD (coronary artery disease)   . Chronic LBP    Multiple surgeries  . COPD (chronic obstructive pulmonary disease) ([place])   . COVID-19 virus infection 06/02/2019   Eval at [facility] ED and d/c'd home.  Admitted 06/12/19 with hypoxic RF and bilat infiltrates.  . DDD  (degenerative disc disease)    spinal stenosis  . Elevated liver enzymes    2022->rheum d/c'd her methotrexate in response to rising (mild) AST and ALT elev on their labs 12/07/20.  Her abd u/s 12/05/20 showed fatty liver, o/w normal.  . Fatty liver   . History of GI bleed    NSAIDS  . Hyperlipidemia    mixed  . Microscopic hematuria    full w/ u unrevealing X 2  . Normal nuclear stress test 11/11 and 06/2014   negative, EF normal  . Palpitations 2006   48H holter neg  . Prediabetes    Highest A1c 6.1% as of 03/2017  . RBBB (right bundle branch block)   . Recurrent UTI   . Seronegative rheumatoid arthritis [place] 10/2019   multiple sites->pred started, methotrex started 10/2019; rheum to add humira as of 03/2020  . Tobacco dependence in remission    Quit fall 2015.      Past Surgical History:  Procedure Laterality Date  . ABDOMINAL HYSTERECTOMY  1997   DUB  . APPENDECTOMY  1984  . CARDIAC CATHETERIZATION  10/09/2005   no CAD, mildly elevated LVEDP, normal LV function (Dr. Gerrie Nordmann)  . CARDIAC CATHETERIZATION N/A 05/16/2015   Mild non-obstructive CAD--med mgmt.  Procedure: Left Heart Cath and Coronary Angiography;  Surgeon: Pixie Casino, MD;  Location: [facility];  Service: Cardiovascular;  Laterality: N/A;  . CARDIOVASCULAR STRESS  TEST  06/2014   normal lexiscan NST  . CARDIOVASCULAR STRESS TEST  2006   persantine - no ischemia, low risk   . KNEE ARTHROSCOPY Bilateral   . left wrist ganglion cyst excision  2010  . [place]   right iliac crest bone graft+metal instrumentation; 2005 metal removal and decompression, 2011 lumbar decompression 4-11, then stabilization/ instrumentation done 09-19-10: L2,L3, L5 left and L2 , L3, L4 right pedical remnant L4 left embedded. Left iliac crest bone graft-- Dr Velna Ochs  . LUMBAR SPINE SURGERY  02/10/2016   Dr. Velna Ochs: lumbar decompression, instrumentation removal, and fusion exploration--HELPED her a lot, esp her  radicular leg pains.  . OOPHORECTOMY Right    cyst  . PARTIAL KNEE ARTHROPLASTY Right 11/04/2018   Procedure: UNICOMPARTMENTAL KNEE;  Surgeon: Renette Butters, MD;  Location: [facility];  Service: Orthopedics;  Laterality: Right;  Adductor Block  . TONSILLECTOMY  65 yrs old  . TONSILLECTOMY    . TRANSTHORACIC ECHOCARDIOGRAM  2006   EF=>55%, trace MR, mild TR, trace AV regurg, trace pulm valve regurg     Current Outpatient Medications  Medication Sig Dispense Refill  . acetaminophen (TYLENOL) 500 MG tablet every 6 (six) hours as needed for mild pain or headache.    . Adalimumab (HUMIRA [place]) Inject 40 mg into the skin. Every other week    . albuterol (VENTOLIN HFA) 108 (90 Base) MCG/ACT inhaler Inhale 2 puffs into the lungs every 4 (four) hours as needed for wheezing or shortness of breath (bronchitis). Reported on 03/12/2016 18 g 1  . clopidogrel (PLAVIX) 75 MG tablet TAKE 1 TABLET(75 MG) BY MOUTH DAILY 90 tablet 3  . cyclobenzaprine (FLEXERIL) 10 MG tablet TAKE 1 TABLET BY MOUTH EVERY 8 HOURS AS NEEDED 90 tablet 3  . fenofibrate 160 MG tablet TAKE 1 TABLET BY MOUTH EVERY DAY WITH FOOD 90 tablet 3  . folic acid (FOLVITE) 1 MG tablet Take 1 mg by mouth daily.     Marland Kitchen lisinopril (ZESTRIL) 5 MG tablet TAKE 1 TABLET(5 MG) BY MOUTH DAILY 90 tablet 1  . LORazepam (ATIVAN) 1 MG tablet Take 1 tablet (1 mg total) by mouth 2 (two) times daily. 60 tablet 5  . nitroGLYCERIN (NITROSTAT) 0.4 MG SL tablet Place 1 tablet (0.4 mg total) under the tongue every 5 (five) minutes as needed for chest pain (MAX 3 doses.). 10 tablet 1  . oxyCODONE (OXY IR/ROXICODONE) 5 MG immediate release tablet 1-2 po tid prn pain 180 tablet 0  . pantoprazole (PROTONIX) 40 MG tablet TAKE 1 TABLET(40 MG) BY MOUTH DAILY 90 tablet 3  . rosuvastatin (CRESTOR) 40 MG tablet TAKE 1 TABLET(40 MG) BY MOUTH DAILY 90 tablet 3  . sodium chloride (OCEAN) 0.65 % SOLN nasal spray Place 1 spray into both nostrils as needed for congestion.    .  methotrexate 2.5 MG tablet Take 2.5 mg by mouth once a week. Pt takes 8 pills. (Patient not taking: Reported on 01/05/2021)     No current facility-administered medications for this visit.    Allergies as of 01/05/2021 - Review Complete 01/05/2021  Allergen Reaction Noted  . Aspirin Other (See Comments)   . Beta adrenergic blockers Other (See Comments)   . Citalopram Nausea Only 04/19/2020  . Nsaids Other (See Comments)   . Penicillins Swelling and Other (See Comments)   . Prochlorperazine edisylate Other (See Comments)   . Sulfonamide derivatives Other (See Comments)   . Amitriptyline hcl Palpitations and Other (See  Comments)     Family History  Problem Relation Age of Onset  . Heart Problems Mother        and thyroid problems  . Hyperlipidemia Mother   . Diabetes Mother   . Breast cancer Mother   . Heart failure Father        CHF, heart attack, diabetes, hyperlipidemia  . Heart disease Brother        back problems  . Hypertension Brother   . Kidney failure Maternal Grandmother   . Stroke Maternal Grandfather   . Cancer Paternal Grandmother        mouth - snuff  . Heart attack Paternal Grandfather        stroke, HTN  . Hyperlipidemia Brother   . Heart attack Brother   . Cancer Brother   . Coronary artery disease Neg Hx     Social History   Socioeconomic History  . Marital status: Married    Spouse name: Not on file  . Number of children: Not on file  . Years of education: Not on file  . Highest education level: Not on file  Occupational History  . Not on file  Tobacco Use  . Smoking status: Former Smoker    Packs/day: 1.00    Years: 35.00    Pack years: 35.00    Types: Cigarettes    Quit date: 07/22/2014    Years since quitting: 6.4  . Smokeless tobacco: Never Used  . Tobacco comment: down to ~2 cigarettes/daily (06/23/14) - Quit Smoking around 07/20/14!  Vaping Use  . Vaping Use: Never used  Substance and Sexual Activity  . Alcohol use: No     Alcohol/week: 0.0 standard drinks  . Drug use: No  . Sexual activity: Yes    Partners: Male    Birth control/protection: Surgical  Other Topics Concern  . Not on file  Social History Narrative  . Not on file   Social Determinants of Health   Financial Resource Strain: Low Risk   . Difficulty of Paying Living Expenses: Not hard at all  Food Insecurity: No Food Insecurity  . Worried About Charity fundraiser in the Last Year: Never true  . Ran Out of Food in the Last Year: Never true  Transportation Needs: No Transportation Needs  . Lack of Transportation (Medical): No  . Lack of Transportation (Non-Medical): No  Physical Activity: Inactive  . Days of Exercise per Week: 0 days  . Minutes of Exercise per Session: 0 min  Stress: No Stress Concern Present  . Feeling of Stress : Not at all  Social Connections: Moderately Integrated  . Frequency of Communication with Friends and Family: More than three times a week  . Frequency of Social Gatherings with Friends and Family: More than three times a week  . Attends Religious Services: 1 to 4 times per year  . Active Member of Clubs or Organizations: No  . Attends Archivist Meetings: Never  . Marital Status: Married  Human resources officer Violence: Not At Risk  . Fear of Current or Ex-Partner: No  . Emotionally Abused: No  . Physically Abused: No  . Sexually Abused: No    Subjective: Review of Systems  Gastrointestinal: Positive for abdominal pain, diarrhea and nausea.       Objective: BP 128/75   Pulse 92   Temp (!) 96.9 F (36.1 C)   Ht 5' 2"  (1.575 m)   Wt 201 lb 12.8 oz (91.5 kg)   BMI 36.91  kg/m  Physical Exam Constitutional:      Appearance: Normal appearance.  HENT:     Head: Normocephalic and atraumatic.  Eyes:     Extraocular Movements: Extraocular movements intact.     Conjunctiva/sclera: Conjunctivae normal.  Cardiovascular:     Rate and Rhythm: Normal rate and regular rhythm.  Pulmonary:      Effort: Pulmonary effort is normal.     Breath sounds: Normal breath sounds.  Abdominal:     General: Bowel sounds are normal.     Palpations: Abdomen is soft.  Musculoskeletal:        General: No swelling. Normal range of motion.     Cervical back: Normal range of motion and neck supple.  Skin:    General: Skin is warm and dry.     Coloration: Skin is not jaundiced.  Neurological:     General: No focal deficit present.     Mental Status: She is alert and oriented to person, place, and time.  Psychiatric:        Mood and Affect: Mood normal.        Behavior: Behavior normal.      Assessment: *Nausea and vomiting *Right upper quadrant pain *Diarrhea *Abnormal liver function test  Plan: Etiology of patient's nausea and right upper quadrant pain unclear.  Right upper quadrant ultrasound showed fatty liver, no evidence of cholecystitis or cholelithiasis.  Will schedule for EGD to evaluate for peptic ulcer disease, esophagitis, gastritis, H. Pylori, duodenitis, or other. Will also evaluate for esophageal stricture, Schatzki's ring, esophageal web or other.   The risks including infection, bleed, or perforation as well as benefits, limitations, alternatives and imponderables have been reviewed with the patient. Potential for esophageal dilation, biopsy, etc. have also been reviewed.  Questions have been answered. All parties agreeable.  Patient mention during her visit what sounds like a history of AAA though I cannot find this anywhere in her chart.  We will order AAA ultrasound to evaluate this prior to her procedure  In regards to her abnormal LFTs, possible this is due to methotrexate use which is currently on hold.  However, given the continued rise, I will perform full serological work-up today.  Patient may need further testing to include FibroScan or liver biopsy depending on results.  Continue to hold methotrexate.  Further recommendations to follow.  Thank you Dr. Anitra Lauth  for the kind referral.  01/05/2021 4:02 PM   Disclaimer: This note was dictated with voice recognition software. Similar sounding words can inadvertently be transcribed and may not be corrected upon review.

## 2021-01-05 NOTE — Telephone Encounter (Signed)
Yes please have patient hold Plavix x5 days. She also mentioned she has what sounds like a AAA? But I can't find this anywhere in her chart. Can we order AAA [place] to evaluate this prior to her procedure? Thank you

## 2021-01-05 NOTE — Patient Instructions (Signed)
Your regards to your abdominal pain, we will schedule you for upper endoscopy to further evaluate.  Continue on daily pantoprazole.  In regards to your abnormal liver test, I am going to perform a full work-up to look for underlying causes which is going to include a lot of blood work to start out with.  Some of these tests do take a week or so to come back so it will not be until next week until I contact you with these results.  At [facility] we value your feedback. You may receive a survey about your visit today. Please share your experience as we strive to create trusting relationships with our patients to provide genuine, compassionate, quality care.  We appreciate your understanding and patience as we review any laboratory studies, imaging, and other diagnostic tests that are ordered as we care for you. Our office policy is 5 business days for review of these results, and any emergent or urgent results are addressed in a timely manner for your best interest. If you do not hear from our office in 1 week, please contact us.   We also encourage the use of MyChart, which contains your medical information for your review as well. If you are not enrolled in this feature, an access code is on this after visit summary for your convenience. Thank you for allowing [place] to be involved in your care.  It was great to see you today!  I hope you have a great rest of your spring!!    Elon Alas. Abbey Chatters, D.O. Gastroenterology and Hepatology [facility]

## 2021-01-05 NOTE — Telephone Encounter (Signed)
Dr. Abbey Chatters, does she need to hold Plavix for 5 days before EGD?

## 2021-01-09 NOTE — Telephone Encounter (Signed)
Spoke to Rich at [facility] ultrasound, he advised to order US aorta.  US aorta scheduled for 01/16/21 at 9:30am, arrive at 9:15am. NPO after midnight.  Called and informed pt of US aorta appt. Letter mailed.   Holding Plavix noted on EGD instructions. Instructions mailed to pt.

## 2021-01-11 LAB — HEPATITIS A ANTIBODY, IGM: Hep A IgM: NEGATIVE

## 2021-01-11 LAB — HEPATITIS B SURFACE ANTIBODY,QUALITATIVE: Hep B Surface Ab, Qual: NONREACTIVE

## 2021-01-11 LAB — COMPREHENSIVE METABOLIC PANEL
ALT: 51 IU/L — ABNORMAL HIGH (ref 0–32)
AST: 120 IU/L — ABNORMAL HIGH (ref 0–40)
Albumin/Globulin Ratio: 1.8 (ref 1.2–2.2)
Albumin: 4.5 g/dL (ref 3.8–4.8)
Alkaline Phosphatase: 71 IU/L (ref 44–121)
BUN/Creatinine Ratio: 13 (ref 12–28)
BUN: 10 mg/dL (ref 8–27)
Bilirubin Total: 0.7 mg/dL (ref 0.0–1.2)
CO2: 24 mmol/L (ref 20–29)
Calcium: 10.5 mg/dL — ABNORMAL HIGH (ref 8.7–10.3)
Chloride: 104 mmol/L (ref 96–106)
Creatinine, Ser: 0.8 mg/dL (ref 0.57–1.00)
Globulin, Total: 2.5 g/dL (ref 1.5–4.5)
Glucose: 123 mg/dL — ABNORMAL HIGH (ref 65–99)
Potassium: 4.4 mmol/L (ref 3.5–5.2)
Sodium: 145 mmol/L — ABNORMAL HIGH (ref 134–144)
Total Protein: 7 g/dL (ref 6.0–8.5)
eGFR: 82 mL/min/{1.73_m2} (ref >59)

## 2021-01-11 LAB — ANA: ANA Titer 1: NEGATIVE

## 2021-01-11 LAB — FERRITIN: Ferritin: 434 ng/mL — ABNORMAL HIGH (ref 15–150)

## 2021-01-11 LAB — ALPHA-1-ANTITRYPSIN: A-1 Antitrypsin: 184 mg/dL (ref 101–187)

## 2021-01-11 LAB — HEPATITIS C ANTIBODY: Hep C Virus Ab: <0.1 s/co ratio (ref 0.0–0.9)

## 2021-01-11 LAB — IRON AND TIBC
Iron Saturation: 36 % (ref 15–55)
Iron: 121 ug/dL (ref 27–139)
Total Iron Binding Capacity: 333 ug/dL (ref 250–450)
UIBC: 212 ug/dL (ref 118–369)

## 2021-01-11 LAB — SPECIMEN STATUS REPORT

## 2021-01-11 LAB — IMMUNOGLOBULINS A/E/G/M, SERUM
IgA/Immunoglobulin A, Serum: 277 mg/dL (ref 87–352)
IgE (Immunoglobulin E), Serum: 10 IU/mL (ref 6–495)
IgG (Immunoglobin G), Serum: 896 mg/dL (ref 586–1602)
IgM (Immunoglobulin M), Srm: 181 mg/dL (ref 26–217)

## 2021-01-11 LAB — CERULOPLASMIN: Ceruloplasmin: 21 mg/dL (ref 19.0–39.0)

## 2021-01-11 LAB — HEPATITIS B SURFACE ANTIBODY, QUANTITATIVE: Hepatitis B Surf Ab Quant: <3.1 m[IU]/mL — ABNORMAL LOW (ref >9.9)

## 2021-01-11 LAB — MITOCHONDRIAL ANTIBODIES: Mitochondrial Ab: <20 Units (ref 0.0–20.0)

## 2021-01-11 LAB — HEPATITIS B SURFACE ANTIGEN: Hepatitis B Surface Ag: NEGATIVE

## 2021-01-16 ENCOUNTER — Ambulatory Visit (HOSPITAL_COMMUNITY)
Admission: RE | Admit: 2021-01-16 | Discharge: 2021-01-16 | Disposition: A | Discharge status: Home / Self Care | Payer: Medicare Other | Source: Ambulatory Visit | Attending: Internal Medicine | Admitting: Internal Medicine

## 2021-01-16 ENCOUNTER — Encounter: Payer: Self-pay | Admitting: Family Medicine

## 2021-01-16 ENCOUNTER — Ambulatory Visit (INDEPENDENT_AMBULATORY_CARE_PROVIDER_SITE_OTHER): Payer: Medicare Other | Admitting: Family Medicine

## 2021-01-16 ENCOUNTER — Other Ambulatory Visit: Payer: Self-pay

## 2021-01-16 VITALS — BP 124/76 | HR 101 | Temp 98.6°F | Resp 16 | Ht 62.0 in | Wt 204.0 lb

## 2021-01-16 DIAGNOSIS — M1712 Unilateral primary osteoarthritis, left knee: Secondary | ICD-10-CM

## 2021-01-16 DIAGNOSIS — F419 Anxiety disorder, unspecified: Secondary | ICD-10-CM

## 2021-01-16 DIAGNOSIS — R7303 Prediabetes: Secondary | ICD-10-CM

## 2021-01-16 DIAGNOSIS — E782 Mixed hyperlipidemia: Secondary | ICD-10-CM

## 2021-01-16 DIAGNOSIS — Z136 Encounter for screening for cardiovascular disorders: Secondary | ICD-10-CM | POA: Diagnosis not present

## 2021-01-16 DIAGNOSIS — I10 Essential (primary) hypertension: Secondary | ICD-10-CM | POA: Diagnosis not present

## 2021-01-16 DIAGNOSIS — G894 Chronic pain syndrome: Secondary | ICD-10-CM | POA: Diagnosis not present

## 2021-01-16 DIAGNOSIS — G8929 Other chronic pain: Secondary | ICD-10-CM | POA: Diagnosis not present

## 2021-01-16 DIAGNOSIS — R112 Nausea with vomiting, unspecified: Secondary | ICD-10-CM | POA: Insufficient documentation

## 2021-01-16 DIAGNOSIS — I700 Atherosclerosis of aorta: Secondary | ICD-10-CM | POA: Diagnosis not present

## 2021-01-16 DIAGNOSIS — M25562 Pain in left knee: Secondary | ICD-10-CM

## 2021-01-16 DIAGNOSIS — R7401 Elevation of levels of liver transaminase levels: Secondary | ICD-10-CM

## 2021-01-16 LAB — POCT GLYCOSYLATED HEMOGLOBIN (HGB A1C)
HbA1c POC (<> result, manual entry): 5.8 % (ref 4.0–5.6)
HbA1c, POC (controlled diabetic range): 5.8 % (ref 0.0–7.0)
HbA1c, POC (prediabetic range): 5.8 % (ref 5.7–6.4)
Hemoglobin A1C: 5.8 % — AB (ref 4.0–5.6)

## 2021-01-16 MED ORDER — OXYCODONE HCL 5 MG PO TABS: ORAL_TABLET | ORAL | 0 refills | Status: DC

## 2021-01-16 MED ORDER — METHYLPREDNISOLONE ACETATE 80 MG/ML IJ SUSP
80.0000 mg | Freq: Once | INTRAMUSCULAR | Status: AC
Start: 2021-01-16 — End: 2021-01-16
  Administered 2021-01-16: 80 mg via INTRAMUSCULAR

## 2021-01-16 MED ORDER — PROMETHAZINE HCL 12.5 MG PO TABS: ORAL_TABLET | ORAL | 1 refills | Status: DC

## 2021-01-16 NOTE — Progress Notes (Signed)
OFFICE VISIT  01/16/2021  CC:  Chief Complaint  Patient presents with  . Annual Exam    Pt is not fasting    HPI:    Patient is a 65 y.o. Caucasian female who presents for 3 month f/u for chronic pain syndrome, HTN, HLD, GAD, and prediabetes. A/P as of last visit: "1) Acute bronchitis in the setting of COPD: patient reporting increased cough and mucus production in the setting of COPD. Lung exam unremarkable. Symptoms alleviated with PRN albuterol inhaler. Patient with similar presentation in the past. Discussed with pt and decided against use of prednisone at this time, though. - Continue PRN Albuterol - Chest X ray  2) Acute persistent nausea: potentially subacute/chronic cholecystitis. Right upper and lower quadrant tenderness to palpation on abdominal exam. Positive murphy sign. Patient reporting aversion to food secondary to nausea. Reassuring no fever, chills, and patient hemodynamically stable. Patient without diarrhea or constipation. - RUQ [place] - CBC w/diff, CMET, and Lipase today  3) Chronic pain synd: DDD lumbar, bilat end stage knee DJD. Unchanged. Responsible use of oxycodone. CSC and UDS UTD. Patient reports pain managed well. Continue current regimen. I did electronic rx's for oxycodone 5mg , 1-2 tid prn, #180 today for each of the next 3 months.  Appropriate fill on/after date was noted on each rx.   2) HTN:The current medical regimen is effective; continue present plan and medications.  3) HLD: tolerating statin and fibrate. FLP excellent 6 mo ago. Mild inc AST and ALT about 1 mo ago on rheum MD labs--has not had prob with inc LFTs in the past. FLP and hepatic panel today.  4) Anxiety, grief rxn: intol of citalopram (n/v). This is improving, patient denies increased anxiety. Patient reports anxiety well managed. Continue lorazepam 1mg  bid prn. No new rx for this needed today. CSC and UDS UTD.  5) Prediabetes: glucoses great. Hba1c <6 mo ago  6%. Continue diet. Hgb A1c today.  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 Patient reports significant improvement in arthritic pain. Was on low dose prednisone, currently on 2 week trial without prednisone. Followed by rheum."  INTERIM HX: Still struggling with chronic nausea and RUQ pain, intermittent distention per pt, eating makes it feel worse.  Says there is a point every day in which she has such bad nausea that if she eats she will vomit.  Asks for nausea med today.  She is still taking 1500 mg tylenol per day avg to try to help her pain in joints.  Still taking fenofib and rosuva.   Asks for nausea med.  She saw GI since I last saw her, for chronic RUQ pain and nausea, with hx of elev LFTs-->fatty liver the only finding on RUQ u/s I did on her 12/05/20. Viral hep serol neg. Plan is for EGD next month.  HTN: lisin 5mg  qd.  No home bp monitoring. Most recent lytes/cr normal < 2 wks ago.  HLD: lipids last visit 3 mo ago were great, pt has been tolerating fenofibrate and rosuva long term. Most recent LFTs through GI showed stable mild elev.   Chronic pain syndrome: Indication for chronic opioid:chronic bilat LBP w/out sciatica, and chronic pain from bilat osteoarthritis knees.  Says L knee pain flaring up signif the last couple days, asks for steroid injection, says it helps each time I've done it in the past. Medication and dose:oxycodone 5mg , 1-2 tid prn. # pills per month: #180 PMP AWARE reviewed today: most recent rx for oxycodon 5mg  was filled 12/26/20, #  180, rx by me.  Chronic anxiety well controlled with long term scheduled use of benzo with consistently appropriate/responsible use. Most recent rx for lorazepam 1mg  filled 12/24/20, #60, rx by me. No red flags.  ROS as above, plus--> no fevers, no CP, no SOB, no wheezing, no cough, no dizziness, no HAs, no rashes, no melena/hematochezia.  No polyuria or polydipsia.   No focal weakness, paresthesias, or tremors.  No acute vision or  hearing abnormalities.  No dysuria or unusual/new urinary urgency or frequency.  No recent changes in lower legs. No palpitations.    Past Medical History:  Diagnosis Date  . Aortic valve stenosis    "very mild"  . Atypical chest pain 2007; 2015   cardiolyte neg, echo nl, cath showed mild/nonobstructive LAD disease  . Bilateral primary osteoarthritis of knee 06/30/2012   Right >L.  Right unicompartmental knee arthroplasty 10/25/18  . CAD (coronary artery disease)   . Chronic LBP    Multiple surgeries  . COPD (chronic obstructive pulmonary disease) ([place])   . COVID-19 virus infection 06/02/2019   Eval at [facility] ED and d/c'd home.  Admitted 06/12/19 with hypoxic RF and bilat infiltrates.  . DDD (degenerative disc disease)    spinal stenosis  . Elevated liver enzymes    2022->rheum d/c'd her methotrexate in response to rising (mild) AST and ALT elev on their labs 12/07/20.  Her abd u/s 12/05/20 showed fatty liver, o/w normal.  . Fatty liver   . History of GI bleed    NSAIDS  . Hyperlipidemia    mixed  . Microscopic hematuria    full w/ u unrevealing X 2  . Normal nuclear stress test 11/11 and 06/2014   negative, EF normal  . Palpitations 2006   48H holter neg  . Prediabetes    Highest A1c 6.1% as of 03/2017  . RBBB (right bundle branch block)   . Recurrent UTI   . Seronegative rheumatoid arthritis ([place]) 10/2019   multiple sites->pred started, methotrex started 10/2019; rheum to add humira as of 03/2020  . Tobacco dependence in remission    Quit fall 2015.      Past Surgical History:  Procedure Laterality Date  . ABDOMINAL HYSTERECTOMY  1997   DUB  . APPENDECTOMY  1984  . CARDIAC CATHETERIZATION  10/09/2005   no CAD, mildly elevated LVEDP, normal LV function (Dr. Gerrie Nordmann)  . CARDIAC CATHETERIZATION N/A 05/16/2015   Mild non-obstructive CAD--med mgmt.  Procedure: Left Heart Cath and Coronary Angiography;  Surgeon: Pixie Casino, MD;  Location: [facility];  Service:  Cardiovascular;  Laterality: N/A;  . CARDIOVASCULAR STRESS TEST  06/2014   normal lexiscan NST  . CARDIOVASCULAR STRESS TEST  2006   persantine - no ischemia, low risk   . KNEE ARTHROSCOPY Bilateral   . left wrist ganglion cyst excision  2010  . [place]   right iliac crest bone graft+metal instrumentation; 2005 metal removal and decompression, 2011 lumbar decompression 4-11, then stabilization/ instrumentation done 09-19-10: L2,L3, L5 left and L2 , L3, L4 right pedical remnant L4 left embedded. Left iliac crest bone graft-- Dr Velna Ochs  . LUMBAR SPINE SURGERY  02/10/2016   Dr. Velna Ochs: lumbar decompression, instrumentation removal, and fusion exploration--HELPED her a lot, esp her radicular leg pains.  . OOPHORECTOMY Right    cyst  . PARTIAL KNEE ARTHROPLASTY Right 11/04/2018   Procedure: UNICOMPARTMENTAL KNEE;  Surgeon: Renette Butters, MD;  Location: [facility];  Service: Orthopedics;  Laterality: Right;  Adductor Block  . TONSILLECTOMY  65 yrs old  . TONSILLECTOMY    . TRANSTHORACIC ECHOCARDIOGRAM  2006   EF=>55%, trace MR, mild TR, trace AV regurg, trace pulm valve regurg     Outpatient Medications Prior to Visit  Medication Sig Dispense Refill  . Adalimumab (HUMIRA [place]) Inject 40 mg into the skin. Every other week    . clopidogrel (PLAVIX) 75 MG tablet TAKE 1 TABLET(75 MG) BY MOUTH DAILY 90 tablet 3  . cyclobenzaprine (FLEXERIL) 10 MG tablet TAKE 1 TABLET BY MOUTH EVERY 8 HOURS AS NEEDED 90 tablet 3  . fenofibrate 160 MG tablet TAKE 1 TABLET BY MOUTH EVERY DAY WITH FOOD 90 tablet 3  . folic acid (FOLVITE) 1 MG tablet Take 1 mg by mouth daily.     Marland Kitchen lisinopril (ZESTRIL) 5 MG tablet TAKE 1 TABLET(5 MG) BY MOUTH DAILY 90 tablet 1  . LORazepam (ATIVAN) 1 MG tablet Take 1 tablet (1 mg total) by mouth 2 (two) times daily. 60 tablet 5  . pantoprazole (PROTONIX) 40 MG tablet TAKE 1 TABLET(40 MG) BY MOUTH DAILY 90 tablet 3  . rosuvastatin (CRESTOR) 40 MG tablet TAKE 1 TABLET(40  MG) BY MOUTH DAILY 90 tablet 3  . oxyCODONE (OXY IR/ROXICODONE) 5 MG immediate release tablet 1-2 po tid prn pain 180 tablet 0  . albuterol (VENTOLIN HFA) 108 (90 Base) MCG/ACT inhaler Inhale 2 puffs into the lungs every 4 (four) hours as needed for wheezing or shortness of breath (bronchitis). Reported on 03/12/2016 (Patient not taking: Reported on 01/16/2021) 18 g 1  . nitroGLYCERIN (NITROSTAT) 0.4 MG SL tablet Place 1 tablet (0.4 mg total) under the tongue every 5 (five) minutes as needed for chest pain (MAX 3 doses.). (Patient not taking: Reported on 01/16/2021) 10 tablet 1  . acetaminophen (TYLENOL) 500 MG tablet every 6 (six) hours as needed for mild pain or headache. (Patient not taking: Reported on 01/16/2021)    . methotrexate 2.5 MG tablet Take 2.5 mg by mouth once a week. Pt takes 8 pills. (Patient not taking: No sig reported)    . sodium chloride (OCEAN) 0.65 % SOLN nasal spray Place 1 spray into both nostrils as needed for congestion. (Patient not taking: Reported on 01/16/2021)     No facility-administered medications prior to visit.    Allergies  Allergen Reactions  . Aspirin Other (See Comments)     GI Bleed  . Beta Adrenergic Blockers Other (See Comments)    REACTION: decreased libido  . Citalopram Nausea And Vomiting  . Nsaids Other (See Comments)     GI Upset  . Penicillins Swelling and Other (See Comments)    DID THE REACTION INVOLVE: Swelling of the face/tongue/throat, SOB, or low BP? Yes Sudden or severe rash/hives, skin peeling, or the inside of the mouth or nose? Yes Did it require medical treatment? Yes When did it last happen? 65 years old If all above answers are "NO", may proceed with cephalosporin use.   Marland Kitchen Prochlorperazine Edisylate Other (See Comments)     Stroke like symptoms  . Sulfonamide Derivatives Other (See Comments)    Unknown allergic reaction  . Amitriptyline Hcl Palpitations and Other (See Comments)     increased heart rate    ROS As per  HPI  PE: Vitals with BMI 01/16/2021 01/05/2021 10/17/2020  Height 5\' 2"  5\' 2"  5\' 4"   Weight 204 lbs 201 lbs 13 oz 216 lbs 3 oz  BMI 37.3 80.9 98.33  Systolic  672 094 709  Diastolic 76 75 73  Pulse 628 92 92     Gen: Alert, well appearing.  Patient is oriented to person, place, time, and situation. CV: RRR, no m/r/g.   LUNGS: CTA bilat, nonlabored resps, good aeration in all lung fields. EXT: no clubbing or cyanosis.  no edema.    LABS:  Lab Results  Component Value Date   TSH 0.42 09/24/2019   Lab Results  Component Value Date   WBC 9.7 12/08/2020   HGB 14.4 12/08/2020   HCT 44 12/08/2020   MCV 95.5 10/17/2020   PLT 189 12/08/2020   Lab Results  Component Value Date   CREATININE 0.80 01/05/2021   BUN 10 01/05/2021   NA 145 (H) 01/05/2021   K 4.4 01/05/2021   CL 104 01/05/2021   CO2 24 01/05/2021   Lab Results  Component Value Date   LIPASE 58.0 10/17/2020   Lab Results  Component Value Date   ALT 51 (H) 01/05/2021   AST 120 (H) 01/05/2021   ALKPHOS 71 01/05/2021   BILITOT 0.7 01/05/2021   Lab Results  Component Value Date   CHOL 99 10/17/2020   Lab Results  Component Value Date   HDL 33.10 (L) 10/17/2020   Lab Results  Component Value Date   LDLCALC 33 10/17/2020   Lab Results  Component Value Date   TRIG 163.0 (H) 10/17/2020   Lab Results  Component Value Date   CHOLHDL 3 10/17/2020   Lab Results  Component Value Date   HGBA1C 5.8 (A) 01/16/2021   HGBA1C 5.8 01/16/2021   HGBA1C 5.8 01/16/2021   HGBA1C 5.8 01/16/2021   POC Hba1c today = 5.8%  IMPRESSION AND PLAN:  1) Chronic pain syndrome.  Stop tylenol. Cont oxycodone 5mg , 1-2 tid prn, #180---rx's eRx'd for each of the next 3 mo.  Approp fill on/after dates on rx's. CSC and UDS UTD. For her acute-on-chronic L knee osteoarthritis pain, will give injection today:  Procedure: Therapeutic L knee injection.  The patient's clinical condition is marked by substantial pain and/or  significant functional disability.  Other conservative therapy has not provided relief, is contraindicated, or not appropriate.  There is a reasonable likelihood that injection will significantly improve the patient's pain and/or functional disability. Cleaned skin with alcohol swab, used anterior approach to enter knee joint, Injected 16ml of 80mg /ml depo medrol + 2 ml of plain 1% lidocaine into joint space without resistance.  No immediate complications.  Patient tolerated procedure well.  Post-injection care discussed, including 20 min of icing 1-2 times in the next 4-8 hours, frequent non weight-bearing ROM exercises over the next few days, and general pain medication management.  2) HTN: stable.  Cont lisin 5mg  qd. Lytes/cr good 01/05/21.  3) HLD: tolerating rosuva and fenofibrate long term but we'll hold these starting now since her LFTs have been staying up some.  4) Elevated LFTs:  Fatty liver+  Methotrexate toxicity.   Methotrexate already being held. Will hold statin and fibrate for now.  Stop tylenol. Extensive lab w/u by GI fortunately was unrevealing.  5) Chronic nausea and RUQ pain:  U/S showed only fatty liver changes. GI planning on doing EGD next month. Phenergan 12.5, 1-2 tabs q6h prn rx'd today.  6) Prediabetes: POC A1c today 5.8%.  7) Chronic anxiety: stable on scheduled loraz 1mg  bid. CSC and UDS UTD.  An After Visit Summary was printed and given to the patient.  FOLLOW UP: Return in about 3 months (around  04/18/2021) for annual CPE (fasting).  Signed:  Crissie Sickles, MD           01/16/2021

## 2021-02-07 NOTE — Patient Instructions (Signed)
Brenda [name]  02/07/2021     @PREFPERIOPPHARMACY @   Your procedure is scheduled on 02/14/21.  Report to [facility] at 7:15 A.M.  Call this number if you have problems the morning of surgery:  [phone]   Remember:  Do not eat or drink after midnight.  Take these medicines the morning of surgery with A SIP OF WATER Flexeril, Lisinopril, Ativan, Oxy IR, Protonix, Phenergan, Albuterol    Do not wear jewelry, make-up or nail polish.  Do not wear lotions, powders, or perfumes, or deodorant.  Do not shave 48 hours prior to surgery.  Men may shave face and neck.  Do not bring valuables to the hospital.  [facility] is not responsible for any belongings or valuables.  Contacts, dentures or bridgework may not be worn into surgery.  Leave your suitcase in the car.  After surgery it may be brought to your room.  For patients admitted to the hospital, discharge time will be determined by your treatment team.  Patients discharged the day of surgery will not be allowed to drive home.    Please read over the following fact sheets that you were given. Anesthesia Post-op Instructions      PATIENT INSTRUCTIONS POST-ANESTHESIA  IMMEDIATELY FOLLOWING SURGERY:  Do not drive or operate machinery for the first twenty four hours after surgery.  Do not make any important decisions for twenty four hours after surgery or while taking narcotic pain medications or sedatives.  If you develop intractable nausea and vomiting or a severe headache please notify your doctor immediately.  FOLLOW-UP:  Please make an appointment with your surgeon as instructed. You do not need to follow up with anesthesia unless specifically instructed to do so.  WOUND CARE INSTRUCTIONS (if applicable):  Keep a dry clean dressing on the anesthesia/puncture wound site if there is drainage.  Once the wound has quit draining you may leave it open to air.  Generally you should leave the bandage intact for twenty four hours  unless there is drainage.  If the epidural site drains for more than 36-48 hours please call the anesthesia department.  QUESTIONS?:  Please feel free to call your physician or the hospital operator if you have any questions, and they will be happy to assist you.      Upper Endoscopy, Adult Upper endoscopy is a procedure to look inside the upper GI (gastrointestinal) tract. The upper GI tract is made up of:  The part of the body that moves food from your mouth to your stomach (esophagus).  The stomach.  The first part of your small intestine (duodenum). This procedure is also called esophagogastroduodenoscopy (EGD) or gastroscopy. In this procedure, your health care provider passes a thin, flexible tube (endoscope) through your mouth and down your esophagus into your stomach. A small camera is attached to the end of the tube. Images from the camera appear on a monitor in the exam room. During this procedure, your health care provider may also remove a small piece of tissue to be sent to a lab and examined under a microscope (biopsy). Your health care provider may do an upper endoscopy to diagnose cancers of the upper GI tract. You may also have this procedure to find the cause of other conditions, such as:  Stomach pain.  Heartburn.  Pain or problems when swallowing.  Nausea and vomiting.  Stomach bleeding.  Stomach ulcers. Tell a health care provider about:  Any allergies you have.  All medicines you are  taking, including vitamins, herbs, eye drops, creams, and over-the-counter medicines.  Any problems you or family members have had with anesthetic medicines.  Any blood disorders you have.  Any surgeries you have had.  Any medical conditions you have.  Whether you are pregnant or may be pregnant. What are the risks? Generally, this is a safe procedure. However, problems may occur, including:  Infection.  Bleeding.  Allergic reactions to medicines.  A tear or hole  (perforation) in the esophagus, stomach, or duodenum. What happens before the procedure? Staying hydrated Follow instructions from your health care provider about hydration, which may include:  Up to 2 hours before the procedure - you may continue to drink clear liquids, such as water, clear fruit juice, black coffee, and plain tea.   Eating and drinking restrictions Follow instructions from your health care provider about eating and drinking, which may include:  8 hours before the procedure - stop eating heavy meals or foods, such as meat, fried foods, or fatty foods.  6 hours before the procedure - stop eating light meals or foods, such as toast or cereal.  6 hours before the procedure - stop drinking milk or drinks that contain milk.  2 hours before the procedure - stop drinking clear liquids. Medicines Ask your health care provider about:  Changing or stopping your regular medicines. This is especially important if you are taking diabetes medicines or blood thinners.  Taking medicines such as aspirin and ibuprofen. These medicines can thin your blood. Do not take these medicines unless your health care provider tells you to take them.  Taking over-the-counter medicines, vitamins, herbs, and supplements. General instructions  Plan to have someone take you home from the hospital or clinic.  If you will be going home right after the procedure, plan to have someone with you for 24 hours.  Ask your health care provider what steps will be taken to help prevent infection. What happens during the procedure?  An IV will be inserted into one of your veins.  You may be given one or more of the following: ? A medicine to help you relax (sedative). ? A medicine to numb the throat (local anesthetic).  You will lie on your left side on an exam table.  Your health care provider will pass the endoscope through your mouth and down your esophagus.  Your health care provider will use the  scope to check the inside of your esophagus, stomach, and duodenum. Biopsies may be taken.  The endoscope will be removed. The procedure may vary among health care providers and hospitals.   What happens after the procedure?  Your blood pressure, heart rate, breathing rate, and blood oxygen level will be monitored until you leave the hospital or clinic.  Do not drive for 24 hours if you were given a sedative during your procedure.  When your throat is no longer numb, you may be given some fluids to drink.  It is up to you to get the results of your procedure. Ask your health care provider, or the department that is doing the procedure, when your results will be ready. Summary  Upper endoscopy is a procedure to look inside the upper GI tract.  During the procedure, an IV will be inserted into one of your veins. You may be given a medicine to help you relax.  A medicine will be used to numb your throat.  The endoscope will be passed through your mouth and down your esophagus. This information is  not intended to replace advice given to you by your health care provider. Make sure you discuss any questions you have with your health care provider. Document Revised: 04/16/2018 Document Reviewed: 03/24/2018 Elsevier Patient Education  2021 [employer].

## 2021-02-10 ENCOUNTER — Encounter (HOSPITAL_COMMUNITY): Payer: Self-pay

## 2021-02-10 ENCOUNTER — Other Ambulatory Visit: Payer: Self-pay

## 2021-02-10 ENCOUNTER — Other Ambulatory Visit (HOSPITAL_COMMUNITY)
Admission: RE | Admit: 2021-02-10 | Discharge: 2021-02-10 | Disposition: A | Discharge status: Home / Self Care | Payer: Medicare HMO | Source: Ambulatory Visit | Attending: Internal Medicine | Admitting: Internal Medicine

## 2021-02-10 ENCOUNTER — Encounter (HOSPITAL_COMMUNITY)
Admission: RE | Admit: 2021-02-10 | Discharge: 2021-02-10 | Disposition: A | Discharge status: Home / Self Care | Payer: Medicare HMO | Source: Ambulatory Visit | Attending: Internal Medicine | Admitting: Internal Medicine

## 2021-02-10 DIAGNOSIS — Z01818 Encounter for other preprocedural examination: Secondary | ICD-10-CM | POA: Insufficient documentation

## 2021-02-10 DIAGNOSIS — Z20822 Contact with and (suspected) exposure to covid-19: Secondary | ICD-10-CM | POA: Insufficient documentation

## 2021-02-10 HISTORY — DX: Gastro-esophageal reflux disease without esophagitis: K21.9

## 2021-02-10 HISTORY — DX: Other complications of anesthesia, initial encounter: T88.59XA

## 2021-02-10 LAB — SARS CORONAVIRUS 2 (TAT 6-24 HRS): SARS Coronavirus 2: NEGATIVE

## 2021-02-13 ENCOUNTER — Other Ambulatory Visit: Payer: Self-pay

## 2021-02-13 ENCOUNTER — Telehealth: Payer: Self-pay

## 2021-02-13 NOTE — Telephone Encounter (Signed)
Brenda [name] at pre-service center called office and Brenda [name]. EGD for tomorrow needs PA. [phone], ext [phone])  PA for EGD submitted via [employer]. [employer]# [id number], valid 02/14/21-03/16/21.  Called Brenda [name] and informed him of PA.

## 2021-02-14 ENCOUNTER — Ambulatory Visit (HOSPITAL_COMMUNITY): Payer: Medicare HMO | Admitting: Anesthesiology

## 2021-02-14 ENCOUNTER — Ambulatory Visit (HOSPITAL_COMMUNITY)
Admission: RE | Admit: 2021-02-14 | Discharge: 2021-02-14 | Disposition: A | Discharge status: Home / Self Care | Payer: Medicare HMO | Attending: Internal Medicine | Admitting: Internal Medicine

## 2021-02-14 ENCOUNTER — Encounter (HOSPITAL_COMMUNITY)
Admission: RE | Disposition: A | Discharge status: Home / Self Care | Payer: Self-pay | Source: Home / Self Care | Attending: Internal Medicine

## 2021-02-14 ENCOUNTER — Encounter (HOSPITAL_COMMUNITY): Payer: Self-pay

## 2021-02-14 DIAGNOSIS — Z8616 Personal history of COVID-19: Secondary | ICD-10-CM | POA: Insufficient documentation

## 2021-02-14 DIAGNOSIS — Z888 Allergy status to other drugs, medicaments and biological substances status: Secondary | ICD-10-CM | POA: Diagnosis not present

## 2021-02-14 DIAGNOSIS — Z882 Allergy status to sulfonamides status: Secondary | ICD-10-CM | POA: Insufficient documentation

## 2021-02-14 DIAGNOSIS — Z791 Long term (current) use of non-steroidal anti-inflammatories (NSAID): Secondary | ICD-10-CM | POA: Insufficient documentation

## 2021-02-14 DIAGNOSIS — K529 Noninfective gastroenteritis and colitis, unspecified: Secondary | ICD-10-CM | POA: Diagnosis not present

## 2021-02-14 DIAGNOSIS — K319 Disease of stomach and duodenum, unspecified: Secondary | ICD-10-CM | POA: Insufficient documentation

## 2021-02-14 DIAGNOSIS — Z880 Allergy status to penicillin: Secondary | ICD-10-CM | POA: Insufficient documentation

## 2021-02-14 DIAGNOSIS — Z886 Allergy status to analgesic agent status: Secondary | ICD-10-CM | POA: Diagnosis not present

## 2021-02-14 DIAGNOSIS — K3189 Other diseases of stomach and duodenum: Secondary | ICD-10-CM | POA: Diagnosis not present

## 2021-02-14 DIAGNOSIS — Z87891 Personal history of nicotine dependence: Secondary | ICD-10-CM | POA: Insufficient documentation

## 2021-02-14 DIAGNOSIS — R1011 Right upper quadrant pain: Secondary | ICD-10-CM | POA: Diagnosis not present

## 2021-02-14 DIAGNOSIS — Z8249 Family history of ischemic heart disease and other diseases of the circulatory system: Secondary | ICD-10-CM | POA: Insufficient documentation

## 2021-02-14 DIAGNOSIS — Z823 Family history of stroke: Secondary | ICD-10-CM | POA: Diagnosis not present

## 2021-02-14 DIAGNOSIS — M069 Rheumatoid arthritis, unspecified: Secondary | ICD-10-CM | POA: Insufficient documentation

## 2021-02-14 DIAGNOSIS — Z803 Family history of malignant neoplasm of breast: Secondary | ICD-10-CM | POA: Insufficient documentation

## 2021-02-14 DIAGNOSIS — Z7951 Long term (current) use of inhaled steroids: Secondary | ICD-10-CM | POA: Diagnosis not present

## 2021-02-14 DIAGNOSIS — Z809 Family history of malignant neoplasm, unspecified: Secondary | ICD-10-CM | POA: Diagnosis not present

## 2021-02-14 DIAGNOSIS — K219 Gastro-esophageal reflux disease without esophagitis: Secondary | ICD-10-CM | POA: Diagnosis not present

## 2021-02-14 DIAGNOSIS — Z800 Family history of malignant neoplasm of digestive organs: Secondary | ICD-10-CM | POA: Diagnosis not present

## 2021-02-14 DIAGNOSIS — Z841 Family history of disorders of kidney and ureter: Secondary | ICD-10-CM | POA: Insufficient documentation

## 2021-02-14 DIAGNOSIS — Z833 Family history of diabetes mellitus: Secondary | ICD-10-CM | POA: Insufficient documentation

## 2021-02-14 DIAGNOSIS — K2970 Gastritis, unspecified, without bleeding: Secondary | ICD-10-CM | POA: Diagnosis not present

## 2021-02-14 DIAGNOSIS — Z8349 Family history of other endocrine, nutritional and metabolic diseases: Secondary | ICD-10-CM | POA: Diagnosis not present

## 2021-02-14 DIAGNOSIS — Z79899 Other long term (current) drug therapy: Secondary | ICD-10-CM | POA: Diagnosis not present

## 2021-02-14 DIAGNOSIS — J449 Chronic obstructive pulmonary disease, unspecified: Secondary | ICD-10-CM | POA: Insufficient documentation

## 2021-02-14 DIAGNOSIS — Z7902 Long term (current) use of antithrombotics/antiplatelets: Secondary | ICD-10-CM | POA: Diagnosis not present

## 2021-02-14 DIAGNOSIS — Z7982 Long term (current) use of aspirin: Secondary | ICD-10-CM | POA: Insufficient documentation

## 2021-02-14 HISTORY — PX: ESOPHAGOGASTRODUODENOSCOPY: SHX1529

## 2021-02-14 HISTORY — PX: BIOPSY: SHX5522

## 2021-02-14 HISTORY — PX: ESOPHAGOGASTRODUODENOSCOPY (EGD) WITH PROPOFOL: SHX5813

## 2021-02-14 LAB — COMPREHENSIVE METABOLIC PANEL
ALT: 44 U/L (ref 0–44)
AST: 41 U/L (ref 15–41)
Albumin: 3.7 g/dL (ref 3.5–5.0)
Alkaline Phosphatase: 64 U/L (ref 38–126)
Anion gap: 11 (ref 5–15)
BUN: 6 mg/dL — ABNORMAL LOW (ref 8–23)
CO2: 28 mmol/L (ref 22–32)
Calcium: 9.3 mg/dL (ref 8.9–10.3)
Chloride: 104 mmol/L (ref 98–111)
Creatinine, Ser: 0.52 mg/dL (ref 0.44–1.00)
GFR, Estimated: >60 mL/min (ref >60)
Glucose, Bld: 104 mg/dL — ABNORMAL HIGH (ref 70–99)
Potassium: 3.8 mmol/L (ref 3.5–5.1)
Sodium: 143 mmol/L (ref 135–145)
Total Bilirubin: 1.3 mg/dL — ABNORMAL HIGH (ref 0.3–1.2)
Total Protein: 6.4 g/dL — ABNORMAL LOW (ref 6.5–8.1)

## 2021-02-14 SURGERY — ESOPHAGOGASTRODUODENOSCOPY (EGD) WITH PROPOFOL
Anesthesia: General

## 2021-02-14 MED ORDER — ONDANSETRON HCL 4 MG/2ML IJ SOLN
4.0000 mg | Freq: Once | INTRAMUSCULAR | Status: AC
  Administered 2021-02-14: 4 mg via INTRAVENOUS

## 2021-02-14 MED ORDER — LIDOCAINE HCL (CARDIAC) PF 100 MG/5ML IV SOSY
PREFILLED_SYRINGE | INTRAVENOUS | Status: DC | PRN
  Administered 2021-02-14: 50 mg via INTRAVENOUS

## 2021-02-14 MED ORDER — LACTATED RINGERS IV SOLN: INTRAVENOUS | Status: DC

## 2021-02-14 MED ORDER — PROPOFOL 10 MG/ML IV BOLUS
INTRAVENOUS | Status: DC | PRN
  Administered 2021-02-14: 30 mg via INTRAVENOUS
  Administered 2021-02-14: 110 mg via INTRAVENOUS

## 2021-02-14 MED ORDER — ONDANSETRON HCL 4 MG/2ML IJ SOLN
INTRAMUSCULAR | Status: AC
  Filled 2021-02-14: qty 2

## 2021-02-14 MED ORDER — STERILE WATER FOR IRRIGATION IR SOLN
Status: DC | PRN
  Administered 2021-02-14: 1.5 mL

## 2021-02-14 MED ORDER — LIDOCAINE VISCOUS HCL 2 % MT SOLN
15.0000 mL | Freq: Once | OROMUCOSAL | Status: AC
  Administered 2021-02-14: 15 mL via OROMUCOSAL

## 2021-02-14 MED ORDER — SODIUM CHLORIDE FLUSH 0.9 % IV SOLN
INTRAVENOUS | Status: AC
  Filled 2021-02-14: qty 10

## 2021-02-14 MED ORDER — PANTOPRAZOLE SODIUM 40 MG PO TBEC: 40.0000 mg | DELAYED_RELEASE_TABLET | Freq: Two times a day (BID) | ORAL | 5 refills | Status: DC

## 2021-02-14 NOTE — Anesthesia Preprocedure Evaluation (Addendum)
Anesthesia Evaluation  Patient identified by MRN, date of birth, ID band Patient awake    Reviewed: Allergy & Precautions, NPO status , Patient's Chart, lab work & pertinent test results  History of Anesthesia Complications (+) PONV and history of anesthetic complications  Airway Mallampati: II  TM Distance: >3 FB Neck ROM: Full    Dental  (+) Dental Advisory Given, Missing   Pulmonary pneumonia, COPD,  COPD inhaler, former smoker,    Pulmonary exam normal breath sounds clear to auscultation       Cardiovascular Exercise Tolerance: Poor + angina with exertion + CAD  Normal cardiovascular exam+ dysrhythmias + Valvular Problems/Murmurs AS  Rhythm:Regular Rate:Normal  Left ventricle: The cavity size was normal. There was mild focal basal hypertrophy. Systolic function was normal. The estimated ejection fraction was in the range of 55% to 60%. Wall motion was normal; there were no regional wall motion abnormalities. Left ventricular diastolic function parameters were normal.  - Aortic valve: A bicuspid morphology cannot be excluded; mildly thickened, moderately calcified leaflets. There was very mild stenosis. Mean gradient (S): 10 mm Hg. Valve area (VTI): 1.71  cm^2.  - Aorta: Ascending aortic diameter: 38 mm (S).  - Ascending aorta: The ascending aorta was mildly dilated.  - Mitral valve: There was trivial regurgitation.  - Atrial septum: There was increased thickness of the septum,  consistent with lipomatous hypertrophy.  - Tricuspid valve: There was trivial regurgitation.  - Pulmonic valve: There was trivial regurgitation   Neuro/Psych PSYCHIATRIC DISORDERS Anxiety  Neuromuscular disease    GI/Hepatic Neg liver ROS, GERD  Medicated and Controlled,  Endo/Other  negative endocrine ROS  Renal/GU negative Renal ROS     Musculoskeletal  (+) Arthritis , Osteoarthritis and Rheumatoid disorders,    Abdominal   Peds   Hematology negative hematology ROS (+)   Anesthesia Other Findings   Reproductive/Obstetrics                           Anesthesia Physical Anesthesia Plan  ASA: III  Anesthesia Plan: General   Post-op Pain Management:    Induction:   PONV Risk Score and Plan:   Airway Management Planned: Nasal Cannula and Natural Airway  Additional Equipment:   Intra-op Plan:   Post-operative Plan:   Informed Consent:   Plan Discussed with:   Anesthesia Plan Comments:         Anesthesia Quick Evaluation

## 2021-02-14 NOTE — Transfer of Care (Signed)
Immediate Anesthesia Transfer of Care Note  Patient: Brenda [name]  Procedure(s) Performed: ESOPHAGOGASTRODUODENOSCOPY (EGD) WITH PROPOFOL (N/A ) BIOPSY  Patient Location: PACU  Anesthesia Type:General  Level of Consciousness: awake and alert   Airway & Oxygen Therapy: Patient Spontanous Breathing  Post-op Assessment: Report given to RN and Post -op Vital signs reviewed and stable  Post vital signs: Reviewed and stable  Last Vitals:  Vitals Value Taken Time  BP 105/52   Temp    Pulse    Resp    SpO2 93     Last Pain:  Vitals:   02/14/21 0823  TempSrc:   PainSc: 2       Patients Stated Pain Goal: 6 (94/70/96 2836)  Complications: No complications documented.

## 2021-02-14 NOTE — Discharge Instructions (Signed)
EGD Discharge instructions Please read the instructions outlined below and refer to this sheet in the next few weeks. These discharge instructions provide you with general information on caring for yourself after you leave the hospital. Your doctor may also give you specific instructions. While your treatment has been planned according to the most current medical practices available, unavoidable complications occasionally occur. If you have any problems or questions after discharge, please call your doctor. ACTIVITY  You may resume your regular activity but move at a slower pace for the next 24 hours.   Take frequent rest periods for the next 24 hours.   Walking will help expel (get rid of) the air and reduce the bloated feeling in your abdomen.   No driving for 24 hours (because of the anesthesia (medicine) used during the test).   You may shower.   Do not sign any important legal documents or operate any machinery for 24 hours (because of the anesthesia used during the test).  NUTRITION  Drink plenty of fluids.   You may resume your normal diet.   Begin with a light meal and progress to your normal diet.   Avoid alcoholic beverages for 24 hours or as instructed by your caregiver.  MEDICATIONS  You may resume your normal medications unless your caregiver tells you otherwise.  WHAT YOU CAN EXPECT TODAY  You may experience abdominal discomfort such as a feeling of fullness or "gas" pains.  FOLLOW-UP  Your doctor will discuss the results of your test with you.  SEEK IMMEDIATE MEDICAL ATTENTION IF ANY OF THE FOLLOWING OCCUR:  Excessive nausea (feeling sick to your stomach) and/or vomiting.   Severe abdominal pain and distention (swelling).   Trouble swallowing.   Temperature over 101 F (37.8 C).   Rectal bleeding or vomiting of blood.   Your EGD revealed a mild amount inflammation in her stomach.  I took biopsies of this to rule infection with bacteria called H. Pylori. I  am going to increase your pantoprazole to 40 mg twice daily for the next 8 weeks at which point you can decrease back down to once daily.  Avoid NSAIDs.  I am going to recheck your liver tests today as well and I will follow up with you on this.  We may need to consider liver biopsy depending on results.  Follow-up with me in 6 weeks.   I hope you have a great rest of your week!  Brenda [name]. Abbey Chatters, D.O. Gastroenterology and Hepatology [facility]

## 2021-02-14 NOTE — H&P (Signed)
Primary Care Physician:  Tammi Sou, MD Primary Gastroenterologist:  Dr. Abbey Chatters  Pre-Procedure History & Physical: HPI:  Brenda [name] is a 65 y.o. female is here for an EGD for RUQ/epigastric abdominal pain. She notes chronic right upper quadrant pain has been going on for year though has recently worsened significantly over the last 2 months.  States it is intermittent, sometimes radiates to her back.  Also has associated nausea with occasional vomiting.  Notes that she vomited what appeared to be dark blood at one point.  She had an ultrasound which showed fatty liver, otherwise unremarkable.  Also has chronic diarrhea as well.  This is intermittent as well however.  Denies any melena hematochezia.  No previous colonoscopy but does note she gets stool cards every year for colon cancer screening and they have always been negative.  Also reports intermittent NSAID use..  She has underlying rheumatoid arthritis is chronically on methotrexate.  Past Medical History:  Diagnosis Date  . Aortic valve stenosis    "very mild"  . Atypical chest pain 2007; 2015   cardiolyte neg, echo nl, cath showed mild/nonobstructive LAD disease  . Bilateral primary osteoarthritis of knee 06/30/2012   Right >L.  Right unicompartmental knee arthroplasty 10/25/18  . CAD (coronary artery disease)   . Chronic LBP    Multiple surgeries  . Complication of anesthesia   . COPD (chronic obstructive pulmonary disease) ([place])   . COVID-19 virus infection 06/02/2019   Eval at [facility] ED and d/c'd home.  Admitted 06/12/19 with hypoxic RF and bilat infiltrates.  . DDD (degenerative disc disease)    spinal stenosis  . Elevated liver enzymes    2022->rheum d/c'd her methotrexate in response to rising (mild) AST and ALT elev on their labs 12/07/20.  Her abd u/s 12/05/20 showed fatty liver, o/w normal.  . Fatty liver   . GERD (gastroesophageal reflux disease)   . History of GI bleed    NSAIDS  . Hyperlipidemia    mixed  .  Microscopic hematuria    full w/ u unrevealing X 2  . Normal nuclear stress test 11/11 and 06/2014   negative, EF normal  . Palpitations 2006   48H holter neg  . PONV (postoperative nausea and vomiting)   . Prediabetes    Highest A1c 6.1% as of 03/2017  . RBBB (right bundle branch block)   . Recurrent UTI   . Seronegative rheumatoid arthritis ([place]) 10/2019   multiple sites->pred started, methotrex started 10/2019; rheum to add humira as of 03/2020  . Tobacco dependence in remission    Quit fall 2015.      Past Surgical History:  Procedure Laterality Date  . ABDOMINAL HYSTERECTOMY  1997   DUB  . APPENDECTOMY  1984  . CARDIAC CATHETERIZATION  10/09/2005   no CAD, mildly elevated LVEDP, normal LV function (Dr. Gerrie Nordmann)  . CARDIAC CATHETERIZATION N/A 05/16/2015   Mild non-obstructive CAD--med mgmt.  Procedure: Left Heart Cath and Coronary Angiography;  Surgeon: Pixie Casino, MD;  Location: [facility];  Service: Cardiovascular;  Laterality: N/A;  . CARDIOVASCULAR STRESS TEST  06/2014   normal lexiscan NST  . CARDIOVASCULAR STRESS TEST  2006   persantine - no ischemia, low risk   . KNEE ARTHROSCOPY Bilateral   . left wrist ganglion cyst excision  2010  . [place]   right iliac crest bone graft+metal instrumentation; 2005 metal removal and decompression, 2011 lumbar decompression 4-11, then stabilization/  instrumentation done 09-19-10: L2,L3, L5 left and L2 , L3, L4 right pedical remnant L4 left embedded. Left iliac crest bone graft-- Dr Velna Ochs  . LUMBAR SPINE SURGERY  02/10/2016   Dr. Velna Ochs: lumbar decompression, instrumentation removal, and fusion exploration--HELPED her a lot, esp her radicular leg pains.  . OOPHORECTOMY Right    cyst  . PARTIAL KNEE ARTHROPLASTY Right 11/04/2018   Procedure: UNICOMPARTMENTAL KNEE;  Surgeon: Renette Butters, MD;  Location: [facility];  Service: Orthopedics;  Laterality: Right;  Adductor Block  . TONSILLECTOMY  65 yrs old  .  TONSILLECTOMY    . TRANSTHORACIC ECHOCARDIOGRAM  2006   EF=>55%, trace MR, mild TR, trace AV regurg, trace pulm valve regurg     Prior to Admission medications   Medication Sig Start Date End Date Taking? Authorizing Provider  Adalimumab (HUMIRA) 40 MG/0.8ML PSKT Inject 40 mg into the skin every 14 (fourteen) days.   Yes [provider]  albuterol (VENTOLIN HFA) 108 (90 Base) MCG/ACT inhaler Inhale 2 puffs into the lungs every 4 (four) hours as needed for wheezing or shortness of breath (bronchitis). Reported on 03/12/2016 10/17/20  Yes McGowen, Adrian Blackwater, MD  clopidogrel (PLAVIX) 75 MG tablet TAKE 1 TABLET(75 MG) BY MOUTH DAILY Patient taking differently: Take 75 mg by mouth daily. 09/13/20  Yes McGowen, Adrian Blackwater, MD  cyclobenzaprine (FLEXERIL) 10 MG tablet TAKE 1 TABLET BY MOUTH EVERY 8 HOURS AS NEEDED Patient taking differently: Take 10 mg by mouth 3 (three) times daily as needed for muscle spasms. TAKE 1 TABLET BY MOUTH EVERY 8 HOURS AS NEEDED 10/17/20  Yes McGowen, Adrian Blackwater, MD  fenofibrate 160 MG tablet TAKE 1 TABLET BY MOUTH EVERY DAY WITH FOOD Patient taking differently: Take 160 mg by mouth daily. TAKE 1 TABLET BY MOUTH EVERY DAY WITH FOOD 09/13/20  Yes McGowen, Adrian Blackwater, MD  folic acid (FOLVITE) 1 MG tablet Take 1 mg by mouth daily.  10/27/19  Yes [provider]  lisinopril (ZESTRIL) 5 MG tablet TAKE 1 TABLET(5 MG) BY MOUTH DAILY Patient taking differently: Take 5 mg by mouth daily. 10/17/20  Yes McGowen, Adrian Blackwater, MD  LORazepam (ATIVAN) 1 MG tablet Take 1 tablet (1 mg total) by mouth 2 (two) times daily. 09/23/20  Yes McGowen, Adrian Blackwater, MD  oxyCODONE (OXY IR/ROXICODONE) 5 MG immediate release tablet 1-2 po tid prn pain Patient taking differently: Take 5-10 mg by mouth 3 (three) times daily as needed for severe pain. 1-2 po tid prn pain 01/16/21  Yes McGowen, Adrian Blackwater, MD  pantoprazole (PROTONIX) 40 MG tablet TAKE 1 TABLET(40 MG) BY MOUTH DAILY Patient taking differently:  Take 40 mg by mouth daily. 09/13/20  Yes McGowen, Adrian Blackwater, MD  promethazine (PHENERGAN) 12.5 MG tablet 1-2 tabs po q6h prn nausea Patient taking differently: Take 12.5-25 mg by mouth every 6 (six) hours as needed for nausea or vomiting. 1-2 tabs po q6h prn nausea 01/16/21  Yes McGowen, Adrian Blackwater, MD  rosuvastatin (CRESTOR) 40 MG tablet TAKE 1 TABLET(40 MG) BY MOUTH DAILY Patient taking differently: Take 40 mg by mouth daily. 09/13/20  Yes McGowen, Adrian Blackwater, MD  nitroGLYCERIN (NITROSTAT) 0.4 MG SL tablet Place 1 tablet (0.4 mg total) under the tongue every 5 (five) minutes as needed for chest pain (MAX 3 doses.). 09/24/19   McGowen, Adrian Blackwater, MD    Allergies as of 01/05/2021 - Review Complete 01/05/2021  Allergen Reaction Noted  . Aspirin Other (See Comments)   . Beta adrenergic blockers  Other (See Comments)   . Citalopram Nausea Only 04/19/2020  . Nsaids Other (See Comments)   . Penicillins Swelling and Other (See Comments)   . Prochlorperazine edisylate Other (See Comments)   . Sulfonamide derivatives Other (See Comments)   . Amitriptyline hcl Palpitations and Other (See Comments)     Family History  Problem Relation Age of Onset  . Heart Problems Mother        and thyroid problems  . Hyperlipidemia Mother   . Diabetes Mother   . Breast cancer Mother   . Heart failure Father        CHF, heart attack, diabetes, hyperlipidemia  . Heart disease Brother        back problems  . Hypertension Brother   . Kidney failure Maternal Grandmother   . Stroke Maternal Grandfather   . Cancer Paternal Grandmother        mouth - snuff  . Heart attack Paternal Grandfather        stroke, HTN  . Hyperlipidemia Brother   . Heart attack Brother   . Cancer Brother   . Coronary artery disease Neg Hx     Social History   Socioeconomic History  . Marital status: Married    Spouse name: Not on file  . Number of children: Not on file  . Years of education: Not on file  . Highest education level:  Not on file  Occupational History  . Not on file  Tobacco Use  . Smoking status: Former Smoker    Packs/day: 1.00    Years: 35.00    Pack years: 35.00    Types: Cigarettes    Quit date: 07/22/2014    Years since quitting: 6.5  . Smokeless tobacco: Never Used  . Tobacco comment: down to ~2 cigarettes/daily (06/23/14) - Quit Smoking around 07/20/14!  Vaping Use  . Vaping Use: Never used  Substance and Sexual Activity  . Alcohol use: No    Alcohol/week: 0.0 standard drinks  . Drug use: No  . Sexual activity: Yes    Partners: Male    Birth control/protection: Surgical  Other Topics Concern  . Not on file  Social History Narrative  . Not on file   Social Determinants of Health   Financial Resource Strain: Low Risk   . Difficulty of Paying Living Expenses: Not hard at all  Food Insecurity: No Food Insecurity  . Worried About Charity fundraiser in the Last Year: Never true  . Ran Out of Food in the Last Year: Never true  Transportation Needs: No Transportation Needs  . Lack of Transportation (Medical): No  . Lack of Transportation (Non-Medical): No  Physical Activity: Inactive  . Days of Exercise per Week: 0 days  . Minutes of Exercise per Session: 0 min  Stress: No Stress Concern Present  . Feeling of Stress : Not at all  Social Connections: Moderately Integrated  . Frequency of Communication with Friends and Family: More than three times a week  . Frequency of Social Gatherings with Friends and Family: More than three times a week  . Attends Religious Services: 1 to 4 times per year  . Active Member of Clubs or Organizations: No  . Attends Archivist Meetings: Never  . Marital Status: Married  Human resources officer Violence: Not At Risk  . Fear of Current or Ex-Partner: No  . Emotionally Abused: No  . Physically Abused: No  . Sexually Abused: No    Review of Systems: See  HPI, otherwise negative ROS  Physical Exam: Vital signs in last 24 hours: Temp:  [98.5  F (36.9 C)] 98.5 F (36.9 C) (04/12 0704) Pulse Rate:  [77] 77 (04/12 0704) Resp:  [18] 18 (04/12 0704) BP: (125)/(72) 125/72 (04/12 0704) SpO2:  [97 %] 97 % (04/12 0704)   General:   Alert,  Well-developed, well-nourished, pleasant and cooperative in NAD Head:  Normocephalic and atraumatic. Eyes:  Sclera clear, no icterus.   Conjunctiva pink. Ears:  Normal auditory acuity. Nose:  No deformity, discharge,  or lesions. Mouth:  No deformity or lesions, dentition normal. Neck:  Supple; no masses or thyromegaly. Lungs:  Clear throughout to auscultation.   No wheezes, crackles, or rhonchi. No acute distress. Heart:  Regular rate and rhythm; no murmurs, clicks, rubs,  or gallops. Abdomen:  Soft, nontender and nondistended. No masses, hepatosplenomegaly or hernias noted. Normal bowel sounds, without guarding, and without rebound.   Msk:  Symmetrical without gross deformities. Normal posture. Extremities:  Without clubbing or edema. Neurologic:  Alert and  oriented x4;  grossly normal neurologically. Skin:  Intact without significant lesions or rashes. Cervical Nodes:  No significant cervical adenopathy. Psych:  Alert and cooperative. Normal mood and affect.  Impression/Plan: Brenda [name] is here for an EGD for RUQ/epigastric abdominal pain.  The risks of the procedure including infection, bleed, or perforation as well as benefits, limitations, alternatives and imponderables have been reviewed with the patient. Questions have been answered. All parties agreeable.

## 2021-02-14 NOTE — Op Note (Signed)
[facility] Patient Name: Brenda [name] Procedure Date: 02/14/2021 8:22 AM MRN: [id number] Date of Birth: [birth year] Attending MD: Elon Alas. Abbey Chatters DO CSN: [id number] Age: 65 Admit Type: Outpatient Procedure:                Upper GI endoscopy Indications:              Abdominal pain in the right upper quadrant Providers:                Elon Alas. Abbey Chatters, DO, Caprice Kluver, Raphael Gibney,                            Technician Referring MD:              Medicines:                See the Anesthesia note for documentation of the                            administered medications Complications:            No immediate complications. Estimated Blood Loss:     Estimated blood loss was minimal. Procedure:                Pre-Anesthesia Assessment:                           - The anesthesia plan was to use monitored                            anesthesia care (MAC).                           After obtaining informed consent, the endoscope was                            passed under direct vision. Throughout the                            procedure, the patient's blood pressure, pulse, and                            oxygen saturations were monitored continuously. The                            [id number]-H190 ([id number]) scope was introduced through the                            mouth, and advanced to the second part of duodenum.                            The upper GI endoscopy was accomplished without                            difficulty. The patient tolerated the procedure                            well. Scope  In: 8:27:24 AM Scope Out: 8:30:59 AM Total Procedure Duration: 0 hours 3 minutes 35 seconds  Findings:      The Z-line was regular and was found 40 cm from the incisors.      There is no endoscopic evidence of Barrett's esophagus, bleeding,       esophagitis, hiatal hernia, ulcerations or varices in the entire       esophagus.      Diffuse moderate inflammation characterized by  erosions and erythema was       found in the entire examined stomach. Biopsies were taken with a cold       forceps for Helicobacter pylori testing.      The duodenal bulb, first portion of the duodenum and second portion of       the duodenum were normal. Impression:               - Z-line regular, 40 cm from the incisors.                           - Gastritis. Biopsied.                           - Normal duodenal bulb, first portion of the                            duodenum and second portion of the duodenum. Moderate Sedation:      Per Anesthesia Care Recommendation:           - Patient has a contact number available for                            emergencies. The signs and symptoms of potential                            delayed complications were discussed with the                            patient. Return to normal activities tomorrow.                            Written discharge instructions were provided to the                            patient.                           - Resume previous diet.                           - Continue present medications.                           - Await pathology results.                           - Use Protonix (pantoprazole) 40 mg PO BID for 8  weeks.                           - Return to GI clinic in 6 weeks. Procedure Code(s):        --- Professional ---                           [phone], Esophagogastroduodenoscopy, flexible,                            transoral; with biopsy, single or multiple Diagnosis Code(s):        --- Professional ---                           K29.70, Gastritis, unspecified, without bleeding                           R10.11, Right upper quadrant pain CPT copyright 2019 American Medical Association. All rights reserved. The codes documented in this report are preliminary and upon coder review may  be revised to meet current compliance requirements. Elon Alas. Abbey Chatters, DO [place] Abbey Chatters,  DO 02/14/2021 8:33:34 AM This report has been signed electronically. Number of Addenda: 0

## 2021-02-14 NOTE — Progress Notes (Signed)
Patient c/o lots of nausea post procedure, Dr. Wyatt Haste notified and zofran administered per order with improvement of symptoms

## 2021-02-14 NOTE — Anesthesia Postprocedure Evaluation (Signed)
Anesthesia Post Note  Patient: Brenda [name]  Procedure(s) Performed: ESOPHAGOGASTRODUODENOSCOPY (EGD) WITH PROPOFOL (N/A ) BIOPSY  Patient location during evaluation: Phase II Anesthesia Type: General Level of consciousness: awake and alert and oriented Pain management: pain level controlled Vital Signs Assessment: post-procedure vital signs reviewed and stable Respiratory status: spontaneous breathing and respiratory function stable Cardiovascular status: blood pressure returned to baseline and stable Postop Assessment: no apparent nausea or vomiting Anesthetic complications: no   No complications documented.   Last Vitals:  Vitals:   02/14/21 0837 02/14/21 0843  BP: (!) 105/52 117/72  Pulse: 76   Resp: 20   Temp: 36.8 C   SpO2: 94% 95%    Last Pain:  Vitals:   02/14/21 0843  TempSrc:   PainSc: 0-No pain                 Jonathen Rathman C Selam Pietsch

## 2021-02-15 ENCOUNTER — Encounter: Payer: Self-pay | Admitting: Family Medicine

## 2021-02-15 LAB — SURGICAL PATHOLOGY

## 2021-02-20 ENCOUNTER — Encounter (HOSPITAL_COMMUNITY): Payer: Self-pay | Admitting: Internal Medicine

## 2021-03-07 DIAGNOSIS — I2510 Atherosclerotic heart disease of native coronary artery without angina pectoris: Secondary | ICD-10-CM | POA: Diagnosis not present

## 2021-03-07 DIAGNOSIS — M79673 Pain in unspecified foot: Secondary | ICD-10-CM | POA: Diagnosis not present

## 2021-03-07 DIAGNOSIS — M79643 Pain in unspecified hand: Secondary | ICD-10-CM | POA: Diagnosis not present

## 2021-03-07 DIAGNOSIS — M1990 Unspecified osteoarthritis, unspecified site: Secondary | ICD-10-CM | POA: Diagnosis not present

## 2021-03-07 DIAGNOSIS — J449 Chronic obstructive pulmonary disease, unspecified: Secondary | ICD-10-CM | POA: Diagnosis not present

## 2021-03-07 DIAGNOSIS — Z79899 Other long term (current) drug therapy: Secondary | ICD-10-CM | POA: Diagnosis not present

## 2021-03-07 DIAGNOSIS — M0609 Rheumatoid arthritis without rheumatoid factor, multiple sites: Secondary | ICD-10-CM | POA: Diagnosis not present

## 2021-03-07 DIAGNOSIS — M8589 Other specified disorders of bone density and structure, multiple sites: Secondary | ICD-10-CM | POA: Diagnosis not present

## 2021-03-07 DIAGNOSIS — E669 Obesity, unspecified: Secondary | ICD-10-CM | POA: Diagnosis not present

## 2021-03-07 DIAGNOSIS — M549 Dorsalgia, unspecified: Secondary | ICD-10-CM | POA: Diagnosis not present

## 2021-03-07 LAB — COMPREHENSIVE METABOLIC PANEL
Albumin: 4.6 (ref 3.5–5.0)
Calcium: 10.3 (ref 8.7–10.7)
GFR calc Af Amer: 109
GFR calc non Af Amer: 94

## 2021-03-07 LAB — BASIC METABOLIC PANEL
BUN: 9 (ref 4–21)
CO2: 27 — AB (ref 13–22)
Chloride: 104 (ref 99–108)
Creatinine: 0.6 (ref 0.5–1.1)
Glucose: 111
Potassium: 4.2 (ref 3.4–5.3)
Sodium: 144 (ref 137–147)

## 2021-03-07 LAB — HEPATIC FUNCTION PANEL
ALT: 69 — AB (ref 7–35)
AST: 94 — AB (ref 13–35)
Alkaline Phosphatase: 88 (ref 25–125)
Bilirubin, Total: 1

## 2021-03-07 LAB — CBC AND DIFFERENTIAL
HCT: 46 (ref 36–46)
Hemoglobin: 15.2 (ref 12.0–16.0)
Neutrophils Absolute: 37.6
Platelets: 181 (ref 150–399)
WBC: 8.9

## 2021-03-07 LAB — CBC: RBC: 4.85 (ref 3.87–5.11)

## 2021-03-08 DIAGNOSIS — M0609 Rheumatoid arthritis without rheumatoid factor, multiple sites: Secondary | ICD-10-CM | POA: Diagnosis not present

## 2021-03-13 ENCOUNTER — Encounter: Payer: Self-pay | Admitting: Family Medicine

## 2021-03-16 ENCOUNTER — Other Ambulatory Visit: Payer: Self-pay | Admitting: Family Medicine

## 2021-03-28 ENCOUNTER — Other Ambulatory Visit: Payer: Self-pay

## 2021-03-29 ENCOUNTER — Encounter: Payer: Self-pay | Admitting: Family Medicine

## 2021-03-29 ENCOUNTER — Other Ambulatory Visit: Payer: Self-pay

## 2021-03-29 ENCOUNTER — Ambulatory Visit (INDEPENDENT_AMBULATORY_CARE_PROVIDER_SITE_OTHER): Payer: Medicare HMO | Admitting: Family Medicine

## 2021-03-29 VITALS — BP 88/64 | HR 108 | Temp 97.9°F | Resp 16 | Ht 62.0 in | Wt 197.4 lb

## 2021-03-29 DIAGNOSIS — D849 Immunodeficiency, unspecified: Secondary | ICD-10-CM | POA: Diagnosis not present

## 2021-03-29 DIAGNOSIS — N12 Tubulo-interstitial nephritis, not specified as acute or chronic: Secondary | ICD-10-CM

## 2021-03-29 DIAGNOSIS — R300 Dysuria: Secondary | ICD-10-CM

## 2021-03-29 DIAGNOSIS — I959 Hypotension, unspecified: Secondary | ICD-10-CM | POA: Diagnosis not present

## 2021-03-29 LAB — POC URINALSYSI DIPSTICK (AUTOMATED)
Bilirubin, UA: NEGATIVE
Blood, UA: POSITIVE
Glucose, UA: NEGATIVE
Ketones, UA: NEGATIVE
Nitrite, UA: NEGATIVE
Protein, UA: POSITIVE — AB
Spec Grav, UA: 1.015 (ref 1.010–1.025)
Urobilinogen, UA: 1 E.U./dL
pH, UA: 6 (ref 5.0–8.0)

## 2021-03-29 MED ORDER — LORAZEPAM 1 MG PO TABS: 1.0000 mg | ORAL_TABLET | Freq: Two times a day (BID) | ORAL | 5 refills | Status: DC

## 2021-03-29 MED ORDER — CIPROFLOXACIN HCL 500 MG PO TABS: 500.0000 mg | ORAL_TABLET | Freq: Two times a day (BID) | ORAL | 0 refills | Status: AC

## 2021-03-29 NOTE — Telephone Encounter (Signed)
Pt made aware refill sent. 

## 2021-03-29 NOTE — Patient Instructions (Signed)
Don't take your lisinopril, fenofibrate, or rosuvastatin until you feel well again.

## 2021-03-29 NOTE — Progress Notes (Signed)
OFFICE VISIT  03/29/2021  CC:  Chief Complaint  Patient presents with  . Urinary pain    X6 days; Pressure in her bladder, frequency but unable to go.    HPI:    Patient is a 65 y.o. Caucasian female who presents for "urinary pain". Onset 6 d/a dysuria, urgency and frequency.  A couple days ago her L flank/CVA area started hurting.  Trace of nausea, no vomiting.  No abd pain, no radiating groin pain.  Urine slightly darker yellow but no blood in urine. Says she's eating and drinking well, home bp checks consistently low normal. Tm 100.8 4-5 n/a.    Most recent humira was > 2 wks ago; she skipped scheduled one a few days ago.  ROS as above, plus--> no fevers, no CP, no SOB, no wheezing, no cough, no dizziness, no HAs, no rashes, no melena/hematochezia.  No polyuria or polydipsia.  No myalgias or arthralgias.  No focal weakness, paresthesias, or tremors.  No acute vision or hearing abnormalities.  No recent changes in lower legs. No diarrhea. No palpitations.    Past Medical History:  Diagnosis Date  . Aortic valve stenosis    "very mild"  . Atypical chest pain 2007; 2015   cardiolyte neg, echo nl, cath showed mild/nonobstructive LAD disease  . Bilateral primary osteoarthritis of knee 06/30/2012   Right >L.  Right unicompartmental knee arthroplasty 10/25/18  . CAD (coronary artery disease)   . Chronic LBP    Multiple surgeries  . COPD (chronic obstructive pulmonary disease) ([place])   . COVID-19 virus infection 06/02/2019   Eval at [facility] ED and d/c'd home.  Admitted 06/12/19 with hypoxic RF and bilat infiltrates.  . DDD (degenerative disc disease)    spinal stenosis  . Elevated liver enzymes    2022->rheum d/c'd her methotrexate in response to rising (mild) AST and ALT elev on their labs 12/07/20.  Her abd u/s 12/05/20 showed fatty liver, o/w normal.  . Fatty liver    11/2020 liver u/s  . Gastritis    H pylori NEG on EGD 02/2021  . GERD (gastroesophageal reflux disease)   . History of  GI bleed    NSAIDS  . Hyperlipidemia    mixed  . Microscopic hematuria    full w/ u unrevealing X 2  . Normal nuclear stress test 11/11 and 06/2014   negative, EF normal  . Palpitations 2006   48H holter neg  . Prediabetes    Highest A1c 6.1% as of 03/2017  . RBBB (right bundle branch block)   . Recurrent UTI   . Seronegative rheumatoid arthritis ([place]) 10/2019   multiple sites->pred started, methotrex started 10/2019; rheum to add humira as of 03/2020  . Tobacco dependence in remission    Quit fall 2015.      Past Surgical History:  Procedure Laterality Date  . ABDOMINAL HYSTERECTOMY  1997   DUB  . APPENDECTOMY  1984  . BIOPSY  02/14/2021   Procedure: BIOPSY;  Surgeon: Eloise Harman, DO;  [facility];  Service: Endoscopy;;  . CARDIAC CATHETERIZATION  10/09/2005   no CAD, mildly elevated LVEDP, normal LV function (Dr. Gerrie Nordmann)  . CARDIAC CATHETERIZATION N/A 05/16/2015   Mild non-obstructive CAD--med mgmt.  Procedure: Left Heart Cath and Coronary Angiography;  Surgeon: Pixie Casino, MD;  Location: [place] [facility];  Service: Cardiovascular;  Laterality: N/A;  . CARDIOVASCULAR STRESS TEST  06/2014   normal lexiscan NST  . CARDIOVASCULAR STRESS TEST  2006   persantine - no ischemia, low risk   . ESOPHAGOGASTRODUODENOSCOPY  02/14/2021   Gastritis ( H pylori NEG) w/out bleeding  . ESOPHAGOGASTRODUODENOSCOPY (EGD) WITH PROPOFOL N/A 02/14/2021   Procedure: ESOPHAGOGASTRODUODENOSCOPY (EGD) WITH PROPOFOL;  Surgeon: Eloise Harman, DO;  [facility];  Service: Endoscopy;  Laterality: N/A;  AM  . KNEE ARTHROSCOPY Bilateral   . left wrist ganglion cyst excision  2010  . [place]   right iliac crest bone graft+metal instrumentation; 2005 metal removal and decompression, 2011 lumbar decompression 4-11, then stabilization/ instrumentation done 09-19-10: L2,L3, L5 left and L2 , L3, L4 right pedical remnant L4 left embedded. Left iliac crest  bone graft-- Dr Velna Ochs  . LUMBAR SPINE SURGERY  02/10/2016   Dr. Velna Ochs: lumbar decompression, instrumentation removal, and fusion exploration--HELPED her a lot, esp her radicular leg pains.  . OOPHORECTOMY Right    cyst  . PARTIAL KNEE ARTHROPLASTY Right 11/04/2018   Procedure: UNICOMPARTMENTAL KNEE;  Surgeon: Renette Butters, MD;  Location: [facility];  Service: Orthopedics;  Laterality: Right;  Adductor Block  . TONSILLECTOMY  65 yrs old  . TRANSTHORACIC ECHOCARDIOGRAM  2006   EF=>55%, trace MR, mild TR, trace AV regurg, trace pulm valve regurg     Outpatient Medications Prior to Visit  Medication Sig Dispense Refill  . Adalimumab (HUMIRA) 40 MG/0.8ML PSKT Inject 40 mg into the skin every 14 (fourteen) days.    Marland Kitchen albuterol (VENTOLIN HFA) 108 (90 Base) MCG/ACT inhaler Inhale 2 puffs into the lungs every 4 (four) hours as needed for wheezing or shortness of breath (bronchitis). Reported on 03/12/2016 18 g 1  . clopidogrel (PLAVIX) 75 MG tablet TAKE 1 TABLET(75 MG) BY MOUTH DAILY (Patient taking differently: Take 75 mg by mouth daily.) 90 tablet 3  . cyclobenzaprine (FLEXERIL) 10 MG tablet TAKE 1 TABLET BY MOUTH EVERY 8 HOURS AS NEEDED 90 tablet 3  . fenofibrate 160 MG tablet TAKE 1 TABLET BY MOUTH EVERY DAY WITH FOOD (Patient taking differently: Take 160 mg by mouth daily. TAKE 1 TABLET BY MOUTH EVERY DAY WITH FOOD) 90 tablet 3  . folic acid (FOLVITE) 1 MG tablet Take 1 mg by mouth daily.     Marland Kitchen lisinopril (ZESTRIL) 5 MG tablet TAKE 1 TABLET(5 MG) BY MOUTH DAILY (Patient taking differently: Take 5 mg by mouth daily.) 90 tablet 1  . LORazepam (ATIVAN) 1 MG tablet Take 1 tablet (1 mg total) by mouth 2 (two) times daily. 60 tablet 5  . oxyCODONE (OXY IR/ROXICODONE) 5 MG immediate release tablet 1-2 po tid prn pain (Patient taking differently: Take 5-10 mg by mouth 3 (three) times daily as needed for severe pain. 1-2 po tid prn pain) 180 tablet 0  . pantoprazole (PROTONIX) 40 MG tablet Take 1 tablet (40  mg total) by mouth 2 (two) times daily. 60 tablet 5  . promethazine (PHENERGAN) 12.5 MG tablet 1-2 tabs po q6h prn nausea (Patient taking differently: Take 12.5-25 mg by mouth every 6 (six) hours as needed for nausea or vomiting. 1-2 tabs po q6h prn nausea) 45 tablet 1  . rosuvastatin (CRESTOR) 40 MG tablet TAKE 1 TABLET(40 MG) BY MOUTH DAILY (Patient taking differently: Take 40 mg by mouth daily.) 90 tablet 3  . methotrexate 2.5 MG tablet TAKE 6 TABLETS BY MOUTH 1 TIME WEEKLY (Patient not taking: Reported on 03/29/2021)    . nitroGLYCERIN (NITROSTAT) 0.4 MG SL tablet Place 1 tablet (0.4 mg total) under the tongue every  5 (five) minutes as needed for chest pain (MAX 3 doses.). (Patient not taking: Reported on 03/29/2021) 10 tablet 1   No facility-administered medications prior to visit.    Allergies  Allergen Reactions  . Aspirin Other (See Comments)     GI Bleed  . Beta Adrenergic Blockers Other (See Comments)    REACTION: decreased libido  . Citalopram Nausea And Vomiting  . Nsaids Other (See Comments)     GI Upset  . Penicillins Swelling and Other (See Comments)    DID THE REACTION INVOLVE: Swelling of the face/tongue/throat, SOB, or low BP? Yes Sudden or severe rash/hives, skin peeling, or the inside of the mouth or nose? Yes Did it require medical treatment? Yes When did it last happen? 65 years old If all above answers are "NO", may proceed with cephalosporin use.   Marland Kitchen Prochlorperazine Edisylate Other (See Comments)     Stroke like symptoms  . Sulfonamide Derivatives Other (See Comments)    Unknown allergic reaction  . Amitriptyline Hcl Palpitations and Other (See Comments)     increased heart rate    ROS As per HPI  PE: Vitals with BMI 03/29/2021 02/14/2021 02/14/2021  Height 5\' 2"  - -  Weight 197 lbs 6 oz - -  BMI 09.2 - -  Systolic 88 330 076  Diastolic 64 72 52  Pulse 226 - 76  bp repeat at end of visit 110/70 Gen: Alert, tired- appearing but nontoxic.  Patient is  oriented to person, place, time, and situation.  Tearful d/t pain in side. CV: RRR, no m/r/g.   LUNGS: CTA bilat, nonlabored resps, good aeration in all lung fields. CV: Regular, mild tachycardia, no m/r/g.   LUNGS: CTA bilat, nonlabored resps, good aeration in all lung fields. L CVA tender is significant   LABS:    Chemistry      Component Value Date/Time   NA 143 02/14/2021 1007   NA 145 (H) 01/05/2021 1119   K 3.8 02/14/2021 1007   CL 104 02/14/2021 1007   CO2 28 02/14/2021 1007   BUN 6 (L) 02/14/2021 1007   BUN 10 01/05/2021 1119   CREATININE 0.52 02/14/2021 1007   CREATININE 0.69 06/29/2011 1607   GLU 121 12/08/2020 0000      Component Value Date/Time   CALCIUM 9.3 02/14/2021 1007   ALKPHOS 64 02/14/2021 1007   AST 41 02/14/2021 1007   ALT 44 02/14/2021 1007   BILITOT 1.3 (H) 02/14/2021 1007   BILITOT 0.7 01/05/2021 1119     POC CC dipstick UA today: 2+ blood, 3+ leuk, o/w normal  IMPRESSION AND PLAN:  1) Pyelonephritis.  Relatively immunosuppressed d/t being on humira. She feels horrible but I don't think she is septic. She is taking PO well. She has appropriately skipped most recent dose of humira. Start cipro 500mg  bid x 7d. Hold lisinopril, fibrate, and statin until feeling back to normal. CBC and bmet today.  Send urine for c/s.  An After Visit Summary was printed and given to the patient.  FOLLOW UP: Return in about 2 days (around 03/31/2021) for f/u pyelonephritis (I have 4:00 in person slot open). F/u chronic pain 3-4 wks  Signed:  Crissie Sickles, MD           03/29/2021

## 2021-03-29 NOTE — Telephone Encounter (Signed)
Requesting: lorazepam Contract: 01/16/21 UDS:12/28/19 Last Visit:01/16/21, [facility] Next Visit:03/29/21, acute Last Refill: 09/23/20 (60,5)  Please Advise. Medication pending

## 2021-03-30 LAB — CBC WITH DIFFERENTIAL/PLATELET
Basophils Absolute: 0.1 10*3/uL (ref 0.0–0.1)
Basophils Relative: 0.9 % (ref 0.0–3.0)
Eosinophils Absolute: 0.1 10*3/uL (ref 0.0–0.7)
Eosinophils Relative: 0.6 % (ref 0.0–5.0)
HCT: 44.1 % (ref 36.0–46.0)
Hemoglobin: 14.7 g/dL (ref 12.0–15.0)
Lymphocytes Relative: 24.6 % (ref 12.0–46.0)
Lymphs Abs: 3.2 10*3/uL (ref 0.7–4.0)
MCHC: 33.4 g/dL (ref 30.0–36.0)
MCV: 95.2 fl (ref 78.0–100.0)
Monocytes Absolute: 1.2 10*3/uL — ABNORMAL HIGH (ref 0.1–1.0)
Monocytes Relative: 9.7 % (ref 3.0–12.0)
Neutro Abs: 8.3 10*3/uL — ABNORMAL HIGH (ref 1.4–7.7)
Neutrophils Relative %: 64.2 % (ref 43.0–77.0)
Platelets: 267 10*3/uL (ref 150.0–400.0)
RBC: 4.63 Mil/uL (ref 3.87–5.11)
RDW: 14 % (ref 11.5–15.5)
WBC: 12.9 10*3/uL — ABNORMAL HIGH (ref 4.0–10.5)

## 2021-03-30 LAB — BASIC METABOLIC PANEL
BUN: 13 mg/dL (ref 6–23)
CO2: 28 mEq/L (ref 19–32)
Calcium: 10.3 mg/dL (ref 8.4–10.5)
Chloride: 96 mEq/L (ref 96–112)
Creatinine, Ser: 0.98 mg/dL (ref 0.40–1.20)
GFR: 60.87 mL/min (ref >60.00)
Glucose, Bld: 136 mg/dL — ABNORMAL HIGH (ref 70–99)
Potassium: 4.2 mEq/L (ref 3.5–5.1)
Sodium: 137 mEq/L (ref 135–145)

## 2021-03-31 ENCOUNTER — Other Ambulatory Visit: Payer: Self-pay

## 2021-03-31 ENCOUNTER — Encounter: Payer: Self-pay | Admitting: Family Medicine

## 2021-03-31 ENCOUNTER — Ambulatory Visit: Payer: Medicare HMO | Admitting: Family Medicine

## 2021-03-31 VITALS — BP 111/62 | HR 82 | Temp 98.2°F | Resp 16 | Ht 62.0 in | Wt 198.8 lb

## 2021-03-31 DIAGNOSIS — N12 Tubulo-interstitial nephritis, not specified as acute or chronic: Secondary | ICD-10-CM | POA: Diagnosis not present

## 2021-03-31 LAB — URINE CULTURE
MICRO NUMBER:: 11936524
Result:: NO GROWTH
SPECIMEN QUALITY:: ADEQUATE

## 2021-03-31 NOTE — Progress Notes (Signed)
OFFICE VISIT  03/31/2021  CC:  Chief Complaint  Patient presents with  . Follow-up    Pyelonephritis, she is feeling a little better, still having urinary pain but starting to subside.     HPI:    Patient is a 65 y.o. Caucasian female who presents for 2 d f/u UTI, suspected pyelonephritis. A/P as of last visit: "Pyelonephritis.  Relatively immunosuppressed d/t being on humira. She feels horrible but I don't think she is septic. She is taking PO well. She has appropriately skipped most recent dose of humira. Start cipro 500mg  bid x 7d. Hold lisinopril, fibrate, and statin until feeling back to normal. CBC and bmet today.  Send urine for c/s."  INTERIM HX: Doing a little better: no fever, less pain in L CVA area.  Urgency, frequency, and dysuria still there but improved some.  No gross blood.  No n/v/d.  Drinking well.  No new symptoms.  Reviewed lab results from last visit with pt today: Urine clx ended up showing NO GROWTH. CBC showed mild leukocytosis (12.9K, elev neutrophils) but o/w normal, CMET normal except mild elev AST, ALT (not new, started back in June 2021).   Past Medical History:  Diagnosis Date  . Aortic valve stenosis    "very mild"  . Atypical chest pain 2007; 2015   cardiolyte neg, echo nl, cath showed mild/nonobstructive LAD disease  . Bilateral primary osteoarthritis of knee 06/30/2012   Right >L.  Right unicompartmental knee arthroplasty 10/25/18  . CAD (coronary artery disease)   . Chronic LBP    Multiple surgeries  . COPD (chronic obstructive pulmonary disease) ([place])   . COVID-19 virus infection 06/02/2019   Eval at [facility] ED and d/c'd home.  Admitted 06/12/19 with hypoxic RF and bilat infiltrates.  . DDD (degenerative disc disease)    spinal stenosis  . Elevated liver enzymes    2021 after starting methotrexate. 2022->rheum d/c'd her methotrexate   Her abd u/s 12/05/20 showed fatty liver, o/w normal.  . Fatty liver    11/2020 liver u/s  . Gastritis     H pylori NEG on EGD 02/2021  . GERD (gastroesophageal reflux disease)   . History of GI bleed    NSAIDS  . Hyperlipidemia    mixed  . Microscopic hematuria    full w/ u unrevealing X 2  . Normal nuclear stress test 11/11 and 06/2014   negative, EF normal  . Palpitations 2006   48H holter neg  . Prediabetes    Highest A1c 6.1% as of 03/2017  . RBBB (right bundle branch block)   . Recurrent UTI   . Seronegative rheumatoid arthritis ([place]) 10/2019   multiple sites->pred started, methotrex started 10/2019; rheum to add humira as of 03/2020  . Tobacco dependence in remission    Quit fall 2015.      Past Surgical History:  Procedure Laterality Date  . ABDOMINAL HYSTERECTOMY  1997   DUB  . APPENDECTOMY  1984  . BIOPSY  02/14/2021   Procedure: BIOPSY;  Surgeon: Eloise Harman, DO;  [facility];  Service: Endoscopy;;  . CARDIAC CATHETERIZATION  10/09/2005   no CAD, mildly elevated LVEDP, normal LV function (Dr. Gerrie Nordmann)  . CARDIAC CATHETERIZATION N/A 05/16/2015   Mild non-obstructive CAD--med mgmt.  Procedure: Left Heart Cath and Coronary Angiography;  Surgeon: Pixie Casino, MD;  Location: [place] [facility];  Service: Cardiovascular;  Laterality: N/A;  . CARDIOVASCULAR STRESS TEST  06/2014   normal lexiscan  NST  . CARDIOVASCULAR STRESS TEST  2006   persantine - no ischemia, low risk   . ESOPHAGOGASTRODUODENOSCOPY  02/14/2021   Gastritis ( H pylori NEG) w/out bleeding  . ESOPHAGOGASTRODUODENOSCOPY (EGD) WITH PROPOFOL N/A 02/14/2021   Procedure: ESOPHAGOGASTRODUODENOSCOPY (EGD) WITH PROPOFOL;  Surgeon: Eloise Harman, DO;  [facility];  Service: Endoscopy;  Laterality: N/A;  AM  . KNEE ARTHROSCOPY Bilateral   . left wrist ganglion cyst excision  2010  . [place]   right iliac crest bone graft+metal instrumentation; 2005 metal removal and decompression, 2011 lumbar decompression 4-11, then stabilization/ instrumentation done 09-19-10:  L2,L3, L5 left and L2 , L3, L4 right pedical remnant L4 left embedded. Left iliac crest bone graft-- Dr Velna Ochs  . LUMBAR SPINE SURGERY  02/10/2016   Dr. Velna Ochs: lumbar decompression, instrumentation removal, and fusion exploration--HELPED her a lot, esp her radicular leg pains.  . OOPHORECTOMY Right    cyst  . PARTIAL KNEE ARTHROPLASTY Right 11/04/2018   Procedure: UNICOMPARTMENTAL KNEE;  Surgeon: Renette Butters, MD;  Location: [facility];  Service: Orthopedics;  Laterality: Right;  Adductor Block  . TONSILLECTOMY  65 yrs old  . TRANSTHORACIC ECHOCARDIOGRAM  2006   EF=>55%, trace MR, mild TR, trace AV regurg, trace pulm valve regurg     Outpatient Medications Prior to Visit  Medication Sig Dispense Refill  . Adalimumab (HUMIRA) 40 MG/0.8ML PSKT Inject 40 mg into the skin every 14 (fourteen) days.    Marland Kitchen albuterol (VENTOLIN HFA) 108 (90 Base) MCG/ACT inhaler Inhale 2 puffs into the lungs every 4 (four) hours as needed for wheezing or shortness of breath (bronchitis). Reported on 03/12/2016 18 g 1  . ciprofloxacin (CIPRO) 500 MG tablet Take 1 tablet (500 mg total) by mouth 2 (two) times daily for 7 days. 14 tablet 0  . clopidogrel (PLAVIX) 75 MG tablet TAKE 1 TABLET(75 MG) BY MOUTH DAILY (Patient taking differently: Take 75 mg by mouth daily.) 90 tablet 3  . cyclobenzaprine (FLEXERIL) 10 MG tablet TAKE 1 TABLET BY MOUTH EVERY 8 HOURS AS NEEDED 90 tablet 3  . folic acid (FOLVITE) 1 MG tablet Take 1 mg by mouth daily.     Marland Kitchen LORazepam (ATIVAN) 1 MG tablet Take 1 tablet (1 mg total) by mouth 2 (two) times daily. 60 tablet 5  . oxyCODONE (OXY IR/ROXICODONE) 5 MG immediate release tablet 1-2 po tid prn pain (Patient taking differently: Take 5-10 mg by mouth 3 (three) times daily as needed for severe pain. 1-2 po tid prn pain) 180 tablet 0  . pantoprazole (PROTONIX) 40 MG tablet Take 1 tablet (40 mg total) by mouth 2 (two) times daily. 60 tablet 5  . promethazine (PHENERGAN) 12.5 MG tablet 1-2 tabs po q6h prn  nausea (Patient taking differently: Take 12.5-25 mg by mouth every 6 (six) hours as needed for nausea or vomiting. 1-2 tabs po q6h prn nausea) 45 tablet 1  . fenofibrate 160 MG tablet TAKE 1 TABLET BY MOUTH EVERY DAY WITH FOOD (Patient not taking: Reported on 03/31/2021) 90 tablet 3  . lisinopril (ZESTRIL) 5 MG tablet TAKE 1 TABLET(5 MG) BY MOUTH DAILY (Patient not taking: Reported on 03/31/2021) 90 tablet 1  . methotrexate 2.5 MG tablet TAKE 6 TABLETS BY MOUTH 1 TIME WEEKLY (Patient not taking: No sig reported)    . nitroGLYCERIN (NITROSTAT) 0.4 MG SL tablet Place 1 tablet (0.4 mg total) under the tongue every 5 (five) minutes as needed for chest pain (MAX  3 doses.). (Patient not taking: No sig reported) 10 tablet 1  . rosuvastatin (CRESTOR) 40 MG tablet TAKE 1 TABLET(40 MG) BY MOUTH DAILY (Patient not taking: Reported on 03/31/2021) 90 tablet 3   No facility-administered medications prior to visit.    Allergies  Allergen Reactions  . Aspirin Other (See Comments)     GI Bleed  . Beta Adrenergic Blockers Other (See Comments)    REACTION: decreased libido  . Citalopram Nausea And Vomiting  . Nsaids Other (See Comments)     GI Upset  . Penicillins Swelling and Other (See Comments)    DID THE REACTION INVOLVE: Swelling of the face/tongue/throat, SOB, or low BP? Yes Sudden or severe rash/hives, skin peeling, or the inside of the mouth or nose? Yes Did it require medical treatment? Yes When did it last happen? 65 years old If all above answers are "NO", may proceed with cephalosporin use.   Marland Kitchen Prochlorperazine Edisylate Other (See Comments)     Stroke like symptoms  . Sulfonamide Derivatives Other (See Comments)    Unknown allergic reaction  . Amitriptyline Hcl Palpitations and Other (See Comments)     increased heart rate    ROS As per HPI  PE: Vitals with BMI 03/31/2021 03/29/2021 02/14/2021  Height 5\' 2"  5\' 2"  -  Weight 198 lbs 13 oz 197 lbs 6 oz -  BMI 23.30 07.6 -  Systolic 226 88  333  Diastolic 62 64 72  Pulse 82 108 -     Gen: Alert, well appearing.  Patient is oriented to person, place, time, and situation. AFFECT: pleasant, lucid thought and speech. CV: RRR, no m/r/g.   LUNGS: CTA bilat, nonlabored resps, good aeration in all lung fields. Still with quite significant percussion tenderness to L CVA region. ABD: soft, NT/ND EXT: no clubbing or cyanosis.  no edema.    LABS:    Chemistry      Component Value Date/Time   NA 137 03/29/2021 1448   NA 144 03/07/2021 0000   K 4.2 03/29/2021 1448   CL 96 03/29/2021 1448   CO2 28 03/29/2021 1448   BUN 13 03/29/2021 1448   BUN 9 03/07/2021 0000   CREATININE 0.98 03/29/2021 1448   CREATININE 0.69 06/29/2011 1607   GLU 111 03/07/2021 0000      Component Value Date/Time   CALCIUM 10.3 03/29/2021 1448   ALKPHOS 88 03/07/2021 0000   AST 94 (A) 03/07/2021 0000   ALT 69 (A) 03/07/2021 0000   BILITOT 1.3 (H) 02/14/2021 1007   BILITOT 0.7 01/05/2021 1119     Lab Results  Component Value Date   WBC 12.9 (H) 03/29/2021   HGB 14.7 03/29/2021   HCT 44.1 03/29/2021   MCV 95.2 03/29/2021   PLT 267.0 03/29/2021    IMPRESSION AND PLAN:  1) Pyelonephritis, improved some over the last 2d on abx. Clx was no growth but symptoms+exam convincing. Finish 5 more days of cipro and call if still some residual L cva discomfort at end of abx. Signs/symptoms to call or return for were reviewed and pt expressed understanding.  An After Visit Summary was printed and given to the patient.  FOLLOW UP: No follow-ups on file.  Signed:  Crissie Sickles, MD           03/31/2021

## 2021-04-19 ENCOUNTER — Other Ambulatory Visit: Payer: Self-pay

## 2021-04-19 ENCOUNTER — Encounter: Payer: Self-pay | Admitting: Family Medicine

## 2021-04-19 ENCOUNTER — Ambulatory Visit (INDEPENDENT_AMBULATORY_CARE_PROVIDER_SITE_OTHER): Payer: Medicare HMO | Admitting: Family Medicine

## 2021-04-19 VITALS — BP 131/79 | HR 86 | Temp 98.4°F | Resp 16 | Ht 62.75 in | Wt 202.8 lb

## 2021-04-19 DIAGNOSIS — G894 Chronic pain syndrome: Secondary | ICD-10-CM

## 2021-04-19 DIAGNOSIS — R7303 Prediabetes: Secondary | ICD-10-CM | POA: Diagnosis not present

## 2021-04-19 DIAGNOSIS — Z23 Encounter for immunization: Secondary | ICD-10-CM | POA: Diagnosis not present

## 2021-04-19 DIAGNOSIS — Z1211 Encounter for screening for malignant neoplasm of colon: Secondary | ICD-10-CM | POA: Diagnosis not present

## 2021-04-19 DIAGNOSIS — I10 Essential (primary) hypertension: Secondary | ICD-10-CM | POA: Diagnosis not present

## 2021-04-19 DIAGNOSIS — M25562 Pain in left knee: Secondary | ICD-10-CM

## 2021-04-19 DIAGNOSIS — R7401 Elevation of levels of liver transaminase levels: Secondary | ICD-10-CM

## 2021-04-19 DIAGNOSIS — Z0000 Encounter for general adult medical examination without abnormal findings: Secondary | ICD-10-CM | POA: Diagnosis not present

## 2021-04-19 DIAGNOSIS — Z79899 Other long term (current) drug therapy: Secondary | ICD-10-CM | POA: Diagnosis not present

## 2021-04-19 DIAGNOSIS — E7800 Pure hypercholesterolemia, unspecified: Secondary | ICD-10-CM

## 2021-04-19 DIAGNOSIS — G8929 Other chronic pain: Secondary | ICD-10-CM

## 2021-04-19 DIAGNOSIS — M25561 Pain in right knee: Secondary | ICD-10-CM

## 2021-04-19 DIAGNOSIS — M545 Low back pain, unspecified: Secondary | ICD-10-CM

## 2021-04-19 MED ORDER — OXYCODONE HCL 5 MG PO TABS: ORAL_TABLET | ORAL | 0 refills | Status: DC

## 2021-04-19 MED ORDER — ZOSTER VAC RECOMB ADJUVANTED 50 MCG/0.5ML IM SUSR: 0.5000 mL | Freq: Once | INTRAMUSCULAR | 0 refills | Status: AC

## 2021-04-19 NOTE — Progress Notes (Signed)
Office Note 04/19/2021  CC:  Chief Complaint  Patient presents with   Annual Exam    Pt is fasting    HPI:  Brenda [name] is a 65 y.o. White female who is here for annual health maintenance exam and 3 mo f/u for chronic pain syndrome, HTN, HLD, GAD, and prediabetes.  HTN: home bp's consistently <130/80. Still off lisinopril that we had stopped during recent pyelo illness.  HLD: fenofibrate and rosuva qd except lately has been holding b/c recent pyelo illness.  Prediab: occ home glucose->135 or so fasting, 2H PP <150.  She has pretty chronic R UQ pain lately---got endoscopy and pretty unremarkable, [place] abd showed only fatty liver.  PPI bid has not made any diff in her RUQ pain but GERD is better.  Has f/u with GI set up about 1st week in July. No n/v.  Chronic pain syndrome: Indication for chronic opioid: chronic bilat LBP w/out sciatica, and chronic pain from bilat osteoarthritis knees.   Medication and dose: oxycodone 5mg , 1-2 tid prn. # pills per month: #180 PMP AWARE reviewed today: most recent rx for oxycodone was filled 03/26/21, # 180 , rx by me. No red flags. Pain control is adequate, located low back and both knees.   For her seroneg RA she is still OFF methotrexate.  Pt still on humira, most recent inj about 10d ago.  Has been on lorazepam 1mg  bid for good longterm control of her anxiety, no adverse effects. Most recent rx for lorazepam filled 03/29/21.  Past Medical History:  Diagnosis Date   Aortic valve stenosis    "very mild"   Atypical chest pain 2007; 2015   cardiolyte neg, echo nl, cath showed mild/nonobstructive LAD disease   Bilateral primary osteoarthritis of knee 06/30/2012   Right >L.  Right unicompartmental knee arthroplasty 10/25/18   CAD (coronary artery disease)    Chronic LBP    Multiple surgeries   COPD (chronic obstructive pulmonary disease) ([place])    COVID-19 virus infection 06/02/2019   Eval at [facility] ED and d/c'd home.  Admitted 06/12/19  with hypoxic RF and bilat infiltrates.   DDD (degenerative disc disease)    spinal stenosis   Elevated liver enzymes    2021 after starting methotrexate. 2022->rheum d/c'd her methotrexate   Her abd u/s 12/05/20 showed fatty liver, o/w normal.   Fatty liver    11/2020 liver u/s   Gastritis    H pylori NEG on EGD 02/2021   GERD (gastroesophageal reflux disease)    History of GI bleed    NSAIDS   Hyperlipidemia    mixed   Microscopic hematuria    full w/ u unrevealing X 2   Normal nuclear stress test 11/11 and 06/2014   negative, EF normal   Palpitations 2006   48H holter neg   Prediabetes    Highest A1c 6.1% as of 03/2017   RBBB (right bundle branch block)    Recurrent UTI    Seronegative rheumatoid arthritis ([place]) 10/2019   multiple sites->pred started, methotrex started 10/2019; rheum to add humira as of 03/2020   Tobacco dependence in remission    Quit fall 2015.      Past Surgical History:  Procedure Laterality Date   ABDOMINAL HYSTERECTOMY  1997   DUB   APPENDECTOMY  1984   BIOPSY  02/14/2021   Procedure: BIOPSY;  Surgeon: Eloise Harman, DO;  Location: [facility];  Service: Endoscopy;;   CARDIAC CATHETERIZATION  10/09/2005  no CAD, mildly elevated LVEDP, normal LV function (Dr. Gerrie Nordmann)   CARDIAC CATHETERIZATION N/A 05/16/2015   Mild non-obstructive CAD--med mgmt.  Procedure: Left Heart Cath and Coronary Angiography;  Surgeon: Pixie Casino, MD;  Location: [facility];  Service: Cardiovascular;  Laterality: N/A;   CARDIOVASCULAR STRESS TEST  06/2014   normal lexiscan NST   CARDIOVASCULAR STRESS TEST  2006   persantine - no ischemia, low risk    ESOPHAGOGASTRODUODENOSCOPY  02/14/2021   Gastritis ( H pylori NEG) w/out bleeding   ESOPHAGOGASTRODUODENOSCOPY (EGD) WITH PROPOFOL N/A 02/14/2021   Procedure: ESOPHAGOGASTRODUODENOSCOPY (EGD) WITH PROPOFOL;  Surgeon: Eloise Harman, DO;  Location: [facility];  Service: Endoscopy;  Laterality: N/A;  AM   KNEE  ARTHROSCOPY Bilateral    left wrist ganglion cyst excision  2010   [place]   right iliac crest bone graft+metal instrumentation; 2005 metal removal and decompression, 2011 lumbar decompression 4-11, then stabilization/ instrumentation done 09-19-10: L2,L3, L5 left and L2 , L3, L4 right pedical remnant L4 left embedded. Left iliac crest bone graft-- Dr Zollie Pee SPINE SURGERY  02/10/2016   Dr. Velna Ochs: lumbar decompression, instrumentation removal, and fusion exploration--HELPED her a lot, esp her radicular leg pains.   OOPHORECTOMY Right    cyst   PARTIAL KNEE ARTHROPLASTY Right 11/04/2018   Procedure: UNICOMPARTMENTAL KNEE;  Surgeon: Renette Butters, MD;  Location: [facility];  Service: Orthopedics;  Laterality: Right;  Adductor Block   TONSILLECTOMY  65 yrs old   TRANSTHORACIC ECHOCARDIOGRAM  2006   EF=>55%, trace MR, mild TR, trace AV regurg, trace pulm valve regurg     Family History  Problem Relation Age of Onset   Heart Problems Mother        and thyroid problems   Hyperlipidemia Mother    Diabetes Mother    Breast cancer Mother    Heart failure Father        CHF, heart attack, diabetes, hyperlipidemia   Heart disease Brother        back problems   Hypertension Brother    Kidney failure Maternal Grandmother    Stroke Maternal Grandfather    Cancer Paternal Grandmother        mouth - snuff   Heart attack Paternal Grandfather        stroke, HTN   Hyperlipidemia Brother    Heart attack Brother    Cancer Brother    Coronary artery disease Neg Hx     Social History   Socioeconomic History   Marital status: Married    Spouse name: Not on file   Number of children: Not on file   Years of education: Not on file   Highest education level: Not on file  Occupational History   Not on file  Tobacco Use   Smoking status: Former    Packs/day: 1.00    Years: 35.00    Pack years: 35.00    Types: Cigarettes    Quit date: 07/22/2014    Years since  quitting: 6.7   Smokeless tobacco: Never   Tobacco comments:    down to ~2 cigarettes/daily (06/23/14) - Quit Smoking around 07/20/14!  Vaping Use   Vaping Use: Never used  Substance and Sexual Activity   Alcohol use: No    Alcohol/week: 0.0 standard drinks   Drug use: No   Sexual activity: Yes    Partners: Male    Birth control/protection: Surgical  Other Topics Concern  Not on file  Social History Narrative   Not on file   Social Determinants of Health   Financial Resource Strain: Low Risk    Difficulty of Paying Living Expenses: Not hard at all  Food Insecurity: No Food Insecurity   Worried About Charity fundraiser in the Last Year: Never true   Arboriculturist in the Last Year: Never true  Transportation Needs: No Transportation Needs   Lack of Transportation (Medical): No   Lack of Transportation (Non-Medical): No  Physical Activity: Inactive   Days of Exercise per Week: 0 days   Minutes of Exercise per Session: 0 min  Stress: No Stress Concern Present   Feeling of Stress : Not at all  Social Connections: Moderately Integrated   Frequency of Communication with Friends and Family: More than three times a week   Frequency of Social Gatherings with Friends and Family: More than three times a week   Attends Religious Services: 1 to 4 times per year   Active Member of [employer] or Organizations: No   Attends Archivist Meetings: Never   Marital Status: Married  Human resources officer Violence: Not At Risk   Fear of Current or Ex-Partner: No   Emotionally Abused: No   Physically Abused: No   Sexually Abused: No    Outpatient Medications Prior to Visit  Medication Sig Dispense Refill   Adalimumab (HUMIRA) 40 MG/0.8ML PSKT Inject 40 mg into the skin every 14 (fourteen) days.     clopidogrel (PLAVIX) 75 MG tablet TAKE 1 TABLET(75 MG) BY MOUTH DAILY (Patient taking differently: Take 75 mg by mouth daily.) 90 tablet 3   cyclobenzaprine (FLEXERIL) 10 MG tablet TAKE 1  TABLET BY MOUTH EVERY 8 HOURS AS NEEDED 90 tablet 3   folic acid (FOLVITE) 1 MG tablet Take 1 mg by mouth daily.      LORazepam (ATIVAN) 1 MG tablet Take 1 tablet (1 mg total) by mouth 2 (two) times daily. 60 tablet 5   pantoprazole (PROTONIX) 40 MG tablet Take 1 tablet (40 mg total) by mouth 2 (two) times daily. 60 tablet 5   promethazine (PHENERGAN) 12.5 MG tablet 1-2 tabs po q6h prn nausea (Patient taking differently: Take 12.5-25 mg by mouth every 6 (six) hours as needed for nausea or vomiting. 1-2 tabs po q6h prn nausea) 45 tablet 1   oxyCODONE (OXY IR/ROXICODONE) 5 MG immediate release tablet 1-2 po tid prn pain (Patient taking differently: Take 5-10 mg by mouth 3 (three) times daily as needed for severe pain. 1-2 po tid prn pain) 180 tablet 0   albuterol (VENTOLIN HFA) 108 (90 Base) MCG/ACT inhaler Inhale 2 puffs into the lungs every 4 (four) hours as needed for wheezing or shortness of breath (bronchitis). Reported on 03/12/2016 (Patient not taking: Reported on 04/19/2021) 18 g 1   fenofibrate 160 MG tablet TAKE 1 TABLET BY MOUTH EVERY DAY WITH FOOD (Patient not taking: No sig reported) 90 tablet 3   lisinopril (ZESTRIL) 5 MG tablet TAKE 1 TABLET(5 MG) BY MOUTH DAILY (Patient not taking: No sig reported) 90 tablet 1   nitroGLYCERIN (NITROSTAT) 0.4 MG SL tablet Place 1 tablet (0.4 mg total) under the tongue every 5 (five) minutes as needed for chest pain (MAX 3 doses.). (Patient not taking: No sig reported) 10 tablet 1   rosuvastatin (CRESTOR) 40 MG tablet TAKE 1 TABLET(40 MG) BY MOUTH DAILY (Patient not taking: No sig reported) 90 tablet 3   methotrexate 2.5 MG tablet  TAKE 6 TABLETS BY MOUTH 1 TIME WEEKLY (Patient not taking: No sig reported)     No facility-administered medications prior to visit.    Allergies  Allergen Reactions   Aspirin Other (See Comments)     GI Bleed   Beta Adrenergic Blockers Other (See Comments)    REACTION: decreased libido   Citalopram Nausea And Vomiting    Nsaids Other (See Comments)     GI Upset   Penicillins Swelling and Other (See Comments)    DID THE REACTION INVOLVE: Swelling of the face/tongue/throat, SOB, or low BP? Yes Sudden or severe rash/hives, skin peeling, or the inside of the mouth or nose? Yes Did it require medical treatment? Yes When did it last happen? 65 years old If all above answers are "NO", may proceed with cephalosporin use.    Prochlorperazine Edisylate Other (See Comments)     Stroke like symptoms   Sulfonamide Derivatives Other (See Comments)    Unknown allergic reaction   Amitriptyline Hcl Palpitations and Other (See Comments)     increased heart rate    ROS Review of Systems  Constitutional:  Negative for appetite change, chills, fatigue and fever.  HENT:  Negative for congestion, dental problem, ear pain and sore throat.   Eyes:  Negative for discharge, redness and visual disturbance.  Respiratory:  Negative for cough, chest tightness, shortness of breath and wheezing.   Cardiovascular:  Negative for chest pain, palpitations and leg swelling.  Gastrointestinal:  Positive for abdominal pain (chronic RUQ--see hpi). Negative for blood in stool, diarrhea, nausea and vomiting.  Genitourinary:  Negative for difficulty urinating, dysuria, flank pain, frequency, hematuria and urgency.  Musculoskeletal:  Positive for arthralgias (chronic many joints) and back pain (chronic LBP). Negative for joint swelling, myalgias and neck stiffness.  Skin:  Negative for pallor and rash.  Neurological:  Negative for dizziness, speech difficulty, weakness and headaches.  Hematological:  Negative for adenopathy. Does not bruise/bleed easily.  Psychiatric/Behavioral:  Negative for confusion and sleep disturbance. The patient is not nervous/anxious.    PE; Vitals with BMI 04/19/2021 03/31/2021 03/29/2021  Height 5' 2.75" 5\' 2"  5\' 2"   Weight 202 lbs 13 oz 198 lbs 13 oz 197 lbs 6 oz  BMI 36.2 51.70 01.7  Systolic 494 496 88  Diastolic  79 62 64  Pulse 86 82 108  O2 sat on RA today is 94%  Exam chaperoned by Deveron Furlong, CMA. Gen: Alert, well appearing.  Patient is oriented to person, place, time, and situation. AFFECT: pleasant, lucid thought and speech. ENT: Ears: EACs clear, normal epithelium.  TMs with good light reflex and landmarks bilaterally.  Eyes: no injection, icteris, swelling, or exudate.  EOMI, PERRLA. Nose: no drainage or turbinate edema/swelling.  No injection or focal lesion.  Mouth: lips without lesion/swelling.  Oral mucosa pink and moist.  Dentition intact and without obvious caries or gingival swelling.  Oropharynx without erythema, exudate, or swelling.  Neck: supple/nontender.  No LAD, mass, or TM.  Carotid pulses 2+ bilaterally, without bruits. CV: RRR, no m/r/g.   LUNGS: CTA bilat, nonlabored resps, good aeration in all lung fields. ABD: soft, signif RUQ TTP w/out guarding or rebound, ND, BS normal.  No hepatospenomegaly or mass.  No bruits.  No CVA tenderness. EXT: no clubbing, cyanosis, or edema.  Musculoskeletal: no joint swelling, erythema, warmth, or tenderness.  ROM of all joints intact. Skin - no sores or suspicious lesions or rashes or color changes  Pertinent labs:  Lab Results  Component Value Date   TSH 0.42 09/24/2019   Lab Results  Component Value Date   WBC 12.9 (H) 03/29/2021   HGB 14.7 03/29/2021   HCT 44.1 03/29/2021   MCV 95.2 03/29/2021   PLT 267.0 03/29/2021   Lab Results  Component Value Date   CREATININE 0.98 03/29/2021   BUN 13 03/29/2021   NA 137 03/29/2021   K 4.2 03/29/2021   CL 96 03/29/2021   CO2 28 03/29/2021   Lab Results  Component Value Date   ALT 69 (A) 03/07/2021   AST 94 (A) 03/07/2021   ALKPHOS 88 03/07/2021   BILITOT 1.3 (H) 02/14/2021   Lab Results  Component Value Date   CHOL 99 10/17/2020   Lab Results  Component Value Date   HDL 33.10 (L) 10/17/2020   Lab Results  Component Value Date   LDLCALC 33 10/17/2020   Lab Results   Component Value Date   TRIG 163.0 (H) 10/17/2020   Lab Results  Component Value Date   CHOLHDL 3 10/17/2020   Lab Results  Component Value Date   HGBA1C 5.8 (A) 01/16/2021   HGBA1C 5.8 01/16/2021   HGBA1C 5.8 01/16/2021   HGBA1C 5.8 01/16/2021   ASSESSMENT AND PLAN:   1) Chronic pain syndrome. Stable, cont oxycodone 5mg , 1-2 tid prn, #180. I did electronic rx's today for each of the next 3 months.  Appropriate fill on/after date was noted on each rx. CSC UTD. UDS next f/u visit.  2) HTN: bp pretty near normal but now that she is over her acute illness we'll restart lisinopril 5mg  qd. BMET today.  3) HLD, mixed: has been off statin and fibrate for a few weeks d/t acute illness/dehyd. Her transaminases have been up and this was presumably d/t being on methotrexate.  She has been off methotrexate for a while now and we'll recheck hepatic panel today and consider restart statin and fibrate. FLP today.  4) Prediabetes: occ home bp check is pretty good. Diet is fair.  Activity level is low d/t chronic pain.  5) GAD: doing well long term on lorazepam 1mg  bid.  New rx not needed today. CSC UTD. UDS next visit.  6) Recent Pyelonephritis (LEFT)--resolved.  7) Chronic RUQ pain: w/u thus far has shown gastritis (h pylori NEG).  Has GI f/u for about 3 wks from now. Cont PPI bid for now.  8) Health maintenance exam: Reviewed age and gender appropriate health maintenance issues (prudent diet, regular exercise, health risks of tobacco and excessive alcohol, use of seatbelts, fire alarms in home, use of sunscreen).  Also reviewed age and gender appropriate health screening as well as vaccine recommendations. Vaccines: Prevnar 20 given today.  Shingrix discussed->#1 given today. Labs: cbc w/diff, cmet, lipid panel, Hba1c (prediab). Cervical ca screening: n/a->remote hx of hysterectomy for benign dx. Breast ca screening: no record of any mammograms in our EMR-->she wants to think about  it. Colon ca screening: has never had colonoscopy.  iFOB neg 06/2017->iFOB ordered today.  An After Visit Summary was printed and given to the patient.  FOLLOW UP:  Return in about 3 months (around 07/20/2021) for routine chronic illness f/u.  Signed:  Crissie Sickles, MD           04/19/2021

## 2021-04-19 NOTE — Patient Instructions (Signed)
Health Maintenance, Female Adopting a healthy lifestyle and getting preventive care are important in promoting health and wellness. Ask your health care provider about: The right schedule for you to have regular tests and exams. Things you can do on your own to prevent diseases and keep yourself healthy. What should I know about diet, weight, and exercise? Eat a healthy diet  Eat a diet that includes plenty of vegetables, fruits, low-fat dairy products, and lean protein. Do not eat a lot of foods that are high in solid fats, added sugars, or sodium.  Maintain a healthy weight Body mass index (BMI) is used to identify weight problems. It estimates body fat based on height and weight. Your health care provider can help determineyour BMI and help you achieve or maintain a healthy weight. Get regular exercise Get regular exercise. This is one of the most important things you can do for your health. Most adults should: Exercise for at least 150 minutes each week. The exercise should increase your heart rate and make you sweat (moderate-intensity exercise). Do strengthening exercises at least twice a week. This is in addition to the moderate-intensity exercise. Spend less time sitting. Even light physical activity can be beneficial. Watch cholesterol and blood lipids Have your blood tested for lipids and cholesterol at 65 years of age, then havethis test every 5 years. Have your cholesterol levels checked more often if: Your lipid or cholesterol levels are high. You are older than 65 years of age. You are at high risk for heart disease. What should I know about cancer screening? Depending on your health history and family history, you may need to have cancer screening at various ages. This may include screening for: Breast cancer. Cervical cancer. Colorectal cancer. Skin cancer. Lung cancer. What should I know about heart disease, diabetes, and high blood pressure? Blood pressure and heart  disease High blood pressure causes heart disease and increases the risk of stroke. This is more likely to develop in people who have high blood pressure readings, are of African descent, or are overweight. Have your blood pressure checked: Every 3-5 years if you are 18-39 years of age. Every year if you are 40 years old or older. Diabetes Have regular diabetes screenings. This checks your fasting blood sugar level. Have the screening done: Once every three years after age 40 if you are at a normal weight and have a low risk for diabetes. More often and at a younger age if you are overweight or have a high risk for diabetes. What should I know about preventing infection? Hepatitis B If you have a higher risk for hepatitis B, you should be screened for this virus. Talk with your health care provider to find out if you are at risk forhepatitis B infection. Hepatitis C Testing is recommended for: Everyone born from 1945 through 1965. Anyone with known risk factors for hepatitis C. Sexually transmitted infections (STIs) Get screened for STIs, including gonorrhea and chlamydia, if: You are sexually active and are younger than 65 years of age. You are older than 65 years of age and your health care provider tells you that you are at risk for this type of infection. Your sexual activity has changed since you were last screened, and you are at increased risk for chlamydia or gonorrhea. Ask your health care provider if you are at risk. Ask your health care provider about whether you are at high risk for HIV. Your health care provider may recommend a prescription medicine to help   prevent HIV infection. If you choose to take medicine to prevent HIV, you should first get tested for HIV. You should then be tested every 3 months for as long as you are taking the medicine. Pregnancy If you are about to stop having your period (premenopausal) and you may become pregnant, seek counseling before you get  pregnant. Take 400 to 800 micrograms (mcg) of folic acid every day if you become pregnant. Ask for birth control (contraception) if you want to prevent pregnancy. Osteoporosis and menopause Osteoporosis is a disease in which the bones lose minerals and strength with aging. This can result in bone fractures. If you are 65 years old or older, or if you are at risk for osteoporosis and fractures, ask your health care provider if you should: Be screened for bone loss. Take a calcium or vitamin D supplement to lower your risk of fractures. Be given hormone replacement therapy (HRT) to treat symptoms of menopause. Follow these instructions at home: Lifestyle Do not use any products that contain nicotine or tobacco, such as cigarettes, e-cigarettes, and chewing tobacco. If you need help quitting, ask your health care provider. Do not use street drugs. Do not share needles. Ask your health care provider for help if you need support or information about quitting drugs. Alcohol use Do not drink alcohol if: Your health care provider tells you not to drink. You are pregnant, may be pregnant, or are planning to become pregnant. If you drink alcohol: Limit how much you use to 0-1 drink a day. Limit intake if you are breastfeeding. Be aware of how much alcohol is in your drink. In the [place] one drink equals one 12 oz bottle of beer (355 mL), one 5 oz glass of wine (148 mL), or one 1 oz glass of hard liquor (44 mL). General instructions Schedule regular health, dental, and eye exams. Stay current with your vaccines. Tell your health care provider if: You often feel depressed. You have ever been abused or do not feel safe at home. Summary Adopting a healthy lifestyle and getting preventive care are important in promoting health and wellness. Follow your health care provider's instructions about healthy diet, exercising, and getting tested or screened for diseases. Follow your health care provider's  instructions on monitoring your cholesterol and blood pressure. This information is not intended to replace advice given to you by your health care provider. Make sure you discuss any questions you have with your healthcare provider. Document Revised: 10/15/2018 Document Reviewed: 10/15/2018 Elsevier Patient Education  2022 Elsevier Inc.  

## 2021-04-20 LAB — COMPREHENSIVE METABOLIC PANEL
ALT: 47 U/L — ABNORMAL HIGH (ref 0–35)
AST: 48 U/L — ABNORMAL HIGH (ref 0–37)
Albumin: 4.3 g/dL (ref 3.5–5.2)
Alkaline Phosphatase: 71 U/L (ref 39–117)
BUN: 12 mg/dL (ref 6–23)
CO2: 30 mEq/L (ref 19–32)
Calcium: 10 mg/dL (ref 8.4–10.5)
Chloride: 103 mEq/L (ref 96–112)
Creatinine, Ser: 0.62 mg/dL (ref 0.40–1.20)
GFR: 93.82 mL/min (ref >60.00)
Glucose, Bld: 88 mg/dL (ref 70–99)
Potassium: 3.8 mEq/L (ref 3.5–5.1)
Sodium: 143 mEq/L (ref 135–145)
Total Bilirubin: 1.3 mg/dL — ABNORMAL HIGH (ref 0.2–1.2)
Total Protein: 7.1 g/dL (ref 6.0–8.3)

## 2021-04-20 LAB — CBC WITH DIFFERENTIAL/PLATELET
Basophils Absolute: 0.1 10*3/uL (ref 0.0–0.1)
Basophils Relative: 0.9 % (ref 0.0–3.0)
Eosinophils Absolute: 0.2 10*3/uL (ref 0.0–0.7)
Eosinophils Relative: 2.1 % (ref 0.0–5.0)
HCT: 43.2 % (ref 36.0–46.0)
Hemoglobin: 14.5 g/dL (ref 12.0–15.0)
Lymphocytes Relative: 41.7 % (ref 12.0–46.0)
Lymphs Abs: 4.4 10*3/uL — ABNORMAL HIGH (ref 0.7–4.0)
MCHC: 33.6 g/dL (ref 30.0–36.0)
MCV: 95.3 fl (ref 78.0–100.0)
Monocytes Absolute: 0.7 10*3/uL (ref 0.1–1.0)
Monocytes Relative: 7.1 % (ref 3.0–12.0)
Neutro Abs: 5.1 10*3/uL (ref 1.4–7.7)
Neutrophils Relative %: 48.2 % (ref 43.0–77.0)
Platelets: 154 10*3/uL (ref 150.0–400.0)
RBC: 4.53 Mil/uL (ref 3.87–5.11)
RDW: 14.4 % (ref 11.5–15.5)
WBC: 10.5 10*3/uL (ref 4.0–10.5)

## 2021-04-20 LAB — LIPID PANEL
Cholesterol: 212 mg/dL — ABNORMAL HIGH (ref 0–200)
HDL: 57.3 mg/dL (ref >39.00)
LDL Cholesterol: 132 mg/dL — ABNORMAL HIGH (ref 0–99)
NonHDL: 154.45
Total CHOL/HDL Ratio: 4
Triglycerides: 113 mg/dL (ref 0.0–149.0)
VLDL: 22.6 mg/dL (ref 0.0–40.0)

## 2021-04-20 LAB — HEMOGLOBIN A1C: Hgb A1c MFr Bld: 5.8 % (ref 4.6–6.5)

## 2021-04-21 ENCOUNTER — Other Ambulatory Visit: Payer: Self-pay | Admitting: Family Medicine

## 2021-04-27 ENCOUNTER — Telehealth: Payer: Self-pay

## 2021-04-27 NOTE — Telephone Encounter (Signed)
Notified pt that she will have to pick up new specimen type. Will have in front office for pt pick up

## 2021-04-27 NOTE — Telephone Encounter (Signed)
Yesterday, pt came and dropped off the 3 slide hemoccult cards. I noticed in the chart that she is supposed to have the fecal occult test done. Would you like to use the hemoccult cards or would you like for [place] to have pt come pick up the ifobt, as I was not in the lab on the day she had her appt.

## 2021-04-27 NOTE — Telephone Encounter (Signed)
Tell pt sorry but she does need the iFOB done.-thx

## 2021-05-02 NOTE — Addendum Note (Signed)
Addended by: Octaviano Glow on: 05/02/2021 01:17 PM   Modules accepted: Orders

## 2021-05-03 ENCOUNTER — Encounter: Payer: Self-pay | Admitting: Family Medicine

## 2021-05-03 ENCOUNTER — Other Ambulatory Visit: Payer: Medicare HMO

## 2021-05-03 ENCOUNTER — Other Ambulatory Visit: Payer: Self-pay

## 2021-05-03 DIAGNOSIS — R195 Other fecal abnormalities: Secondary | ICD-10-CM

## 2021-05-03 LAB — FECAL OCCULT BLOOD, IMMUNOCHEMICAL: Fecal Occult Bld: POSITIVE — AB

## 2021-05-10 ENCOUNTER — Ambulatory Visit: Payer: Medicare HMO | Admitting: Internal Medicine

## 2021-05-10 ENCOUNTER — Encounter: Payer: Self-pay | Admitting: Internal Medicine

## 2021-05-10 ENCOUNTER — Other Ambulatory Visit: Payer: Self-pay

## 2021-05-10 VITALS — BP 134/84 | HR 87 | Temp 97.7°F | Ht 63.0 in | Wt 203.6 lb

## 2021-05-10 DIAGNOSIS — R1011 Right upper quadrant pain: Secondary | ICD-10-CM

## 2021-05-10 DIAGNOSIS — G8929 Other chronic pain: Secondary | ICD-10-CM | POA: Diagnosis not present

## 2021-05-10 DIAGNOSIS — R195 Other fecal abnormalities: Secondary | ICD-10-CM

## 2021-05-10 DIAGNOSIS — K219 Gastro-esophageal reflux disease without esophagitis: Secondary | ICD-10-CM | POA: Diagnosis not present

## 2021-05-10 NOTE — Patient Instructions (Addendum)
We will schedule you for colonoscopy to further evaluate your positive stool test for blood as well as your abdominal pain.  Further recommendations to follow.  Continue on pantoprazole.  You can decrease this down to once daily and see how you do.  If your symptoms are not well controlled then you can go back up to twice daily.  At [facility] we value your feedback. You may receive a survey about your visit today. Please share your experience as we strive to create trusting relationships with our patients to provide genuine, compassionate, quality care.  We appreciate your understanding and patience as we review any laboratory studies, imaging, and other diagnostic tests that are ordered as we care for you. Our office policy is 5 business days for review of these results, and any emergent or urgent results are addressed in a timely manner for your best interest. If you do not hear from our office in 1 week, please contact us.   We also encourage the use of MyChart, which contains your medical information for your review as well. If you are not enrolled in this feature, an access code is on this after visit summary for your convenience. Thank you for allowing [place] to be involved in your care.  It was great to see you today!  I hope you have a great rest of your summer!!    Brenda [name]. Abbey Chatters, D.O. Gastroenterology and Hepatology [facility]

## 2021-05-10 NOTE — Progress Notes (Signed)
  Referring Provider: McGowen, Philip H, MD Primary Care Physician:  McGowen, Philip H, MD Primary GI:  Dr. Carver  Chief Complaint  Patient presents with   positive hemoccult    Never had tcs   Abdominal Pain    Right abd, comes and goes. Sometimes causes nausea    HPI:   Brenda [name] is a 64 y.o. female who presents to clinic today for follow-up visit.  Initially seen for abnormal LFTs initially elevated aminotransferases with AST 117, ALT 48, alk phos and T bili WNL.  She underwent full serological work-up which is unremarkable.  Her methotrexate for her rheumatoid has been on hold and her LFTs are improved.  Most recent AST 48 ALT 47.  She states she has not noticed a difference in her rheumatoid since being off of this medication.  Does have chronic abdominal pain.  Underwent EGD 02/14/2021 which showed H. pylori negative gastritis, otherwise unremarkable.  Currently taking pantoprazole 40 mg twice daily states her reflux is controlled.  Continues to have abdominal pain which is right upper and right lower quadrant pain.  No previous colonoscopy.  Recently Hemoccult positive testing done by her PCP.  Past Medical History:  Diagnosis Date   Aortic valve stenosis    "very mild"   Atypical chest pain 2007; 2015   cardiolyte neg, echo nl, cath showed mild/nonobstructive LAD disease   Bilateral primary osteoarthritis of knee 06/30/2012   Right >L.  Right unicompartmental knee arthroplasty 10/25/18   CAD (coronary artery disease)    Chronic LBP    Multiple surgeries   COPD (chronic obstructive pulmonary disease) (HCC)    COVID-19 virus infection 06/02/2019   Eval at [facility] ED and d/c'd home.  Admitted 06/12/19 with hypoxic RF and bilat infiltrates.   DDD (degenerative disc disease)    spinal stenosis   Elevated liver enzymes    2021 after starting methotrexate. 2022->rheum d/c'd her methotrexate   Her abd u/s 12/05/20 showed fatty liver, o/w normal.   Fatty liver    11/2020 liver  u/s   Gastritis    H pylori NEG on EGD 02/2021   GERD (gastroesophageal reflux disease)    History of GI bleed    NSAIDS   Hyperlipidemia    mixed   Microscopic hematuria    full w/ u unrevealing X 2   Normal nuclear stress test 11/11 and 06/2014   negative, EF normal   Palpitations 2006   48H holter neg   Positive occult stool blood test    iFOB positive 04/2021->ref to GI   Prediabetes    Highest A1c 6.1% as of 03/2017   RBBB (right bundle branch block)    Recurrent UTI    Seronegative rheumatoid arthritis (HCC) 10/2019   multiple sites->pred started, methotrex started 10/2019; rheum to add humira as of 03/2020   Tobacco dependence in remission    Quit fall 2015.      Past Surgical History:  Procedure Laterality Date   ABDOMINAL HYSTERECTOMY  1997   DUB   APPENDECTOMY  1984   BIOPSY  02/14/2021   Procedure: BIOPSY;  Surgeon: Carver, Charles K, DO;  Location: [facility];  Service: Endoscopy;;   CARDIAC CATHETERIZATION  10/09/2005   no CAD, mildly elevated LVEDP, normal LV function (Dr. R. McQueen)   CARDIAC CATHETERIZATION N/A 05/16/2015   Mild non-obstructive CAD--med mgmt.  Procedure: Left Heart Cath and Coronary Angiography;  Surgeon: Kenneth C Hilty, MD;  Location: [facility];    Service: Cardiovascular;  Laterality: N/A;   CARDIOVASCULAR STRESS TEST  06/2014   normal lexiscan NST   CARDIOVASCULAR STRESS TEST  2006   persantine - no ischemia, low risk    ESOPHAGOGASTRODUODENOSCOPY  02/14/2021   Gastritis ( H pylori NEG) w/out bleeding   ESOPHAGOGASTRODUODENOSCOPY (EGD) WITH PROPOFOL N/A 02/14/2021   Procedure: ESOPHAGOGASTRODUODENOSCOPY (EGD) WITH PROPOFOL;  Surgeon: Eloise Harman, DO;  Location: [facility];  Service: Endoscopy;  Laterality: N/A;  AM   KNEE ARTHROSCOPY Bilateral    left wrist ganglion cyst excision  2010   [place]   right iliac crest bone graft+metal instrumentation; 2005 metal removal and decompression, 2011 lumbar  decompression 4-11, then stabilization/ instrumentation done 09-19-10: L2,L3, L5 left and L2 , L3, L4 right pedical remnant L4 left embedded. Left iliac crest bone graft-- Dr Zollie Pee SPINE SURGERY  02/10/2016   Dr. Velna Ochs: lumbar decompression, instrumentation removal, and fusion exploration--HELPED her a lot, esp her radicular leg pains.   OOPHORECTOMY Right    cyst   PARTIAL KNEE ARTHROPLASTY Right 11/04/2018   Procedure: UNICOMPARTMENTAL KNEE;  Surgeon: Renette Butters, MD;  Location: [facility];  Service: Orthopedics;  Laterality: Right;  Adductor Block   TONSILLECTOMY  65 yrs old   TRANSTHORACIC ECHOCARDIOGRAM  2006   EF=>55%, trace MR, mild TR, trace AV regurg, trace pulm valve regurg     Current Outpatient Medications  Medication Sig Dispense Refill   Adalimumab (HUMIRA) 40 MG/0.8ML PSKT Inject 40 mg into the skin every 14 (fourteen) days.     albuterol (VENTOLIN HFA) 108 (90 Base) MCG/ACT inhaler Inhale 2 puffs into the lungs every 4 (four) hours as needed for wheezing or shortness of breath (bronchitis). Reported on 03/12/2016 18 g 1   Calcium Carb-Cholecalciferol (CALCIUM/VITAMIN D PO) Take by mouth daily.     clopidogrel (PLAVIX) 75 MG tablet TAKE 1 TABLET(75 MG) BY MOUTH DAILY (Patient taking differently: Take 75 mg by mouth daily.) 90 tablet 3   cyclobenzaprine (FLEXERIL) 10 MG tablet TAKE 1 TABLET BY MOUTH EVERY 8 HOURS AS NEEDED 90 tablet 3   folic acid (FOLVITE) 1 MG tablet Take 1 mg by mouth daily.      lisinopril (ZESTRIL) 5 MG tablet TAKE 1 TABLET(5 MG) BY MOUTH DAILY 90 tablet 1   LORazepam (ATIVAN) 1 MG tablet Take 1 tablet (1 mg total) by mouth 2 (two) times daily. 60 tablet 5   oxyCODONE (OXY IR/ROXICODONE) 5 MG immediate release tablet 1-2 po tid prn pain 180 tablet 0   pantoprazole (PROTONIX) 40 MG tablet Take 1 tablet (40 mg total) by mouth 2 (two) times daily. 60 tablet 5   promethazine (PHENERGAN) 12.5 MG tablet 1-2 tabs po q6h prn nausea 45 tablet 1    rosuvastatin (CRESTOR) 40 MG tablet TAKE 1 TABLET(40 MG) BY MOUTH DAILY 90 tablet 3   fenofibrate 160 MG tablet TAKE 1 TABLET BY MOUTH EVERY DAY WITH FOOD (Patient not taking: Reported on 05/10/2021) 90 tablet 3   nitroGLYCERIN (NITROSTAT) 0.4 MG SL tablet Place 1 tablet (0.4 mg total) under the tongue every 5 (five) minutes as needed for chest pain (MAX 3 doses.). (Patient not taking: Reported on 05/10/2021) 10 tablet 1   No current facility-administered medications for this visit.    Allergies as of 05/10/2021 - Review Complete 05/10/2021  Allergen Reaction Noted   Aspirin Other (See Comments)    Beta adrenergic blockers Other (See Comments)    Citalopram Nausea  And Vomiting 04/19/2020   Nsaids Other (See Comments)    Penicillins Swelling and Other (See Comments)    Prochlorperazine edisylate Other (See Comments)    Sulfonamide derivatives Other (See Comments)    Amitriptyline hcl Palpitations and Other (See Comments)     Family History  Problem Relation Age of Onset   Heart Problems Mother        and thyroid problems   Hyperlipidemia Mother    Diabetes Mother    Breast cancer Mother    Heart failure Father        CHF, heart attack, diabetes, hyperlipidemia   Heart disease Brother        back problems   Hypertension Brother    Kidney failure Maternal Grandmother    Stroke Maternal Grandfather    Cancer Paternal Grandmother        mouth - snuff   Heart attack Paternal Grandfather        stroke, HTN   Hyperlipidemia Brother    Heart attack Brother    Cancer Brother    Coronary artery disease Neg Hx     Social History   Socioeconomic History   Marital status: Married    Spouse name: Not on file   Number of children: Not on file   Years of education: Not on file   Highest education level: Not on file  Occupational History   Not on file  Tobacco Use   Smoking status: Former    Packs/day: 1.00    Years: 35.00    Pack years: 35.00    Types: Cigarettes    Quit date:  07/22/2014    Years since quitting: 6.8   Smokeless tobacco: Never   Tobacco comments:    down to ~2 cigarettes/daily (06/23/14) - Quit Smoking around 07/20/14!  Vaping Use   Vaping Use: Never used  Substance and Sexual Activity   Alcohol use: No    Alcohol/week: 0.0 standard drinks   Drug use: No   Sexual activity: Yes    Partners: Male    Birth control/protection: Surgical  Other Topics Concern   Not on file  Social History Narrative   Not on file   Social Determinants of Health   Financial Resource Strain: Low Risk    Difficulty of Paying Living Expenses: Not hard at all  Food Insecurity: No Food Insecurity   Worried About Running Out of Food in the Last Year: Never true   Ran Out of Food in the Last Year: Never true  Transportation Needs: No Transportation Needs   Lack of Transportation (Medical): No   Lack of Transportation (Non-Medical): No  Physical Activity: Inactive   Days of Exercise per Week: 0 days   Minutes of Exercise per Session: 0 min  Stress: No Stress Concern Present   Feeling of Stress : Not at all  Social Connections: Moderately Integrated   Frequency of Communication with Friends and Family: More than three times a week   Frequency of Social Gatherings with Friends and Family: More than three times a week   Attends Religious Services: 1 to 4 times per year   Active Member of Clubs or Organizations: No   Attends Club or Organization Meetings: Never   Marital Status: Married    Subjective: Review of Systems  Constitutional:  Negative for chills and fever.  HENT:  Negative for congestion and hearing loss.   Eyes:  Negative for blurred vision and double vision.  Respiratory:  Negative for cough and shortness   of breath.   Cardiovascular:  Negative for chest pain and palpitations.  Gastrointestinal:  Positive for abdominal pain and heartburn. Negative for blood in stool, constipation, diarrhea, melena and vomiting.  Genitourinary:  Negative for dysuria  and urgency.  Musculoskeletal:  Negative for joint pain and myalgias.  Skin:  Negative for itching and rash.  Neurological:  Negative for dizziness and headaches.  Psychiatric/Behavioral:  Negative for depression. The patient is not nervous/anxious.     Objective: BP 134/84   Pulse 87   Temp 97.7 F (36.5 C) (Temporal)   Ht 5' 3" (1.6 m)   Wt 203 lb 9.6 oz (92.4 kg)   BMI 36.07 kg/m  Physical Exam Constitutional:      Appearance: Normal appearance.  HENT:     Head: Normocephalic and atraumatic.  Eyes:     Extraocular Movements: Extraocular movements intact.     Conjunctiva/sclera: Conjunctivae normal.  Cardiovascular:     Rate and Rhythm: Normal rate and regular rhythm.  Pulmonary:     Effort: Pulmonary effort is normal.     Breath sounds: Normal breath sounds.  Abdominal:     General: Bowel sounds are normal.     Palpations: Abdomen is soft.  Musculoskeletal:        General: No swelling. Normal range of motion.     Cervical back: Normal range of motion and neck supple.  Skin:    General: Skin is warm and dry.     Coloration: Skin is not jaundiced.  Neurological:     General: No focal deficit present.     Mental Status: She is alert and oriented to person, place, and time.  Psychiatric:        Mood and Affect: Mood normal.        Behavior: Behavior normal.     Assessment: *Chronic GERD *Abdominal pain *Fecal occult blood test positive *Abnormal liver function test  Plan: GERD well-controlled on pantoprazole twice daily.  She can decrease down to once daily and see how she does from a symptomatic standpoint.  Etiology of her abdominal pain remains unclear.  Given her fecal occult blood test positivity, we will schedule her for colonoscopy to further evaluate. The risks including infection, bleed, or perforation as well as benefits, limitations, alternatives and imponderables have been reviewed with the patient. Questions have been answered. All parties  agreeable.  Her aminotransferases have slowly improved since stopping methotrexate.  Full serological work-up has been negative.  Continue to monitor.  Patient states she has not noticed a difference in her rheumatoid since stopping her methotrexate so she may be able to go without this going forward.  Will defer to her rheumatologist.  Patient to follow-up after her procedures.  05/10/2021 10:40 AM   Disclaimer: This note was dictated with voice recognition software. Similar sounding words can inadvertently be transcribed and may not be corrected upon review.

## 2021-05-15 ENCOUNTER — Telehealth: Payer: Self-pay

## 2021-05-15 ENCOUNTER — Other Ambulatory Visit: Payer: Self-pay

## 2021-05-15 MED ORDER — PEG 3350-KCL-NA BICARB-NACL 420 G PO SOLR: 4000.0000 mL | ORAL | 0 refills | Status: DC

## 2021-05-15 NOTE — Telephone Encounter (Signed)
Called pt, TCS w/Propofol ASA 3 w/Dr. Abbey Chatters scheduled for 06/12/21 at 8:30am.   Dr. Abbey Chatters, please advise about her holding Plavix prior to TCS. She said she has held Plavix for 5 days previously.  PA for TCS submitted via [employer]. [employer]# [id number], valid 06/12/21-07/12/21.

## 2021-05-17 NOTE — Telephone Encounter (Signed)
Noted. Instructions and pre-op letter mailed to pt.

## 2021-05-17 NOTE — Telephone Encounter (Signed)
Yes, please hold Plavix for 5 days prior to colonoscopy.  Thank you

## 2021-06-05 DIAGNOSIS — Z860101 Personal history of adenomatous and serrated colon polyps: Secondary | ICD-10-CM

## 2021-06-05 DIAGNOSIS — Z86010 Personal history of colonic polyps: Secondary | ICD-10-CM

## 2021-06-05 HISTORY — DX: Personal history of adenomatous and serrated colon polyps: Z86.0101

## 2021-06-05 HISTORY — DX: Personal history of colonic polyps: Z86.010

## 2021-06-05 NOTE — Patient Instructions (Signed)
Brenda [name]  06/05/2021     '@PREFPERIOPPHARMACY'$ @   Your procedure is scheduled on  06/12/2021.   Report to [facility] at  0700  A.M.   Call this number if you have problems the morning of surgery:  [phone]   Remember:  Follow the diet and prep instructions given to you by the office.    Take these medicines the morning of surgery with A SIP OF WATER                   flexeril(if needed), ativan (if needed), oxycodone(if needed), protonix.   Use your inhaler before you come and bring your rescue inhaler with you.    Do not wear jewelry, make-up or nail polish.  Do not wear lotions, powders, or perfumes, or deodorant.  Do not shave 48 hours prior to surgery.  Men may shave face and neck.  Do not bring valuables to the hospital.  [facility] is not responsible for any belongings or valuables.  Contacts, dentures or bridgework may not be worn into surgery.  Leave your suitcase in the car.  After surgery it may be brought to your room.  For patients admitted to the hospital, discharge time will be determined by your treatment team.  Patients discharged the day of surgery will not be allowed to drive home and must have someone with them for 24 hours.    Special instructions:   DO NOT smoke tobacco or vape for 24 hours before your procedure.  Please read over the following fact sheets that you were given. Anesthesia Post-op Instructions and Care and Recovery After Surgery      Colonoscopy, Adult, Care After This sheet gives you information about how to care for yourself after your procedure. Your health care provider may also give you more specific instructions. If you have problems or questions, contact your health careprovider. What can I expect after the procedure? After the procedure, it is common to have: A small amount of blood in your stool for 24 hours after the procedure. Some gas. Mild cramping or bloating of your abdomen. Follow these  instructions at home: Eating and drinking  Drink enough fluid to keep your urine pale yellow. Follow instructions from your health care provider about eating or drinking restrictions. Resume your normal diet as instructed by your health care provider. Avoid heavy or fried foods that are hard to digest.  Activity Rest as told by your health care provider. Avoid sitting for a long time without moving. Get up to take short walks every 1-2 hours. This is important to improve blood flow and breathing. Ask for help if you feel weak or unsteady. Return to your normal activities as told by your health care provider. Ask your health care provider what activities are safe for you. Managing cramping and bloating  Try walking around when you have cramps or feel bloated. Apply heat to your abdomen as told by your health care provider. Use the heat source that your health care provider recommends, such as a moist heat pack or a heating pad. Place a towel between your skin and the heat source. Leave the heat on for 20-30 minutes. Remove the heat if your skin turns bright red. This is especially important if you are unable to feel pain, heat, or cold. You may have a greater risk of getting burned.  General instructions If you were given a sedative during the procedure, it can affect you  for several hours. Do not drive or operate machinery until your health care provider says that it is safe. For the first 24 hours after the procedure: Do not sign important documents. Do not drink alcohol. Do your regular daily activities at a slower pace than normal. Eat soft foods that are easy to digest. Take over-the-counter and prescription medicines only as told by your health care provider. Keep all follow-up visits as told by your health care provider. This is important. Contact a health care provider if: You have blood in your stool 2-3 days after the procedure. Get help right away if you have: More than a  small spotting of blood in your stool. Large blood clots in your stool. Swelling of your abdomen. Nausea or vomiting. A fever. Increasing pain in your abdomen that is not relieved with medicine. Summary After the procedure, it is common to have a small amount of blood in your stool. You may also have mild cramping and bloating of your abdomen. If you were given a sedative during the procedure, it can affect you for several hours. Do not drive or operate machinery until your health care provider says that it is safe. Get help right away if you have a lot of blood in your stool, nausea or vomiting, a fever, or increased pain in your abdomen. This information is not intended to replace advice given to you by your health care provider. Make sure you discuss any questions you have with your healthcare provider. Document Revised: 10/16/2019 Document Reviewed: 05/18/2019 Elsevier Patient Education  [place] After This sheet gives you information about how to care for yourself after your procedure. Your health care provider may also give you more specific instructions. If you have problems or questions, contact your health careprovider. What can I expect after the procedure? After the procedure, it is common to have: Tiredness. Forgetfulness about what happened after the procedure. Impaired judgment for important decisions. Nausea or vomiting. Some difficulty with balance. Follow these instructions at home: For the time period you were told by your health care provider:     Rest as needed. Do not participate in activities where you could fall or become injured. Do not drive or use machinery. Do not drink alcohol. Do not take sleeping pills or medicines that cause drowsiness. Do not make important decisions or sign legal documents. Do not take care of children on your own. Eating and drinking Follow the diet that is recommended by your health care  provider. Drink enough fluid to keep your urine pale yellow. If you vomit: Drink water, juice, or soup when you can drink without vomiting. Make sure you have little or no nausea before eating solid foods. General instructions Have a responsible adult stay with you for the time you are told. It is important to have someone help care for you until you are awake and alert. Take over-the-counter and prescription medicines only as told by your health care provider. If you have sleep apnea, surgery and certain medicines can increase your risk for breathing problems. Follow instructions from your health care provider about wearing your sleep device: Anytime you are sleeping, including during daytime naps. While taking prescription pain medicines, sleeping medicines, or medicines that make you drowsy. Avoid smoking. Keep all follow-up visits as told by your health care provider. This is important. Contact a health care provider if: You keep feeling nauseous or you keep vomiting. You feel light-headed. You are still sleepy or having  trouble with balance after 24 hours. You develop a rash. You have a fever. You have redness or swelling around the IV site. Get help right away if: You have trouble breathing. You have new-onset confusion at home. Summary For several hours after your procedure, you may feel tired. You may also be forgetful and have poor judgment. Have a responsible adult stay with you for the time you are told. It is important to have someone help care for you until you are awake and alert. Rest as told. Do not drive or operate machinery. Do not drink alcohol or take sleeping pills. Get help right away if you have trouble breathing, or if you suddenly become confused. This information is not intended to replace advice given to you by your health care provider. Make sure you discuss any questions you have with your healthcare provider. Document Revised: 07/07/2020 Document Reviewed:  09/24/2019 Elsevier Patient Education  2022 [employer].

## 2021-06-08 ENCOUNTER — Encounter (HOSPITAL_COMMUNITY): Payer: Self-pay

## 2021-06-08 ENCOUNTER — Encounter (HOSPITAL_COMMUNITY)
Admission: RE | Admit: 2021-06-08 | Discharge: 2021-06-08 | Disposition: A | Discharge status: Home / Self Care | Payer: Medicare HMO | Source: Ambulatory Visit | Attending: Internal Medicine | Admitting: Internal Medicine

## 2021-06-08 ENCOUNTER — Other Ambulatory Visit: Payer: Self-pay

## 2021-06-12 ENCOUNTER — Encounter (HOSPITAL_COMMUNITY): Payer: Self-pay

## 2021-06-12 ENCOUNTER — Ambulatory Visit (HOSPITAL_COMMUNITY): Payer: Medicare HMO | Admitting: Certified Registered"

## 2021-06-12 ENCOUNTER — Ambulatory Visit (HOSPITAL_COMMUNITY)
Admission: RE | Admit: 2021-06-12 | Discharge: 2021-06-12 | Disposition: A | Discharge status: Home / Self Care | Payer: Medicare HMO | Attending: Internal Medicine | Admitting: Internal Medicine

## 2021-06-12 ENCOUNTER — Encounter (HOSPITAL_COMMUNITY)
Admission: RE | Disposition: A | Discharge status: Home / Self Care | Payer: Self-pay | Source: Home / Self Care | Attending: Internal Medicine

## 2021-06-12 DIAGNOSIS — Z87891 Personal history of nicotine dependence: Secondary | ICD-10-CM | POA: Insufficient documentation

## 2021-06-12 DIAGNOSIS — R195 Other fecal abnormalities: Secondary | ICD-10-CM | POA: Insufficient documentation

## 2021-06-12 DIAGNOSIS — K648 Other hemorrhoids: Secondary | ICD-10-CM | POA: Insufficient documentation

## 2021-06-12 DIAGNOSIS — Z8616 Personal history of COVID-19: Secondary | ICD-10-CM | POA: Insufficient documentation

## 2021-06-12 DIAGNOSIS — Z882 Allergy status to sulfonamides status: Secondary | ICD-10-CM | POA: Diagnosis not present

## 2021-06-12 DIAGNOSIS — Z96651 Presence of right artificial knee joint: Secondary | ICD-10-CM | POA: Diagnosis not present

## 2021-06-12 DIAGNOSIS — Z886 Allergy status to analgesic agent status: Secondary | ICD-10-CM | POA: Diagnosis not present

## 2021-06-12 DIAGNOSIS — Z888 Allergy status to other drugs, medicaments and biological substances status: Secondary | ICD-10-CM | POA: Diagnosis not present

## 2021-06-12 DIAGNOSIS — R1031 Right lower quadrant pain: Secondary | ICD-10-CM | POA: Diagnosis not present

## 2021-06-12 DIAGNOSIS — Z79899 Other long term (current) drug therapy: Secondary | ICD-10-CM | POA: Diagnosis not present

## 2021-06-12 DIAGNOSIS — D125 Benign neoplasm of sigmoid colon: Secondary | ICD-10-CM | POA: Insufficient documentation

## 2021-06-12 DIAGNOSIS — Z880 Allergy status to penicillin: Secondary | ICD-10-CM | POA: Insufficient documentation

## 2021-06-12 DIAGNOSIS — J449 Chronic obstructive pulmonary disease, unspecified: Secondary | ICD-10-CM | POA: Diagnosis not present

## 2021-06-12 DIAGNOSIS — D123 Benign neoplasm of transverse colon: Secondary | ICD-10-CM | POA: Diagnosis not present

## 2021-06-12 DIAGNOSIS — K635 Polyp of colon: Secondary | ICD-10-CM | POA: Diagnosis not present

## 2021-06-12 DIAGNOSIS — Z7902 Long term (current) use of antithrombotics/antiplatelets: Secondary | ICD-10-CM | POA: Diagnosis not present

## 2021-06-12 HISTORY — PX: COLONOSCOPY W/ POLYPECTOMY: SHX1380

## 2021-06-12 HISTORY — PX: POLYPECTOMY: SHX5525

## 2021-06-12 HISTORY — PX: COLONOSCOPY WITH PROPOFOL: SHX5780

## 2021-06-12 LAB — GLUCOSE, CAPILLARY: Glucose-Capillary: 123 mg/dL — ABNORMAL HIGH (ref 70–99)

## 2021-06-12 SURGERY — COLONOSCOPY WITH PROPOFOL
Anesthesia: General

## 2021-06-12 MED ORDER — PROMETHAZINE HCL 12.5 MG PO TABS: ORAL_TABLET | ORAL | 1 refills | Status: DC

## 2021-06-12 MED ORDER — LIDOCAINE HCL (CARDIAC) PF 100 MG/5ML IV SOSY
PREFILLED_SYRINGE | INTRAVENOUS | Status: DC | PRN
  Administered 2021-06-12: 50 mg via INTRAVENOUS

## 2021-06-12 MED ORDER — PROPOFOL 10 MG/ML IV BOLUS
INTRAVENOUS | Status: DC | PRN
  Administered 2021-06-12: 125 ug/kg/min via INTRAVENOUS
  Administered 2021-06-12: 100 mg via INTRAVENOUS

## 2021-06-12 MED ORDER — ONDANSETRON HCL 4 MG/2ML IJ SOLN
INTRAMUSCULAR | Status: AC
  Administered 2021-06-12: 3 mg via INTRAVENOUS
  Filled 2021-06-12: qty 2

## 2021-06-12 MED ORDER — ONDANSETRON HCL 4 MG/2ML IJ SOLN: 3.0000 mg | Freq: Once | INTRAMUSCULAR | Status: AC

## 2021-06-12 MED ORDER — LACTATED RINGERS IV SOLN: INTRAVENOUS | Status: DC

## 2021-06-12 NOTE — H&P (Signed)
Primary Care Physician:  Tammi Sou, MD Primary Gastroenterologist:  Dr. Abbey Chatters  Pre-Procedure History & Physical: HPI:  Brenda [name] is a 65 y.o. female is here for a colonoscopy to be performed for abdominal pain and positive hemoccult testing. No previous colonoscopy.   Past Medical History:  Diagnosis Date   Aortic valve stenosis    "very mild"   Atypical chest pain 2007; 2015   cardiolyte neg, echo nl, cath showed mild/nonobstructive LAD disease   Bilateral primary osteoarthritis of knee 06/30/2012   Right >L.  Right unicompartmental knee arthroplasty 10/25/18   CAD (coronary artery disease)    Chronic LBP    Multiple surgeries   COPD (chronic obstructive pulmonary disease) ([place])    COVID-19 virus infection 06/02/2019   Eval at [facility] ED and d/c'd home.  Admitted 06/12/19 with hypoxic RF and bilat infiltrates.   DDD (degenerative disc disease)    spinal stenosis   Elevated liver enzymes    2021 after starting methotrexate. 2022->rheum d/c'd her methotrexate   Her abd u/s 12/05/20 showed fatty liver, o/w normal.   Fatty liver    11/2020 liver u/s   Gastritis    H pylori NEG on EGD 02/2021   GERD (gastroesophageal reflux disease)    History of GI bleed    NSAIDS   Hyperlipidemia    mixed   Microscopic hematuria    full w/ u unrevealing X 2   Normal nuclear stress test 11/11 and 06/2014   negative, EF normal   Palpitations 2006   48H holter neg   PONV (postoperative nausea and vomiting)    Positive occult stool blood test    iFOB positive 04/2021->ref to GI   Prediabetes    Highest A1c 6.1% as of 03/2017   RBBB (right bundle branch block)    Recurrent UTI    Seronegative rheumatoid arthritis ([place]) 10/2019   multiple sites->pred started, methotrex started 10/2019; rheum to add humira as of 03/2020   Tobacco dependence in remission    Quit fall 2015.      Past Surgical History:  Procedure Laterality Date   ABDOMINAL HYSTERECTOMY  1997   DUB   APPENDECTOMY  1984    BIOPSY  02/14/2021   Procedure: BIOPSY;  Surgeon: Eloise Harman, DO;  Location: [facility];  Service: Endoscopy;;   CARDIAC CATHETERIZATION  10/09/2005   no CAD, mildly elevated LVEDP, normal LV function (Dr. Gerrie Nordmann)   CARDIAC CATHETERIZATION N/A 05/16/2015   Mild non-obstructive CAD--med mgmt.  Procedure: Left Heart Cath and Coronary Angiography;  Surgeon: Pixie Casino, MD;  Location: [place] [facility];  Service: Cardiovascular;  Laterality: N/A;   CARDIOVASCULAR STRESS TEST  06/2014   normal lexiscan NST   CARDIOVASCULAR STRESS TEST  2006   persantine - no ischemia, low risk    ESOPHAGOGASTRODUODENOSCOPY  02/14/2021   Gastritis ( H pylori NEG) w/out bleeding   ESOPHAGOGASTRODUODENOSCOPY (EGD) WITH PROPOFOL N/A 02/14/2021   Procedure: ESOPHAGOGASTRODUODENOSCOPY (EGD) WITH PROPOFOL;  Surgeon: Eloise Harman, DO;  Location: [facility];  Service: Endoscopy;  Laterality: N/A;  AM   KNEE ARTHROSCOPY Bilateral    left wrist ganglion cyst excision  2010   [place]   right iliac crest bone graft+metal instrumentation; 2005 metal removal and decompression, 2011 lumbar decompression 4-11, then stabilization/ instrumentation done 09-19-10: L2,L3, L5 left and L2 , L3, L4 right pedical remnant L4 left embedded. Left iliac crest bone graft-- Dr Velna Ochs  LUMBAR SPINE SURGERY  02/10/2016   Dr. Velna Ochs: lumbar decompression, instrumentation removal, and fusion exploration--HELPED her a lot, esp her radicular leg pains.   OOPHORECTOMY Right    cyst   PARTIAL KNEE ARTHROPLASTY Right 11/04/2018   Procedure: UNICOMPARTMENTAL KNEE;  Surgeon: Renette Butters, MD;  Location: [facility];  Service: Orthopedics;  Laterality: Right;  Adductor Block   TONSILLECTOMY  65 yrs old   TRANSTHORACIC ECHOCARDIOGRAM  2006   EF=>55%, trace MR, mild TR, trace AV regurg, trace pulm valve regurg     Prior to Admission medications   Medication Sig Start Date End Date Taking? Authorizing Provider   acetaminophen (TYLENOL) 500 MG tablet Take 500 mg by mouth every 6 (six) hours as needed for fever.   Yes [provider]  Adalimumab (HUMIRA) 40 MG/0.8ML PSKT Inject 40 mg into the skin every 14 (fourteen) days.   Yes [provider]  Calcium Carb-Cholecalciferol (CALCIUM/VITAMIN D PO) Take 1 tablet by mouth daily.   Yes [provider]  cyclobenzaprine (FLEXERIL) 10 MG tablet TAKE 1 TABLET BY MOUTH EVERY 8 HOURS AS NEEDED Patient taking differently: Take 10 mg by mouth every 8 (eight) hours as needed for muscle spasms. 03/16/21  Yes McGowen, Adrian Blackwater, MD  fenofibrate 160 MG tablet TAKE 1 TABLET BY MOUTH EVERY DAY WITH FOOD Patient taking differently: Take 160 mg by mouth daily. 09/13/20  Yes McGowen, Adrian Blackwater, MD  folic acid (FOLVITE) 1 MG tablet Take 1 mg by mouth daily.  10/27/19  Yes [provider]  lisinopril (ZESTRIL) 5 MG tablet TAKE 1 TABLET(5 MG) BY MOUTH DAILY Patient taking differently: Take 5 mg by mouth daily. 04/21/21  Yes McGowen, Adrian Blackwater, MD  LORazepam (ATIVAN) 1 MG tablet Take 1 tablet (1 mg total) by mouth 2 (two) times daily. 03/29/21  Yes McGowen, Adrian Blackwater, MD  nitroGLYCERIN (NITROSTAT) 0.4 MG SL tablet Place 1 tablet (0.4 mg total) under the tongue every 5 (five) minutes as needed for chest pain (MAX 3 doses.). 09/24/19  Yes McGowen, Adrian Blackwater, MD  oxyCODONE (OXY IR/ROXICODONE) 5 MG immediate release tablet 1-2 po tid prn pain Patient taking differently: Take 5-10 mg by mouth every 4 (four) hours as needed for moderate pain. 04/19/21  Yes McGowen, Adrian Blackwater, MD  pantoprazole (PROTONIX) 40 MG tablet Take 1 tablet (40 mg total) by mouth 2 (two) times daily. 02/14/21 08/13/21 Yes Aadam Zhen, Elon Alas, DO  polyethylene glycol-electrolytes (TRILYTE) 420 g solution Take 4,000 mLs by mouth as directed. 05/15/21  Yes Knoah Nedeau K, DO  rosuvastatin (CRESTOR) 40 MG tablet TAKE 1 TABLET(40 MG) BY MOUTH DAILY Patient taking differently: Take 40 mg by mouth  daily. 09/13/20  Yes McGowen, Adrian Blackwater, MD  albuterol (VENTOLIN HFA) 108 (90 Base) MCG/ACT inhaler Inhale 2 puffs into the lungs every 4 (four) hours as needed for wheezing or shortness of breath (bronchitis). Reported on 03/12/2016 10/17/20   Tammi Sou, MD  clopidogrel (PLAVIX) 75 MG tablet TAKE 1 TABLET(75 MG) BY MOUTH DAILY Patient taking differently: Take 75 mg by mouth daily. 09/13/20   McGowen, Adrian Blackwater, MD  promethazine (PHENERGAN) 12.5 MG tablet 1-2 tabs po q6h prn nausea Patient not taking: No sig reported 01/16/21   Tammi Sou, MD    Allergies as of 05/15/2021 - Review Complete 05/10/2021  Allergen Reaction Noted   Aspirin Other (See Comments)    Beta adrenergic blockers Other (See Comments)    Citalopram Nausea And Vomiting 04/19/2020  Nsaids Other (See Comments)    Penicillins Swelling and Other (See Comments)    Prochlorperazine edisylate Other (See Comments)    Sulfonamide derivatives Other (See Comments)    Amitriptyline hcl Palpitations and Other (See Comments)     Family History  Problem Relation Age of Onset   Heart Problems Mother        and thyroid problems   Hyperlipidemia Mother    Diabetes Mother    Breast cancer Mother    Heart failure Father        CHF, heart attack, diabetes, hyperlipidemia   Heart disease Brother        back problems   Hypertension Brother    Kidney failure Maternal Grandmother    Stroke Maternal Grandfather    Cancer Paternal Grandmother        mouth - snuff   Heart attack Paternal Grandfather        stroke, HTN   Hyperlipidemia Brother    Heart attack Brother    Cancer Brother    Coronary artery disease Neg Hx     Social History   Socioeconomic History   Marital status: Married    Spouse name: Not on file   Number of children: Not on file   Years of education: Not on file   Highest education level: Not on file  Occupational History   Not on file  Tobacco Use   Smoking status: Former    Packs/day: 1.00     Years: 35.00    Pack years: 35.00    Types: Cigarettes    Quit date: 07/22/2014    Years since quitting: 6.8   Smokeless tobacco: Never   Tobacco comments:    down to ~2 cigarettes/daily (06/23/14) - Quit Smoking around 07/20/14!  Vaping Use   Vaping Use: Never used  Substance and Sexual Activity   Alcohol use: No    Alcohol/week: 0.0 standard drinks   Drug use: No   Sexual activity: Yes    Partners: Male    Birth control/protection: Surgical  Other Topics Concern   Not on file  Social History Narrative   Not on file   Social Determinants of Health   Financial Resource Strain: Low Risk    Difficulty of Paying Living Expenses: Not hard at all  Food Insecurity: No Food Insecurity   Worried About Charity fundraiser in the Last Year: Never true   [place] in the Last Year: Never true  Transportation Needs: No Transportation Needs   Lack of Transportation (Medical): No   Lack of Transportation (Non-Medical): No  Physical Activity: Inactive   Days of Exercise per Week: 0 days   Minutes of Exercise per Session: 0 min  Stress: No Stress Concern Present   Feeling of Stress : Not at all  Social Connections: Moderately Integrated   Frequency of Communication with Friends and Family: More than three times a week   Frequency of Social Gatherings with Friends and Family: More than three times a week   Attends Religious Services: 1 to 4 times per year   Active Member of [employer] or Organizations: No   Attends Music therapist: Never   Marital Status: Married  Human resources officer Violence: Not At Risk   Fear of Current or Ex-Partner: No   Emotionally Abused: No   Physically Abused: No   Sexually Abused: No    Review of Systems: See HPI, otherwise negative ROS  Physical Exam: Vital signs in last  24 hours: Temp:  [98.6 F (37 C)] 98.6 F (37 C) (08/08 0707) Pulse Rate:  [80] 80 (08/08 0707) Resp:  [18] 18 (08/08 0707) BP: (121)/(75) 121/75 (08/08 0707) SpO2:   [96 %] 96 % (08/08 0707)   General:   Alert,  Well-developed, well-nourished, pleasant and cooperative in NAD Head:  Normocephalic and atraumatic. Eyes:  Sclera clear, no icterus.   Conjunctiva pink. Ears:  Normal auditory acuity. Nose:  No deformity, discharge,  or lesions. Mouth:  No deformity or lesions, dentition normal. Neck:  Supple; no masses or thyromegaly. Lungs:  Clear throughout to auscultation.   No wheezes, crackles, or rhonchi. No acute distress. Heart:  Regular rate and rhythm; no murmurs, clicks, rubs,  or gallops. Abdomen:  Soft, nontender and nondistended. No masses, hepatosplenomegaly or hernias noted. Normal bowel sounds, without guarding, and without rebound.   Msk:  Symmetrical without gross deformities. Normal posture. Extremities:  Without clubbing or edema. Neurologic:  Alert and  oriented x4;  grossly normal neurologically. Skin:  Intact without significant lesions or rashes. Cervical Nodes:  No significant cervical adenopathy. Psych:  Alert and cooperative. Normal mood and affect.  Impression/Plan: Toy Cookey is here for a colonoscopy to be performed for abdominal pain and positive hemoccult testing. No previous colonoscopy.   The risks of the procedure including infection, bleed, or perforation as well as benefits, limitations, alternatives and imponderables have been reviewed with the patient. Questions have been answered. All parties agreeable.

## 2021-06-12 NOTE — Progress Notes (Signed)
Pt c/o chronic back pain and lower abdominal pain that she rates 3/10.  Both pt and spouse state she has been having this abdominal pain for several months.  Advised to return to ED for worsening pain/nausea/vomiting/bleeding.  Both pt and spouse verbalized understanding.

## 2021-06-12 NOTE — Discharge Instructions (Addendum)
  Colonoscopy Discharge Instructions  Read the instructions outlined below and refer to this sheet in the next few weeks. These discharge instructions provide you with general information on caring for yourself after you leave the hospital. Your doctor may also give you specific instructions. While your treatment has been planned according to the most current medical practices available, unavoidable complications occasionally occur.   ACTIVITY You may resume your regular activity, but move at a slower pace for the next 24 hours.  Take frequent rest periods for the next 24 hours.  Walking will help get rid of the air and reduce the bloated feeling in your belly (abdomen).  No driving for 24 hours (because of the medicine (anesthesia) used during the test).   Do not sign any important legal documents or operate any machinery for 24 hours (because of the anesthesia used during the test).  NUTRITION Drink plenty of fluids.  You may resume your normal diet as instructed by your doctor.  Begin with a light meal and progress to your normal diet. Heavy or fried foods are harder to digest and may make you feel sick to your stomach (nauseated).  Avoid alcoholic beverages for 24 hours or as instructed.  MEDICATIONS You may resume your normal medications unless your doctor tells you otherwise.  WHAT YOU CAN EXPECT TODAY Some feelings of bloating in the abdomen.  Passage of more gas than usual.  Spotting of blood in your stool or on the toilet paper.  IF YOU HAD POLYPS REMOVED DURING THE COLONOSCOPY: No aspirin products for 7 days or as instructed.  No alcohol for 7 days or as instructed.  Eat a soft diet for the next 24 hours.  FINDING OUT THE RESULTS OF YOUR TEST Not all test results are available during your visit. If your test results are not back during the visit, make an appointment with your caregiver to find out the results. Do not assume everything is normal if you have not heard from your  caregiver or the medical facility. It is important for you to follow up on all of your test results.  SEEK IMMEDIATE MEDICAL ATTENTION IF: You have more than a spotting of blood in your stool.  Your belly is swollen (abdominal distention).  You are nauseated or vomiting.  You have a temperature over 101.  You have abdominal pain or discomfort that is severe or gets worse throughout the day.   Your colonoscopy revealed 6 polyp(s) which I removed successfully. Await pathology results, my office will contact you. I recommend repeating colonoscopy in 3-5 years for surveillance purposes. Follow up with GI in 3 months--OFFICE TO CALL WITH APPOINTMENT   I hope you have a great rest of your week!  Elon Alas. Abbey Chatters, D.O. Gastroenterology and Hepatology [facility]

## 2021-06-12 NOTE — Anesthesia Preprocedure Evaluation (Addendum)
Anesthesia Evaluation  Patient identified by MRN, date of birth, ID band Patient awake    History of Anesthesia Complications (+) PONV  Airway Mallampati: II  TM Distance: >3 FB Neck ROM: Limited    Dental no notable dental hx.    Pulmonary pneumonia, COPD, former smoker,    Pulmonary exam normal breath sounds clear to auscultation       Cardiovascular + angina + CAD  Normal cardiovascular exam+ dysrhythmias  Rhythm:Regular Rate:Normal     Neuro/Psych PSYCHIATRIC DISORDERS Anxiety  Neuromuscular disease    GI/Hepatic GERD  ,  Endo/Other    Renal/GU      Musculoskeletal  (+) Arthritis , Osteoarthritis,    Abdominal   Peds  Hematology   Anesthesia Other Findings Fecal occult blood test positive  Reproductive/Obstetrics                            Anesthesia Physical Anesthesia Plan  ASA: 3  Anesthesia Plan: General   Post-op Pain Management:    Induction:   PONV Risk Score and Plan:   Airway Management Planned:   Additional Equipment:   Intra-op Plan:   Post-operative Plan:   Informed Consent: I have reviewed the patients History and Physical, chart, labs and discussed the procedure including the risks, benefits and alternatives for the proposed anesthesia with the patient or authorized representative who has indicated his/her understanding and acceptance.       Plan Discussed with:   Anesthesia Plan Comments:         Anesthesia Quick Evaluation

## 2021-06-12 NOTE — Op Note (Signed)
[facility] Patient Name: Brenda [name] Procedure Date: 06/12/2021 8:09 AM MRN: [id number] Date of Birth: Nov 09, 1955 Attending MD: Elon Alas. Abbey Chatters DO CSN: [id number] Age: 65 Admit Type: Outpatient Procedure:                Colonoscopy Indications:              Abdominal pain in the right lower quadrant, Heme                            positive stool Providers:                Elon Alas. Josha Weekley, DO, Otis Peak B. Sharon Seller, RN,                            Nelma Rothman, Technician Referring MD:              Medicines:                See the Anesthesia note for documentation of the                            administered medications Complications:            No immediate complications. Estimated Blood Loss:     Estimated blood loss was minimal. Procedure:                Pre-Anesthesia Assessment:                           - The anesthesia plan was to use monitored                            anesthesia care (MAC).                           After obtaining informed consent, the colonoscope                            was passed under direct vision. Throughout the                            procedure, the patient's blood pressure, pulse, and                            oxygen saturations were monitored continuously. The                            [id number]-HQ190L ([id number]) scope was introduced through                            the anus and advanced to the the terminal ileum,                            with identification of the appendiceal orifice and                            IC valve. The colonoscopy was  performed without                            difficulty. The patient tolerated the procedure                            well. The quality of the bowel preparation was                            evaluated using the BBPS [facility] Bowel Preparation                            Scale) with scores of: Right Colon = 3, Transverse                            Colon = 3 and Left Colon = 3 (entire mucosa seen                             well with no residual staining, small fragments of                            stool or opaque liquid). The total BBPS score                            equals 9. Scope In: 8:14:53 AM Scope Out: 8:37:44 AM Scope Withdrawal Time: 0 hours 16 minutes 36 seconds  Total Procedure Duration: 0 hours 22 minutes 51 seconds  Findings:      The perianal and digital rectal examinations were normal.      Non-bleeding internal hemorrhoids were found during endoscopy.      Four sessile polyps were found in the transverse colon. The polyps were       4 to 8 mm in size. These polyps were removed with a cold snare.       Resection and retrieval were complete.      Two sessile polyps were found in the sigmoid colon. The polyps were 5 to       7 mm in size. These polyps were removed with a cold snare. Resection and       retrieval were complete. Impression:               - Non-bleeding internal hemorrhoids.                           - Four 4 to 8 mm polyps in the transverse colon,                            removed with a cold snare. Resected and retrieved.                           - Two 5 to 7 mm polyps in the sigmoid colon,                            removed with a cold snare. Resected and retrieved. Moderate Sedation:      Per Anesthesia  Care Recommendation:           - Patient has a contact number available for                            emergencies. The signs and symptoms of potential                            delayed complications were discussed with the                            patient. Return to normal activities tomorrow.                            Written discharge instructions were provided to the                            patient.                           - Resume previous diet.                           - Continue present medications.                           - Await pathology results.                           - Repeat colonoscopy in 3 - 5 years for                             surveillance.                           - Return to GI clinic in 3 months. Procedure Code(s):        --- Professional ---                           [phone], Colonoscopy, flexible; with removal of                            tumor(s), polyp(s), or other lesion(s) by snare                            technique Diagnosis Code(s):        --- Professional ---                           K63.5, Polyp of colon                           K64.8, Other hemorrhoids                           R10.31, Right lower quadrant pain  R19.5, Other fecal abnormalities CPT copyright 2019 American Medical Association. All rights reserved. The codes documented in this report are preliminary and upon coder review may  be revised to meet current compliance requirements. Elon Alas. Abbey Chatters, DO [place] Abbey Chatters, DO 06/12/2021 8:40:48 AM This report has been signed electronically. Number of Addenda: 0

## 2021-06-12 NOTE — Transfer of Care (Signed)
Immediate Anesthesia Transfer of Care Note  Patient: Brenda [name]  Procedure(s) Performed: COLONOSCOPY WITH PROPOFOL POLYPECTOMY  Patient Location: Short Stay  Anesthesia Type:General  Level of Consciousness: awake, alert , oriented and patient cooperative  Airway & Oxygen Therapy: Patient Spontanous Breathing  Post-op Assessment: Report given to RN, Post -op Vital signs reviewed and stable and Patient moving all extremities X 4  Post vital signs: Reviewed and stable  Last Vitals:  Vitals Value Taken Time  BP    Temp    Pulse    Resp    SpO2      Last Pain:  Vitals:   06/12/21 0811  TempSrc:   PainSc: 0-No pain      Patients Stated Pain Goal: 5 ([id number])  Complications: No notable events documented.

## 2021-06-13 ENCOUNTER — Encounter: Payer: Self-pay | Admitting: Family Medicine

## 2021-06-13 DIAGNOSIS — J449 Chronic obstructive pulmonary disease, unspecified: Secondary | ICD-10-CM | POA: Diagnosis not present

## 2021-06-13 DIAGNOSIS — M79643 Pain in unspecified hand: Secondary | ICD-10-CM | POA: Diagnosis not present

## 2021-06-13 DIAGNOSIS — Z79899 Other long term (current) drug therapy: Secondary | ICD-10-CM | POA: Diagnosis not present

## 2021-06-13 DIAGNOSIS — M79673 Pain in unspecified foot: Secondary | ICD-10-CM | POA: Diagnosis not present

## 2021-06-13 DIAGNOSIS — M1990 Unspecified osteoarthritis, unspecified site: Secondary | ICD-10-CM | POA: Diagnosis not present

## 2021-06-13 DIAGNOSIS — M0609 Rheumatoid arthritis without rheumatoid factor, multiple sites: Secondary | ICD-10-CM | POA: Diagnosis not present

## 2021-06-13 DIAGNOSIS — M549 Dorsalgia, unspecified: Secondary | ICD-10-CM | POA: Diagnosis not present

## 2021-06-13 DIAGNOSIS — I2510 Atherosclerotic heart disease of native coronary artery without angina pectoris: Secondary | ICD-10-CM | POA: Diagnosis not present

## 2021-06-13 DIAGNOSIS — E669 Obesity, unspecified: Secondary | ICD-10-CM | POA: Diagnosis not present

## 2021-06-13 LAB — HEPATIC FUNCTION PANEL
ALT: 69 U/L — AB (ref 7–35)
AST: 49 — AB (ref 13–35)
Alkaline Phosphatase: 81 (ref 25–125)
Bilirubin, Total: 0.5

## 2021-06-13 LAB — SURGICAL PATHOLOGY

## 2021-06-13 LAB — CBC: RBC: 4.35 (ref 3.87–5.11)

## 2021-06-13 LAB — CBC AND DIFFERENTIAL
HCT: 42 (ref 36–46)
Hemoglobin: 13.8 (ref 12.0–16.0)
Neutrophils Absolute: 4.1
Platelets: 147 10*3/uL — AB (ref 150–400)
WBC: 7.7

## 2021-06-13 LAB — BASIC METABOLIC PANEL
BUN: 6 (ref 4–21)
CO2: 33 — AB (ref 13–22)
Chloride: 105 (ref 99–108)
Creatinine: 0.7 (ref 0.5–1.1)
Glucose: 97
Potassium: 4.4 mEq/L (ref 3.5–5.1)
Sodium: 144 (ref 137–147)

## 2021-06-13 LAB — COMPREHENSIVE METABOLIC PANEL
Albumin: 3.6 (ref 3.5–5.0)
Calcium: 9.9 (ref 8.7–10.7)
Globulin: 3.4
eGFR: 93

## 2021-06-13 LAB — POCT ERYTHROCYTE SEDIMENTATION RATE, NON-AUTOMATED: Sed Rate: 10

## 2021-06-13 NOTE — Anesthesia Postprocedure Evaluation (Signed)
Anesthesia Post Note  Patient: Brenda [name]  Procedure(s) Performed: COLONOSCOPY WITH PROPOFOL POLYPECTOMY  Patient location during evaluation: PACU Anesthesia Type: General Level of consciousness: awake and alert Pain management: pain level controlled Vital Signs Assessment: post-procedure vital signs reviewed and stable Respiratory status: spontaneous breathing, nonlabored ventilation, respiratory function stable and patient connected to nasal cannula oxygen Cardiovascular status: blood pressure returned to baseline and stable Postop Assessment: no apparent nausea or vomiting Anesthetic complications: no   No notable events documented.   Last Vitals:  Vitals:   06/12/21 0707 06/12/21 0840  BP: 121/75 121/72  Pulse: 80   Resp: 18 17  Temp: 37 C 36.8 C  SpO2: 96% 97%    Last Pain:  Vitals:   06/12/21 0840  TempSrc: Oral  PainSc: [place]

## 2021-06-16 ENCOUNTER — Encounter: Payer: Self-pay | Admitting: *Deleted

## 2021-06-20 ENCOUNTER — Encounter (HOSPITAL_COMMUNITY): Payer: Self-pay | Admitting: Internal Medicine

## 2021-07-20 ENCOUNTER — Ambulatory Visit: Payer: Medicare HMO | Admitting: Family Medicine

## 2021-07-20 ENCOUNTER — Encounter: Payer: Self-pay | Admitting: Family Medicine

## 2021-07-20 ENCOUNTER — Other Ambulatory Visit: Payer: Self-pay

## 2021-07-20 VITALS — BP 111/66 | HR 89 | Temp 98.0°F | Ht 63.0 in | Wt 207.4 lb

## 2021-07-20 DIAGNOSIS — E7800 Pure hypercholesterolemia, unspecified: Secondary | ICD-10-CM | POA: Diagnosis not present

## 2021-07-20 DIAGNOSIS — R7401 Elevation of levels of liver transaminase levels: Secondary | ICD-10-CM | POA: Diagnosis not present

## 2021-07-20 DIAGNOSIS — H905 Unspecified sensorineural hearing loss: Secondary | ICD-10-CM | POA: Diagnosis not present

## 2021-07-20 DIAGNOSIS — Z23 Encounter for immunization: Secondary | ICD-10-CM

## 2021-07-20 MED ORDER — OXYCODONE HCL 5 MG PO TABS: ORAL_TABLET | ORAL | 0 refills | Status: DC

## 2021-07-20 MED ORDER — PREDNISONE 20 MG PO TABS: ORAL_TABLET | ORAL | 0 refills | Status: DC

## 2021-07-20 NOTE — Progress Notes (Signed)
OFFICE VISIT  07/20/2021  CC:  Chief Complaint  Patient presents with   Follow-up    RCI   HPI:    Patient is a 65 y.o. Caucasian female who presents for 3 mo f/u chronic pain syndrome, GAD, HTN, COPD, HLD, elevated LFTs. A/P as of last visit: "1) Chronic pain syndrome. Stable, cont oxycodone '5mg'$ , 1-2 tid prn, #180. I did electronic rx's today for each of the next 3 months.  Appropriate fill on/after date was noted on each rx. CSC UTD. UDS next f/u visit.   2) HTN: bp pretty near normal but now that she is over her acute illness we'll restart lisinopril '5mg'$  qd. BMET today.   3) HLD, mixed: has been off statin and fibrate for a few weeks d/t acute illness/dehyd. Her transaminases have been up and this was presumably d/t being on methotrexate.  She has been off methotrexate for a while now and we'll recheck hepatic panel today and consider restart statin and fibrate. FLP today.   4) Prediabetes: occ home bp check is pretty good. Diet is fair.  Activity level is low d/t chronic pain.   5) GAD: doing well long term on lorazepam '1mg'$  bid.  New rx not needed today. CSC UTD. UDS next visit.   6) Recent Pyelonephritis (LEFT)--resolved.   7) Chronic RUQ pain: w/u thus far has shown gastritis (h pylori NEG).  Has GI f/u for about 3 wks from now. Cont PPI bid for now.   8) Health maintenance exam: Reviewed age and gender appropriate health maintenance issues (prudent diet, regular exercise, health risks of tobacco and excessive alcohol, use of seatbelts, fire alarms in home, use of sunscreen).  Also reviewed age and gender appropriate health screening as well as vaccine recommendations. Vaccines: Prevnar 20 given today.  Shingrix discussed->#1 given today. Labs: cbc w/diff, cmet, lipid panel, Hba1c (prediab). Cervical ca screening: n/a->remote hx of hysterectomy for benign dx. Breast ca screening: no record of any mammograms in our EMR-->she wants to think about it. Colon ca  screening: has never had colonoscopy.  iFOB neg 06/2017->iFOB ordered today."  INTERIM HX: Doing "fair". Still on humira for inflamm arth but hydroxychloroquine stopped b/c she had tachycardia on this med.  L ear feels stopped up.  Onset 3 wks ago, feels full/dec hearing, very mild ringing L side only. Says R side hearing feels normal and no ringing.  No ear pain.  No vertigo.  No HAs.  RUQ pain and nausea not quite a prevalent but still bother her on and off, not particularly related to food ingestion.  Taking PPI bid and has helped minimally. Sometimes lasts 3-4 h, sometimes all day, sometimes goes a few days w/out prob.  HLD: last visit her transaminases were stable so I restarted her rosuvastatin but held off on restart of fibrate.  Chronic pain syndrome:  Her pain is unchanged, takes 6 oxy per day usually. Indication for chronic opioid: chronic bilat LBP w/out sciatica, and chronic pain from bilat osteoarthritis knees.   Medication and dose: oxycodone '5mg'$ , 1-2 tid prn. # pills per month: #180 PMP AWARE reviewed today: most recent rx for oxycodone was filled 06/27/21, # 180, rx by me. Most recent lorazepam rx filled 06/30/21, #60, rx by me. No red flags.  COPD: breathing stable.  Compliant with albuterol on prn basis.  ROS as above, plus--> no fevers, no CP, no SOB, no wheezing, no cough, no rashes, no melena/hematochezia.  No polyuria or polydipsia.  No focal weakness, paresthesias,  or tremors.  No acute vision changes.  No dysuria or unusual/new urinary urgency or frequency.  No recent changes in lower legs. No diarrhea. No palpitations.     Past Medical History:  Diagnosis Date   Aortic valve stenosis    "very mild"   Atypical chest pain 2007; 2015   cardiolyte neg, echo nl, cath showed mild/nonobstructive LAD disease   Bilateral primary osteoarthritis of knee 06/30/2012   Right >L.  Right unicompartmental knee arthroplasty 10/25/18   CAD (coronary artery disease)    Chronic  LBP    Multiple surgeries   COPD (chronic obstructive pulmonary disease) ([place])    COVID-19 virus infection 06/02/2019   Eval at [facility] ED and d/c'd home.  Admitted 06/12/19 with hypoxic RF and bilat infiltrates.   DDD (degenerative disc disease)    spinal stenosis   Elevated liver enzymes    2021 after starting methotrexate. 2022->rheum d/c'd her methotrexate   Her abd u/s 12/05/20 showed fatty liver, o/w normal.   Fatty liver    11/2020 liver u/s   Gastritis    H pylori NEG on EGD 02/2021   GERD (gastroesophageal reflux disease)    History of adenomatous polyp of colon 06/2021   2022, recall 5 yrs   History of GI bleed    NSAIDS   Hyperlipidemia    mixed   Microscopic hematuria    full w/ u unrevealing X 2   Normal nuclear stress test 11/11 and 06/2014   negative, EF normal   Palpitations 2006   48H holter neg   Positive occult stool blood test    iFOB positive 04/2021->colonoscopy showed multiple adenomatous polyps and nonbleeding int hem.   Prediabetes    Highest A1c 6.1% as of 03/2017   RBBB (right bundle branch block)    Recurrent UTI    Seronegative rheumatoid arthritis ([place]) 10/2019   multiple sites->pred started, methotrex started 10/2019; rheum to add humira as of 03/2020   Tobacco dependence in remission    Quit fall 2015.      Past Surgical History:  Procedure Laterality Date   ABDOMINAL HYSTERECTOMY  1997   DUB   APPENDECTOMY  1984   BIOPSY  02/14/2021   Procedure: BIOPSY;  Surgeon: Eloise Harman, DO;  Location: [facility];  Service: Endoscopy;;   CARDIAC CATHETERIZATION  10/09/2005   no CAD, mildly elevated LVEDP, normal LV function (Dr. Gerrie Nordmann)   CARDIAC CATHETERIZATION N/A 05/16/2015   Mild non-obstructive CAD--med mgmt.  Procedure: Left Heart Cath and Coronary Angiography;  Surgeon: Pixie Casino, MD;  Location: [facility];  Service: Cardiovascular;  Laterality: N/A;   CARDIOVASCULAR STRESS TEST  06/2014   normal lexiscan NST    CARDIOVASCULAR STRESS TEST  2006   persantine - no ischemia, low risk    COLONOSCOPY W/ POLYPECTOMY  06/12/2021   multiple adenomas--recall 3-5 yrs   COLONOSCOPY WITH PROPOFOL N/A 06/12/2021   Procedure: COLONOSCOPY WITH PROPOFOL;  Surgeon: Eloise Harman, DO;  Location: [facility];  Service: Endoscopy;  Laterality: N/A;  8:30am   ESOPHAGOGASTRODUODENOSCOPY  02/14/2021   Gastritis ( H pylori NEG) w/out bleeding   ESOPHAGOGASTRODUODENOSCOPY (EGD) WITH PROPOFOL N/A 02/14/2021   Procedure: ESOPHAGOGASTRODUODENOSCOPY (EGD) WITH PROPOFOL;  Surgeon: Eloise Harman, DO;  Location: [facility];  Service: Endoscopy;  Laterality: N/A;  AM   KNEE ARTHROSCOPY Bilateral    left wrist ganglion cyst excision  2010   [place]  right iliac crest bone graft+metal instrumentation; 2005 metal removal and decompression, 2011 lumbar decompression 4-11, then stabilization/ instrumentation done 09-19-10: L2,L3, L5 left and L2 , L3, L4 right pedical remnant L4 left embedded. Left iliac crest bone graft-- Dr Zollie Pee SPINE SURGERY  02/10/2016   Dr. Velna Ochs: lumbar decompression, instrumentation removal, and fusion exploration--HELPED her a lot, esp her radicular leg pains.   OOPHORECTOMY Right    cyst   PARTIAL KNEE ARTHROPLASTY Right 11/04/2018   Procedure: UNICOMPARTMENTAL KNEE;  Surgeon: Renette Butters, MD;  Location: [facility];  Service: Orthopedics;  Laterality: Right;  Adductor Block   POLYPECTOMY  06/12/2021   Procedure: POLYPECTOMY;  Surgeon: Eloise Harman, DO;  Location: [facility];  Service: Endoscopy;;   TONSILLECTOMY  65 yrs old   TRANSTHORACIC ECHOCARDIOGRAM  2006   EF=>55%, trace MR, mild TR, trace AV regurg, trace pulm valve regurg     Outpatient Medications Prior to Visit  Medication Sig Dispense Refill   acetaminophen (TYLENOL) 500 MG tablet Take 500 mg by mouth every 6 (six) hours as needed for fever.     Adalimumab (HUMIRA) 40 MG/0.8ML PSKT Inject 40 mg  into the skin every 14 (fourteen) days.     Calcium Carb-Cholecalciferol (CALCIUM/VITAMIN D PO) Take 1 tablet by mouth daily.     clopidogrel (PLAVIX) 75 MG tablet TAKE 1 TABLET(75 MG) BY MOUTH DAILY (Patient taking differently: Take 75 mg by mouth daily.) 90 tablet 3   cyclobenzaprine (FLEXERIL) 10 MG tablet TAKE 1 TABLET BY MOUTH EVERY 8 HOURS AS NEEDED (Patient taking differently: Take 10 mg by mouth every 8 (eight) hours as needed for muscle spasms.) 90 tablet 3   fenofibrate 160 MG tablet TAKE 1 TABLET BY MOUTH EVERY DAY WITH FOOD (Patient taking differently: Take 160 mg by mouth daily.) 90 tablet 3   folic acid (FOLVITE) 1 MG tablet Take 1 mg by mouth daily.      lisinopril (ZESTRIL) 5 MG tablet TAKE 1 TABLET(5 MG) BY MOUTH DAILY (Patient taking differently: Take 5 mg by mouth daily.) 90 tablet 1   LORazepam (ATIVAN) 1 MG tablet Take 1 tablet (1 mg total) by mouth 2 (two) times daily. 60 tablet 5   pantoprazole (PROTONIX) 40 MG tablet Take 1 tablet (40 mg total) by mouth 2 (two) times daily. 60 tablet 5   promethazine (PHENERGAN) 12.5 MG tablet 1-2 tabs po q6h prn nausea 45 tablet 1   oxyCODONE (OXY IR/ROXICODONE) 5 MG immediate release tablet 1-2 po tid prn pain (Patient taking differently: Take 5-10 mg by mouth every 4 (four) hours as needed for moderate pain.) 180 tablet 0   albuterol (VENTOLIN HFA) 108 (90 Base) MCG/ACT inhaler Inhale 2 puffs into the lungs every 4 (four) hours as needed for wheezing or shortness of breath (bronchitis). Reported on 03/12/2016 (Patient not taking: Reported on 07/20/2021) 18 g 1   nitroGLYCERIN (NITROSTAT) 0.4 MG SL tablet Place 1 tablet (0.4 mg total) under the tongue every 5 (five) minutes as needed for chest pain (MAX 3 doses.). (Patient not taking: Reported on 07/20/2021) 10 tablet 1   rosuvastatin (CRESTOR) 40 MG tablet TAKE 1 TABLET(40 MG) BY MOUTH DAILY (Patient not taking: Reported on 07/20/2021) 90 tablet 3   polyethylene glycol-electrolytes (TRILYTE) 420 g  solution Take 4,000 mLs by mouth as directed. (Patient not taking: Reported on 07/20/2021) 4000 mL 0   No facility-administered medications prior to visit.    Allergies  Allergen Reactions   Penicillins  Anaphylaxis and Swelling    DID THE REACTION INVOLVE: Swelling of the face/tongue/throat, SOB, or low BP? Yes Sudden or severe rash/hives, skin peeling, or the inside of the mouth or nose? Yes Did it require medical treatment? Yes When did it last happen? 65 years old If all above answers are "NO", may proceed with cephalosporin use.    Aspirin Other (See Comments)     GI Bleed   Beta Adrenergic Blockers Other (See Comments)    REACTION: decreased libido   Citalopram Nausea And Vomiting   Nsaids Other (See Comments)     GI Upset   Other Hives and Itching    Walnuts    Plaquenil [Hydroxychloroquine Sulfate] Other (See Comments)    Tachycardia   Prochlorperazine Edisylate Other (See Comments)     Stroke like symptoms   Sulfonamide Derivatives Other (See Comments)    Unknown allergic reaction   Amitriptyline Hcl Palpitations and Other (See Comments)     increased heart rate    ROS As per HPI  PE: Vitals with BMI 07/20/2021 06/12/2021 06/12/2021  Height '5\' 3"'$  - -  Weight 207 lbs 6 oz - -  BMI [id number] - -  Systolic [id number]  Diastolic 66 72 75  Pulse 89 - 80   Gen: Alert, well appearing.  Patient is oriented to person, place, time, and situation. AFFECT: pleasant, lucid thought and speech. Grossly dec hearing L ear. EACs clear, TMs w/out signif abnormality. Weber: lateralized to R ear ("good ear"). Rinne: A>B R ear but B>A left ear.   LABS:  Lab Results  Component Value Date   TSH 0.42 09/24/2019   Lab Results  Component Value Date   WBC 10.5 04/19/2021   HGB 14.5 04/19/2021   HCT 43.2 04/19/2021   MCV 95.3 04/19/2021   PLT 154.0 04/19/2021   Lab Results  Component Value Date   CREATININE 0.62 04/19/2021   BUN 12 04/19/2021   NA 143 04/19/2021   K 3.8  04/19/2021   CL 103 04/19/2021   CO2 30 04/19/2021   Lab Results  Component Value Date   ALT 47 (H) 04/19/2021   AST 48 (H) 04/19/2021   ALKPHOS 71 04/19/2021   BILITOT 1.3 (H) 04/19/2021   Lab Results  Component Value Date   CHOL 212 (H) 04/19/2021   Lab Results  Component Value Date   HDL 57.30 04/19/2021   Lab Results  Component Value Date   LDLCALC 132 (H) 04/19/2021   Lab Results  Component Value Date   TRIG 113.0 04/19/2021   Lab Results  Component Value Date   CHOLHDL 4 04/19/2021   Lab Results  Component Value Date   HGBA1C 5.8 04/19/2021   Audiometry today: R ear required 40-50 db in all frequencies (500, 1000, 2000, and 4000).  L ear same.--->pt then stated she actually did not hear ANYTHING in L ear!  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 Unfortunately she has had this for 3 wks now.  Will do prednisone '60mg'$  qd x 7d and refer to ENT.  2) HTN: stable.  Cont lisinopril 5 qd. Lytes/cr today.  3) HLD: restarted rosuvastatin 3 mo ago, check lipids and hepatic panel today. May be able to restart fenofibrate depending on results.  4) Chronic RUQ pain: w/u thus far has shown gastritis (h pylori NEG).  Colonoscopy showed no abnormality to explain this.  This is occurring less frequently. Cont PPI bid for now. Monitor  hepatic panel today. Consider hepatobiliary scint w/eject fraction in future if persistent worsening.  5) Chronic pain syndrome: bilat severe osteoarthritis knees. Stable. I did electronic rx's for her oxycodone today for this month, Oct, and Nov this year.  Appropriate fill on/after date was noted on each rx.  6) COPD: stable. Albut prn.  An After Visit Summary was printed and given to the patient.  FOLLOW UP: Return in about 3 months (around 10/19/2021) for routine chronic illness f/u. Cpe 04/2022  Signed:  Crissie Sickles, MD           07/20/2021

## 2021-07-21 LAB — LIPID PANEL
Cholesterol: 199 mg/dL (ref 0–200)
HDL: 49.1 mg/dL (ref >39.00)
LDL Cholesterol: 118 mg/dL — ABNORMAL HIGH (ref 0–99)
NonHDL: 149.53
Total CHOL/HDL Ratio: 4
Triglycerides: 157 mg/dL — ABNORMAL HIGH (ref 0.0–149.0)
VLDL: 31.4 mg/dL (ref 0.0–40.0)

## 2021-07-21 LAB — COMPREHENSIVE METABOLIC PANEL
ALT: 40 U/L — ABNORMAL HIGH (ref 0–35)
AST: 43 U/L — ABNORMAL HIGH (ref 0–37)
Albumin: 4.2 g/dL (ref 3.5–5.2)
Alkaline Phosphatase: 69 U/L (ref 39–117)
BUN: 8 mg/dL (ref 6–23)
CO2: 30 mEq/L (ref 19–32)
Calcium: 10.1 mg/dL (ref 8.4–10.5)
Chloride: 101 mEq/L (ref 96–112)
Creatinine, Ser: 0.71 mg/dL (ref 0.40–1.20)
GFR: 89.42 mL/min (ref >60.00)
Glucose, Bld: 94 mg/dL (ref 70–99)
Potassium: 4.2 mEq/L (ref 3.5–5.1)
Sodium: 142 mEq/L (ref 135–145)
Total Bilirubin: 0.8 mg/dL (ref 0.2–1.2)
Total Protein: 7 g/dL (ref 6.0–8.3)

## 2021-08-16 ENCOUNTER — Ambulatory Visit: Payer: Medicare Other

## 2021-09-06 ENCOUNTER — Other Ambulatory Visit: Payer: Self-pay | Admitting: Family Medicine

## 2021-09-11 ENCOUNTER — Encounter: Payer: Self-pay | Admitting: Internal Medicine

## 2021-09-13 ENCOUNTER — Other Ambulatory Visit: Payer: Self-pay | Admitting: Family Medicine

## 2021-09-13 DIAGNOSIS — Z79899 Other long term (current) drug therapy: Secondary | ICD-10-CM | POA: Diagnosis not present

## 2021-09-13 DIAGNOSIS — M79643 Pain in unspecified hand: Secondary | ICD-10-CM | POA: Diagnosis not present

## 2021-09-13 DIAGNOSIS — M79673 Pain in unspecified foot: Secondary | ICD-10-CM | POA: Diagnosis not present

## 2021-09-13 DIAGNOSIS — M0609 Rheumatoid arthritis without rheumatoid factor, multiple sites: Secondary | ICD-10-CM | POA: Diagnosis not present

## 2021-09-13 DIAGNOSIS — M1990 Unspecified osteoarthritis, unspecified site: Secondary | ICD-10-CM | POA: Diagnosis not present

## 2021-09-13 DIAGNOSIS — E669 Obesity, unspecified: Secondary | ICD-10-CM | POA: Diagnosis not present

## 2021-09-13 DIAGNOSIS — I2510 Atherosclerotic heart disease of native coronary artery without angina pectoris: Secondary | ICD-10-CM | POA: Diagnosis not present

## 2021-09-13 DIAGNOSIS — J449 Chronic obstructive pulmonary disease, unspecified: Secondary | ICD-10-CM | POA: Diagnosis not present

## 2021-09-13 DIAGNOSIS — M549 Dorsalgia, unspecified: Secondary | ICD-10-CM | POA: Diagnosis not present

## 2021-09-13 LAB — CBC AND DIFFERENTIAL
HCT: 42 (ref 36–46)
Hemoglobin: 13.8 (ref 12.0–16.0)
Neutrophils Absolute: 4.1
Platelets: 147 — AB (ref 150–399)
WBC: 7.7

## 2021-09-13 LAB — BASIC METABOLIC PANEL
BUN: 6 (ref 4–21)
CO2: 33 — AB (ref 13–22)
Chloride: 105 (ref 99–108)
Creatinine: 0.7 (ref 0.5–1.1)
Glucose: 97
Potassium: 4.4 (ref 3.4–5.3)
Sodium: 144 (ref 137–147)

## 2021-09-13 LAB — COMPREHENSIVE METABOLIC PANEL
Albumin: 3.6 (ref 3.5–5.0)
Calcium: 9.9 (ref 8.7–10.7)
GFR calc Af Amer: 107
GFR calc non Af Amer: 93
Globulin: 3.4

## 2021-09-13 LAB — HEPATIC FUNCTION PANEL
ALT: 69 — AB (ref 7–35)
AST: 49 — AB (ref 13–35)
Alkaline Phosphatase: 81 (ref 25–125)
Bilirubin, Total: 0.5

## 2021-09-13 LAB — CBC: RBC: 4.35 (ref 3.87–5.11)

## 2021-09-27 ENCOUNTER — Telehealth: Payer: Self-pay

## 2021-09-27 NOTE — Telephone Encounter (Signed)
Called pt, OV scheduled for 11/01/21 at 9:30am.   Erline Levine, disregard previous message.

## 2021-09-27 NOTE — Telephone Encounter (Signed)
Pt called office, received letter she's due for f/u. Wants to see Dr. Abbey Chatters. Please schedule with Dr. Abbey Chatters when his next schedule is available.

## 2021-09-30 ENCOUNTER — Other Ambulatory Visit: Payer: Self-pay | Admitting: Family Medicine

## 2021-10-03 NOTE — Telephone Encounter (Signed)
Requesting: lorazepam Contract: 01/16/21 UDS: 12/28/19 Last Visit:07/20/21 Next Visit: 10/19/21 Last Refill: 03/29/21(60,5)  Please Advise. Med pending

## 2021-10-04 NOTE — Telephone Encounter (Signed)
Patient called to say she has been out of this medication since Sat 11/26.  She stated pharmacy told her Dr. Anitra Lauth did not approve refill.  I told patient that he has not approved or denied fill, refill request has been sent to him late yesterday afternoon and has not had a chance to review your chart. I told patient someone would follow up with her once Dr. Anitra Lauth reviews.   She stated has appt scheduled to see for 12/15

## 2021-10-04 NOTE — Telephone Encounter (Signed)
Pt advised refill sent. °

## 2021-10-19 ENCOUNTER — Ambulatory Visit: Payer: Medicare HMO | Admitting: Family Medicine

## 2021-10-19 ENCOUNTER — Encounter: Payer: Self-pay | Admitting: Family Medicine

## 2021-10-19 ENCOUNTER — Other Ambulatory Visit: Payer: Self-pay

## 2021-10-19 VITALS — BP 110/70 | HR 101 | Temp 98.3°F | Ht 63.0 in | Wt 204.0 lb

## 2021-10-19 DIAGNOSIS — G8929 Other chronic pain: Secondary | ICD-10-CM | POA: Diagnosis not present

## 2021-10-19 DIAGNOSIS — R7401 Elevation of levels of liver transaminase levels: Secondary | ICD-10-CM | POA: Diagnosis not present

## 2021-10-19 DIAGNOSIS — F411 Generalized anxiety disorder: Secondary | ICD-10-CM | POA: Diagnosis not present

## 2021-10-19 DIAGNOSIS — I10 Essential (primary) hypertension: Secondary | ICD-10-CM

## 2021-10-19 DIAGNOSIS — M25561 Pain in right knee: Secondary | ICD-10-CM | POA: Diagnosis not present

## 2021-10-19 DIAGNOSIS — M170 Bilateral primary osteoarthritis of knee: Secondary | ICD-10-CM | POA: Diagnosis not present

## 2021-10-19 DIAGNOSIS — G894 Chronic pain syndrome: Secondary | ICD-10-CM

## 2021-10-19 DIAGNOSIS — E782 Mixed hyperlipidemia: Secondary | ICD-10-CM | POA: Diagnosis not present

## 2021-10-19 DIAGNOSIS — Z79899 Other long term (current) drug therapy: Secondary | ICD-10-CM | POA: Diagnosis not present

## 2021-10-19 DIAGNOSIS — M25562 Pain in left knee: Secondary | ICD-10-CM

## 2021-10-19 MED ORDER — LISINOPRIL 5 MG PO TABS: ORAL_TABLET | ORAL | 3 refills | Status: DC

## 2021-10-19 MED ORDER — OXYCODONE HCL 5 MG PO TABS: ORAL_TABLET | ORAL | 0 refills | Status: DC

## 2021-10-19 MED ORDER — PANTOPRAZOLE SODIUM 40 MG PO TBEC: 40.0000 mg | DELAYED_RELEASE_TABLET | Freq: Two times a day (BID) | ORAL | 5 refills | Status: DC

## 2021-10-19 MED ORDER — ALBUTEROL SULFATE HFA 108 (90 BASE) MCG/ACT IN AERS: 2.0000 | INHALATION_SPRAY | RESPIRATORY_TRACT | 1 refills | Status: AC | PRN

## 2021-10-19 MED ORDER — PROMETHAZINE HCL 12.5 MG PO TABS: ORAL_TABLET | ORAL | 2 refills | Status: DC

## 2021-10-19 NOTE — Progress Notes (Signed)
OFFICE VISIT  10/19/2021  CC:  Chief Complaint  Patient presents with   Follow-up    RCI; fasting    HPI:    Patient is a 65 y.o. female who presents for 3 mo f/u chronic pain syndrome, HTN, chronic anxiety, HLD, and prediabetes. 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 Unfortunately she has had this for 3 wks now.  Will do prednisone 60mg  qd x 7d and refer to ENT.   2) HTN: stable.  Cont lisinopril 5 qd. Lytes/cr today.   3) HLD: restarted rosuvastatin 3 mo ago, check lipids and hepatic panel today. May be able to restart fenofibrate depending on results.   4) Chronic RUQ pain: w/u thus far has shown gastritis (h pylori NEG).  Colonoscopy showed no abnormality to explain this.  This is occurring less frequently. Cont PPI bid for now. Monitor hepatic panel today. Consider hepatobiliary scint w/eject fraction in future if persistent worsening.   5) Chronic pain syndrome: bilat severe osteoarthritis knees. Stable. I did electronic rx's for her oxycodone today for this month, Oct, and Nov this year.  Appropriate fill on/after date was noted on each rx.   6) COPD: stable. Albut prn."  INTERIM HX: Labs all stable last visit. Recent labs with Rheum 09/13/21---LFTs stable. She is back on rosuvastatin and fenofibrate since I last saw her.  She felt no improvement with the prednisone for her hearing.  She never got a call from ENT.  Woke up with nausea today, feels her typical bloating that we have been working her up for.  However she only has this every couple of weeks now.  Still seems mostly random.  Takes Phenergan and this does help some.  She has been out of this medicine.  Home blood pressure is normal.   Chronic pain syndrome: Indication for chronic opioid: chronic bilat LBP w/out sciatica, and chronic pain from bilat osteoarthritis knees.  Also LBP and bilat shoulder pain.  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  She remains on Humira and is doing well on this.  She still takes 1-2 oxycodone 3 times a day. Medication and dose: oxycodone 5mg , 1-2 tid prn. # pills per month: #180. PMP AWARE reviewed today: most recent rx for oxycodone was filled 09/30/21, # 120, rx by me.  She has been maintained long-term on lorazepam for anxiety, unchanged recently. Most recent lorazepam rx filled 10/04/21, #60, rx by me. No red flags.  ROS as above, plus--> no fevers, no CP, no SOB, no wheezing, no cough, no dizziness, no HAs, no rashes, no melena/hematochezia.  No polyuria or polydipsia.   No focal weakness, paresthesias, or tremors.  No acute vision or hearing abnormalities.  No dysuria or unusual/new urinary urgency or frequency.  No recent changes in lower legs. No diarrhea.  No palpitations.    Past Medical History:  Diagnosis Date   Aortic valve stenosis    "very mild"   Atypical chest pain 2007; 2015   cardiolyte neg, echo nl, cath showed mild/nonobstructive LAD disease   Bilateral primary osteoarthritis of knee 06/30/2012   Right >L.  Right unicompartmental knee arthroplasty 10/25/18   CAD (coronary artery disease)    Chronic LBP    Multiple surgeries   COPD (chronic obstructive pulmonary disease) ([place])    COVID-19 virus infection 06/02/2019   Eval at [facility] ED and d/c'd home.  Admitted 06/12/19 with hypoxic RF and bilat infiltrates.   DDD (  degenerative disc disease)    spinal stenosis   Elevated liver enzymes    2021 after starting methotrexate. 2022->rheum d/c'd her methotrexate   Her abd u/s 12/05/20 showed fatty liver, o/w normal.   Fatty liver    11/2020 liver u/s   Gastritis    H pylori NEG on EGD 02/2021   GERD (gastroesophageal reflux disease)    History of adenomatous polyp of colon 06/2021   2022, recall 5 yrs   History of GI bleed    NSAIDS   Hyperlipidemia    mixed   Microscopic hematuria    full w/ u unrevealing X 2   Normal nuclear stress test 11/11 and 06/2014    negative, EF normal   Palpitations 2006   48H holter neg   Positive occult stool blood test    iFOB positive 04/2021->colonoscopy showed multiple adenomatous polyps and nonbleeding int hem.   Prediabetes    Highest A1c 6.1% as of 03/2017   RBBB (right bundle branch block)    Recurrent UTI    Seronegative rheumatoid arthritis ([place]) 10/2019   multiple sites->pred started, methotrex started 10/2019; rheum to add humira as of 03/2020   Tobacco dependence in remission    Quit fall 2015.      Past Surgical History:  Procedure Laterality Date   ABDOMINAL HYSTERECTOMY  1997   DUB   APPENDECTOMY  1984   BIOPSY  02/14/2021   Procedure: BIOPSY;  Surgeon: Eloise Harman, DO;  Location: [facility];  Service: Endoscopy;;   CARDIAC CATHETERIZATION  10/09/2005   no CAD, mildly elevated LVEDP, normal LV function (Dr. Gerrie Nordmann)   CARDIAC CATHETERIZATION N/A 05/16/2015   Mild non-obstructive CAD--med mgmt.  Procedure: Left Heart Cath and Coronary Angiography;  Surgeon: Pixie Casino, MD;  Location: [facility];  Service: Cardiovascular;  Laterality: N/A;   CARDIOVASCULAR STRESS TEST  06/2014   normal lexiscan NST   CARDIOVASCULAR STRESS TEST  2006   persantine - no ischemia, low risk    COLONOSCOPY W/ POLYPECTOMY  06/12/2021   multiple adenomas--recall 3-5 yrs   COLONOSCOPY WITH PROPOFOL N/A 06/12/2021   Procedure: COLONOSCOPY WITH PROPOFOL;  Surgeon: Eloise Harman, DO;  Location: [facility];  Service: Endoscopy;  Laterality: N/A;  8:30am   ESOPHAGOGASTRODUODENOSCOPY  02/14/2021   Gastritis ( H pylori NEG) w/out bleeding   ESOPHAGOGASTRODUODENOSCOPY (EGD) WITH PROPOFOL N/A 02/14/2021   Procedure: ESOPHAGOGASTRODUODENOSCOPY (EGD) WITH PROPOFOL;  Surgeon: Eloise Harman, DO;  Location: [facility];  Service: Endoscopy;  Laterality: N/A;  AM   KNEE ARTHROSCOPY Bilateral    left wrist ganglion cyst excision  2010   [place]   right iliac crest bone  graft+metal instrumentation; 2005 metal removal and decompression, 2011 lumbar decompression 4-11, then stabilization/ instrumentation done 09-19-10: L2,L3, L5 left and L2 , L3, L4 right pedical remnant L4 left embedded. Left iliac crest bone graft-- Dr Zollie Pee SPINE SURGERY  02/10/2016   Dr. Velna Ochs: lumbar decompression, instrumentation removal, and fusion exploration--HELPED her a lot, esp her radicular leg pains.   OOPHORECTOMY Right    cyst   PARTIAL KNEE ARTHROPLASTY Right 11/04/2018   Procedure: UNICOMPARTMENTAL KNEE;  Surgeon: Renette Butters, MD;  Location: [facility];  Service: Orthopedics;  Laterality: Right;  Adductor Block   POLYPECTOMY  06/12/2021   Procedure: POLYPECTOMY;  Surgeon: Eloise Harman, DO;  Location: [facility];  Service: Endoscopy;;   TONSILLECTOMY  65 yrs  old   TRANSTHORACIC ECHOCARDIOGRAM  2006   EF=>55%, trace MR, mild TR, trace AV regurg, trace pulm valve regurg     Outpatient Medications Prior to Visit  Medication Sig Dispense Refill   acetaminophen (TYLENOL) 500 MG tablet Take 500 mg by mouth every 6 (six) hours as needed for fever.     Adalimumab (HUMIRA) 40 MG/0.8ML PSKT Inject 40 mg into the skin every 14 (fourteen) days.     Calcium Carb-Cholecalciferol (CALCIUM/VITAMIN D PO) Take 1 tablet by mouth daily.     clopidogrel (PLAVIX) 75 MG tablet TAKE 1 TABLET(75 MG) BY MOUTH DAILY 90 tablet 1   cyclobenzaprine (FLEXERIL) 10 MG tablet TAKE 1 TABLET BY MOUTH EVERY 8 HOURS AS NEEDED 90 tablet 1   fenofibrate 160 MG tablet TAKE 1 TABLET BY MOUTH EVERY DAY WITH FOOD 90 tablet 1   folic acid (FOLVITE) 1 MG tablet Take 1 mg by mouth daily.      LORazepam (ATIVAN) 1 MG tablet TAKE 1 TABLET(1 MG) BY MOUTH TWICE DAILY 60 tablet 5   rosuvastatin (CRESTOR) 40 MG tablet TAKE 1 TABLET(40 MG) BY MOUTH DAILY 90 tablet 1   albuterol (VENTOLIN HFA) 108 (90 Base) MCG/ACT inhaler Inhale 2 puffs into the lungs every 4 (four) hours as needed for wheezing or shortness of  breath (bronchitis). Reported on 03/12/2016 18 g 1   lisinopril (ZESTRIL) 5 MG tablet TAKE 1 TABLET(5 MG) BY MOUTH DAILY (Patient taking differently: Take 5 mg by mouth daily.) 90 tablet 1   oxyCODONE (OXY IR/ROXICODONE) 5 MG immediate release tablet 1-2 po tid prn pain 180 tablet 0   predniSONE (DELTASONE) 20 MG tablet 3 tabs po qd x 7d 21 tablet 0   promethazine (PHENERGAN) 12.5 MG tablet 1-2 tabs po q6h prn nausea 45 tablet 1   nitroGLYCERIN (NITROSTAT) 0.4 MG SL tablet Place 1 tablet (0.4 mg total) under the tongue every 5 (five) minutes as needed for chest pain (MAX 3 doses.). (Patient not taking: Reported on 07/20/2021) 10 tablet 1   pantoprazole (PROTONIX) 40 MG tablet Take 1 tablet (40 mg total) by mouth 2 (two) times daily. 60 tablet 5   No facility-administered medications prior to visit.    Allergies  Allergen Reactions   Penicillins Anaphylaxis and Swelling    DID THE REACTION INVOLVE: Swelling of the face/tongue/throat, SOB, or low BP? Yes Sudden or severe rash/hives, skin peeling, or the inside of the mouth or nose? Yes Did it require medical treatment? Yes When did it last happen? 65 years old If all above answers are "NO", may proceed with cephalosporin use.    Aspirin Other (See Comments)     GI Bleed   Beta Adrenergic Blockers Other (See Comments)    REACTION: decreased libido   Citalopram Nausea And Vomiting   Nsaids Other (See Comments)     GI Upset   Other Hives and Itching    Walnuts    Plaquenil [Hydroxychloroquine Sulfate] Other (See Comments)    Tachycardia   Prochlorperazine Edisylate Other (See Comments)     Stroke like symptoms   Sulfonamide Derivatives Other (See Comments)    Unknown allergic reaction   Amitriptyline Hcl Palpitations and Other (See Comments)     increased heart rate    ROS As per HPI  PE: Vitals with BMI 10/19/2021 07/20/2021 06/12/2021  Height 5\' 3"  5\' 3"  -  Weight 204 lbs 207 lbs 6 oz -  BMI 16.10 96.04 -  Systolic 540 981 191  Diastolic 70 66 72  Pulse 175 89 -     Physical Exam  Vitals with BMI 10/19/2021 07/20/2021 06/12/2021  Height 5\' 3"  5\' 3"  -  Weight 204 lbs 207 lbs 6 oz -  BMI 10.25 85.27 -  Systolic 782 423 536  Diastolic 70 66 72  Pulse 144 89 -   Gen: Alert, well appearing.  Patient is oriented to person, place, time, and situation. AFFECT: pleasant, lucid thought and speech. CV: RRR, no m/r/g.   LUNGS: CTA bilat, nonlabored resps, good aeration in all lung fields. EXT: no clubbing or cyanosis.  no edema.    LABS:  Last CBC Lab Results  Component Value Date   WBC 7.7 09/13/2021   HGB 13.8 09/13/2021   HCT 42 09/13/2021   MCV 95.3 04/19/2021   MCH 29.1 06/18/2019   RDW 14.4 04/19/2021   PLT 147 (A) 09/13/2021   Lab Results  Component Value Date   ESRSEDRATE 7 31/54/0086   Last metabolic panel Lab Results  Component Value Date   GLUCOSE 94 07/20/2021   NA 144 09/13/2021   K 4.4 09/13/2021   CL 105 09/13/2021   CO2 33 (A) 09/13/2021   BUN 6 09/13/2021   CREATININE 0.7 09/13/2021   GFRNONAA 93 09/13/2021   CALCIUM 9.9 09/13/2021   PROT 7.0 07/20/2021   ALBUMIN 3.6 09/13/2021   LABGLOB 2.5 01/05/2021   AGRATIO 1.8 01/05/2021   BILITOT 0.8 07/20/2021   ALKPHOS 81 09/13/2021   AST 49 (A) 09/13/2021   ALT 69 (A) 09/13/2021   ANIONGAP 11 02/14/2021   Last lipids Lab Results  Component Value Date   CHOL 199 07/20/2021   HDL 49.10 07/20/2021   LDLCALC 118 (H) 07/20/2021   LDLDIRECT 172.2 07/14/2014   TRIG 157.0 (H) 07/20/2021   CHOLHDL 4 07/20/2021   Last hemoglobin A1c Lab Results  Component Value Date   HGBA1C 5.8 04/19/2021   Last thyroid functions Lab Results  Component Value Date   TSH 0.42 09/24/2019   IMPRESSION AND PLAN:  1 left ear hearing loss.  Not significantly changed, prednisone no help.Bonne Dolores to get ENT referral but apparently she never got called.  She wants to just watch this for now.  2.  Hypertension, well controlled on lisinopril 5 mg  a day.  Recent electrolytes and creatinine at rheumatology were normal.  3.  Hyperlipidemia, mixed.  Continue rosuvastatin and fenofibrate, monitoring LFTs closely most recently about a month ago these were stable--AST 49, ALT 69. Repeat lipid panel next office visit 3 months  #4 chronic pain syndrome.  Stable, continue oxycodone 5 mg, 1-2 3 times daily as needed. Controlled substance contract up-to-date.  Urine drug screen today.  #5 Chronic RUQ pain: w/u thus far has shown gastritis (h pylori NEG).  Colonoscopy showed no abnormality to explain this.  This is occurring less frequently. Cont PPI bid for now.  #6 elevated transaminases.  She does have fatty liver additionally, it seemed that these were tied to being on methotrexate.  However getting off methotrexate has not resulted in complete return to normal.  She said her gastroenterologist told her he is considering a liver biopsy.  An After Visit Summary was printed and given to the patient.  FOLLOW UP: Return in about 3 months (around 01/17/2022) for annual CPE (fasting).  Signed:  Crissie Sickles, MD           10/19/2021

## 2021-10-20 ENCOUNTER — Other Ambulatory Visit: Payer: Self-pay | Admitting: Family Medicine

## 2021-10-22 LAB — DRUG MONITORING PANEL 376104, URINE
Alphahydroxyalprazolam: NEGATIVE ng/mL (ref <25)
Alphahydroxymidazolam: NEGATIVE ng/mL (ref <50)
Alphahydroxytriazolam: NEGATIVE ng/mL (ref <50)
Aminoclonazepam: NEGATIVE ng/mL (ref <25)
Amphetamines: NEGATIVE ng/mL (ref <500)
Barbiturates: NEGATIVE ng/mL (ref <300)
Benzodiazepines: POSITIVE ng/mL — AB (ref <100)
Cocaine Metabolite: NEGATIVE ng/mL (ref <150)
Codeine: NEGATIVE ng/mL (ref <50)
Desmethyltramadol: NEGATIVE ng/mL (ref <100)
Hydrocodone: NEGATIVE ng/mL (ref <50)
Hydromorphone: NEGATIVE ng/mL (ref <50)
Hydroxyethylflurazepam: NEGATIVE ng/mL (ref <50)
Lorazepam: 532 ng/mL — ABNORMAL HIGH (ref <50)
Morphine: NEGATIVE ng/mL (ref <50)
Nordiazepam: NEGATIVE ng/mL (ref <50)
Norhydrocodone: NEGATIVE ng/mL (ref <50)
Noroxycodone: 1314 ng/mL — ABNORMAL HIGH (ref <50)
Opiates: NEGATIVE ng/mL (ref <100)
Oxazepam: NEGATIVE ng/mL (ref <50)
Oxycodone: 3596 ng/mL — ABNORMAL HIGH (ref <50)
Oxycodone: POSITIVE ng/mL — AB (ref <100)
Oxymorphone: 2929 ng/mL — ABNORMAL HIGH (ref <50)
Temazepam: NEGATIVE ng/mL (ref <50)
Tramadol: NEGATIVE ng/mL (ref <100)

## 2021-10-22 LAB — DM TEMPLATE

## 2021-11-01 ENCOUNTER — Encounter: Payer: Self-pay | Admitting: Internal Medicine

## 2021-11-01 ENCOUNTER — Other Ambulatory Visit: Payer: Self-pay

## 2021-11-01 ENCOUNTER — Ambulatory Visit (INDEPENDENT_AMBULATORY_CARE_PROVIDER_SITE_OTHER): Payer: Medicare HMO | Admitting: Internal Medicine

## 2021-11-01 VITALS — BP 141/76 | HR 94 | Temp 97.1°F | Ht 63.0 in | Wt 206.2 lb

## 2021-11-01 DIAGNOSIS — G8929 Other chronic pain: Secondary | ICD-10-CM | POA: Diagnosis not present

## 2021-11-01 DIAGNOSIS — R1011 Right upper quadrant pain: Secondary | ICD-10-CM

## 2021-11-01 DIAGNOSIS — K219 Gastro-esophageal reflux disease without esophagitis: Secondary | ICD-10-CM

## 2021-11-01 DIAGNOSIS — D126 Benign neoplasm of colon, unspecified: Secondary | ICD-10-CM

## 2021-11-01 DIAGNOSIS — K7581 Nonalcoholic steatohepatitis (NASH): Secondary | ICD-10-CM

## 2021-11-01 NOTE — Patient Instructions (Signed)
For your abdominal pain would recommend a HIDA scan to further evaluate your gallbladder.  When you are ready to have this done, then please call our office and we will order it to be performed at [place] on pantoprazole for chronic GERD.  You can trial going to once a day to see how you do with this medication.  We will continue to monitor your liver function tests.  Follow-up in 6 months or sooner if needed.  It was nice seeing both you today.  I hope you have a happy new year.  Dr. Abbey Chatters  At [facility] we value your feedback. You may receive a survey about your visit today. Please share your experience as we strive to create trusting relationships with our patients to provide genuine, compassionate, quality care.  We appreciate your understanding and patience as we review any laboratory studies, imaging, and other diagnostic tests that are ordered as we care for you. Our office policy is 5 business days for review of these results, and any emergent or urgent results are addressed in a timely manner for your best interest. If you do not hear from our office in 1 week, please contact us.   We also encourage the use of MyChart, which contains your medical information for your review as well. If you are not enrolled in this feature, an access code is on this after visit summary for your convenience. Thank you for allowing [place] to be involved in your care.  It was great to see you today!  I hope you have a great rest of your Winter!    Elon Alas. Abbey Chatters, D.O. Gastroenterology and Hepatology [facility]

## 2021-11-01 NOTE — Progress Notes (Signed)
Referring Provider: Tammi Sou, MD Primary Care Physician:  Tammi Sou, MD Primary GI:  Dr. Abbey Chatters  Chief Complaint  Patient presents with   Nausea    Occ vomiting. Phenergan helps   Abdominal Pain    Right side of abd    HPI:   Brenda [name] is a 65 y.o. female who presents to clinic today for follow-up visit.  Initially seen for abnormal LFTs initially elevated aminotransferases with AST 117, ALT 48, alk phos and T bili WNL.  She underwent full serological work-up which is unremarkable.  Her methotrexate for her rheumatoid has been on hold and her LFTs are improved.    Right upper quadrant ultrasound 12/05/2020 showed normal gallbladder, fatty liver.  Risk factors for fatty liver disease include dyslipidemia and obesity.  No current or previous alcohol use.  Does have chronic abdominal pain.  Underwent EGD 02/14/2021 which showed H. pylori negative gastritis, otherwise unremarkable.  Currently taking pantoprazole 40 mg twice daily states her reflux is controlled.  Colonoscopy with numerous subcentimeter tubular adenomas removed.  Recommended 5-year recall.  Continues to have abdominal pain which is right upper and right lower quadrant pain.    Past Medical History:  Diagnosis Date   Aortic valve stenosis    "very mild"   Atypical chest pain 2007; 2015   cardiolyte neg, echo nl, cath showed mild/nonobstructive LAD disease   Bilateral primary osteoarthritis of knee 06/30/2012   Right >L.  Right unicompartmental knee arthroplasty 10/25/18   CAD (coronary artery disease)    Chronic LBP    Multiple surgeries   COPD (chronic obstructive pulmonary disease) ([place])    COVID-19 virus infection 06/02/2019   Eval at [facility] ED and d/c'd home.  Admitted 06/12/19 with hypoxic RF and bilat infiltrates.   DDD (degenerative disc disease)    spinal stenosis   Elevated liver enzymes    2021 after starting methotrexate. 2022->rheum d/c'd her methotrexate   Her abd u/s 12/05/20 showed  fatty liver, o/w normal.   Fatty liver    11/2020 liver u/s   Gastritis    H pylori NEG on EGD 02/2021   GERD (gastroesophageal reflux disease)    History of adenomatous polyp of colon 06/2021   2022, recall 5 yrs   History of GI bleed    NSAIDS   Hyperlipidemia    mixed   Microscopic hematuria    full w/ u unrevealing X 2   Normal nuclear stress test 11/11 and 06/2014   negative, EF normal   Palpitations 2006   48H holter neg   Positive occult stool blood test    iFOB positive 04/2021->colonoscopy showed multiple adenomatous polyps and nonbleeding int hem.   Prediabetes    Highest A1c 6.1% as of 03/2017   RBBB (right bundle branch block)    Recurrent UTI    Seronegative rheumatoid arthritis ([place]) 10/2019   multiple sites->pred started, methotrex started 10/2019; rheum to add humira as of 03/2020   Tobacco dependence in remission    Quit fall 2015.      Past Surgical History:  Procedure Laterality Date   ABDOMINAL HYSTERECTOMY  1997   DUB   APPENDECTOMY  1984   BIOPSY  02/14/2021   Procedure: BIOPSY;  Surgeon: Eloise Harman, DO;  [facility];  Service: Endoscopy;;   CARDIAC CATHETERIZATION  10/09/2005   no CAD, mildly elevated LVEDP, normal LV function (Dr. Gerrie Nordmann)   CARDIAC CATHETERIZATION N/A 05/16/2015  Mild non-obstructive CAD--med mgmt.  Procedure: Left Heart Cath and Coronary Angiography;  Surgeon: Pixie Casino, MD;  Location: [place] [facility];  Service: Cardiovascular;  Laterality: N/A;   CARDIOVASCULAR STRESS TEST  06/2014   normal lexiscan NST   CARDIOVASCULAR STRESS TEST  2006   persantine - no ischemia, low risk    COLONOSCOPY W/ POLYPECTOMY  06/12/2021   multiple adenomas--recall 3-5 yrs   COLONOSCOPY WITH PROPOFOL N/A 06/12/2021   Procedure: COLONOSCOPY WITH PROPOFOL;  Surgeon: Eloise Harman, DO;  [facility];  Service: Endoscopy;  Laterality: N/A;  8:30am   ESOPHAGOGASTRODUODENOSCOPY  02/14/2021   Gastritis ( H pylori  NEG) w/out bleeding   ESOPHAGOGASTRODUODENOSCOPY (EGD) WITH PROPOFOL N/A 02/14/2021   Procedure: ESOPHAGOGASTRODUODENOSCOPY (EGD) WITH PROPOFOL;  Surgeon: Eloise Harman, DO;  [facility];  Service: Endoscopy;  Laterality: N/A;  AM   KNEE ARTHROSCOPY Bilateral    left wrist ganglion cyst excision  2010   [place]   right iliac crest bone graft+metal instrumentation; 2005 metal removal and decompression, 2011 lumbar decompression 4-11, then stabilization/ instrumentation done 09-19-10: L2,L3, L5 left and L2 , L3, L4 right pedical remnant L4 left embedded. Left iliac crest bone graft-- Dr Zollie Pee SPINE SURGERY  02/10/2016   Dr. Velna Ochs: lumbar decompression, instrumentation removal, and fusion exploration--HELPED her a lot, esp her radicular leg pains.   OOPHORECTOMY Right    cyst   PARTIAL KNEE ARTHROPLASTY Right 11/04/2018   Procedure: UNICOMPARTMENTAL KNEE;  Surgeon: Renette Butters, MD;  Location: [facility];  Service: Orthopedics;  Laterality: Right;  Adductor Block   POLYPECTOMY  06/12/2021   Procedure: POLYPECTOMY;  Surgeon: Eloise Harman, DO;  [facility];  Service: Endoscopy;;   TONSILLECTOMY  65 yrs old   TRANSTHORACIC ECHOCARDIOGRAM  2006   EF=>55%, trace MR, mild TR, trace AV regurg, trace pulm valve regurg     Current Outpatient Medications  Medication Sig Dispense Refill   acetaminophen (TYLENOL) 500 MG tablet Take 500 mg by mouth every 6 (six) hours as needed for fever.     Adalimumab (HUMIRA) 40 MG/0.8ML PSKT Inject 40 mg into the skin every 14 (fourteen) days.     albuterol (VENTOLIN HFA) 108 (90 Base) MCG/ACT inhaler Inhale 2 puffs into the lungs every 4 (four) hours as needed for wheezing or shortness of breath (bronchitis). Reported on 03/12/2016 18 g 1   Calcium Carb-Cholecalciferol (CALCIUM/VITAMIN D PO) Take 1 tablet by mouth daily.     clopidogrel (PLAVIX) 75 MG tablet TAKE 1 TABLET(75 MG) BY MOUTH DAILY 90 tablet 1    cyclobenzaprine (FLEXERIL) 10 MG tablet TAKE 1 TABLET BY MOUTH EVERY 8 HOURS AS NEEDED 90 tablet 1   fenofibrate 160 MG tablet TAKE 1 TABLET BY MOUTH EVERY DAY WITH FOOD 90 tablet 1   folic acid (FOLVITE) 1 MG tablet Take 1 mg by mouth daily.      lisinopril (ZESTRIL) 5 MG tablet TAKE 1 TABLET(5 MG) BY MOUTH DAILY Strength: 5 mg 90 tablet 3   LORazepam (ATIVAN) 1 MG tablet TAKE 1 TABLET(1 MG) BY MOUTH TWICE DAILY 60 tablet 5   nitroGLYCERIN (NITROSTAT) 0.4 MG SL tablet Place 1 tablet (0.4 mg total) under the tongue every 5 (five) minutes as needed for chest pain (MAX 3 doses.). 10 tablet 1   oxyCODONE (OXY IR/ROXICODONE) 5 MG immediate release tablet 1-2 po tid prn pain 180 tablet 0   pantoprazole (PROTONIX)  40 MG tablet Take 1 tablet (40 mg total) by mouth 2 (two) times daily. 60 tablet 5  ° promethazine (PHENERGAN) 12.5 MG tablet 1-2 tabs po q6h prn nausea 20 tablet 2  ° rosuvastatin (CRESTOR) 40 MG tablet TAKE 1 TABLET(40 MG) BY MOUTH DAILY 90 tablet 1  ° °No current facility-administered medications for this visit.  ° ° °Allergies as of 11/01/2021 - Review Complete 11/01/2021  °Allergen Reaction Noted  ° Penicillins Anaphylaxis and Swelling   ° Aspirin Other (See Comments)   ° Beta adrenergic blockers Other (See Comments)   ° Citalopram Nausea And Vomiting 04/19/2020  ° Nsaids Other (See Comments)   ° Other Hives and Itching 06/06/2021  ° Plaquenil [hydroxychloroquine sulfate] Other (See Comments) 07/20/2021  ° Prochlorperazine edisylate Other (See Comments)   ° Sulfonamide derivatives Other (See Comments)   ° Amitriptyline hcl Palpitations and Other (See Comments)   ° ° °Family History  °Problem Relation Age of Onset  ° Heart Problems Mother   °     and thyroid problems  ° Hyperlipidemia Mother   ° Diabetes Mother   ° Breast cancer Mother   ° Heart failure Father   °     CHF, heart attack, diabetes, hyperlipidemia  ° Heart disease Brother   °     back problems  ° Hypertension Brother   ° Kidney failure  Maternal Grandmother   ° Stroke Maternal Grandfather   ° Cancer Paternal Grandmother   °     mouth - snuff  ° Heart attack Paternal Grandfather   °     stroke, HTN  ° Hyperlipidemia Brother   ° Heart attack Brother   ° Cancer Brother   ° Coronary artery disease Neg Hx   ° ° °Social History  ° °Socioeconomic History  ° Marital status: Married  °  Spouse name: Not on file  ° Number of children: Not on file  ° Years of education: Not on file  ° Highest education level: Not on file  °Occupational History  ° Not on file  °Tobacco Use  ° Smoking status: Former  °  Packs/day: 1.00  °  Years: 35.00  °  Pack years: 35.00  °  Types: Cigarettes  °  Quit date: 07/22/2014  °  Years since quitting: 7.2  ° Smokeless tobacco: Never  ° Tobacco comments:  °  down to ~2 cigarettes/daily (06/23/14) - Quit Smoking around 07/20/14!  °Vaping Use  ° Vaping Use: Never used  °Substance and Sexual Activity  ° Alcohol use: No  °  Alcohol/week: 0.0 standard drinks  ° Drug use: No  ° Sexual activity: Yes  °  Partners: Male  °  Birth control/protection: Surgical  °Other Topics Concern  ° Not on file  °Social History Narrative  ° Not on file  ° °Social Determinants of Health  ° °Financial Resource Strain: Not on file  °Food Insecurity: Not on file  °Transportation Needs: Not on file  °Physical Activity: Not on file  °Stress: Not on file  °Social Connections: Not on file  ° ° °Subjective: °Review of Systems  °Constitutional:  Negative for chills and fever.  °HENT:  Negative for congestion and hearing loss.   °Eyes:  Negative for blurred vision and double vision.  °Respiratory:  Negative for cough and shortness of breath.   °Cardiovascular:  Negative for chest pain and palpitations.  °Gastrointestinal:  Positive for abdominal pain and heartburn. Negative for blood in stool, constipation, diarrhea, melena and vomiting.  °  Genitourinary:  Negative for dysuria and urgency.  Musculoskeletal:  Negative for joint pain and myalgias.  Skin:  Negative for  itching and rash.  Neurological:  Negative for dizziness and headaches.  Psychiatric/Behavioral:  Negative for depression. The patient is not nervous/anxious.     Objective: BP (!) 141/76    Pulse 94    Temp (!) 97.1 F (36.2 C) (Temporal)    Ht 5' 3" (1.6 m)    Wt 206 lb 3.2 oz (93.5 kg)    BMI 36.53 kg/m  Physical Exam Constitutional:      Appearance: Normal appearance.  HENT:     Head: Normocephalic and atraumatic.  Eyes:     Extraocular Movements: Extraocular movements intact.     Conjunctiva/sclera: Conjunctivae normal.  Cardiovascular:     Rate and Rhythm: Normal rate and regular rhythm.  Pulmonary:     Effort: Pulmonary effort is normal.     Breath sounds: Normal breath sounds.  Abdominal:     General: Bowel sounds are normal.     Palpations: Abdomen is soft.  Musculoskeletal:        General: No swelling. Normal range of motion.     Cervical back: Normal range of motion and neck supple.  Skin:    General: Skin is warm and dry.     Coloration: Skin is not jaundiced.  Neurological:     General: No focal deficit present.     Mental Status: She is alert and oriented to person, place, and time.  Psychiatric:        Mood and Affect: Mood normal.        Behavior: Behavior normal.     Assessment: *Chronic GERD-well-controlled on pantoprazole *Abdominal pain *Adenomatous colon polyps *Abnormal liver function test  Plan: GERD well-controlled on pantoprazole twice daily.  She can decrease down to once daily and see how she does from a symptomatic standpoint.  Etiology of her abdominal pain remains unclear.  Not improved on PPI therapy.  EGD and colonoscopy findings per HPI.  Ultrasound showed no signs of gallbladder pathology.    Would recommend HIDA scan the patient would like to hold off for now.  I recommend she call our office when she is ready to have this scheduled and we will order it to be done at [facility].  Her aminotransferases have slowly improved since  stopping methotrexate.  Full serological work-up has been negative.  Continue to monitor.  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  Recommend 1-2# weight loss per week until ideal body weight through exercise & diet. Low fat/cholesterol diet.   Avoid sweets, sodas, fruit juices, sweetened beverages like tea, etc. Gradually increase exercise from 15 min daily up to 1 hr per day 5 days/week. Limit alcohol use.  Patient to follow-up in 6 months  11/01/2021 9:25 AM   Disclaimer: This note was dictated with voice recognition software. Similar sounding words can inadvertently be transcribed and may not be corrected upon review.

## 2021-11-09 ENCOUNTER — Other Ambulatory Visit: Payer: Self-pay

## 2021-11-09 ENCOUNTER — Emergency Department (HOSPITAL_COMMUNITY): Payer: Medicare HMO

## 2021-11-09 ENCOUNTER — Encounter (HOSPITAL_COMMUNITY): Payer: Self-pay | Admitting: *Deleted

## 2021-11-09 ENCOUNTER — Inpatient Hospital Stay (HOSPITAL_COMMUNITY)
Admission: EM | Admit: 2021-11-09 | Discharge: 2021-11-11 | DRG: 192 | Disposition: A | Discharge status: Home / Self Care | Payer: Medicare HMO | Attending: Family Medicine | Admitting: Family Medicine

## 2021-11-09 DIAGNOSIS — Z7902 Long term (current) use of antithrombotics/antiplatelets: Secondary | ICD-10-CM | POA: Diagnosis not present

## 2021-11-09 DIAGNOSIS — Z803 Family history of malignant neoplasm of breast: Secondary | ICD-10-CM | POA: Diagnosis not present

## 2021-11-09 DIAGNOSIS — Z87891 Personal history of nicotine dependence: Secondary | ICD-10-CM

## 2021-11-09 DIAGNOSIS — Z8616 Personal history of COVID-19: Secondary | ICD-10-CM | POA: Diagnosis not present

## 2021-11-09 DIAGNOSIS — Z833 Family history of diabetes mellitus: Secondary | ICD-10-CM | POA: Diagnosis not present

## 2021-11-09 DIAGNOSIS — U071 COVID-19: Secondary | ICD-10-CM | POA: Diagnosis not present

## 2021-11-09 DIAGNOSIS — I2510 Atherosclerotic heart disease of native coronary artery without angina pectoris: Secondary | ICD-10-CM | POA: Diagnosis not present

## 2021-11-09 DIAGNOSIS — Z90710 Acquired absence of both cervix and uterus: Secondary | ICD-10-CM

## 2021-11-09 DIAGNOSIS — Z8249 Family history of ischemic heart disease and other diseases of the circulatory system: Secondary | ICD-10-CM

## 2021-11-09 DIAGNOSIS — K219 Gastro-esophageal reflux disease without esophagitis: Secondary | ICD-10-CM | POA: Diagnosis present

## 2021-11-09 DIAGNOSIS — J9601 Acute respiratory failure with hypoxia: Secondary | ICD-10-CM | POA: Diagnosis not present

## 2021-11-09 DIAGNOSIS — Z823 Family history of stroke: Secondary | ICD-10-CM

## 2021-11-09 DIAGNOSIS — R0602 Shortness of breath: Secondary | ICD-10-CM | POA: Diagnosis not present

## 2021-11-09 DIAGNOSIS — R0603 Acute respiratory distress: Secondary | ICD-10-CM | POA: Diagnosis present

## 2021-11-09 DIAGNOSIS — J441 Chronic obstructive pulmonary disease with (acute) exacerbation: Secondary | ICD-10-CM | POA: Diagnosis not present

## 2021-11-09 DIAGNOSIS — E785 Hyperlipidemia, unspecified: Secondary | ICD-10-CM | POA: Diagnosis not present

## 2021-11-09 DIAGNOSIS — K760 Fatty (change of) liver, not elsewhere classified: Secondary | ICD-10-CM | POA: Diagnosis present

## 2021-11-09 DIAGNOSIS — R0902 Hypoxemia: Secondary | ICD-10-CM | POA: Diagnosis present

## 2021-11-09 LAB — CBC WITH DIFFERENTIAL/PLATELET
Abs Immature Granulocytes: 0.05 10*3/uL (ref 0.00–0.07)
Basophils Absolute: 0.1 10*3/uL (ref 0.0–0.1)
Basophils Relative: 1 %
Eosinophils Absolute: 0.2 10*3/uL (ref 0.0–0.5)
Eosinophils Relative: 1 %
HCT: 40.5 % (ref 36.0–46.0)
Hemoglobin: 13.3 g/dL (ref 12.0–15.0)
Immature Granulocytes: 0 %
Lymphocytes Relative: 31 %
Lymphs Abs: 4.7 10*3/uL — ABNORMAL HIGH (ref 0.7–4.0)
MCH: 32 pg (ref 26.0–34.0)
MCHC: 32.8 g/dL (ref 30.0–36.0)
MCV: 97.6 fL (ref 80.0–100.0)
Monocytes Absolute: 1.2 10*3/uL — ABNORMAL HIGH (ref 0.1–1.0)
Monocytes Relative: 8 %
Neutro Abs: 9.1 10*3/uL — ABNORMAL HIGH (ref 1.7–7.7)
Neutrophils Relative %: 59 %
Platelets: 194 10*3/uL (ref 150–400)
RBC: 4.15 MIL/uL (ref 3.87–5.11)
RDW: 12.8 % (ref 11.5–15.5)
WBC: 15.2 10*3/uL — ABNORMAL HIGH (ref 4.0–10.5)
nRBC: 0 % (ref 0.0–0.2)

## 2021-11-09 LAB — COMPREHENSIVE METABOLIC PANEL
ALT: 24 U/L (ref 0–44)
AST: 42 U/L — ABNORMAL HIGH (ref 15–41)
Albumin: 3.9 g/dL (ref 3.5–5.0)
Alkaline Phosphatase: 49 U/L (ref 38–126)
Anion gap: 10 (ref 5–15)
BUN: 8 mg/dL (ref 8–23)
CO2: 25 mmol/L (ref 22–32)
Calcium: 9.3 mg/dL (ref 8.9–10.3)
Chloride: 105 mmol/L (ref 98–111)
Creatinine, Ser: 0.69 mg/dL (ref 0.44–1.00)
GFR, Estimated: >60 mL/min (ref >60)
Glucose, Bld: 130 mg/dL — ABNORMAL HIGH (ref 70–99)
Potassium: 3.8 mmol/L (ref 3.5–5.1)
Sodium: 140 mmol/L (ref 135–145)
Total Bilirubin: 0.9 mg/dL (ref 0.3–1.2)
Total Protein: 7.4 g/dL (ref 6.5–8.1)

## 2021-11-09 LAB — RESP PANEL BY RT-PCR (FLU A&B, COVID) ARPGX2
Influenza A by PCR: NEGATIVE
Influenza B by PCR: NEGATIVE
SARS Coronavirus 2 by RT PCR: NEGATIVE

## 2021-11-09 MED ORDER — SODIUM CHLORIDE 0.9 % IV SOLN
500.0000 mg | INTRAVENOUS | Status: DC
  Administered 2021-11-09 – 2021-11-10 (×2): 500 mg via INTRAVENOUS
  Filled 2021-11-09 (×2): qty 5

## 2021-11-09 MED ORDER — FENOFIBRATE 160 MG PO TABS
160.0000 mg | ORAL_TABLET | Freq: Every day | ORAL | Status: DC
  Administered 2021-11-10 – 2021-11-11 (×2): 160 mg via ORAL
  Filled 2021-11-09 (×2): qty 1

## 2021-11-09 MED ORDER — BUDESONIDE 0.5 MG/2ML IN SUSP
2.0000 mg | Freq: Two times a day (BID) | RESPIRATORY_TRACT | Status: DC
  Administered 2021-11-09: 0.5 mg via RESPIRATORY_TRACT
  Filled 2021-11-09: qty 8

## 2021-11-09 MED ORDER — ALBUTEROL SULFATE (2.5 MG/3ML) 0.083% IN NEBU: 2.5000 mg | INHALATION_SOLUTION | RESPIRATORY_TRACT | Status: DC | PRN

## 2021-11-09 MED ORDER — NITROGLYCERIN 0.4 MG SL SUBL: 0.4000 mg | SUBLINGUAL_TABLET | SUBLINGUAL | Status: DC | PRN

## 2021-11-09 MED ORDER — ENOXAPARIN SODIUM 40 MG/0.4ML IJ SOSY
40.0000 mg | PREFILLED_SYRINGE | INTRAMUSCULAR | Status: DC
  Administered 2021-11-09 – 2021-11-10 (×2): 40 mg via SUBCUTANEOUS
  Filled 2021-11-09 (×2): qty 0.4

## 2021-11-09 MED ORDER — CLOPIDOGREL BISULFATE 75 MG PO TABS
75.0000 mg | ORAL_TABLET | Freq: Every day | ORAL | Status: DC
  Administered 2021-11-10 – 2021-11-11 (×2): 75 mg via ORAL
  Filled 2021-11-09 (×2): qty 1

## 2021-11-09 MED ORDER — FOLIC ACID 1 MG PO TABS
1.0000 mg | ORAL_TABLET | Freq: Every day | ORAL | Status: DC
  Administered 2021-11-10 – 2021-11-11 (×2): 1 mg via ORAL
  Filled 2021-11-09 (×2): qty 1

## 2021-11-09 MED ORDER — ROSUVASTATIN CALCIUM 20 MG PO TABS
40.0000 mg | ORAL_TABLET | Freq: Every day | ORAL | Status: DC
  Administered 2021-11-09 – 2021-11-11 (×3): 40 mg via ORAL
  Filled 2021-11-09 (×3): qty 2

## 2021-11-09 MED ORDER — PANTOPRAZOLE SODIUM 40 MG PO TBEC
40.0000 mg | DELAYED_RELEASE_TABLET | Freq: Two times a day (BID) | ORAL | Status: DC
  Administered 2021-11-09 – 2021-11-11 (×4): 40 mg via ORAL
  Filled 2021-11-09 (×4): qty 1

## 2021-11-09 MED ORDER — METHYLPREDNISOLONE SODIUM SUCC 40 MG IJ SOLR
40.0000 mg | Freq: Two times a day (BID) | INTRAMUSCULAR | Status: AC
  Administered 2021-11-09 – 2021-11-10 (×2): 40 mg via INTRAVENOUS
  Filled 2021-11-09 (×2): qty 1

## 2021-11-09 MED ORDER — ACETAMINOPHEN 325 MG PO TABS: 650.0000 mg | ORAL_TABLET | Freq: Four times a day (QID) | ORAL | Status: DC | PRN

## 2021-11-09 MED ORDER — IPRATROPIUM-ALBUTEROL 0.5-2.5 (3) MG/3ML IN SOLN
3.0000 mL | Freq: Four times a day (QID) | RESPIRATORY_TRACT | Status: DC
  Administered 2021-11-09 – 2021-11-10 (×5): 3 mL via RESPIRATORY_TRACT
  Filled 2021-11-09 (×5): qty 3

## 2021-11-09 MED ORDER — ALBUTEROL SULFATE (2.5 MG/3ML) 0.083% IN NEBU
10.0000 mg | INHALATION_SOLUTION | Freq: Once | RESPIRATORY_TRACT | Status: AC
Start: 2021-11-09 — End: 2021-11-09
  Administered 2021-11-09: 10 mg via RESPIRATORY_TRACT
  Filled 2021-11-09: qty 12

## 2021-11-09 MED ORDER — BUDESONIDE 0.5 MG/2ML IN SUSP
0.5000 mg | Freq: Two times a day (BID) | RESPIRATORY_TRACT | Status: DC
  Administered 2021-11-10 – 2021-11-11 (×3): 0.5 mg via RESPIRATORY_TRACT
  Filled 2021-11-09 (×2): qty 2

## 2021-11-09 MED ORDER — INFLUENZA VAC A&B SA ADJ QUAD 0.5 ML IM PRSY: 0.5000 mL | PREFILLED_SYRINGE | INTRAMUSCULAR | Status: DC

## 2021-11-09 MED ORDER — PREDNISONE 20 MG PO TABS
40.0000 mg | ORAL_TABLET | Freq: Every day | ORAL | Status: DC
  Administered 2021-11-11: 40 mg via ORAL
  Filled 2021-11-09 (×2): qty 2

## 2021-11-09 MED ORDER — ACETAMINOPHEN 325 MG PO TABS
650.0000 mg | ORAL_TABLET | Freq: Once | ORAL | Status: AC
Start: 2021-11-09 — End: 2021-11-09
  Administered 2021-11-09: 650 mg via ORAL
  Filled 2021-11-09: qty 2

## 2021-11-09 MED ORDER — ACETAMINOPHEN 650 MG RE SUPP: 650.0000 mg | Freq: Four times a day (QID) | RECTAL | Status: DC | PRN

## 2021-11-09 MED ORDER — METHYLPREDNISOLONE SODIUM SUCC 125 MG IJ SOLR
125.0000 mg | Freq: Once | INTRAMUSCULAR | Status: AC
Start: 2021-11-09 — End: 2021-11-09
  Administered 2021-11-09: 125 mg via INTRAVENOUS
  Filled 2021-11-09: qty 2

## 2021-11-09 MED ORDER — ENSURE ENLIVE PO LIQD
237.0000 mL | Freq: Two times a day (BID) | ORAL | Status: DC
  Administered 2021-11-10 – 2021-11-11 (×3): 237 mL via ORAL

## 2021-11-09 MED ORDER — LORAZEPAM 1 MG PO TABS
1.0000 mg | ORAL_TABLET | Freq: Two times a day (BID) | ORAL | Status: DC
  Administered 2021-11-09 – 2021-11-11 (×4): 1 mg via ORAL
  Filled 2021-11-09 (×4): qty 1

## 2021-11-09 MED ORDER — OXYCODONE HCL 5 MG PO TABS
5.0000 mg | ORAL_TABLET | Freq: Four times a day (QID) | ORAL | Status: DC | PRN
  Administered 2021-11-09 – 2021-11-11 (×4): 5 mg via ORAL
  Filled 2021-11-09 (×4): qty 1

## 2021-11-09 NOTE — ED Provider Notes (Signed)
Brenda [name] Provider Note   CSN: [id number] Arrival date & time: 11/09/21  1315     History  Chief Complaint  Patient presents with   Fever    Brenda [name] is a 66 y.o. female.  HPI  Patient with medical history including CAD, COPD, anxiety presents to the emergency [name] with chief complaint of positive COVID test.  Patient states that she a positive COVID test on Friday but symptoms started on Thursday, she endorses fevers, chills, nasal ingestion, productive cough, chest tightness, wheezing, general body aches.  She is not vaccine against COVID or influenza, she is not immunocompromise, states that she has been taking her inhaler without much relief.  She denies any chest pain, pleuritic chest pain, peripheral edema, no she PEs or DVTs currently on hormone therapy.  Patient denies any alleviating factors, has not taken over-the-counter pain medications.  She is still tolerating p.o.  No stomach pains, nausea, vomiting, diarrhea.  Home Medications Prior to Admission medications   Medication Sig Start Date End Date Taking? Authorizing Provider  acetaminophen (TYLENOL) 500 MG tablet Take 500 mg by mouth every 6 (six) hours as needed for fever.    [provider]  Adalimumab (HUMIRA) 40 MG/0.8ML PSKT Inject 40 mg into the skin every 14 (fourteen) days.    [provider]  albuterol (VENTOLIN HFA) 108 (90 Base) MCG/ACT inhaler Inhale 2 puffs into the lungs every 4 (four) hours as needed for wheezing or shortness of breath (bronchitis). Reported on 03/12/2016 10/19/21   Tammi Sou, MD  Calcium Carb-Cholecalciferol (CALCIUM/VITAMIN D PO) Take 1 tablet by mouth daily.    [provider]  clopidogrel (PLAVIX) 75 MG tablet TAKE 1 TABLET(75 MG) BY MOUTH DAILY 09/06/21   McGowen, Adrian Blackwater, MD  cyclobenzaprine (FLEXERIL) 10 MG tablet TAKE 1 TABLET BY MOUTH EVERY 8 HOURS AS NEEDED 09/13/21   McGowen, Adrian Blackwater, MD  fenofibrate 160 MG tablet  TAKE 1 TABLET BY MOUTH EVERY DAY WITH FOOD 09/06/21   McGowen, Adrian Blackwater, MD  folic acid (FOLVITE) 1 MG tablet Take 1 mg by mouth daily.  10/27/19   [provider]  lisinopril (ZESTRIL) 5 MG tablet TAKE 1 TABLET(5 MG) BY MOUTH DAILY Strength: 5 mg 10/19/21   McGowen, Adrian Blackwater, MD  LORazepam (ATIVAN) 1 MG tablet TAKE 1 TABLET(1 MG) BY MOUTH TWICE DAILY 10/04/21   McGowen, Adrian Blackwater, MD  nitroGLYCERIN (NITROSTAT) 0.4 MG SL tablet Place 1 tablet (0.4 mg total) under the tongue every 5 (five) minutes as needed for chest pain (MAX 3 doses.). 09/24/19   McGowen, Adrian Blackwater, MD  oxyCODONE (OXY IR/ROXICODONE) 5 MG immediate release tablet 1-2 po tid prn pain 10/19/21   McGowen, Adrian Blackwater, MD  pantoprazole (PROTONIX) 40 MG tablet Take 1 tablet (40 mg total) by mouth 2 (two) times daily. 10/19/21 04/17/22  McGowen, Adrian Blackwater, MD  promethazine (PHENERGAN) 12.5 MG tablet 1-2 tabs po q6h prn nausea 10/19/21   McGowen, Adrian Blackwater, MD  rosuvastatin (CRESTOR) 40 MG tablet TAKE 1 TABLET(40 MG) BY MOUTH DAILY 09/06/21   McGowen, Adrian Blackwater, MD      Allergies    Penicillins, Aspirin, Beta adrenergic blockers, Citalopram, Nsaids, Other, Plaquenil [hydroxychloroquine sulfate], Prochlorperazine edisylate, Sulfonamide derivatives, and Amitriptyline hcl    Review of Systems   Review of Systems  Constitutional:  Negative for chills and fever.  HENT:  Positive for congestion. Negative for sore throat.   Respiratory:  Positive for cough and chest  tightness. Negative for shortness of breath.   Cardiovascular:  Negative for chest pain.  Gastrointestinal:  Negative for abdominal pain, diarrhea, nausea and vomiting.  Genitourinary:  Negative for enuresis.  Musculoskeletal:  Negative for back pain.  Skin:  Negative for rash.  Neurological:  Positive for headaches. Negative for dizziness.  Hematological:  Does not bruise/bleed easily.   Physical Exam Updated Vital Signs BP 115/67 (BP Location: Right Arm)    Pulse 86     Temp 98.8 F (37.1 C)    Resp 18    Ht 5\' 3"  (1.6 m)    Wt 86.2 kg    SpO2 100%    BMI 33.66 kg/m  Physical Exam Vitals and nursing note reviewed.  Constitutional:      General: She is not in acute distress.    Appearance: She is not ill-appearing.  HENT:     Head: Normocephalic and atraumatic.     Nose: Nose normal. No congestion.     Mouth/Throat:     Mouth: Mucous membranes are moist.     Pharynx: Oropharynx is clear.  Eyes:     Conjunctiva/sclera: Conjunctivae normal.  Cardiovascular:     Rate and Rhythm: Normal rate and regular rhythm.     Pulses: Normal pulses.     Heart sounds: No murmur heard.   No friction rub. No gallop.  Pulmonary:     Effort: No respiratory distress.     Breath sounds: Wheezing present. No rhonchi or rales.     Comments: On presentation patient did not appear to be in respiratory distress, slightly tachypneic, but nonhypoxic, able to speak in full sentences.  Triage note placed patient on 2 L via nasal cannula this was removed during my examination and O2 sats remained in the low 90s.  Patient had expiratory wheezing heard bilaterally, no rales or rhonchi present.  Slightly tight sounding. Abdominal:     Palpations: Abdomen is soft.     Tenderness: There is no abdominal tenderness. There is no right CVA tenderness or left CVA tenderness.  Musculoskeletal:     Right lower leg: No edema.     Left lower leg: No edema.  Skin:    General: Skin is warm and dry.  Neurological:     Mental Status: She is alert.  Psychiatric:        Mood and Affect: Mood normal.    ED Results / Procedures / Treatments   Labs (all labs ordered are listed, but only abnormal results are displayed) Labs Reviewed  CBC WITH DIFFERENTIAL/PLATELET - Abnormal; Notable for the following components:      Result Value   WBC 15.2 (*)    Neutro Abs 9.1 (*)    Lymphs Abs 4.7 (*)    Monocytes Absolute 1.2 (*)    All other components within normal limits  COMPREHENSIVE METABOLIC  PANEL - Abnormal; Notable for the following components:   Glucose, Bld 130 (*)    AST 42 (*)    All other components within normal limits  RESP PANEL BY RT-PCR (FLU A&B, COVID) ARPGX2  CULTURE, BLOOD (ROUTINE X 2)  CULTURE, BLOOD (ROUTINE X 2)  BLOOD GAS, VENOUS    EKG None  Radiology DG Chest 2 View  Result Date: 11/09/2021 CLINICAL DATA:  short of breath EXAM: CHEST - 2 VIEW COMPARISON:  Chest x-ray 12/05/2020, CT chest 05/08/2014 FINDINGS: The heart and mediastinal contours are unchanged. Aortic calcification. No focal consolidation. No pulmonary edema. No pleural effusion. No pneumothorax. No  acute osseous abnormality. IMPRESSION: No active cardiopulmonary disease. Electronically Signed   By: Iven Finn M.D.   On: 11/09/2021 15:02    Procedures Procedures    Medications Ordered in ED Medications  azithromycin (ZITHROMAX) 500 mg in sodium chloride 0.9 % 250 mL IVPB (has no administration in time range)  methylPREDNISolone sodium succinate (SOLU-MEDROL) 125 mg/2 mL injection 125 mg (125 mg Intravenous Given 11/09/21 1802)  albuterol (PROVENTIL) (2.5 MG/3ML) 0.083% nebulizer solution 10 mg (10 mg Nebulization Given 11/09/21 1747)  acetaminophen (TYLENOL) tablet 650 mg (650 mg Oral Given 11/09/21 1736)    ED Course/ Medical Decision Making/ A&P                           Medical Decision Making  This patient presents to the ED for concern of URI-like symptoms, this involves an extensive number of treatment options, and is a complaint that carries with it a high risk of complications and morbidity.  The differential diagnosis includes pneumonia, COPD exacerbation, PE    Additional history obtained:  Additional history obtained from electronic medical records, husband who is at bedside   Co morbidities that complicate the patient evaluation  COPD,  Social Determinants of Health:  N/A    Lab Tests:  I Ordered, and personally interpreted labs.  The pertinent results  include: CBC shows slight leukocytosis of 15.2, CMP shows glucose of 130 AST 42, respiratory panel negative,   Imaging Studies ordered:  I ordered imaging studies including chest x-ray I independently visualized and interpreted imaging which showed negative acute findings I agree with the radiologist interpretation    Medicines ordered and prescription drug management:  I ordered medication including nebulizing treatment for wheezing Reevaluation of the patient after these medicines showed that the patient improved I have reviewed the patients home medicines and have made adjustments as needed   Reevaluation:  Patient is reassessed after continuous neb, she still tight sounding and wheezing on examination, she remains in the low 90s while on room air.  At this point I recommend admission for COPD exacerbation.  She is agreement this plan.  Will consult with hospitalist team for admission.  Consultation- spoke with Dr. Bonney Aid who will admit the patient.   Test Considered:  CT of the chest but will defer as presentation more consistent with viral I lower suspicion for PE at this time.    Rule out Low suspicion for systemic infection as patient is nontoxic-appearing, she does have an elevated white count and came with a slight fever but I suspect this is more secondary due to viral URI.  Low suspicion for pneumonia as lung sounds are clear bilaterally, x-ray did not reveal any acute findings.  I have low suspicion for PE nontachypneic, nonhypoxic, initially was tachycardic but this is since resolved at the patient is given a breathing treatment.  low suspicion for strep throat as oropharynx was visualized, no erythema or exudates noted.  Low suspicion patient would need  hospitalized due to viral infection or Covid as vital signs reassuring, patient is not in respiratory distress.      Dispostion:  After consideration of the diagnostic results and the patients response to  treatment, I feel that the patent would benefit from also admission for COPD exacerbation.  Problem List / ED Course:  Acute respiratory failure with hypoxia secondary due to COPD exacerbation.            Final Clinical Impression(s) / ED  Diagnoses Final diagnoses:  Acute respiratory failure with hypoxia ([place])  COPD exacerbation [facility])    Rx / DC Orders ED Discharge Orders     None         Aron Baba 11/09/21 1942    Milton Ferguson, MD 11/10/21 1200

## 2021-11-09 NOTE — H&P (Addendum)
TRH H&P   Patient Demographics:    Brenda [name], is a 66 y.o. female  MRN: [id number]   DOB - 11/21/1955  Admit Date - 11/09/2021  Outpatient Primary MD for the patient is McGowen, Adrian Blackwater, MD  Referring MD/NP/PA: PA Ileene Patrick    Patient coming from: home  Chief Complaint  Patient presents with   Fever      HPI:    Brenda [name]  is a 66 y.o. female, with past medical history of CAD, COPD, anxiety, she presents to ED secondary to complaints of fever, shortness of breath and nasal congestion, patient reports she had a home test kit COVID-19 positive last Thursday, reports her symptoms started few days prior to Thursday, ports she had fever, chills, nasal congestion and productive cough, and wheezing, reported improvement of her symptoms for few days, but report over 24 hours she had significant wheezing and shortness of breath which prompted her to come to ED. - in ED she was febrile at 100.4, chest x-ray with no acute findings, her COVID-19 test was negative in ED, labs significant for white blood cell count of 15, blood cultures were sent, she was started on steroid and azithromycin and [facility] consulted to admit.    Review of systems:    In addition to the HPI above,  Reports fever, chills at home generalized weakness and fatigue No Headache, No changes with Vision or hearing, No problems swallowing food or Liquids, No Chest pain, report cough and shortness of breath No Abdominal pain, No Nausea or Vommitting, Bowel movements are regular, No Blood in stool or Urine, No dysuria, No new skin rashes or bruises, No new joints pains-aches,  No new weakness, tingling, numbness in any extremity, No recent weight gain or loss, No polyuria, polydypsia or polyphagia, No significant Mental Stressors.  A full 10 point Review of Systems was done, except as stated above,  all other Review of Systems were negative.   With Past History of the following :    Past Medical History:  Diagnosis Date   Aortic valve stenosis    "very mild"   Atypical chest pain 2007; 2015   cardiolyte neg, echo nl, cath showed mild/nonobstructive LAD disease   Bilateral primary osteoarthritis of knee 06/30/2012   Right >L.  Right unicompartmental knee arthroplasty 10/25/18   CAD (coronary artery disease)    Chronic LBP    Multiple surgeries   COPD (chronic obstructive pulmonary disease) ([place])    COVID-19 virus infection 06/02/2019   Eval at [facility] ED and d/c'd home.  Admitted 06/12/19 with hypoxic RF and bilat infiltrates.   DDD (degenerative disc disease)    spinal stenosis   Elevated liver enzymes    2021 after starting methotrexate. 2022->rheum d/c'd her methotrexate   Her abd u/s 12/05/20 showed fatty liver, o/w normal.   Fatty liver    11/2020 liver u/s  Gastritis    H pylori NEG on EGD 02/2021   GERD (gastroesophageal reflux disease)    History of adenomatous polyp of colon 06/2021   2022, recall 5 yrs   History of GI bleed    NSAIDS   Hyperlipidemia    mixed   Microscopic hematuria    full w/ u unrevealing X 2   Normal nuclear stress test 11/11 and 06/2014   negative, EF normal   Palpitations 2006   48H holter neg   Positive occult stool blood test    iFOB positive 04/2021->colonoscopy showed multiple adenomatous polyps and nonbleeding int hem.   Prediabetes    Highest A1c 6.1% as of 03/2017   RBBB (right bundle branch block)    Recurrent UTI    Seronegative rheumatoid arthritis ([place]) 10/2019   multiple sites->pred started, methotrex started 10/2019; rheum to add humira as of 03/2020   Tobacco dependence in remission    Quit fall 2015.        Past Surgical History:  Procedure Laterality Date   ABDOMINAL HYSTERECTOMY  1997   DUB   APPENDECTOMY  1984   BIOPSY  02/14/2021   Procedure: BIOPSY;  Surgeon: Eloise Harman, DO;  [facility];   Service: Endoscopy;;   CARDIAC CATHETERIZATION  10/09/2005   no CAD, mildly elevated LVEDP, normal LV function (Dr. Gerrie Nordmann)   CARDIAC CATHETERIZATION N/A 05/16/2015   Mild non-obstructive CAD--med mgmt.  Procedure: Left Heart Cath and Coronary Angiography;  Surgeon: Pixie Casino, MD;  Location: [facility];  Service: Cardiovascular;  Laterality: N/A;   CARDIOVASCULAR STRESS TEST  06/2014   normal lexiscan NST   CARDIOVASCULAR STRESS TEST  2006   persantine - no ischemia, low risk    COLONOSCOPY W/ POLYPECTOMY  06/12/2021   multiple adenomas--recall 3-5 yrs   COLONOSCOPY WITH PROPOFOL N/A 06/12/2021   Procedure: COLONOSCOPY WITH PROPOFOL;  Surgeon: Eloise Harman, DO;  [facility];  Service: Endoscopy;  Laterality: N/A;  8:30am   ESOPHAGOGASTRODUODENOSCOPY  02/14/2021   Gastritis ( H pylori NEG) w/out bleeding   ESOPHAGOGASTRODUODENOSCOPY (EGD) WITH PROPOFOL N/A 02/14/2021   Procedure: ESOPHAGOGASTRODUODENOSCOPY (EGD) WITH PROPOFOL;  Surgeon: Eloise Harman, DO;  [facility];  Service: Endoscopy;  Laterality: N/A;  AM   KNEE ARTHROSCOPY Bilateral    left wrist ganglion cyst excision  2010   [place]   right iliac crest bone graft+metal instrumentation; 2005 metal removal and decompression, 2011 lumbar decompression 4-11, then stabilization/ instrumentation done 09-19-10: L2,L3, L5 left and L2 , L3, L4 right pedical remnant L4 left embedded. Left iliac crest bone graft-- Dr Zollie Pee SPINE SURGERY  02/10/2016   Dr. Velna Ochs: lumbar decompression, instrumentation removal, and fusion exploration--HELPED her a lot, esp her radicular leg pains.   OOPHORECTOMY Right    cyst   PARTIAL KNEE ARTHROPLASTY Right 11/04/2018   Procedure: UNICOMPARTMENTAL KNEE;  Surgeon: Renette Butters, MD;  Location: [facility];  Service: Orthopedics;  Laterality: Right;  Adductor Block   POLYPECTOMY  06/12/2021   Procedure: POLYPECTOMY;  Surgeon: Eloise Harman, DO;  [facility];  Service: Endoscopy;;   TONSILLECTOMY  66 yrs old   TRANSTHORACIC ECHOCARDIOGRAM  2006   EF=>55%, trace MR, mild TR, trace AV regurg, trace pulm valve regurg       Social History:     Social History   Tobacco Use   Smoking status: Former  Packs/day: 1.00    Years: 35.00    Pack years: 35.00    Types: Cigarettes    Quit date: 07/22/2014    Years since quitting: 7.3   Smokeless tobacco: Never   Tobacco comments:    down to ~2 cigarettes/daily (06/23/14) - Quit Smoking around 07/20/14!  Substance Use Topics   Alcohol use: No    Alcohol/week: 0.0 standard drinks        Family History :     Family History  Problem Relation Age of Onset   Heart Problems Mother        and thyroid problems   Hyperlipidemia Mother    Diabetes Mother    Breast cancer Mother    Heart failure Father        CHF, heart attack, diabetes, hyperlipidemia   Heart disease Brother        back problems   Hypertension Brother    Kidney failure Maternal Grandmother    Stroke Maternal Grandfather    Cancer Paternal Grandmother        mouth - snuff   Heart attack Paternal Grandfather        stroke, HTN   Hyperlipidemia Brother    Heart attack Brother    Cancer Brother    Coronary artery disease Neg Hx      Home Medications:   Prior to Admission medications   Medication Sig Start Date End Date Taking? Authorizing Provider  acetaminophen (TYLENOL) 500 MG tablet Take 500 mg by mouth every 6 (six) hours as needed for fever.   Yes [provider]  Adalimumab (HUMIRA) 40 MG/0.8ML PSKT Inject 40 mg into the skin every 14 (fourteen) days.   Yes [provider]  albuterol (VENTOLIN HFA) 108 (90 Base) MCG/ACT inhaler Inhale 2 puffs into the lungs every 4 (four) hours as needed for wheezing or shortness of breath (bronchitis). Reported on 03/12/2016 10/19/21  Yes McGowen, Adrian Blackwater, MD  Calcium Carb-Cholecalciferol (CALCIUM/VITAMIN D PO) Take 1 tablet  by mouth daily.   Yes [provider]  clopidogrel (PLAVIX) 75 MG tablet TAKE 1 TABLET(75 MG) BY MOUTH DAILY Patient taking differently: Take 75 mg by mouth daily. 09/06/21  Yes McGowen, Adrian Blackwater, MD  cyclobenzaprine (FLEXERIL) 10 MG tablet TAKE 1 TABLET BY MOUTH EVERY 8 HOURS AS NEEDED 09/13/21  Yes McGowen, Adrian Blackwater, MD  fenofibrate 160 MG tablet TAKE 1 TABLET BY MOUTH EVERY DAY WITH FOOD 09/06/21  Yes McGowen, Adrian Blackwater, MD  folic acid (FOLVITE) 1 MG tablet Take 1 mg by mouth daily.  10/27/19  Yes [provider]  lisinopril (ZESTRIL) 5 MG tablet TAKE 1 TABLET(5 MG) BY MOUTH DAILY Strength: 5 mg 10/19/21  Yes McGowen, Adrian Blackwater, MD  LORazepam (ATIVAN) 1 MG tablet TAKE 1 TABLET(1 MG) BY MOUTH TWICE DAILY Patient taking differently: Take 1 mg by mouth 2 (two) times daily. 10/04/21  Yes McGowen, Adrian Blackwater, MD  nitroGLYCERIN (NITROSTAT) 0.4 MG SL tablet Place 1 tablet (0.4 mg total) under the tongue every 5 (five) minutes as needed for chest pain (MAX 3 doses.). 09/24/19  Yes McGowen, Adrian Blackwater, MD  oxyCODONE (OXY IR/ROXICODONE) 5 MG immediate release tablet 1-2 po tid prn pain 10/19/21  Yes McGowen, Adrian Blackwater, MD  pantoprazole (PROTONIX) 40 MG tablet Take 1 tablet (40 mg total) by mouth 2 (two) times daily. 10/19/21 04/17/22 Yes McGowen, Adrian Blackwater, MD  promethazine (PHENERGAN) 12.5 MG tablet 1-2 tabs po q6h prn nausea 10/19/21  Yes McGowen, Adrian Blackwater,  MD  rosuvastatin (CRESTOR) 40 MG tablet TAKE 1 TABLET(40 MG) BY MOUTH DAILY Patient taking differently: Take 40 mg by mouth daily. 09/06/21  Yes McGowen, Adrian Blackwater, MD     Allergies:     Allergies  Allergen Reactions   Penicillins Anaphylaxis and Swelling    DID THE REACTION INVOLVE: Swelling of the face/tongue/throat, SOB, or low BP? Yes Sudden or severe rash/hives, skin peeling, or the inside of the mouth or nose? Yes Did it require medical treatment? Yes When did it last happen? 66 years old If all above answers are "NO", may proceed  with cephalosporin use.    Aspirin Other (See Comments)     GI Bleed   Beta Adrenergic Blockers Other (See Comments)    REACTION: decreased libido   Citalopram Nausea And Vomiting   Nsaids Other (See Comments)     GI Upset   Other Hives and Itching    Walnuts    Plaquenil [Hydroxychloroquine Sulfate] Other (See Comments)    Tachycardia   Prochlorperazine Edisylate Other (See Comments)     Stroke like symptoms   Sulfonamide Derivatives Other (See Comments)    Unknown allergic reaction   Amitriptyline Hcl Palpitations and Other (See Comments)     increased heart rate     Physical Exam:   Vitals  Blood pressure 115/67, pulse 86, temperature 98.8 F (37.1 C), resp. rate 18, height $RemoveBe'5\' 3"'nLlJnAPPQ$  (1.6 m), weight 86.2 kg, SpO2 100 %.   1. General well developed female, laying in bed, in mild discomfort.  2. Normal affect and insight, Not Suicidal or Homicidal, Awake Alert, Oriented X 3.  3. No F.N deficits, ALL C.Nerves Intact, Strength 5/5 all 4 extremities, Sensation intact all 4 extremities, Plantars down going.  4. Ears and Eyes appear Normal, Conjunctivae clear, PERRLA. Moist Oral Mucosa.  5. Supple Neck, No JVD, No cervical lymphadenopathy appriciated, No Carotid Bruits.  6. Symmetrical Chest wall movement, managed air entry bilaterally with diffuse wheezing  7. RRR, No Gallops, Rubs or Murmurs, No Parasternal Heave.  8. Positive Bowel Sounds, Abdomen Soft, No tenderness, No organomegaly appriciated,No rebound -guarding or rigidity.  9.  No Cyanosis, Normal Skin Turgor, No Skin Rash or Bruise.  10. Good muscle tone,  joints appear normal , no effusions, Normal ROM.  11. No Palpable Lymph Nodes in Neck or Axillae    Data Review:    CBC Recent Labs  Lab 11/09/21 1734  WBC 15.2*  HGB 13.3  HCT 40.5  PLT 194  MCV 97.6  MCH 32.0  MCHC 32.8  RDW 12.8  LYMPHSABS 4.7*  MONOABS 1.2*  EOSABS 0.2  BASOSABS 0.1    ------------------------------------------------------------------------------------------------------------------  Chemistries  Recent Labs  Lab 11/09/21 1734  NA 140  K 3.8  CL 105  CO2 25  GLUCOSE 130*  BUN 8  CREATININE 0.69  CALCIUM 9.3  AST 42*  ALT 24  ALKPHOS 49  BILITOT 0.9   ------------------------------------------------------------------------------------------------------------------ estimated creatinine clearance is 72.9 mL/min (by C-G formula based on SCr of 0.69 mg/dL). ------------------------------------------------------------------------------------------------------------------ No results for input(s): TSH, T4TOTAL, T3FREE, THYROIDAB in the last 72 hours.  Invalid input(s): FREET3  Coagulation profile No results for input(s): INR, PROTIME in the last 168 hours. ------------------------------------------------------------------------------------------------------------------- No results for input(s): DDIMER in the last 72 hours. -------------------------------------------------------------------------------------------------------------------  Cardiac Enzymes No results for input(s): CKMB, TROPONINI, MYOGLOBIN in the last 168 hours.  Invalid input(s): CK ------------------------------------------------------------------------------------------------------------------    Component Value Date/Time   BNP 38.0 06/13/2019 1726     ---------------------------------------------------------------------------------------------------------------  Urinalysis    Component Value Date/Time   COLORURINE YELLOW 06/16/2019 1549   APPEARANCEUR CLEAR 06/16/2019 1549   LABSPEC 1.008 06/16/2019 1549   PHURINE 6.0 06/16/2019 1549   GLUCOSEU NEGATIVE 06/16/2019 1549   HGBUR NEGATIVE 06/16/2019 1549   BILIRUBINUR Negative 03/29/2021 1450   KETONESUR NEGATIVE 06/16/2019 1549   PROTEINUR Positive (A) 03/29/2021 1450   PROTEINUR NEGATIVE 06/16/2019 1549    UROBILINOGEN 1.0 03/29/2021 1450   UROBILINOGEN 0.2 08/21/2013 2259   NITRITE Negative 03/29/2021 1450   NITRITE NEGATIVE 06/16/2019 1549   LEUKOCYTESUR Large (3+) (A) 03/29/2021 1450   LEUKOCYTESUR TRACE (A) 06/16/2019 1549    ----------------------------------------------------------------------------------------------------------------   Imaging Results:    DG Chest 2 View  Result Date: 11/09/2021 CLINICAL DATA:  short of breath EXAM: CHEST - 2 VIEW COMPARISON:  Chest x-ray 12/05/2020, CT chest 05/08/2014 FINDINGS: The heart and mediastinal contours are unchanged. Aortic calcification. No focal consolidation. No pulmonary edema. No pleural effusion. No pneumothorax. No acute osseous abnormality. IMPRESSION: No active cardiopulmonary disease. Electronically Signed   By: Iven Finn M.D.   On: 11/09/2021 15:02       Assessment & Plan:    Principal Problem:   COPD exacerbation (HCC) Active Problems:   CAD (coronary artery disease)   Gastroesophageal reflux disease without esophagitis   COPD exacerbation -She presents with dyspnea, significant wheezing on presentation, low-grade temperature 100.4, chest x-ray with no evidence of pneumonia, and she is with known history of COPD in the past, but she did quit smoking 8 years ago. -She is admitted under COPD pathway, will continue with IV Solu-Medrol over next 24 hours and transition to p.o. prednisone, will continue with scheduled duo nebs and as needed albuterol, she was encouraged use incentive spirometer as well, given cough with some productive phlegm will keep on azithromycin as well. -We will check respiratory panel. Of note patient was positive for COVID-19 on home test kit 9 days ago, she is currently COVID-19 negative on admission, she had times few days before her COVID-19 home kit was positive, .medically no indication for COVID isolation.    CAD -Continue with Plavix and statin  Hyperlipidemia -Continue with  fenofibrate and statin  GERD -Continue with PPI  History of RA -She is on Humira as an outpatient  DVT Prophylaxis   Lovenox  AM Labs Ordered, also please review Full Orders  Family Communication: Admission, patients condition and plan of care including tests being ordered have been discussed with the patient and husband who indicate understanding and agree with the plan and Code Status.  Code Status full  Likely DC to  home  Condition GUARDED    Consults called: none    Admission status: observation    Time spent in minutes : 60 minutes   Phillips Climes M.D on 11/09/2021 at 8:05 PM   [facility] - Office  [phone]

## 2021-11-09 NOTE — ED Triage Notes (Signed)
States she tested positive for covid  last week, states she was negative today. States she is aching all over

## 2021-11-09 NOTE — ED Notes (Signed)
Pt aware urine sample is needed 

## 2021-11-10 DIAGNOSIS — Z803 Family history of malignant neoplasm of breast: Secondary | ICD-10-CM | POA: Diagnosis not present

## 2021-11-10 DIAGNOSIS — R0603 Acute respiratory distress: Secondary | ICD-10-CM | POA: Diagnosis present

## 2021-11-10 DIAGNOSIS — Z7902 Long term (current) use of antithrombotics/antiplatelets: Secondary | ICD-10-CM | POA: Diagnosis not present

## 2021-11-10 DIAGNOSIS — I2510 Atherosclerotic heart disease of native coronary artery without angina pectoris: Secondary | ICD-10-CM | POA: Diagnosis present

## 2021-11-10 DIAGNOSIS — Z90710 Acquired absence of both cervix and uterus: Secondary | ICD-10-CM | POA: Diagnosis not present

## 2021-11-10 DIAGNOSIS — Z823 Family history of stroke: Secondary | ICD-10-CM | POA: Diagnosis not present

## 2021-11-10 DIAGNOSIS — E785 Hyperlipidemia, unspecified: Secondary | ICD-10-CM | POA: Diagnosis present

## 2021-11-10 DIAGNOSIS — Z833 Family history of diabetes mellitus: Secondary | ICD-10-CM | POA: Diagnosis not present

## 2021-11-10 DIAGNOSIS — J441 Chronic obstructive pulmonary disease with (acute) exacerbation: Principal | ICD-10-CM

## 2021-11-10 DIAGNOSIS — Z8616 Personal history of COVID-19: Secondary | ICD-10-CM | POA: Diagnosis not present

## 2021-11-10 DIAGNOSIS — Z8249 Family history of ischemic heart disease and other diseases of the circulatory system: Secondary | ICD-10-CM | POA: Diagnosis not present

## 2021-11-10 DIAGNOSIS — K760 Fatty (change of) liver, not elsewhere classified: Secondary | ICD-10-CM | POA: Diagnosis present

## 2021-11-10 DIAGNOSIS — K219 Gastro-esophageal reflux disease without esophagitis: Secondary | ICD-10-CM | POA: Diagnosis present

## 2021-11-10 DIAGNOSIS — R0902 Hypoxemia: Secondary | ICD-10-CM | POA: Diagnosis present

## 2021-11-10 DIAGNOSIS — Z87891 Personal history of nicotine dependence: Secondary | ICD-10-CM | POA: Diagnosis not present

## 2021-11-10 LAB — RESPIRATORY PANEL BY PCR

## 2021-11-10 LAB — CBC
HCT: 38.3 % (ref 36.0–46.0)
Hemoglobin: 12.4 g/dL (ref 12.0–15.0)
MCH: 31.7 pg (ref 26.0–34.0)
MCHC: 32.4 g/dL (ref 30.0–36.0)
MCV: 98 fL (ref 80.0–100.0)
Platelets: 163 10*3/uL (ref 150–400)
RBC: 3.91 MIL/uL (ref 3.87–5.11)
RDW: 12.8 % (ref 11.5–15.5)
WBC: 7.8 10*3/uL (ref 4.0–10.5)
nRBC: 0 % (ref 0.0–0.2)

## 2021-11-10 LAB — BASIC METABOLIC PANEL
Anion gap: 11 (ref 5–15)
BUN: 12 mg/dL (ref 8–23)
CO2: 21 mmol/L — ABNORMAL LOW (ref 22–32)
Calcium: 9.5 mg/dL (ref 8.9–10.3)
Chloride: 107 mmol/L (ref 98–111)
Creatinine, Ser: 0.72 mg/dL (ref 0.44–1.00)
GFR, Estimated: >60 mL/min (ref >60)
Glucose, Bld: 229 mg/dL — ABNORMAL HIGH (ref 70–99)
Potassium: 3.9 mmol/L (ref 3.5–5.1)
Sodium: 139 mmol/L (ref 135–145)

## 2021-11-10 LAB — BLOOD GAS, VENOUS
Acid-Base Excess: 1.1 mmol/L (ref 0.0–2.0)
Bicarbonate: 24.3 mmol/L (ref 20.0–28.0)
Drawn by: 61882
FIO2: 21
O2 Saturation: 70.5 %
Patient temperature: 37.1
pCO2, Ven: 48.4 mmHg (ref 44.0–60.0)
pH, Ven: 7.351 (ref 7.250–7.430)
pO2, Ven: 40 mmHg (ref 32.0–45.0)

## 2021-11-10 LAB — HIV ANTIBODY (ROUTINE TESTING W REFLEX): HIV Screen 4th Generation wRfx: NONREACTIVE

## 2021-11-10 MED ORDER — IPRATROPIUM-ALBUTEROL 0.5-2.5 (3) MG/3ML IN SOLN
3.0000 mL | Freq: Three times a day (TID) | RESPIRATORY_TRACT | Status: DC
Start: 2021-11-11 — End: 2021-11-11
  Administered 2021-11-11: 3 mL via RESPIRATORY_TRACT
  Filled 2021-11-10: qty 3

## 2021-11-10 MED ORDER — GUAIFENESIN-DM 100-10 MG/5ML PO SYRP
10.0000 mL | ORAL_SOLUTION | Freq: Three times a day (TID) | ORAL | Status: DC
  Administered 2021-11-10 – 2021-11-11 (×3): 10 mL via ORAL
  Filled 2021-11-10 (×3): qty 10

## 2021-11-10 MED ORDER — HYDROCOD POLST-CPM POLST ER 10-8 MG/5ML PO SUER
5.0000 mL | Freq: Two times a day (BID) | ORAL | Status: DC
  Administered 2021-11-10 – 2021-11-11 (×3): 5 mL via ORAL
  Filled 2021-11-10 (×5): qty 5

## 2021-11-10 NOTE — Progress Notes (Signed)
PROGRESS NOTE    Patient: Brenda [name]                            PCP: Tammi Sou, MD                    DOB: 23-Nov-1955            DOA: 11/09/2021 ZMO:[id number]             DOS: 11/10/2021, 1:31 PM   LOS: 0 days   Date of Service: The patient was seen and examined on 11/10/2021  Subjective:   The patient was seen and examined this morning. Stable at this time. Still complaining of : Shortness of breath, supplemental oxygen Patient stating she is not O2 dependent at baseline  Brief Narrative:    Brenda [name]  is a 66 y.o. female, with past medical history of CAD, COPD, anxiety, she presents to ED secondary to complaints of fever, shortness of breath and nasal congestion, patient reports she had a home test kit COVID-19 positive last Thursday, reports her symptoms started few days prior to Thursday, ports she had fever, chills, nasal congestion and productive cough, and wheezing, reported improvement of her symptoms for few days, but report over 24 hours she had significant wheezing and shortness of breath which prompted her to come to ED. - in ED she was febrile at 100.4, chest x-ray with no acute findings, her COVID-19 test was negative in ED, labs significant for white blood cell count of 15, blood cultures were sent, she was started on steroid and azithromycin and [facility] consulted to admit.  Assessment & Plan:   Principal Problem:   COPD exacerbation ([place]) Active Problems:   CAD (coronary artery disease)   Gastroesophageal reflux disease without esophagitis   COPD exacerbation -acute respiratory distress -Required 2.5 L of oxygen via nasal cannula, currently satting 98% -On room air on admission was satting 79%  -She presents with dyspnea, significant wheezing on presentation, low-grade temperature 100.4, chest x-ray with no evidence of pneumonia, and she is with known history of COPD in the past, but she did quit smoking 8 years ago.  -Continue  supplemental oxygen, DuoNeb bronchodilator treatment, IV Solu-Medrol, -Empiric IV antibiotic azithromycin -Respiratory panel SARS-CoV-2, influenza A/B- negative on admission -We will continue with mucolytic's, cough meds   Of note patient was positive for COVID-19 on home test kit 9 days ago, she is currently COVID-19 negative on admission, she had times few days before her COVID-19 home kit was positive, .medically no indication for COVID isolation.     CAD -Continue with Plavix and statin -stable    Hyperlipidemia -Continue with fenofibrate and statin -stable    GERD -Continue with PPI   History of RA -She is on Humira as an outpatient   DVT Prophylaxis   Lovenox   ------------------------------------------------------------------------------------------------------------- Cultures; Blood Cultures x 2 >>   Sputum Culture -could be obtained  Antimicrobials: -IV azithromycin   Consultants: None   ------------------------------------------------------------------------------------------------------------------------------------------------  DVT prophylaxis:  SCD/Compression stockings and Lovenox SQ Code Status:   Code Status: Full Code  Family Communication: No family member present at bedside- attempt will be made to update daily The above findings and plan of care has been discussed with patient (and family)  in detail,  they expressed understanding and agreement of above. -Advance care planning has been discussed.   Admission status:   Status is: Observation  The  patient remains OBS appropriate and will d/c before 2 midnights.     Level of care: Med-Surg   Procedures:   No admission procedures for hospital encounter.    Antimicrobials:  Anti-infectives (From admission, onward)    Start     Dose/Rate Route Frequency Ordered Stop   11/09/21 1930  azithromycin (ZITHROMAX) 500 mg in sodium chloride 0.9 % 250 mL IVPB        500 mg 250 mL/hr over 60  Minutes Intravenous Every 24 hours 11/09/21 1922          Medication:   budesonide (PULMICORT) nebulizer solution  0.5 mg Nebulization BID   clopidogrel  75 mg Oral Daily   enoxaparin (LOVENOX) injection  40 mg Subcutaneous Q24H   feeding supplement  237 mL Oral BID BM   fenofibrate  160 mg Oral Daily   folic acid  1 mg Oral Daily   influenza vaccine adjuvanted  0.5 mL Intramuscular Tomorrow-1000   ipratropium-albuterol  3 mL Nebulization Q6H   LORazepam  1 mg Oral BID   pantoprazole  40 mg Oral BID   [START ON 11/11/2021] predniSONE  40 mg Oral Q breakfast   rosuvastatin  40 mg Oral Daily    acetaminophen **OR** acetaminophen, albuterol, nitroGLYCERIN, oxyCODONE   Objective:   Vitals:   11/10/21 0719 11/10/21 0723 11/10/21 0836 11/10/21 1321  BP:   (!) 127/59 (!) 106/42  Pulse:   99 94  Resp:    19  Temp:   97.8 F (36.6 C) 98.2 F (36.8 C)  TempSrc:   Oral Oral  SpO2: 99% 99% 97% 98%  Weight:      Height:        Intake/Output Summary (Last 24 hours) at 11/10/2021 1331 Last data filed at 11/10/2021 1100 Gross per 24 hour  Intake 727.96 ml  Output --  Net 727.96 ml   Filed Weights   11/09/21 1422 11/09/21 2324  Weight: 86.2 kg 90.9 kg     Examination:     General:  Alert, oriented, cooperative, no distress;   HEENT:  Normocephalic, PERRL, otherwise with in Normal limits   Neuro:  CNII-XII intact. , normal motor and sensation, reflexes intact   Lungs:   Clear to auscultation BL, Respirations unlabored, no wheezes / crackles  Cardio:    S1/S2, RRR, No murmure, No Rubs or Gallops   Abdomen:   Soft, non-tender, bowel sounds active all four quadrants,  no guarding or peritoneal signs.  Muscular skeletal:  Limited exam - in bed, able to move all 4 extremities, Normal strength,  2+ pulses,  symmetric, No pitting edema  Skin:  Dry, warm to touch, negative for any Rashes,  Wounds: Please see nursing documentation     --------------------------------------------------------------------------------------------------------------------    LABs:  CBC Latest Ref Rng & Units 11/10/2021 11/09/2021 09/13/2021  WBC 4.0 - 10.5 K/uL 7.8 15.2(H) 7.7  Hemoglobin 12.0 - 15.0 g/dL 12.4 13.3 13.8  Hematocrit 36.0 - 46.0 % 38.3 40.5 42  Platelets 150 - 400 K/uL 163 194 147(A)   CMP Latest Ref Rng & Units 11/10/2021 11/09/2021 09/13/2021  Glucose 70 - 99 mg/dL 229(H) 130(H) -  BUN 8 - 23 mg/dL 12 8 6   Creatinine 0.44 - 1.00 mg/dL 0.72 0.69 0.7  Sodium 135 - 145 mmol/L 139 140 144  Potassium 3.5 - 5.1 mmol/L 3.9 3.8 4.4  Chloride 98 - 111 mmol/L 107 105 105  CO2 22 - 32 mmol/L 21(L) 25 33(A)  Calcium 8.9 -  10.3 mg/dL 9.5 9.3 9.9  Total Protein 6.5 - 8.1 g/dL - 7.4 -  Total Bilirubin 0.3 - 1.2 mg/dL - 0.9 -  Alkaline Phos 38 - 126 U/L - 49 81  AST 15 - 41 U/L - 42(H) 49(A)  ALT 0 - 44 U/L - 24 69(A)       Micro Results Recent Results (from the past 240 hour(s))  Resp Panel by RT-PCR (Flu A&B, Covid) Nasopharyngeal Swab     Status: None   Collection Time: 11/09/21  3:14 PM   Specimen: Nasopharyngeal Swab; Nasopharyngeal(NP) swabs in vial transport medium  Result Value Ref Range Status   SARS Coronavirus 2 by RT PCR NEGATIVE NEGATIVE Final    Comment: (NOTE) SARS-CoV-2 target nucleic acids are NOT DETECTED.  The SARS-CoV-2 RNA is generally detectable in upper respiratory specimens during the acute phase of infection. The lowest concentration of SARS-CoV-2 viral copies this assay can detect is 138 copies/mL. A negative result does not preclude SARS-Cov-2 infection and should not be used as the sole basis for treatment or other patient management decisions. A negative result may occur with  improper specimen collection/handling, submission of specimen other than nasopharyngeal swab, presence of viral mutation(s) within the areas targeted by this assay, and inadequate number of viral copies(<138 copies/mL). A  negative result must be combined with clinical observations, patient history, and epidemiological information. The expected result is Negative.  Fact Sheet for Patients:  EntrepreneurPulse.com.au  Fact Sheet for Healthcare Providers:  IncredibleEmployment.be  This test is no t yet approved or cleared by the [place] FDA and  has been authorized for detection and/or diagnosis of SARS-CoV-2 by FDA under an Emergency Use Authorization (EUA). This EUA will remain  in effect (meaning this test can be used) for the duration of the COVID-19 declaration under Section 564(b)(1) of the Act, 21 U.S.C.section 360bbb-3(b)(1), unless the authorization is terminated  or revoked sooner.       Influenza A by PCR NEGATIVE NEGATIVE Final   Influenza B by PCR NEGATIVE NEGATIVE Final    Comment: (NOTE) The Xpert Xpress SARS-CoV-2/FLU/RSV plus assay is intended as an aid in the diagnosis of influenza from Nasopharyngeal swab specimens and should not be used as a sole basis for treatment. Nasal washings and aspirates are unacceptable for Xpert Xpress SARS-CoV-2/FLU/RSV testing.  Fact Sheet for Patients: EntrepreneurPulse.com.au  Fact Sheet for Healthcare Providers: IncredibleEmployment.be  This test is not yet approved or cleared by the [place] FDA and has been authorized for detection and/or diagnosis of SARS-CoV-2 by FDA under an Emergency Use Authorization (EUA). This EUA will remain in effect (meaning this test can be used) for the duration of the COVID-19 declaration under Section 564(b)(1) of the Act, 21 U.S.C. section 360bbb-3(b)(1), unless the authorization is terminated or revoked.  Performed at [facility], [address] [place], [place] [address]   Blood culture (routine x 2)     Status: None (Preliminary result)   Collection Time: 11/09/21  7:57 PM   Specimen: BLOOD RIGHT ARM  Result Value Ref  Range Status   Specimen Description BLOOD RIGHT ARM  Final   Special Requests   Final    BOTTLES DRAWN AEROBIC AND ANAEROBIC Blood Culture adequate volume   Culture   Final    NO GROWTH < 12 HOURS Performed at [facility], [address], [place], [place] [address]    Report Status PENDING  Incomplete  Blood culture (routine x 2)     Status: None (  Preliminary result)   Collection Time: 11/09/21  7:57 PM   Specimen: BLOOD LEFT ARM  Result Value Ref Range Status   Specimen Description BLOOD LEFT ARM  Final   Special Requests   Final    BOTTLES DRAWN AEROBIC AND ANAEROBIC Blood Culture adequate volume   Culture   Final    NO GROWTH < 12 HOURS Performed at [facility], [address], [place], [place] [address]    Report Status PENDING  Incomplete  Respiratory (~20 pathogens) panel by PCR     Status: None   Collection Time: 11/09/21 10:35 PM   Specimen: Nasopharyngeal Swab; Respiratory  Result Value Ref Range Status   Adenovirus NOT DETECTED NOT DETECTED Final   Coronavirus 229E NOT DETECTED NOT DETECTED Final    Comment: (NOTE) The Coronavirus on the Respiratory Panel, DOES NOT test for the novel  Coronavirus (2019 nCoV)    Coronavirus HKU1 NOT DETECTED NOT DETECTED Final   Coronavirus NL63 NOT DETECTED NOT DETECTED Final   Coronavirus OC43 NOT DETECTED NOT DETECTED Final   Metapneumovirus NOT DETECTED NOT DETECTED Final   Rhinovirus / Enterovirus NOT DETECTED NOT DETECTED Final   Influenza A NOT DETECTED NOT DETECTED Final   Influenza B NOT DETECTED NOT DETECTED Final   Parainfluenza Virus 1 NOT DETECTED NOT DETECTED Final   Parainfluenza Virus 2 NOT DETECTED NOT DETECTED Final   Parainfluenza Virus 3 NOT DETECTED NOT DETECTED Final   Parainfluenza Virus 4 NOT DETECTED NOT DETECTED Final   Respiratory Syncytial Virus NOT DETECTED NOT DETECTED Final   Bordetella pertussis NOT DETECTED NOT DETECTED Final   Bordetella Parapertussis NOT DETECTED NOT DETECTED Final    Chlamydophila pneumoniae NOT DETECTED NOT DETECTED Final   Mycoplasma pneumoniae NOT DETECTED NOT DETECTED Final    Comment: Performed at [facility], [place]. [address], [place],  [address]    Radiology Reports DG Chest 2 View  Result Date: 11/09/2021 CLINICAL DATA:  short of breath EXAM: CHEST - 2 VIEW COMPARISON:  Chest x-ray 12/05/2020, CT chest 05/08/2014 FINDINGS: The heart and mediastinal contours are unchanged. Aortic calcification. No focal consolidation. No pulmonary edema. No pleural effusion. No pneumothorax. No acute osseous abnormality. IMPRESSION: No active cardiopulmonary disease. Electronically Signed   By: Iven Finn M.D.   On: 11/09/2021 15:02    SIGNED: Deatra James, MD, FHM. [facility],  Pager (please use [email or web address] to page/text) Please use Epic Secure Chat for non-urgent communication (7AM-7PM)  If 7PM-7AM, please contact night-coverage [email or web address], 11/10/2021, 1:31 PM

## 2021-11-10 NOTE — Progress Notes (Signed)
Initial Nutrition Assessment  DOCUMENTATION CODES:   Obesity unspecified  INTERVENTION:  Ensure Enlive po BID, each supplement provides 350 kcal and 20 grams of protein   Encourage meals and ONS daily  NUTRITION DIAGNOSIS:   Inadequate oral intake (related to reported incosistent intake) related to acute illness, chronic illness (Acute and chronic COPD and recent COVID-19) as evidenced by per patient/family report (diet recall).   GOAL:  Patient will meet greater than or equal to 90% of their needs   MONITOR:  PO intake, Supplement acceptance, Labs  REASON FOR ASSESSMENT:   Malnutrition Screening Tool    ASSESSMENT: Patient is a 66 yo female with hx of CAD, COPD and recent COVID-26. She presents with fever. COPD exacerbation.   Patient denies change in appetite and says her appetite is up and down. She eats very well at times and others she will snack her way through the day. Able to feed herself. Family present and supportive. She says, "he makes me eat". PO: documented at 100% x 2 meals. Talked about the importance of healthy balanced diet and encouraged ONS on days her appetite is poor.   Patient self reported weight 195-200 lb. Currently 200 lb (90.9 kg).   Medications reviewed and include: Folic acid, fenofibrate, Protonix, Crestor. Zithromax   Labs reviewed:  BMP Latest Ref Rng & Units 11/10/2021 11/09/2021 09/13/2021  Glucose 70 - 99 mg/dL 229(H) 130(H) -  BUN 8 - 23 mg/dL 12 8 6   Creatinine 0.44 - 1.00 mg/dL 0.72 0.69 0.7  BUN/Creat Ratio 12 - 28 - - -  Sodium 135 - 145 mmol/L 139 140 144  Potassium 3.5 - 5.1 mmol/L 3.9 3.8 4.4  Chloride 98 - 111 mmol/L 107 105 105  CO2 22 - 32 mmol/L 21(L) 25 33(A)  Calcium 8.9 - 10.3 mg/dL 9.5 9.3 9.9       NUTRITION - FOCUSED PHYSICAL EXAM: Nutrition-Focused physical exam completed. Findings are no fat depletion, no muscle depletion, and no edema.      Diet Order:   Diet Order             Diet Heart Room service  appropriate? Yes; Fluid consistency: Thin  Diet effective now                   EDUCATION NEEDS:  Education needs have been addressed  Skin:  Skin Assessment: Reviewed RN Assessment  Last BM:  1/5  Height:   Ht Readings from Last 1 Encounters:  11/09/21 5\' 3"  (1.6 m)    Weight:   Wt Readings from Last 1 Encounters:  11/09/21 90.9 kg    Ideal Body Weight:   52 kg  BMI:  Body mass index is 35.5 kg/m.  Estimated Nutritional Needs:   Kcal:  1700-1800  Protein:  80--90 gr  Fluid:  >1600 ml daily  Colman Cater MS,RD,CSG,LDN Contact: Shea Evans

## 2021-11-10 NOTE — Progress Notes (Signed)
°  Transition of Care [facility]) Screening Note   Patient Details  Name: Brenda [name] Date of Birth: February 03, 1956   Transition of Care [facility]) CM/SW Contact:    Shade Flood, LCSW Phone Number: 11/10/2021, 11:37 AM    Transition of Care Department [facility]) has reviewed patient and no TOC needs have been identified at this time. We will continue to monitor patient advancement through interdisciplinary progression rounds. If new patient transition needs arise, please place a TOC consult.

## 2021-11-11 DIAGNOSIS — J441 Chronic obstructive pulmonary disease with (acute) exacerbation: Secondary | ICD-10-CM | POA: Diagnosis not present

## 2021-11-11 MED ORDER — AZITHROMYCIN 500 MG PO TABS: 500.0000 mg | ORAL_TABLET | Freq: Every day | ORAL | 0 refills | Status: AC

## 2021-11-11 MED ORDER — METHYLPREDNISOLONE 4 MG PO TBPK: ORAL_TABLET | ORAL | 0 refills | Status: DC

## 2021-11-11 MED ORDER — GUAIFENESIN-DM 100-10 MG/5ML PO SYRP: 10.0000 mL | ORAL_SOLUTION | Freq: Three times a day (TID) | ORAL | 0 refills | Status: AC

## 2021-11-11 NOTE — Progress Notes (Signed)
NT ambulated pt for desat screening before discharge. Pt maintained sats of 90-94% on RA. Will inform MD.

## 2021-11-11 NOTE — Plan of Care (Signed)

## 2021-11-11 NOTE — Discharge Summary (Signed)
Physician Discharge Summary [facility]    Patient: Brenda [name]                   Admit date: 11/09/2021   DOB: August 28, 1956             Discharge date:11/11/2021/10:42 AM PXT:[id number]                          PCP: Tammi Sou, MD  Disposition:   HOME    Recommendations for Outpatient Follow-up:   Follow up: With PCP within 2 weeks Instructed to continue current inhalers, taper down steroids, finish antibiotic course  Discharge Condition: Stable   Code Status:   Code Status: Full Code  Diet recommendation: Cardiac diet   Discharge Diagnoses:    Principal Problem:   COPD exacerbation ([place]) Active Problems:   CAD (coronary artery disease)   Gastroesophageal reflux disease without esophagitis   Hypoxia   History of Present Illness/ Hospital Course Brenda [name] Summary:    Brenda [name]  is a 67 y.o. female, with past medical history of CAD, COPD, anxiety, she presents to ED secondary to complaints of fever, shortness of breath and nasal congestion, patient reports she had a home test kit COVID-19 positive last Thursday, reports her symptoms started few days prior to Thursday, ports she had fever, chills, nasal congestion and productive cough, and wheezing, reported improvement of her symptoms for few days, but report over 24 hours she had significant wheezing and shortness of breath which prompted her to come to ED. - in ED she was febrile at 100.4, chest x-ray with no acute findings, her COVID-19 test was negative in ED, labs significant for white blood cell count of 15, blood cultures were sent, she was started on steroid and azithromycin and [facility] consulted to admit.    COPD exacerbation -acute respiratory distress -On this admission required up to 2.5 L of oxygen via nasal cannula, - - On room air on admission was satting 79% -Patient was successfully weaned off 2.5 L of oxygen,  Currently on room air satting> 92%   -She presents with dyspnea,  significant wheezing on presentation, low-grade temperature 100.4, chest x-ray with no evidence of pneumonia, and she is with known history of COPD in the past, but she did quit smoking 8 years ago.   -Treated with a as needed DuoNeb bronchodilators, supplemental oxygen, IV Solu-Medrol... Patient has improved, has been weaned off oxygen, resumed inhalers, Solu-Medrol switched to p.o. prednisone taper -IV antibiotics of azithromycin, switch to p.o. concluding 5 days of coverage   -Respiratory panel SARS-CoV-2, - Influenza A/B- negative on admission -Continue with mucolytic's, cough meds     Of note patient was positive for COVID-19 on home test kit 9 days ago, she is currently COVID-19 negative on admission, she had times few days before her COVID-19 home kit was positive, .medically no indication for COVID isolation.     CAD -Continue with Plavix and statin -stable    Hyperlipidemia -Continue with fenofibrate and statin -stable    GERD -Continue with PPI   History of RA -She is on Humira as an outpatient    Nutritional status:  Nutrition Problem: Inadequate oral intake (related to reported incosistent intake) Etiology: acute illness, chronic illness (Acute and chronic COPD and recent COVID-19) Signs/Symptoms: per patient/family report (diet recall) Interventions: Ensure Enlive (each supplement provides 350kcal and 20 grams of protein), Education  The patient's BMI is: Body  mass index is 35.5 kg/m. I agree with the assessment and plan as outlined    ------------------------------------------------------------------------------------------------------------- Cultures; Blood Cultures x 2 >> no growth to date Sputum Culture -could not be obtained   Antimicrobials: -IV azithromycin >> switch to p.o.     Disposition-stable to be discharged home    Code Status:   Code Status: Full Code     Discharge Instructions:   Discharge Instructions     Activity as tolerated  - No restrictions   Complete by: As directed    Call MD for:  difficulty breathing, headache or visual disturbances   Complete by: As directed    Call MD for:  extreme fatigue   Complete by: As directed    Call MD for:  persistant dizziness or light-headedness   Complete by: As directed    Call MD for:  persistant nausea and vomiting   Complete by: As directed    Call MD for:  temperature >100.4   Complete by: As directed    Diet - low sodium heart healthy   Complete by: As directed    Discharge instructions   Complete by: As directed    Follow with PCP in 2 weeks   Increase activity slowly   Complete by: As directed         Medication List     TAKE these medications    acetaminophen 500 MG tablet Commonly known as: TYLENOL Take 500 mg by mouth every 6 (six) hours as needed for fever.   albuterol 108 (90 Base) MCG/ACT inhaler Commonly known as: VENTOLIN HFA Inhale 2 puffs into the lungs every 4 (four) hours as needed for wheezing or shortness of breath (bronchitis). Reported on 03/12/2016   azithromycin 500 MG tablet Commonly known as: Zithromax Take 1 tablet (500 mg total) by mouth daily for 3 days.   CALCIUM/VITAMIN D PO Take 1 tablet by mouth daily.   clopidogrel 75 MG tablet Commonly known as: PLAVIX TAKE 1 TABLET(75 MG) BY MOUTH DAILY What changed: See the new instructions.   cyclobenzaprine 10 MG tablet Commonly known as: FLEXERIL TAKE 1 TABLET BY MOUTH EVERY 8 HOURS AS NEEDED   fenofibrate 160 MG tablet TAKE 1 TABLET BY MOUTH EVERY DAY WITH FOOD   folic acid 1 MG tablet Commonly known as: FOLVITE Take 1 mg by mouth daily.   guaiFENesin-dextromethorphan 100-10 MG/5ML syrup Commonly known as: ROBITUSSIN DM Take 10 mLs by mouth every 8 (eight) hours for 5 days.   Humira 40 MG/0.8ML Pskt Generic drug: Adalimumab Inject 40 mg into the skin every 14 (fourteen) days.   lisinopril 5 MG tablet Commonly known as: ZESTRIL TAKE 1 TABLET(5 MG) BY MOUTH  DAILY Strength: 5 mg   LORazepam 1 MG tablet Commonly known as: ATIVAN TAKE 1 TABLET(1 MG) BY MOUTH TWICE DAILY What changed: See the new instructions.   methylPREDNISolone 4 MG Tbpk tablet Commonly known as: MEDROL DOSEPAK Medrol Dosepak take as instructed   nitroGLYCERIN 0.4 MG SL tablet Commonly known as: NITROSTAT Place 1 tablet (0.4 mg total) under the tongue every 5 (five) minutes as needed for chest pain (MAX 3 doses.).   oxyCODONE 5 MG immediate release tablet Commonly known as: Oxy IR/ROXICODONE 1-2 po tid prn pain   pantoprazole 40 MG tablet Commonly known as: PROTONIX Take 1 tablet (40 mg total) by mouth 2 (two) times daily.   promethazine 12.5 MG tablet Commonly known as: PHENERGAN 1-2 tabs po q6h prn nausea   rosuvastatin 40 MG  tablet Commonly known as: CRESTOR TAKE 1 TABLET(40 MG) BY MOUTH DAILY What changed: See the new instructions.        Allergies  Allergen Reactions   Penicillins Anaphylaxis and Swelling    DID THE REACTION INVOLVE: Swelling of the face/tongue/throat, SOB, or low BP? Yes Sudden or severe rash/hives, skin peeling, or the inside of the mouth or nose? Yes Did it require medical treatment? Yes When did it last happen? 66 years old If all above answers are "NO", may proceed with cephalosporin use.    Aspirin Other (See Comments)     GI Bleed   Beta Adrenergic Blockers Other (See Comments)    REACTION: decreased libido   Citalopram Nausea And Vomiting   Nsaids Other (See Comments)     GI Upset   Other Hives and Itching    Walnuts    Plaquenil [Hydroxychloroquine Sulfate] Other (See Comments)    Tachycardia   Prochlorperazine Edisylate Other (See Comments)     Stroke like symptoms   Sulfonamide Derivatives Other (See Comments)    Unknown allergic reaction   Amitriptyline Hcl Palpitations and Other (See Comments)     increased heart rate     Quit smoking:  It is highly recommended that all people especially deals with  diabetes to quit smoking or stay away from smoking, avoid secondhand smoking.   Vaccines:  Also highly recommended update your vaccines requirement, including SARS-CoV-2 , yearly  flu vaccine and pneumonia vaccine at least every 5 years.      Exercise: If you are able: 30 -60 minutes a day, 4 days a week, or 150 minutes a week.  The longer the better.  Combine stretch, strength, and aerobic activities.  If you were told in the past that you have high risk for cardiovascular diseases, you may seek evaluation by your heart doctor prior to initiating moderate to intense exercise programs.    One other important lifestyle recommendation is to ensure adequate sleep - at least 6-7 hours of uninterrupted sleep at night.  Procedures /Studies:   DG Chest 2 View  Result Date: 11/09/2021 CLINICAL DATA:  short of breath EXAM: CHEST - 2 VIEW COMPARISON:  Chest x-ray 12/05/2020, CT chest 05/08/2014 FINDINGS: The heart and mediastinal contours are unchanged. Aortic calcification. No focal consolidation. No pulmonary edema. No pleural effusion. No pneumothorax. No acute osseous abnormality. IMPRESSION: No active cardiopulmonary disease. Electronically Signed   By: Iven Finn M.D.   On: 11/09/2021 15:02    Subjective:   Patient was seen and examined 11/11/2021, 10:42 AM Patient stable today. No acute distress.  No issues overnight Stable for discharge.  Discharge Exam:    Vitals:   11/10/21 1943 11/10/21 2125 11/11/21 0530 11/11/21 0807  BP:  (!) 127/48 111/61   Pulse:  (!) 103 68   Resp:  19 18   Temp:  98.3 F (36.8 C) 98.1 F (36.7 C)   TempSrc:  Oral Oral   SpO2: 97% 100% 97% 100%  Weight:      Height:        General: Pt lying comfortably in bed & appears in no obvious distress. Cardiovascular: S1 & S2 heard, RRR, S1/S2 +. No murmurs, rubs, gallops or clicks. No JVD or pedal edema. Respiratory: Clear to auscultation without wheezing, rhonchi or crackles. No increased work of  breathing. Abdominal:  Non-distended, non-tender & soft. No organomegaly or masses appreciated. Normal bowel sounds heard. CNS: Alert and oriented. No focal deficits. Extremities: no edema, no  cyanosis      The results of significant diagnostics from this hospitalization (including imaging, microbiology, ancillary and laboratory) are listed below for reference.      Microbiology:   Recent Results (from the past 240 hour(s))  Resp Panel by RT-PCR (Flu A&B, Covid) Nasopharyngeal Swab     Status: None   Collection Time: 11/09/21  3:14 PM   Specimen: Nasopharyngeal Swab; Nasopharyngeal(NP) swabs in vial transport medium  Result Value Ref Range Status   SARS Coronavirus 2 by RT PCR NEGATIVE NEGATIVE Final    Comment: (NOTE) SARS-CoV-2 target nucleic acids are NOT DETECTED.  The SARS-CoV-2 RNA is generally detectable in upper respiratory specimens during the acute phase of infection. The lowest concentration of SARS-CoV-2 viral copies this assay can detect is 138 copies/mL. A negative result does not preclude SARS-Cov-2 infection and should not be used as the sole basis for treatment or other patient management decisions. A negative result may occur with  improper specimen collection/handling, submission of specimen other than nasopharyngeal swab, presence of viral mutation(s) within the areas targeted by this assay, and inadequate number of viral copies(<138 copies/mL). A negative result must be combined with clinical observations, patient history, and epidemiological information. The expected result is Negative.  Fact Sheet for Patients:  EntrepreneurPulse.com.au  Fact Sheet for Healthcare Providers:  IncredibleEmployment.be  This test is no t yet approved or cleared by the [place] FDA and  has been authorized for detection and/or diagnosis of SARS-CoV-2 by FDA under an Emergency Use Authorization (EUA). This EUA will remain  in effect  (meaning this test can be used) for the duration of the COVID-19 declaration under Section 564(b)(1) of the Act, 21 U.S.C.section 360bbb-3(b)(1), unless the authorization is terminated  or revoked sooner.       Influenza A by PCR NEGATIVE NEGATIVE Final   Influenza B by PCR NEGATIVE NEGATIVE Final    Comment: (NOTE) The Xpert Xpress SARS-CoV-2/FLU/RSV plus assay is intended as an aid in the diagnosis of influenza from Nasopharyngeal swab specimens and should not be used as a sole basis for treatment. Nasal washings and aspirates are unacceptable for Xpert Xpress SARS-CoV-2/FLU/RSV testing.  Fact Sheet for Patients: EntrepreneurPulse.com.au  Fact Sheet for Healthcare Providers: IncredibleEmployment.be  This test is not yet approved or cleared by the [place] FDA and has been authorized for detection and/or diagnosis of SARS-CoV-2 by FDA under an Emergency Use Authorization (EUA). This EUA will remain in effect (meaning this test can be used) for the duration of the COVID-19 declaration under Section 564(b)(1) of the Act, 21 U.S.C. section 360bbb-3(b)(1), unless the authorization is terminated or revoked.  Performed at [facility], [address] [place], [place] [address]   Blood culture (routine x 2)     Status: None (Preliminary result)   Collection Time: 11/09/21  7:57 PM   Specimen: BLOOD RIGHT ARM  Result Value Ref Range Status   Specimen Description BLOOD RIGHT ARM  Final   Special Requests   Final    BOTTLES DRAWN AEROBIC AND ANAEROBIC Blood Culture adequate volume   Culture   Final    NO GROWTH 2 DAYS Performed at [facility], [address] [place], [place] [address]    Report Status PENDING  Incomplete  Blood culture (routine x 2)     Status: None (Preliminary result)   Collection Time: 11/09/21  7:57 PM   Specimen: BLOOD LEFT ARM  Result Value Ref Range Status   Specimen Description BLOOD LEFT ARM  Final   Special  Requests   Final    BOTTLES DRAWN AEROBIC AND ANAEROBIC Blood Culture adequate volume   Culture   Final    NO GROWTH 2 DAYS Performed at [facility], [address] [place], [place] [address]    Report Status PENDING  Incomplete  Respiratory (~20 pathogens) panel by PCR     Status: None   Collection Time: 11/09/21 10:35 PM   Specimen: Nasopharyngeal Swab; Respiratory  Result Value Ref Range Status   Adenovirus NOT DETECTED NOT DETECTED Final   Coronavirus 229E NOT DETECTED NOT DETECTED Final    Comment: (NOTE) The Coronavirus on the Respiratory Panel, DOES NOT test for the novel  Coronavirus (2019 nCoV)    Coronavirus HKU1 NOT DETECTED NOT DETECTED Final   Coronavirus NL63 NOT DETECTED NOT DETECTED Final   Coronavirus OC43 NOT DETECTED NOT DETECTED Final   Metapneumovirus NOT DETECTED NOT DETECTED Final   Rhinovirus / Enterovirus NOT DETECTED NOT DETECTED Final   Influenza A NOT DETECTED NOT DETECTED Final   Influenza B NOT DETECTED NOT DETECTED Final   Parainfluenza Virus 1 NOT DETECTED NOT DETECTED Final   Parainfluenza Virus 2 NOT DETECTED NOT DETECTED Final   Parainfluenza Virus 3 NOT DETECTED NOT DETECTED Final   Parainfluenza Virus 4 NOT DETECTED NOT DETECTED Final   Respiratory Syncytial Virus NOT DETECTED NOT DETECTED Final   Bordetella pertussis NOT DETECTED NOT DETECTED Final   Bordetella Parapertussis NOT DETECTED NOT DETECTED Final   Chlamydophila pneumoniae NOT DETECTED NOT DETECTED Final   Mycoplasma pneumoniae NOT DETECTED NOT DETECTED Final    Comment: Performed at [facility], [place]. [address] [place], [place] [address]     Labs:   CBC: Recent Labs  Lab 11/09/21 1734 11/10/21 0606  WBC 15.2* 7.8  NEUTROABS 9.1*  --   HGB 13.3 12.4  HCT 40.5 38.3  MCV 97.6 98.0  PLT 194 262   Basic Metabolic Panel: Recent Labs  Lab 11/09/21 1734 11/10/21 0606  NA 140 139  K 3.8 3.9  CL 105 107  CO2 25 21*  GLUCOSE 130* 229*  BUN 8 12  CREATININE 0.69  0.72  CALCIUM 9.3 9.5   Liver Function Tests: Recent Labs  Lab 11/09/21 1734  AST 42*  ALT 24  ALKPHOS 49  BILITOT 0.9  PROT 7.4  ALBUMIN 3.9   BNP (last 3 results) No results for input(s): BNP in the last 8760 hours. Cardiac Enzymes: No results for input(s): CKTOTAL, CKMB, CKMBINDEX, TROPONINI in the last 168 hours. CBG: No results for input(s): GLUCAP in the last 168 hours. Hgb A1c No results for input(s): HGBA1C in the last 72 hours. Lipid Profile No results for input(s): CHOL, HDL, LDLCALC, TRIG, CHOLHDL, LDLDIRECT in the last 72 hours. Thyroid function studies No results for input(s): TSH, T4TOTAL, T3FREE, THYROIDAB in the last 72 hours.  Invalid input(s): FREET3 Anemia work up No results for input(s): VITAMINB12, FOLATE, FERRITIN, TIBC, IRON, RETICCTPCT in the last 72 hours. Urinalysis    Component Value Date/Time   COLORURINE YELLOW 06/16/2019 1549   APPEARANCEUR CLEAR 06/16/2019 1549   LABSPEC 1.008 06/16/2019 1549   PHURINE 6.0 06/16/2019 1549   GLUCOSEU NEGATIVE 06/16/2019 1549   HGBUR NEGATIVE 06/16/2019 1549   BILIRUBINUR Negative 03/29/2021 1450   KETONESUR NEGATIVE 06/16/2019 1549   PROTEINUR Positive (A) 03/29/2021 1450   PROTEINUR NEGATIVE 06/16/2019 1549   UROBILINOGEN 1.0 03/29/2021 1450   UROBILINOGEN 0.2 08/21/2013 2259   NITRITE Negative  03/29/2021 1450   NITRITE NEGATIVE 06/16/2019 1549   LEUKOCYTESUR Large (3+) (A) 03/29/2021 1450   LEUKOCYTESUR TRACE (A) 06/16/2019 1549         Time coordinating discharge: Over 45 minutes  SIGNED: Deatra James, MD, FACP, [facility]. [facility],  Please use [email or web address] to Page If 7PM-7AM, please contact night-coverage [email or web address],  11/11/2021, 10:42 AM

## 2021-11-13 ENCOUNTER — Telehealth: Payer: Self-pay

## 2021-11-13 NOTE — Telephone Encounter (Signed)
Transition Care Management Unsuccessful Follow-up Telephone Call  Date of discharge and from where:  [facility] 11[facility]  Attempts:  1st Attempt  Reason for unsuccessful TCM follow-up call:  Left voice message

## 2021-11-14 LAB — CULTURE, BLOOD (ROUTINE X 2)
Culture: NO GROWTH
Culture: NO GROWTH
Special Requests: ADEQUATE
Special Requests: ADEQUATE

## 2021-11-21 ENCOUNTER — Other Ambulatory Visit: Payer: Self-pay

## 2021-11-22 ENCOUNTER — Encounter: Payer: Self-pay | Admitting: Family Medicine

## 2021-11-22 ENCOUNTER — Ambulatory Visit (INDEPENDENT_AMBULATORY_CARE_PROVIDER_SITE_OTHER): Payer: Medicare HMO | Admitting: Family Medicine

## 2021-11-22 VITALS — BP 115/76 | HR 96 | Temp 99.3°F | Ht 63.0 in | Wt 201.0 lb

## 2021-11-22 DIAGNOSIS — J441 Chronic obstructive pulmonary disease with (acute) exacerbation: Secondary | ICD-10-CM

## 2021-11-22 DIAGNOSIS — J9601 Acute respiratory failure with hypoxia: Secondary | ICD-10-CM | POA: Diagnosis not present

## 2021-11-22 DIAGNOSIS — M170 Bilateral primary osteoarthritis of knee: Secondary | ICD-10-CM

## 2021-11-22 DIAGNOSIS — G894 Chronic pain syndrome: Secondary | ICD-10-CM | POA: Diagnosis not present

## 2021-11-22 MED ORDER — OXYCODONE HCL 5 MG PO TABS: ORAL_TABLET | ORAL | 0 refills | Status: DC

## 2021-11-22 NOTE — Progress Notes (Signed)
11/22/2021  CC:  Chief Complaint  Patient presents with   Hospitalization Follow-up    Patient is a 66 y.o. Caucasian female who presents for  hospital follow up, specifically [facility] face-to-face visit. Dates hospitalized: 1/5-1/7, 2023. Days since d/c from hospital: 11 days Patient was discharged from hospital to home. Reason for admission to hospital: acute hypoxic RF, COPD exacerbation.  Of note, home covid test was positive approx 9d prior to admission. Date of interactive (phone) contact with patient and/or caregiver: attempt 11/13/21.  I have reviewed patient's discharge summary plus pertinent specific notes, labs, and imaging from the hospitalization.   Progressive acute febrile respiratory illness over a 10-day period prior to presentation.  Home COVID test positive at the onset of this illness.  COVID and flu testing upon admission were negative.  Chest x-ray showed no infiltrate or other acute disease. No lab abnormalities other than mild leukocytosis on admission--this normalized on recheck in the hospital. Metabolic panel normal except glucose 229 on 11/10/2021. Treated with a as needed DuoNeb bronchodilators, supplemental oxygen, IV Solu-Medrol, azithromycin. Transitioned to prednisone and oral azith upon d/c, oxygen was >92% on RA at the time of d/c. Of note, patient does have immunosuppression due to Humira treatment for inflammatory arthritis.  CURRENTLY: She is feeling better but not quite back to her baseline regarding energy level and breathing. She feels more winded than her typical with walking across her house.  She rests and this goes back to baseline quickly.  No chest pain, no fever.  Requiring albuterol only occasionally.  Appetite and fluid intake pretty good but not optimal. Most recent humira was about 9-10 d/a.  Medication reconciliation was done today and patient took all meds as recommended by discharging hospitalist/specialist.    ROS  as above, plus--> Occ wheezing, minimal cough, no dizziness, no HAs, no rashes, no melena/hematochezia.  No polyuria or polydipsia.  Chronic arthritis pain is stable.  No focal weakness, paresthesias, or tremors.  No acute vision or hearing abnormalities.  No dysuria or unusual/new urinary urgency or frequency.  No recent changes in lower legs. No n/v/d or abd pain.  No palpitations.    PMH:  Past Medical History:  Diagnosis Date   Aortic valve stenosis    "very mild"   Atypical chest pain 2007; 2015   cardiolyte neg, echo nl, cath showed mild/nonobstructive LAD disease   Bilateral primary osteoarthritis of knee 06/30/2012   Right >L.  Right unicompartmental knee arthroplasty 10/25/18   CAD (coronary artery disease)    Chronic LBP    Multiple surgeries   COPD (chronic obstructive pulmonary disease) ([place])    COVID-19 virus infection 06/02/2019   Eval at [facility] ED and d/c'd home.  Admitted 06/12/19 with hypoxic RF and bilat infiltrates.   DDD (degenerative disc disease)    spinal stenosis   Elevated liver enzymes    2021 after starting methotrexate. 2022->rheum d/c'd her methotrexate   Her abd u/s 12/05/20 showed fatty liver, o/w normal.   Fatty liver    11/2020 liver u/s   Gastritis    H pylori NEG on EGD 02/2021   GERD (gastroesophageal reflux disease)    History of adenomatous polyp of colon 06/2021   2022, recall 5 yrs   History of GI bleed    NSAIDS   Hyperlipidemia    mixed   Microscopic hematuria    full w/ u unrevealing X 2   Normal nuclear stress test 11/11 and 06/2014   negative, EF  normal   Palpitations 2006   48H holter neg   Positive occult stool blood test    iFOB positive 04/2021->colonoscopy showed multiple adenomatous polyps and nonbleeding int hem.   Prediabetes    Highest A1c 6.1% as of 03/2017   RBBB (right bundle branch block)    Recurrent UTI    Seronegative rheumatoid arthritis ([place]) 10/2019   multiple sites->pred started, methotrex started 10/2019; rheum to  add humira as of 03/2020   Tobacco dependence in remission    Quit fall 2015.      PSH:  Past Surgical History:  Procedure Laterality Date   ABDOMINAL HYSTERECTOMY  1997   DUB   APPENDECTOMY  1984   BIOPSY  02/14/2021   Procedure: BIOPSY;  Surgeon: Eloise Harman, DO;  [facility];  Service: Endoscopy;;   CARDIAC CATHETERIZATION  10/09/2005   no CAD, mildly elevated LVEDP, normal LV function (Dr. Gerrie Nordmann)   CARDIAC CATHETERIZATION N/A 05/16/2015   Mild non-obstructive CAD--med mgmt.  Procedure: Left Heart Cath and Coronary Angiography;  Surgeon: Pixie Casino, MD;  Location: [place] [facility];  Service: Cardiovascular;  Laterality: N/A;   CARDIOVASCULAR STRESS TEST  06/2014   normal lexiscan NST   CARDIOVASCULAR STRESS TEST  2006   persantine - no ischemia, low risk    COLONOSCOPY W/ POLYPECTOMY  06/12/2021   multiple adenomas--recall 3-5 yrs   COLONOSCOPY WITH PROPOFOL N/A 06/12/2021   Procedure: COLONOSCOPY WITH PROPOFOL;  Surgeon: Eloise Harman, DO;  [facility];  Service: Endoscopy;  Laterality: N/A;  8:30am   ESOPHAGOGASTRODUODENOSCOPY  02/14/2021   Gastritis ( H pylori NEG) w/out bleeding   ESOPHAGOGASTRODUODENOSCOPY (EGD) WITH PROPOFOL N/A 02/14/2021   Procedure: ESOPHAGOGASTRODUODENOSCOPY (EGD) WITH PROPOFOL;  Surgeon: Eloise Harman, DO;  [facility];  Service: Endoscopy;  Laterality: N/A;  AM   KNEE ARTHROSCOPY Bilateral    left wrist ganglion cyst excision  2010   [place]   right iliac crest bone graft+metal instrumentation; 2005 metal removal and decompression, 2011 lumbar decompression 4-11, then stabilization/ instrumentation done 09-19-10: L2,L3, L5 left and L2 , L3, L4 right pedical remnant L4 left embedded. Left iliac crest bone graft-- Dr Zollie Pee SPINE SURGERY  02/10/2016   Dr. Velna Ochs: lumbar decompression, instrumentation removal, and fusion exploration--HELPED her a lot, esp her radicular  leg pains.   OOPHORECTOMY Right    cyst   PARTIAL KNEE ARTHROPLASTY Right 11/04/2018   Procedure: UNICOMPARTMENTAL KNEE;  Surgeon: Renette Butters, MD;  Location: [facility];  Service: Orthopedics;  Laterality: Right;  Adductor Block   POLYPECTOMY  06/12/2021   Procedure: POLYPECTOMY;  Surgeon: Eloise Harman, DO;  [facility];  Service: Endoscopy;;   TONSILLECTOMY  66 yrs old   TRANSTHORACIC ECHOCARDIOGRAM  2006   EF=>55%, trace MR, mild TR, trace AV regurg, trace pulm valve regurg     MEDS:  Outpatient Medications Prior to Visit  Medication Sig Dispense Refill   promethazine (PHENERGAN) 12.5 MG tablet 1-2 tabs po q6h prn nausea 20 tablet 2   acetaminophen (TYLENOL) 500 MG tablet Take 500 mg by mouth every 6 (six) hours as needed for fever.     Adalimumab (HUMIRA) 40 MG/0.8ML PSKT Inject 40 mg into the skin every 14 (fourteen) days.     albuterol (VENTOLIN HFA) 108 (90 Base) MCG/ACT inhaler Inhale 2 puffs into the lungs every 4 (four) hours as needed for wheezing or shortness  of breath (bronchitis). Reported on 03/12/2016 18 g 1   Calcium Carb-Cholecalciferol (CALCIUM/VITAMIN D PO) Take 1 tablet by mouth daily.     clopidogrel (PLAVIX) 75 MG tablet TAKE 1 TABLET(75 MG) BY MOUTH DAILY (Patient taking differently: Take 75 mg by mouth daily.) 90 tablet 1   cyclobenzaprine (FLEXERIL) 10 MG tablet TAKE 1 TABLET BY MOUTH EVERY 8 HOURS AS NEEDED 90 tablet 1   fenofibrate 160 MG tablet TAKE 1 TABLET BY MOUTH EVERY DAY WITH FOOD 90 tablet 1   folic acid (FOLVITE) 1 MG tablet Take 1 mg by mouth daily.      lisinopril (ZESTRIL) 5 MG tablet TAKE 1 TABLET(5 MG) BY MOUTH DAILY Strength: 5 mg 90 tablet 3   LORazepam (ATIVAN) 1 MG tablet TAKE 1 TABLET(1 MG) BY MOUTH TWICE DAILY (Patient taking differently: Take 1 mg by mouth 2 (two) times daily.) 60 tablet 5   nitroGLYCERIN (NITROSTAT) 0.4 MG SL tablet Place 1 tablet (0.4 mg total) under the tongue every 5 (five) minutes as needed for chest pain  (MAX 3 doses.). (Patient not taking: Reported on 11/22/2021) 10 tablet 1   oxyCODONE (OXY IR/ROXICODONE) 5 MG immediate release tablet 1-2 po tid prn pain 180 tablet 0   pantoprazole (PROTONIX) 40 MG tablet Take 1 tablet (40 mg total) by mouth 2 (two) times daily. 60 tablet 5   rosuvastatin (CRESTOR) 40 MG tablet TAKE 1 TABLET(40 MG) BY MOUTH DAILY (Patient taking differently: Take 40 mg by mouth daily.) 90 tablet 1   methylPREDNISolone (MEDROL DOSEPAK) 4 MG TBPK tablet Medrol Dosepak take as instructed 21 tablet 0   No facility-administered medications prior to visit.    Physical Exam EXAM:  Vitals with BMI 11/22/2021 11/11/2021 11/10/2021  Height 5\' 3"  - -  Weight 201 lbs - -  BMI 71.24 - -  Systolic 580 998 338  Diastolic 76 61 48  Pulse 96 68 103   02 sat 92% RA today Gen: alert, tired-appearing but not acutely ill. AFFECT: pleasant, lucid thought and speech. CV: RRR, no m/r/g.   LUNGS: CTA bilat, nonlabored resps, good aeration in all lung fields. EXT: no clubbing or cyanosis.  no edema.    Pertinent labs/imaging Last CBC Lab Results  Component Value Date   WBC 7.8 11/10/2021   HGB 12.4 11/10/2021   HCT 38.3 11/10/2021   MCV 98.0 11/10/2021   MCH 31.7 11/10/2021   RDW 12.8 11/10/2021   PLT 163 25/03/3975   Last metabolic panel Lab Results  Component Value Date   GLUCOSE 229 (H) 11/10/2021   NA 139 11/10/2021   K 3.9 11/10/2021   CL 107 11/10/2021   CO2 21 (L) 11/10/2021   BUN 12 11/10/2021   CREATININE 0.72 11/10/2021   GFRNONAA >60 11/10/2021   CALCIUM 9.5 11/10/2021   PROT 7.4 11/09/2021   ALBUMIN 3.9 11/09/2021   LABGLOB 2.5 01/05/2021   AGRATIO 1.8 01/05/2021   BILITOT 0.9 11/09/2021   ALKPHOS 49 11/09/2021   AST 42 (H) 11/09/2021   ALT 24 11/09/2021   ANIONGAP 11 11/10/2021   Last hemoglobin A1c Lab Results  Component Value Date   HGBA1C 5.8 04/19/2021   ASSESSMENT/PLAN:  Acute hypoxic respiratory failure due to respiratory illness/ COPD  exacerbation. She is much better, no changes made today. No labs indicated today.  Chronic pain syndrome--fibromyalgia plus bilateral knee osteoarthritis.: All is stable. Continue oxycodone 5 mg, 1-2 3 times daily as needed, #180--prescription sent in today. Trolled substance contract up-to-date. Will  do urine drug screen at next follow-up in 3 months  Medical decision making of moderate complexity was utilized today.  FOLLOW UP:  3 mo CPE and RCI  Signed:  Crissie Sickles, MD           11/22/2021

## 2021-11-25 ENCOUNTER — Other Ambulatory Visit: Payer: Self-pay | Admitting: Family Medicine

## 2021-11-27 ENCOUNTER — Other Ambulatory Visit: Payer: Self-pay | Admitting: Family Medicine

## 2022-01-01 ENCOUNTER — Other Ambulatory Visit: Payer: Self-pay

## 2022-01-01 MED ORDER — OXYCODONE HCL 5 MG PO TABS: ORAL_TABLET | ORAL | 0 refills | Status: DC

## 2022-01-01 NOTE — Telephone Encounter (Signed)
Requesting: Oxycodone Contract: 01/16/21 UDS:12/28/19 Last Visit: 11/22/21 Next Visit: 01/17/22 Last Refill:11/22/21(180,0)  Please Advise. Med pending

## 2022-01-01 NOTE — Telephone Encounter (Signed)
Patient refill request.  [facility] - [place]  oxyCODONE (OXY IR/ROXICODONE) 5 MG immediate release tablet [[id number]]

## 2022-01-02 NOTE — Telephone Encounter (Signed)
LM for pt regarding medication ?

## 2022-01-02 NOTE — Telephone Encounter (Signed)
Pt advised refill sent. °

## 2022-01-11 DIAGNOSIS — M79643 Pain in unspecified hand: Secondary | ICD-10-CM | POA: Diagnosis not present

## 2022-01-11 DIAGNOSIS — M1990 Unspecified osteoarthritis, unspecified site: Secondary | ICD-10-CM | POA: Diagnosis not present

## 2022-01-11 DIAGNOSIS — R748 Abnormal levels of other serum enzymes: Secondary | ICD-10-CM | POA: Diagnosis not present

## 2022-01-11 DIAGNOSIS — I2510 Atherosclerotic heart disease of native coronary artery without angina pectoris: Secondary | ICD-10-CM | POA: Diagnosis not present

## 2022-01-11 DIAGNOSIS — M549 Dorsalgia, unspecified: Secondary | ICD-10-CM | POA: Diagnosis not present

## 2022-01-11 DIAGNOSIS — J449 Chronic obstructive pulmonary disease, unspecified: Secondary | ICD-10-CM | POA: Diagnosis not present

## 2022-01-11 DIAGNOSIS — M0609 Rheumatoid arthritis without rheumatoid factor, multiple sites: Secondary | ICD-10-CM | POA: Diagnosis not present

## 2022-01-11 DIAGNOSIS — E669 Obesity, unspecified: Secondary | ICD-10-CM | POA: Diagnosis not present

## 2022-01-11 DIAGNOSIS — Z79899 Other long term (current) drug therapy: Secondary | ICD-10-CM | POA: Diagnosis not present

## 2022-01-12 LAB — HEPATIC FUNCTION PANEL
ALT: 73 U/L — AB (ref 7–35)
AST: 94 — AB (ref 13–35)
Alkaline Phosphatase: 69 (ref 25–125)
Bilirubin, Total: 0.8

## 2022-01-12 LAB — BASIC METABOLIC PANEL
BUN: 10 (ref 4–21)
CO2: 33 — AB (ref 13–22)
Chloride: 103 (ref 99–108)
Creatinine: 0.8 (ref 0.5–1.1)
Glucose: 128
Potassium: 3.7 mEq/L (ref 3.5–5.1)
Sodium: 139 (ref 137–147)

## 2022-01-12 LAB — COMPREHENSIVE METABOLIC PANEL
Albumin: 4 (ref 3.5–5.0)
Calcium: 9.5 (ref 8.7–10.7)
Globulin: 2.9
eGFR: 75

## 2022-01-12 LAB — CBC AND DIFFERENTIAL
HCT: 41 (ref 36–46)
Hemoglobin: 13.9 (ref 12.0–16.0)
Neutrophils Absolute: 4.9
Platelets: 147 10*3/uL — AB (ref 150–400)
WBC: 9.2

## 2022-01-12 LAB — CBC: RBC: 4.46 (ref 3.87–5.11)

## 2022-01-12 LAB — POCT ERYTHROCYTE SEDIMENTATION RATE, NON-AUTOMATED: Sed Rate: 6

## 2022-01-17 ENCOUNTER — Ambulatory Visit (INDEPENDENT_AMBULATORY_CARE_PROVIDER_SITE_OTHER): Payer: Medicare HMO | Admitting: Family Medicine

## 2022-01-17 ENCOUNTER — Other Ambulatory Visit: Payer: Self-pay

## 2022-01-17 ENCOUNTER — Encounter: Payer: Self-pay | Admitting: Family Medicine

## 2022-01-17 VITALS — BP 117/73 | HR 90 | Temp 98.2°F | Ht 63.0 in | Wt 205.4 lb

## 2022-01-17 DIAGNOSIS — F411 Generalized anxiety disorder: Secondary | ICD-10-CM

## 2022-01-17 DIAGNOSIS — Z8616 Personal history of COVID-19: Secondary | ICD-10-CM | POA: Diagnosis not present

## 2022-01-17 DIAGNOSIS — Z23 Encounter for immunization: Secondary | ICD-10-CM | POA: Diagnosis not present

## 2022-01-17 DIAGNOSIS — R0902 Hypoxemia: Secondary | ICD-10-CM

## 2022-01-17 DIAGNOSIS — Z79899 Other long term (current) drug therapy: Secondary | ICD-10-CM

## 2022-01-17 DIAGNOSIS — E782 Mixed hyperlipidemia: Secondary | ICD-10-CM

## 2022-01-17 DIAGNOSIS — J449 Chronic obstructive pulmonary disease, unspecified: Secondary | ICD-10-CM

## 2022-01-17 DIAGNOSIS — G894 Chronic pain syndrome: Secondary | ICD-10-CM | POA: Diagnosis not present

## 2022-01-17 DIAGNOSIS — R7303 Prediabetes: Secondary | ICD-10-CM

## 2022-01-17 MED ORDER — ZOSTER VAC RECOMB ADJUVANTED 50 MCG/0.5ML IM SUSR: 0.5000 mL | Freq: Once | INTRAMUSCULAR | 1 refills | Status: AC

## 2022-01-17 MED ORDER — OXYCODONE HCL 5 MG PO TABS: ORAL_TABLET | ORAL | 0 refills | Status: DC

## 2022-01-17 MED ORDER — TETANUS-DIPHTH-ACELL PERTUSSIS 5-2-15.5 LF-MCG/0.5 IM SUSP: 0.5000 mL | Freq: Once | INTRAMUSCULAR | 0 refills | Status: AC

## 2022-01-17 NOTE — Progress Notes (Signed)
Office Note ?01/17/2022 ? ?CC:  ?Chief Complaint  ?Patient presents with  ? Pain  ?  Chronic pain  ? ? ?HPI:  ?Patient is a 66 y.o. female who is here for follow-up chronic pain syndrome, NASH, COPD. ?I last saw her 11/22/21. ?A/P as of that visit: ?"Acute hypoxic respiratory failure due to respiratory illness/ COPD exacerbation. ?She is much better, no changes made today. ?No labs indicated today. ?  ?Chronic pain syndrome--fibromyalgia plus bilateral knee osteoarthritis.: All is stable. ?Continue oxycodone 5 mg, 1-2 3 times daily as needed, #180--prescription sent in today. ?Trolled substance contract up-to-date. ?Will do urine drug screen at next follow-up in 3 months" ? ?INTERIM HX: ? ?Reports that since having hospitalization for COVID induced respiratory failure in January of this year she has been monitoring her oxygen level and it has periodically dropped down to the 70s to 80s at night and occasionally in the daytime into the 80s.  She reports no change in her chronic level of dyspnea on exertion.  No problem lately with any wheezing or cough. ?She did not monitor her home oxygen much prior to January. ? ?Chronic pain syndrome: ?Indication for chronic opioid: chronic bilat LBP w/out sciatica, and chronic pain from bilat osteoarthritis knees.  Also LBP and bilat shoulder pain.  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  She remains on Humira and is doing well on this.  She still takes 1-2 oxycodone 3 times a day and this sufficiently helps control her pain to allow for maximum functioning and quality of life. ?Medication and dose: oxycodone '5mg'$ , 1-2 tid prn. ?# pills per month: #180. ?PMP AWARE reviewed today: most recent rx for lorazepam was filled 01/06/2022, #60, rx by me ?Most recent oxycodone prescription filled 01/02/2022, #180, Rx by me. ?No red flags. ? ?Her anxiety level is stable with lorazepam twice a day and she has been on this long-term. ? ?ROS as above, plus--> no fevers,  no CP,  no dizziness, no HAs, no rashes, no melena/hematochezia.  No polyuria or polydipsia.    No focal weakness, paresthesias, or tremors.  No acute vision or hearing abnormalities.  No dysuria or unusual/new urinary urgency or frequency.  No recent changes in lower legs. ?No n/v/d or abd pain.  No palpitations.   ? ?Past Medical History:  ?Diagnosis Date  ? Aortic valve stenosis   ? "very mild"  ? Atypical chest pain 2007; 2015  ? cardiolyte neg, echo nl, cath showed mild/nonobstructive LAD disease  ? Bilateral primary osteoarthritis of knee 06/30/2012  ? Right >L.  Right unicompartmental knee arthroplasty 10/25/18  ? CAD (coronary artery disease)   ? Chronic LBP   ? Multiple surgeries  ? COPD (chronic obstructive pulmonary disease) ([place])   ? COVID-19 virus infection 06/02/2019  ? Eval at [facility] ED and d/c'd home.  Admitted 06/12/19 with hypoxic RF and bilat infiltrates.  ? DDD (degenerative disc disease)   ? spinal stenosis  ? Elevated liver enzymes   ? 2021 after starting methotrexate. 2022->rheum d/c'd her methotrexate   Her abd u/s 12/05/20 showed fatty liver, o/w normal.  ? Fatty liver   ? 11/2020 liver u/s  ? Gastritis   ? H pylori NEG on EGD 02/2021  ? GERD (gastroesophageal reflux disease)   ? History of adenomatous polyp of colon 06/2021  ? 2022, recall 5 yrs  ? History of GI bleed   ? NSAIDS  ? Hyperlipidemia   ? mixed  ? Microscopic hematuria   ?  full w/ u unrevealing X 2  ? Normal nuclear stress test 11/11 and 06/2014  ? negative, EF normal  ? Palpitations 2006  ? 48H holter neg  ? Positive occult stool blood test   ? iFOB positive 04/2021->colonoscopy showed multiple adenomatous polyps and nonbleeding int hem.  ? Prediabetes   ? Highest A1c 6.1% as of 03/2017  ? RBBB (right bundle branch block)   ? Recurrent UTI   ? Seronegative rheumatoid arthritis ([place]) 10/2019  ? multiple sites->pred started, methotrex started 10/2019; rheum to add humira as of 03/2020  ? Tobacco dependence in remission   ? Quit fall 2015.     ? ? ?Past Surgical History:  ?Procedure Laterality Date  ? ABDOMINAL HYSTERECTOMY  1997  ? DUB  ? APPENDECTOMY  1984  ? BIOPSY  02/14/2021  ? Procedure: BIOPSY;  Surgeon: Eloise Harman, DO;  [facility];  Service: Endoscopy;;  ? CARDIAC CATHETERIZATION  10/09/2005  ? no CAD, mildly elevated LVEDP, normal LV function (Dr. Gerrie Nordmann)  ? CARDIAC CATHETERIZATION N/A 05/16/2015  ? Mild non-obstructive CAD--med mgmt.  Procedure: Left Heart Cath and Coronary Angiography;  Surgeon: Pixie Casino, MD;  Location: [place] [facility];  Service: Cardiovascular;  Laterality: N/A;  ? CARDIOVASCULAR STRESS TEST  06/2014  ? normal lexiscan NST  ? CARDIOVASCULAR STRESS TEST  2006  ? persantine - no ischemia, low risk   ? COLONOSCOPY W/ POLYPECTOMY  06/12/2021  ? multiple adenomas--recall 3-5 yrs  ? COLONOSCOPY WITH PROPOFOL N/A 06/12/2021  ? Procedure: COLONOSCOPY WITH PROPOFOL;  Surgeon: Eloise Harman, DO;  [facility];  Service: Endoscopy;  Laterality: N/A;  8:30am  ? ESOPHAGOGASTRODUODENOSCOPY  02/14/2021  ? Gastritis ( H pylori NEG) w/out bleeding  ? ESOPHAGOGASTRODUODENOSCOPY (EGD) WITH PROPOFOL N/A 02/14/2021  ? Procedure: ESOPHAGOGASTRODUODENOSCOPY (EGD) WITH PROPOFOL;  Surgeon: Eloise Harman, DO;  [facility];  Service: Endoscopy;  Laterality: N/A;  AM  ? KNEE ARTHROSCOPY Bilateral   ? left wrist ganglion cyst excision  2010  ? [place]  ? right iliac crest bone graft+metal instrumentation; 2005 metal removal and decompression, 2011 lumbar decompression 4-11, then stabilization/ instrumentation done 09-19-10: L2,L3, L5 left and L2 , L3, L4 right pedical remnant L4 left embedded. Left iliac crest bone graft-- Dr Velna Ochs  ? LUMBAR SPINE SURGERY  02/10/2016  ? Dr. Velna Ochs: lumbar decompression, instrumentation removal, and fusion exploration--HELPED her a lot, esp her radicular leg pains.  ? OOPHORECTOMY Right   ? cyst  ? PARTIAL KNEE ARTHROPLASTY Right 11/04/2018   ? Procedure: UNICOMPARTMENTAL KNEE;  Surgeon: Renette Butters, MD;  [facility];  Service: Orthopedics;  Laterality: Right;  Adductor Block  ? POLYPECTOMY  06/12/2021  ? Procedure: POLYPECTOMY;  Surgeon: Eloise Harman, DO;  [facility];  Service: Endoscopy;;  ? TONSILLECTOMY  66 yrs old  ? TRANSTHORACIC ECHOCARDIOGRAM  2006  ? EF=>55%, trace MR, mild TR, trace AV regurg, trace pulm valve regurg   ? ? ?Family History  ?Problem Relation Age of Onset  ? Heart Problems Mother   ?     and thyroid problems  ? Hyperlipidemia Mother   ? Diabetes Mother   ? Breast cancer Mother   ? Heart failure Father   ?     CHF, heart attack, diabetes, hyperlipidemia  ? Heart disease Brother   ?     back problems  ? Hypertension Brother   ? Kidney  failure Maternal Grandmother   ? Stroke Maternal Grandfather   ? Cancer Paternal Grandmother   ?     mouth - snuff  ? Heart attack Paternal Grandfather   ?     stroke, HTN  ? Hyperlipidemia Brother   ? Heart attack Brother   ? Cancer Brother   ? Coronary artery disease Neg Hx   ? ? ?Social History  ? ?Socioeconomic History  ? Marital status: Married  ?  Spouse name: Not on file  ? Number of children: Not on file  ? Years of education: Not on file  ? Highest education level: GED or equivalent  ?Occupational History  ? Not on file  ?Tobacco Use  ? Smoking status: Former  ?  Packs/day: 1.00  ?  Years: 35.00  ?  Pack years: 35.00  ?  Types: Cigarettes  ?  Quit date: 07/22/2014  ?  Years since quitting: 7.4  ? Smokeless tobacco: Never  ? Tobacco comments:  ?  down to ~2 cigarettes/daily (06/23/14) - Quit Smoking around 07/20/14!  ?Vaping Use  ? Vaping Use: Never used  ?Substance and Sexual Activity  ? Alcohol use: No  ?  Alcohol/week: 0.0 standard drinks  ? Drug use: No  ? Sexual activity: Yes  ?  Partners: Male  ?  Birth control/protection: Surgical  ?Other Topics Concern  ? Not on file  ?Social History Narrative  ? Not on file  ? ?Social Determinants of Health  ? ?Financial  Resource Strain: Low Risk   ? Difficulty of Paying Living Expenses: Not very hard  ?Food Insecurity: No Food Insecurity  ? Worried About Charity fundraiser in the Last Year: Never true  ? Ran Out of Food

## 2022-01-18 DIAGNOSIS — R7303 Prediabetes: Secondary | ICD-10-CM | POA: Diagnosis not present

## 2022-01-18 DIAGNOSIS — E782 Mixed hyperlipidemia: Secondary | ICD-10-CM | POA: Diagnosis not present

## 2022-01-18 NOTE — Addendum Note (Signed)
Addended by: Octaviano Glow on: 01/18/2022 03:39 PM ? ? Modules accepted: Orders ? ?

## 2022-01-19 LAB — LIPID PANEL
Cholesterol: 110 mg/dL (ref <200)
HDL: 39 mg/dL — ABNORMAL LOW (ref >=50)
LDL Cholesterol (Calc): 48 mg/dL (calc)
Non-HDL Cholesterol (Calc): 71 mg/dL (calc) (ref <130)
Total CHOL/HDL Ratio: 2.8 (calc) (ref <5.0)
Triglycerides: 148 mg/dL (ref <150)

## 2022-01-19 LAB — DRUG MONITORING PANEL 376104, URINE
Alphahydroxyalprazolam: NEGATIVE ng/mL (ref <25)
Alphahydroxymidazolam: NEGATIVE ng/mL (ref <50)
Alphahydroxytriazolam: NEGATIVE ng/mL (ref <50)
Aminoclonazepam: NEGATIVE ng/mL (ref <25)
Amphetamines: NEGATIVE ng/mL (ref <500)
Barbiturates: NEGATIVE ng/mL (ref <300)
Benzodiazepines: POSITIVE ng/mL — AB (ref <100)
Cocaine Metabolite: NEGATIVE ng/mL (ref <150)
Codeine: NEGATIVE ng/mL (ref <50)
Desmethyltramadol: NEGATIVE ng/mL (ref <100)
Hydrocodone: NEGATIVE ng/mL (ref <50)
Hydromorphone: NEGATIVE ng/mL (ref <50)
Hydroxyethylflurazepam: NEGATIVE ng/mL (ref <50)
Lorazepam: 487 ng/mL — ABNORMAL HIGH (ref <50)
Morphine: NEGATIVE ng/mL (ref <50)
Nordiazepam: NEGATIVE ng/mL (ref <50)
Norhydrocodone: NEGATIVE ng/mL (ref <50)
Noroxycodone: 1080 ng/mL — ABNORMAL HIGH (ref <50)
Opiates: NEGATIVE ng/mL (ref <100)
Oxazepam: NEGATIVE ng/mL (ref <50)
Oxycodone: 1239 ng/mL — ABNORMAL HIGH (ref <50)
Oxycodone: POSITIVE ng/mL — AB (ref <100)
Oxymorphone: 1584 ng/mL — ABNORMAL HIGH (ref <50)
Temazepam: NEGATIVE ng/mL (ref <50)
Tramadol: NEGATIVE ng/mL (ref <100)

## 2022-01-19 LAB — DM TEMPLATE

## 2022-01-19 LAB — HEMOGLOBIN A1C
Hgb A1c MFr Bld: 6.2 % of total Hgb — ABNORMAL HIGH (ref <5.7)
Mean Plasma Glucose: 131 mg/dL
eAG (mmol/L): 7.3 mmol/L

## 2022-01-19 NOTE — Telephone Encounter (Signed)
Patient returning call.  Please call whenever available. ?

## 2022-01-31 ENCOUNTER — Telehealth: Payer: Self-pay | Admitting: Family Medicine

## 2022-01-31 NOTE — Telephone Encounter (Signed)
Pt called about status of referral for lung specialist. ? ?I informed pt to give it some time, someone will contact her about scheduling an appt. ?  ?

## 2022-02-21 ENCOUNTER — Encounter: Payer: Medicare HMO | Admitting: Family Medicine

## 2022-02-23 ENCOUNTER — Other Ambulatory Visit: Payer: Self-pay | Admitting: Family Medicine

## 2022-02-28 ENCOUNTER — Other Ambulatory Visit: Payer: Self-pay | Admitting: Family Medicine

## 2022-03-05 ENCOUNTER — Other Ambulatory Visit: Payer: Self-pay | Admitting: Family Medicine

## 2022-03-05 ENCOUNTER — Other Ambulatory Visit: Payer: Self-pay

## 2022-03-05 NOTE — Telephone Encounter (Signed)
Patient refill request.    [facility]  [place] ? ?oxyCODONE (OXY IR/ROXICODONE) 5 MG immediate release tablet [[id number]]  ?

## 2022-03-06 NOTE — Telephone Encounter (Signed)
Patient checking on refill request.  Patient is in a lot of pain right now. ?Please follow up with patient.  ? ?Request sent yesterday 5/1 at 4pm. ? ?[place] ? ?oxyCODONE (OXY IR/ROXICODONE) 5 MG immediate release tablet [[id number] ? ? ?

## 2022-03-06 NOTE — Telephone Encounter (Signed)
Requesting: oxycodone ?Contract: 01/16/22 ?UDS: 01/17/22 ?Last Visit: 01/17/22 ?Next Visit: 04/19/22 ?Last Refill: 01/17/22(180,0) ? ?Please Advise. Med pending ? ?

## 2022-03-07 ENCOUNTER — Other Ambulatory Visit: Payer: Self-pay | Admitting: Family Medicine

## 2022-03-07 MED ORDER — OXYCODONE HCL 5 MG PO TABS: ORAL_TABLET | ORAL | 0 refills | Status: DC

## 2022-03-07 NOTE — Telephone Encounter (Signed)
Duplicate rx, please deny RF ?

## 2022-03-13 ENCOUNTER — Ambulatory Visit (INDEPENDENT_AMBULATORY_CARE_PROVIDER_SITE_OTHER): Payer: Medicare HMO | Admitting: Pulmonary Disease

## 2022-03-13 ENCOUNTER — Encounter: Payer: Self-pay | Admitting: Pulmonary Disease

## 2022-03-13 VITALS — HR 96 | Temp 98.2°F | Ht 65.0 in | Wt 202.0 lb

## 2022-03-13 DIAGNOSIS — G4734 Idiopathic sleep related nonobstructive alveolar hypoventilation: Secondary | ICD-10-CM

## 2022-03-13 DIAGNOSIS — R0609 Other forms of dyspnea: Secondary | ICD-10-CM | POA: Diagnosis not present

## 2022-03-13 DIAGNOSIS — J432 Centrilobular emphysema: Secondary | ICD-10-CM | POA: Diagnosis not present

## 2022-03-13 MED ORDER — STIOLTO RESPIMAT 2.5-2.5 MCG/ACT IN AERS: 2.0000 | INHALATION_SPRAY | Freq: Every day | RESPIRATORY_TRACT | 11 refills | Status: DC

## 2022-03-13 NOTE — Patient Instructions (Signed)
Nice to meet you ? ?If your oxygen drops at night, I am ordering 2 L nasal cannula to be worn when sleeping.  Continue to check your watch and make sure the numbers stay above 88%.  If not please contact me and I will instruct you how to increase. ? ?For your shortness of breath, I think this is likely related to some emphysema, may be some other things.  Emphysema was seen on your CT scan in 2015.  Since albuterol helps, I recommend using Stiolto 2 puffs once a day every day.  I sent a new prescription for this. ? ?I ordered a polysomnography or sleep test to evaluate for sleep apnea.  Plans I am ordering oxygen to be worn at night, this may be done in the lab or facility, it cannot be done at home.  You should be receiving a call to schedule this.  It may take [place] a few weeks.  We are behind. ? ?Return to clinic in 3 months or sooner as needed with Dr. Silas Flood ?

## 2022-03-13 NOTE — Progress Notes (Signed)
? ?'@Patient'$  ID: Toy Cookey, female    DOB: [birth year], 66 y.o.   MRN: [id number] ? ?Chief Complaint  ?Patient presents with  ? Consult  ?  Pt is here due to her oxygen dropping in the 70s at night. She states that she has never been tested for COPD but she was told that she does have it.   ? ? ?Referring provider: ?McGowen, Adrian [place], MD ? ?HPI:  ? ?66 y.o. whom we are seeing in consultation for evaluation of nocturnal hypoxemia and dyspnea on exertion.  Discharge summary 11/2021 reviewed.  Most recent PCP note reviewed. ? ?Certainly if your oxygen drops while hospitalized 11/2021.  This was for presumed COPD exacerbation.  Never had PFTs to confirm or deny the presence of COPD.  She was noted to drop her oxygen at night early morning when has been weaned off during the day.  11/10/2021 document 7 9% on room air at 3 AM, improved to 98% on 2 L.  She was not discharged on oxygen.  She had no hypoxemia during the day or with exertion at that time.  However it appears nocturnal hypoxemia was overlooked.  Since returning home, she has been wearing a smart watch.  She reports every night her oxygen saturation drops to the low 80s.  Has not been in the 70s but has been in the low 80s. ? ?She endorses dyspnea on exertion.  Present for some time.  Worse on inclines or stairs.  No time of day where things are better or worse.  No position make things better or worse.  No seasonal environmental factors she can identify to make things better or worse.  Mild improvement with albuterol.  Not using maintenance inhaler. ? ?Reviewed most recent chest x-ray 11/2021 that on my interpretation reveals clear lungs bilaterally.  Most recent cross-sectional imaging CT chest 05/2014 on review reveals mild emphysematous changes with predilection for the apices, with otherwise clear lungs on my interpretation.o ? ? ?PMH: Tobacco abuse in remission, emphysema, CAD, hypertension, GERD ?Surgical history: Abdominal hysterectomy, appendectomy, knee  arthroscopy, lumbar spine surgery, tonsillectomy ?Family history: Mother with CAD, hyperlipidemia, diabetes, breast cancer, father with CAD, CHF ?Social history: Former smoker, quit in 2015, 35-pack-year history, lives in [place] ? ? ?Questionaires / Pulmonary Flowsheets:  ? ?ACT:  ?   ? View : No data to display.  ?  ?  ?  ? ? ?MMRC: ?   ? View : No data to display.  ?  ?  ?  ? ? ?Epworth:  ?   ? View : No data to display.  ?  ?  ?  ? ? ?Tests:  ? ?FENO:  ?No results found for: NITRICOXIDE ? ?PFT: ?   ? View : No data to display.  ?  ?  ?  ? ? ?WALK:  ?   ? View : No data to display.  ?  ?  ?  ? ? ?Imaging: ?Personally reviewed and as per EMR ?No results found. ? ?Lab Results: ?Personally reviewed ?CBC ?   ?Component Value Date/Time  ? WBC 9.2 01/12/2022 0000  ? WBC 7.8 11/10/2021 0606  ? RBC 4.46 01/12/2022 0000  ? HGB 13.9 01/12/2022 0000  ? HCT 41 01/12/2022 0000  ? PLT 147 (A) 01/12/2022 0000  ? MCV 98.0 11/10/2021 0606  ? MCH 31.7 11/10/2021 0606  ? MCHC 32.4 11/10/2021 0606  ? RDW 12.8 11/10/2021 0606  ? LYMPHSABS 4.7 (H) 11/09/2021 1734  ?  MONOABS 1.2 (H) 11/09/2021 1734  ? EOSABS 0.2 11/09/2021 1734  ? BASOSABS 0.1 11/09/2021 1734  ? ? ?BMET ?   ?Component Value Date/Time  ? NA 139 01/12/2022 0000  ? K 3.7 01/12/2022 0000  ? CL 103 01/12/2022 0000  ? CO2 33 (A) 01/12/2022 0000  ? GLUCOSE 229 (H) 11/10/2021 0606  ? BUN 10 01/12/2022 0000  ? CREATININE 0.8 01/12/2022 0000  ? CREATININE 0.72 11/10/2021 0606  ? CREATININE 0.69 06/29/2011 1607  ? CALCIUM 9.5 01/12/2022 0000  ? GFRNONAA >60 11/10/2021 0606  ? GFRAA 107 09/13/2021 0000  ? ? ?BNP ?   ?Component Value Date/Time  ? BNP 38.0 06/13/2019 1726  ? ? ?ProBNP ?   ?Component Value Date/Time  ? PROBNP 36.4 05/08/2014 1551  ? ? ?Specialty Problems   ? ?  ? Pulmonary Problems  ? COPD (chronic obstructive pulmonary disease) ([place])  ? Acute exacerbation of chronic obstructive pulmonary disease (COPD) ([place])  ? Pneumonia due to COVID-19 virus  ? COPD exacerbation  ([place])  ? Hypoxia  ? ? ?Allergies  ?Allergen Reactions  ? Penicillins Anaphylaxis and Swelling  ?  DID THE REACTION INVOLVE: Swelling of the face/tongue/throat, SOB, or low BP? Yes ?Sudden or severe rash/hives, skin peeling, or the inside of the mouth or nose? Yes ?Did it require medical treatment? Yes ?When did it last happen? 66 years old ?If all above answers are "NO", may proceed with cephalosporin use. ?  ? Aspirin Other (See Comments)  ?   GI Bleed  ? Beta Adrenergic Blockers Other (See Comments)  ?  REACTION: decreased libido  ? Citalopram Nausea And Vomiting  ? Nsaids Other (See Comments)  ?   GI Upset  ? Other Hives and Itching  ?  Walnuts   ? Plaquenil [Hydroxychloroquine Sulfate] Other (See Comments)  ?  Tachycardia  ? Prochlorperazine Edisylate Other (See Comments)  ?   Stroke like symptoms  ? Sulfonamide Derivatives Other (See Comments)  ?  Unknown allergic reaction  ? Amitriptyline Hcl Palpitations and Other (See Comments)  ?   increased heart rate  ? ? ?Immunization History  ?Administered Date(s) Administered  ? Influenza Split 10/25/2011  ? Influenza, Quadrivalent, Recombinant, Inj, Pf 09/06/2020  ? Influenza, Seasonal, Injecte, Preservative Fre 10/30/2012  ? Influenza,inj,Quad PF,6+ Mos 11/12/2013, 07/14/2014, 07/14/2015, 09/13/2016, 09/05/2017, 08/21/2018, 09/24/2019  ? Influenza-Unspecified 03/19/2021  ? PNEUMOCOCCAL CONJUGATE-20 04/19/2021  ? Pneumococcal Polysaccharide-23 02/21/2012, 06/06/2017  ? Tdap 10/25/2011  ? ? ?Past Medical History:  ?Diagnosis Date  ? Aortic valve stenosis   ? "very mild"  ? Atypical chest pain 2007; 2015  ? cardiolyte neg, echo nl, cath showed mild/nonobstructive LAD disease  ? Bilateral primary osteoarthritis of knee 06/30/2012  ? Right >L.  Right unicompartmental knee arthroplasty 10/25/18  ? CAD (coronary artery disease)   ? Chronic LBP   ? Multiple surgeries  ? COPD (chronic obstructive pulmonary disease) ([place])   ? COVID-19 virus infection 06/02/2019  ? Eval at [facility]  ED and d/c'd home.  Admitted 06/12/19 with hypoxic RF and bilat infiltrates.  ? DDD (degenerative disc disease)   ? spinal stenosis  ? Elevated liver enzymes   ? 2021 after starting methotrexate. 2022->rheum d/c'd her methotrexate   Her abd u/s 12/05/20 showed fatty liver, o/w normal.  ? Fatty liver   ? 11/2020 liver u/s  ? Gastritis   ? H pylori NEG on EGD 02/2021  ? GERD (gastroesophageal reflux disease)   ? History of adenomatous polyp  of colon 06/2021  ? 2022, recall 5 yrs  ? History of GI bleed   ? NSAIDS  ? Hyperlipidemia   ? mixed  ? Microscopic hematuria   ? full w/ u unrevealing X 2  ? Normal nuclear stress test 11/11 and 06/2014  ? negative, EF normal  ? Palpitations 2006  ? 48H holter neg  ? Positive occult stool blood test   ? iFOB positive 04/2021->colonoscopy showed multiple adenomatous polyps and nonbleeding int hem.  ? Prediabetes   ? Highest A1c 6.1% as of 03/2017  ? RBBB (right bundle branch block)   ? Recurrent UTI   ? Seronegative rheumatoid arthritis ([place]) 10/2019  ? multiple sites->pred started, methotrex started 10/2019; rheum to add humira as of 03/2020  ? Tobacco dependence in remission   ? Quit fall 2015.    ? ? ?Tobacco History: ?Social History  ? ?Tobacco Use  ?Smoking Status Former  ? Packs/day: 1.00  ? Years: 35.00  ? Pack years: 35.00  ? Types: Cigarettes  ? Quit date: 07/22/2014  ? Years since quitting: 7.6  ?Smokeless Tobacco Never  ?Tobacco Comments  ? down to ~2 cigarettes/daily (06/23/14) - Quit Smoking around 07/20/14!  ? ?Counseling given: Not Answered ?Tobacco comments: down to ~2 cigarettes/daily (06/23/14) - Quit Smoking around 07/20/14! ? ? ?Continue to not smoke ? ?Outpatient Encounter Medications as of 03/13/2022  ?Medication Sig  ? acetaminophen (TYLENOL) 500 MG tablet Take 500 mg by mouth every 6 (six) hours as needed for fever.  ? Adalimumab (HUMIRA) 40 MG/0.8ML PSKT Inject 40 mg into the skin every 14 (fourteen) days.  ? albuterol (VENTOLIN HFA) 108 (90 Base) MCG/ACT inhaler Inhale 2  puffs into the lungs every 4 (four) hours as needed for wheezing or shortness of breath (bronchitis). Reported on 03/12/2016  ? Calcium Carb-Cholecalciferol (CALCIUM/VITAMIN D PO) Take 1 tablet by mouth

## 2022-03-22 ENCOUNTER — Encounter: Payer: Self-pay | Admitting: Internal Medicine

## 2022-04-09 ENCOUNTER — Other Ambulatory Visit: Payer: Self-pay

## 2022-04-09 MED ORDER — OXYCODONE HCL 5 MG PO TABS: ORAL_TABLET | ORAL | 0 refills | Status: DC

## 2022-04-09 NOTE — Telephone Encounter (Signed)
Requesting: oxycodone Contract: 01/16/22 UDS: 01/17/22 Last Visit: 01/17/22 Next Visit: 04/19/22 Last Refill:03/07/22(180,0)  Please Advise. Med pending

## 2022-04-09 NOTE — Telephone Encounter (Signed)
Patient refill request.   [facility] [place]  oxyCODONE (OXY IR/ROXICODONE) 5 MG immediate release tablet [[id number]]

## 2022-04-10 NOTE — Telephone Encounter (Signed)
Pt advised refill sent. °

## 2022-04-19 ENCOUNTER — Encounter: Payer: Self-pay | Admitting: Family Medicine

## 2022-04-19 ENCOUNTER — Ambulatory Visit (INDEPENDENT_AMBULATORY_CARE_PROVIDER_SITE_OTHER): Payer: Medicare HMO | Admitting: Family Medicine

## 2022-04-19 VITALS — BP 109/70 | HR 87 | Temp 98.5°F | Ht 65.0 in | Wt 202.2 lb

## 2022-04-19 DIAGNOSIS — Z0000 Encounter for general adult medical examination without abnormal findings: Secondary | ICD-10-CM

## 2022-04-19 DIAGNOSIS — M170 Bilateral primary osteoarthritis of knee: Secondary | ICD-10-CM

## 2022-04-19 DIAGNOSIS — R7303 Prediabetes: Secondary | ICD-10-CM | POA: Diagnosis not present

## 2022-04-19 DIAGNOSIS — G894 Chronic pain syndrome: Secondary | ICD-10-CM | POA: Diagnosis not present

## 2022-04-19 DIAGNOSIS — I10 Essential (primary) hypertension: Secondary | ICD-10-CM | POA: Diagnosis not present

## 2022-04-19 DIAGNOSIS — Z79899 Other long term (current) drug therapy: Secondary | ICD-10-CM

## 2022-04-19 MED ORDER — PROMETHAZINE HCL 12.5 MG PO TABS: ORAL_TABLET | ORAL | 2 refills | Status: DC

## 2022-04-19 MED ORDER — LORAZEPAM 1 MG PO TABS: ORAL_TABLET | ORAL | 5 refills | Status: DC

## 2022-04-19 MED ORDER — PANTOPRAZOLE SODIUM 40 MG PO TBEC
DELAYED_RELEASE_TABLET | ORAL | 1 refills | Status: DC
Start: 2022-04-19 — End: 2022-10-18

## 2022-04-19 MED ORDER — LISINOPRIL 5 MG PO TABS: ORAL_TABLET | ORAL | 1 refills | Status: DC

## 2022-04-19 MED ORDER — CYCLOBENZAPRINE HCL 10 MG PO TABS: 10.0000 mg | ORAL_TABLET | Freq: Three times a day (TID) | ORAL | 1 refills | Status: DC | PRN

## 2022-04-19 NOTE — Patient Instructions (Signed)

## 2022-04-19 NOTE — Progress Notes (Signed)
Office Note 04/19/2022  CC:  Chief Complaint  Patient presents with   Annual Exam    Pt is fasting    HPI:  Patient is a 66 y.o. female who is here for annual health maintenance exam and 36-monthfollow-up chronic pain syndrome. A/P as of last visit: "#1 COPD, intermittent hypoxia since January this year when she had hypoxic respiratory failure due to COVID.  No change in baseline COPD symptoms. Refer to pulmonology at this time. O2 sat today is 95% on room air in the office.   #2 chronic pain syndrome. Stable. Urine drug screen today. Oxycodone 5 mg, 1-2 3 times daily as needed, #180--prescription sent to have on hold at pharmacy.   #3 NASH. LFTs stable earlier this month with rheumatology.   #4 hyperlipidemia, mixed. Nonobstructive coronary artery disease--- would like LDL less than 70. She is on maximum dose of rosuvastatin and is also on fenofibrate 160 mg a day. Fasting lipid panel today.   #5 prediabetes. Fasting sugar and hemoglobin A1c today."  INTERIM HX: She is doing okay just feels tired and her usual chronic pain.   Fortunately, her rheumatoid arthritis is responding well to Humira. Her pain is still pretty constant and of moderate severity, especially the knees.  Chronic pain syndrome: Indication for chronic opioid: chronic bilat LBP w/out sciatica, and chronic pain from bilat osteoarthritis knees.  Also LBP and bilat shoulder pain.  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  She remains on Humira and is doing well on this.  She still takes 1-2 oxycodone 3 times a day and this sufficiently helps control her pain to allow for maximum functioning and quality of life. Medication and dose: oxycodone '5mg'$ , 1-2 tid prn. # pills per month: #180. PMP AWARE reviewed today: most recent rx for oxycodone was filled 04/10/2022, #180, rx by me. Most recent lorazepam prescription filled 03/08/2022, #60, prescription by me.  No red flags.  Says breathing  feels good lately. Sleep study to be done in sleep lab soon, 05/04/22--Dr. Hunsucker.  Past Medical History:  Diagnosis Date   Aortic valve stenosis    "very mild"   Atypical chest pain 2007; 2015   cardiolyte neg, echo nl, cath showed mild/nonobstructive LAD disease   Bilateral primary osteoarthritis of knee 06/30/2012   Right >L.  Right unicompartmental knee arthroplasty 10/25/18   CAD (coronary artery disease)    Chronic LBP    Multiple surgeries   [place]D [place]    COVID-19 virus infection 06/02/2019   [facility].  Admitted 06/12/19 with hypoxic RF and bilat infiltrates.   DDD (degenerative disc disease)    spinal stenosis   Elevated liver enzymes    2021 after starting methotrexate. 2022->rheum d/c'd her methotrexate   Her abd u/s 12/05/20 showed fatty liver, o/w normal.   Fatty liver    11/2020 liver u/s   Gastritis    H pylori NEG on EGD 02/2021   GERD (gastroesophageal reflux disease)    History of adenomatous polyp of colon 06/2021   2022, recall 5 yrs   History of GI bleed    NSAIDS   Hyperlipidemia    mixed   Microscopic hematuria    full w/ u unrevealing X 2   Normal nuclear stress test 11/11 and 06/2014   negative, EF normal   Palpitations 2006   48H holter neg   Positive occult stool blood test    iFOB positive 04/2021->colonoscopy showed  multiple adenomatous polyps and nonbleeding int hem.   Prediabetes    Highest A1c 6.1% as of 03/2017   RBBB (right bundle branch block)    Recurrent UTI    Seronegative rheumatoid arthritis ([place]) 10/2019   multiple sites->pred started, methotrex started 10/2019; rheum to add humira as of 03/2020   Tobacco dependence in remission    Quit fall 2015.      Past Surgical History:  Procedure Laterality Date   ABDOMINAL HYSTERECTOMY  1997   DUB   APPENDECTOMY  1984   BIOPSY  02/14/2021   Procedure: BIOPSY;  Surgeon: Eloise Harman, DO;  Location: [facility];  Service:  Endoscopy;;   CARDIAC CATHETERIZATION  10/09/2005   no CAD, mildly elevated LVEDP, normal LV function (Dr. Gerrie Nordmann)   CARDIAC CATHETERIZATION N/A 05/16/2015   Mild non-obstructive CAD--med mgmt.  Procedure: Left Heart Cath and Coronary Angiography;  Surgeon: Pixie Casino, MD;  Location: [place] [facility];  Service: Cardiovascular;  Laterality: N/A;   CARDIOVASCULAR STRESS TEST  06/2014   normal lexiscan NST   CARDIOVASCULAR STRESS TEST  2006   persantine - no ischemia, low risk    COLONOSCOPY W/ POLYPECTOMY  06/12/2021   multiple adenomas--recall 3-5 yrs   COLONOSCOPY WITH PROPOFOL N/A 06/12/2021   Procedure: COLONOSCOPY WITH PROPOFOL;  Surgeon: Eloise Harman, DO;  Location: [facility];  Service: Endoscopy;  Laterality: N/A;  8:30am   ESOPHAGOGASTRODUODENOSCOPY  02/14/2021   Gastritis ( H pylori NEG) w/out bleeding   ESOPHAGOGASTRODUODENOSCOPY (EGD) WITH PROPOFOL N/A 02/14/2021   Procedure: ESOPHAGOGASTRODUODENOSCOPY (EGD) WITH PROPOFOL;  Surgeon: Eloise Harman, DO;  Location: [facility];  Service: Endoscopy;  Laterality: N/A;  AM   KNEE ARTHROSCOPY Bilateral    left wrist ganglion cyst excision  2010   [place]   right iliac crest bone graft+metal instrumentation; 2005 metal removal and decompression, 2011 lumbar decompression 4-11, then stabilization/ instrumentation done 09-19-10: L2,L3, L5 left and L2 , L3, L4 right pedical remnant L4 left embedded. Left iliac crest bone graft-- Dr Zollie Pee SPINE SURGERY  02/10/2016   Dr. Velna Ochs: lumbar decompression, instrumentation removal, and fusion exploration--HELPED her a lot, esp her radicular leg pains.   OOPHORECTOMY Right    cyst   PARTIAL KNEE ARTHROPLASTY Right 11/04/2018   Procedure: UNICOMPARTMENTAL KNEE;  Surgeon: Renette Butters, MD;  Location: [facility];  Service: Orthopedics;  Laterality: Right;  Adductor Block   POLYPECTOMY  06/12/2021   Procedure: POLYPECTOMY;  Surgeon: Eloise Harman,  DO;  Location: [facility];  Service: Endoscopy;;   TONSILLECTOMY  66 yrs old   TRANSTHORACIC ECHOCARDIOGRAM  2006   EF=>55%, trace MR, mild TR, trace AV regurg, trace pulm valve regurg     Family History  Problem Relation Age of Onset   Heart Problems Mother        and thyroid problems   Hyperlipidemia Mother    Diabetes Mother    Breast cancer Mother    Heart failure Father        CHF, heart attack, diabetes, hyperlipidemia   Heart disease Brother        back problems   Hypertension Brother    Kidney failure Maternal Grandmother    Stroke Maternal Grandfather    Cancer Paternal Grandmother        mouth - snuff   Heart attack Paternal Grandfather        stroke, HTN   Hyperlipidemia  Brother    Heart attack Brother    Cancer Brother    Coronary artery disease Neg Hx     Social History   Socioeconomic History   Marital status: Married    Spouse name: Not on file   Number of children: Not on file   Years of education: Not on file   Highest education level: GED or equivalent  Occupational History   Not on file  Tobacco Use   Smoking status: Former    Packs/day: 1.00    Years: 35.00    Total pack years: 35.00    Types: Cigarettes    Quit date: 07/22/2014    Years since quitting: 7.7   Smokeless tobacco: Never   Tobacco comments:    down to ~2 cigarettes/daily (06/23/14) - Quit Smoking around 07/20/14!  Vaping Use   Vaping Use: Never used  Substance and Sexual Activity   Alcohol use: No    Alcohol/week: 0.0 standard drinks of alcohol   Drug use: No   Sexual activity: Yes    Partners: Male    Birth control/protection: Surgical  Other Topics Concern   Not on file  Social History Narrative   Not on file   Social Determinants of Health   Financial Resource Strain: Low Risk  (01/16/2022)   Overall Financial Resource Strain (CARDIA)    Difficulty of Paying Living Expenses: Not very hard  Food Insecurity: No Food Insecurity (01/16/2022)   Hunger Vital Sign     Worried About Running Out of Food in the Last Year: Never true    Ran Out of Food in the Last Year: Never true  Transportation Needs: No Transportation Needs (01/16/2022)   PRAPARE - Hydrologist (Medical): No    Lack of Transportation (Non-Medical): No  Physical Activity: Insufficiently Active (01/16/2022)   Exercise Vital Sign    Days of Exercise per Week: 2 days    Minutes of Exercise per Session: 20 min  Stress: Stress Concern Present (01/16/2022)   [place]    Feeling of Stress : To some extent  Social Connections: Socially Integrated (01/16/2022)   Social Connection and Isolation Panel [NHANES]    Frequency of Communication with Friends and Family: More than three times a week    Frequency of Social Gatherings with Friends and Family: Twice a week    Attends Religious Services: More than 4 times per year    Active Member of [employer] or Organizations: Yes    Attends Music therapist: More than 4 times per year    Marital Status: Married  Human resources officer Violence: Not At Risk (08/10/2020)   Humiliation, Afraid, Rape, and Kick questionnaire    Fear of Current or Ex-Partner: No    Emotionally Abused: No    Physically Abused: No    Sexually Abused: No    Outpatient Medications Prior to Visit  Medication Sig Dispense Refill   acetaminophen (TYLENOL) 500 MG tablet Take 500 mg by mouth every 6 (six) hours as needed for fever.     Adalimumab (HUMIRA) 40 MG/0.8ML PSKT Inject 40 mg into the skin every 14 (fourteen) days.     Calcium Carb-Cholecalciferol (CALCIUM/VITAMIN D PO) Take 1 tablet by mouth daily.     clopidogrel (PLAVIX) 75 MG tablet TAKE 1 TABLET(75 MG) BY MOUTH DAILY 90 tablet 1   fenofibrate 160 MG tablet TAKE 1 TABLET BY MOUTH EVERY DAY WITH FOOD 90 tablet 1  folic acid (FOLVITE) 1 MG tablet Take 1 mg by mouth daily.      oxyCODONE (OXY IR/ROXICODONE) 5 MG immediate  release tablet 1-2 po tid pain 180 tablet 0   rosuvastatin (CRESTOR) 40 MG tablet TAKE 1 TABLET(40 MG) BY MOUTH DAILY 90 tablet 1   Tiotropium Bromide-Olodaterol (STIOLTO RESPIMAT) 2.5-2.5 MCG/ACT AERS Inhale 2 puffs into the lungs daily. 1 each 11   cyclobenzaprine (FLEXERIL) 10 MG tablet TAKE 1 TABLET BY MOUTH EVERY 8 HOURS AS NEEDED 90 tablet 1   albuterol (VENTOLIN HFA) 108 (90 Base) MCG/ACT inhaler Inhale 2 puffs into the lungs every 4 (four) hours as needed for wheezing or shortness of breath (bronchitis). Reported on 03/12/2016 (Patient not taking: Reported on 04/19/2022) 18 g 1   nitroGLYCERIN (NITROSTAT) 0.4 MG SL tablet Place 1 tablet (0.4 mg total) under the tongue every 5 (five) minutes as needed for chest pain (MAX 3 doses.). (Patient not taking: Reported on 04/19/2022) 10 tablet 1   lisinopril (ZESTRIL) 5 MG tablet TAKE 1 TABLET(5 MG) BY MOUTH DAILY 90 tablet 0   LORazepam (ATIVAN) 1 MG tablet TAKE 1 TABLET(1 MG) BY MOUTH TWICE DAILY (Patient taking differently: Take 1 mg by mouth 2 (two) times daily.) 60 tablet 5   pantoprazole (PROTONIX) 40 MG tablet TAKE 1 TABLET(40 MG) BY MOUTH DAILY 90 tablet 1   promethazine (PHENERGAN) 12.5 MG tablet 1-2 tabs po q6h prn nausea (Patient not taking: Reported on 04/19/2022) 20 tablet 2   No facility-administered medications prior to visit.    Allergies  Allergen Reactions   Penicillins Anaphylaxis and Swelling    DID THE REACTION INVOLVE: Swelling of the face/tongue/throat, SOB, or low BP? Yes Sudden or severe rash/hives, skin peeling, or the inside of the mouth or nose? Yes Did it require medical treatment? Yes When did it last happen? 66 years old If all above answers are "NO", may proceed with cephalosporin use.    Aspirin Other (See Comments)     GI Bleed   Beta Adrenergic Blockers Other (See Comments)    REACTION: decreased libido   Citalopram Nausea And Vomiting   Nsaids Other (See Comments)     GI Upset   Other Hives and Itching     Walnuts    Plaquenil [Hydroxychloroquine Sulfate] Other (See Comments)    Tachycardia   Prochlorperazine Edisylate Other (See Comments)     Stroke like symptoms   Sulfonamide Derivatives Other (See Comments)    Unknown allergic reaction   Amitriptyline Hcl Palpitations and Other (See Comments)     increased heart rate   ROS Review of Systems  Constitutional:  Negative for appetite change, chills, fatigue and fever.  HENT:  Negative for congestion, dental problem, ear pain and sore throat.   Eyes:  Negative for discharge, redness and visual disturbance.  Respiratory:  Negative for cough, chest tightness, shortness of breath and wheezing.   Cardiovascular:  Negative for chest pain, palpitations and leg swelling.  Gastrointestinal:  Negative for abdominal pain, blood in stool, diarrhea, nausea and vomiting.  Genitourinary:  Negative for difficulty urinating, dysuria, flank pain, frequency, hematuria and urgency.  Musculoskeletal:  Negative for arthralgias, back pain, joint swelling, myalgias and neck stiffness.  Skin:  Negative for pallor and rash.  Neurological:  Negative for dizziness, speech difficulty, weakness and headaches.  Hematological:  Negative for adenopathy. Does not bruise/bleed easily.  Psychiatric/Behavioral:  Negative for confusion and sleep disturbance. The patient is not nervous/anxious.     PE;  04/19/2022    1:58 PM 03/13/2022    2:22 PM 01/17/2022    2:01 PM  Vitals with BMI  Height '5\' 5"'$  '5\' 5"'$  '5\' 3"'$   Weight 202 lbs 3 oz 202 lbs 205 lbs 6 oz  BMI 33.65 55.73 22.02  Systolic 542  706  Diastolic 70  73  Pulse 87 96 90  02 sat 95% RA today  Exam chaperoned by Kavin Leech, CMA. Gen: Alert, well appearing.  Patient is oriented to person, place, time, and situation. AFFECT: pleasant, lucid thought and speech. ENT: Ears: EACs clear, normal epithelium.  TMs with good light reflex and landmarks bilaterally.  Eyes: no injection, icteris, swelling, or exudate.   EOMI, PERRLA. Nose: no drainage or turbinate edema/swelling.  No injection or focal lesion.  Mouth: lips without lesion/swelling.  Oral mucosa pink and moist.  Dentition intact and without obvious caries or gingival swelling.  Oropharynx without erythema, exudate, or swelling.  Neck: supple/nontender.  No LAD, mass, or TM.  Carotid pulses 2+ bilaterally, without bruits. CV: RRR, no m/r/g.   LUNGS: CTA bilat, nonlabored resps, good aeration in all lung fields. ABD: soft, NT, ND, BS normal.  No hepatospenomegaly or mass.  No bruits. EXT: no clubbing, cyanosis, or edema.  Musculoskeletal: no joint swelling, erythema, warmth, or tenderness.  ROM of all joints intact. Skin - no sores or suspicious lesions or rashes or color changes  Pertinent labs:  Lab Results  Component Value Date   TSH 0.42 09/24/2019   Lab Results  Component Value Date   WBC 9.2 01/12/2022   HGB 13.9 01/12/2022   HCT 41 01/12/2022   MCV 98.0 11/10/2021   PLT 147 (A) 01/12/2022   Lab Results  Component Value Date   CREATININE 0.8 01/12/2022   BUN 10 01/12/2022   NA 139 01/12/2022   K 3.7 01/12/2022   CL 103 01/12/2022   CO2 33 (A) 01/12/2022   Lab Results  Component Value Date   ALT 73 (A) 01/12/2022   AST 94 (A) 01/12/2022   ALKPHOS 69 01/12/2022   BILITOT 0.9 11/09/2021   Lab Results  Component Value Date   CHOL 110 01/18/2022   Lab Results  Component Value Date   HDL 39 (L) 01/18/2022   Lab Results  Component Value Date   LDLCALC 48 01/18/2022   Lab Results  Component Value Date   TRIG 148 01/18/2022   Lab Results  Component Value Date   CHOLHDL 2.8 01/18/2022   Lab Results  Component Value Date   HGBA1C 6.2 (H) 01/18/2022   ASSESSMENT AND PLAN:   1 chronic pain syndrome. Stable. UDS UTD. Oxycodone 5 mg, 1-2 3 times daily as needed, #180 per mo-->new rx NOT needed today   #3 NASH. LFTs today.   #4 hyperlipidemia, mixed. Nonobstructive coronary artery disease--- would like LDL  less than 70. LDL was 48 about 3 months ago .  #5 prediabetes.  Most recent A1c about 3 months ago was up to 0.2% Hemoglobin A1c today.  #6 Health maintenance exam: Reviewed age and gender appropriate health maintenance issues (prudent diet, regular exercise, health risks of tobacco and excessive alcohol, use of seatbelts, fire alarms in home, use of sunscreen).  Also reviewed age and gender appropriate health screening as well as vaccine recommendations. Vaccines: Shingrix-->she declines.  O/w UTD. Labs: cbc,cmet, hba1c Cervical ca screening:  no longer needed (hx of hysterectomy for benign dx) Breast ca screening: due for mammogram but she declines. Colon ca screening:  recall 2025-2027 timeframe. Osteoporosis: DEXA 03/2021 via Rheum MD-->osteopenia.  Continue calcium and vitamin D.  An After Visit Summary was printed and given to the patient.  FOLLOW UP:  Return in about 3 months (around 07/20/2022) for routine chronic illness f/u.  Signed:  Crissie Sickles, MD           04/19/2022

## 2022-04-20 LAB — HEMOGLOBIN A1C: Hgb A1c MFr Bld: 6.3 % (ref 4.6–6.5)

## 2022-04-20 LAB — CBC WITH DIFFERENTIAL/PLATELET
Basophils Absolute: 0.1 10*3/uL (ref 0.0–0.1)
Basophils Relative: 0.9 % (ref 0.0–3.0)
Eosinophils Absolute: 0.2 10*3/uL (ref 0.0–0.7)
Eosinophils Relative: 2.1 % (ref 0.0–5.0)
HCT: 41.5 % (ref 36.0–46.0)
Hemoglobin: 13.9 g/dL (ref 12.0–15.0)
Lymphocytes Relative: 40.3 % (ref 12.0–46.0)
Lymphs Abs: 4.5 10*3/uL — ABNORMAL HIGH (ref 0.7–4.0)
MCHC: 33.6 g/dL (ref 30.0–36.0)
MCV: 94.5 fl (ref 78.0–100.0)
Monocytes Absolute: 0.7 10*3/uL (ref 0.1–1.0)
Monocytes Relative: 6.7 % (ref 3.0–12.0)
Neutro Abs: 5.5 10*3/uL (ref 1.4–7.7)
Neutrophils Relative %: 50 % (ref 43.0–77.0)
Platelets: 148 10*3/uL — ABNORMAL LOW (ref 150.0–400.0)
RBC: 4.39 Mil/uL (ref 3.87–5.11)
RDW: 13.6 % (ref 11.5–15.5)
WBC: 11 10*3/uL — ABNORMAL HIGH (ref 4.0–10.5)

## 2022-04-20 LAB — COMPREHENSIVE METABOLIC PANEL
ALT: 49 U/L — ABNORMAL HIGH (ref 0–35)
AST: 61 U/L — ABNORMAL HIGH (ref 0–37)
Albumin: 4.4 g/dL (ref 3.5–5.2)
Alkaline Phosphatase: 72 U/L (ref 39–117)
BUN: 14 mg/dL (ref 6–23)
CO2: 32 mEq/L (ref 19–32)
Calcium: 11.1 mg/dL — ABNORMAL HIGH (ref 8.4–10.5)
Chloride: 104 mEq/L (ref 96–112)
Creatinine, Ser: 0.8 mg/dL (ref 0.40–1.20)
GFR: 77.08 mL/min (ref >60.00)
Glucose, Bld: 97 mg/dL (ref 70–99)
Potassium: 4.6 mEq/L (ref 3.5–5.1)
Sodium: 145 mEq/L (ref 135–145)
Total Bilirubin: 0.8 mg/dL (ref 0.2–1.2)
Total Protein: 7.1 g/dL (ref 6.0–8.3)

## 2022-04-21 ENCOUNTER — Ambulatory Visit (INDEPENDENT_AMBULATORY_CARE_PROVIDER_SITE_OTHER): Payer: Medicare HMO

## 2022-04-21 DIAGNOSIS — Z0000 Encounter for general adult medical examination without abnormal findings: Secondary | ICD-10-CM | POA: Diagnosis not present

## 2022-04-21 MED ORDER — TETANUS-DIPHTH-ACELL PERTUSSIS 5-2.5-18.5 LF-MCG/0.5 IM SUSP: 0.5000 mL | Freq: Once | INTRAMUSCULAR | 0 refills | Status: AC

## 2022-04-21 MED ORDER — SHINGRIX 50 MCG/0.5ML IM SUSR: 0.5000 mL | Freq: Once | INTRAMUSCULAR | 1 refills | Status: AC

## 2022-04-21 NOTE — Progress Notes (Signed)
Subjective:   Brenda [name] is a 66 y.o. female who presents for an Initial Medicare Annual Wellness Visit.  I connected with  AIRIKA ALKHATIB on 04/21/22 by a audio enabled telemedicine application and verified that I am speaking with the correct person using two identifiers.  Patient Location: Home  Provider Location: Home Office  I discussed the limitations of evaluation and management by telemedicine. The patient expressed understanding and agreed to proceed.   Review of Systems    Defer to PCP Cardiac Risk Factors include: advanced age (>58mn, >>46women);obesity (BMI >30kg/m2);sedentary lifestyle;hypertension;dyslipidemia     Objective:    Today's Vitals   04/21/22 0902  PainSc: 7    There is no height or weight on file to calculate BMI.     04/21/2022    9:14 AM 11/09/2021    2:32 PM 06/12/2021    6:55 AM 06/08/2021   11:12 AM 02/14/2021    7:02 AM 02/10/2021    9:16 AM 08/10/2020    1:35 PM  Advanced Directives  Does Patient Have a Medical Advance Directive? No No No No No No Yes  Does patient want to make changes to medical advance directive?       Yes (MAU/Ambulatory/Procedural Areas - Information given)  Would patient like information on creating a medical advance directive? Yes (MAU/Ambulatory/Procedural Areas - Information given) No - Patient declined No - Patient declined No - Patient declined No - Patient declined Yes (MAU/Ambulatory/Procedural Areas - Information given)     Current Medications (verified) Outpatient Encounter Medications as of 04/21/2022  Medication Sig   Tdap (BOOSTRIX) 5-2.5-18.5 LF-MCG/0.5 injection Inject 0.5 mLs into the muscle once for 1 dose.   Zoster Vaccine Adjuvanted ([facility] injection Inject 0.5 mLs into the muscle once for 1 dose.   acetaminophen (TYLENOL) 500 MG tablet Take 500 mg by mouth every 6 (six) hours as needed for fever.   Adalimumab (HUMIRA) 40 MG/0.8ML PSKT Inject 40 mg into the skin every 14 (fourteen) days.    albuterol (VENTOLIN HFA) 108 (90 Base) MCG/ACT inhaler Inhale 2 puffs into the lungs every 4 (four) hours as needed for wheezing or shortness of breath (bronchitis). Reported on 03/12/2016 (Patient not taking: Reported on 04/19/2022)   Calcium Carb-Cholecalciferol (CALCIUM/VITAMIN D PO) Take 1 tablet by mouth daily.   clopidogrel (PLAVIX) 75 MG tablet TAKE 1 TABLET(75 MG) BY MOUTH DAILY   cyclobenzaprine (FLEXERIL) 10 MG tablet Take 1 tablet (10 mg total) by mouth every 8 (eight) hours as needed.   fenofibrate 160 MG tablet TAKE 1 TABLET BY MOUTH EVERY DAY WITH FOOD   folic acid (FOLVITE) 1 MG tablet Take 1 mg by mouth daily.    lisinopril (ZESTRIL) 5 MG tablet TAKE 1 TABLET(5 MG) BY MOUTH DAILY   LORazepam (ATIVAN) 1 MG tablet TAKE 1 TABLET(1 MG) BY MOUTH TWICE DAILY   nitroGLYCERIN (NITROSTAT) 0.4 MG SL tablet Place 1 tablet (0.4 mg total) under the tongue every 5 (five) minutes as needed for chest pain (MAX 3 doses.). (Patient not taking: Reported on 04/19/2022)   oxyCODONE (OXY IR/ROXICODONE) 5 MG immediate release tablet 1-2 po tid pain   pantoprazole (PROTONIX) 40 MG tablet TAKE 1 TABLET(40 MG) BY MOUTH DAILY   promethazine (PHENERGAN) 12.5 MG tablet 1-2 tabs po q6h prn nausea   rosuvastatin (CRESTOR) 40 MG tablet TAKE 1 TABLET(40 MG) BY MOUTH DAILY   Tiotropium Bromide-Olodaterol (STIOLTO RESPIMAT) 2.5-2.5 MCG/ACT AERS Inhale 2 puffs into the lungs daily.   No facility-administered  encounter medications on file as of 04/21/2022.    Allergies (verified) Penicillins, Aspirin, Beta adrenergic blockers, Citalopram, Nsaids, Other, Plaquenil [hydroxychloroquine sulfate], Prochlorperazine edisylate, Sulfonamide derivatives, and Amitriptyline hcl   History: Past Medical History:  Diagnosis Date   Allergy    Anxiety    Aortic valve stenosis    "very mild"   Atypical chest pain 2007; 2015   cardiolyte neg, echo nl, cath showed mild/nonobstructive LAD disease   Bilateral primary osteoarthritis  of knee 06/30/2012   Right >L.  Right unicompartmental knee arthroplasty 10/25/18   CAD (coronary artery disease)    Chronic LBP    Multiple surgeries   COPD (chronic obstructive pulmonary disease) ([place])    COVID-19 virus infection 06/02/2019   Eval at [facility] ED and d/c'd home.  Admitted 06/12/19 with hypoxic RF and bilat infiltrates.   DDD (degenerative disc disease)    spinal stenosis   Elevated liver enzymes    2021 after starting methotrexate. 2022->rheum d/c'd her methotrexate   Her abd u/s 12/05/20 showed fatty liver, o/w normal.   Fatty liver    11/2020 liver u/s   Gastritis    H pylori NEG on EGD 02/2021   GERD (gastroesophageal reflux disease)    History of adenomatous polyp of colon 06/2021   2022, recall 5 yrs   History of GI bleed    NSAIDS   Hyperlipidemia    mixed   Microscopic hematuria    full w/ u unrevealing X 2   Normal nuclear stress test 11/11 and 06/2014   negative, EF normal   Oxygen deficiency    Just At night   Palpitations 2006   48H holter neg   Positive occult stool blood test    iFOB positive 04/2021->colonoscopy showed multiple adenomatous polyps and nonbleeding int hem.   Prediabetes    Highest A1c 6.1% as of 03/2017   RBBB (right bundle branch block)    Recurrent UTI    Seronegative rheumatoid arthritis ([place]) 10/2019   multiple sites->pred started, methotrex started 10/2019; rheum to add humira as of 03/2020   Tobacco dependence in remission    Quit fall 2015.     Past Surgical History:  Procedure Laterality Date   ABDOMINAL HYSTERECTOMY  1997   DUB   APPENDECTOMY  1984   BIOPSY  02/14/2021   Procedure: BIOPSY;  Surgeon: Eloise Harman, DO;  [facility];  Service: Endoscopy;;   CARDIAC CATHETERIZATION  10/09/2005   no CAD, mildly elevated LVEDP, normal LV function (Dr. Gerrie Nordmann)   CARDIAC CATHETERIZATION N/A 05/16/2015   Mild non-obstructive CAD--med mgmt.  Procedure: Left Heart Cath and Coronary Angiography;  Surgeon: Pixie Casino, MD;  Location: [facility];  Service: Cardiovascular;  Laterality: N/A;   CARDIOVASCULAR STRESS TEST  06/2014   normal lexiscan NST   CARDIOVASCULAR STRESS TEST  2006   persantine - no ischemia, low risk    COLONOSCOPY W/ POLYPECTOMY  06/12/2021   multiple adenomas--recall 3-5 yrs   COLONOSCOPY WITH PROPOFOL N/A 06/12/2021   Procedure: COLONOSCOPY WITH PROPOFOL;  Surgeon: Eloise Harman, DO;  [facility];  Service: Endoscopy;  Laterality: N/A;  8:30am   ESOPHAGOGASTRODUODENOSCOPY  02/14/2021   Gastritis ( H pylori NEG) w/out bleeding   ESOPHAGOGASTRODUODENOSCOPY (EGD) WITH PROPOFOL N/A 02/14/2021   Procedure: ESOPHAGOGASTRODUODENOSCOPY (EGD) WITH PROPOFOL;  Surgeon: Eloise Harman, DO;  [facility];  Service: Endoscopy;  Laterality: N/A;  AM   JOINT REPLACEMENT  11-05-2017   Partial right knee replacement   KNEE ARTHROSCOPY Bilateral    left wrist ganglion cyst excision  2010   [place]   right iliac crest bone graft+metal instrumentation; 2005 metal removal and decompression, 2011 lumbar decompression 4-11, then stabilization/ instrumentation done 09-19-10: L2,L3, L5 left and L2 , L3, L4 right pedical remnant L4 left embedded. Left iliac crest bone graft-- Dr Zollie Pee SPINE SURGERY  02/10/2016   Dr. Velna Ochs: lumbar decompression, instrumentation removal, and fusion exploration--HELPED her a lot, esp her radicular leg pains.   OOPHORECTOMY Right    cyst   PARTIAL KNEE ARTHROPLASTY Right 11/04/2018   Procedure: UNICOMPARTMENTAL KNEE;  Surgeon: Renette Butters, MD;  Location: [facility];  Service: Orthopedics;  Laterality: Right;  Adductor Block   POLYPECTOMY  06/12/2021   Procedure: POLYPECTOMY;  Surgeon: Eloise Harman, DO;  [facility];  Service: Endoscopy;;   SPINE SURGERY  05-24-1991   Dr Velna Ochs surgeon [facility] [place]. 2 level spinal fusion. 09-22-1998, 06-21-2004 took plates and screws out also got bone off of  nerves. 2012 took out plates and screws and got bones off of nerves. 2017 2 level spinal fusion and scraped bones away from nerves.   TONSILLECTOMY  66 yrs old   TRANSTHORACIC ECHOCARDIOGRAM  2006   EF=>55%, trace MR, mild TR, trace AV regurg, trace pulm valve regurg    TUBAL LIGATION     Family History  Problem Relation Age of Onset   Heart Problems Mother        and thyroid problems   Hyperlipidemia Mother    Diabetes Mother    Breast cancer Mother    Arthritis Mother    Heart failure Father        CHF, heart attack, diabetes, hyperlipidemia   Alcohol abuse Father    Heart disease Father    Heart disease Brother        back problems   Hypertension Brother    Alcohol abuse Brother    Varicose Veins Daughter    Kidney failure Maternal Grandmother    Stroke Maternal Grandfather    Cancer Paternal Grandmother        mouth - snuff   Heart attack Paternal Grandfather        stroke, HTN   Cancer Brother    Hyperlipidemia Brother    Heart attack Brother    Cancer Brother    Miscarriages / Stillbirths Sister    Coronary artery disease Neg Hx    Social History   Socioeconomic History   Marital status: Married    Spouse name: Not on file   Number of children: Not on file   Years of education: Not on file   Highest education level: GED or equivalent  Occupational History   Not on file  Tobacco Use   Smoking status: Former    Packs/day: 1.00    Years: 35.00    Total pack years: 35.00    Types: Cigarettes    Quit date: 07/22/2014    Years since quitting: 7.7   Smokeless tobacco: Never   Tobacco comments:    down to ~2 cigarettes/daily (06/23/14) - Quit Smoking around 07/20/14!  Vaping Use   Vaping Use: Never used  Substance and Sexual Activity   Alcohol use: No   Drug use: No   Sexual activity: Yes    Partners: Male    Birth control/protection: Surgical, Other-see comments    Comment: Hysterectomy  Other  Topics Concern   Not on file  Social History Narrative   Not  on file   Social Determinants of Health   Financial Resource Strain: Low Risk  (04/21/2022)   Overall Financial Resource Strain (CARDIA)    Difficulty of Paying Living Expenses: Not very hard  Food Insecurity: No Food Insecurity (04/21/2022)   Hunger Vital Sign    Worried About Running Out of Food in the Last Year: Never true    Ran Out of Food in the Last Year: Never true  Transportation Needs: No Transportation Needs (04/21/2022)   PRAPARE - Hydrologist (Medical): No    Lack of Transportation (Non-Medical): No  Physical Activity: Insufficiently Active (04/21/2022)   Exercise Vital Sign    Days of Exercise per Week: 2 days    Minutes of Exercise per Session: 20 min  Stress: Stress Concern Present (04/21/2022)   [place]    Feeling of Stress : To some extent  Social Connections: Socially Integrated (04/21/2022)   Social Connection and Isolation Panel [NHANES]    Frequency of Communication with Friends and Family: More than three times a week    Frequency of Social Gatherings with Friends and Family: Twice a week    Attends Religious Services: More than 4 times per year    Active Member of [employer] or Organizations: Yes    Attends Archivist Meetings: More than 4 times per year    Marital Status: Married    Tobacco Counseling Counseling given: Not Answered Tobacco comments: down to ~2 cigarettes/daily (06/23/14) - Quit Smoking around 07/20/14!   Clinical Intake:  Pre-visit preparation completed: Yes  Pain : 0-10 Pain Score: 7  Pain Type: Chronic pain Pain Location: Back Pain Orientation: Other (Comment) Pain Descriptors / Indicators: Throbbing Pain Onset: More than a month ago Pain Frequency: Intermittent     BMI - recorded: 33 Nutritional Status: BMI > 30  Obese Diabetes: No  How often do you need to have someone help you when you read instructions, pamphlets, or  other written materials from your doctor or pharmacy?: 1 - Never What is the last grade level you completed in school?: 10th  Diabetic?No  Interpreter Needed?: No  Information entered by :: Toria C., RMA   Activities of Daily Living    04/21/2022    9:01 AM 11/09/2021   10:41 PM  In your present state of health, do you have any difficulty performing the following activities:  Hearing? 1 0  Vision? 0 0  Difficulty concentrating or making decisions? 0 0  Walking or climbing stairs? 0 1  Dressing or bathing? 0 0  Doing errands, shopping? 0 0  Preparing Food and eating ? N   Using the Toilet? N   In the past six months, have you accidently leaked urine? N   Do you have problems with loss of bowel control? N   Managing your Medications? N   Managing your Finances? N   Housekeeping or managing your Housekeeping? Y   Comment Husband helps     Patient Care Team: Tammi Sou, MD as PCP - General Hilty, Nadean Corwin, MD as PCP - Cardiology (Cardiology) Debara Pickett Nadean Corwin, MD as Consulting Physician (Cardiology) Derian, Nicholaus Corolla, MD (Orthopedic Surgery) Renette Butters, MD as Consulting Physician (Orthopedic Surgery) Lahoma Rocker, MD as Consulting Physician (Rheumatology) Eloise Harman, DO as Consulting Physician (Internal Medicine)  Indicate any recent Medical Services  you may have received from other than [facility] providers in the past year (date may be approximate).     Assessment:   This is a routine wellness examination for [place].  Hearing/Vision screen No results found.  Dietary issues and exercise activities discussed: Current Exercise Habits: Home exercise routine, Type of exercise: stretching, Time (Minutes): 20, Frequency (Times/Week): 7, Weekly Exercise (Minutes/Week): 140, Intensity: Mild, Exercise limited by: orthopedic condition(s)   Goals Addressed   None   Depression Screen    04/21/2022    9:12 AM 01/17/2022    2:17 PM 10/19/2021    2:06 PM  08/10/2020    1:36 PM 12/28/2019    1:57 PM 12/17/2018   10:56 AM 03/06/2018    2:18 PM  PHQ 2/9 Scores  PHQ - 2 Score 0 2 0 0 0 0 0  PHQ- 9 Score       0    Fall Risk    04/21/2022    9:15 AM 01/17/2022    2:17 PM 01/16/2022    7:09 PM 10/19/2021    2:05 PM 08/10/2020    1:36 PM  [place] in the past year? 0 1 0 0 1  Number falls in past yr: 0 0  0 0  Injury with Fall? 0 0  0 0  Risk for fall due to :     History of fall(s)  Follow up Falls evaluation completed Falls evaluation completed  Falls evaluation completed Falls prevention discussed    FALL RISK PREVENTION PERTAINING TO THE HOME:  Any stairs in or around the home? Yes  If so, are there any without handrails? No  Home free of loose throw rugs in walkways, pet beds, electrical cords, etc? Yes  Adequate lighting in your home to reduce risk of falls? Yes   ASSISTIVE DEVICES UTILIZED TO PREVENT FALLS:  Life alert? No  Use of a cane, walker or w/c? Yes , walker Grab bars in the bathroom? Yes Shower chair or bench in shower? No  Elevated toilet seat or a handicapped toilet? No   TIMED UP AND GO:  Was the test performed? No .   Cognitive Function:        04/21/2022    9:24 AM  6CIT Screen  What Year? 0 points  What month? 0 points  What time? 0 points  Count back from 20 0 points  Months in reverse 0 points  Repeat phrase 0 points  Total Score 0 points    Immunizations Immunization History  Administered Date(s) Administered   Influenza Split 10/25/2011   Influenza, Quadrivalent, Recombinant, Inj, Pf 09/06/2020   Influenza, Seasonal, Injecte, Preservative Fre 10/30/2012   Influenza,inj,Quad PF,6+ Mos 11/12/2013, 07/14/2014, 07/14/2015, 09/13/2016, 09/05/2017, 08/21/2018, 09/24/2019   Influenza-Unspecified 03/19/2021   PNEUMOCOCCAL CONJUGATE-20 04/19/2021   Pneumococcal Polysaccharide-23 02/21/2012, 06/06/2017   Tdap 10/25/2011    TDAP status: Due, Education has been provided regarding the  importance of this vaccine. Advised may receive this vaccine at local pharmacy or Health Dept. Aware to provide a copy of the vaccination record if obtained from local pharmacy or Health Dept. Verbalized acceptance and understanding.  Flu Vaccine status: Up to date  Pneumococcal vaccine status: Up to date  Covid-19 vaccine status: Declined, Education has been provided regarding the importance of this vaccine but patient still declined. Advised may receive this vaccine at local pharmacy or Health Dept.or vaccine clinic. Aware to provide a copy of the vaccination record if obtained from local pharmacy  or Health Dept. Verbalized acceptance and understanding.  Qualifies for Shingles Vaccine? Yes   Zostavax completed No   Shingrix Completed?: Yes  Screening Tests Health Maintenance  Topic Date Due   COVID-19 Vaccine (1) 05/05/2022 (Originally 12/09/1956)   Zoster Vaccines- Shingrix (1 of 2) 07/20/2022 (Originally 06[birth year]09/1975)   MAMMOGRAM  04/20/2023 (Originally 06/08/2006)   TETANUS/TDAP  04/20/2023 (Originally 10/24/2021)   INFLUENZA VACCINE  06/05/2022   COLONOSCOPY (Pts 45-18yr Insurance coverage will need to be confirmed)  06/12/2024   Pneumonia Vaccine 66 Years old  Completed   DEXA SCAN  Completed   Hepatitis C Screening  Completed   HIV Screening  Completed   HPV VACCINES  Aged Out    Health Maintenance  There are no preventive care reminders to display for this patient.  Colorectal cancer screening: Type of screening: Colonoscopy. Completed 06/12/21. Repeat every 3-5 years  Mammogram status: Ordered not placed. Pt declined. Pt provided with contact info and advised to call to schedule appt.   Bone Density status: Completed 08/22. Results reflect: Bone density results: OSTEOPENIA. Repeat every 2 years.  Lung Cancer Screening: (Low Dose CT Chest recommended if Age 66-80years, 30 pack-year currently smoking OR have quit w/in 15years.) does qualify.   Lung Cancer Screening  Referral: declined  Additional Screening:  Hepatitis C Screening: does qualify; Completed 01/05/2021  Vision Screening: Recommended annual ophthalmology exams for early detection of glaucoma and other disorders of the eye. Is the patient up to date with their annual eye exam?  No  Who is the provider or what is the name of the office in which the patient attends annual eye exams? [facility]  Dental Screening: Recommended annual dental exams for proper oral hygiene  Community Resource Referral / Chronic Care Management: CRR required this visit?  No   CCM required this visit?  No      Plan:     I have personally reviewed and noted the following in the patient's chart:   Medical and social history Use of alcohol, tobacco or illicit drugs  Current medications and supplements including opioid prescriptions. Patient is currently taking opioid prescriptions. Information provided to patient regarding non-opioid alternatives. Patient advised to discuss non-opioid treatment plan with their provider. Functional ability and status Nutritional status Physical activity Advanced directives List of other physicians Hospitalizations, surgeries, and ER visits in previous 12 months Vitals Screenings to include cognitive, depression, and falls Referrals and appointments  In addition, I have reviewed and discussed with patient certain preventive protocols, quality metrics, and best practice recommendations. A written personalized care plan for preventive services as well as general preventive health recommendations were provided to patient.     [facility]  04/21/2022   Nurse Notes: Non face to face 20 minutes.  Ms. SNesler, Thank you for taking time to come for your Medicare Wellness Visit. I appreciate your ongoing commitment to your health goals. Please review the following plan we discussed and let me know if I can assist you in the future.   These are the goals we discussed:   Goals      Weight (lb) < 180 lb (81.6 kg)     Lose weight by increase walking.         This is a list of the screening recommended for you and due dates:  Health Maintenance  Topic Date Due   COVID-19 Vaccine (1) 05/05/2022*   Zoster (Shingles) Vaccine (1 of 2) 07/20/2022*   Mammogram  04/20/2023*  Tetanus Vaccine  04/20/2023*   Flu Shot  06/05/2022   Colon Cancer Screening  06/12/2024   Pneumonia Vaccine  Completed   DEXA scan (bone density measurement)  Completed   Hepatitis C Screening: USPSTF Recommendation to screen - Ages 17-79 yo.  Completed   HIV Screening  Completed   HPV Vaccine  Aged Out  *Topic was postponed. The date shown is not the original due date.

## 2022-04-23 ENCOUNTER — Telehealth: Payer: Self-pay

## 2022-04-23 NOTE — Telephone Encounter (Signed)
-----   Message from Tammi Sou, MD sent at 04/23/2022 12:12 PM EDT ----- Can you add intact PTH to this lab?  Diagnosis hypercalcemia.

## 2022-04-23 NOTE — Telephone Encounter (Signed)
Brenda Sou, MD  04/23/2022 12:13 PM EDT Back to Top    And can you add on phosphorus level?  Diagnosis hypercalcemia

## 2022-05-04 ENCOUNTER — Ambulatory Visit: Payer: Medicare HMO | Attending: Pulmonary Disease | Admitting: Pulmonary Disease

## 2022-05-04 DIAGNOSIS — G4734 Idiopathic sleep related nonobstructive alveolar hypoventilation: Secondary | ICD-10-CM | POA: Diagnosis not present

## 2022-05-11 ENCOUNTER — Other Ambulatory Visit: Payer: Self-pay

## 2022-05-11 MED ORDER — OXYCODONE HCL 5 MG PO TABS: ORAL_TABLET | ORAL | 0 refills | Status: DC

## 2022-05-11 NOTE — Telephone Encounter (Signed)
Requesting: oxycodone Contract:01/16/22 UDS:01/17/22 Last Visit:04/19/22 Next Visit:07/19/22 Last Refill:04/09/22 (180,0)  Please Advise

## 2022-05-21 ENCOUNTER — Telehealth: Payer: Self-pay | Admitting: Pulmonary Disease

## 2022-05-21 DIAGNOSIS — E669 Obesity, unspecified: Secondary | ICD-10-CM | POA: Diagnosis not present

## 2022-05-21 DIAGNOSIS — M0609 Rheumatoid arthritis without rheumatoid factor, multiple sites: Secondary | ICD-10-CM | POA: Diagnosis not present

## 2022-05-21 DIAGNOSIS — Z79899 Other long term (current) drug therapy: Secondary | ICD-10-CM | POA: Diagnosis not present

## 2022-05-21 DIAGNOSIS — R748 Abnormal levels of other serum enzymes: Secondary | ICD-10-CM | POA: Diagnosis not present

## 2022-05-21 DIAGNOSIS — I2510 Atherosclerotic heart disease of native coronary artery without angina pectoris: Secondary | ICD-10-CM | POA: Diagnosis not present

## 2022-05-21 DIAGNOSIS — M549 Dorsalgia, unspecified: Secondary | ICD-10-CM | POA: Diagnosis not present

## 2022-05-21 DIAGNOSIS — J449 Chronic obstructive pulmonary disease, unspecified: Secondary | ICD-10-CM | POA: Diagnosis not present

## 2022-05-21 DIAGNOSIS — M1990 Unspecified osteoarthritis, unspecified site: Secondary | ICD-10-CM | POA: Diagnosis not present

## 2022-05-21 DIAGNOSIS — M79643 Pain in unspecified hand: Secondary | ICD-10-CM | POA: Diagnosis not present

## 2022-05-21 LAB — COMPREHENSIVE METABOLIC PANEL
Albumin: 4 (ref 3.5–5.0)
Calcium: 10.2 (ref 8.7–10.7)
Globulin: 3.1
eGFR: 76

## 2022-05-21 LAB — BASIC METABOLIC PANEL
BUN: 16 (ref 4–21)
CO2: 30 — AB (ref 13–22)
Chloride: 104 (ref 99–108)
Creatinine: 0.8 (ref 0.5–1.1)
Glucose: 110
Potassium: 4.1 mEq/L (ref 3.5–5.1)
Sodium: 143 (ref 137–147)

## 2022-05-21 LAB — CBC AND DIFFERENTIAL
HCT: 40 (ref 36–46)
Hemoglobin: 13.6 (ref 12.0–16.0)
Neutrophils Absolute: 47.4
Platelets: 163 10*3/uL (ref 150–400)

## 2022-05-21 LAB — HEPATIC FUNCTION PANEL
ALT: 66 U/L — AB (ref 7–35)
AST: 77 — AB (ref 13–35)
Alkaline Phosphatase: 94 (ref 25–125)
Bilirubin, Total: 0.7

## 2022-05-21 LAB — POCT ERYTHROCYTE SEDIMENTATION RATE, NON-AUTOMATED: Sed Rate: 9

## 2022-05-21 NOTE — Procedures (Signed)
POLYSOMNOGRAPHY  Last, First: Brenda [name], Brenda [name] MRN: [id number] Gender: Female Age (years): 66 Weight (lbs): 202 DOB: January 15, 1956 BMI: 34 Primary Care: No PCP Epworth Score: N/A Referring: Larey Days MD Technician: Soundra Pilon Interpreting: Laurin Coder MD Study Type: NPSG Ordered Study Type: Split Night CPAP Study date: 05/04/2022 Location: [facility] CLINICAL INFORMATION Brenda [name] is a 66 year old Female and was referred to the sleep center for evaluation of N/A. Indications include N/A.  MEDICATIONS Patient self administered medications include: N/A. Medications administered during study include No sleep medicine administered.  SLEEP STUDY TECHNIQUE A multi-channel overnight Polysomnography study was performed. The channels recorded and monitored were central and occipital EEG, electrooculogram (EOG), submentalis EMG (chin), nasal and oral airflow, thoracic and abdominal wall motion, anterior tibialis EMG, snore microphone, electrocardiogram, and a pulse oximetry. TECHNICIAN COMMENTS Comments added by Technician: Patient slept with a wedge at an incline. Comments added by Scorer: N/A SLEEP ARCHITECTURE The study was initiated at 9:40:07 PM and terminated at 5:11:02 AM. The total recorded time was 450.9 minutes. EEG confirmed total sleep time was 368 minutes yielding a sleep efficiency of 81.6%. Sleep onset after lights out was 8.9 minutes with a REM latency of 81.5 minutes. The patient spent 6.11% of the night in stage N1 sleep, 89.13% in stage N2 sleep, 1.49% in stage N3 and 3.3% in REM. Wake after sleep onset (WASO) was 74.0 minutes. The Arousal Index was 5.1/hour. RESPIRATORY PARAMETERS There were a total of 8 respiratory disturbances out of which 3 were apneas ( 0 obstructive, 0 mixed, 3 central) and 5 hypopneas. The apnea/hypopnea index (AHI) was 1.3 events/hour. The central sleep apnea index was 0.5 events/hour. The REM AHI was 5.0 events/hour and NREM AHI was  1.2 events/hour. The supine AHI was 1.3 events/hour and the non supine AHI was 0 supine during 100.00% of sleep. Respiratory disturbances were associated with oxygen desaturation down to a nadir of 82.00% during sleep. The mean oxygen saturation during the study was 89.25%. The cumulative time under 88% oxygen saturation was 120.9 minutes.  LEG MOVEMENT DATA The total leg movements were 0 with a resulting leg movement index of 0.00/hr .Associated arousal with leg movement index was 0.0/hr.  CARDIAC DATA The underlying cardiac rhythm was most consistent with sinus rhythm. Mean heart rate during sleep was 68.59 bpm. Additional rhythm abnormalities include PVCs.  IMPRESSIONS - No Significant Obstructive Sleep apnea(OSA) - Electrocardiographic data showed presence of PVCs. - The patient snored with soft snoring volume. - No significant periodic leg movements(PLMs) during sleep. However, no significant associated arousals.  DIAGNOSIS - Significant nocturnal hyypoxemia not confirmed during study. - Negative for significant obstructive sleep apnea - Patient slept with her Wedge with the head of the bed elevated  RECOMMENDATIONS - Avoid alcohol, sedatives and other CNS depressants that may worsen sleep apnea and disrupt normal sleep architecture. - Consider an overnight oximetry without oxygen supplementtation to confirm need for oxygen, this was not confirmed during tonight's study. - Sleep hygiene should be reviewed to assess factors that may improve sleep quality. - Weight management and regular exercise should be initiated or continued.  [Electronically signed] 05/21/2022 06:42 AM  Sherrilyn Rist MD NPI: [id number]

## 2022-05-21 NOTE — Telephone Encounter (Signed)
I called the patient and asked her to call back for sleep results.

## 2022-05-21 NOTE — Telephone Encounter (Signed)
Call patient  Sleep study result  Date of study: 05/04/2022  Impression: Nocturnal hypoxemia not confirmed during the study Patient slept with a wedge with the head of the bed elevated during the study No significant obstructive sleep apnea  Recommendation: Consider an overnight oximetry without oxygen supplementation to confirm need for oxygen, this was not confirmed during current study  Sleep hygiene should be reviewed to assess factors that may improve sleep quality  Avoid alcohol, sedatives and other CNS depressants that may worsen sleep apnea and disrupt normal sleep architecture  Follow-up as previously scheduled

## 2022-06-01 NOTE — Telephone Encounter (Signed)
Patient has an OV coming up to go over sleep results. Nothing further needed.

## 2022-06-12 ENCOUNTER — Other Ambulatory Visit: Payer: Self-pay

## 2022-06-12 NOTE — Telephone Encounter (Signed)
Patient refill request.  [facility]  oxyCODONE (OXY IR/ROXICODONE) 5 MG immediate release tablet [[id number]]

## 2022-06-12 NOTE — Telephone Encounter (Signed)
Requesting: Oxycodone Contract: 01/16/22 UDS: 01/17/22 Last Visit: 04/19/22 Next Visit: 07/19/22 Last Refill: 05/11/22(180,0)  Please Advise. Medication pending

## 2022-06-13 ENCOUNTER — Ambulatory Visit: Payer: Medicare HMO | Admitting: Pulmonary Disease

## 2022-06-13 ENCOUNTER — Other Ambulatory Visit: Payer: Self-pay | Admitting: *Deleted

## 2022-06-13 DIAGNOSIS — J432 Centrilobular emphysema: Secondary | ICD-10-CM

## 2022-06-13 MED ORDER — OXYCODONE HCL 5 MG PO TABS
ORAL_TABLET | ORAL | 0 refills | Status: DC
Start: 2022-06-13 — End: 2022-07-13

## 2022-06-13 NOTE — Telephone Encounter (Signed)
Pt advised refill sent.

## 2022-06-14 ENCOUNTER — Ambulatory Visit (INDEPENDENT_AMBULATORY_CARE_PROVIDER_SITE_OTHER): Payer: Medicare HMO | Admitting: Pulmonary Disease

## 2022-06-14 ENCOUNTER — Encounter: Payer: Self-pay | Admitting: Pulmonary Disease

## 2022-06-14 VITALS — BP 120/64 | HR 92 | Temp 98.5°F | Ht 60.0 in | Wt 209.0 lb

## 2022-06-14 DIAGNOSIS — J432 Centrilobular emphysema: Secondary | ICD-10-CM | POA: Diagnosis not present

## 2022-06-14 DIAGNOSIS — G4734 Idiopathic sleep related nonobstructive alveolar hypoventilation: Secondary | ICD-10-CM | POA: Diagnosis not present

## 2022-06-14 DIAGNOSIS — R0609 Other forms of dyspnea: Secondary | ICD-10-CM

## 2022-06-14 LAB — PULMONARY FUNCTION TEST
DL/VA % pred: 108 %
DL/VA: 4.48 ml/min/mmHg/L
DLCO cor % pred: 85 %
DLCO cor: 17.42 ml/min/mmHg
DLCO unc % pred: 85 %
DLCO unc: 17.42 ml/min/mmHg
FEF 25-75 Post: 2.62 L/sec
FEF 25-75 Pre: 2.15 L/sec
FEF2575-%Change-Post: 22 %
FEF2575-%Pred-Post: 122 %
FEF2575-%Pred-Pre: 100 %
FEV1-%Change-Post: 4 %
FEV1-%Pred-Post: 81 %
FEV1-%Pred-Pre: 77 %
FEV1-Post: 2.01 L
FEV1-Pre: 1.93 L
FEV1FVC-%Change-Post: -1 %
FEV1FVC-%Pred-Pre: 107 %
FEV6-%Change-Post: 6 %
FEV6-%Pred-Post: 79 %
FEV6-%Pred-Pre: 74 %
FEV6-Post: 2.47 L
FEV6-Pre: 2.33 L
FEV6FVC-%Pred-Post: 104 %
FEV6FVC-%Pred-Pre: 104 %
FVC-%Change-Post: 6 %
FVC-%Pred-Post: 75 %
FVC-%Pred-Pre: 71 %
FVC-Post: 2.47 L
FVC-Pre: 2.33 L
Post FEV1/FVC ratio: 81 %
Post FEV6/FVC ratio: 100 %
Pre FEV1/FVC ratio: 83 %
Pre FEV6/FVC Ratio: 100 %
RV % pred: 87 %
RV: 1.88 L
TLC % pred: 85 %
TLC: 4.43 L

## 2022-06-14 NOTE — Patient Instructions (Signed)
Full PFT performed today. °

## 2022-06-14 NOTE — Progress Notes (Signed)
Full PFT performed today. °

## 2022-06-14 NOTE — Progress Notes (Signed)
@Patient  ID: Brenda [name], female    DOB: 05-Jan-1956, 66 y.o.   MRN: [id number]  Chief Complaint  Patient presents with   Follow-up    Pt is here for follow up for centrilobular emphysema. Pt states that her breathing is doing a little bit better. Pt states that she is wearing her oxygen at night at is doing well. Pt is on Stilito daily and Albuterol as needed. Pt states the inhalers are helping her.     Referring provider: Tammi Sou, MD  HPI:   66 y.o. whom we are seeing in follow up for evaluation of nocturnal hypoxemia and dyspnea on exertion.  Overall, doing okay.  Using [employer] as prescribed.  Says it does feel like it helps some.  Wheezes gone.  Feels a bit less short of breath during the day.  She returns today and performed PFTs.  These were reviewed in detail with her that are largely normal.  Please see below for further in full interpretation.  HPI at initial visit: Certainly oxygen was low while hospitalized 11/2021.  This was for presumed COPD exacerbation.  Never had PFTs to confirm or deny the presence of COPD.  She was noted to drop her oxygen at night early morning when has been weaned off during the day.  11/10/2021 document 7 9% on room air at 3 AM, improved to 98% on 2 L.  She was not discharged on oxygen.  She had no hypoxemia during the day or with exertion at that time.  However it appears nocturnal hypoxemia was overlooked.  Since returning home, she has been wearing a smart watch.  She reports every night her oxygen saturation drops to the low 80s.  Has not been in the 70s but has been in the low 80s.  She endorses dyspnea on exertion.  Present for some time.  Worse on inclines or stairs.  No time of day where things are better or worse.  No position make things better or worse.  No seasonal environmental factors she can identify to make things better or worse.  Mild improvement with albuterol.  Not using maintenance inhaler.  Reviewed most recent chest x-ray  11/2021 that on my interpretation reveals clear lungs bilaterally.  Most recent cross-sectional imaging CT chest 05/2014 on review reveals mild emphysematous changes with predilection for the apices, with otherwise clear lungs on my interpretation.o   PMH: Tobacco abuse in remission, emphysema, CAD, hypertension, GERD Surgical history: Abdominal hysterectomy, appendectomy, knee arthroscopy, lumbar spine surgery, tonsillectomy Family history: Mother with CAD, hyperlipidemia, diabetes, breast cancer, father with CAD, CHF Social history: Former smoker, quit in 2015, 35-pack-year history, lives in [place] / Pulmonary Flowsheets:   ACT:      No data to display           MMRC:     No data to display           Epworth:      No data to display           Tests:   FENO:  No results found for: "NITRICOXIDE"  PFT:    Latest Ref Rng & Units 06/14/2022   11:51 AM  PFT Results  FVC-Pre L 2.33  P  FVC-Predicted Pre % 71  P  FVC-Post L 2.47  P  FVC-Predicted Post % 75  P  Pre FEV1/FVC % % 83  P  Post FEV1/FCV % % 81  P  FEV1-Pre L 1.93  P  FEV1-Predicted Pre % 77  P  FEV1-Post L 2.01  P  DLCO uncorrected ml/min/mmHg 17.42  P  DLCO UNC% % 85  P  DLCO corrected ml/min/mmHg 17.42  P  DLCO COR %Predicted % 85  P  DLVA Predicted % 108  P  TLC L 4.43  P  TLC % Predicted % 85  P  RV % Predicted % 87  P    P Preliminary result   Personally reviewed interpreted spirometry suggestive of mild restriction versus air trapping.  No bronchodilator response.  TLC within normal limits, no restriction, no evidence of air trapping on lung volumes.  DLCO within normal limits.  Normal PFTs.  WALK:      No data to display           Imaging: Personally reviewed and as per EMR No results found.  Lab Results: Personally reviewed CBC    Component Value Date/Time   WBC 11.0 (H) 04/19/2022 1434   RBC 4.39 04/19/2022 1434   HGB 13.9 04/19/2022 1434   HCT  41.5 04/19/2022 1434   PLT 148.0 (L) 04/19/2022 1434   MCV 94.5 04/19/2022 1434   MCH 31.7 11/10/2021 0606   MCHC 33.6 04/19/2022 1434   RDW 13.6 04/19/2022 1434   LYMPHSABS 4.5 (H) 04/19/2022 1434   MONOABS 0.7 04/19/2022 1434   EOSABS 0.2 04/19/2022 1434   BASOSABS 0.1 04/19/2022 1434    BMET    Component Value Date/Time   NA 145 04/19/2022 1434   NA 139 01/12/2022 0000   K 4.6 04/19/2022 1434   CL 104 04/19/2022 1434   CO2 32 04/19/2022 1434   GLUCOSE 97 04/19/2022 1434   BUN 14 04/19/2022 1434   BUN 10 01/12/2022 0000   CREATININE 0.80 04/19/2022 1434   CREATININE 0.69 06/29/2011 1607   CALCIUM 11.1 (H) 04/19/2022 1434   GFRNONAA >60 11/10/2021 0606   GFRAA 107 09/13/2021 0000    BNP    Component Value Date/Time   BNP 38.0 06/13/2019 1726    ProBNP    Component Value Date/Time   PROBNP 36.4 05/08/2014 1551    Specialty Problems       Pulmonary Problems   COPD (chronic obstructive pulmonary disease) (HCC)   Acute exacerbation of chronic obstructive pulmonary disease (COPD) (HCC)   Pneumonia due to COVID-19 virus   COPD exacerbation (HCC)   Hypoxia    Allergies  Allergen Reactions   Penicillins Anaphylaxis and Swelling    DID THE REACTION INVOLVE: Swelling of the face/tongue/throat, SOB, or low BP? Yes Sudden or severe rash/hives, skin peeling, or the inside of the mouth or nose? Yes Did it require medical treatment? Yes When did it last happen? 66 years old If all above answers are "NO", may proceed with cephalosporin use.    Aspirin Other (See Comments)     GI Bleed   Beta Adrenergic Blockers Other (See Comments)    REACTION: decreased libido   Citalopram Nausea And Vomiting   Nsaids Other (See Comments)     GI Upset   Other Hives and Itching    Walnuts    Plaquenil [Hydroxychloroquine Sulfate] Other (See Comments)    Tachycardia   Prochlorperazine Edisylate Other (See Comments)     Stroke like symptoms   Sulfonamide Derivatives Other (See  Comments)    Unknown allergic reaction   Amitriptyline Hcl Palpitations and Other (See Comments)     increased heart rate    Immunization History  Administered Date(s) Administered  Influenza Split 10/25/2011   Influenza, Quadrivalent, Recombinant, Inj, Pf 09/06/2020   Influenza, Seasonal, Injecte, Preservative Fre 10/30/2012   Influenza,inj,Quad PF,6+ Mos 11/12/2013, 07/14/2014, 07/14/2015, 09/13/2016, 09/05/2017, 08/21/2018, 09/24/2019   Influenza-Unspecified 03/19/2021   PNEUMOCOCCAL CONJUGATE-20 04/19/2021   Pneumococcal Polysaccharide-23 02/21/2012, 06/06/2017   Tdap 10/25/2011    Past Medical History:  Diagnosis Date   Allergy    Anxiety    Aortic valve stenosis    "very mild"   Atypical chest pain 2007; 2015   cardiolyte neg, echo nl, cath showed mild/nonobstructive LAD disease   Bilateral primary osteoarthritis of knee 06/30/2012   Right >L.  Right unicompartmental knee arthroplasty 10/25/18   CAD (coronary artery disease)    Chronic LBP    Multiple surgeries   COPD (chronic obstructive pulmonary disease) ([place])    COVID-19 virus infection 06/02/2019   Eval at [facility] ED and d/c'd home.  Admitted 06/12/19 with hypoxic RF and bilat infiltrates.   DDD (degenerative disc disease)    spinal stenosis   Elevated liver enzymes    2021 after starting methotrexate. 2022->rheum d/c'd her methotrexate   Her abd u/s 12/05/20 showed fatty liver, o/w normal.   Fatty liver    11/2020 liver u/s   Gastritis    H pylori NEG on EGD 02/2021   GERD (gastroesophageal reflux disease)    History of adenomatous polyp of colon 06/2021   2022, recall 5 yrs   History of GI bleed    NSAIDS   Hyperlipidemia    mixed   Microscopic hematuria    full w/ u unrevealing X 2   Normal nuclear stress test 11/11 and 06/2014   negative, EF normal   Oxygen deficiency    Just At night   Palpitations 2006   48H holter neg   Positive occult stool blood test    iFOB positive 04/2021->colonoscopy showed  multiple adenomatous polyps and nonbleeding int hem.   Prediabetes    Highest A1c 6.1% as of 03/2017   RBBB (right bundle branch block)    Recurrent UTI    Seronegative rheumatoid arthritis ([place]) 10/2019   multiple sites->pred started, methotrex started 10/2019; rheum to add humira as of 03/2020   Tobacco dependence in remission    Quit fall 2015.      Tobacco History: Social History   Tobacco Use  Smoking Status Former   Packs/day: 1.00   Years: 35.00   Total pack years: 35.00   Types: Cigarettes   Quit date: 07/22/2014   Years since quitting: 7.9  Smokeless Tobacco Never  Tobacco Comments   down to ~2 cigarettes/daily (06/23/14) - Quit Smoking around 07/20/14!   Counseling given: Not Answered Tobacco comments: down to ~2 cigarettes/daily (06/23/14) - Quit Smoking around 07/20/14!   Continue to not smoke  Outpatient Encounter Medications as of 06/14/2022  Medication Sig   acetaminophen (TYLENOL) 500 MG tablet Take 500 mg by mouth every 6 (six) hours as needed for fever.   Adalimumab (HUMIRA) 40 MG/0.8ML PSKT Inject 40 mg into the skin every 14 (fourteen) days.   albuterol (VENTOLIN HFA) 108 (90 Base) MCG/ACT inhaler Inhale 2 puffs into the lungs every 4 (four) hours as needed for wheezing or shortness of breath (bronchitis). Reported on 03/12/2016   Calcium Carb-Cholecalciferol (CALCIUM/VITAMIN D PO) Take 1 tablet by mouth daily.   clopidogrel (PLAVIX) 75 MG tablet TAKE 1 TABLET(75 MG) BY MOUTH DAILY   cyclobenzaprine (FLEXERIL) 10 MG tablet Take 1 tablet (10 mg total) by mouth every 8 (  eight) hours as needed.   fenofibrate 160 MG tablet TAKE 1 TABLET BY MOUTH EVERY DAY WITH FOOD   folic acid (FOLVITE) 1 MG tablet Take 1 mg by mouth daily.    lisinopril (ZESTRIL) 5 MG tablet TAKE 1 TABLET(5 MG) BY MOUTH DAILY   LORazepam (ATIVAN) 1 MG tablet TAKE 1 TABLET(1 MG) BY MOUTH TWICE DAILY   nitroGLYCERIN (NITROSTAT) 0.4 MG SL tablet Place 1 tablet (0.4 mg total) under the tongue every 5  (five) minutes as needed for chest pain (MAX 3 doses.).   oxyCODONE (OXY IR/ROXICODONE) 5 MG immediate release tablet 1-2 po tid pain   OXYGEN Inhale into the lungs. 2.5 L   pantoprazole (PROTONIX) 40 MG tablet TAKE 1 TABLET(40 MG) BY MOUTH DAILY   promethazine (PHENERGAN) 12.5 MG tablet 1-2 tabs po q6h prn nausea   rosuvastatin (CRESTOR) 40 MG tablet TAKE 1 TABLET(40 MG) BY MOUTH DAILY   Tiotropium Bromide-Olodaterol (STIOLTO RESPIMAT) 2.5-2.5 MCG/ACT AERS Inhale 2 puffs into the lungs daily.   No facility-administered encounter medications on file as of 06/14/2022.     Review of Systems  Review of Systems  N/a Physical Exam  BP 120/64 (BP Location: Left Arm, Patient Position: Sitting, Cuff Size: Normal)   Pulse 92   Temp 98.5 F (36.9 C) (Oral)   Ht 5' (1.524 m)   Wt 209 lb (94.8 kg)   SpO2 93%   BMI 40.82 kg/m   Wt Readings from Last 5 Encounters:  06/14/22 209 lb (94.8 kg)  04/19/22 202 lb 3.2 oz (91.7 kg)  03/13/22 202 lb (91.6 kg)  01/17/22 205 lb 6.4 oz (93.2 kg)  11/22/21 201 lb (91.2 kg)    BMI Readings from Last 5 Encounters:  06/14/22 40.82 kg/m  04/19/22 33.65 kg/m  03/13/22 33.61 kg/m  01/17/22 36.38 kg/m  11/22/21 35.61 kg/m     Physical Exam General: Well-appearing, no acute distress Eyes: EOMI, no icterus Neck: Supple, no JVP Pulmonary: Clear, normal work of breathing, no wheeze Cardiovascular: Regular rate and rhythm, no murmur Abdomen: Nondistended, bowel sounds present MSK: No synovitis, joint effusion Neuro: Normal gait, no weakness Psych: Normal mood, full affect   Assessment & Plan:   Nocturnal hypoxemia: Demonstrated when admitted for presumed COPD exacerbation (no PFTs to confirm COPD) 11/2021.  Desaturated 79% in the a.m. 11/10/2021.  Placed on 2 L with increase saturation to 98%.  She was not discharged with oxygen.  She continues report hypoxemia as noted by her smart watch throughout the night usually in the low 80s.  Nocturnal  oxygen ordered based on saturation in the hospital 11/2021.  Polysomnography negative for sleep apnea.  Suspect related to OHS.  Dyspnea on exertion: Stiolto has been helpful.  Emphysema on CT scan.  PFTs largely normal.  Continue Stiolto.  Return in about 6 months (around 12/15/2022).   Lanier Clam, MD 06/14/2022

## 2022-06-14 NOTE — Patient Instructions (Addendum)
Your DME is: [place] (medical equipment)   Nice to see you again  Your pulmonary function test today are largely normal  Continue Stiolto 2 puffs once a day  Continue using the oxygen at night  Check your oxygen saturation during the day and make sure it staying above 88%, if it seems like it is dropping please let us know we may need to find a solution for oxygen if you need to leave the house.  If is dropping below 88% please wear your oxygen.  We will place an order to adapt to see about getting humidification added to your oxygen.  Return to clinic in 6 months or sooner as needed with Dr. Silas Flood

## 2022-06-22 ENCOUNTER — Other Ambulatory Visit: Payer: Self-pay | Admitting: *Deleted

## 2022-06-22 NOTE — Patient Outreach (Signed)
  Care Coordination   06/22/2022 Name: Brenda [name] MRN: [id number] DOB: 1956-11-05   Care Coordination Outreach Attempts:  An unsuccessful telephone outreach was attempted today to offer the patient information about available care coordination services as a benefit of their health plan.   Follow Up Plan:  Additional outreach attempts will be made to offer the patient care coordination information and services.   Encounter Outcome:  No Answer  Care Coordination Interventions Activated:  No   Care Coordination Interventions:  No, not indicated    Raina Mina, RN Care Management Coordinator [place] Office [phone]

## 2022-06-27 ENCOUNTER — Ambulatory Visit: Payer: Self-pay

## 2022-06-27 NOTE — Patient Outreach (Signed)
  Care Coordination   06/27/2022 Name: Brenda [name] MRN: [id number] DOB: [birth year]   Care Coordination Outreach Attempts:  A second unsuccessful outreach was attempted today to offer the patient with information about available care coordination services as a benefit of their health plan.     Follow Up Plan:  Additional outreach attempts will be made to offer the patient care coordination information and services.   Encounter Outcome:  No Answer  Care Coordination Interventions Activated:  No   Care Coordination Interventions:  No, not indicated    Daneen Schick, BSW, CDP Social Worker, Certified Dementia Practitioner Care Coordination [phone]

## 2022-07-11 ENCOUNTER — Telehealth: Payer: Self-pay

## 2022-07-11 NOTE — Patient Outreach (Signed)
  Care Coordination   07/11/2022 Name: Brenda [name] MRN: [id number] DOB: February 13, 1956   Care Coordination Outreach Attempts:  A third unsuccessful outreach was attempted today to offer the patient with information about available care coordination services as a benefit of their health plan.   Follow Up Plan:  No further outreach attempts will be made at this time. We have been unable to contact the patient to offer or enroll patient in care coordination services  Encounter Outcome:  No Answer  Care Coordination Interventions Activated:  No   Care Coordination Interventions:  No, not indicated    Daneen Schick, BSW, CDP Social Worker, Certified Dementia Practitioner Care Coordination [phone]

## 2022-07-13 ENCOUNTER — Telehealth: Payer: Self-pay | Admitting: Family Medicine

## 2022-07-13 ENCOUNTER — Other Ambulatory Visit: Payer: Self-pay | Admitting: Family Medicine

## 2022-07-13 NOTE — Telephone Encounter (Signed)
Pt called and states she needs her pain medication refilled. (Oxycodone). She mentions that after Monday she will not have a way of getting it until next Thursday.

## 2022-07-16 MED ORDER — OXYCODONE HCL 5 MG PO TABS: ORAL_TABLET | ORAL | 0 refills | Status: DC

## 2022-07-16 NOTE — Telephone Encounter (Signed)
Requesting: oxycodone Contract: 01/16/22 UDS: 01/17/22 Last Visit: 04/19/22 Next Visit: 07/19/22 Last Refill: 06/13/22(180,0)  Please Advise. Med pending

## 2022-07-16 NOTE — Telephone Encounter (Signed)
Pt advised refill sent.

## 2022-07-16 NOTE — Telephone Encounter (Signed)
Patient made aware medication pending provider approval, we will follow up once this has been completed.

## 2022-07-19 ENCOUNTER — Encounter: Payer: Self-pay | Admitting: Family Medicine

## 2022-07-19 ENCOUNTER — Ambulatory Visit (INDEPENDENT_AMBULATORY_CARE_PROVIDER_SITE_OTHER): Payer: Medicare HMO | Admitting: Family Medicine

## 2022-07-19 VITALS — BP 123/73 | HR 76 | Temp 98.7°F | Ht 63.0 in | Wt 206.0 lb

## 2022-07-19 DIAGNOSIS — M159 Polyosteoarthritis, unspecified: Secondary | ICD-10-CM | POA: Diagnosis not present

## 2022-07-19 DIAGNOSIS — R7303 Prediabetes: Secondary | ICD-10-CM

## 2022-07-19 DIAGNOSIS — F419 Anxiety disorder, unspecified: Secondary | ICD-10-CM | POA: Diagnosis not present

## 2022-07-19 DIAGNOSIS — Z23 Encounter for immunization: Secondary | ICD-10-CM

## 2022-07-19 DIAGNOSIS — G894 Chronic pain syndrome: Secondary | ICD-10-CM | POA: Diagnosis not present

## 2022-07-19 DIAGNOSIS — K7581 Nonalcoholic steatohepatitis (NASH): Secondary | ICD-10-CM | POA: Diagnosis not present

## 2022-07-19 DIAGNOSIS — M150 Primary generalized (osteo)arthritis: Secondary | ICD-10-CM

## 2022-07-19 LAB — POCT GLYCOSYLATED HEMOGLOBIN (HGB A1C)
HbA1c POC (<> result, manual entry): 5.9 % (ref 4.0–5.6)
HbA1c, POC (controlled diabetic range): 5.9 % (ref 0.0–7.0)
HbA1c, POC (prediabetic range): 5.9 % (ref 5.7–6.4)
Hemoglobin A1C: 5.9 % — AB (ref 4.0–5.6)

## 2022-07-19 LAB — HEPATIC FUNCTION PANEL
ALT: 31 U/L (ref 0–35)
AST: 53 U/L — ABNORMAL HIGH (ref 0–37)
Albumin: 4.2 g/dL (ref 3.5–5.2)
Alkaline Phosphatase: 74 U/L (ref 39–117)
Bilirubin, Direct: 0.2 mg/dL (ref 0.0–0.3)
Total Bilirubin: 0.7 mg/dL (ref 0.2–1.2)
Total Protein: 7.3 g/dL (ref 6.0–8.3)

## 2022-07-19 LAB — PHOSPHORUS: Phosphorus: 3.8 mg/dL (ref 2.3–4.6)

## 2022-07-19 NOTE — Progress Notes (Signed)
OFFICE VISIT  07/19/2022  CC:  Chief Complaint  Patient presents with   PreDM    Rci; pt is fasting    HPI:    Patient is a 66 y.o. female who presents for 56-monthfollow-up chronic pain syndrome, prediabetes, and hypercalcemia. A/P as of last visit: "1 chronic pain syndrome. Stable. UDS UTD. Oxycodone 5 mg, 1-2 3 times daily as needed, #180 per mo-->new rx NOT needed today   #3 NASH. LFTs today.   #4 hyperlipidemia, mixed. Nonobstructive coronary artery disease--- would like LDL less than 70. LDL was 48 about 3 months ago .   #5 prediabetes.  Most recent A1c about 3 months ago was up to 0.2% Hemoglobin A1c today.   #6 Health maintenance exam: Reviewed age and gender appropriate health maintenance issues (prudent diet, regular exercise, health risks of tobacco and excessive alcohol, use of seatbelts, fire alarms in home, use of sunscreen).  Also reviewed age and gender appropriate health screening as well as vaccine recommendations. Vaccines: Shingrix-->she declines.  O/w UTD. Labs: cbc,cmet, hba1c Cervical ca screening:  no longer needed (hx of hysterectomy for benign dx) Breast ca screening: due for mammogram but she declines. Colon ca screening: recall 2025-2027 timeframe. Osteoporosis: DEXA 03/2021 via Rheum MD-->osteopenia.  Continue calcium and vitamin D."  INTERIM HX: Brenda [name] is doing fairly well, no changes in pain levels. She really does not even get out of her house, though.  She walks inside her house, not down steps.  She enjoys coloring and needlepoint and reading her Bible.   Chronic pain syndrome: Indication for chronic opioid: chronic bilat LBP w/out sciatica, and chronic pain from bilat osteoarthritis knees.  Also LBP and bilat shoulder pain.  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  She remains on Humira and is doing well on this.  She still takes 1-2 oxycodone 3 times a day and this sufficiently helps control her pain to allow for  maximum functioning and quality of life.  NSAID GI intolerance. Medication and dose: oxycodone 557m 1-2 tid prn. # pills per month: #180. PMP AWARE reviewed today: most recent rx for oxycodone was filled 06/13/2022, #180, rx by me. Most recent prescription for lorazepam was filled 06/23/2022, #60, prescription by me. No red flags.  Past Medical History:  Diagnosis Date   Allergy    Anxiety    Aortic valve stenosis    "very mild"   Atypical chest pain 2007; 2015   cardiolyte neg, echo nl, cath showed mild/nonobstructive LAD disease   Bilateral primary osteoarthritis of knee 06/30/2012   Right >L.  Right unicompartmental knee arthroplasty 10/25/18   CAD (coronary artery disease)    Chronic LBP    Multiple surgeries   COPD (chronic obstructive pulmonary disease) ([facility]   COVID-19 virus infection 06/02/2019   [facility]  Admitted 06/12/19 with hypoxic RF and bilat infiltrates.   DDD (degenerative disc disease)    spinal stenosis   Elevated liver enzymes    2021 after starting methotrexate. 2022->rheum d/c'd her methotrexate   Her abd u/s 12/05/20 showed fatty liver, o/w normal.   Fatty liver    11/2020 liver u/s   Gastritis    H pylori NEG on EGD 02/2021   GERD (gastroesophageal reflux disease)    History of adenomatous polyp of colon 06/2021   2022, recall 5 yrs   History of GI bleed    NSAIDS   Hyperlipidemia    mixed   Microscopic hematuria  full w/ u unrevealing X 2   Normal nuclear stress test 11/11 and 06/2014   negative, EF normal   Oxygen deficiency    Just At night   Palpitations 2006   48H holter neg   Positive occult stool blood test    iFOB positive 04/2021->colonoscopy showed multiple adenomatous polyps and nonbleeding int hem.   Prediabetes    Highest A1c 6.1% as of 03/2017   RBBB (right bundle branch block)    Recurrent UTI    Seronegative rheumatoid arthritis ([place]) 10/2019   multiple sites->pred started, methotrex started 10/2019; rheum to  add humira as of 03/2020   Tobacco dependence in remission    Quit fall 2015.      Past Surgical History:  Procedure Laterality Date   ABDOMINAL HYSTERECTOMY  1997   DUB   APPENDECTOMY  1984   BIOPSY  02/14/2021   Procedure: BIOPSY;  Surgeon: Eloise Harman, DO;  [facility];  Service: Endoscopy;;   CARDIAC CATHETERIZATION  10/09/2005   no CAD, mildly elevated LVEDP, normal LV function (Dr. Gerrie Nordmann)   CARDIAC CATHETERIZATION N/A 05/16/2015   Mild non-obstructive CAD--med mgmt.  Procedure: Left Heart Cath and Coronary Angiography;  Surgeon: Pixie Casino, MD;  Location: [facility];  Service: Cardiovascular;  Laterality: N/A;   CARDIOVASCULAR STRESS TEST  06/2014   normal lexiscan NST   CARDIOVASCULAR STRESS TEST  2006   persantine - no ischemia, low risk    COLONOSCOPY W/ POLYPECTOMY  06/12/2021   multiple adenomas--recall 3-5 yrs   COLONOSCOPY WITH PROPOFOL N/A 06/12/2021   Procedure: COLONOSCOPY WITH PROPOFOL;  Surgeon: Eloise Harman, DO;  [facility];  Service: Endoscopy;  Laterality: N/A;  8:30am   ESOPHAGOGASTRODUODENOSCOPY  02/14/2021   Gastritis ( H pylori NEG) w/out bleeding   ESOPHAGOGASTRODUODENOSCOPY (EGD) WITH PROPOFOL N/A 02/14/2021   Procedure: ESOPHAGOGASTRODUODENOSCOPY (EGD) WITH PROPOFOL;  Surgeon: Eloise Harman, DO;  [facility];  Service: Endoscopy;  Laterality: N/A;  AM   JOINT REPLACEMENT  11-05-2017   Partial right knee replacement   KNEE ARTHROSCOPY Bilateral    left wrist ganglion cyst excision  2010   [place]   right iliac crest bone graft+metal instrumentation; 2005 metal removal and decompression, 2011 lumbar decompression 4-11, then stabilization/ instrumentation done 09-19-10: L2,L3, L5 left and L2 , L3, L4 right pedical remnant L4 left embedded. Left iliac crest bone graft-- Dr Zollie Pee SPINE SURGERY  02/10/2016   Dr. Velna Ochs: lumbar decompression, instrumentation removal,  and fusion exploration--HELPED her a lot, esp her radicular leg pains.   OOPHORECTOMY Right    cyst   PARTIAL KNEE ARTHROPLASTY Right 11/04/2018   Procedure: UNICOMPARTMENTAL KNEE;  Surgeon: Renette Butters, MD;  Location: [facility];  Service: Orthopedics;  Laterality: Right;  Adductor Block   POLYPECTOMY  06/12/2021   Procedure: POLYPECTOMY;  Surgeon: Eloise Harman, DO;  [facility];  Service: Endoscopy;;   SPINE SURGERY  05-24-1991   Dr Velna Ochs surgeon [facility]. 2 level spinal fusion. 09-22-1998, 06-21-2004 took plates and screws out also got bone off of nerves. 2012 took out plates and screws and got bones off of nerves. 2017 2 level spinal fusion and scraped bones away from nerves.   TONSILLECTOMY  66 yrs old   TRANSTHORACIC ECHOCARDIOGRAM  2006   EF=>55%, trace MR, mild TR, trace AV regurg, trace pulm valve regurg    TUBAL LIGATION  Outpatient Medications Prior to Visit  Medication Sig Dispense Refill   acetaminophen (TYLENOL) 500 MG tablet Take 500 mg by mouth every 6 (six) hours as needed for fever.     Adalimumab (HUMIRA) 40 MG/0.8ML PSKT Inject 40 mg into the skin every 14 (fourteen) days.     albuterol (VENTOLIN HFA) 108 (90 Base) MCG/ACT inhaler Inhale 2 puffs into the lungs every 4 (four) hours as needed for wheezing or shortness of breath (bronchitis). Reported on 03/12/2016 18 g 1   Calcium Carb-Cholecalciferol (CALCIUM/VITAMIN D PO) Take 1 tablet by mouth daily.     clopidogrel (PLAVIX) 75 MG tablet TAKE 1 TABLET(75 MG) BY MOUTH DAILY 90 tablet 1   cyclobenzaprine (FLEXERIL) 10 MG tablet Take 1 tablet (10 mg total) by mouth every 8 (eight) hours as needed. 90 tablet 1   fenofibrate 160 MG tablet TAKE 1 TABLET BY MOUTH EVERY DAY WITH FOOD 90 tablet 1   folic acid (FOLVITE) 1 MG tablet Take 1 mg by mouth daily.      lisinopril (ZESTRIL) 5 MG tablet TAKE 1 TABLET(5 MG) BY MOUTH DAILY 90 tablet 1   LORazepam (ATIVAN) 1 MG tablet TAKE 1 TABLET(1 MG) BY MOUTH TWICE  DAILY 60 tablet 5   nitroGLYCERIN (NITROSTAT) 0.4 MG SL tablet Place 1 tablet (0.4 mg total) under the tongue every 5 (five) minutes as needed for chest pain (MAX 3 doses.). 10 tablet 1   oxyCODONE (OXY IR/ROXICODONE) 5 MG immediate release tablet 1-2 po tid pain 180 tablet 0   OXYGEN Inhale into the lungs. 2.5 L     pantoprazole (PROTONIX) 40 MG tablet TAKE 1 TABLET(40 MG) BY MOUTH DAILY 90 tablet 1   promethazine (PHENERGAN) 12.5 MG tablet 1-2 tabs po q6h prn nausea 20 tablet 2   rosuvastatin (CRESTOR) 40 MG tablet TAKE 1 TABLET(40 MG) BY MOUTH DAILY 90 tablet 1   Tiotropium Bromide-Olodaterol (STIOLTO RESPIMAT) 2.5-2.5 MCG/ACT AERS Inhale 2 puffs into the lungs daily. 1 each 11   No facility-administered medications prior to visit.    Allergies  Allergen Reactions   Penicillins Anaphylaxis and Swelling    DID THE REACTION INVOLVE: Swelling of the face/tongue/throat, SOB, or low BP? Yes Sudden or severe rash/hives, skin peeling, or the inside of the mouth or nose? Yes Did it require medical treatment? Yes When did it last happen? 66 years old If all above answers are "NO", may proceed with cephalosporin use.    Aspirin Other (See Comments)     GI Bleed   Beta Adrenergic Blockers Other (See Comments)    REACTION: decreased libido   Citalopram Nausea And Vomiting   Nsaids Other (See Comments)     GI Upset   Other Hives and Itching    Walnuts    Plaquenil [Hydroxychloroquine Sulfate] Other (See Comments)    Tachycardia   Prochlorperazine Edisylate Other (See Comments)     Stroke like symptoms   Sulfonamide Derivatives Other (See Comments)    Unknown allergic reaction   Amitriptyline Hcl Palpitations and Other (See Comments)     increased heart rate    ROS As per HPI  PE:    07/19/2022    1:40 PM 06/14/2022    1:27 PM 04/19/2022    1:58 PM  Vitals with BMI  Height _0  _1  _2   Weight 206 lbs 209 lbs 202 lbs 3 oz  BMI 36.5 10.17 51.02  Systolic 585 277 824   Diastolic 73 64 70  Pulse  76 92 87   Physical Exam  Gen: Alert, well appearing.  Patient is oriented to person, place, time, and situation.Marland Kitchena AFFECT: pleasant, lucid thought and speech. CV: RRR, no m/r/g.   LUNGS: CTA bilat, nonlabored resps, good aeration in all lung fields. EXT: no clubbing or cyanosis.  no edema.    LABS:  Last CBC Lab Results  Component Value Date   WBC 11.0 (H) 04/19/2022   HGB 13.9 04/19/2022   HCT 41.5 04/19/2022   MCV 94.5 04/19/2022   MCH 31.7 11/10/2021   RDW 13.6 04/19/2022   PLT 148.0 (L) 03/29/9101   Last metabolic panel Lab Results  Component Value Date   GLUCOSE 97 04/19/2022   NA 145 04/19/2022   K 4.6 04/19/2022   CL 104 04/19/2022   CO2 32 04/19/2022   BUN 14 04/19/2022   CREATININE 0.80 04/19/2022   EGFR 75 01/12/2022   CALCIUM 11.1 (H) 04/19/2022   PROT 7.1 04/19/2022   ALBUMIN 4.4 04/19/2022   LABGLOB 2.5 01/05/2021   AGRATIO 1.8 01/05/2021   BILITOT 0.8 04/19/2022   ALKPHOS 72 04/19/2022   AST 61 (H) 04/19/2022   ALT 49 (H) 04/19/2022   ANIONGAP 11 11/10/2021   Last lipids Lab Results  Component Value Date   CHOL 110 01/18/2022   HDL 39 (L) 01/18/2022   LDLCALC 48 01/18/2022   LDLDIRECT 172.2 07/14/2014   TRIG 148 01/18/2022   CHOLHDL 2.8 01/18/2022   Last hemoglobin A1c Lab Results  Component Value Date   HGBA1C 5.9 (A) 07/19/2022   HGBA1C 5.9 07/19/2022   HGBA1C 5.9 07/19/2022   HGBA1C 5.9 07/19/2022   Last thyroid functions Lab Results  Component Value Date   TSH 0.42 09/24/2019   Lab Results  Component Value Date   CALCIUM 11.1 (H) 04/19/2022   IMPRESSION AND PLAN:  1 chronic pain syndrome.  RA plus osteoarthritis multiple sites. Stable. UDS UTD. Oxycodone 5 mg, 1-2 3 times daily as needed, #180 per mo-->new rx NOT needed today   #3 NASH. Hepatic panel today.   #4 hyperlipidemia, mixed. Nonobstructive coronary artery disease--- would like LDL less than 70. LDL was 48 about 6 months ago  . Plan repeat 6 months.  #5 prediabetes.   POC Hba1c today is 5.9%, significantly improved.  6 hypercalcemia.  Calcium 11.1 last visit.  We will repeat today and check PTH and phosphorus.  7 chronic anxiety.  Lorazepam twice daily scheduled--continue this. New prescription was not needed today.  An After Visit Summary was printed and given to the patient.  FOLLOW UP: Return in about 3 months (around 10/18/2022) for routine chronic illness f/u. Next cpe 04/2023  Signed:  Crissie Sickles, MD           07/19/2022

## 2022-07-20 LAB — PTH, INTACT AND CALCIUM
Calcium: 10 mg/dL (ref 8.6–10.4)
PTH: 19 pg/mL (ref 16–77)

## 2022-08-15 ENCOUNTER — Ambulatory Visit: Payer: Medicare HMO

## 2022-08-16 ENCOUNTER — Other Ambulatory Visit: Payer: Self-pay | Admitting: Family Medicine

## 2022-08-16 NOTE — Telephone Encounter (Signed)
Pt called and stated she is in need of a refill for her OxyCodone.

## 2022-08-16 NOTE — Telephone Encounter (Signed)
Requesting: Oxycodone Contract: 01/16/22 UDS: 01/17/22 Last Visit: 07/19/22 Next Visit:10/18/22 Last Refill: 07/16/22 (180,0)  Please Advise. Med pending

## 2022-08-17 ENCOUNTER — Other Ambulatory Visit: Payer: Self-pay

## 2022-08-17 MED ORDER — CYCLOBENZAPRINE HCL 10 MG PO TABS: 10.0000 mg | ORAL_TABLET | Freq: Three times a day (TID) | ORAL | 1 refills | Status: DC | PRN

## 2022-08-17 MED ORDER — OXYCODONE HCL 5 MG PO TABS: ORAL_TABLET | ORAL | 0 refills | Status: DC

## 2022-09-01 ENCOUNTER — Other Ambulatory Visit: Payer: Self-pay | Admitting: Family Medicine

## 2022-09-07 ENCOUNTER — Other Ambulatory Visit: Payer: Self-pay

## 2022-09-07 MED ORDER — CLOPIDOGREL BISULFATE 75 MG PO TABS: ORAL_TABLET | ORAL | 1 refills | Status: DC

## 2022-09-07 MED ORDER — FENOFIBRATE 160 MG PO TABS: 160.0000 mg | ORAL_TABLET | Freq: Every day | ORAL | 1 refills | Status: DC

## 2022-09-07 MED ORDER — ROSUVASTATIN CALCIUM 40 MG PO TABS: ORAL_TABLET | ORAL | 1 refills | Status: DC

## 2022-09-18 ENCOUNTER — Other Ambulatory Visit: Payer: Self-pay | Admitting: Family Medicine

## 2022-09-18 NOTE — Telephone Encounter (Signed)
Requesting: oxycodone Contract: 01/16/22 UDS: 01/17/22 Last Visit: 07/19/22 Next Visit: 10/18/22 Last Refill: 08/17/22(180,0)  Please Advise. Med pending

## 2022-09-19 MED ORDER — OXYCODONE HCL 5 MG PO TABS: ORAL_TABLET | ORAL | 0 refills | Status: DC

## 2022-09-19 NOTE — Telephone Encounter (Signed)
Pt advised refill sent.

## 2022-09-24 DIAGNOSIS — I2510 Atherosclerotic heart disease of native coronary artery without angina pectoris: Secondary | ICD-10-CM | POA: Diagnosis not present

## 2022-09-24 DIAGNOSIS — R748 Abnormal levels of other serum enzymes: Secondary | ICD-10-CM | POA: Diagnosis not present

## 2022-09-24 DIAGNOSIS — E669 Obesity, unspecified: Secondary | ICD-10-CM | POA: Diagnosis not present

## 2022-09-24 DIAGNOSIS — M79643 Pain in unspecified hand: Secondary | ICD-10-CM | POA: Diagnosis not present

## 2022-09-24 DIAGNOSIS — M549 Dorsalgia, unspecified: Secondary | ICD-10-CM | POA: Diagnosis not present

## 2022-09-24 DIAGNOSIS — Z79899 Other long term (current) drug therapy: Secondary | ICD-10-CM | POA: Diagnosis not present

## 2022-09-24 DIAGNOSIS — J449 Chronic obstructive pulmonary disease, unspecified: Secondary | ICD-10-CM | POA: Diagnosis not present

## 2022-09-24 DIAGNOSIS — M1990 Unspecified osteoarthritis, unspecified site: Secondary | ICD-10-CM | POA: Diagnosis not present

## 2022-09-24 DIAGNOSIS — M0609 Rheumatoid arthritis without rheumatoid factor, multiple sites: Secondary | ICD-10-CM | POA: Diagnosis not present

## 2022-10-18 ENCOUNTER — Ambulatory Visit (INDEPENDENT_AMBULATORY_CARE_PROVIDER_SITE_OTHER): Payer: Medicare HMO | Admitting: Family Medicine

## 2022-10-18 ENCOUNTER — Encounter: Payer: Self-pay | Admitting: Family Medicine

## 2022-10-18 VITALS — BP 109/70 | HR 86 | Temp 98.0°F | Ht 63.0 in | Wt 220.0 lb

## 2022-10-18 DIAGNOSIS — M159 Polyosteoarthritis, unspecified: Secondary | ICD-10-CM | POA: Diagnosis not present

## 2022-10-18 DIAGNOSIS — R7303 Prediabetes: Secondary | ICD-10-CM

## 2022-10-18 DIAGNOSIS — I10 Essential (primary) hypertension: Secondary | ICD-10-CM | POA: Diagnosis not present

## 2022-10-18 DIAGNOSIS — E782 Mixed hyperlipidemia: Secondary | ICD-10-CM | POA: Diagnosis not present

## 2022-10-18 DIAGNOSIS — G894 Chronic pain syndrome: Secondary | ICD-10-CM

## 2022-10-18 LAB — COMPREHENSIVE METABOLIC PANEL
ALT: 27 U/L (ref 0–35)
AST: 35 U/L (ref 0–37)
Albumin: 4.2 g/dL (ref 3.5–5.2)
Alkaline Phosphatase: 57 U/L (ref 39–117)
BUN: 8 mg/dL (ref 6–23)
CO2: 30 mEq/L (ref 19–32)
Calcium: 9.8 mg/dL (ref 8.4–10.5)
Chloride: 107 mEq/L (ref 96–112)
Creatinine, Ser: 0.63 mg/dL (ref 0.40–1.20)
GFR: 92.48 mL/min (ref >60.00)
Glucose, Bld: 107 mg/dL — ABNORMAL HIGH (ref 70–99)
Potassium: 3.7 mEq/L (ref 3.5–5.1)
Sodium: 144 mEq/L (ref 135–145)
Total Bilirubin: 0.9 mg/dL (ref 0.2–1.2)
Total Protein: 6.6 g/dL (ref 6.0–8.3)

## 2022-10-18 LAB — LIPID PANEL
Cholesterol: 98 mg/dL (ref 0–200)
HDL: 41.8 mg/dL (ref >39.00)
LDL Cholesterol: 36 mg/dL (ref 0–99)
NonHDL: 55.88
Total CHOL/HDL Ratio: 2
Triglycerides: 99 mg/dL (ref 0.0–149.0)
VLDL: 19.8 mg/dL (ref 0.0–40.0)

## 2022-10-18 LAB — POCT GLYCOSYLATED HEMOGLOBIN (HGB A1C)
HbA1c POC (<> result, manual entry): 5.8 % (ref 4.0–5.6)
HbA1c, POC (controlled diabetic range): 5.8 % (ref 0.0–7.0)
HbA1c, POC (prediabetic range): 5.8 % (ref 5.7–6.4)
Hemoglobin A1C: 5.8 % — AB (ref 4.0–5.6)

## 2022-10-18 MED ORDER — TETANUS-DIPHTH-ACELL PERTUSSIS 5-2-15.5 LF-MCG/0.5 IM SUSP: 0.5000 mL | Freq: Once | INTRAMUSCULAR | 0 refills | Status: AC

## 2022-10-18 MED ORDER — PANTOPRAZOLE SODIUM 40 MG PO TBEC: DELAYED_RELEASE_TABLET | ORAL | 1 refills | Status: DC

## 2022-10-18 MED ORDER — OXYCODONE HCL 5 MG PO TABS: ORAL_TABLET | ORAL | 0 refills | Status: DC

## 2022-10-18 MED ORDER — ZOSTER VAC RECOMB ADJUVANTED 50 MCG/0.5ML IM SUSR: 0.5000 mL | Freq: Once | INTRAMUSCULAR | 0 refills | Status: AC

## 2022-10-18 MED ORDER — CYCLOBENZAPRINE HCL 10 MG PO TABS: 10.0000 mg | ORAL_TABLET | Freq: Three times a day (TID) | ORAL | 1 refills | Status: DC | PRN

## 2022-10-18 MED ORDER — LORAZEPAM 1 MG PO TABS: ORAL_TABLET | ORAL | 5 refills | Status: DC

## 2022-10-18 NOTE — Progress Notes (Signed)
OFFICE VISIT  10/18/2022  CC:  Chief Complaint  Patient presents with   Prediabetes   Chronic Pain Syndrome   Chronic Kidney Disease    Pt is fasting   Patient is a 66 y.o. female who presents for 39-monthfollow-up chronic pain syndrome, prediabetes, and chronic anxiety. A/P as of last visit: "chronic pain syndrome.  RA plus osteoarthritis multiple sites. Stable. UDS UTD. Oxycodone 5 mg, 1-2 3 times daily as needed, #180 per mo-->new rx NOT needed today   #3 NASH. Hepatic panel today.   #4 hyperlipidemia, mixed. Nonobstructive coronary artery disease--- would like LDL less than 70. LDL was 48 about 6 months ago . Plan repeat 6 months.   #5 prediabetes.   POC Hba1c today is 5.9%, significantly improved.   6 hypercalcemia.  Calcium 11.1 last visit.  We will repeat today and check PTH and phosphorus.   7 chronic anxiety.  Lorazepam twice daily scheduled--continue this. New prescription was not needed today."  INTERIM HX: All stable.  Pain control is adequate on current regimen. Anxiety level is minimal on current regimen.  Chronic pain syndrome: Indication for chronic opioid: chronic bilat LBP w/out sciatica, and chronic pain from bilat osteoarthritis knees.  Also LBP and bilat shoulder pain.  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  She remains on Humira and is doing well on this.  She still takes 1-2 oxycodone 3 times a day and this sufficiently helps control her pain to allow for maximum functioning and quality of life.  NSAID GI intolerance. Medication and dose: oxycodone 585m 1-2 tid prn. # pills per month: #180. PMP AWARE reviewed today: most recent rx for oxycodone was filled 09/19/2022, # 180, rx by me. Most recent prescription for lorazepam was filled 09/22/2022, #60, prescription by me. No red flags.  Past Medical History:  Diagnosis Date   Allergy    Anxiety    Aortic valve stenosis    "very mild"   Atypical chest pain 2007; 2015    cardiolyte neg, echo nl, cath showed mild/nonobstructive LAD disease   Bilateral primary osteoarthritis of knee 06/30/2012   Right >L.  Right unicompartmental knee arthroplasty 10/25/18   CAD (coronary artery disease)    Chronic LBP    Multiple surgeries   COPD (chronic obstructive pulmonary disease) ([place]   COVID-19 virus infection 06/02/2019   [facility]  Admitted 06/12/19 with hypoxic RF and bilat infiltrates.   DDD (degenerative disc disease)    spinal stenosis   Elevated liver enzymes    2021 after starting methotrexate. 2022->rheum d/c'd her methotrexate   Her abd u/s 12/05/20 showed fatty liver, o/w normal.   Fatty liver    11/2020 liver u/s   Gastritis    H pylori NEG on EGD 02/2021   GERD (gastroesophageal reflux disease)    History of adenomatous polyp of colon 06/2021   2022, recall 5 yrs   History of GI bleed    NSAIDS   Hyperlipidemia    mixed   Microscopic hematuria    full w/ u unrevealing X 2   Normal nuclear stress test 11/11 and 06/2014   negative, EF normal   Oxygen deficiency    Just At night   Palpitations 2006   48H holter neg   Positive occult stool blood test    iFOB positive 04/2021->colonoscopy showed multiple adenomatous polyps and nonbleeding int hem.   Prediabetes    Highest A1c 6.1% as of 03/2017  RBBB (right bundle branch block)    Recurrent UTI    Seronegative rheumatoid arthritis ([place]) 10/2019   multiple sites->pred started, methotrex started 10/2019; rheum to add humira as of 03/2020   Tobacco dependence in remission    Quit fall 2015.      Past Surgical History:  Procedure Laterality Date   ABDOMINAL HYSTERECTOMY  1997   DUB   APPENDECTOMY  1984   BIOPSY  02/14/2021   Procedure: BIOPSY;  Surgeon: Eloise Harman, DO;  [facility];  Service: Endoscopy;;   CARDIAC CATHETERIZATION  10/09/2005   no CAD, mildly elevated LVEDP, normal LV function (Dr. Gerrie Nordmann)   CARDIAC CATHETERIZATION N/A 05/16/2015    Mild non-obstructive CAD--med mgmt.  Procedure: Left Heart Cath and Coronary Angiography;  Surgeon: Pixie Casino, MD;  Location: [place] [facility];  Service: Cardiovascular;  Laterality: N/A;   CARDIOVASCULAR STRESS TEST  06/2014   normal lexiscan NST   CARDIOVASCULAR STRESS TEST  2006   persantine - no ischemia, low risk    COLONOSCOPY W/ POLYPECTOMY  06/12/2021   multiple adenomas--recall 3-5 yrs   COLONOSCOPY WITH PROPOFOL N/A 06/12/2021   Procedure: COLONOSCOPY WITH PROPOFOL;  Surgeon: Eloise Harman, DO;  [facility];  Service: Endoscopy;  Laterality: N/A;  8:30am   ESOPHAGOGASTRODUODENOSCOPY  02/14/2021   Gastritis ( H pylori NEG) w/out bleeding   ESOPHAGOGASTRODUODENOSCOPY (EGD) WITH PROPOFOL N/A 02/14/2021   Procedure: ESOPHAGOGASTRODUODENOSCOPY (EGD) WITH PROPOFOL;  Surgeon: Eloise Harman, DO;  [facility];  Service: Endoscopy;  Laterality: N/A;  AM   JOINT REPLACEMENT  11-05-2017   Partial right knee replacement   KNEE ARTHROSCOPY Bilateral    left wrist ganglion cyst excision  2010   [place]   right iliac crest bone graft+metal instrumentation; 2005 metal removal and decompression, 2011 lumbar decompression 4-11, then stabilization/ instrumentation done 09-19-10: L2,L3, L5 left and L2 , L3, L4 right pedical remnant L4 left embedded. Left iliac crest bone graft-- Dr Zollie Pee SPINE SURGERY  02/10/2016   Dr. Velna Ochs: lumbar decompression, instrumentation removal, and fusion exploration--HELPED her a lot, esp her radicular leg pains.   OOPHORECTOMY Right    cyst   PARTIAL KNEE ARTHROPLASTY Right 11/04/2018   Procedure: UNICOMPARTMENTAL KNEE;  Surgeon: Renette Butters, MD;  [facility];  Service: Orthopedics;  Laterality: Right;  Adductor Block   POLYPECTOMY  06/12/2021   Procedure: POLYPECTOMY;  Surgeon: Eloise Harman, DO;  [facility];  Service: Endoscopy;;   SPINE SURGERY  05-24-1991   Dr Velna Ochs  surgeon [facility] [place]. 2 level spinal fusion. 09-22-1998, 06-21-2004 took plates and screws out also got bone off of nerves. 2012 took out plates and screws and got bones off of nerves. 2017 2 level spinal fusion and scraped bones away from nerves.   TONSILLECTOMY  66 yrs old   TRANSTHORACIC ECHOCARDIOGRAM  2006   EF=>55%, trace MR, mild TR, trace AV regurg, trace pulm valve regurg    TUBAL LIGATION      Outpatient Medications Prior to Visit  Medication Sig Dispense Refill   Adalimumab (HUMIRA) 40 MG/0.8ML PSKT Inject 40 mg into the skin every 14 (fourteen) days.     Calcium Carb-Cholecalciferol (CALCIUM/VITAMIN D PO) Take 1 tablet by mouth daily.     clopidogrel (PLAVIX) 75 MG tablet TAKE 1 TABLET(75 MG) BY MOUTH DAILY 90 tablet 1   fenofibrate 160 MG tablet Take 1 tablet (160  mg total) by mouth daily. with food 90 tablet 1   folic acid (FOLVITE) 1 MG tablet Take 1 mg by mouth daily.      lisinopril (ZESTRIL) 5 MG tablet TAKE 1 TABLET(5 MG) BY MOUTH DAILY 90 tablet 1   OXYGEN Inhale into the lungs. 2.5 L     rosuvastatin (CRESTOR) 40 MG tablet TAKE 1 TABLET(40 MG) BY MOUTH DAILY 90 tablet 1   Tiotropium Bromide-Olodaterol (STIOLTO RESPIMAT) 2.5-2.5 MCG/ACT AERS Inhale 2 puffs into the lungs daily. 1 each 11   cyclobenzaprine (FLEXERIL) 10 MG tablet Take 1 tablet (10 mg total) by mouth every 8 (eight) hours as needed. 90 tablet 1   LORazepam (ATIVAN) 1 MG tablet TAKE 1 TABLET(1 MG) BY MOUTH TWICE DAILY 60 tablet 5   oxyCODONE (OXY IR/ROXICODONE) 5 MG immediate release tablet 1-2 po tid pain 180 tablet 0   pantoprazole (PROTONIX) 40 MG tablet TAKE 1 TABLET(40 MG) BY MOUTH DAILY 90 tablet 1   acetaminophen (TYLENOL) 500 MG tablet Take 500 mg by mouth every 6 (six) hours as needed for fever. (Patient not taking: Reported on 10/18/2022)     albuterol (VENTOLIN HFA) 108 (90 Base) MCG/ACT inhaler Inhale 2 puffs into the lungs every 4 (four) hours as needed for wheezing or shortness of breath (bronchitis).  Reported on 03/12/2016 (Patient not taking: Reported on 10/18/2022) 18 g 1   nitroGLYCERIN (NITROSTAT) 0.4 MG SL tablet Place 1 tablet (0.4 mg total) under the tongue every 5 (five) minutes as needed for chest pain (MAX 3 doses.). (Patient not taking: Reported on 10/18/2022) 10 tablet 1   promethazine (PHENERGAN) 12.5 MG tablet 1-2 tabs po q6h prn nausea (Patient not taking: Reported on 10/18/2022) 20 tablet 2   No facility-administered medications prior to visit.    Allergies  Allergen Reactions   Penicillins Anaphylaxis and Swelling    DID THE REACTION INVOLVE: Swelling of the face/tongue/throat, SOB, or low BP? Yes Sudden or severe rash/hives, skin peeling, or the inside of the mouth or nose? Yes Did it require medical treatment? Yes When did it last happen? 66 years old If all above answers are "NO", may proceed with cephalosporin use.    Aspirin Other (See Comments)     GI Bleed   Beta Adrenergic Blockers Other (See Comments)    REACTION: decreased libido   Citalopram Nausea And Vomiting   Nsaids Other (See Comments)     GI Upset   Other Hives and Itching    Walnuts    Plaquenil [Hydroxychloroquine Sulfate] Other (See Comments)    Tachycardia   Prochlorperazine Edisylate Other (See Comments)     Stroke like symptoms   Sulfonamide Derivatives Other (See Comments)    Unknown allergic reaction   Amitriptyline Hcl Palpitations and Other (See Comments)     increased heart rate    ROS As per HPI  PE:    10/18/2022    1:24 PM 07/19/2022    1:40 PM 06/14/2022    1:27 PM  Vitals with BMI  Height _0  _1  _2   Weight 220 lbs 206 lbs 209 lbs  BMI 38.98 96.2 22.97  Systolic 989 211 941  Diastolic 70 73 64  Pulse 86 76 92     Physical Exam  Gen: Alert, well appearing.  Patient is oriented to person, place, time, and situation. AFFECT: pleasant, lucid thought and speech. No further exam today.  LABS:  Last CBC Lab Results  Component Value Date   WBC  11.0 (H)  04/19/2022   HGB 13.9 04/19/2022   HCT 41.5 04/19/2022   MCV 94.5 04/19/2022   MCH 31.7 11/10/2021   RDW 13.6 04/19/2022   PLT 148.0 (L) 58/52/7782   Last metabolic panel Lab Results  Component Value Date   GLUCOSE 97 04/19/2022   NA 145 04/19/2022   K 4.6 04/19/2022   CL 104 04/19/2022   CO2 32 04/19/2022   BUN 14 04/19/2022   CREATININE 0.80 04/19/2022   EGFR 75 01/12/2022   CALCIUM 10.0 07/19/2022   PHOS 3.8 07/19/2022   PROT 7.3 07/19/2022   ALBUMIN 4.2 07/19/2022   LABGLOB 2.5 01/05/2021   AGRATIO 1.8 01/05/2021   BILITOT 0.7 07/19/2022   ALKPHOS 74 07/19/2022   AST 53 (H) 07/19/2022   ALT 31 07/19/2022   ANIONGAP 11 11/10/2021   Last lipids Lab Results  Component Value Date   CHOL 110 01/18/2022   HDL 39 (L) 01/18/2022   LDLCALC 48 01/18/2022   LDLDIRECT 172.2 07/14/2014   TRIG 148 01/18/2022   CHOLHDL 2.8 01/18/2022   Last hemoglobin A1c Lab Results  Component Value Date   HGBA1C 5.8 (A) 10/18/2022   HGBA1C 5.8 10/18/2022   HGBA1C 5.8 10/18/2022   HGBA1C 5.8 10/18/2022   Last thyroid functions Lab Results  Component Value Date   TSH 0.42 09/24/2019   Lab Results  Component Value Date   PTH 19 07/19/2022   CALCIUM 10.0 07/19/2022   PHOS 3.8 07/19/2022   IMPRESSION AND PLAN:  #1 chronic pain syndrome: Osteoarthritis, multiple sites.  Also seronegative rheumatoid arthritis.  Not NSAID candidate. Continue oxycodone 5 mg, 1-2 3 times daily as needed, #180--new prescription today. Urine drug screen and controlled substance contract up-to-date.  2.  Chronic anxiety, doing well on long-term use of lorazepam 1 mg twice daily.  Refilled medication today, #60, refill x 5.  3.  Prediabetes.  Hemoglobin A1c today 5.8%. Continue diet.  #4 hyperlipidemia, mixed.  Continue rosuvastatin 40 mg a day and fenofibrate 160 mg a day. Nonobstructive coronary artery disease--- would like LDL less than 70. LDL was 48 about 9 months ago. Lipid panel today.  An  After Visit Summary was printed and given to the patient.  FOLLOW UP: Return in about 3 months (around 01/17/2023) for routine chronic illness f/u. Next cpe 04/2023  Signed:  Crissie Sickles, MD           10/18/2022

## 2022-10-31 NOTE — Progress Notes (Signed)
This encounter was created in error - please disregard.

## 2022-11-20 ENCOUNTER — Other Ambulatory Visit: Payer: Self-pay | Admitting: Family Medicine

## 2022-11-20 MED ORDER — OXYCODONE HCL 5 MG PO TABS: ORAL_TABLET | ORAL | 0 refills | Status: DC

## 2022-11-20 NOTE — Telephone Encounter (Signed)
Pt advised refill sent. °

## 2022-11-20 NOTE — Telephone Encounter (Signed)
Requesting: oxycodone Contract: 01/16/22 UDS: 01/17/22 Last Visit: 10/18/22 Next Visit: 01/31/23 Last Refill: 10/18/22 (180,0)  Please Advise. Med pending

## 2022-11-28 ENCOUNTER — Other Ambulatory Visit: Payer: Self-pay

## 2022-11-28 MED ORDER — LISINOPRIL 5 MG PO TABS: ORAL_TABLET | ORAL | 0 refills | Status: DC

## 2022-12-18 ENCOUNTER — Other Ambulatory Visit: Payer: Self-pay | Admitting: Family Medicine

## 2022-12-18 DIAGNOSIS — Z1231 Encounter for screening mammogram for malignant neoplasm of breast: Secondary | ICD-10-CM

## 2022-12-21 ENCOUNTER — Other Ambulatory Visit: Payer: Self-pay | Admitting: Family Medicine

## 2022-12-24 MED ORDER — OXYCODONE HCL 5 MG PO TABS: ORAL_TABLET | ORAL | 0 refills | Status: DC

## 2022-12-24 NOTE — Telephone Encounter (Signed)
  oxyCODONE (OXY IR/ROXICODONE) 5 MG immediate release tablet   Pt called to check the status of her refill. I informed her that it looks like her message has not had the opportunity to be addressed yet. She is aware that we have the refill request.

## 2022-12-24 NOTE — Telephone Encounter (Signed)
Requesting: oxycodone Contract: 01/16/22 UDS: 01/17/22 Last Visit: 10/18/22 Next Visit: 01/31/23 Last Refill: 11/20/22 (180,0)  Please Advise. Med pending

## 2023-01-13 DIAGNOSIS — R092 Respiratory arrest: Secondary | ICD-10-CM | POA: Diagnosis not present

## 2023-01-13 DIAGNOSIS — J441 Chronic obstructive pulmonary disease with (acute) exacerbation: Secondary | ICD-10-CM | POA: Diagnosis not present

## 2023-01-21 DIAGNOSIS — R748 Abnormal levels of other serum enzymes: Secondary | ICD-10-CM | POA: Diagnosis not present

## 2023-01-21 DIAGNOSIS — M0609 Rheumatoid arthritis without rheumatoid factor, multiple sites: Secondary | ICD-10-CM | POA: Diagnosis not present

## 2023-01-21 DIAGNOSIS — Z79899 Other long term (current) drug therapy: Secondary | ICD-10-CM | POA: Diagnosis not present

## 2023-01-21 DIAGNOSIS — M1990 Unspecified osteoarthritis, unspecified site: Secondary | ICD-10-CM | POA: Diagnosis not present

## 2023-01-21 DIAGNOSIS — M549 Dorsalgia, unspecified: Secondary | ICD-10-CM | POA: Diagnosis not present

## 2023-01-21 DIAGNOSIS — I2510 Atherosclerotic heart disease of native coronary artery without angina pectoris: Secondary | ICD-10-CM | POA: Diagnosis not present

## 2023-01-21 DIAGNOSIS — M79643 Pain in unspecified hand: Secondary | ICD-10-CM | POA: Diagnosis not present

## 2023-01-21 DIAGNOSIS — M8580 Other specified disorders of bone density and structure, unspecified site: Secondary | ICD-10-CM | POA: Diagnosis not present

## 2023-01-21 DIAGNOSIS — E669 Obesity, unspecified: Secondary | ICD-10-CM | POA: Diagnosis not present

## 2023-01-21 LAB — HEPATIC FUNCTION PANEL
ALT: 44 U/L — AB (ref 7–35)
AST: 54 — AB (ref 13–35)
Alkaline Phosphatase: 87 (ref 25–125)
Bilirubin, Total: 0.7

## 2023-01-21 LAB — BASIC METABOLIC PANEL
BUN: 15 (ref 4–21)
CO2: 29 — AB (ref 13–22)
Chloride: 103 (ref 99–108)
Creatinine: 0.8 (ref 0.5–1.1)
Glucose: 131
Potassium: 4.2 mEq/L (ref 3.5–5.1)
Sodium: 142 (ref 137–147)

## 2023-01-21 LAB — COMPREHENSIVE METABOLIC PANEL
Albumin: 3.8 (ref 3.5–5.0)
Calcium: 9.5 (ref 8.7–10.7)
Globulin: 3.3
eGFR: 80

## 2023-01-21 LAB — POCT ERYTHROCYTE SEDIMENTATION RATE, NON-AUTOMATED: Sed Rate: 3

## 2023-01-25 ENCOUNTER — Other Ambulatory Visit: Payer: Self-pay | Admitting: Family Medicine

## 2023-01-25 MED ORDER — OXYCODONE HCL 5 MG PO TABS: ORAL_TABLET | ORAL | 0 refills | Status: DC

## 2023-01-25 NOTE — Telephone Encounter (Signed)
Pt advised refill sent. °

## 2023-01-25 NOTE — Telephone Encounter (Signed)
Requesting: oxycodone Contract: 01/17/22 UDS: 01/17/22  Last Visit: 10/18/22 Next Visit: 01/31/23 Last Refill: 12/24/22 (180,0)  Please Advise. Med pending

## 2023-01-31 ENCOUNTER — Encounter: Payer: Self-pay | Admitting: Family Medicine

## 2023-01-31 ENCOUNTER — Ambulatory Visit: Payer: Medicare PPO | Admitting: Family Medicine

## 2023-01-31 VITALS — BP 138/80 | HR 94 | Wt 213.2 lb

## 2023-01-31 DIAGNOSIS — M545 Low back pain, unspecified: Secondary | ICD-10-CM | POA: Diagnosis not present

## 2023-01-31 DIAGNOSIS — M25562 Pain in left knee: Secondary | ICD-10-CM

## 2023-01-31 DIAGNOSIS — G894 Chronic pain syndrome: Secondary | ICD-10-CM | POA: Diagnosis not present

## 2023-01-31 DIAGNOSIS — G8929 Other chronic pain: Secondary | ICD-10-CM

## 2023-01-31 DIAGNOSIS — M25561 Pain in right knee: Secondary | ICD-10-CM | POA: Diagnosis not present

## 2023-01-31 NOTE — Progress Notes (Signed)
OFFICE VISIT  01/31/2023  CC: Follow-up chronic pain  Patient is a 67 y.o. female who presents for 57-month follow-up chronic pain syndrome. A/P as of last visit: "1 chronic pain syndrome: Osteoarthritis, multiple sites.  Also seronegative rheumatoid arthritis.  Not NSAID candidate. Continue oxycodone 5 mg, 1-2 3 times daily as needed, #180--new prescription today. Urine drug screen and controlled substance contract up-to-date.   2.  Chronic anxiety, doing well on long-term use of lorazepam 1 mg twice daily.  Refilled medication today, #60, refill x 5.   3.  Prediabetes.  Hemoglobin A1c today 5.8%. Continue diet.   #4 hyperlipidemia, mixed.  Continue rosuvastatin 40 mg a day and fenofibrate 160 mg a day. Nonobstructive coronary artery disease--- would like LDL less than 70. LDL was 48 about 9 months ago. Lipid panel today."  INTERIM HX: She is doing pretty good. Her pain in her knees, low back, and shoulders is pretty stable. She is getting closer and closer to where she wants to get left knee replacement.  Chronic pain syndrome: Indication for chronic opioid: chronic bilat LBP w/out sciatica, and chronic pain from bilat osteoarthritis knees.  Also LBP and bilat shoulder pain.  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  She remains on Humira and is doing well on this.  She still takes 1-2 oxycodone 3 times a day and this sufficiently helps control her pain to allow for maximum functioning and quality of life.  NSAID GI intolerance. Medication and dose: oxycodone 5mg , 1-2 tid prn. PMP AWARE reviewed today: most recent rx for oxycodone was filled 01/25/2023, # 180, rx by me. No red flags.  History of right partial knee arthroplasty 2019.  Past Medical History:  Diagnosis Date   Allergy    Anxiety    Aortic valve stenosis    "very mild"   Atypical chest pain 2007; 2015   cardiolyte neg, echo nl, cath showed mild/nonobstructive LAD disease   Bilateral primary  osteoarthritis of knee 06/30/2012   Right >L.  Right unicompartmental knee arthroplasty 10/25/18   CAD (coronary artery disease)    Chronic LBP    Multiple surgeries   COPD (chronic obstructive pulmonary disease) ([place])    COVID-19 virus infection 06/02/2019   Eval at [facility] ED and d/c'd home.  Admitted 06/12/19 with hypoxic RF and bilat infiltrates.   DDD (degenerative disc disease)    spinal stenosis   Elevated liver enzymes    2021 after starting methotrexate. 2022->rheum d/c'd her methotrexate   Her abd u/s 12/05/20 showed fatty liver, o/w normal.   Fatty liver    11/2020 liver u/s   Gastritis    H pylori NEG on EGD 02/2021   GERD (gastroesophageal reflux disease)    History of adenomatous polyp of colon 06/2021   2022, recall 5 yrs   History of GI bleed    NSAIDS   Hyperlipidemia    mixed   Microscopic hematuria    full w/ u unrevealing X 2   Normal nuclear stress test 11/11 and 06/2014   negative, EF normal   Oxygen deficiency    Just At night   Palpitations 2006   48H holter neg   Positive occult stool blood test    iFOB positive 04/2021->colonoscopy showed multiple adenomatous polyps and nonbleeding int hem.   Prediabetes    Highest A1c 6.1% as of 03/2017   RBBB (right bundle branch block)    Recurrent UTI    Seronegative rheumatoid arthritis ([place]) 10/2019   multiple  sites->pred started, methotrex started 10/2019; rheum to add humira as of 03/2020   Tobacco dependence in remission    Quit fall 2015.      Past Surgical History:  Procedure Laterality Date   ABDOMINAL HYSTERECTOMY  1997   DUB   APPENDECTOMY  1984   BIOPSY  02/14/2021   Procedure: BIOPSY;  Surgeon: Eloise Harman, DO;  [facility];  Service: Endoscopy;;   CARDIAC CATHETERIZATION  10/09/2005   no CAD, mildly elevated LVEDP, normal LV function (Dr. Gerrie Nordmann)   CARDIAC CATHETERIZATION N/A 05/16/2015   Mild non-obstructive CAD--med mgmt.  Procedure: Left Heart Cath and Coronary Angiography;   Surgeon: Pixie Casino, MD;  Location: [place] [facility];  Service: Cardiovascular;  Laterality: N/A;   CARDIOVASCULAR STRESS TEST  06/2014   normal lexiscan NST   CARDIOVASCULAR STRESS TEST  2006   persantine - no ischemia, low risk    COLONOSCOPY W/ POLYPECTOMY  06/12/2021   multiple adenomas--recall 3-5 yrs   COLONOSCOPY WITH PROPOFOL N/A 06/12/2021   Procedure: COLONOSCOPY WITH PROPOFOL;  Surgeon: Eloise Harman, DO;  [facility];  Service: Endoscopy;  Laterality: N/A;  8:30am   ESOPHAGOGASTRODUODENOSCOPY  02/14/2021   Gastritis ( H pylori NEG) w/out bleeding   ESOPHAGOGASTRODUODENOSCOPY (EGD) WITH PROPOFOL N/A 02/14/2021   Procedure: ESOPHAGOGASTRODUODENOSCOPY (EGD) WITH PROPOFOL;  Surgeon: Eloise Harman, DO;  [facility];  Service: Endoscopy;  Laterality: N/A;  AM   JOINT REPLACEMENT  11-05-2017   Partial right knee replacement   KNEE ARTHROSCOPY Bilateral    left wrist ganglion cyst excision  2010   [place]   right iliac crest bone graft+metal instrumentation; 2005 metal removal and decompression, 2011 lumbar decompression 4-11, then stabilization/ instrumentation done 09-19-10: L2,L3, L5 left and L2 , L3, L4 right pedical remnant L4 left embedded. Left iliac crest bone graft-- Dr Zollie Pee SPINE SURGERY  02/10/2016   Dr. Velna Ochs: lumbar decompression, instrumentation removal, and fusion exploration--HELPED her a lot, esp her radicular leg pains.   OOPHORECTOMY Right    cyst   PARTIAL KNEE ARTHROPLASTY Right 11/04/2018   Procedure: UNICOMPARTMENTAL KNEE;  Surgeon: Renette Butters, MD;  [facility];  Service: Orthopedics;  Laterality: Right;  Adductor Block   POLYPECTOMY  06/12/2021   Procedure: POLYPECTOMY;  Surgeon: Eloise Harman, DO;  [facility];  Service: Endoscopy;;   SPINE SURGERY  05-24-1991   Dr Velna Ochs surgeon [facility]. 2 level spinal fusion. 09-22-1998, 06-21-2004 took plates and screws out also  got bone off of nerves. 2012 took out plates and screws and got bones off of nerves. 2017 2 level spinal fusion and scraped bones away from nerves.   TONSILLECTOMY  67 yrs old   TRANSTHORACIC ECHOCARDIOGRAM  2006   EF=>55%, trace MR, mild TR, trace AV regurg, trace pulm valve regurg    TUBAL LIGATION      Outpatient Medications Prior to Visit  Medication Sig Dispense Refill   acetaminophen (TYLENOL) 500 MG tablet Take 500 mg by mouth every 6 (six) hours as needed for fever.     Adalimumab (HUMIRA) 40 MG/0.8ML PSKT Inject 40 mg into the skin every 14 (fourteen) days.     albuterol (VENTOLIN HFA) 108 (90 Base) MCG/ACT inhaler Inhale 2 puffs into the lungs every 4 (four) hours as needed for wheezing or shortness of breath (bronchitis). Reported on 03/12/2016 18 g 1   Calcium Carb-Cholecalciferol (CALCIUM/VITAMIN D  PO) Take 1 tablet by mouth daily.     clopidogrel (PLAVIX) 75 MG tablet TAKE 1 TABLET(75 MG) BY MOUTH DAILY 90 tablet 1   cyclobenzaprine (FLEXERIL) 10 MG tablet Take 1 tablet (10 mg total) by mouth every 8 (eight) hours as needed. 90 tablet 1   fenofibrate 160 MG tablet Take 1 tablet (160 mg total) by mouth daily. with food 90 tablet 1   folic acid (FOLVITE) 1 MG tablet Take 1 mg by mouth daily.      lisinopril (ZESTRIL) 5 MG tablet TAKE 1 TABLET(5 MG) BY MOUTH DAILY 90 tablet 0   LORazepam (ATIVAN) 1 MG tablet TAKE 1 TABLET(1 MG) BY MOUTH TWICE DAILY 60 tablet 5   nitroGLYCERIN (NITROSTAT) 0.4 MG SL tablet Place 1 tablet (0.4 mg total) under the tongue every 5 (five) minutes as needed for chest pain (MAX 3 doses.). 10 tablet 1   oxyCODONE (OXY IR/ROXICODONE) 5 MG immediate release tablet 1-2 po tid pain 180 tablet 0   OXYGEN Inhale into the lungs. 2.5 L     pantoprazole (PROTONIX) 40 MG tablet TAKE 1 TABLET(40 MG) BY MOUTH DAILY 90 tablet 1   rosuvastatin (CRESTOR) 40 MG tablet TAKE 1 TABLET(40 MG) BY MOUTH DAILY 90 tablet 1   Tiotropium Bromide-Olodaterol (STIOLTO RESPIMAT) 2.5-2.5  MCG/ACT AERS Inhale 2 puffs into the lungs daily. 1 each 11   No facility-administered medications prior to visit.    Allergies  Allergen Reactions   Penicillins Anaphylaxis and Swelling    DID THE REACTION INVOLVE: Swelling of the face/tongue/throat, SOB, or low BP? Yes Sudden or severe rash/hives, skin peeling, or the inside of the mouth or nose? Yes Did it require medical treatment? Yes When did it last happen? 67 years old If all above answers are "NO", may proceed with cephalosporin use.    Aspirin Other (See Comments)     GI Bleed   Beta Adrenergic Blockers Other (See Comments)    REACTION: decreased libido   Citalopram Nausea And Vomiting   Nsaids Other (See Comments)     GI Upset   Other Hives and Itching    Walnuts    Plaquenil [Hydroxychloroquine Sulfate] Other (See Comments)    Tachycardia   Prochlorperazine Edisylate Other (See Comments)     Stroke like symptoms   Sulfonamide Derivatives Other (See Comments)    Unknown allergic reaction   Amitriptyline Hcl Palpitations and Other (See Comments)     increased heart rate    Review of Systems As per HPI  PE:    01/31/2023    1:58 PM 10/18/2022    1:24 PM 07/19/2022    1:40 PM  Vitals with BMI  Height  5\' 3"  5\' 3"   Weight 213 lbs 3 oz 220 lbs 206 lbs  BMI  [id number]  Systolic 0000000 [id number]  Diastolic 80 70 73  Pulse 94 86 76     Physical Exam  Gen: Alert, well appearing.  Patient is oriented to person, place, time, and situation. AFFECT: pleasant, lucid thought and speech. CV: RRR, no m/r/g.   LUNGS: CTA bilat, nonlabored resps, good aeration in all lung fields. EXT: no clubbing or cyanosis.  no edema.    LABS:  Last CBC Lab Results  Component Value Date   WBC 11.0 (H) 04/19/2022   HGB 13.6 05/21/2022   HCT 40 05/21/2022   MCV 94.5 04/19/2022   MCH 31.7 11/10/2021   RDW 13.6 04/19/2022   PLT 163 05/21/2022  Last metabolic panel Lab Results  Component Value Date   GLUCOSE 107 (H)  10/18/2022   NA 142 01/21/2023   K 4.2 01/21/2023   CL 103 01/21/2023   CO2 29 (A) 01/21/2023   BUN 15 01/21/2023   CREATININE 0.8 01/21/2023   EGFR 80 01/21/2023   CALCIUM 9.5 01/21/2023   PHOS 3.8 07/19/2022   PROT 6.6 10/18/2022   ALBUMIN 3.8 01/21/2023   LABGLOB 2.5 01/05/2021   AGRATIO 1.8 01/05/2021   BILITOT 0.9 10/18/2022   ALKPHOS 87 01/21/2023   AST 54 (A) 01/21/2023   ALT 44 (A) 01/21/2023   ANIONGAP 11 11/10/2021   Last lipids Lab Results  Component Value Date   CHOL 98 10/18/2022   HDL 41.80 10/18/2022   LDLCALC 36 10/18/2022   LDLDIRECT 172.2 07/14/2014   TRIG 99.0 10/18/2022   CHOLHDL 2 10/18/2022   Last hemoglobin A1c Lab Results  Component Value Date   HGBA1C 5.8 (A) 10/18/2022   HGBA1C 5.8 10/18/2022   HGBA1C 5.8 10/18/2022   HGBA1C 5.8 10/18/2022   IMPRESSION AND PLAN:  #1 chronic pain syndrome: Chronic bilateral low back pain without sciatica, bilateral knee osteoarthritis, and chronic shoulder pain.  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 Recent rheumatology chemistry panel showed stable mild elevation of AST and ALT, otherwise normal. Will update controlled substance contract and urine drug screen at next follow-up visit in 3 months. Continue 1-2 5 mg oxycodone 3 times daily. No new prescription was needed today.  #2 prediabetes. Her last 2 hemoglobin A1c's over the last 6 months have been stable at 5.8 to 5.9%.   Plan next A1c 3 months.  Next lipids 3 months.  An After Visit Summary was printed and given to the patient.  FOLLOW UP: Return in about 3 months (around 05/03/2023) for annual CPE (fasting).  Signed:  Crissie Sickles, MD           01/31/2023

## 2023-02-13 DIAGNOSIS — R092 Respiratory arrest: Secondary | ICD-10-CM | POA: Diagnosis not present

## 2023-02-13 DIAGNOSIS — J441 Chronic obstructive pulmonary disease with (acute) exacerbation: Secondary | ICD-10-CM | POA: Diagnosis not present

## 2023-02-26 ENCOUNTER — Other Ambulatory Visit: Payer: Self-pay | Admitting: Family Medicine

## 2023-02-26 MED ORDER — LISINOPRIL 5 MG PO TABS: ORAL_TABLET | ORAL | 0 refills | Status: DC

## 2023-02-26 NOTE — Telephone Encounter (Signed)
Requesting:  oxycodone Contract: 01/17/22 UDS: 01/17/22 Last Visit: 01/31/23 Next Visit: 05/02/23 Last Refill: 01/25/23 (180,0)   RF request for lisinopril LOV: 01/31/23 Next ov: 05/02/23 Last written: 11/28/22 (90,0) refill sent 4/23   Please Advise. Only oxycodone pending for refill

## 2023-02-27 MED ORDER — OXYCODONE HCL 5 MG PO TABS: ORAL_TABLET | ORAL | 0 refills | Status: DC

## 2023-02-28 ENCOUNTER — Other Ambulatory Visit: Payer: Self-pay | Admitting: Family Medicine

## 2023-03-15 DIAGNOSIS — J441 Chronic obstructive pulmonary disease with (acute) exacerbation: Secondary | ICD-10-CM | POA: Diagnosis not present

## 2023-03-15 DIAGNOSIS — R092 Respiratory arrest: Secondary | ICD-10-CM | POA: Diagnosis not present

## 2023-03-18 ENCOUNTER — Encounter: Payer: Self-pay | Admitting: Pulmonary Disease

## 2023-03-18 ENCOUNTER — Ambulatory Visit (INDEPENDENT_AMBULATORY_CARE_PROVIDER_SITE_OTHER): Payer: Medicare PPO | Admitting: Pulmonary Disease

## 2023-03-18 VITALS — BP 120/70 | HR 89 | Ht 63.0 in | Wt 212.0 lb

## 2023-03-18 DIAGNOSIS — J432 Centrilobular emphysema: Secondary | ICD-10-CM

## 2023-03-18 DIAGNOSIS — G4734 Idiopathic sleep related nonobstructive alveolar hypoventilation: Secondary | ICD-10-CM | POA: Diagnosis not present

## 2023-03-18 MED ORDER — STIOLTO RESPIMAT 2.5-2.5 MCG/ACT IN AERS
2.0000 | INHALATION_SPRAY | Freq: Every day | RESPIRATORY_TRACT | 11 refills | Status: AC
Start: 2023-03-18 — End: ?

## 2023-03-18 NOTE — Addendum Note (Signed)
Addended by: Hedda Slade on: 03/18/2023 05:33 PM   Modules accepted: Orders

## 2023-03-18 NOTE — Patient Instructions (Addendum)
Nice to see you again  I refilled the Stiolto  Return to clinic in 1 year or sooner as needed

## 2023-03-18 NOTE — Progress Notes (Signed)
@Patient  ID: Brenda [name], female    DOB: [birth year], 67 y.o.   MRN: [id number]  Chief Complaint  Patient presents with   Follow-up    F/up on LOV    Referring provider: Jeoffrey Massed, MD  HPI:   67 y.o. whom we are seeing in follow up for evaluation of nocturnal hypoxemia and dyspnea on exertion.  Overall, doing okay.  Using [employer] as prescribed.  Says it does feel like it helps some.  Wheezes gone.  Feels a bit less short of breath during the day.  No major issues.  We did order humidification for oxygen 06/2022.  Confirmed receipt by home DME company/oxygen company.  However, this is yet to be delivered.  Also notes the oxygen concentrator is making a bit more noise than prior.  She continues to report good adherence to nocturnal oxygen.  Oxygen saturations today 95% and higher during the day without oxygen.  HPI at initial visit: Certainly oxygen was low while hospitalized 11/2021.  This was for presumed COPD exacerbation.  Never had PFTs to confirm or deny the presence of COPD.  She was noted to drop her oxygen at night early morning when has been weaned off during the day.  11/10/2021 document 7 9% on room air at 3 AM, improved to 98% on 2 L.  She was not discharged on oxygen.  She had no hypoxemia during the day or with exertion at that time.  However it appears nocturnal hypoxemia was overlooked.  Since returning home, she has been wearing a smart watch.  She reports every night her oxygen saturation drops to the low 80s.  Has not been in the 70s but has been in the low 80s.  She endorses dyspnea on exertion.  Present for some time.  Worse on inclines or stairs.  No time of day where things are better or worse.  No position make things better or worse.  No seasonal environmental factors she can identify to make things better or worse.  Mild improvement with albuterol.  Not using maintenance inhaler.  Reviewed most recent chest x-ray 11/2021 that on my interpretation reveals clear  lungs bilaterally.  Most recent cross-sectional imaging CT chest 05/2014 on review reveals mild emphysematous changes with predilection for the apices, with otherwise clear lungs on my interpretation.o   PMH: Tobacco abuse in remission, emphysema, CAD, hypertension, GERD Surgical history: Abdominal hysterectomy, appendectomy, knee arthroscopy, lumbar spine surgery, tonsillectomy Family history: Mother with CAD, hyperlipidemia, diabetes, breast cancer, father with CAD, CHF Social history: Former smoker, quit in 2015, 35-pack-year history, lives in [place] / Pulmonary Flowsheets:   ACT:      No data to display           MMRC:     No data to display           Epworth:      No data to display           Tests:   FENO:  No results found for: "NITRICOXIDE"  PFT:    Latest Ref Rng & Units 06/14/2022   11:51 AM  PFT Results  FVC-Pre L 2.33   FVC-Predicted Pre % 71   FVC-Post L 2.47   FVC-Predicted Post % 75   Pre FEV1/FVC % % 83   Post FEV1/FCV % % 81   FEV1-Pre L 1.93   FEV1-Predicted Pre % 77   FEV1-Post L 2.01   DLCO uncorrected ml/min/mmHg 17.42   DLCO  UNC% % 85   DLCO corrected ml/min/mmHg 17.42   DLCO COR %Predicted % 85   DLVA Predicted % 108   TLC L 4.43   TLC % Predicted % 85   RV % Predicted % 87   Personally reviewed interpreted spirometry suggestive of mild restriction versus air trapping.  No bronchodilator response.  TLC within normal limits, no restriction, no evidence of air trapping on lung volumes.  DLCO within normal limits.  Normal PFTs.  WALK:      No data to display           Imaging: Personally reviewed and as per EMR No results found.  Lab Results: Personally reviewed CBC    Component Value Date/Time   WBC 11.0 (H) 04/19/2022 1434   RBC 4.39 04/19/2022 1434   HGB 13.6 05/21/2022 0000   HCT 40 05/21/2022 0000   PLT 163 05/21/2022 0000   MCV 94.5 04/19/2022 1434   MCH 31.7 11/10/2021 0606   MCHC  33.6 04/19/2022 1434   RDW 13.6 04/19/2022 1434   LYMPHSABS 4.5 (H) 04/19/2022 1434   MONOABS 0.7 04/19/2022 1434   EOSABS 0.2 04/19/2022 1434   BASOSABS 0.1 04/19/2022 1434    BMET    Component Value Date/Time   NA 142 01/21/2023 0000   K 4.2 01/21/2023 0000   CL 103 01/21/2023 0000   CO2 29 (A) 01/21/2023 0000   GLUCOSE 107 (H) 10/18/2022 1414   BUN 15 01/21/2023 0000   CREATININE 0.8 01/21/2023 0000   CREATININE 0.63 10/18/2022 1414   CREATININE 0.69 06/29/2011 1607   CALCIUM 9.5 01/21/2023 0000   GFRNONAA >60 11/10/2021 0606   GFRAA 107 09/13/2021 0000    BNP    Component Value Date/Time   BNP 38.0 06/13/2019 1726    ProBNP    Component Value Date/Time   PROBNP 36.4 05/08/2014 1551    Specialty Problems       Pulmonary Problems   Pneumonia due to COVID-19 virus   Hypoxia    Allergies  Allergen Reactions   Penicillins Anaphylaxis and Swelling    DID THE REACTION INVOLVE: Swelling of the face/tongue/throat, SOB, or low BP? Yes Sudden or severe rash/hives, skin peeling, or the inside of the mouth or nose? Yes Did it require medical treatment? Yes When did it last happen? 67 years old If all above answers are "NO", may proceed with cephalosporin use.    Aspirin Other (See Comments)     GI Bleed   Beta Adrenergic Blockers Other (See Comments)    REACTION: decreased libido   Citalopram Nausea And Vomiting   Nsaids Other (See Comments)     GI Upset   Other Hives and Itching    Walnuts    Plaquenil [Hydroxychloroquine Sulfate] Other (See Comments)    Tachycardia   Prochlorperazine Edisylate Other (See Comments)     Stroke like symptoms   Sulfonamide Derivatives Other (See Comments)    Unknown allergic reaction   Amitriptyline Hcl Palpitations and Other (See Comments)     increased heart rate    Immunization History  Administered Date(s) Administered   Fluad Quad(high Dose 65+) 07/19/2022   Influenza Split 10/25/2011   Influenza, Quadrivalent,  Recombinant, Inj, Pf 09/06/2020   Influenza, Seasonal, Injecte, Preservative Fre 10/30/2012   Influenza,inj,Quad PF,6+ Mos 11/12/2013, 07/14/2014, 07/14/2015, 09/13/2016, 09/05/2017, 08/21/2018, 09/24/2019   Influenza-Unspecified 03/19/2021   PNEUMOCOCCAL CONJUGATE-20 04/19/2021   Pneumococcal Polysaccharide-23 02/21/2012, 06/06/2017   Tdap 10/25/2011    Past Medical History:  Diagnosis Date   Allergy    Anxiety    Aortic valve stenosis    "very mild"   Atypical chest pain 2007; 2015   cardiolyte neg, echo nl, cath showed mild/nonobstructive LAD disease   Bilateral primary osteoarthritis of knee 06/30/2012   Right >L.  Right unicompartmental knee arthroplasty 10/25/18   CAD (coronary artery disease)    Chronic LBP    Multiple surgeries   COPD (chronic obstructive pulmonary disease) (HCC)    COVID-19 virus infection 06/02/2019   Eval at [facility] ED and d/c'd home.  Admitted 06/12/19 with hypoxic RF and bilat infiltrates.   DDD (degenerative disc disease)    spinal stenosis   Elevated liver enzymes    2021 after starting methotrexate. 2022->rheum d/c'd her methotrexate   Her abd u/s 12/05/20 showed fatty liver, o/w normal.   Fatty liver    11/2020 liver u/s   Gastritis    H pylori NEG on EGD 02/2021   GERD (gastroesophageal reflux disease)    History of adenomatous polyp of colon 06/2021   2022, recall 5 yrs   History of GI bleed    NSAIDS   Hyperlipidemia    mixed   Microscopic hematuria    full w/ u unrevealing X 2   Normal nuclear stress test 11/11 and 06/2014   negative, EF normal   Oxygen deficiency    Just At night   Palpitations 2006   48H holter neg   Positive occult stool blood test    iFOB positive 04/2021->colonoscopy showed multiple adenomatous polyps and nonbleeding int hem.   Prediabetes    Highest A1c 6.1% as of 03/2017   RBBB (right bundle branch block)    Recurrent UTI    Seronegative rheumatoid arthritis (HCC) 10/2019   multiple sites->pred started, methotrex  started 10/2019; rheum to add humira as of 03/2020   Tobacco dependence in remission    Quit fall 2015.      Tobacco History: Social History   Tobacco Use  Smoking Status Former   Packs/day: 1.00   Years: 35.00   Additional pack years: 0.00   Total pack years: 35.00   Types: Cigarettes   Quit date: 07/22/2014   Years since quitting: 8.6  Smokeless Tobacco Never  Tobacco Comments   down to ~2 cigarettes/daily (06/23/14) - Quit Smoking around 07/20/14!   Counseling given: Not Answered Tobacco comments: down to ~2 cigarettes/daily (06/23/14) - Quit Smoking around 07/20/14!   Continue to not smoke  Outpatient Encounter Medications as of 03/18/2023  Medication Sig   acetaminophen (TYLENOL) 500 MG tablet Take 500 mg by mouth every 6 (six) hours as needed for fever.   Adalimumab (HUMIRA) 40 MG/0.8ML PSKT Inject 40 mg into the skin every 14 (fourteen) days.   albuterol (VENTOLIN HFA) 108 (90 Base) MCG/ACT inhaler Inhale 2 puffs into the lungs every 4 (four) hours as needed for wheezing or shortness of breath (bronchitis). Reported on 03/12/2016   Calcium Carb-Cholecalciferol (CALCIUM/VITAMIN D PO) Take 1 tablet by mouth daily.   clopidogrel (PLAVIX) 75 MG tablet TAKE 1 TABLET(75 MG) BY MOUTH DAILY   cyclobenzaprine (FLEXERIL) 10 MG tablet Take 1 tablet (10 mg total) by mouth every 8 (eight) hours as needed.   fenofibrate 160 MG tablet Take 1 tablet (160 mg total) by mouth daily. with food   folic acid (FOLVITE) 1 MG tablet Take 1 mg by mouth daily.    lisinopril (ZESTRIL) 5 MG tablet TAKE 1 TABLET(5 MG) BY MOUTH DAILY  LORazepam (ATIVAN) 1 MG tablet TAKE 1 TABLET(1 MG) BY MOUTH TWICE DAILY   nitroGLYCERIN (NITROSTAT) 0.4 MG SL tablet Place 1 tablet (0.4 mg total) under the tongue every 5 (five) minutes as needed for chest pain (MAX 3 doses.).   oxyCODONE (OXY IR/ROXICODONE) 5 MG immediate release tablet 1-2 po tid pain   OXYGEN Inhale into the lungs. 2.5 L   pantoprazole (PROTONIX) 40 MG  tablet TAKE 1 TABLET(40 MG) BY MOUTH DAILY   rosuvastatin (CRESTOR) 40 MG tablet TAKE 1 TABLET(40 MG) BY MOUTH DAILY   [DISCONTINUED] Tiotropium Bromide-Olodaterol (STIOLTO RESPIMAT) 2.5-2.5 MCG/ACT AERS Inhale 2 puffs into the lungs daily.   Tiotropium Bromide-Olodaterol (STIOLTO RESPIMAT) 2.5-2.5 MCG/ACT AERS Inhale 2 puffs into the lungs daily.   No facility-administered encounter medications on file as of 03/18/2023.     Review of Systems  Review of Systems  N/a Physical Exam  BP 120/70 (BP Location: Left Arm)   Pulse 89   Ht 5\' 3"  (1.6 m)   Wt 212 lb (96.2 kg)   SpO2 93%   BMI 37.55 kg/m   Wt Readings from Last 5 Encounters:  03/18/23 212 lb (96.2 kg)  01/31/23 213 lb 3.2 oz (96.7 kg)  10/18/22 220 lb (99.8 kg)  07/19/22 206 lb (93.4 kg)  06/14/22 209 lb (94.8 kg)    BMI Readings from Last 5 Encounters:  03/18/23 37.55 kg/m  01/31/23 37.77 kg/m  10/18/22 38.97 kg/m  07/19/22 36.49 kg/m  06/14/22 40.82 kg/m     Physical Exam General: Well-appearing, no acute distress Eyes: EOMI, no icterus Neck: Supple, no JVP Pulmonary: Clear, normal work of breathing, no wheeze Cardiovascular: Regular rate and rhythm, no murmur Abdomen: Nondistended, bowel sounds present MSK: No synovitis, joint effusion Neuro: Normal gait, no weakness Psych: Normal mood, full affect   Assessment & Plan:   Nocturnal hypoxemia: Demonstrated when admitted for presumed COPD exacerbation (no PFTs to confirm COPD) 11/2021.  Desaturated 79% in the a.m. 11/10/2021.  Placed on 2 L with increase saturation to 98%.  She was not discharged with oxygen.  She continues report hypoxemia as noted by her smart watch throughout the night usually in the low 80s.  Nocturnal oxygen ordered based on saturation in the hospital 11/2021.  Polysomnography negative for sleep apnea.  Suspect related to OHS.  Will send new order for maintenance of home oxygen concentrator as well as humidification that was sent 06/2022  but never delivered despite home health company confirming receipt of order.  Dyspnea on exertion: Stiolto has been helpful.  Emphysema on CT scan.  PFTs largely normal.  Continue Stiolto, refilled today.  Return in about 1 year (around 03/17/2024).   Karren Burly, MD 03/18/2023

## 2023-04-02 ENCOUNTER — Other Ambulatory Visit: Payer: Self-pay | Admitting: Family Medicine

## 2023-04-02 MED ORDER — OXYCODONE HCL 5 MG PO TABS: ORAL_TABLET | ORAL | 0 refills | Status: DC

## 2023-04-02 NOTE — Telephone Encounter (Signed)
Requesting: oxycodone Contract: 01/16/22 UDS: 01/17/22 Last Visit: 01/31/23 Next Visit: 05/02/23 Last Refill: 02/27/23 (180,0)  Please Advise. Med pending

## 2023-04-15 ENCOUNTER — Other Ambulatory Visit: Payer: Self-pay | Admitting: Family Medicine

## 2023-04-15 DIAGNOSIS — R092 Respiratory arrest: Secondary | ICD-10-CM | POA: Diagnosis not present

## 2023-04-15 DIAGNOSIS — J441 Chronic obstructive pulmonary disease with (acute) exacerbation: Secondary | ICD-10-CM | POA: Diagnosis not present

## 2023-04-17 ENCOUNTER — Ambulatory Visit (INDEPENDENT_AMBULATORY_CARE_PROVIDER_SITE_OTHER): Payer: Medicare PPO

## 2023-04-17 VITALS — BP 125/77 | HR 77 | Wt 203.0 lb

## 2023-04-17 DIAGNOSIS — Z0000 Encounter for general adult medical examination without abnormal findings: Secondary | ICD-10-CM | POA: Diagnosis not present

## 2023-04-17 NOTE — Patient Instructions (Signed)
Brenda [name] , Thank you for taking time to come for your Medicare Wellness Visit. I appreciate your ongoing commitment to your health goals. Please review the following plan we discussed and let me know if I can assist you in the future.   These are the goals we discussed:  Goals      Patient Stated     Get around better      Weight (lb) < 180 lb (81.6 kg)     Lose weight by increase walking.         This is a list of the screening recommended for you and due dates:  Health Maintenance  Topic Date Due   COVID-19 Vaccine (1) Never done   Zoster (Shingles) Vaccine (1 of 2) Never done   Screening for Lung Cancer  05/09/2015   DTaP/Tdap/Td vaccine (2 - Td or Tdap) 10/24/2021   Mammogram  04/20/2023*   Flu Shot  06/06/2023   Medicare Annual Wellness Visit  04/16/2024   Colon Cancer Screening  06/12/2024   Pneumonia Vaccine  Completed   DEXA scan (bone density measurement)  Completed   Hepatitis C Screening  Completed   HPV Vaccine  Aged Out  *Topic was postponed. The date shown is not the original due date.    Advanced directives: Advance directive discussed with you today. Even though you declined this today please call our office should you change your mind and we can give you the proper paperwork for you to fill out.  Conditions/risks identified: get to moving better   Next appointment: Follow up in one year for your annual wellness visit    Preventive Care 65 Years and Older, Female Preventive care refers to lifestyle choices and visits with your health care provider that can promote health and wellness. What does preventive care include? A yearly physical exam. This is also called an annual well check. Dental exams once or twice a year. Routine eye exams. Ask your health care provider how often you should have your eyes checked. Personal lifestyle choices, including: Daily care of your teeth and gums. Regular physical activity. Eating a healthy diet. Avoiding  tobacco and drug use. Limiting alcohol use. Practicing safe sex. Taking low-dose aspirin every day. Taking vitamin and mineral supplements as recommended by your health care provider. What happens during an annual well check? The services and screenings done by your health care provider during your annual well check will depend on your age, overall health, lifestyle risk factors, and family history of disease. Counseling  Your health care provider may ask you questions about your: Alcohol use. Tobacco use. Drug use. Emotional well-being. Home and relationship well-being. Sexual activity. Eating habits. History of falls. Memory and ability to understand (cognition). Work and work Astronomer. Reproductive health. Screening  You may have the following tests or measurements: Height, weight, and BMI. Blood pressure. Lipid and cholesterol levels. These may be checked every 5 years, or more frequently if you are over 10 years old. Skin check. Lung cancer screening. You may have this screening every year starting at age 47 if you have a 30-pack-year history of smoking and currently smoke or have quit within the past 15 years. Fecal occult blood test (FOBT) of the stool. You may have this test every year starting at age 26. Flexible sigmoidoscopy or colonoscopy. You may have a sigmoidoscopy every 5 years or a colonoscopy every 10 years starting at age 51. Hepatitis C blood test. Hepatitis B blood test. Sexually transmitted disease (STD)  testing. Diabetes screening. This is done by checking your blood sugar (glucose) after you have not eaten for a while (fasting). You may have this done every 1-3 years. Bone density scan. This is done to screen for osteoporosis. You may have this done starting at age 85. Mammogram. This may be done every 1-2 years. Talk to your health care provider about how often you should have regular mammograms. Talk with your health care provider about your test  results, treatment options, and if necessary, the need for more tests. Vaccines  Your health care provider may recommend certain vaccines, such as: Influenza vaccine. This is recommended every year. Tetanus, diphtheria, and acellular pertussis (Tdap, Td) vaccine. You may need a Td booster every 10 years. Zoster vaccine. You may need this after age 80. Pneumococcal 13-valent conjugate (PCV13) vaccine. One dose is recommended after age 20. Pneumococcal polysaccharide (PPSV23) vaccine. One dose is recommended after age 59. Talk to your health care provider about which screenings and vaccines you need and how often you need them. This information is not intended to replace advice given to you by your health care provider. Make sure you discuss any questions you have with your health care provider. Document Released: 11/18/2015 Document Revised: 07/11/2016 Document Reviewed: 08/23/2015 Elsevier Interactive Patient Education  2017 [employer] Prevention in the Home Falls can cause injuries. They can happen to people of all ages. There are many things you can do to make your home safe and to help prevent falls. What can I do on the outside of my home? Regularly fix the edges of walkways and driveways and fix any cracks. Remove anything that might make you trip as you walk through a door, such as a raised step or threshold. Trim any bushes or trees on the path to your home. Use bright outdoor lighting. Clear any walking paths of anything that might make someone trip, such as rocks or tools. Regularly check to see if handrails are loose or broken. Make sure that both sides of any steps have handrails. Any raised decks and porches should have guardrails on the edges. Have any leaves, snow, or ice cleared regularly. Use sand or salt on walking paths during winter. Clean up any spills in your garage right away. This includes oil or grease spills. What can I do in the bathroom? Use night  lights. Install grab bars by the toilet and in the tub and shower. Do not use towel bars as grab bars. Use non-skid mats or decals in the tub or shower. If you need to sit down in the shower, use a plastic, non-slip stool. Keep the floor dry. Clean up any water that spills on the floor as soon as it happens. Remove soap buildup in the tub or shower regularly. Attach bath mats securely with double-sided non-slip rug tape. Do not have throw rugs and other things on the floor that can make you trip. What can I do in the bedroom? Use night lights. Make sure that you have a light by your bed that is easy to reach. Do not use any sheets or blankets that are too big for your bed. They should not hang down onto the floor. Have a firm chair that has side arms. You can use this for support while you get dressed. Do not have throw rugs and other things on the floor that can make you trip. What can I do in the kitchen? Clean up any spills right away. Avoid walking on wet  floors. Keep items that you use a lot in easy-to-reach places. If you need to reach something above you, use a strong step stool that has a grab bar. Keep electrical cords out of the way. Do not use floor polish or wax that makes floors slippery. If you must use wax, use non-skid floor wax. Do not have throw rugs and other things on the floor that can make you trip. What can I do with my stairs? Do not leave any items on the stairs. Make sure that there are handrails on both sides of the stairs and use them. Fix handrails that are broken or loose. Make sure that handrails are as long as the stairways. Check any carpeting to make sure that it is firmly attached to the stairs. Fix any carpet that is loose or worn. Avoid having throw rugs at the top or bottom of the stairs. If you do have throw rugs, attach them to the floor with carpet tape. Make sure that you have a light switch at the top of the stairs and the bottom of the stairs. If  you do not have them, ask someone to add them for you. What else can I do to help prevent falls? Wear shoes that: Do not have high heels. Have rubber bottoms. Are comfortable and fit you well. Are closed at the toe. Do not wear sandals. If you use a stepladder: Make sure that it is fully opened. Do not climb a closed stepladder. Make sure that both sides of the stepladder are locked into place. Ask someone to hold it for you, if possible. Clearly mark and make sure that you can see: Any grab bars or handrails. First and last steps. Where the edge of each step is. Use tools that help you move around (mobility aids) if they are needed. These include: Canes. Walkers. Scooters. Crutches. Turn on the lights when you go into a dark area. Replace any light bulbs as soon as they burn out. Set up your furniture so you have a clear path. Avoid moving your furniture around. If any of your floors are uneven, fix them. If there are any pets around you, be aware of where they are. Review your medicines with your doctor. Some medicines can make you feel dizzy. This can increase your chance of falling. Ask your doctor what other things that you can do to help prevent falls. This information is not intended to replace advice given to you by your health care provider. Make sure you discuss any questions you have with your health care provider. Document Released: 08/18/2009 Document Revised: 03/29/2016 Document Reviewed: 11/26/2014 Elsevier Interactive Patient Education  2017 [employer].

## 2023-04-17 NOTE — Progress Notes (Signed)
I connected with  Brenda [name] on 04/17/23 by a audio enabled telemedicine application and verified that I am speaking with the correct person using two identifiers.  Patient Location: Home  Provider Location: Home Office  I discussed the limitations of evaluation and management by telemedicine. The patient expressed understanding and agreed to proceed.   Subjective:   Brenda [name] is a 67 y.o. female who presents for Medicare Annual (Subsequent) preventive examination.  Review of Systems     Cardiac Risk Factors include: advanced age (>55men, >87 women);dyslipidemia;obesity (BMI >30kg/m2);sedentary lifestyle;hypertension     Objective:    Today's Vitals   04/17/23 0805  BP: 125/77  Pulse: 77  Weight: 203 lb (92.1 kg)   Body mass index is 35.96 kg/m.     04/17/2023    8:12 AM 04/21/2022    9:14 AM 11/09/2021    2:32 PM 06/12/2021    6:55 AM 06/08/2021   11:12 AM 02/14/2021    7:02 AM 02/10/2021    9:16 AM  Advanced Directives  Does Patient Have a Medical Advance Directive? No No No No No No No  Would patient like information on creating a medical advance directive? No - Patient declined Yes (MAU/Ambulatory/Procedural Areas - Information given) No - Patient declined No - Patient declined No - Patient declined No - Patient declined Yes (MAU/Ambulatory/Procedural Areas - Information given)    Current Medications (verified) Outpatient Encounter Medications as of 04/17/2023  Medication Sig   acetaminophen (TYLENOL) 500 MG tablet Take 500 mg by mouth every 6 (six) hours as needed for fever.   Adalimumab (HUMIRA) 40 MG/0.8ML PSKT Inject 40 mg into the skin every 14 (fourteen) days.   albuterol (VENTOLIN HFA) 108 (90 Base) MCG/ACT inhaler Inhale 2 puffs into the lungs every 4 (four) hours as needed for wheezing or shortness of breath (bronchitis). Reported on 03/12/2016   Calcium Carb-Cholecalciferol (CALCIUM/VITAMIN D PO) Take 1 tablet by mouth daily.   clopidogrel (PLAVIX) 75 MG  tablet TAKE 1 TABLET(75 MG) BY MOUTH DAILY   cyclobenzaprine (FLEXERIL) 10 MG tablet Take 1 tablet (10 mg total) by mouth every 8 (eight) hours as needed for muscle spasms. OFFICE VISIT NEEDED FOR FURTHER REFILLS   fenofibrate 160 MG tablet Take 1 tablet (160 mg total) by mouth daily. with food   folic acid (FOLVITE) 1 MG tablet Take 1 mg by mouth daily.    lisinopril (ZESTRIL) 5 MG tablet TAKE 1 TABLET(5 MG) BY MOUTH DAILY   LORazepam (ATIVAN) 1 MG tablet TAKE 1 TABLET(1 MG) BY MOUTH TWICE DAILY   nitroGLYCERIN (NITROSTAT) 0.4 MG SL tablet Place 1 tablet (0.4 mg total) under the tongue every 5 (five) minutes as needed for chest pain (MAX 3 doses.).   oxyCODONE (OXY IR/ROXICODONE) 5 MG immediate release tablet 1-2 po tid pain   OXYGEN Inhale into the lungs. 2.5 L   pantoprazole (PROTONIX) 40 MG tablet TAKE 1 TABLET(40 MG) BY MOUTH DAILY   rosuvastatin (CRESTOR) 40 MG tablet TAKE 1 TABLET(40 MG) BY MOUTH DAILY   Tiotropium Bromide-Olodaterol (STIOLTO RESPIMAT) 2.5-2.5 MCG/ACT AERS Inhale 2 puffs into the lungs daily.   No facility-administered encounter medications on file as of 04/17/2023.    Allergies (verified) Penicillins, Aspirin, Beta adrenergic blockers, Citalopram, Nsaids, Other, Plaquenil [hydroxychloroquine sulfate], Prochlorperazine edisylate, Sulfonamide derivatives, and Amitriptyline hcl   History: Past Medical History:  Diagnosis Date   Allergy    Anxiety    Aortic valve stenosis    "very mild"   Atypical  chest pain 2007; 2015   cardiolyte neg, echo nl, cath showed mild/nonobstructive LAD disease   Bilateral primary osteoarthritis of knee 06/30/2012   Right >L.  Right unicompartmental knee arthroplasty 10/25/18   CAD (coronary artery disease)    Chronic LBP    Multiple surgeries   COPD (chronic obstructive pulmonary disease) (HCC)    COVID-19 virus infection 06/02/2019   Eval at [facility] ED and d/c'd home.  Admitted 06/12/19 with hypoxic RF and bilat infiltrates.   DDD  (degenerative disc disease)    spinal stenosis   Elevated liver enzymes    2021 after starting methotrexate. 2022->rheum d/c'd her methotrexate   Her abd u/s 12/05/20 showed fatty liver, o/w normal.   Fatty liver    11/2020 liver u/s   Gastritis    H pylori NEG on EGD 02/2021   GERD (gastroesophageal reflux disease)    History of adenomatous polyp of colon 06/2021   2022, recall 5 yrs   History of GI bleed    NSAIDS   Hyperlipidemia    mixed   Microscopic hematuria    full w/ u unrevealing X 2   Normal nuclear stress test 11/11 and 06/2014   negative, EF normal   Oxygen deficiency    Just At night   Palpitations 2006   48H holter neg   Positive occult stool blood test    iFOB positive 04/2021->colonoscopy showed multiple adenomatous polyps and nonbleeding int hem.   Prediabetes    Highest A1c 6.1% as of 03/2017   RBBB (right bundle branch block)    Recurrent UTI    Seronegative rheumatoid arthritis (HCC) 10/2019   multiple sites->pred started, methotrex started 10/2019; rheum to add humira as of 03/2020   Tobacco dependence in remission    Quit fall 2015.     Past Surgical History:  Procedure Laterality Date   ABDOMINAL HYSTERECTOMY  1997   DUB   APPENDECTOMY  1984   BIOPSY  02/14/2021   Procedure: BIOPSY;  Surgeon: Lanelle Bal, DO;  Location: [facility];  Service: Endoscopy;;   CARDIAC CATHETERIZATION  10/09/2005   no CAD, mildly elevated LVEDP, normal LV function (Dr. Laurell Josephs)   CARDIAC CATHETERIZATION N/A 05/16/2015   Mild non-obstructive CAD--med mgmt.  Procedure: Left Heart Cath and Coronary Angiography;  Surgeon: Chrystie Nose, MD;  Location: [facility] INVASIVE CV LAB;  Service: Cardiovascular;  Laterality: N/A;   CARDIOVASCULAR STRESS TEST  06/2014   normal lexiscan NST   CARDIOVASCULAR STRESS TEST  2006   persantine - no ischemia, low risk    COLONOSCOPY W/ POLYPECTOMY  06/12/2021   multiple adenomas--recall 3-5 yrs   COLONOSCOPY WITH PROPOFOL N/A 06/12/2021    Procedure: COLONOSCOPY WITH PROPOFOL;  Surgeon: Lanelle Bal, DO;  Location: [facility];  Service: Endoscopy;  Laterality: N/A;  8:30am   ESOPHAGOGASTRODUODENOSCOPY  02/14/2021   Gastritis ( H pylori NEG) w/out bleeding   ESOPHAGOGASTRODUODENOSCOPY (EGD) WITH PROPOFOL N/A 02/14/2021   Procedure: ESOPHAGOGASTRODUODENOSCOPY (EGD) WITH PROPOFOL;  Surgeon: Lanelle Bal, DO;  Location: [facility];  Service: Endoscopy;  Laterality: N/A;  AM   JOINT REPLACEMENT  11-05-2017   Partial right knee replacement   KNEE ARTHROSCOPY Bilateral    left wrist ganglion cyst excision  2010   LUMBAR SPINE SURGERY  1993   right iliac crest bone graft+metal instrumentation; 2005 metal removal and decompression, 2011 lumbar decompression 4-11, then stabilization/ instrumentation done 09-19-10: L2,L3, L5 left and L2 , L3, L4 right  pedical remnant L4 left embedded. Left iliac crest bone graft-- Dr Lily Lovings SPINE SURGERY  02/10/2016   Dr. Clotilde Dieter: lumbar decompression, instrumentation removal, and fusion exploration--HELPED her a lot, esp her radicular leg pains.   OOPHORECTOMY Right    cyst   PARTIAL KNEE ARTHROPLASTY Right 11/04/2018   Procedure: UNICOMPARTMENTAL KNEE;  Surgeon: Sheral Apley, MD;  Location: [facility];  Service: Orthopedics;  Laterality: Right;  Adductor Block   POLYPECTOMY  06/12/2021   Procedure: POLYPECTOMY;  Surgeon: Lanelle Bal, DO;  Location: [facility];  Service: Endoscopy;;   SPINE SURGERY  05-24-1991   Dr Clotilde Dieter surgeon [facility]. 2 level spinal fusion. 09-22-1998, 06-21-2004 took plates and screws out also got bone off of nerves. 2012 took out plates and screws and got bones off of nerves. 2017 2 level spinal fusion and scraped bones away from nerves.   TONSILLECTOMY  67 yrs old   TRANSTHORACIC ECHOCARDIOGRAM  2006   EF=>55%, trace MR, mild TR, trace AV regurg, trace pulm valve regurg    TUBAL LIGATION     Family History  Problem Relation Age of Onset    Heart Problems Mother        and thyroid problems   Hyperlipidemia Mother    Diabetes Mother    Breast cancer Mother    Arthritis Mother    Heart failure Father        CHF, heart attack, diabetes, hyperlipidemia   Alcohol abuse Father    Heart disease Father    Heart disease Brother        back problems   Hypertension Brother    Alcohol abuse Brother    Varicose Veins Daughter    Kidney failure Maternal Grandmother    Stroke Maternal Grandfather    Cancer Paternal Grandmother        mouth - snuff   Heart attack Paternal Grandfather        stroke, HTN   Cancer Brother    Hyperlipidemia Brother    Heart attack Brother    Cancer Brother    Miscarriages / Stillbirths Sister    Coronary artery disease Neg Hx    Social History   Socioeconomic History   Marital status: Married    Spouse name: Not on file   Number of children: Not on file   Years of education: Not on file   Highest education level: GED or equivalent  Occupational History   Not on file  Tobacco Use   Smoking status: Former    Packs/day: 1.00    Years: 35.00    Additional pack years: 0.00    Total pack years: 35.00    Types: Cigarettes    Quit date: 07/22/2014    Years since quitting: 8.7   Smokeless tobacco: Never   Tobacco comments:    down to ~2 cigarettes/daily (06/23/14) - Quit Smoking around 07/20/14!  Vaping Use   Vaping Use: Never used  Substance and Sexual Activity   Alcohol use: No   Drug use: No   Sexual activity: Yes    Partners: Male    Birth control/protection: Surgical, Other-see comments    Comment: Hysterectomy  Other Topics Concern   Not on file  Social History Narrative   Not on file   Social Determinants of Health   Financial Resource Strain: Low Risk  (04/17/2023)   Overall Financial Resource Strain (CARDIA)    Difficulty of Paying Living Expenses: Not hard at all  Food Insecurity:  No Food Insecurity (04/17/2023)   Hunger Vital Sign    Worried About Running Out of Food in  the Last Year: Never true    Ran Out of Food in the Last Year: Never true  Transportation Needs: No Transportation Needs (04/17/2023)   PRAPARE - Administrator, Civil Service (Medical): No    Lack of Transportation (Non-Medical): No  Physical Activity: Insufficiently Active (04/17/2023)   Exercise Vital Sign    Days of Exercise per Week: 2 days    Minutes of Exercise per Session: 20 min  Stress: No Stress Concern Present (04/17/2023)   [employer] of Occupational Health - Occupational Stress Questionnaire    Feeling of Stress : Not at all  Social Connections: Moderately Integrated (04/17/2023)   Social Connection and Isolation Panel [NHANES]    Frequency of Communication with Friends and Family: More than three times a week    Frequency of Social Gatherings with Friends and Family: Once a week    Attends Religious Services: 1 to 4 times per year    Active Member of [employer] or Organizations: No    Attends Banker Meetings: Never    Marital Status: Married    Tobacco Counseling Counseling given: Not Answered Tobacco comments: down to ~2 cigarettes/daily (06/23/14) - Quit Smoking around 07/20/14!   Clinical Intake:  Pre-visit preparation completed: Yes  Pain : No/denies pain     BMI - recorded: 35.96 Nutritional Status: BMI > 30  Obese Nutritional Risks: None Diabetes: No  How often do you need to have someone help you when you read instructions, pamphlets, or other written materials from your doctor or pharmacy?: 1 - Never  Diabetic?no  Interpreter Needed?: No  Information entered by :: Lanier Ensign, LPN   Activities of Daily Living    04/17/2023    8:13 AM 11/09/2022   10:40 AM  In your present state of health, do you have any difficulty performing the following activities:  Hearing? 1 0  Comment left ear   Vision? 0 0  Difficulty concentrating or making decisions? 0 0  Walking or climbing stairs? 0 1  Dressing or bathing? 0 0  Doing  errands, shopping? 0 1  Preparing Food and eating ? N N  Using the Toilet? N N  In the past six months, have you accidently leaked urine? N Y  Do you have problems with loss of bowel control? N N  Managing your Medications? N N  Managing your Finances? N N  Housekeeping or managing your Housekeeping? N Y    Patient Care Team: Jeoffrey Massed, MD as PCP - General Hilty, Lisette Abu, MD as PCP - Cardiology (Cardiology) Rennis Golden Lisette Abu, MD as Consulting Physician (Cardiology) Derian, Gabriela Eves, MD (Orthopedic Surgery) Sheral Apley, MD as Consulting Physician (Orthopedic Surgery) Casimer Lanius, MD as Consulting Physician (Rheumatology) Lanelle Bal, DO as Consulting Physician (Internal Medicine)  Indicate any recent Medical Services you may have received from other than [facility] providers in the past year (date may be approximate).     Assessment:   This is a routine wellness examination for Samanthamarie.  Hearing/Vision screen Hearing Screening - Comments:: Pt stated left ear HOH  Vision Screening - Comments:: Pt follows up with [facility] for annual eye exams   Dietary issues and exercise activities discussed: Current Exercise Habits: The patient does not participate in regular exercise at present   Goals Addressed  This Visit's Progress    Patient Stated       Get around better        Depression Screen    04/17/2023    8:11 AM 01/31/2023    1:57 PM 10/18/2022    1:27 PM 04/21/2022    9:12 AM 01/17/2022    2:17 PM 10/19/2021    2:06 PM 08/10/2020    1:36 PM  PHQ 2/9 Scores  PHQ - 2 Score 0 1 1 0 2 0 0  PHQ- 9 Score  3         Fall Risk    04/17/2023    8:13 AM 01/31/2023    1:57 PM 11/09/2022   10:40 AM 10/18/2022    1:27 PM 04/21/2022    9:15 AM  Fall Risk   Falls in the past year? 0 1 0 1 0  Number falls in past yr: 0 0 0 1 0  Injury with Fall? 0 0 1 0 0  Risk for fall due to : Impaired vision;Impaired balance/gait   History of fall(s)    Follow up Falls prevention discussed   Falls evaluation completed Falls evaluation completed    FALL RISK PREVENTION PERTAINING TO THE HOME:  Any stairs in or around the home? Yes  If so, are there any without handrails? No  Home free of loose throw rugs in walkways, pet beds, electrical cords, etc? Yes  Adequate lighting in your home to reduce risk of falls? Yes   ASSISTIVE DEVICES UTILIZED TO PREVENT FALLS:  Life alert? No  Use of a cane, walker or w/c? Yes  Grab bars in the bathroom? Yes  Shower chair or bench in shower? Yes  Elevated toilet seat or a handicapped toilet? Yes   TIMED UP AND GO:  Was the test performed? No .   Cognitive Function:        04/17/2023    8:15 AM 04/21/2022    9:24 AM  6CIT Screen  What Year? 0 points 0 points  What month? 0 points 0 points  What time? 0 points 0 points  Count back from 20 0 points 0 points  Months in reverse 0 points 0 points  Repeat phrase 0 points 0 points  Total Score 0 points 0 points    Immunizations Immunization History  Administered Date(s) Administered   Fluad Quad(high Dose 65+) 07/19/2022   Influenza Split 10/25/2011   Influenza, Quadrivalent, Recombinant, Inj, Pf 09/06/2020   Influenza, Seasonal, Injecte, Preservative Fre 10/30/2012   Influenza,inj,Quad PF,6+ Mos 11/12/2013, 07/14/2014, 07/14/2015, 09/13/2016, 09/05/2017, 08/21/2018, 09/24/2019   Influenza-Unspecified 03/19/2021   PNEUMOCOCCAL CONJUGATE-20 04/19/2021   Pneumococcal Polysaccharide-23 02/21/2012, 06/06/2017   Tdap 10/25/2011    TDAP status: Due, Education has been provided regarding the importance of this vaccine. Advised may receive this vaccine at local pharmacy or Health Dept. Aware to provide a copy of the vaccination record if obtained from local pharmacy or Health Dept. Verbalized acceptance and understanding.  Flu Vaccine status: Up to date  Pneumococcal vaccine status: Up to date  Covid-19 vaccine status: Declined, Education  has been provided regarding the importance of this vaccine but patient still declined. Advised may receive this vaccine at local pharmacy or Health Dept.or vaccine clinic. Aware to provide a copy of the vaccination record if obtained from local pharmacy or Health Dept. Verbalized acceptance and understanding.  Qualifies for Shingles Vaccine? Yes   Zostavax completed No   Shingrix Completed?: No.    Education has been provided  regarding the importance of this vaccine. Patient has been advised to call insurance company to determine out of pocket expense if they have not yet received this vaccine. Advised may also receive vaccine at local pharmacy or Health Dept. Verbalized acceptance and understanding.  Screening Tests Health Maintenance  Topic Date Due   COVID-19 Vaccine (1) Never done   Zoster Vaccines- Shingrix (1 of 2) Never done   Lung Cancer Screening  05/09/2015   DTaP/Tdap/Td (2 - Td or Tdap) 10/24/2021   MAMMOGRAM  04/20/2023 (Originally 06/08/2006)   INFLUENZA VACCINE  06/06/2023   Medicare Annual Wellness (AWV)  04/16/2024   Colonoscopy  06/12/2024   Pneumonia Vaccine 30+ Years old  Completed   DEXA SCAN  Completed   Hepatitis C Screening  Completed   HPV VACCINES  Aged Out    Health Maintenance  Health Maintenance Due  Topic Date Due   COVID-19 Vaccine (1) Never done   Zoster Vaccines- Shingrix (1 of 2) Never done   Lung Cancer Screening  05/09/2015   DTaP/Tdap/Td (2 - Td or Tdap) 10/24/2021    Colorectal cancer screening: Type of screening: Colonoscopy. Completed 06/12/21. Repeat every 3 years  Mammogram status: No longer required due to per pt .  Bone scan 10/05/21  Lung Cancer Screening: (Low Dose CT Chest recommended if Age 36-80 years, 30 pack-year currently smoking OR have quit w/in 15years.) does qualify.   Lung Cancer Screening Referral: declined at this time   Additional Screening:  Hepatitis C Screening: Completed 01/05/21  Vision Screening: Recommended  annual ophthalmology exams for early detection of glaucoma and other disorders of the eye. Is the patient up to date with their annual eye exam?  Yes  Who is the provider or what is the name of the office in which the patient attends annual eye exams? [facility] If pt is not established with a provider, would they like to be referred to a provider to establish care? No .   Dental Screening: Recommended annual dental exams for proper oral hygiene  Community Resource Referral / Chronic Care Management: CRR required this visit?  No   CCM required this visit?  No      Plan:     I have personally reviewed and noted the following in the patient's chart:   Medical and social history Use of alcohol, tobacco or illicit drugs  Current medications and supplements including opioid prescriptions. Patient is currently taking opioid prescriptions. Information provided to patient regarding non-opioid alternatives. Patient advised to discuss non-opioid treatment plan with their provider. Functional ability and status Nutritional status Physical activity Advanced directives List of other physicians Hospitalizations, surgeries, and ER visits in previous 12 months Vitals Screenings to include cognitive, depression, and falls Referrals and appointments  In addition, I have reviewed and discussed with patient certain preventive protocols, quality metrics, and best practice recommendations. A written personalized care plan for preventive services as well as general preventive health recommendations were provided to patient.     Marzella Schlein, LPN   [id number]/[id number]   Nurse Notes: none

## 2023-05-02 ENCOUNTER — Ambulatory Visit (INDEPENDENT_AMBULATORY_CARE_PROVIDER_SITE_OTHER): Payer: Medicare PPO | Admitting: Family Medicine

## 2023-05-02 ENCOUNTER — Encounter: Payer: Self-pay | Admitting: Family Medicine

## 2023-05-02 VITALS — BP 121/76 | HR 87 | Wt 205.0 lb

## 2023-05-02 DIAGNOSIS — Z0000 Encounter for general adult medical examination without abnormal findings: Secondary | ICD-10-CM

## 2023-05-02 DIAGNOSIS — M1712 Unilateral primary osteoarthritis, left knee: Secondary | ICD-10-CM

## 2023-05-02 DIAGNOSIS — E782 Mixed hyperlipidemia: Secondary | ICD-10-CM | POA: Diagnosis not present

## 2023-05-02 DIAGNOSIS — M25562 Pain in left knee: Secondary | ICD-10-CM | POA: Diagnosis not present

## 2023-05-02 DIAGNOSIS — M159 Polyosteoarthritis, unspecified: Secondary | ICD-10-CM | POA: Diagnosis not present

## 2023-05-02 DIAGNOSIS — R7303 Prediabetes: Secondary | ICD-10-CM | POA: Diagnosis not present

## 2023-05-02 DIAGNOSIS — Z79899 Other long term (current) drug therapy: Secondary | ICD-10-CM | POA: Diagnosis not present

## 2023-05-02 DIAGNOSIS — G894 Chronic pain syndrome: Secondary | ICD-10-CM

## 2023-05-02 DIAGNOSIS — R7401 Elevation of levels of liver transaminase levels: Secondary | ICD-10-CM

## 2023-05-02 DIAGNOSIS — F411 Generalized anxiety disorder: Secondary | ICD-10-CM | POA: Diagnosis not present

## 2023-05-02 DIAGNOSIS — G8929 Other chronic pain: Secondary | ICD-10-CM

## 2023-05-02 MED ORDER — CYCLOBENZAPRINE HCL 10 MG PO TABS: 10.0000 mg | ORAL_TABLET | Freq: Three times a day (TID) | ORAL | 1 refills | Status: DC | PRN

## 2023-05-02 MED ORDER — LORAZEPAM 1 MG PO TABS: ORAL_TABLET | ORAL | 5 refills | Status: DC

## 2023-05-02 MED ORDER — ROSUVASTATIN CALCIUM 40 MG PO TABS: ORAL_TABLET | ORAL | 1 refills | Status: DC

## 2023-05-02 MED ORDER — OXYCODONE HCL 5 MG PO TABS: ORAL_TABLET | ORAL | 0 refills | Status: DC

## 2023-05-02 MED ORDER — PANTOPRAZOLE SODIUM 40 MG PO TBEC: DELAYED_RELEASE_TABLET | ORAL | 1 refills | Status: DC

## 2023-05-02 MED ORDER — FENOFIBRATE 160 MG PO TABS: 160.0000 mg | ORAL_TABLET | Freq: Every day | ORAL | 1 refills | Status: DC

## 2023-05-02 NOTE — Progress Notes (Signed)
Office Note 05/02/2023  CC:  Chief Complaint  Patient presents with   Back Pain   Knee Pain   Patient is a 67 y.o. female who is here for annual health maintenance exam and 52-month follow-up chronic pain syndrome and chronic anxiety--> high risk medication use. A/P as of last visit: "#1 chronic pain syndrome: Chronic bilateral low back pain without sciatica, bilateral knee osteoarthritis, and chronic shoulder pain.  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 Recent rheumatology chemistry panel showed stable mild elevation of AST and ALT, otherwise normal. Will update controlled substance contract and urine drug screen at next follow-up visit in 3 months. Continue 1-2 5 mg oxycodone 3 times daily. No new prescription was needed today.   #2 prediabetes. Her last 2 hemoglobin A1c's over the last 6 months have been stable at 5.8 to 5.9%.   Plan next A1c 3 months.  Next lipids 3 months."  INTERIM HX: Celeste says her left knee is progressively worsening.  Steroid knee injections no longer help her. She requests referral to Dr. Margarita Rana.  He did her partial right knee replacement back in 2019.  Overall her pain is worse, mainly from the standpoint of her knees.  It does continue to progressively limit her functioning.  However, she is not in favor of changing her pain medication regimen. Indication for chronic opioid: chronic bilat LBP w/out sciatica, and chronic pain from bilat osteoarthritis knees.  Also LBP and bilat shoulder pain.  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  She remains on Humira and is doing well on this.  She still takes 1-2 oxycodone 3 times a day and this sufficiently helps control her pain to allow for maximum functioning and quality of life.  NSAID GI intolerance. Medication and dose: oxycodone 5mg , 1-2 tid prn. PMP AWARE reviewed today: most recent rx for oxycodone 5 mg was filled 04/02/2023, # 180, rx by me. Most recent  lorazepam 1 mg prescription filled 04/02/2023, #60, prescription by me. No red flags.  Past Medical History:  Diagnosis Date   Allergy    Anxiety    Aortic valve stenosis    "very mild"   Atypical chest pain 2007; 2015   cardiolyte neg, echo nl, cath showed mild/nonobstructive LAD disease   Bilateral primary osteoarthritis of knee 06/30/2012   Right >L.  Right unicompartmental knee arthroplasty 10/25/18   CAD (coronary artery disease)    Chronic LBP    Multiple surgeries   COPD (chronic obstructive pulmonary disease) (HCC)    COVID-19 virus infection 06/02/2019   Eval at [facility] ED and d/c'd home.  Admitted 06/12/19 with hypoxic RF and bilat infiltrates.   DDD (degenerative disc disease)    spinal stenosis   Elevated liver enzymes    2021 after starting methotrexate. 2022->rheum d/c'd her methotrexate   Her abd u/s 12/05/20 showed fatty liver, o/w normal.   Fatty liver    11/2020 liver u/s   Gastritis    H pylori NEG on EGD 02/2021   GERD (gastroesophageal reflux disease)    History of adenomatous polyp of colon 06/2021   2022, recall 5 yrs   History of GI bleed    NSAIDS   Hyperlipidemia    mixed   Microscopic hematuria    full w/ u unrevealing X 2   Normal nuclear stress test 11/11 and 06/2014   negative, EF normal   Oxygen deficiency    Just At night   Palpitations 2006   48H holter neg  Positive occult stool blood test    iFOB positive 04/2021->colonoscopy showed multiple adenomatous polyps and nonbleeding int hem.   Prediabetes    Highest A1c 6.1% as of 03/2017   RBBB (right bundle branch block)    Recurrent UTI    Seronegative rheumatoid arthritis (HCC) 10/2019   multiple sites->pred started, methotrex started 10/2019; rheum to add humira as of 03/2020   Tobacco dependence in remission    Quit fall 2015.      Past Surgical History:  Procedure Laterality Date   ABDOMINAL HYSTERECTOMY  1997   DUB   APPENDECTOMY  1984   BIOPSY  02/14/2021   Procedure: BIOPSY;   Surgeon: Lanelle Bal, DO;  Location: [facility];  Service: Endoscopy;;   CARDIAC CATHETERIZATION  10/09/2005   no CAD, mildly elevated LVEDP, normal LV function (Dr. Laurell Josephs)   CARDIAC CATHETERIZATION N/A 05/16/2015   Mild non-obstructive CAD--med mgmt.  Procedure: Left Heart Cath and Coronary Angiography;  Surgeon: Chrystie Nose, MD;  Location: [facility] INVASIVE CV LAB;  Service: Cardiovascular;  Laterality: N/A;   CARDIOVASCULAR STRESS TEST  06/2014   normal lexiscan NST   CARDIOVASCULAR STRESS TEST  2006   persantine - no ischemia, low risk    COLONOSCOPY W/ POLYPECTOMY  06/12/2021   multiple adenomas--recall 3-5 yrs   COLONOSCOPY WITH PROPOFOL N/A 06/12/2021   Procedure: COLONOSCOPY WITH PROPOFOL;  Surgeon: Lanelle Bal, DO;  Location: [facility];  Service: Endoscopy;  Laterality: N/A;  8:30am   ESOPHAGOGASTRODUODENOSCOPY  02/14/2021   Gastritis ( H pylori NEG) w/out bleeding   ESOPHAGOGASTRODUODENOSCOPY (EGD) WITH PROPOFOL N/A 02/14/2021   Procedure: ESOPHAGOGASTRODUODENOSCOPY (EGD) WITH PROPOFOL;  Surgeon: Lanelle Bal, DO;  Location: [facility];  Service: Endoscopy;  Laterality: N/A;  AM   JOINT REPLACEMENT  11-05-2017   Partial right knee replacement   KNEE ARTHROSCOPY Bilateral    left wrist ganglion cyst excision  2010   LUMBAR SPINE SURGERY  1993   right iliac crest bone graft+metal instrumentation; 2005 metal removal and decompression, 2011 lumbar decompression 4-11, then stabilization/ instrumentation done 09-19-10: L2,L3, L5 left and L2 , L3, L4 right pedical remnant L4 left embedded. Left iliac crest bone graft-- Dr Lily Lovings SPINE SURGERY  02/10/2016   Dr. Clotilde Dieter: lumbar decompression, instrumentation removal, and fusion exploration--HELPED her a lot, esp her radicular leg pains.   OOPHORECTOMY Right    cyst   PARTIAL KNEE ARTHROPLASTY Right 11/04/2018   Procedure: UNICOMPARTMENTAL KNEE;  Surgeon: Sheral Apley, MD;  Location: [facility];   Service: Orthopedics;  Laterality: Right;  Adductor Block   POLYPECTOMY  06/12/2021   Procedure: POLYPECTOMY;  Surgeon: Lanelle Bal, DO;  Location: [facility];  Service: Endoscopy;;   SPINE SURGERY  05-24-1991   Dr Clotilde Dieter surgeon [facility] [place]. 2 level spinal fusion. 09-22-1998, 06-21-2004 took plates and screws out also got bone off of nerves. 2012 took out plates and screws and got bones off of nerves. 2017 2 level spinal fusion and scraped bones away from nerves.   TONSILLECTOMY  67 yrs old   TRANSTHORACIC ECHOCARDIOGRAM  2006   EF=>55%, trace MR, mild TR, trace AV regurg, trace pulm valve regurg    TUBAL LIGATION      Family History  Problem Relation Age of Onset   Heart Problems Mother        and thyroid problems   Hyperlipidemia Mother    Diabetes Mother    Breast cancer Mother  Arthritis Mother    Heart failure Father        CHF, heart attack, diabetes, hyperlipidemia   Alcohol abuse Father    Heart disease Father    Heart disease Brother        back problems   Hypertension Brother    Alcohol abuse Brother    Varicose Veins Daughter    Kidney failure Maternal Grandmother    Stroke Maternal Grandfather    Cancer Paternal Grandmother        mouth - snuff   Heart attack Paternal Grandfather        stroke, HTN   Cancer Brother    Hyperlipidemia Brother    Heart attack Brother    Cancer Brother    Miscarriages / Stillbirths Sister    Coronary artery disease Neg Hx     Social History   Socioeconomic History   Marital status: Married    Spouse name: Not on file   Number of children: Not on file   Years of education: Not on file   Highest education level: GED or equivalent  Occupational History   Not on file  Tobacco Use   Smoking status: Former    Packs/day: 1.00    Years: 35.00    Additional pack years: 0.00    Total pack years: 35.00    Types: Cigarettes    Quit date: 07/22/2014    Years since quitting: 8.7   Smokeless tobacco: Never   Tobacco  comments:    down to ~2 cigarettes/daily (06/23/14) - Quit Smoking around 07/20/14!  Vaping Use   Vaping Use: Never used  Substance and Sexual Activity   Alcohol use: No   Drug use: No   Sexual activity: Yes    Partners: Male    Birth control/protection: Surgical, Other-see comments    Comment: Hysterectomy  Other Topics Concern   Not on file  Social History Narrative   Not on file   Social Determinants of Health   Financial Resource Strain: Low Risk  (05/01/2023)   Overall Financial Resource Strain (CARDIA)    Difficulty of Paying Living Expenses: Not very hard  Food Insecurity: No Food Insecurity (05/01/2023)   Hunger Vital Sign    Worried About Running Out of Food in the Last Year: Never true    Ran Out of Food in the Last Year: Never true  Transportation Needs: No Transportation Needs (05/01/2023)   PRAPARE - Administrator, Civil Service (Medical): No    Lack of Transportation (Non-Medical): No  Physical Activity: Unknown (05/01/2023)   Exercise Vital Sign    Days of Exercise per Week: Patient declined    Minutes of Exercise per Session: 20 min  Recent Concern: Physical Activity - Insufficiently Active (04/17/2023)   Exercise Vital Sign    Days of Exercise per Week: 2 days    Minutes of Exercise per Session: 20 min  Stress: No Stress Concern Present (05/01/2023)   [employer] of Occupational Health - Occupational Stress Questionnaire    Feeling of Stress : Only a little  Social Connections: Socially Integrated (05/01/2023)   Social Connection and Isolation Panel [NHANES]    Frequency of Communication with Friends and Family: More than three times a week    Frequency of Social Gatherings with Friends and Family: Twice a week    Attends Religious Services: More than 4 times per year    Active Member of [employer] or Organizations: Yes    Attends Banker  Meetings: More than 4 times per year    Marital Status: Married  Catering manager Violence: Not At  Risk (04/17/2023)   Humiliation, Afraid, Rape, and Kick questionnaire    Fear of Current or Ex-Partner: No    Emotionally Abused: No    Physically Abused: No    Sexually Abused: No    Outpatient Medications Prior to Visit  Medication Sig Dispense Refill   acetaminophen (TYLENOL) 500 MG tablet Take 500 mg by mouth every 6 (six) hours as needed for fever.     Calcium Carb-Cholecalciferol (CALCIUM/VITAMIN D PO) Take 1 tablet by mouth daily.     clopidogrel (PLAVIX) 75 MG tablet TAKE 1 TABLET(75 MG) BY MOUTH DAILY 90 tablet 1   folic acid (FOLVITE) 1 MG tablet Take 1 mg by mouth daily.      lisinopril (ZESTRIL) 5 MG tablet TAKE 1 TABLET(5 MG) BY MOUTH DAILY 90 tablet 0   OXYGEN Inhale into the lungs. 2.5 L     Tiotropium Bromide-Olodaterol (STIOLTO RESPIMAT) 2.5-2.5 MCG/ACT AERS Inhale 2 puffs into the lungs daily. 1 each 11   LORazepam (ATIVAN) 1 MG tablet TAKE 1 TABLET(1 MG) BY MOUTH TWICE DAILY 60 tablet 5   Adalimumab (HUMIRA) 40 MG/0.8ML PSKT Inject 40 mg into the skin every 14 (fourteen) days.     albuterol (VENTOLIN HFA) 108 (90 Base) MCG/ACT inhaler Inhale 2 puffs into the lungs every 4 (four) hours as needed for wheezing or shortness of breath (bronchitis). Reported on 03/12/2016 (Patient not taking: Reported on 05/02/2023) 18 g 1   nitroGLYCERIN (NITROSTAT) 0.4 MG SL tablet Place 1 tablet (0.4 mg total) under the tongue every 5 (five) minutes as needed for chest pain (MAX 3 doses.). (Patient not taking: Reported on 05/02/2023) 10 tablet 1   cyclobenzaprine (FLEXERIL) 10 MG tablet Take 1 tablet (10 mg total) by mouth every 8 (eight) hours as needed for muscle spasms. OFFICE VISIT NEEDED FOR FURTHER REFILLS 30 tablet 0   fenofibrate 160 MG tablet Take 1 tablet (160 mg total) by mouth daily. with food 90 tablet 1   oxyCODONE (OXY IR/ROXICODONE) 5 MG immediate release tablet 1-2 po tid pain 180 tablet 0   pantoprazole (PROTONIX) 40 MG tablet TAKE 1 TABLET(40 MG) BY MOUTH DAILY 90 tablet 1    rosuvastatin (CRESTOR) 40 MG tablet TAKE 1 TABLET(40 MG) BY MOUTH DAILY 90 tablet 1   No facility-administered medications prior to visit.    Allergies  Allergen Reactions   Penicillins Anaphylaxis and Swelling    DID THE REACTION INVOLVE: Swelling of the face/tongue/throat, SOB, or low BP? Yes Sudden or severe rash/hives, skin peeling, or the inside of the mouth or nose? Yes Did it require medical treatment? Yes When did it last happen? 67 years old If all above answers are "NO", may proceed with cephalosporin use.    Aspirin Other (See Comments)     GI Bleed   Beta Adrenergic Blockers Other (See Comments)    REACTION: decreased libido   Citalopram Nausea And Vomiting   Nsaids Other (See Comments)     GI Upset   Other Hives and Itching    Walnuts    Plaquenil [Hydroxychloroquine Sulfate] Other (See Comments)    Tachycardia   Prochlorperazine Edisylate Other (See Comments)     Stroke like symptoms   Sulfonamide Derivatives Other (See Comments)    Unknown allergic reaction   Amitriptyline Hcl Palpitations and Other (See Comments)     increased heart rate  Review of Systems  Constitutional:  Negative for appetite change, chills, fatigue and fever.  HENT:  Negative for congestion, dental problem, ear pain and sore throat.   Eyes:  Negative for discharge, redness and visual disturbance.  Respiratory:  Negative for cough, chest tightness, shortness of breath and wheezing.   Cardiovascular:  Negative for chest pain, palpitations and leg swelling.  Gastrointestinal:  Negative for abdominal pain, blood in stool, diarrhea, nausea and vomiting.  Genitourinary:  Negative for difficulty urinating, dysuria, flank pain, frequency, hematuria and urgency.  Musculoskeletal:  Positive for arthralgias (chronic hands, shoulders, knees, back) and back pain (chronic). Negative for joint swelling, myalgias and neck stiffness.  Skin:  Negative for pallor and rash.  Neurological:  Negative for  dizziness, speech difficulty, weakness and headaches.  Hematological:  Negative for adenopathy. Does not bruise/bleed easily.  Psychiatric/Behavioral:  Negative for confusion and sleep disturbance. The patient is not nervous/anxious.     PE;    05/02/2023    2:04 PM 04/17/2023    8:05 AM 03/18/2023    1:58 PM  Vitals with BMI  Height   5\' 3"   Weight 205 lbs 203 lbs 212 lbs  BMI   37.56  Systolic 121 125 829  Diastolic 76 77 70  Pulse 87 77 89    Exam chaperoned by Cloe Motsinger, CMA Gen: Alert, well appearing.  Patient is oriented to person, place, time, and situation. AFFECT: pleasant, lucid thought and speech. ENT: Ears: EACs clear, normal epithelium.  TMs with good light reflex and landmarks bilaterally.  Eyes: no injection, icteris, swelling, or exudate.  EOMI, PERRLA. Nose: no drainage or turbinate edema/swelling.  No injection or focal lesion.  Mouth: lips without lesion/swelling.  Oral mucosa pink and moist.  Dentition intact and without obvious caries or gingival swelling.  Oropharynx without erythema, exudate, or swelling.  Neck: supple/nontender.  No LAD, mass, or TM.  Carotid pulses 2+ bilaterally, without bruits. CV: RRR, no m/r/g.   LUNGS: CTA bilat, nonlabored resps, good aeration in all lung fields. ABD: soft, NT, ND, BS normal.  No hepatospenomegaly or mass.  No bruits. EXT: no clubbing, cyanosis, or edema.  Musculoskeletal: No joint erythema, warmth, or soft tissue swelling.  Skin - no sores or suspicious lesions or rashes or color changes  Pertinent labs:  Lab Results  Component Value Date   TSH 0.42 09/24/2019   Lab Results  Component Value Date   WBC 11.0 (H) 04/19/2022   HGB 13.6 05/21/2022   HCT 40 05/21/2022   MCV 94.5 04/19/2022   PLT 163 05/21/2022   Lab Results  Component Value Date   CREATININE 0.8 01/21/2023   BUN 15 01/21/2023   NA 142 01/21/2023   K 4.2 01/21/2023   CL 103 01/21/2023   CO2 29 (A) 01/21/2023   Lab Results  Component  Value Date   ALT 44 (A) 01/21/2023   AST 54 (A) 01/21/2023   ALKPHOS 87 01/21/2023   BILITOT 0.9 10/18/2022   Lab Results  Component Value Date   CHOL 98 10/18/2022   Lab Results  Component Value Date   HDL 41.80 10/18/2022   Lab Results  Component Value Date   LDLCALC 36 10/18/2022   Lab Results  Component Value Date   TRIG 99.0 10/18/2022   Lab Results  Component Value Date   CHOLHDL 2 10/18/2022   Lab Results  Component Value Date   HGBA1C 5.8 (A) 10/18/2022   HGBA1C 5.8 10/18/2022   HGBA1C 5.8  10/18/2022   HGBA1C 5.8 10/18/2022   ASSESSMENT AND PLAN:   #1 health maintenance exam: Reviewed age and gender appropriate health maintenance issues (prudent diet, regular exercise, health risks of tobacco and excessive alcohol, use of seatbelts, fire alarms in home, use of sunscreen).  Also reviewed age and gender appropriate health screening as well as vaccine recommendations. Vaccines: Shingrix-->rx to pharm.  Tdap-->rx to pharm.   O/w UTD. Labs: cbc,cmet, hba1c, lipid panel Cervical ca screening:  no longer needed (hx of hysterectomy for benign dx) Breast ca screening: due for mammogram but she declines. Colon ca screening: recall 2025-2027 timeframe. Osteoporosis: DEXA 03/2021 via Rheum MD-->osteopenia.  Continue calcium and vitamin D. Rheum plans DEXA at their office at her next f/u visit.  #2 chronic pain syndrome: Chronic bilateral low back pain without sciatica, bilateral knee osteoarthritis, and chronic shoulder pain.  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 Continue 1-2 5 mg oxycodone 3 times daily. CSC updated.  #3 GAD. Continue lorazepam 1 mg twice daily. Prescription for #60, refill x 5 today. Controlled substance contract updated.  #4 prediabetes. Hemoglobin A1c today.  5.  Elevated transaminases, mild, chronic. NASH.  These have been stable over time. Monitor today.  An After Visit Summary was printed and given to the  patient.  FOLLOW UP:  Return in about 3 months (around 08/02/2023) for routine chronic illness f/u.  Signed:  Santiago Bumpers, MD           05/02/2023

## 2023-05-03 ENCOUNTER — Other Ambulatory Visit: Payer: Self-pay | Admitting: Family Medicine

## 2023-05-03 LAB — COMPREHENSIVE METABOLIC PANEL
ALT: 30 U/L (ref 0–35)
AST: 48 U/L — ABNORMAL HIGH (ref 0–37)
Albumin: 4.3 g/dL (ref 3.5–5.2)
Alkaline Phosphatase: 85 U/L (ref 39–117)
BUN: 10 mg/dL (ref 6–23)
CO2: 31 mEq/L (ref 19–32)
Calcium: 10.5 mg/dL (ref 8.4–10.5)
Chloride: 103 mEq/L (ref 96–112)
Creatinine, Ser: 0.73 mg/dL (ref 0.40–1.20)
GFR: 85.41 mL/min (ref >60.00)
Glucose, Bld: 133 mg/dL — ABNORMAL HIGH (ref 70–99)
Potassium: 4.1 mEq/L (ref 3.5–5.1)
Sodium: 141 mEq/L (ref 135–145)
Total Bilirubin: 1.2 mg/dL (ref 0.2–1.2)
Total Protein: 7.2 g/dL (ref 6.0–8.3)

## 2023-05-03 LAB — CBC
HCT: 42.7 % (ref 36.0–46.0)
Hemoglobin: 13.9 g/dL (ref 12.0–15.0)
MCHC: 32.6 g/dL (ref 30.0–36.0)
MCV: 94 fl (ref 78.0–100.0)
Platelets: 121 10*3/uL — ABNORMAL LOW (ref 150.0–400.0)
RBC: 4.54 Mil/uL (ref 3.87–5.11)
RDW: 14.3 % (ref 11.5–15.5)
WBC: 9.3 10*3/uL (ref 4.0–10.5)

## 2023-05-03 LAB — HEMOGLOBIN A1C: Hgb A1c MFr Bld: 7.2 % — ABNORMAL HIGH (ref 4.6–6.5)

## 2023-05-03 LAB — LIPID PANEL
Cholesterol: 104 mg/dL (ref 0–200)
HDL: 38.9 mg/dL — ABNORMAL LOW (ref >39.00)
LDL Cholesterol: 44 mg/dL (ref 0–99)
NonHDL: 65.55
Total CHOL/HDL Ratio: 3
Triglycerides: 108 mg/dL (ref 0.0–149.0)
VLDL: 21.6 mg/dL (ref 0.0–40.0)

## 2023-05-03 MED ORDER — METFORMIN HCL 500 MG PO TABS: 500.0000 mg | ORAL_TABLET | Freq: Two times a day (BID) | ORAL | 3 refills | Status: DC

## 2023-05-13 ENCOUNTER — Telehealth: Payer: Self-pay

## 2023-05-13 DIAGNOSIS — M25562 Pain in left knee: Secondary | ICD-10-CM | POA: Diagnosis not present

## 2023-05-13 NOTE — Telephone Encounter (Signed)
Surgical clearance forms received on 05/13/23. Patient has been scheduled on n/a, appt 05/02/23 appt. Forms have been placed on PCP desk to review and sign, if appropriate.

## 2023-05-14 ENCOUNTER — Telehealth: Payer: Self-pay

## 2023-05-14 ENCOUNTER — Telehealth: Payer: Self-pay | Admitting: Pulmonary Disease

## 2023-05-14 NOTE — Telephone Encounter (Signed)
Preoperative evaluation: Pulmonary medicine does not provide preoperative clearance rather preoperative risk stratification.  Based on the ARISCAT model, if duration of surgery is less than 2 hours the patient is low 1.6% risk of postoperative pulmonary complication.  If duration of procedure is greater than 2 hours the patient is intermediate or 13.3% risk of postoperative pulmonary complication.  There are no modifiable risk factors to address prior to surgery.  See recommendations below. --Please provide DuoNebs in the preoperative area --Please provide DuoNebs in the PACU --Avoid prolonged neuromuscular blockade

## 2023-05-14 NOTE — Telephone Encounter (Signed)
Fax received from Dr. Eulah Pont with [facility] to perform a Left Total Knee Replacement on patient.  Patient needs surgery clearance. Surgery is Pending. Patient was seen on 03/18/23. Office protocol is a risk assessment can be sent to surgeon if patient has been seen in 60 days or less.   Sending to Dr. Judeth Horn for risk assessment or recommendations if patient needs to be seen in office prior to surgical procedure.

## 2023-05-14 NOTE — Telephone Encounter (Signed)
   Pre-operative Risk Assessment    Patient Name: Brenda [name]  DOB: December 27, 1955 MRN: [id number]      Request for Surgical Clearance    Procedure:   LT TOTAL KNEE REPLACEMENT   Date of Surgery:  Clearance TBD                                 Surgeon:  DR. Margarita Rana Surgeon's Group or Practice Name:  [facility]  Phone number:  [phone] Fax number:  [phone]   Type of Clearance Requested:   - Pharmacy:  Hold Clopidogrel (Plavix) NEEDS INSTRUCTIONS WHEN TO HOLD   Type of Anesthesia:  Spinal   Additional requests/questions:    SignedMichaelle Copas   05/14/2023, 3:49 PM

## 2023-05-14 NOTE — Telephone Encounter (Signed)
Pt calling to inform us on her knee surgery

## 2023-05-15 DIAGNOSIS — R092 Respiratory arrest: Secondary | ICD-10-CM | POA: Diagnosis not present

## 2023-05-15 DIAGNOSIS — J441 Chronic obstructive pulmonary disease with (acute) exacerbation: Secondary | ICD-10-CM | POA: Diagnosis not present

## 2023-05-15 NOTE — Telephone Encounter (Signed)
   Name: Brenda [name]  DOB: 1956-01-04  MRN: [id number]  Primary Cardiologist: Chrystie Nose, MD  Chart reviewed as part of pre-operative protocol coverage. Because of Brenda [name]'s past medical history and time since last visit, she will require a follow-up in-office visit in order to better assess preoperative cardiovascular risk.  Pre-op covering staff: - Please schedule appointment and call patient to inform them. If patient already had an upcoming appointment within acceptable timeframe, please add "pre-op clearance" to the appointment notes so provider is aware. - Please contact requesting surgeon's office via preferred method (i.e, phone, fax) to inform them of need for appointment prior to surgery.  Patient has not been seen since 2019.  Plavix hold parameters will be addressed during in person appointment.    Sharlene Dory, PA-C  05/15/2023, 7:59 AM

## 2023-05-15 NOTE — Telephone Encounter (Signed)
1st attempt to reach pt due to surgical clearance and the need for an IN-OFFICE appointment.  Left  pt a message to call back and get that scheduled.

## 2023-05-15 NOTE — Telephone Encounter (Signed)
OV notes and clearance form have been faxed back to [facility]. Nothing further needed at this time.  

## 2023-05-16 NOTE — Telephone Encounter (Signed)
Signed and put in box to go up front. Signed:  Santiago Bumpers, MD           05/16/2023

## 2023-05-16 NOTE — Telephone Encounter (Signed)
Dr. Greig Right surgery scheduler will need to send over a new pt referral as pt last seen 2019 by cardiology.

## 2023-05-16 NOTE — Telephone Encounter (Signed)
I s/w the pt and she has been scheduled as a new pt with DR. Arida @ 10 am 05/28/23, for pre op clearance. Pt was last seen by cardiology 2019. Pt thanked me for the help.   I will update all parties involved.

## 2023-05-23 DIAGNOSIS — M810 Age-related osteoporosis without current pathological fracture: Secondary | ICD-10-CM | POA: Diagnosis not present

## 2023-05-23 DIAGNOSIS — M1990 Unspecified osteoarthritis, unspecified site: Secondary | ICD-10-CM | POA: Diagnosis not present

## 2023-05-23 DIAGNOSIS — Z79899 Other long term (current) drug therapy: Secondary | ICD-10-CM | POA: Diagnosis not present

## 2023-05-23 DIAGNOSIS — M0609 Rheumatoid arthritis without rheumatoid factor, multiple sites: Secondary | ICD-10-CM | POA: Diagnosis not present

## 2023-05-23 DIAGNOSIS — R748 Abnormal levels of other serum enzymes: Secondary | ICD-10-CM | POA: Diagnosis not present

## 2023-05-23 DIAGNOSIS — E669 Obesity, unspecified: Secondary | ICD-10-CM | POA: Diagnosis not present

## 2023-05-23 DIAGNOSIS — M549 Dorsalgia, unspecified: Secondary | ICD-10-CM | POA: Diagnosis not present

## 2023-05-23 DIAGNOSIS — M79643 Pain in unspecified hand: Secondary | ICD-10-CM | POA: Diagnosis not present

## 2023-05-23 DIAGNOSIS — M8580 Other specified disorders of bone density and structure, unspecified site: Secondary | ICD-10-CM | POA: Diagnosis not present

## 2023-05-23 DIAGNOSIS — I2510 Atherosclerotic heart disease of native coronary artery without angina pectoris: Secondary | ICD-10-CM | POA: Diagnosis not present

## 2023-05-23 LAB — BASIC METABOLIC PANEL
BUN: 10 (ref 4–21)
CO2: 24 — AB (ref 13–22)
Chloride: 106 (ref 99–108)
Creatinine: 0.7 (ref 0.5–1.1)
Glucose: 112
Potassium: 4.6 meq/L (ref 3.5–5.1)
Sodium: 145 (ref 137–147)

## 2023-05-23 LAB — HEPATIC FUNCTION PANEL
ALT: 32 U/L (ref 7–35)
AST: 48 — AB (ref 13–35)
Alkaline Phosphatase: 87 (ref 25–125)
Bilirubin, Total: 0.8

## 2023-05-23 LAB — CBC AND DIFFERENTIAL
HCT: 43 (ref 36–46)
Hemoglobin: 14.2 (ref 12.0–16.0)
Neutrophils Absolute: 4.5
Platelets: 138 10*3/uL — AB (ref 150–400)
WBC: 8

## 2023-05-23 LAB — COMPREHENSIVE METABOLIC PANEL
Albumin: 4.6 (ref 3.5–5.0)
Calcium: 10.6 (ref 8.7–10.7)
Globulin: 2.7

## 2023-05-23 LAB — CBC: RBC: 4.62 (ref 3.87–5.11)

## 2023-05-23 LAB — POCT ERYTHROCYTE SEDIMENTATION RATE, NON-AUTOMATED: Sed Rate: 4

## 2023-05-28 ENCOUNTER — Ambulatory Visit: Payer: Medicare PPO | Admitting: Cardiovascular Disease

## 2023-05-30 ENCOUNTER — Other Ambulatory Visit: Payer: Self-pay | Admitting: Family Medicine

## 2023-05-31 MED ORDER — OXYCODONE HCL 5 MG PO TABS: ORAL_TABLET | ORAL | 0 refills | Status: DC

## 2023-06-13 ENCOUNTER — Other Ambulatory Visit: Payer: Self-pay | Admitting: Family Medicine

## 2023-06-15 DIAGNOSIS — R092 Respiratory arrest: Secondary | ICD-10-CM | POA: Diagnosis not present

## 2023-06-15 DIAGNOSIS — J441 Chronic obstructive pulmonary disease with (acute) exacerbation: Secondary | ICD-10-CM | POA: Diagnosis not present

## 2023-06-19 ENCOUNTER — Encounter (HOSPITAL_BASED_OUTPATIENT_CLINIC_OR_DEPARTMENT_OTHER): Payer: Self-pay | Admitting: Cardiology

## 2023-06-19 ENCOUNTER — Ambulatory Visit (HOSPITAL_BASED_OUTPATIENT_CLINIC_OR_DEPARTMENT_OTHER): Payer: Medicare PPO | Admitting: Cardiology

## 2023-06-19 ENCOUNTER — Encounter (HOSPITAL_BASED_OUTPATIENT_CLINIC_OR_DEPARTMENT_OTHER): Payer: Self-pay

## 2023-06-19 VITALS — BP 126/74 | HR 80 | Ht 63.0 in | Wt 204.0 lb

## 2023-06-19 DIAGNOSIS — Z01810 Encounter for preprocedural cardiovascular examination: Secondary | ICD-10-CM | POA: Diagnosis not present

## 2023-06-19 DIAGNOSIS — I2583 Coronary atherosclerosis due to lipid rich plaque: Secondary | ICD-10-CM | POA: Diagnosis not present

## 2023-06-19 DIAGNOSIS — E7849 Other hyperlipidemia: Secondary | ICD-10-CM | POA: Diagnosis not present

## 2023-06-19 DIAGNOSIS — I2510 Atherosclerotic heart disease of native coronary artery without angina pectoris: Secondary | ICD-10-CM

## 2023-06-19 NOTE — Patient Instructions (Signed)
Medication Instructions:  Your physician recommends that you continue on your current medications as directed. Please refer to the Current Medication list given to you today.  *If you need a refill on your cardiac medications before your next appointment, please call your pharmacy*  Testing/Procedures: Your physician has requested that you have an echocardiogram. Echocardiography is a painless test that uses sound waves to create images of your heart. It provides your doctor with information about the size and shape of your heart and how well your heart's chambers and valves are working. This procedure takes approximately one hour. There are no restrictions for this procedure. Please do NOT wear cologne, perfume, aftershave, or lotions (deodorant is allowed). Please arrive 15 minutes prior to your appointment time.  Your physician has requested that you have a lexiscan myoview. For further information please visit [email or web address] Please follow instruction sheet, as given.  Follow-Up: At [facility], you and your health needs are our priority.  As part of our continuing mission to provide you with exceptional heart care, we have created designated Provider Care Teams.  These Care Teams include your primary Cardiologist (physician) and Advanced Practice Providers (APPs -  Physician Assistants and Nurse Practitioners) who all work together to provide you with the care you need, when you need it.  Your next appointment:   1 year  Provider:   Donato Schultz, MD

## 2023-06-19 NOTE — Progress Notes (Signed)
Cardiology Office Note:    Date:  06/19/2023   ID:  Brenda [name], DOB 10/18/1956, MRN [id number]  PCP:  Jeoffrey Massed, MD   [facility] Providers Cardiologist:  Donato Schultz, MD     Referring MD: Jeoffrey Massed, MD    History of Present Illness:    Brenda [name] Discussed the use of AI scribe software for clinical note transcription with the patient, who gave verbal consent to proceed.  History of Present Illness   A 67 year old patient with a history of nonobstructive coronary artery disease, right bundle branch block, hyperlipidemia, and diabetes presents for preoperative cardiac risk evaluation. The patient's coronary artery disease was identified via cardiac catheterization in 2016, revealing a 40% proximal to mid LAD lesion and a 30% distal LAD lesion. The patient is currently on statin therapy for hyperlipidemia and Plavix 75 mg for coronary artery disease.  The patient has a history of partial knee replacement and is currently awaiting surgery for severe left knee osteoarthritis, having failed conservative management strategies. The patient's diabetes is managed by Dr. Milinda Cave, with prior hemoglobin A1cs of 5.8 and 5.9 but now 7.2. The patient has reported GI intolerance to NSAIDs.  An echocardiogram in 2019 showed a normal ejection fraction of 60% with a possible bicuspid aortic valve and very mild aortic valve stenosis. The patient's labs from 2024 were largely normal, with a potassium of 4.1, creatinine of 0.73, ALT of 30, BNP of 38, LDL of 44, CRP of less than 1, and hemoglobin of 13.9.  The patient denies any chest pain, unusual shortness of breath, fainting spells, or bleeding problems. The patient has been diagnosed with diabetes and is currently on metformin. The patient also takes fenofibrate 160 mg and Crestor 40 mg daily for hyperlipidemia. The patient denies any history of heart attack or stroke.       Past Medical History:  Diagnosis Date    Allergy    Anxiety    Aortic valve stenosis    "very mild"   Atypical chest pain 2007; 2015   cardiolyte neg, echo nl, cath showed mild/nonobstructive LAD disease   Bilateral primary osteoarthritis of knee 06/30/2012   Right >L.  Right unicompartmental knee arthroplasty 10/25/18   CAD (coronary artery disease)    Chronic LBP    Multiple surgeries   COPD (chronic obstructive pulmonary disease) (HCC)    COVID-19 virus infection 06/02/2019   Eval at [facility] ED and d/c'd home.  Admitted 06/12/19 with hypoxic RF and bilat infiltrates.   DDD (degenerative disc disease)    spinal stenosis   Elevated liver enzymes    2021 after starting methotrexate. 2022->rheum d/c'd her methotrexate   Her abd u/s 12/05/20 showed fatty liver, o/w normal.   Fatty liver    11/2020 liver u/s   Gastritis    H pylori NEG on EGD 02/2021   GERD (gastroesophageal reflux disease)    History of adenomatous polyp of colon 06/2021   2022, recall 5 yrs   History of GI bleed    NSAIDS   Hyperlipidemia    mixed   Microscopic hematuria    full w/ u unrevealing X 2   Normal nuclear stress test 11/11 and 06/2014   negative, EF normal   Oxygen deficiency    Just At night   Palpitations 2006   48H holter neg   Positive occult stool blood test    iFOB positive 04/2021->colonoscopy showed multiple adenomatous polyps and nonbleeding int hem.  Prediabetes    Highest A1c 6.1% as of 03/2017   RBBB (right bundle branch block)    Recurrent UTI    Seronegative rheumatoid arthritis (HCC) 10/2019   multiple sites->pred started, methotrex started 10/2019; rheum to add humira as of 03/2020   Tobacco dependence in remission    Quit fall 2015.      Past Surgical History:  Procedure Laterality Date   ABDOMINAL HYSTERECTOMY  1997   DUB   APPENDECTOMY  1984   BIOPSY  02/14/2021   Procedure: BIOPSY;  Surgeon: Lanelle Bal, DO;  Location: [facility];  Service: Endoscopy;;   CARDIAC CATHETERIZATION  10/09/2005   no CAD, mildly  elevated LVEDP, normal LV function (Dr. Laurell Josephs)   CARDIAC CATHETERIZATION N/A 05/16/2015   Mild non-obstructive CAD--med mgmt.  Procedure: Left Heart Cath and Coronary Angiography;  Surgeon: Chrystie Nose, MD;  Location: [facility] INVASIVE CV LAB;  Service: Cardiovascular;  Laterality: N/A;   CARDIOVASCULAR STRESS TEST  06/2014   normal lexiscan NST   CARDIOVASCULAR STRESS TEST  2006   persantine - no ischemia, low risk    COLONOSCOPY W/ POLYPECTOMY  06/12/2021   multiple adenomas--recall 3-5 yrs   COLONOSCOPY WITH PROPOFOL N/A 06/12/2021   Procedure: COLONOSCOPY WITH PROPOFOL;  Surgeon: Lanelle Bal, DO;  Location: [facility];  Service: Endoscopy;  Laterality: N/A;  8:30am   ESOPHAGOGASTRODUODENOSCOPY  02/14/2021   Gastritis ( H pylori NEG) w/out bleeding   ESOPHAGOGASTRODUODENOSCOPY (EGD) WITH PROPOFOL N/A 02/14/2021   Procedure: ESOPHAGOGASTRODUODENOSCOPY (EGD) WITH PROPOFOL;  Surgeon: Lanelle Bal, DO;  [facility]: [facility];  Service: Endoscopy;  Laterality: N/A;  AM   JOINT REPLACEMENT  11-05-2017   Partial right knee replacement   KNEE ARTHROSCOPY Bilateral    left wrist ganglion cyst excision  2010   LUMBAR SPINE SURGERY  1993   right iliac crest bone graft+metal instrumentation; 2005 metal removal and decompression, 2011 lumbar decompression 4-11, then stabilization/ instrumentation done 09-19-10: L2,L3, L5 left and L2 , L3, L4 right pedical remnant L4 left embedded. Left iliac crest bone graft-- Dr Lily Lovings SPINE SURGERY  02/10/2016   Dr. Clotilde Dieter: lumbar decompression, instrumentation removal, and fusion exploration--HELPED her a lot, esp her radicular leg pains.   OOPHORECTOMY Right    cyst   PARTIAL KNEE ARTHROPLASTY Right 11/04/2018   Procedure: UNICOMPARTMENTAL KNEE;  Surgeon: Sheral Apley, MD;  Location: [facility];  Service: Orthopedics;  Laterality: Right;  Adductor Block   POLYPECTOMY  06/12/2021   Procedure: POLYPECTOMY;  Surgeon: Lanelle Bal,  DO;  Location: [facility];  Service: Endoscopy;;   SPINE SURGERY  05-24-1991   Dr Clotilde Dieter surgeon [facility]. 2 level spinal fusion. 09-22-1998, 06-21-2004 took plates and screws out also got bone off of nerves. 2012 took out plates and screws and got bones off of nerves. 2017 2 level spinal fusion and scraped bones away from nerves.   TONSILLECTOMY  67 yrs old   TRANSTHORACIC ECHOCARDIOGRAM  2006   EF=>55%, trace MR, mild TR, trace AV regurg, trace pulm valve regurg    TUBAL LIGATION      Current Medications: Current Meds  Medication Sig   acetaminophen (TYLENOL) 500 MG tablet Take 500 mg by mouth every 6 (six) hours as needed for fever.   Adalimumab (HUMIRA) 40 MG/0.8ML PSKT Inject 40 mg into the skin every 14 (fourteen) days.   albuterol (VENTOLIN HFA) 108 (90 Base) MCG/ACT inhaler Inhale 2 puffs into the  lungs every 4 (four) hours as needed for wheezing or shortness of breath (bronchitis). Reported on 03/12/2016   Calcium Carb-Cholecalciferol (CALCIUM/VITAMIN D PO) Take 1 tablet by mouth daily.   clopidogrel (PLAVIX) 75 MG tablet TAKE 1 TABLET(75 MG) BY MOUTH DAILY   cyclobenzaprine (FLEXERIL) 10 MG tablet TAKE 1 TABLET(10 MG) BY MOUTH EVERY 8 HOURS AS NEEDED FOR MUSCLE SPASMS   fenofibrate 160 MG tablet Take 1 tablet (160 mg total) by mouth daily. with food   folic acid (FOLVITE) 1 MG tablet Take 1 mg by mouth daily.    lisinopril (ZESTRIL) 5 MG tablet TAKE 1 TABLET(5 MG) BY MOUTH DAILY   LORazepam (ATIVAN) 1 MG tablet TAKE 1 TABLET(1 MG) BY MOUTH TWICE DAILY   metFORMIN (GLUCOPHAGE) 500 MG tablet Take 1 tablet (500 mg total) by mouth 2 (two) times daily with a meal.   nitroGLYCERIN (NITROSTAT) 0.4 MG SL tablet Place 1 tablet (0.4 mg total) under the tongue every 5 (five) minutes as needed for chest pain (MAX 3 doses.).   oxyCODONE (OXY IR/ROXICODONE) 5 MG immediate release tablet 1-2 po tid pain   OXYGEN Inhale into the lungs. 2.5 L   pantoprazole (PROTONIX) 40 MG tablet TAKE 1 TABLET(40  MG) BY MOUTH DAILY   rosuvastatin (CRESTOR) 40 MG tablet TAKE 1 TABLET(40 MG) BY MOUTH DAILY   Tiotropium Bromide-Olodaterol (STIOLTO RESPIMAT) 2.5-2.5 MCG/ACT AERS Inhale 2 puffs into the lungs daily.     Allergies:   Penicillins, Aspirin, Beta adrenergic blockers, Citalopram, Nsaids, Other, Plaquenil [hydroxychloroquine sulfate], Prochlorperazine edisylate, Sulfonamide derivatives, and Amitriptyline hcl   Social History   Socioeconomic History   Marital status: Married    Spouse name: Not on file   Number of children: Not on file   Years of education: Not on file   Highest education level: GED or equivalent  Occupational History   Not on file  Tobacco Use   Smoking status: Former    Current packs/day: 0.00    Average packs/day: 1 pack/day for 35.0 years (35.0 ttl pk-yrs)    Types: Cigarettes    Start date: 07/23/1979    Quit date: 07/22/2014    Years since quitting: 8.9   Smokeless tobacco: Never   Tobacco comments:    down to ~2 cigarettes/daily (06/23/14) - Quit Smoking around 07/20/14!  Vaping Use   Vaping status: Never Used  Substance and Sexual Activity   Alcohol use: No   Drug use: No   Sexual activity: Yes    Partners: Male    Birth control/protection: Surgical, Other-see comments    Comment: Hysterectomy  Other Topics Concern   Not on file  Social History Narrative   Not on file   Social Determinants of Health   Financial Resource Strain: Low Risk  (05/01/2023)   Overall Financial Resource Strain (CARDIA)    Difficulty of Paying Living Expenses: Not very hard  Food Insecurity: No Food Insecurity (05/01/2023)   Hunger Vital Sign    Worried About Running Out of Food in the Last Year: Never true    Ran Out of Food in the Last Year: Never true  Transportation Needs: No Transportation Needs (05/01/2023)   PRAPARE - Administrator, Civil Service (Medical): No    Lack of Transportation (Non-Medical): No  Physical Activity: Unknown (05/01/2023)   Exercise  Vital Sign    Days of Exercise per Week: Patient declined    Minutes of Exercise per Session: 20 min  Recent Concern: Physical Activity - Insufficiently  Active (04/17/2023)   Exercise Vital Sign    Days of Exercise per Week: 2 days    Minutes of Exercise per Session: 20 min  Stress: No Stress Concern Present (05/01/2023)   [employer] of Occupational Health - Occupational Stress Questionnaire    Feeling of Stress : Only a little  Social Connections: Socially Integrated (05/01/2023)   Social Connection and Isolation Panel [NHANES]    Frequency of Communication with Friends and Family: More than three times a week    Frequency of Social Gatherings with Friends and Family: Twice a week    Attends Religious Services: More than 4 times per year    Active Member of [employer] or Organizations: Yes    Attends Engineer, structural: More than 4 times per year    Marital Status: Married     Family History: The patient's family history includes Alcohol abuse in her brother and father; Arthritis in her mother; Breast cancer in her mother; Cancer in her brother, brother, and paternal grandmother; Diabetes in her mother; Heart Problems in her mother; Heart attack in her brother and paternal grandfather; Heart disease in her brother and father; Heart failure in her father; Hyperlipidemia in her brother and mother; Hypertension in her brother; Kidney failure in her maternal grandmother; Miscarriages / [place] in her sister; Stroke in her maternal grandfather; Varicose Veins in her daughter. There is no history of Coronary artery disease.  ROS:   Please see the history of present illness.     All other systems reviewed and are negative.  EKGs/Labs/Other Studies Reviewed:      EKG:  The ekg ordered today demonstrates EKG Interpretation Date/Time:  Wednesday June 19 2023 08:48:34 EDT Ventricular Rate:  80 PR Interval:  166 QRS Duration:  130 QT Interval:  412 QTC Calculation: 475 R  Axis:   15  Text Interpretation: Normal sinus rhythm Right bundle branch block When compared with ECG of 10-Feb-2021 09:32, No significant change since last tracing Confirmed by Donato Schultz ([id number]) on 06/19/2023 8:51:45 AM    Recent Labs: 05/02/2023: ALT 30; BUN 10; Creatinine, Ser 0.73; Hemoglobin 13.9; Platelets 121.0; Potassium 4.1; Sodium 141  Recent Lipid Panel    Component Value Date/Time   CHOL 104 05/02/2023 1443   TRIG 108.0 05/02/2023 1443   HDL 38.90 (L) 05/02/2023 1443   CHOLHDL 3 05/02/2023 1443   VLDL 21.6 05/02/2023 1443   LDLCALC 44 05/02/2023 1443   LDLCALC 48 01/18/2022 1539   LDLDIRECT 172.2 07/14/2014 1437   LABS Potassium: 4.1 (2024) Creatinine: 0.73 (2024) ALT: 30 (2024) BNP: 38 (2024) LDL: 44 (2024) CRP: <1 (2024) Hemoglobin: 13.9 (2024) Hemoglobin A1c: 7.2 (05/02/2023) Triglycerides: 108 (2024)  DIAGNOSTIC Cardiac catheterization: Proximal to mid LAD lesion of 40% and distal LAD lesion of 30%, mild nonobstructive disease (05/16/2015) Echocardiogram: Normal ejection fraction of 60%, possible bicuspid aortic valve with very mild aortic valve stenosis, mean gradient of 10 mmHg (09/23/2018)  Risk Assessment/Calculations:               Physical Exam:    VS:  BP 126/74   Pulse 80   Ht 5\' 3"  (1.6 m)   Wt 204 lb (92.5 kg)   BMI 36.14 kg/m     Wt Readings from Last 3 Encounters:  06/19/23 204 lb (92.5 kg)  05/02/23 205 lb (93 kg)  04/17/23 203 lb (92.1 kg)     CHEST: Lungs clear to auscultation. CARDIOVASCULAR: Heart rhythm regular, presence of a 1/6 systolic murmur.  EXTREMITIES: Pulses palpable in arms. NEUROLOGIC:  Alert and oriented x 3 PSYCHIATRIC:  Normal affect   ASSESSMENT:    1. Pre-operative cardiovascular examination   2. Coronary artery disease due to lipid rich plaque   3. Other hyperlipidemia    PLAN:    In order of problems listed above:  Assessment and Plan    Preoperative Cardiac Risk Evaluation 67 year old  female with history of nonobstructive coronary artery disease, right bundle branch block, and mild aortic stenosis. No current symptoms of chest pain, shortness of breath, or fainting spells. -Order echocardiogram to assess for progression of aortic stenosis. -Order stress test to assess for high-risk changes in coronary arteries. -Continue current medications including Plavix 75mg , Crestor 40mg , and Lisinopril 5mg . -Hold Plavix for 5 days prior to surgery.  Hyperlipidemia Well controlled on Fenofibrate 160mg  and Crestor 40mg  daily. LDL at goal. -Continue current medications.  Type 2 diabetes Recent Hemoglobin A1c of 7.2, indicating progression to diabetes. Currently on Metformin. -Continue Metformin and follow up with primary care physician.  Follow-up in 1 year.            Informed Consent   Shared Decision Making/Informed Consent The risks [chest pain, shortness of breath, cardiac arrhythmias, dizziness, blood pressure fluctuations, myocardial infarction, stroke/transient ischemic attack, nausea, vomiting, allergic reaction, radiation exposure, metallic taste sensation and life-threatening complications (estimated to be 1 in 10,000)], benefits (risk stratification, diagnosing coronary artery disease, treatment guidance) and alternatives of a nuclear stress test were discussed in detail with Ms. Arment and she agrees to proceed.       Medication Adjustments/Labs and Tests Ordered: Current medicines are reviewed at length with the patient today.  Concerns regarding medicines are outlined above.  Orders Placed This Encounter  Procedures   MYOCARDIAL PERFUSION IMAGING   EKG 12-Lead   ECHOCARDIOGRAM COMPLETE   No orders of the defined types were placed in this encounter.   Patient Instructions  Medication Instructions:  Your physician recommends that you continue on your current medications as directed. Please refer to the Current Medication list given to you today.  *If you  need a refill on your cardiac medications before your next appointment, please call your pharmacy*  Testing/Procedures: Your physician has requested that you have an echocardiogram. Echocardiography is a painless test that uses sound waves to create images of your heart. It provides your doctor with information about the size and shape of your heart and how well your heart's chambers and valves are working. This procedure takes approximately one hour. There are no restrictions for this procedure. Please do NOT wear cologne, perfume, aftershave, or lotions (deodorant is allowed). Please arrive 15 minutes prior to your appointment time.  Your physician has requested that you have a lexiscan myoview. For further information please visit [email or web address] Please follow instruction sheet, as given.  Follow-Up: At [facility], you and your health needs are our priority.  As part of our continuing mission to provide you with exceptional heart care, we have created designated Provider Care Teams.  These Care Teams include your primary Cardiologist (physician) and Advanced Practice Providers (APPs -  Physician Assistants and Nurse Practitioners) who all work together to provide you with the care you need, when you need it.  Your next appointment:   1 year  Provider:   Donato Schultz, MD     Signed, Donato Schultz, MD  06/19/2023 9:25 AM     [facility]

## 2023-06-20 ENCOUNTER — Encounter (INDEPENDENT_AMBULATORY_CARE_PROVIDER_SITE_OTHER): Payer: Self-pay

## 2023-07-01 ENCOUNTER — Other Ambulatory Visit: Payer: Self-pay | Admitting: Family Medicine

## 2023-07-01 MED ORDER — OXYCODONE HCL 5 MG PO TABS: ORAL_TABLET | ORAL | 0 refills | Status: DC

## 2023-07-01 MED ORDER — CYCLOBENZAPRINE HCL 10 MG PO TABS: 10.0000 mg | ORAL_TABLET | Freq: Three times a day (TID) | ORAL | 5 refills | Status: DC | PRN

## 2023-07-05 ENCOUNTER — Telehealth (HOSPITAL_COMMUNITY): Payer: Self-pay | Admitting: *Deleted

## 2023-07-05 NOTE — Telephone Encounter (Signed)
Patient given detailed instructions per Myocardial Perfusion Study Information Sheet for the test on 07/10/2023 at 10:45. Patient notified to arrive 15 minutes early and that it is imperative to arrive on time for appointment to keep from having the test rescheduled.  If you need to cancel or reschedule your appointment, please call the office within 24 hours of your appointment. . Patient verbalized understanding.Daneil Dolin

## 2023-07-10 ENCOUNTER — Ambulatory Visit (HOSPITAL_COMMUNITY): Payer: Medicare PPO | Attending: Internal Medicine

## 2023-07-10 ENCOUNTER — Ambulatory Visit (HOSPITAL_BASED_OUTPATIENT_CLINIC_OR_DEPARTMENT_OTHER): Payer: Medicare PPO

## 2023-07-10 DIAGNOSIS — Z01810 Encounter for preprocedural cardiovascular examination: Secondary | ICD-10-CM

## 2023-07-10 DIAGNOSIS — E7849 Other hyperlipidemia: Secondary | ICD-10-CM

## 2023-07-10 DIAGNOSIS — I2510 Atherosclerotic heart disease of native coronary artery without angina pectoris: Secondary | ICD-10-CM

## 2023-07-10 DIAGNOSIS — I2583 Coronary atherosclerosis due to lipid rich plaque: Secondary | ICD-10-CM

## 2023-07-10 LAB — MYOCARDIAL PERFUSION IMAGING
LV dias vol: 64 mL (ref 46–106)
LV sys vol: 22 mL
Nuc Stress EF: 66 %
Peak HR: 89 {beats}/min
Rest HR: 76 {beats}/min
Rest Nuclear Isotope Dose: 11 mCi
SDS: 0
SRS: 0
SSS: 0
ST Depression (mm): 0 mm
Stress Nuclear Isotope Dose: 32.9 mCi
TID: 1.03

## 2023-07-10 LAB — ECHOCARDIOGRAM COMPLETE
AR max vel: 1.46 cm2
AV Area VTI: 1.63 cm2
AV Area mean vel: 1.45 cm2
AV Mean grad: 15.7 mmHg
AV Peak grad: 30.4 mmHg
Ao pk vel: 2.76 m/s
Area-P 1/2: 2.75 cm2
S' Lateral: 2.3 cm

## 2023-07-10 MED ORDER — TECHNETIUM TC 99M TETROFOSMIN IV KIT
32.9000 | PACK | Freq: Once | INTRAVENOUS | Status: AC | PRN
  Administered 2023-07-10: 32.9 via INTRAVENOUS

## 2023-07-10 MED ORDER — REGADENOSON 0.4 MG/5ML IV SOLN
0.4000 mg | Freq: Once | INTRAVENOUS | Status: AC
  Administered 2023-07-10: 0.4 mg via INTRAVENOUS

## 2023-07-10 MED ORDER — TECHNETIUM TC 99M TETROFOSMIN IV KIT
11.0000 | PACK | Freq: Once | INTRAVENOUS | Status: AC | PRN
  Administered 2023-07-10: 11 via INTRAVENOUS

## 2023-07-16 DIAGNOSIS — J441 Chronic obstructive pulmonary disease with (acute) exacerbation: Secondary | ICD-10-CM | POA: Diagnosis not present

## 2023-07-16 DIAGNOSIS — R092 Respiratory arrest: Secondary | ICD-10-CM | POA: Diagnosis not present

## 2023-07-17 ENCOUNTER — Other Ambulatory Visit: Payer: Self-pay | Admitting: Family Medicine

## 2023-07-22 ENCOUNTER — Telehealth (HOSPITAL_BASED_OUTPATIENT_CLINIC_OR_DEPARTMENT_OTHER): Payer: Self-pay | Admitting: *Deleted

## 2023-07-22 ENCOUNTER — Telehealth: Payer: Self-pay | Admitting: Cardiology

## 2023-07-22 NOTE — Telephone Encounter (Signed)
Pre-operative Risk Assessment    Patient Name: Brenda [name]  DOB: 02-05-1956 MRN: [id number]        Request for Surgical Clearance     Procedure:   LT TOTAL KNEE REPLACEMENT    Date of Surgery:  Clearance TBD                                 Surgeon:  DR. Margarita Rana Surgeon's Group or Practice Name:  [facility]  Phone number:  [phone] Fax number:  [phone]   Type of Clearance Requested:   - Pharmacy:  Hold Clopidogrel (Plavix) NEEDS INSTRUCTIONS WHEN TO HOLD   Type of Anesthesia:  Spinal   Additional requests/questions:     SignedMichaelle Copas   05/14/2023, 3:49 PM         Jake Bathe, MD 07/11/2023  3:26 PM EDT     Normal stress test. May proceed with surgery   Please address when pt should hold Plavix prior to surgery.

## 2023-07-22 NOTE — Telephone Encounter (Signed)
Created phone note for surgical clearance - sent to preop pool.  Please see that phone note for further documentation.

## 2023-07-22 NOTE — Telephone Encounter (Signed)
Patient Name: Brenda [name]  DOB: 07/01/1956 MRN: [id number]  Primary Cardiologist: Donato Schultz, MD  Chart reviewed as part of pre-operative protocol coverage. Pre-op clearance already addressed by colleagues in earlier phone notes. To summarize recommendations:  Normal stress test. May proceed with surgery -Dr. Anne Fu -Okay to hold plavix 5 days and resume when medically safe to do so.  Will route this bundled recommendation to requesting provider via Epic fax function and remove from pre-op pool. Please call with questions.   Sharlene Dory, PA-C 07/22/2023, 12:19 PM

## 2023-07-22 NOTE — Telephone Encounter (Signed)
Patient states she was scheduled for knee surgery and Pam, RN wanted the name of the surgeon. Patient states the surgeon is Dr. Eulah Pont with [facility].

## 2023-08-01 ENCOUNTER — Ambulatory Visit: Payer: Medicare PPO | Admitting: Family Medicine

## 2023-08-01 ENCOUNTER — Encounter: Payer: Self-pay | Admitting: Family Medicine

## 2023-08-01 VITALS — BP 124/70 | HR 88 | Temp 98.0°F | Wt 206.6 lb

## 2023-08-01 DIAGNOSIS — M25562 Pain in left knee: Secondary | ICD-10-CM

## 2023-08-01 DIAGNOSIS — Z23 Encounter for immunization: Secondary | ICD-10-CM | POA: Diagnosis not present

## 2023-08-01 DIAGNOSIS — G8929 Other chronic pain: Secondary | ICD-10-CM | POA: Diagnosis not present

## 2023-08-01 DIAGNOSIS — Z79899 Other long term (current) drug therapy: Secondary | ICD-10-CM

## 2023-08-01 DIAGNOSIS — M25561 Pain in right knee: Secondary | ICD-10-CM

## 2023-08-01 DIAGNOSIS — Z7984 Long term (current) use of oral hypoglycemic drugs: Secondary | ICD-10-CM

## 2023-08-01 DIAGNOSIS — G894 Chronic pain syndrome: Secondary | ICD-10-CM

## 2023-08-01 DIAGNOSIS — E119 Type 2 diabetes mellitus without complications: Secondary | ICD-10-CM | POA: Diagnosis not present

## 2023-08-01 LAB — POCT GLYCOSYLATED HEMOGLOBIN (HGB A1C)
HbA1c POC (<> result, manual entry): 6.5 % (ref 4.0–5.6)
HbA1c, POC (controlled diabetic range): 6.5 % (ref 0.0–7.0)
HbA1c, POC (prediabetic range): 6.5 % — AB (ref 5.7–6.4)
Hemoglobin A1C: 6.5 % — AB (ref 4.0–5.6)

## 2023-08-01 LAB — BASIC METABOLIC PANEL
BUN: 16 mg/dL (ref 6–23)
CO2: 28 mEq/L (ref 19–32)
Calcium: 10.5 mg/dL (ref 8.4–10.5)
Chloride: 104 mEq/L (ref 96–112)
Creatinine, Ser: 0.76 mg/dL (ref 0.40–1.20)
GFR: 81.24 mL/min (ref >60.00)
Glucose, Bld: 121 mg/dL — ABNORMAL HIGH (ref 70–99)
Potassium: 4.5 mEq/L (ref 3.5–5.1)
Sodium: 141 mEq/L (ref 135–145)

## 2023-08-01 MED ORDER — METFORMIN HCL 500 MG PO TABS: 500.0000 mg | ORAL_TABLET | Freq: Two times a day (BID) | ORAL | 3 refills | Status: DC

## 2023-08-01 MED ORDER — CLOPIDOGREL BISULFATE 75 MG PO TABS: ORAL_TABLET | ORAL | 3 refills | Status: DC

## 2023-08-01 MED ORDER — LISINOPRIL 5 MG PO TABS: ORAL_TABLET | ORAL | 3 refills | Status: DC

## 2023-08-01 MED ORDER — PANTOPRAZOLE SODIUM 40 MG PO TBEC: DELAYED_RELEASE_TABLET | ORAL | 1 refills | Status: DC

## 2023-08-01 MED ORDER — LORAZEPAM 1 MG PO TABS: ORAL_TABLET | ORAL | 5 refills | Status: DC

## 2023-08-01 MED ORDER — OXYCODONE HCL 5 MG PO TABS: ORAL_TABLET | ORAL | 0 refills | Status: DC

## 2023-08-01 NOTE — Progress Notes (Signed)
OFFICE VISIT  08/01/2023  CC:  Chief Complaint  Patient presents with   Follow-up    3 month RCI and flu shot. No other questions or concerns    Patient is a 67 y.o. female who presents for 2-month follow-up chronic pain syndrome and diabetes.  INTERIM HX: Last visit her hemoglobin A1c was up to 7.2%. I started her on metformin 500 mg twice daily. Brenda [name] feels fine on the metformin.  Chronic pain is still severe in the knees, particularly the left.  She has plans for left total knee arthroplasty on 09/03/2023. Indication for chronic opioid: chronic bilat LBP w/out sciatica, and chronic pain from bilat osteoarthritis knees.  Also LBP and bilat shoulder pain.  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  She remains on Humira and is doing well on this.  She still takes 1-2 oxycodone 3 times a day and this sufficiently helps control her pain to allow for maximum functioning and quality of life.  NSAID GI intolerance.  PMP AWARE reviewed today: most recent rx for oxycodone was filled 07/02/2023, # 180, rx by me. Most recent lorazepam prescription filled 07/02/2023, #60, prescription by me. No red flags.  ROS as above, plus--> no fevers, no CP,  no dizziness, no HAs, no rashes, no melena/hematochezia.  No polyuria or polydipsia.  No myalgias or arthralgias.  No focal weakness, paresthesias, or tremors.  No acute vision or hearing abnormalities.  No dysuria or unusual/new urinary urgency or frequency.  No recent changes in lower legs. No n/v/d or abd pain.  No palpitations.    Past Medical History:  Diagnosis Date   Allergy    Anxiety    Aortic valve stenosis    "very mild"   Atypical chest pain 2007; 2015   cardiolyte neg, echo nl, cath showed mild/nonobstructive LAD disease   Bilateral primary osteoarthritis of knee 06/30/2012   Right >L.  Right unicompartmental knee arthroplasty 10/25/18   CAD (coronary artery disease)    Chronic LBP    Multiple surgeries   COPD  (chronic obstructive pulmonary disease) (HCC)    COVID-19 virus infection 06/02/2019   Eval at [facility] ED and d/c'd home.  Admitted 06/12/19 with hypoxic RF and bilat infiltrates.   DDD (degenerative disc disease)    spinal stenosis   Diabetes mellitus without complication (HCC)    04/2023 a1c 7.2%   Elevated liver enzymes    2021 after starting methotrexate. 2022->rheum d/c'd her methotrexate   Her abd u/s 12/05/20 showed fatty liver, o/w normal.   Fatty liver    11/2020 liver u/s   Gastritis    H pylori NEG on EGD 02/2021   GERD (gastroesophageal reflux disease)    History of adenomatous polyp of colon 06/2021   2022, recall 5 yrs   History of GI bleed    NSAIDS   Hyperlipidemia    mixed   Microscopic hematuria    full w/ u unrevealing X 2   Normal nuclear stress test 11/11 and 06/2014   negative, EF normal   Oxygen deficiency    Just At night   Palpitations 2006   48H holter neg   Positive occult stool blood test    iFOB positive 04/2021->colonoscopy showed multiple adenomatous polyps and nonbleeding int hem.   RBBB (right bundle branch block)    Recurrent UTI    Seronegative rheumatoid arthritis (HCC) 10/2019   multiple sites->pred started, methotrex started 10/2019; rheum to add humira as of 03/2020   Tobacco dependence in  remission    Quit fall 2015.      Past Surgical History:  Procedure Laterality Date   ABDOMINAL HYSTERECTOMY  1997   DUB   APPENDECTOMY  1984   BIOPSY  02/14/2021   Procedure: BIOPSY;  Surgeon: Lanelle Bal, DO;  Location: [facility];  Service: Endoscopy;;   CARDIAC CATHETERIZATION  10/09/2005   no CAD, mildly elevated LVEDP, normal LV function (Dr. Laurell Josephs)   CARDIAC CATHETERIZATION N/A 05/16/2015   Mild non-obstructive CAD--med mgmt.  Procedure: Left Heart Cath and Coronary Angiography;  Surgeon: Chrystie Nose, MD;  Location: [facility];  Service: Cardiovascular;  Laterality: N/A;   CARDIOVASCULAR STRESS TEST  06/2014   normal  lexiscan NST   CARDIOVASCULAR STRESS TEST  2006   persantine - no ischemia, low risk    COLONOSCOPY W/ POLYPECTOMY  06/12/2021   multiple adenomas--recall 3-5 yrs   COLONOSCOPY WITH PROPOFOL N/A 06/12/2021   Procedure: COLONOSCOPY WITH PROPOFOL;  Surgeon: Lanelle Bal, DO;  Location: [facility];  Service: Endoscopy;  Laterality: N/A;  8:30am   ESOPHAGOGASTRODUODENOSCOPY  02/14/2021   Gastritis ( H pylori NEG) w/out bleeding   ESOPHAGOGASTRODUODENOSCOPY (EGD) WITH PROPOFOL N/A 02/14/2021   Procedure: ESOPHAGOGASTRODUODENOSCOPY (EGD) WITH PROPOFOL;  Surgeon: Lanelle Bal, DO;  Location: [facility];  Service: Endoscopy;  Laterality: N/A;  AM   JOINT REPLACEMENT  11-05-2017   Partial right knee replacement   KNEE ARTHROSCOPY Bilateral    left wrist ganglion cyst excision  2010   LUMBAR SPINE SURGERY  1993   right iliac crest bone graft+metal instrumentation; 2005 metal removal and decompression, 2011 lumbar decompression 4-11, then stabilization/ instrumentation done 09-19-10: L2,L3, L5 left and L2 , L3, L4 right pedical remnant L4 left embedded. Left iliac crest bone graft-- Dr Lily Lovings SPINE SURGERY  02/10/2016   Dr. Clotilde Dieter: lumbar decompression, instrumentation removal, and fusion exploration--HELPED her a lot, esp her radicular leg pains.   OOPHORECTOMY Right    cyst   PARTIAL KNEE ARTHROPLASTY Right 11/04/2018   Procedure: UNICOMPARTMENTAL KNEE;  Surgeon: Sheral Apley, MD;  Location: [facility];  Service: Orthopedics;  Laterality: Right;  Adductor Block   POLYPECTOMY  06/12/2021   Procedure: POLYPECTOMY;  Surgeon: Lanelle Bal, DO;  Location: [facility];  Service: Endoscopy;;   SPINE SURGERY  05-24-1991   Dr Clotilde Dieter surgeon [facility] May. 2 level spinal fusion. 09-22-1998, 06-21-2004 took plates and screws out also got bone off of nerves. 2012 took out plates and screws and got bones off of nerves. 2017 2 level spinal fusion and scraped bones away from nerves.    TONSILLECTOMY  67 yrs old   TRANSTHORACIC ECHOCARDIOGRAM  2006   EF=>55%, trace MR, mild TR, trace AV regurg, trace pulm valve regurg    TUBAL LIGATION      Outpatient Medications Prior to Visit  Medication Sig Dispense Refill   acetaminophen (TYLENOL) 500 MG tablet Take 500 mg by mouth every 6 (six) hours as needed for fever.     Adalimumab (HUMIRA) 40 MG/0.8ML PSKT Inject 40 mg into the skin every 14 (fourteen) days.     albuterol (VENTOLIN HFA) 108 (90 Base) MCG/ACT inhaler Inhale 2 puffs into the lungs every 4 (four) hours as needed for wheezing or shortness of breath (bronchitis). Reported on 03/12/2016 18 g 1   Calcium Carb-Cholecalciferol (CALCIUM/VITAMIN D PO) Take 1 tablet by mouth daily.     cyclobenzaprine (FLEXERIL) 10 MG tablet Take  1 tablet (10 mg total) by mouth 3 (three) times daily as needed for muscle spasms. 30 tablet 5   fenofibrate 160 MG tablet Take 1 tablet (160 mg total) by mouth daily. with food 90 tablet 1   folic acid (FOLVITE) 1 MG tablet Take 1 mg by mouth daily.      nitroGLYCERIN (NITROSTAT) 0.4 MG SL tablet Place 1 tablet (0.4 mg total) under the tongue every 5 (five) minutes as needed for chest pain (MAX 3 doses.). 10 tablet 1   OXYGEN Inhale into the lungs. 2.5 L     rosuvastatin (CRESTOR) 40 MG tablet TAKE 1 TABLET(40 MG) BY MOUTH DAILY 90 tablet 1   Tiotropium Bromide-Olodaterol (STIOLTO RESPIMAT) 2.5-2.5 MCG/ACT AERS Inhale 2 puffs into the lungs daily. 1 each 11   LORazepam (ATIVAN) 1 MG tablet TAKE 1 TABLET(1 MG) BY MOUTH TWICE DAILY 60 tablet 5   oxyCODONE (OXY IR/ROXICODONE) 5 MG immediate release tablet 1-2 po tid pain 180 tablet 0   pantoprazole (PROTONIX) 40 MG tablet TAKE 1 TABLET(40 MG) BY MOUTH DAILY 90 tablet 1   clopidogrel (PLAVIX) 75 MG tablet TAKE 1 TABLET(75 MG) BY MOUTH DAILY 90 tablet 1   lisinopril (ZESTRIL) 5 MG tablet TAKE 1 TABLET(5 MG) BY MOUTH DAILY 90 tablet 0   metFORMIN (GLUCOPHAGE) 500 MG tablet Take 1 tablet (500 mg total) by  mouth 2 (two) times daily with a meal. 60 tablet 3   No facility-administered medications prior to visit.    Allergies  Allergen Reactions   Penicillins Anaphylaxis and Swelling    DID THE REACTION INVOLVE: Swelling of the face/tongue/throat, SOB, or low BP? Yes Sudden or severe rash/hives, skin peeling, or the inside of the mouth or nose? Yes Did it require medical treatment? Yes When did it last happen? 67 years old If all above answers are "NO", may proceed with cephalosporin use.    Aspirin Other (See Comments)     GI Bleed   Beta Adrenergic Blockers Other (See Comments)    REACTION: decreased libido   Citalopram Nausea And Vomiting   Nsaids Other (See Comments)     GI Upset   Other Hives and Itching    Walnuts    Plaquenil [Hydroxychloroquine Sulfate] Other (See Comments)    Tachycardia   Prochlorperazine Edisylate Other (See Comments)     Stroke like symptoms   Sulfonamide Derivatives Other (See Comments)    Unknown allergic reaction   Amitriptyline Hcl Palpitations and Other (See Comments)     increased heart rate    Review of Systems As per HPI  PE:    08/01/2023    1:50 PM 06/19/2023    8:46 AM 05/02/2023    2:04 PM  Vitals with BMI  Height  5\' 3"    Weight 206 lbs 10 oz 204 lbs 205 lbs  BMI  36.15   Systolic 124 126 962  Diastolic 70 74 76  Pulse 88 80 87     Physical Exam  Gen: Alert, well appearing.  Patient is oriented to person, place, time, and situation. AFFECT: pleasant, lucid thought and speech. No further exam today  LABS:  Last CBC Lab Results  Component Value Date   WBC 9.3 05/02/2023   HGB 13.9 05/02/2023   HCT 42.7 05/02/2023   MCV 94.0 05/02/2023   MCH 31.7 11/10/2021   RDW 14.3 05/02/2023   PLT 121.0 (L) 05/02/2023   Last metabolic panel Lab Results  Component Value Date   GLUCOSE 133 (  H) 05/02/2023   NA 141 05/02/2023   K 4.1 05/02/2023   CL 103 05/02/2023   CO2 31 05/02/2023   BUN 10 05/02/2023   CREATININE 0.73  05/02/2023   GFR 85.41 05/02/2023   CALCIUM 10.5 05/02/2023   PHOS 3.8 07/19/2022   PROT 7.2 05/02/2023   ALBUMIN 4.3 05/02/2023   LABGLOB 2.5 01/05/2021   AGRATIO 1.8 01/05/2021   BILITOT 1.2 05/02/2023   ALKPHOS 85 05/02/2023   AST 48 (H) 05/02/2023   ALT 30 05/02/2023   ANIONGAP 11 11/10/2021   Last lipids Lab Results  Component Value Date   CHOL 104 05/02/2023   HDL 38.90 (L) 05/02/2023   LDLCALC 44 05/02/2023   LDLDIRECT 172.2 07/14/2014   TRIG 108.0 05/02/2023   CHOLHDL 3 05/02/2023   Last hemoglobin A1c Lab Results  Component Value Date   HGBA1C 6.5 (A) 08/01/2023   HGBA1C 6.5 08/01/2023   HGBA1C 6.5 (A) 08/01/2023   HGBA1C 6.5 08/01/2023   Last thyroid functions Lab Results  Component Value Date   TSH 0.42 09/24/2019   IMPRESSION AND PLAN:  #1 diabetes without complication. Last visit was the first time her A1c rose above 6.5% (was 7.2% at that time). POC Hba1c today is 6.5%. Continue metformin 500 mg twice a day. Check electrolytes and creatinine today. Urine microalbumin/creatinine today.  2.  Chronic pain syndrome.  Chronic bilateral low back pain without sciatica and chronic bilateral knee pain due to severe osteoarthritis. Severe but stable.  Hopefully left total knee arthroplasty coming up on 09/03/2023 will be very helpful. Refilled oxycodone 5 mg, 1-2 3 times daily as needed, #180. UDS today.  An After Visit Summary was printed and given to the patient.  FOLLOW UP: Return in about 3 months (around 10/31/2023) for routine chronic illness f/u. Next cpe 04/2024 Signed:  Santiago Bumpers, MD           08/01/2023

## 2023-08-07 LAB — DRUG MONITORING PANEL 376104, URINE
Alphahydroxyalprazolam: NEGATIVE ng/mL (ref <25)
Alphahydroxymidazolam: NEGATIVE ng/mL (ref <50)
Alphahydroxytriazolam: NEGATIVE ng/mL (ref <50)
Aminoclonazepam: NEGATIVE ng/mL (ref <25)
Benzodiazepines: POSITIVE ng/mL — AB (ref <100)
Cocaine Metabolite: NEGATIVE ng/mL (ref <150)
Codeine: NEGATIVE ng/mL (ref <50)
Desmethyltramadol: NEGATIVE ng/mL (ref <100)
Hydrocodone: NEGATIVE ng/mL (ref <50)
Hydromorphone: NEGATIVE ng/mL (ref <50)
Hydroxyethylflurazepam: NEGATIVE ng/mL (ref <50)
Lorazepam: 771 ng/mL — ABNORMAL HIGH (ref <50)
Morphine: NEGATIVE ng/mL (ref <50)
Nordiazepam: NEGATIVE ng/mL (ref <50)
Norhydrocodone: NEGATIVE ng/mL (ref <50)
Noroxycodone: 2691 ng/mL — ABNORMAL HIGH (ref <50)
Opiates: NEGATIVE ng/mL (ref <100)
Oxazepam: NEGATIVE ng/mL (ref <50)
Oxycodone: 3576 ng/mL — ABNORMAL HIGH (ref <50)
Oxycodone: POSITIVE ng/mL — AB (ref <100)
Oxymorphone: 4124 ng/mL — ABNORMAL HIGH (ref <50)
Temazepam: NEGATIVE ng/mL (ref <50)
Tramadol Comments: NEGATIVE ng/mL (ref <300)
Tramadol: NEGATIVE ng/mL (ref <100)
Tramadol: NEGATIVE ng/mL (ref <500)
medMATCH Summary: NEGATIVE ng/mL (ref <100)

## 2023-08-07 LAB — DM TEMPLATE

## 2023-08-12 ENCOUNTER — Telehealth: Payer: Self-pay | Admitting: Family Medicine

## 2023-08-12 DIAGNOSIS — M25562 Pain in left knee: Secondary | ICD-10-CM | POA: Diagnosis not present

## 2023-08-12 NOTE — Telephone Encounter (Signed)
Document faxed from [facility] for Post Operative Narcotic plan.  Form also sent to providers S folder.  Form upfront in providers in box.

## 2023-08-13 NOTE — Telephone Encounter (Signed)
Placed on PCP desk to review and sign, if appropriate.  

## 2023-08-14 NOTE — Telephone Encounter (Signed)
Signed and put in box to go up front. Signed:  Santiago Bumpers, MD           08/14/2023

## 2023-08-15 DIAGNOSIS — J441 Chronic obstructive pulmonary disease with (acute) exacerbation: Secondary | ICD-10-CM | POA: Diagnosis not present

## 2023-08-15 DIAGNOSIS — R092 Respiratory arrest: Secondary | ICD-10-CM | POA: Diagnosis not present

## 2023-08-21 NOTE — Patient Instructions (Signed)
SURGICAL WAITING ROOM VISITATION Patients having surgery or a procedure may have no more than 2 support people in the waiting area - these visitors may rotate.    Children under the age of 35 must have an adult with them who is not the patient.  If the patient needs to stay at the hospital during part of their recovery, the visitor guidelines for inpatient rooms apply. Pre-op nurse will coordinate an appropriate time for 1 support person to accompany patient in pre-op.  This support person may not rotate.    Please refer to the [facility] website for the visitor guidelines for Inpatients (after your surgery is over and you are in a regular room).       Your procedure is scheduled on: 09-03-23   Report to [facility] Main Entrance    Report to admitting at 5:45 AM   Call this number if you have problems the morning of surgery [phone]   Do not eat food :After Midnight.   After Midnight you may have the following liquids until 5:15 AM DAY OF SURGERY  Water Non-Citrus Juices (without pulp, NO RED-Apple, White grape, White cranberry) Black Coffee (NO MILK/CREAM OR CREAMERS, sugar ok)  Clear Tea (NO MILK/CREAM OR CREAMERS, sugar ok) regular and decaf                             Plain Jell-O (NO RED)                                           Fruit ices (not with fruit pulp, NO RED)                                     Popsicles (NO RED)                                                               Sports drinks like Gatorade (NO RED)                   The day of surgery:  Drink ONE (1) Pre-Surgery G2 by 5:15 AM the morning of surgery. Drink in one sitting. Do not sip.  This drink was given to you during your hospital  pre-op appointment visit. Nothing else to drink after completing the Pre-Surgery G2.          If you have questions, please contact your surgeon's office.   FOLLOW  ANY ADDITIONAL PRE OP INSTRUCTIONS YOU RECEIVED FROM YOUR SURGEON'S OFFICE!!!      Oral Hygiene is also important to reduce your risk of infection.                                    Remember - BRUSH YOUR TEETH THE MORNING OF SURGERY WITH YOUR REGULAR TOOTHPASTE   Take these medicines the morning of surgery with A SIP OF WATER:   Fenofibrate  Lorazepam  Pantoprazole  Rosuvastatin  Okay to use inhalers  If  needed Tylenol, Oxycodone  Stop all vitamins and herbal supplements 7 days before surgery  How to Manage Your Diabetes Before and After Surgery  Why is it important to control my blood sugar before and after surgery? Improving blood sugar levels before and after surgery helps healing and can limit problems. A way of improving blood sugar control is eating a healthy diet by:  Eating less sugar and carbohydrates  Increasing activity/exercise  Talking with your doctor about reaching your blood sugar goals High blood sugars (greater than 180 mg/dL) can raise your risk of infections and slow your recovery, so you will need to focus on controlling your diabetes during the weeks before surgery. Make sure that the doctor who takes care of your diabetes knows about your planned surgery including the date and location.  How do I manage my blood sugar before surgery? Check your blood sugar at least 4 times a day, starting 2 days before surgery, to make sure that the level is not too high or low. Check your blood sugar the morning of your surgery when you wake up and every 2 hours until you get to the Short Stay unit. If your blood sugar is less than 70 mg/dL, you will need to treat for low blood sugar: Do not take insulin. Treat a low blood sugar (less than 70 mg/dL) with  cup of clear juice (cranberry or apple), 4 glucose tablets, OR glucose gel. Recheck blood sugar in 15 minutes after treatment (to make sure it is greater than 70 mg/dL). If your blood sugar is not greater than 70 mg/dL on recheck, call [phone] for further instructions. Report your blood sugar to the  short stay nurse when you get to Short Stay.  If you are admitted to the hospital after surgery: Your blood sugar will be checked by the staff and you will probably be given insulin after surgery (instead of oral diabetes medicines) to make sure you have good blood sugar levels. The goal for blood sugar control after surgery is 80-180 mg/dL.   WHAT DO I DO ABOUT MY DIABETES MEDICATION?  Do not take oral diabetes medicines (pills) the morning of surgery (do not take Metformin)   Reviewed and Endorsed by [facility] Patient Education Committee, August 2015  Bring CPAP mask and tubing day of surgery.                              You may not have any metal on your body including hair pins, jewelry, and body piercing             Do not wear make-up, lotions, powders, perfumes or deodorant  Do not wear nail polish including gel and S&S, artificial/acrylic nails, or any other type of covering on natural nails including finger and toenails. If you have artificial nails, gel coating, etc. that needs to be removed by a nail salon please have this removed prior to surgery or surgery may need to be canceled/ delayed if the surgeon/ anesthesia feels like they are unable to be safely monitored.   Do not shave  48 hours prior to surgery.     Do not bring valuables to the hospital. [place] IS NOT RESPONSIBLE   FOR VALUABLES.   Contacts, dentures or bridgework may not be worn into surgery.   Bring small overnight bag day of surgery.   DO NOT BRING YOUR HOME MEDICATIONS TO THE HOSPITAL. PHARMACY WILL DISPENSE MEDICATIONS  LISTED ON YOUR MEDICATION LIST TO YOU DURING YOUR ADMISSION IN THE HOSPITAL!    Special Instructions: Bring a copy of your healthcare power of attorney and living will documents the day of surgery if you haven't scanned them before.              Please read over the following fact sheets you were given: IF YOU HAVE QUESTIONS ABOUT YOUR PRE-OP INSTRUCTIONS PLEASE CALL  [phone] Brenda [name]  If you received a COVID test during your pre-op visit  it is requested that you wear a mask when out in public, stay away from anyone that may not be feeling well and notify your surgeon if you develop symptoms. If you test positive for Covid or have been in contact with anyone that has tested positive in the last 10 days please notify you surgeon.    Pre-operative 5 CHG [name] Instructions   You can play a key role in reducing the risk of infection after surgery. Your skin needs to be as free of germs as possible. You can reduce the number of germs on your skin by washing with CHG (chlorhexidine gluconate) soap before surgery. CHG is an antiseptic soap that kills germs and continues to kill germs even after washing.   DO NOT use if you have an allergy to chlorhexidine/CHG or antibacterial soaps. If your skin becomes reddened or irritated, stop using the CHG and notify one of our RNs at [phone].   Please shower with the CHG soap starting 4 days before surgery using the following schedule:     Please keep in mind the following:  DO NOT shave, including legs and underarms, starting the day of your first shower.   You may shave your face at any point before/day of surgery.  Place clean sheets on your bed the day you start using CHG soap. Use a clean washcloth (not used since being washed) for each shower. DO NOT sleep with pets once you start using the CHG.   CHG Shower Instructions:  If you choose to wash your hair and private area, wash first with your normal shampoo/soap.  After you use shampoo/soap, rinse your hair and body thoroughly to remove shampoo/soap residue.  Turn the water OFF and apply about 3 tablespoons (45 ml) of CHG soap to a CLEAN washcloth.  Apply CHG soap ONLY FROM YOUR NECK DOWN TO YOUR TOES (washing for 3-5 minutes)  DO NOT use CHG soap on face, private areas, open wounds, or sores.  Pay special attention to the area where your surgery is being  performed.  If you are having back surgery, having someone wash your back for you may be helpful. Wait 2 minutes after CHG soap is applied, then you may rinse off the CHG soap.  Pat dry with a clean towel  Put on clean clothes/pajamas   If you choose to wear lotion, please use ONLY the CHG-compatible lotions on the back of this paper.     Additional instructions for the day of surgery: DO NOT APPLY any lotions, deodorants, cologne, or perfumes.   Put on clean/comfortable clothes.  Brush your teeth.  Ask your nurse before applying any prescription medications to the skin.      CHG Compatible Lotions   Aveeno Moisturizing lotion  Cetaphil Moisturizing Cream  Cetaphil Moisturizing Lotion  Clairol Herbal Essence Moisturizing Lotion, Dry Skin  Clairol Herbal Essence Moisturizing Lotion, Extra Dry Skin  Clairol Herbal Essence Moisturizing Lotion, Normal Skin  Curel Age Defying Therapeutic Moisturizing  Lotion with Alpha Hydroxy  Curel Extreme Care Body Lotion  Curel Soothing Hands Moisturizing Hand Lotion  Curel Therapeutic Moisturizing Cream, Fragrance-Free  Curel Therapeutic Moisturizing Lotion, Fragrance-Free  Curel Therapeutic Moisturizing Lotion, Original Formula  Eucerin Daily Replenishing Lotion  Eucerin Dry Skin Therapy Plus Alpha Hydroxy Crme  Eucerin Dry Skin Therapy Plus Alpha Hydroxy Lotion  Eucerin Original Crme  Eucerin Original Lotion  Eucerin Plus Crme Eucerin Plus Lotion  Eucerin TriLipid Replenishing Lotion  Keri Anti-Bacterial Hand Lotion  Keri Deep Conditioning Original Lotion Dry Skin Formula Softly Scented  Keri Deep Conditioning Original Lotion, Fragrance Free Sensitive Skin Formula  Keri Lotion Fast Absorbing Fragrance Free Sensitive Skin Formula  Keri Lotion Fast Absorbing Softly Scented Dry Skin Formula  Keri Original Lotion  Keri Skin Renewal Lotion Keri Silky Smooth Lotion  Keri Silky Smooth Sensitive Skin Lotion  Nivea Body Creamy Conditioning  Oil  Nivea Body Extra Enriched Lotion  Nivea Body Original Lotion  Nivea Body Sheer Moisturizing Lotion Nivea Crme  Nivea Skin Firming Lotion  NutraDerm 30 Skin Lotion  NutraDerm Skin Lotion  NutraDerm Therapeutic Skin Cream  NutraDerm Therapeutic Skin Lotion  ProShield Protective Hand Cream  Provon moisturizing lotion   PATIENT SIGNATURE_________________________________  NURSE SIGNATURE__________________________________  ________________________________________________________________________    Rogelia Mire  An incentive spirometer is a tool that can help keep your lungs clear and active. This tool measures how well you are filling your lungs with each breath. Taking long deep breaths may help reverse or decrease the chance of developing breathing (pulmonary) problems (especially infection) following: A long period of time when you are unable to move or be active. BEFORE THE PROCEDURE  If the spirometer includes an indicator to show your best effort, your nurse or respiratory therapist will set it to a desired goal. If possible, sit up straight or lean slightly forward. Try not to slouch. Hold the incentive spirometer in an upright position. INSTRUCTIONS FOR USE  Sit on the edge of your bed if possible, or sit up as far as you can in bed or on a chair. Hold the incentive spirometer in an upright position. Breathe out normally. Place the mouthpiece in your mouth and seal your lips tightly around it. Breathe in slowly and as deeply as possible, raising the piston or the ball toward the top of the column. Hold your breath for 3-5 seconds or for as long as possible. Allow the piston or ball to fall to the bottom of the column. Remove the mouthpiece from your mouth and breathe out normally. Rest for a few seconds and repeat Steps 1 through 7 at least 10 times every 1-2 hours when you are awake. Take your time and take a few normal breaths between deep breaths. The spirometer  may include an indicator to show your best effort. Use the indicator as a goal to work toward during each repetition. After each set of 10 deep breaths, practice coughing to be sure your lungs are clear. If you have an incision (the cut made at the time of surgery), support your incision when coughing by placing a pillow or rolled up towels firmly against it. Once you are able to get out of bed, walk around indoors and cough well. You may stop using the incentive spirometer when instructed by your caregiver.  RISKS AND COMPLICATIONS Take your time so you do not get dizzy or light-headed. If you are in pain, you may need to take or ask for pain medication before doing incentive spirometry. It  is harder to take a deep breath if you are having pain. AFTER USE Rest and breathe slowly and easily. It can be helpful to keep track of a log of your progress. Your caregiver can provide you with a simple table to help with this. If you are using the spirometer at home, follow these instructions: SEEK MEDICAL CARE IF:  You are having difficultly using the spirometer. You have trouble using the spirometer as often as instructed. Your pain medication is not giving enough relief while using the spirometer. You develop fever of 100.5 F (38.1 C) or higher. SEEK IMMEDIATE MEDICAL CARE IF:  You cough up bloody sputum that had not been present before. You develop fever of 102 F (38.9 C) or greater. You develop worsening pain at or near the incision site. MAKE SURE YOU:  Understand these instructions. Will watch your condition. Will get help right away if you are not doing well or get worse. Document Released: 03/04/2007 Document Revised: 01/14/2012 Document Reviewed: 05/05/2007 [facility] Patient Information 2014 [place], [place].   ________________________________________________________________________

## 2023-08-22 ENCOUNTER — Encounter (HOSPITAL_COMMUNITY): Payer: Self-pay

## 2023-08-22 ENCOUNTER — Other Ambulatory Visit: Payer: Self-pay

## 2023-08-22 ENCOUNTER — Encounter (HOSPITAL_COMMUNITY)
Admission: RE | Admit: 2023-08-22 | Discharge: 2023-08-22 | Disposition: A | Discharge status: Home / Self Care | Payer: Medicare PPO | Source: Ambulatory Visit | Attending: Orthopedic Surgery | Admitting: Orthopedic Surgery

## 2023-08-22 VITALS — BP 143/79 | HR 92 | Temp 99.0°F | Resp 18 | Ht 64.0 in | Wt 198.0 lb

## 2023-08-22 DIAGNOSIS — E119 Type 2 diabetes mellitus without complications: Secondary | ICD-10-CM | POA: Insufficient documentation

## 2023-08-22 DIAGNOSIS — Z01818 Encounter for other preprocedural examination: Secondary | ICD-10-CM

## 2023-08-22 DIAGNOSIS — Z01812 Encounter for preprocedural laboratory examination: Secondary | ICD-10-CM | POA: Insufficient documentation

## 2023-08-22 DIAGNOSIS — K769 Liver disease, unspecified: Secondary | ICD-10-CM | POA: Diagnosis not present

## 2023-08-22 LAB — SURGICAL PCR SCREEN
MRSA, PCR: NEGATIVE
Staphylococcus aureus: NEGATIVE

## 2023-08-22 LAB — COMPREHENSIVE METABOLIC PANEL
ALT: 25 U/L (ref 0–44)
AST: 34 U/L (ref 15–41)
Albumin: 4.1 g/dL (ref 3.5–5.0)
Alkaline Phosphatase: 52 U/L (ref 38–126)
Anion gap: 11 (ref 5–15)
BUN: 15 mg/dL (ref 8–23)
CO2: 27 mmol/L (ref 22–32)
Calcium: 9.8 mg/dL (ref 8.9–10.3)
Chloride: 103 mmol/L (ref 98–111)
Creatinine, Ser: 0.75 mg/dL (ref 0.44–1.00)
GFR, Estimated: >60 mL/min (ref >60)
Glucose, Bld: 127 mg/dL — ABNORMAL HIGH (ref 70–99)
Potassium: 4.4 mmol/L (ref 3.5–5.1)
Sodium: 141 mmol/L (ref 135–145)
Total Bilirubin: 0.9 mg/dL (ref 0.3–1.2)
Total Protein: 7.4 g/dL (ref 6.5–8.1)

## 2023-08-22 LAB — CBC
HCT: 42.1 % (ref 36.0–46.0)
Hemoglobin: 13.9 g/dL (ref 12.0–15.0)
MCH: 31.2 pg (ref 26.0–34.0)
MCHC: 33 g/dL (ref 30.0–36.0)
MCV: 94.6 fL (ref 80.0–100.0)
Platelets: 136 10*3/uL — ABNORMAL LOW (ref 150–400)
RBC: 4.45 MIL/uL (ref 3.87–5.11)
RDW: 12.7 % (ref 11.5–15.5)
WBC: 9.9 10*3/uL (ref 4.0–10.5)
nRBC: 0 % (ref 0.0–0.2)

## 2023-08-22 LAB — GLUCOSE, CAPILLARY: Glucose-Capillary: 133 mg/dL — ABNORMAL HIGH (ref 70–99)

## 2023-08-22 NOTE — Progress Notes (Addendum)
COVID Vaccine received:  [x]  No []  Yes Date of any COVID positive Test in last 90 days: no PCP - Nicoletta Ba MD Cardiologist - Donato Schultz MD Pulmon- MattherHunsucker MD  Chest x-ray -  EKG -   Stress Test - 07/10/23 Epic ECHO -07/10/23 Epic  Cardiac Cath - 05/16/15 Epic  Bowel Prep - [x]  No  []   Yes ______  Pacemaker / ICD device [x]  No []  Yes   Spinal Cord Stimulator:[x]  No []  Yes       History of Sleep Apnea? [x]  No []  Yes   CPAP used?- [x]  No []  Yes    Does the patient monitor blood sugar?          []  No  Yes  []  N/A  Patient has: []  NO Hx DM   []  Pre-DM                 []  DM1  [x]   DM2 Does patient have a [employer] or Dexacom? []  No []  Yes   Fasting Blood Sugar Ranges- 120's Checks Blood Sugar 2 times a day  GLP1 agonist / usual dose - no GLP1 instructions:  SGLT-2 inhibitors / usual dose - no SGLT-2 instructions:   Blood Thinner / Instructions:Plavix- Hold x 5days before surgery. Last dose 08/28/23 Aspirin Instructions:  Comments: Hold Humira 2 weeks prior to surgery. Last dose was 10/14 Activity level: Patient is  unable to climb a flight of stairs without difficulty; [x]  No CP  [x]  No SOB, but would have _knee pain__   Patient can  perform ADLs without assistance.   Anesthesia review: DM, CAD, Aortic stenosis, R BBB, COPD, NASH, Wears 02 2L at night   Patient denies shortness of breath, fever, cough and chest pain at PAT appointment.  Patient verbalized understanding and agreement to the Pre-Surgical Instructions that were given to them at this PAT appointment. Patient was also educated of the need to review these PAT instructions again prior to his/her surgery.I reviewed the appropriate phone numbers to call if they have any and questions or concerns.

## 2023-08-23 NOTE — Anesthesia Preprocedure Evaluation (Addendum)
Anesthesia Evaluation  Patient identified by MRN, date of birth, ID band Patient awake    Reviewed: Allergy & Precautions, NPO status , Patient's Chart, lab work & pertinent test results  History of Anesthesia Complications (+) PONV and history of anesthetic complications  Airway Mallampati: II  TM Distance: >3 FB Neck ROM: Full    Dental  (+) Dental Advisory Given, Missing   Pulmonary pneumonia, COPD, former smoker   Pulmonary exam normal breath sounds clear to auscultation       Cardiovascular + angina  + CAD  + dysrhythmias + Valvular Problems/Murmurs  Rhythm:Regular Rate:Normal + Systolic murmurs Echo 07/2023 1. Left ventricular ejection fraction, by estimation, is 65 to 70%. Left  ventricular ejection fraction by 3D volume is 69 %. The left ventricle has  normal function. The left ventricle has no regional wall motion  abnormalities. There is mild left  ventricular hypertrophy. Left ventricular diastolic parameters are  consistent with Grade I diastolic dysfunction (impaired relaxation).   2. Right ventricular systolic function is normal. The right ventricular  size is normal. There is normal pulmonary artery systolic pressure. The  estimated right ventricular systolic pressure is 24.2 mmHg.   3. The mitral valve is grossly normal. Trivial mitral valve  regurgitation.   4. The aortic valve is tricuspid. Aortic valve regurgitation is trivial.  Mild aortic valve stenosis. Aortic valve area, by VTI measures 1.63 cm.  Aortic valve mean gradient measures 15.7 mmHg. Aortic valve Vmax measures  2.76 m/s. Peak gradient 30.4  mmHg, DI is 0.39.   5. The inferior vena cava is normal in size with greater than 50%  respiratory variability, suggesting right atrial pressure of 3 mmHg.      Neuro/Psych  PSYCHIATRIC DISORDERS Anxiety      Neuromuscular disease    GI/Hepatic ,GERD  ,,(+) Hepatitis -  Endo/Other  diabetes     Renal/GU      Musculoskeletal  (+) Arthritis , Osteoarthritis,    Abdominal  (+) + obese  Peds  Hematology   Anesthesia Other Findings Fecal occult blood test positive  Reproductive/Obstetrics                             Anesthesia Physical Anesthesia Plan  ASA: 3  Anesthesia Plan: General   Post-op Pain Management: Regional block* and Tylenol PO (pre-op)*   Induction:   PONV Risk Score and Plan: 4 or greater and Ondansetron, Dexamethasone, Treatment may vary due to age or medical condition and Midazolam  Airway Management Planned: LMA  Additional Equipment:   Intra-op Plan:   Post-operative Plan: Extubation in OR  Informed Consent: I have reviewed the patients History and Physical, chart, labs and discussed the procedure including the risks, benefits and alternatives for the proposed anesthesia with the patient or authorized representative who has indicated his/her understanding and acceptance.     Dental advisory given  Plan Discussed with: CRNA  Anesthesia Plan Comments: (See PAT note from 10/17 by Sherlie Ban PA-C )        Anesthesia Quick Evaluation

## 2023-08-23 NOTE — H&P (Signed)
KNEE ARTHROPLASTY ADMISSION H&P  Patient ID: Brenda [name] MRN: [id number] DOB/AGE: 08/04/1956 67 y.o.  Chief Complaint: left knee pain.  Planned Procedure Date: 09/03/23 Medical Clearance by Dr. Milinda Cave   Cardiac Clearance by Dr. Anne Fu Pulmonary clearance by Dr. Judeth Horn Rheumatology clearance by Dr. Deanne Coffer Pain management clearance by Dr. Milinda Cave   HPI: Brenda [name] is a 67 y.o. female who presents for evaluation of OA LEFT KNEE. The patient has a history of pain and functional disability in the left knee due to arthritis and has failed non-surgical conservative treatments for greater than 12 weeks to include NSAID's and/or analgesics, corticosteriod injections, use of assistive devices, and activity modification.  Onset of symptoms was gradual, starting 5 years ago with gradually worsening course since that time. The patient noted prior procedures on the knee to include  arthroscopy and menisectomy on the left knee.  Patient currently rates pain at 8 out of 10 with activity. Patient has night pain, worsening of pain with activity and weight bearing, and pain that interferes with activities of daily living.  Patient has evidence of subchondral sclerosis, periarticular osteophytes, and joint space narrowing by imaging studies.  There is no active infection.  Past Medical History:  Diagnosis Date   Allergy    Anxiety    Aortic valve stenosis    "very mild"   Atypical chest pain 2007; 2015   cardiolyte neg, echo nl, cath showed mild/nonobstructive LAD disease   Bilateral primary osteoarthritis of knee 06/30/2012   Right >L.  Right unicompartmental knee arthroplasty 10/25/18   CAD (coronary artery disease)    Chronic LBP    Multiple surgeries   COPD (chronic obstructive pulmonary disease) (HCC)    COVID-19 virus infection 06/02/2019   Eval at [facility] ED and d/c'd home.  Admitted 06/12/19 with hypoxic RF and bilat infiltrates.   DDD (degenerative disc disease)    spinal stenosis    Diabetes mellitus without complication (HCC)    04/2023 a1c 7.2%   Elevated liver enzymes    2021 after starting methotrexate. 2022->rheum d/c'd her methotrexate   Her abd u/s 12/05/20 showed fatty liver, o/w normal.   Fatty liver    11/2020 liver u/s   Gastritis    H pylori NEG on EGD 02/2021   GERD (gastroesophageal reflux disease)    History of adenomatous polyp of colon 06/2021   2022, recall 5 yrs   History of GI bleed    NSAIDS   Hyperlipidemia    mixed   Microscopic hematuria    full w/ u unrevealing X 2   Normal nuclear stress test 11/11 and 06/2014   negative, EF normal   Oxygen deficiency    Just At night   Palpitations 2006   48H holter neg   Positive occult stool blood test    iFOB positive 04/2021->colonoscopy showed multiple adenomatous polyps and nonbleeding int hem.   RBBB (right bundle branch block)    Recurrent UTI    Seronegative rheumatoid arthritis (HCC) 10/2019   multiple sites->pred started, methotrex started 10/2019; rheum to add humira as of 03/2020   Tobacco dependence in remission    Quit fall 2015.     Past Surgical History:  Procedure Laterality Date   ABDOMINAL HYSTERECTOMY  1997   DUB   APPENDECTOMY  1984   BIOPSY  02/14/2021   Procedure: BIOPSY;  Surgeon: Lanelle Bal, DO;  Location: [facility];  Service: Endoscopy;;   CARDIAC CATHETERIZATION  10/09/2005   no  CAD, mildly elevated LVEDP, normal LV function (Dr. Laurell Josephs)   CARDIAC CATHETERIZATION N/A 05/16/2015   Mild non-obstructive CAD--med mgmt.  Procedure: Left Heart Cath and Coronary Angiography;  Surgeon: Chrystie Nose, MD;  Location: [facility] INVASIVE CV LAB;  Service: Cardiovascular;  Laterality: N/A;   CARDIOVASCULAR STRESS TEST  06/2014   normal lexiscan NST   CARDIOVASCULAR STRESS TEST  2006   persantine - no ischemia, low risk    COLONOSCOPY W/ POLYPECTOMY  06/12/2021   multiple adenomas--recall 3-5 yrs   COLONOSCOPY WITH PROPOFOL N/A 06/12/2021   Procedure: COLONOSCOPY WITH  PROPOFOL;  Surgeon: Lanelle Bal, DO;  Location: [facility];  Service: Endoscopy;  Laterality: N/A;  8:30am   ESOPHAGOGASTRODUODENOSCOPY  02/14/2021   Gastritis ( H pylori NEG) w/out bleeding   ESOPHAGOGASTRODUODENOSCOPY (EGD) WITH PROPOFOL N/A 02/14/2021   Procedure: ESOPHAGOGASTRODUODENOSCOPY (EGD) WITH PROPOFOL;  Surgeon: Lanelle Bal, DO;  Location: [facility];  Service: Endoscopy;  Laterality: N/A;  AM   JOINT REPLACEMENT  11-05-2017   Partial right knee replacement   KNEE ARTHROSCOPY Bilateral    left wrist ganglion cyst excision  2010   LUMBAR SPINE SURGERY  1993   right iliac crest bone graft+metal instrumentation; 2005 metal removal and decompression, 2011 lumbar decompression 4-11, then stabilization/ instrumentation done 09-19-10: L2,L3, L5 left and L2 , L3, L4 right pedical remnant L4 left embedded. Left iliac crest bone graft-- Dr Lily Lovings SPINE SURGERY  02/10/2016   Dr. Clotilde Dieter: lumbar decompression, instrumentation removal, and fusion exploration--HELPED her a lot, esp her radicular leg pains.   OOPHORECTOMY Right    cyst   PARTIAL KNEE ARTHROPLASTY Right 11/04/2018   Procedure: UNICOMPARTMENTAL KNEE;  Surgeon: Sheral Apley, MD;  Location: [facility];  Service: Orthopedics;  Laterality: Right;  Adductor Block   POLYPECTOMY  06/12/2021   Procedure: POLYPECTOMY;  Surgeon: Lanelle Bal, DO;  Location: [facility];  Service: Endoscopy;;   SPINE SURGERY  05-24-1991   Dr Clotilde Dieter surgeon [facility]. 2 level spinal fusion. 09-22-1998, 06-21-2004 took plates and screws out also got bone off of nerves. 2012 took out plates and screws and got bones off of nerves. 2017 2 level spinal fusion and scraped bones away from nerves.   TONSILLECTOMY  67 yrs old   TRANSTHORACIC ECHOCARDIOGRAM  2006   EF=>55%, trace MR, mild TR, trace AV regurg, trace pulm valve regurg    TUBAL LIGATION     Allergies  Allergen Reactions   Penicillins Anaphylaxis and Swelling    DID THE  REACTION INVOLVE: Swelling of the face/tongue/throat, SOB, or low BP? Yes Sudden or severe rash/hives, skin peeling, or the inside of the mouth or nose? Yes Did it require medical treatment? Yes When did it last happen? 67 years old If all above answers are "NO", may proceed with cephalosporin use.    Aspirin Other (See Comments)     GI Bleed   Beta Adrenergic Blockers Other (See Comments)    REACTION: decreased libido   Citalopram Nausea And Vomiting   Nsaids Other (See Comments)     GI Upset   Other Hives and Itching    Walnuts    Plaquenil [Hydroxychloroquine Sulfate] Other (See Comments)    Tachycardia   Prochlorperazine Edisylate Other (See Comments)     Stroke like symptoms   Sulfonamide Derivatives Other (See Comments)    Unknown allergic reaction   Amitriptyline Hcl Palpitations and Other (See Comments)     increased  heart rate   Prior to Admission medications   Medication Sig Start Date End Date Taking? Authorizing Provider  acetaminophen (TYLENOL) 500 MG tablet Take 500 mg by mouth every 6 (six) hours as needed for fever.   Yes [provider]  albuterol (VENTOLIN HFA) 108 (90 Base) MCG/ACT inhaler Inhale 2 puffs into the lungs every 4 (four) hours as needed for wheezing or shortness of breath (bronchitis). Reported on 03/12/2016 10/19/21  Yes McGowen, Maryjean Morn, MD  clopidogrel (PLAVIX) 75 MG tablet TAKE 1 TABLET(75 MG) BY MOUTH DAILY 08/01/23  Yes McGowen, Maryjean Morn, MD  cyclobenzaprine (FLEXERIL) 10 MG tablet Take 1 tablet (10 mg total) by mouth 3 (three) times daily as needed for muscle spasms. 07/01/23  Yes McGowen, Maryjean Morn, MD  fenofibrate 160 MG tablet Take 1 tablet (160 mg total) by mouth daily. with food 05/02/23  Yes McGowen, Maryjean Morn, MD  lisinopril (ZESTRIL) 5 MG tablet TAKE 1 TABLET(5 MG) BY MOUTH DAILY 08/01/23  Yes McGowen, Maryjean Morn, MD  LORazepam (ATIVAN) 1 MG tablet TAKE 1 TABLET(1 MG) BY MOUTH TWICE DAILY 08/01/23  Yes McGowen, Maryjean Morn, MD  metFORMIN  (GLUCOPHAGE) 500 MG tablet Take 1 tablet (500 mg total) by mouth 2 (two) times daily with a meal. 08/01/23  Yes McGowen, Maryjean Morn, MD  nitroGLYCERIN (NITROSTAT) 0.4 MG SL tablet Place 1 tablet (0.4 mg total) under the tongue every 5 (five) minutes as needed for chest pain (MAX 3 doses.). 09/24/19  Yes McGowen, Maryjean Morn, MD  oxyCODONE (OXY IR/ROXICODONE) 5 MG immediate release tablet 1-2 po tid pain 08/01/23  Yes McGowen, Maryjean Morn, MD  OXYGEN Inhale 2.5 L into the lungs at bedtime.   Yes [provider]  pantoprazole (PROTONIX) 40 MG tablet TAKE 1 TABLET(40 MG) BY MOUTH DAILY 08/01/23  Yes McGowen, Maryjean Morn, MD  rosuvastatin (CRESTOR) 40 MG tablet TAKE 1 TABLET(40 MG) BY MOUTH DAILY 05/02/23  Yes McGowen, Maryjean Morn, MD  Tiotropium Bromide-Olodaterol (STIOLTO RESPIMAT) 2.5-2.5 MCG/ACT AERS Inhale 2 puffs into the lungs daily. 03/18/23  Yes Hunsucker, Lesia Sago, MD  Adalimumab (HUMIRA) 40 MG/0.8ML PSKT Inject 40 mg into the skin every 14 (fourteen) days.    [provider]  folic acid (FOLVITE) 1 MG tablet Take 1 mg by mouth daily.  10/27/19   [provider]   Social History   Socioeconomic History   Marital status: Married    Spouse name: Not on file   Number of children: Not on file   Years of education: Not on file   Highest education level: GED or equivalent  Occupational History   Not on file  Tobacco Use   Smoking status: Former    Current packs/day: 0.00    Average packs/day: 1 pack/day for 35.0 years (35.0 ttl pk-yrs)    Types: Cigarettes    Start date: 07/23/1979    Quit date: 07/22/2014    Years since quitting: 9.0   Smokeless tobacco: Never   Tobacco comments:    down to ~2 cigarettes/daily (06/23/14) - Quit Smoking around 07/20/14!  Vaping Use   Vaping status: Never Used  Substance and Sexual Activity   Alcohol use: No   Drug use: No   Sexual activity: Yes    Partners: Male    Birth control/protection: Surgical, Other-see comments    Comment:  Hysterectomy  Other Topics Concern   Not on file  Social History Narrative   Not on file   Social Determinants of Health  Financial Resource Strain: Low Risk  (05/01/2023)   Overall Financial Resource Strain (CARDIA)    Difficulty of Paying Living Expenses: Not very hard  Food Insecurity: No Food Insecurity (05/01/2023)   Hunger Vital Sign    Worried About Running Out of Food in the Last Year: Never true    Ran Out of Food in the Last Year: Never true  Transportation Needs: No Transportation Needs (05/01/2023)   PRAPARE - Administrator, Civil Service (Medical): No    Lack of Transportation (Non-Medical): No  Physical Activity: Unknown (05/01/2023)   Exercise Vital Sign    Days of Exercise per Week: Patient declined    Minutes of Exercise per Session: 20 min  Recent Concern: Physical Activity - Insufficiently Active (04/17/2023)   Exercise Vital Sign    Days of Exercise per Week: 2 days    Minutes of Exercise per Session: 20 min  Stress: No Stress Concern Present (05/01/2023)   [employer] of Occupational Health - Occupational Stress Questionnaire    Feeling of Stress : Only a little  Social Connections: Socially Integrated (05/01/2023)   Social Connection and Isolation Panel [NHANES]    Frequency of Communication with Friends and Family: More than three times a week    Frequency of Social Gatherings with Friends and Family: Twice a week    Attends Religious Services: More than 4 times per year    Active Member of [employer] or Organizations: Yes    Attends Engineer, structural: More than 4 times per year    Marital Status: Married   Family History  Problem Relation Age of Onset   Heart Problems Mother        and thyroid problems   Hyperlipidemia Mother    Diabetes Mother    Breast cancer Mother    Arthritis Mother    Heart failure Father        CHF, heart attack, diabetes, hyperlipidemia   Alcohol abuse Father    Heart disease Father    Heart disease  Brother        back problems   Hypertension Brother    Alcohol abuse Brother    Varicose Veins Daughter    Kidney failure Maternal Grandmother    Stroke Maternal Grandfather    Cancer Paternal Grandmother        mouth - snuff   Heart attack Paternal Grandfather        stroke, HTN   Cancer Brother    Hyperlipidemia Brother    Heart attack Brother    Cancer Brother    Miscarriages / Stillbirths Sister    Coronary artery disease Neg Hx     ROS: Currently denies lightheadedness, dizziness, Fever, chills, CP, SOB.   No personal history of DVT, PE, MI, or CVA. No loose teeth or dentures All other systems have been reviewed and were otherwise currently negative with the exception of those mentioned in the HPI and as above.  Objective: Vitals: Ht: 5'4" Wt: 201.6 lbs Temp: 98.9 BP: 129/80 Pulse: 90 O2 96% on room air.   Physical Exam: General: Alert, NAD.  Antalgic Gait  HEENT: EOMI, Good Neck Extension  Pulm: Moderate inspiratory wheezing auscultated bilaterally in all lung fields, particularly the lower lung fields in the posterior region. No rales or rhonchi CV: RRR, No g/r appreciated. High pitched crescendo/decrescendo murmur heard best at the right upper sternal border, radiating towards the carotid arteries with a whooshing sound auscultated GI: soft, NT, ND. BS x  4 quadrants Neuro: CN II-XII grossly intact without focal deficit.  Sensation intact distally Skin: No lesions in the area of chief complaint MSK/Surgical Site:  + JLT. ROM 10-90 degrees. Decreased strength in extension and flexion.  +EHL/FHL.  NVI.  Pain and instability with varus and valgus stress.    Imaging Review Plain radiographs demonstrate moderate degenerative joint disease of the left knee.   The overall alignment ismild varus. The bone quality appears to be fair for age and reported activity level.  Preoperative templating of the joint replacement has been completed, documented, and submitted to the  Operating Room personnel in order to optimize intra-operative equipment management.  Assessment: OA LEFT KNEE Active Problems:   * No active hospital problems. *   Plan: Plan for Procedure(s): TOTAL KNEE ARTHROPLASTY  The patient history, physical exam, clinical judgement of the provider and imaging are consistent with end stage degenerative joint disease and total joint arthroplasty is deemed medically necessary. The treatment options including medical management, injection therapy, and arthroplasty were discussed at length. The risks and benefits of Procedure(s): TOTAL KNEE ARTHROPLASTY were presented and reviewed.  The risks of nonoperative treatment, versus surgical intervention including but not limited to continued pain, aseptic loosening, stiffness, dislocation/subluxation, infection, bleeding, nerve injury, blood clots, cardiopulmonary complications, morbidity, mortality, among others were discussed. The patient verbalizes understanding and wishes to proceed with the plan.  Patient is being admitted for inpatient treatment for surgery, pain control, PT, prophylactic antibiotics, VTE prophylaxis, progressive ambulation, ADL's and discharge planning. She will spend the night in observation.  Dental prophylaxis discussed and recommended for 2 years postoperatively.  The patient does meet the criteria for TXA which will be used perioperatively.   Plavix will be resumed POD 1. Lovenox or Arixtra will be used postoperatively for DVT prophylaxis in addition to SCDs, and early ambulation. Unable to take ASA due to h/o GI bleed.  Plan for Oxycodone for pain. She is unable to take Tylenol due to liver disease and NSAIDs due to being on Plavix. Robaxin for muscle spasms.   Zofran for nausea and vomiting. Pharmacy- [facility] The patient is planning to be discharged home with OPPT and into the care of her husband Gery Pray who can be reached at [phone] Follow up appt 09/18/23 at  4:15pm     Marzetta Board Office [phone] 08/23/2023 3:09 PM

## 2023-08-23 NOTE — Progress Notes (Signed)
DISCUSSION: Brenda [name] is a 67 yo female who presents to PAT prior to L TKA with Dr.Murphy on 09/03/23. PMH of forme smoking (quit 2015), CAD, AS, COPD, nocturnal dyspnea (on 2.5L O2 at night), DM, GERD, RA (on Humira), anxiety.  Follows with Cardiology for hx of CAD and AS. Was evaluated for chest pain in 2015 which was felt to be atypical. She ultimately underwent a cath on 05/16/2015 which showed mild proximal to mid LAD stenosis of only 40%. There was also a 30% distal LAD lesion. Continued medical therapy was recommended. She was lost to follow up and then was last seen on 06/19/23 for preop clearance. Stress test and echo were ordered. Stress test was normal, echo showed bicuspid valve with mild AS. She was cleared for surgery:   "Normal stress test. May proceed with surgery -Dr. Anne Fu -Okay to hold plavix 5 days and resume when medically safe to do so."  Patient follows with Pulmonology for COPD, nocturnal dyspnea, chronic DOE, and hx of tobacco abuse. Last seen on 03/18/23. Stable on inhalers. Polysomnography negative for sleep apnea. PFTs largely normal. Suspect desats related to OHS. Preop eval by Dr. Judeth Horn on 05/14/23:  "Preoperative evaluation: Pulmonary medicine does not provide preoperative clearance rather preoperative risk stratification.  Based on the ARISCAT model, if duration of surgery is less than 2 hours the patient is low 1.6% risk of postoperative pulmonary complication.  If duration of procedure is greater than 2 hours the patient is intermediate or 13.3% risk of postoperative pulmonary complication.  There are no modifiable risk factors to address prior to surgery.  See recommendations below. --Please provide DuoNebs in the preoperative area --Please provide DuoNebs in the PACU --Avoid prolonged neuromuscular blockade"   VS: BP (!) 143/79   Pulse 92   Temp 37.2 C (Oral)   Resp 18   Ht 5\' 4"  (1.626 m)   Wt 89.8 kg   SpO2 95%   BMI 33.99 kg/m    PROVIDERS: McGowen, Maryjean Morn, MD Cardiology: Donato Schultz, MD Pulmonology: Vilma Meckel, MD  LABS: Labs reviewed: Acceptable for surgery. (all labs ordered are listed, but only abnormal results are displayed)  Labs Reviewed  COMPREHENSIVE METABOLIC PANEL - Abnormal; Notable for the following components:      Result Value   Glucose, Bld 127 (*)    All other components within normal limits  CBC - Abnormal; Notable for the following components:   Platelets 136 (*)    All other components within normal limits  GLUCOSE, CAPILLARY - Abnormal; Notable for the following components:   Glucose-Capillary 133 (*)    All other components within normal limits  SURGICAL PCR SCREEN     IMAGES:   EKG 06/19/23  Normal sinus rhythm, rate 80 Right bundle branch block   CV:  Echo 07/10/23:  IMPRESSIONS     1. Left ventricular ejection fraction, by estimation, is 65 to 70%. Left  ventricular ejection fraction by 3D volume is 69 %. The left ventricle has  normal function. The left ventricle has no regional wall motion  abnormalities. There is mild left  ventricular hypertrophy. Left ventricular diastolic parameters are  consistent with Grade I diastolic dysfunction (impaired relaxation).   2. Right ventricular systolic function is normal. The right ventricular  size is normal. There is normal pulmonary artery systolic pressure. The  estimated right ventricular systolic pressure is 24.2 mmHg.   3. The mitral valve is grossly normal. Trivial mitral valve  regurgitation.   4. The aortic  valve is tricuspid. Aortic valve regurgitation is trivial.  Mild aortic valve stenosis. Aortic valve area, by VTI measures 1.63 cm.  Aortic valve mean gradient measures 15.7 mmHg. Aortic valve Vmax measures  2.76 m/s. Peak gradient 30.4  mmHg, DI is 0.39.   5. The inferior vena cava is normal in size with greater than 50%  respiratory variability, suggesting right atrial pressure of 3 mmHg.    Stress test 07/10/23:    The study is normal. The study is low risk.   No ST deviation was noted.   LV perfusion is normal. There is no evidence of ischemia. There is no evidence of infarction.   Left ventricular function is normal. Nuclear stress EF: 66%. The left ventricular ejection fraction is hyperdynamic (>65%). End diastolic cavity size is normal. End systolic cavity size is normal. No evidence of transient ischemic dilation (TID) noted.   Prior study available for comparison from 06/29/2014. No changes compared to prior study.  Left heart cath 05/16/2015:  Prox LAD to Mid LAD lesion, 40% stenosed. Dist LAD lesion, 30% stenosed.   Mild non-obstructive CAD. No significant interval change compared to the prior cath report in 2006. Will add plavix 75 mg daily on discharge. Follow-up in 2 week  Past Medical History:  Diagnosis Date   Allergy    Anxiety    Aortic valve stenosis    "very mild"   Atypical chest pain 2007; 2015   cardiolyte neg, echo nl, cath showed mild/nonobstructive LAD disease   Bilateral primary osteoarthritis of knee 06/30/2012   Right >L.  Right unicompartmental knee arthroplasty 10/25/18   CAD (coronary artery disease)    Chronic LBP    Multiple surgeries   COPD (chronic obstructive pulmonary disease) (HCC)    COVID-19 virus infection 06/02/2019   Eval at [facility] ED and d/c'd home.  Admitted 06/12/19 with hypoxic RF and bilat infiltrates.   DDD (degenerative disc disease)    spinal stenosis   Diabetes mellitus without complication (HCC)    04/2023 a1c 7.2%   Elevated liver enzymes    2021 after starting methotrexate. 2022->rheum d/c'd her methotrexate   Her abd u/s 12/05/20 showed fatty liver, o/w normal.   Fatty liver    11/2020 liver u/s   Gastritis    H pylori NEG on EGD 02/2021   GERD (gastroesophageal reflux disease)    History of adenomatous polyp of colon 06/2021   2022, recall 5 yrs   History of GI bleed    NSAIDS   Hyperlipidemia    mixed    Microscopic hematuria    full w/ u unrevealing X 2   Normal nuclear stress test 11/11 and 06/2014   negative, EF normal   Oxygen deficiency    Just At night   Palpitations 2006   48H holter neg   Positive occult stool blood test    iFOB positive 04/2021->colonoscopy showed multiple adenomatous polyps and nonbleeding int hem.   RBBB (right bundle branch block)    Recurrent UTI    Seronegative rheumatoid arthritis (HCC) 10/2019   multiple sites->pred started, methotrex started 10/2019; rheum to add humira as of 03/2020   Tobacco dependence in remission    Quit fall 2015.      Past Surgical History:  Procedure Laterality Date   ABDOMINAL HYSTERECTOMY  1997   DUB   APPENDECTOMY  1984   BIOPSY  02/14/2021   Procedure: BIOPSY;  Surgeon: Lanelle Bal, DO;  Location: [facility];  Service: Endoscopy;;  CARDIAC CATHETERIZATION  10/09/2005   no CAD, mildly elevated LVEDP, normal LV function (Dr. Laurell Josephs)   CARDIAC CATHETERIZATION N/A 05/16/2015   Mild non-obstructive CAD--med mgmt.  Procedure: Left Heart Cath and Coronary Angiography;  Surgeon: Chrystie Nose, MD;  Location: [facility] INVASIVE CV LAB;  Service: Cardiovascular;  Laterality: N/A;   CARDIOVASCULAR STRESS TEST  06/2014   normal lexiscan NST   CARDIOVASCULAR STRESS TEST  2006   persantine - no ischemia, low risk    COLONOSCOPY W/ POLYPECTOMY  06/12/2021   multiple adenomas--recall 3-5 yrs   COLONOSCOPY WITH PROPOFOL N/A 06/12/2021   Procedure: COLONOSCOPY WITH PROPOFOL;  Surgeon: Lanelle Bal, DO;  Location: [facility];  Service: Endoscopy;  Laterality: N/A;  8:30am   ESOPHAGOGASTRODUODENOSCOPY  02/14/2021   Gastritis ( H pylori NEG) w/out bleeding   ESOPHAGOGASTRODUODENOSCOPY (EGD) WITH PROPOFOL N/A 02/14/2021   Procedure: ESOPHAGOGASTRODUODENOSCOPY (EGD) WITH PROPOFOL;  Surgeon: Lanelle Bal, DO;  Location: [facility];  Service: Endoscopy;  Laterality: N/A;  AM   JOINT REPLACEMENT  11-05-2017   Partial  right knee replacement   KNEE ARTHROSCOPY Bilateral    left wrist ganglion cyst excision  2010   LUMBAR SPINE SURGERY  1993   right iliac crest bone graft+metal instrumentation; 2005 metal removal and decompression, 2011 lumbar decompression 4-11, then stabilization/ instrumentation done 09-19-10: L2,L3, L5 left and L2 , L3, L4 right pedical remnant L4 left embedded. Left iliac crest bone graft-- Dr Lily Lovings SPINE SURGERY  02/10/2016   Dr. Clotilde Dieter: lumbar decompression, instrumentation removal, and fusion exploration--HELPED her a lot, esp her radicular leg pains.   OOPHORECTOMY Right    cyst   PARTIAL KNEE ARTHROPLASTY Right 11/04/2018   Procedure: UNICOMPARTMENTAL KNEE;  Surgeon: Sheral Apley, MD;  Location: [facility];  Service: Orthopedics;  Laterality: Right;  Adductor Block   POLYPECTOMY  06/12/2021   Procedure: POLYPECTOMY;  Surgeon: Lanelle Bal, DO;  Location: [facility];  Service: Endoscopy;;   SPINE SURGERY  05-24-1991   Dr Clotilde Dieter surgeon [facility]. 2 level spinal fusion. 09-22-1998, 06-21-2004 took plates and screws out also got bone off of nerves. 2012 took out plates and screws and got bones off of nerves. 2017 2 level spinal fusion and scraped bones away from nerves.   TONSILLECTOMY  67 yrs old   TRANSTHORACIC ECHOCARDIOGRAM  2006   EF=>55%, trace MR, mild TR, trace AV regurg, trace pulm valve regurg    TUBAL LIGATION      MEDICATIONS:  acetaminophen (TYLENOL) 500 MG tablet   Adalimumab (HUMIRA) 40 MG/0.8ML PSKT   albuterol (VENTOLIN HFA) 108 (90 Base) MCG/ACT inhaler   clopidogrel (PLAVIX) 75 MG tablet   cyclobenzaprine (FLEXERIL) 10 MG tablet   fenofibrate 160 MG tablet   folic acid (FOLVITE) 1 MG tablet   lisinopril (ZESTRIL) 5 MG tablet   LORazepam (ATIVAN) 1 MG tablet   metFORMIN (GLUCOPHAGE) 500 MG tablet   nitroGLYCERIN (NITROSTAT) 0.4 MG SL tablet   oxyCODONE (OXY IR/ROXICODONE) 5 MG immediate release tablet   OXYGEN   pantoprazole (PROTONIX) 40  MG tablet   rosuvastatin (CRESTOR) 40 MG tablet   Tiotropium Bromide-Olodaterol (STIOLTO RESPIMAT) 2.5-2.5 MCG/ACT AERS   No current facility-administered medications for this encounter.   Marcille Blanco [facility] Surgical Short Stay/Anesthesiology [facility] Phone [phone] 08/23/2023 10:06 AM

## 2023-08-23 NOTE — Care Plan (Signed)
Ortho Bundle Case Management Note  Patient Details  Name: Brenda [name] MRN: [id number] Date of Birth: [birth year]  met with patient and husband in the office for H&P. will discharge to home with family assistance. rolling walker and CPM ordered for home. they do not want HHPT. OPPT set up with [facility] [place]. discharge instructions discussed and questions answered. Patient and MD in agreement. Choice offered                     DME Arranged:  CPM DME Agency:  [employer]  HH Arranged:    HH Agency:     Additional Comments: Please contact me with any questions of if this plan should need to change.  Shauna Hugh,  RN,BSN,MHA,CCM  [facility]  [phone] 08/23/2023, 3:41 PM

## 2023-08-23 NOTE — Plan of Care (Signed)
CHL Tonsillectomy/Adenoidectomy, Postoperative PEDS care plan entered in error.

## 2023-08-30 ENCOUNTER — Other Ambulatory Visit: Payer: Self-pay | Admitting: Family Medicine

## 2023-08-30 MED ORDER — OXYCODONE HCL 5 MG PO TABS: ORAL_TABLET | ORAL | 0 refills | Status: DC

## 2023-08-30 NOTE — Telephone Encounter (Signed)
Requesting: oxyCODONE (OXY IR/ROXICODONE) 5 MG immediate release tablet  Contract: 01/16/22 UDS: 08/01/23 Last Visit: 08/01/23 Next Visit: 10/31/23 Last Refill: 08/01/23 (180,0)  Please Advise. Med pending

## 2023-09-03 ENCOUNTER — Ambulatory Visit (HOSPITAL_BASED_OUTPATIENT_CLINIC_OR_DEPARTMENT_OTHER): Payer: Medicare PPO | Admitting: Certified Registered"

## 2023-09-03 ENCOUNTER — Encounter (HOSPITAL_COMMUNITY)
Admission: RE | Disposition: A | Discharge status: Home / Self Care | Payer: Self-pay | Source: Ambulatory Visit | Attending: Orthopedic Surgery

## 2023-09-03 ENCOUNTER — Other Ambulatory Visit: Payer: Self-pay

## 2023-09-03 ENCOUNTER — Observation Stay (HOSPITAL_COMMUNITY)
Admission: RE | Admit: 2023-09-03 | Discharge: 2023-09-04 | Disposition: A | Discharge status: Home / Self Care | Payer: Medicare PPO | Source: Ambulatory Visit | Attending: Orthopedic Surgery | Admitting: Orthopedic Surgery

## 2023-09-03 ENCOUNTER — Ambulatory Visit (HOSPITAL_COMMUNITY): Payer: Medicare PPO | Admitting: Medical

## 2023-09-03 ENCOUNTER — Encounter (HOSPITAL_COMMUNITY): Payer: Self-pay | Admitting: Orthopedic Surgery

## 2023-09-03 ENCOUNTER — Observation Stay (HOSPITAL_COMMUNITY): Payer: Medicare PPO

## 2023-09-03 DIAGNOSIS — J449 Chronic obstructive pulmonary disease, unspecified: Secondary | ICD-10-CM | POA: Insufficient documentation

## 2023-09-03 DIAGNOSIS — Z8616 Personal history of COVID-19: Secondary | ICD-10-CM | POA: Diagnosis not present

## 2023-09-03 DIAGNOSIS — M1712 Unilateral primary osteoarthritis, left knee: Principal | ICD-10-CM | POA: Insufficient documentation

## 2023-09-03 DIAGNOSIS — Z96651 Presence of right artificial knee joint: Secondary | ICD-10-CM | POA: Insufficient documentation

## 2023-09-03 DIAGNOSIS — Z7984 Long term (current) use of oral hypoglycemic drugs: Secondary | ICD-10-CM | POA: Diagnosis not present

## 2023-09-03 DIAGNOSIS — G8918 Other acute postprocedural pain: Secondary | ICD-10-CM | POA: Diagnosis not present

## 2023-09-03 DIAGNOSIS — Z87891 Personal history of nicotine dependence: Secondary | ICD-10-CM | POA: Insufficient documentation

## 2023-09-03 DIAGNOSIS — I2510 Atherosclerotic heart disease of native coronary artery without angina pectoris: Secondary | ICD-10-CM | POA: Diagnosis not present

## 2023-09-03 DIAGNOSIS — Z79899 Other long term (current) drug therapy: Secondary | ICD-10-CM | POA: Insufficient documentation

## 2023-09-03 DIAGNOSIS — Z96652 Presence of left artificial knee joint: Secondary | ICD-10-CM | POA: Diagnosis not present

## 2023-09-03 DIAGNOSIS — Z7902 Long term (current) use of antithrombotics/antiplatelets: Secondary | ICD-10-CM | POA: Diagnosis not present

## 2023-09-03 DIAGNOSIS — E119 Type 2 diabetes mellitus without complications: Secondary | ICD-10-CM | POA: Diagnosis not present

## 2023-09-03 DIAGNOSIS — Z471 Aftercare following joint replacement surgery: Secondary | ICD-10-CM | POA: Diagnosis not present

## 2023-09-03 HISTORY — PX: TOTAL KNEE ARTHROPLASTY: SHX125

## 2023-09-03 LAB — GLUCOSE, CAPILLARY
Glucose-Capillary: 128 mg/dL — ABNORMAL HIGH (ref 70–99)
Glucose-Capillary: 174 mg/dL — ABNORMAL HIGH (ref 70–99)

## 2023-09-03 SURGERY — ARTHROPLASTY, KNEE, TOTAL
Anesthesia: General | Site: Knee | Laterality: Left

## 2023-09-03 MED ORDER — METOCLOPRAMIDE HCL 5 MG/ML IJ SOLN: 5.0000 mg | Freq: Three times a day (TID) | INTRAMUSCULAR | Status: DC | PRN

## 2023-09-03 MED ORDER — OXYCODONE HCL 5 MG PO TABS
10.0000 mg | ORAL_TABLET | ORAL | Status: DC | PRN
Start: 2023-09-03 — End: 2023-09-04
  Administered 2023-09-04: 10 mg via ORAL

## 2023-09-03 MED ORDER — MAGNESIUM CITRATE PO SOLN: 1.0000 | Freq: Once | ORAL | Status: DC | PRN

## 2023-09-03 MED ORDER — SODIUM CHLORIDE (PF) 0.9 % IJ SOLN
INTRAMUSCULAR | Status: AC
  Filled 2023-09-03: qty 30

## 2023-09-03 MED ORDER — ACETAMINOPHEN 500 MG PO TABS
1000.0000 mg | ORAL_TABLET | Freq: Four times a day (QID) | ORAL | Status: AC
  Administered 2023-09-03 – 2023-09-04 (×4): 1000 mg via ORAL
  Filled 2023-09-03 (×4): qty 2

## 2023-09-03 MED ORDER — PROPOFOL 10 MG/ML IV BOLUS
INTRAVENOUS | Status: AC
  Filled 2023-09-03: qty 20

## 2023-09-03 MED ORDER — SUGAMMADEX SODIUM 200 MG/2ML IV SOLN
INTRAVENOUS | Status: DC | PRN
  Administered 2023-09-03: 200 mg via INTRAVENOUS

## 2023-09-03 MED ORDER — LIDOCAINE HCL (PF) 2 % IJ SOLN
INTRAMUSCULAR | Status: AC
  Filled 2023-09-03: qty 5

## 2023-09-03 MED ORDER — CHLORHEXIDINE GLUCONATE 0.12 % MT SOLN
15.0000 mL | Freq: Once | OROMUCOSAL | Status: AC
  Administered 2023-09-03: 15 mL via OROMUCOSAL

## 2023-09-03 MED ORDER — FENOFIBRATE 160 MG PO TABS
160.0000 mg | ORAL_TABLET | Freq: Every day | ORAL | Status: DC
  Administered 2023-09-04: 160 mg via ORAL
  Filled 2023-09-03: qty 1

## 2023-09-03 MED ORDER — ONDANSETRON HCL 4 MG PO TABS: 4.0000 mg | ORAL_TABLET | Freq: Four times a day (QID) | ORAL | Status: DC | PRN

## 2023-09-03 MED ORDER — ACETAMINOPHEN 500 MG PO TABS
1000.0000 mg | ORAL_TABLET | Freq: Once | ORAL | Status: AC
  Administered 2023-09-03: 1000 mg via ORAL
  Filled 2023-09-03: qty 2

## 2023-09-03 MED ORDER — FENTANYL CITRATE (PF) 100 MCG/2ML IJ SOLN
INTRAMUSCULAR | Status: AC
  Filled 2023-09-03: qty 2

## 2023-09-03 MED ORDER — ACETAMINOPHEN 500 MG PO TABS: 1000.0000 mg | ORAL_TABLET | Freq: Once | ORAL | Status: DC

## 2023-09-03 MED ORDER — PROPOFOL 10 MG/ML IV BOLUS
INTRAVENOUS | Status: DC | PRN
  Administered 2023-09-03: 120 mg via INTRAVENOUS

## 2023-09-03 MED ORDER — FENTANYL CITRATE PF 50 MCG/ML IJ SOSY: 50.0000 ug | PREFILLED_SYRINGE | INTRAMUSCULAR | Status: AC

## 2023-09-03 MED ORDER — ROCURONIUM BROMIDE 100 MG/10ML IV SOLN
INTRAVENOUS | Status: DC | PRN
  Administered 2023-09-03: 60 mg via INTRAVENOUS
  Administered 2023-09-03: 20 mg via INTRAVENOUS

## 2023-09-03 MED ORDER — DROPERIDOL 2.5 MG/ML IJ SOLN: 0.6250 mg | Freq: Once | INTRAMUSCULAR | Status: DC | PRN

## 2023-09-03 MED ORDER — BUPIVACAINE LIPOSOME 1.3 % IJ SUSP: 20.0000 mL | Freq: Once | INTRAMUSCULAR | Status: DC

## 2023-09-03 MED ORDER — DIPHENHYDRAMINE HCL 50 MG/ML IJ SOLN
12.5000 mg | Freq: Once | INTRAMUSCULAR | Status: AC
  Administered 2023-09-03: 12.5 mg via INTRAVENOUS

## 2023-09-03 MED ORDER — TRAMADOL HCL 50 MG PO TABS
50.0000 mg | ORAL_TABLET | Freq: Four times a day (QID) | ORAL | Status: DC
Start: 2023-09-03 — End: 2023-09-04
  Administered 2023-09-03 – 2023-09-04 (×4): 50 mg via ORAL
  Filled 2023-09-03 (×4): qty 1

## 2023-09-03 MED ORDER — TRANEXAMIC ACID-NACL 1000-0.7 MG/100ML-% IV SOLN
1000.0000 mg | INTRAVENOUS | Status: AC
  Administered 2023-09-03: 1000 mg via INTRAVENOUS
  Filled 2023-09-03: qty 100

## 2023-09-03 MED ORDER — MENTHOL 3 MG MT LOZG: 1.0000 | LOZENGE | OROMUCOSAL | Status: DC | PRN

## 2023-09-03 MED ORDER — ONDANSETRON HCL 4 MG/2ML IJ SOLN
INTRAMUSCULAR | Status: AC
  Filled 2023-09-03: qty 2

## 2023-09-03 MED ORDER — HYDROMORPHONE HCL 1 MG/ML IJ SOLN
INTRAMUSCULAR | Status: AC
  Administered 2023-09-03: 0.5 mg via INTRAVENOUS
  Filled 2023-09-03: qty 1

## 2023-09-03 MED ORDER — CLONIDINE HCL (ANALGESIA) 100 MCG/ML EP SOLN
EPIDURAL | Status: DC | PRN
  Administered 2023-09-03: 80 ug

## 2023-09-03 MED ORDER — ROSUVASTATIN CALCIUM 20 MG PO TABS
40.0000 mg | ORAL_TABLET | Freq: Every day | ORAL | Status: DC
  Administered 2023-09-04: 40 mg via ORAL
  Filled 2023-09-03: qty 2

## 2023-09-03 MED ORDER — OXYCODONE HCL 5 MG/5ML PO SOLN: 5.0000 mg | Freq: Once | ORAL | Status: DC | PRN

## 2023-09-03 MED ORDER — SODIUM CHLORIDE (PF) 0.9 % IJ SOLN
INTRAMUSCULAR | Status: DC | PRN
Start: 2023-09-03 — End: 2023-09-03
  Administered 2023-09-03: 30 mL

## 2023-09-03 MED ORDER — UMECLIDINIUM BROMIDE 62.5 MCG/ACT IN AEPB
1.0000 | INHALATION_SPRAY | Freq: Every day | RESPIRATORY_TRACT | Status: DC
  Administered 2023-09-04: 1 via RESPIRATORY_TRACT
  Filled 2023-09-03: qty 7

## 2023-09-03 MED ORDER — 0.9 % SODIUM CHLORIDE (POUR BTL) OPTIME
TOPICAL | Status: DC | PRN
  Administered 2023-09-03: 1000 mL

## 2023-09-03 MED ORDER — POVIDONE-IODINE 10 % EX SWAB: 2.0000 | Freq: Once | CUTANEOUS | Status: DC

## 2023-09-03 MED ORDER — HYDROMORPHONE HCL 1 MG/ML IJ SOLN
0.5000 mg | INTRAMUSCULAR | Status: DC | PRN
  Administered 2023-09-03: 1 mg via INTRAVENOUS
  Filled 2023-09-03: qty 1

## 2023-09-03 MED ORDER — LORAZEPAM 1 MG PO TABS: 1.0000 mg | ORAL_TABLET | Freq: Two times a day (BID) | ORAL | Status: DC | PRN

## 2023-09-03 MED ORDER — KETAMINE HCL 50 MG/5ML IJ SOSY
PREFILLED_SYRINGE | INTRAMUSCULAR | Status: AC
  Filled 2023-09-03: qty 5

## 2023-09-03 MED ORDER — METHOCARBAMOL 1000 MG/10ML IJ SOLN
500.0000 mg | Freq: Four times a day (QID) | INTRAMUSCULAR | Status: DC | PRN
  Administered 2023-09-03: 500 mg via INTRAVENOUS
  Filled 2023-09-03: qty 10

## 2023-09-03 MED ORDER — PHENOL 1.4 % MT LIQD: 1.0000 | OROMUCOSAL | Status: DC | PRN

## 2023-09-03 MED ORDER — MIDAZOLAM HCL 2 MG/2ML IJ SOLN: 1.0000 mg | INTRAMUSCULAR | Status: AC

## 2023-09-03 MED ORDER — BUPIVACAINE LIPOSOME 1.3 % IJ SUSP
INTRAMUSCULAR | Status: DC | PRN
  Administered 2023-09-03: 20 mL

## 2023-09-03 MED ORDER — WATER FOR IRRIGATION, STERILE IR SOLN
Status: DC | PRN
  Administered 2023-09-03: 2000 mL

## 2023-09-03 MED ORDER — DEXAMETHASONE SODIUM PHOSPHATE 10 MG/ML IJ SOLN
INTRAMUSCULAR | Status: AC
  Filled 2023-09-03: qty 1

## 2023-09-03 MED ORDER — BUPIVACAINE LIPOSOME 1.3 % IJ SUSP
INTRAMUSCULAR | Status: AC
  Filled 2023-09-03: qty 20

## 2023-09-03 MED ORDER — CEFAZOLIN SODIUM-DEXTROSE 2-4 GM/100ML-% IV SOLN
2.0000 g | Freq: Once | INTRAVENOUS | Status: AC
  Administered 2023-09-04: 2 g via INTRAVENOUS
  Filled 2023-09-03: qty 100

## 2023-09-03 MED ORDER — FENTANYL CITRATE (PF) 100 MCG/2ML IJ SOLN
INTRAMUSCULAR | Status: DC | PRN
  Administered 2023-09-03 (×4): 50 ug via INTRAVENOUS
  Administered 2023-09-03 (×2): 25 ug via INTRAVENOUS

## 2023-09-03 MED ORDER — MEPERIDINE HCL 50 MG/ML IJ SOLN
INTRAMUSCULAR | Status: AC
  Filled 2023-09-03: qty 1

## 2023-09-03 MED ORDER — ALBUTEROL SULFATE (2.5 MG/3ML) 0.083% IN NEBU: 3.0000 mL | INHALATION_SOLUTION | RESPIRATORY_TRACT | Status: DC | PRN

## 2023-09-03 MED ORDER — ROCURONIUM BROMIDE 10 MG/ML (PF) SYRINGE
PREFILLED_SYRINGE | INTRAVENOUS | Status: AC
  Filled 2023-09-03: qty 10

## 2023-09-03 MED ORDER — CEFAZOLIN SODIUM-DEXTROSE 2-4 GM/100ML-% IV SOLN
2.0000 g | Freq: Four times a day (QID) | INTRAVENOUS | Status: AC
  Administered 2023-09-03 – 2023-09-04 (×2): 2 g via INTRAVENOUS
  Filled 2023-09-03 (×2): qty 100

## 2023-09-03 MED ORDER — CEFAZOLIN SODIUM-DEXTROSE 2-4 GM/100ML-% IV SOLN
2.0000 g | INTRAVENOUS | Status: AC
  Administered 2023-09-03: 2 g via INTRAVENOUS
  Filled 2023-09-03: qty 100

## 2023-09-03 MED ORDER — ACETAMINOPHEN 325 MG PO TABS: 325.0000 mg | ORAL_TABLET | Freq: Four times a day (QID) | ORAL | Status: DC | PRN

## 2023-09-03 MED ORDER — DOCUSATE SODIUM 100 MG PO CAPS
100.0000 mg | ORAL_CAPSULE | Freq: Two times a day (BID) | ORAL | Status: DC
  Administered 2023-09-03 – 2023-09-04 (×2): 100 mg via ORAL
  Filled 2023-09-03 (×2): qty 1

## 2023-09-03 MED ORDER — ROPIVACAINE HCL 5 MG/ML IJ SOLN
INTRAMUSCULAR | Status: DC | PRN
  Administered 2023-09-03: 30 mL via PERINEURAL

## 2023-09-03 MED ORDER — LACTATED RINGERS IV SOLN: INTRAVENOUS | Status: AC

## 2023-09-03 MED ORDER — DIPHENHYDRAMINE HCL 50 MG/ML IJ SOLN
INTRAMUSCULAR | Status: AC
  Filled 2023-09-03: qty 1

## 2023-09-03 MED ORDER — LACTATED RINGERS IV SOLN: INTRAVENOUS | Status: DC

## 2023-09-03 MED ORDER — METHOCARBAMOL 500 MG PO TABS
500.0000 mg | ORAL_TABLET | Freq: Four times a day (QID) | ORAL | Status: DC | PRN
  Administered 2023-09-04 (×2): 500 mg via ORAL
  Filled 2023-09-03 (×2): qty 1

## 2023-09-03 MED ORDER — DEXAMETHASONE SODIUM PHOSPHATE 10 MG/ML IJ SOLN
10.0000 mg | Freq: Once | INTRAMUSCULAR | Status: AC
  Administered 2023-09-04: 10 mg via INTRAVENOUS
  Filled 2023-09-03: qty 1

## 2023-09-03 MED ORDER — INSULIN ASPART 100 UNIT/ML IJ SOLN: 0.0000 [IU] | INTRAMUSCULAR | Status: DC | PRN

## 2023-09-03 MED ORDER — LISINOPRIL 5 MG PO TABS
5.0000 mg | ORAL_TABLET | Freq: Every day | ORAL | Status: DC
  Administered 2023-09-03: 5 mg via ORAL
  Filled 2023-09-03 (×2): qty 1

## 2023-09-03 MED ORDER — HYDROMORPHONE HCL 1 MG/ML IJ SOLN
0.2500 mg | INTRAMUSCULAR | Status: DC | PRN
  Administered 2023-09-03: 0.5 mg via INTRAVENOUS

## 2023-09-03 MED ORDER — DIPHENHYDRAMINE HCL 12.5 MG/5ML PO ELIX: 12.5000 mg | ORAL_SOLUTION | ORAL | Status: DC | PRN

## 2023-09-03 MED ORDER — METFORMIN HCL 500 MG PO TABS
500.0000 mg | ORAL_TABLET | Freq: Two times a day (BID) | ORAL | Status: DC
  Administered 2023-09-04: 500 mg via ORAL
  Filled 2023-09-03: qty 1

## 2023-09-03 MED ORDER — ALUM & MAG HYDROXIDE-SIMETH 200-200-20 MG/5ML PO SUSP: 30.0000 mL | ORAL | Status: DC | PRN

## 2023-09-03 MED ORDER — CLOPIDOGREL BISULFATE 75 MG PO TABS
75.0000 mg | ORAL_TABLET | Freq: Every day | ORAL | Status: DC
  Administered 2023-09-04: 75 mg via ORAL
  Filled 2023-09-03: qty 1

## 2023-09-03 MED ORDER — FENTANYL CITRATE PF 50 MCG/ML IJ SOSY
PREFILLED_SYRINGE | INTRAMUSCULAR | Status: AC
  Administered 2023-09-03: 50 ug via INTRAVENOUS
  Filled 2023-09-03: qty 2

## 2023-09-03 MED ORDER — LIDOCAINE HCL (CARDIAC) PF 100 MG/5ML IV SOSY
PREFILLED_SYRINGE | INTRAVENOUS | Status: DC | PRN
  Administered 2023-09-03: 80 mg via INTRAVENOUS

## 2023-09-03 MED ORDER — ONDANSETRON HCL 4 MG/2ML IJ SOLN
INTRAMUSCULAR | Status: DC | PRN
  Administered 2023-09-03: 4 mg via INTRAVENOUS

## 2023-09-03 MED ORDER — BUPIVACAINE-EPINEPHRINE 0.25% -1:200000 IJ SOLN
INTRAMUSCULAR | Status: DC | PRN
  Administered 2023-09-03: 30 mL

## 2023-09-03 MED ORDER — METOCLOPRAMIDE HCL 5 MG PO TABS: 5.0000 mg | ORAL_TABLET | Freq: Three times a day (TID) | ORAL | Status: DC | PRN

## 2023-09-03 MED ORDER — NITROGLYCERIN 0.4 MG SL SUBL: 0.4000 mg | SUBLINGUAL_TABLET | SUBLINGUAL | Status: DC | PRN

## 2023-09-03 MED ORDER — MEPERIDINE HCL 50 MG/ML IJ SOLN
6.2500 mg | INTRAMUSCULAR | Status: DC | PRN
  Administered 2023-09-03: 12.5 mg via INTRAVENOUS

## 2023-09-03 MED ORDER — PANTOPRAZOLE SODIUM 40 MG PO TBEC
40.0000 mg | DELAYED_RELEASE_TABLET | Freq: Every day | ORAL | Status: DC
  Administered 2023-09-04: 40 mg via ORAL
  Filled 2023-09-03: qty 1

## 2023-09-03 MED ORDER — OXYCODONE HCL 5 MG PO TABS
5.0000 mg | ORAL_TABLET | ORAL | Status: DC | PRN
  Administered 2023-09-03 – 2023-09-04 (×2): 10 mg via ORAL
  Filled 2023-09-03 (×3): qty 2

## 2023-09-03 MED ORDER — BUPIVACAINE-EPINEPHRINE 0.25% -1:200000 IJ SOLN
INTRAMUSCULAR | Status: AC
  Filled 2023-09-03: qty 1

## 2023-09-03 MED ORDER — DEXAMETHASONE SODIUM PHOSPHATE 10 MG/ML IJ SOLN
8.0000 mg | Freq: Once | INTRAMUSCULAR | Status: AC
  Administered 2023-09-03: 8 mg via INTRAVENOUS

## 2023-09-03 MED ORDER — OXYCODONE HCL 5 MG PO TABS: 5.0000 mg | ORAL_TABLET | Freq: Once | ORAL | Status: DC | PRN

## 2023-09-03 MED ORDER — BISACODYL 10 MG RE SUPP: 10.0000 mg | Freq: Every day | RECTAL | Status: DC | PRN

## 2023-09-03 MED ORDER — KETAMINE HCL 10 MG/ML IJ SOLN
INTRAMUSCULAR | Status: DC | PRN
  Administered 2023-09-03: 5 mg via INTRAVENOUS
  Administered 2023-09-03: 25 mg via INTRAVENOUS

## 2023-09-03 MED ORDER — POLYETHYLENE GLYCOL 3350 17 G PO PACK: 17.0000 g | PACK | Freq: Every day | ORAL | Status: DC | PRN

## 2023-09-03 MED ORDER — DEXAMETHASONE SODIUM PHOSPHATE 4 MG/ML IJ SOLN
INTRAMUSCULAR | Status: DC | PRN
  Administered 2023-09-03: 5 mg via PERINEURAL

## 2023-09-03 MED ORDER — FOLIC ACID 1 MG PO TABS
1.0000 mg | ORAL_TABLET | Freq: Every day | ORAL | Status: DC
  Administered 2023-09-04: 1 mg via ORAL
  Filled 2023-09-03: qty 1

## 2023-09-03 MED ORDER — ARFORMOTEROL TARTRATE 15 MCG/2ML IN NEBU
15.0000 ug | INHALATION_SOLUTION | Freq: Two times a day (BID) | RESPIRATORY_TRACT | Status: DC
  Administered 2023-09-04: 15 ug via RESPIRATORY_TRACT
  Filled 2023-09-03 (×2): qty 2

## 2023-09-03 MED ORDER — MIDAZOLAM HCL 2 MG/2ML IJ SOLN
INTRAMUSCULAR | Status: AC
  Administered 2023-09-03: 2 mg via INTRAVENOUS
  Filled 2023-09-03: qty 2

## 2023-09-03 MED ORDER — ORAL CARE MOUTH RINSE: 15.0000 mL | Freq: Once | OROMUCOSAL | Status: AC

## 2023-09-03 MED ORDER — TRANEXAMIC ACID-NACL 1000-0.7 MG/100ML-% IV SOLN
1000.0000 mg | Freq: Once | INTRAVENOUS | Status: AC
  Administered 2023-09-03: 1000 mg via INTRAVENOUS
  Filled 2023-09-03: qty 100

## 2023-09-03 MED ORDER — SODIUM CHLORIDE 0.9 % IR SOLN
Status: DC | PRN
  Administered 2023-09-03: 1000 mL

## 2023-09-03 MED ORDER — RIVAROXABAN 10 MG PO TABS
10.0000 mg | ORAL_TABLET | Freq: Every day | ORAL | Status: DC
  Administered 2023-09-04: 10 mg via ORAL
  Filled 2023-09-03: qty 1

## 2023-09-03 MED ORDER — ONDANSETRON HCL 4 MG/2ML IJ SOLN
4.0000 mg | Freq: Four times a day (QID) | INTRAMUSCULAR | Status: DC | PRN
  Administered 2023-09-03: 4 mg via INTRAVENOUS
  Filled 2023-09-03: qty 2

## 2023-09-03 SURGICAL SUPPLY — 52 items
BAG COUNTER SPONGE SURGICOUNT (BAG) IMPLANT
BAG SPNG CNTER NS LX DISP (BAG)
BLADE SAG 18X100X1.27 (BLADE) ×1 IMPLANT
BLADE SAGITTAL 25.0X1.37X90 (BLADE) ×1 IMPLANT
BLADE SURG 15 STRL LF DISP TIS (BLADE) ×1 IMPLANT
BLADE SURG 15 STRL SS (BLADE) ×1
BNDG CMPR MED 10X6 ELC LF (GAUZE/BANDAGES/DRESSINGS) ×1
BNDG ELASTIC 6X10 VLCR STRL LF (GAUZE/BANDAGES/DRESSINGS) ×1 IMPLANT
BOWL SMART MIX CTS (DISPOSABLE) IMPLANT
BSPLAT TIB 3 KN TRITANIUM (Knees) ×1 IMPLANT
CLSR STERI-STRIP ANTIMIC 1/2X4 (GAUZE/BANDAGES/DRESSINGS) ×2 IMPLANT
COMP FEMORAL TRIATHLON SZ3 (Joint) ×1 IMPLANT
COMPONENT FEMRL TRIATHLON SZ3 (Joint) IMPLANT
COVER SURGICAL LIGHT HANDLE (MISCELLANEOUS) ×1 IMPLANT
CUFF TOURN SGL QUICK 34 (TOURNIQUET CUFF) ×1
CUFF TRNQT CYL 34X4.125X (TOURNIQUET CUFF) ×1 IMPLANT
DRAPE U-SHAPE 47X51 STRL (DRAPES) ×1 IMPLANT
DRSG MEPILEX POST OP 4X12 (GAUZE/BANDAGES/DRESSINGS) ×1 IMPLANT
DURAPREP 26ML APPLICATOR (WOUND CARE) ×2 IMPLANT
ELECT REM PT RETURN 15FT ADLT (MISCELLANEOUS) ×1 IMPLANT
GLOVE BIO SURGEON STRL SZ7.5 (GLOVE) ×2 IMPLANT
GLOVE BIOGEL PI IND STRL 7.5 (GLOVE) ×1 IMPLANT
GLOVE BIOGEL PI IND STRL 8 (GLOVE) ×1 IMPLANT
GLOVE SURG SYN 7.5 E (GLOVE) ×1 IMPLANT
GLOVE SURG SYN 7.5 PF PI (GLOVE) ×1 IMPLANT
GOWN STRL REUS W/ TWL LRG LVL3 (GOWN DISPOSABLE) ×1 IMPLANT
GOWN STRL REUS W/ TWL XL LVL3 (GOWN DISPOSABLE) ×1 IMPLANT
GOWN STRL REUS W/TWL LRG LVL3 (GOWN DISPOSABLE) ×1
GOWN STRL REUS W/TWL XL LVL3 (GOWN DISPOSABLE) ×1
HANDPIECE INTERPULSE COAX TIP (DISPOSABLE) ×1
HOLDER FOLEY CATH W/STRAP (MISCELLANEOUS) IMPLANT
IMMOBILIZER KNEE 22 UNIV (SOFTGOODS) IMPLANT
INSERT TIB CS TRIATH X3 9 (Insert) IMPLANT
KIT TURNOVER KIT A (KITS) IMPLANT
KNEE PATELLA ASYMMETRIC 9X29 (Knees) IMPLANT
KNEE TIBIAL COMPONENT SZ3 (Knees) IMPLANT
MANIFOLD NEPTUNE II (INSTRUMENTS) ×1 IMPLANT
NS IRRIG 1000ML POUR BTL (IV SOLUTION) ×1 IMPLANT
PACK TOTAL KNEE CUSTOM (KITS) ×1 IMPLANT
PIN FLUTED HEDLESS FIX 3.5X1/8 (PIN) IMPLANT
PROTECTOR NERVE ULNAR (MISCELLANEOUS) ×1 IMPLANT
SET HNDPC FAN SPRY TIP SCT (DISPOSABLE) ×1 IMPLANT
SPIKE FLUID TRANSFER (MISCELLANEOUS) ×1 IMPLANT
SUT MNCRL AB 3-0 PS2 18 (SUTURE) ×1 IMPLANT
SUT VIC AB 0 CT1 36 (SUTURE) ×1 IMPLANT
SUT VIC AB 1 CT1 36 (SUTURE) ×1 IMPLANT
SUT VIC AB 2-0 CT1 27 (SUTURE) ×1
SUT VIC AB 2-0 CT1 TAPERPNT 27 (SUTURE) ×1 IMPLANT
TOWEL GREEN STERILE FF (TOWEL DISPOSABLE) ×1 IMPLANT
TRAY FOLEY MTR SLVR 16FR STAT (SET/KITS/TRAYS/PACK) IMPLANT
TUBE SUCTION HIGH CAP CLEAR NV (SUCTIONS) ×1 IMPLANT
WRAP KNEE MAXI GEL POST OP (GAUZE/BANDAGES/DRESSINGS) ×1 IMPLANT

## 2023-09-03 NOTE — Progress Notes (Signed)
Orthopedic Tech Progress Note Patient Details:  Brenda [name] [birth year] [id number] CPM will be removed at 3:40 pm.  CPM Left Knee CPM Left Knee: On Left Knee Flexion (Degrees): 60 Left Knee Extension (Degrees): 0  Post Interventions Patient Tolerated: Well Ortho Devices Type of Ortho Device: Bone foam zero knee Ortho Device/Splint Location: Left knee Ortho Device/Splint Interventions: Application   Post Interventions Patient Tolerated: Well  Brenda [name] 09/03/2023, 11:56 AM

## 2023-09-03 NOTE — Anesthesia Procedure Notes (Signed)
Procedure Name: Intubation Date/Time: 09/03/2023 9:02 AM  Performed by: Sindy Guadeloupe, CRNAPre-anesthesia Checklist: Patient identified, Emergency Drugs available, Suction available, Patient being monitored and Timeout performed Patient Re-evaluated:Patient Re-evaluated prior to induction Oxygen Delivery Method: Circle system utilized Preoxygenation: Pre-oxygenation with 100% oxygen Induction Type: IV induction Ventilation: Mask ventilation without difficulty Laryngoscope Size: Mac and 4 Grade View: Grade I Tube type: Oral Tube size: 7.0 mm Number of attempts: 1 Airway Equipment and Method: Stylet Placement Confirmation: ETT inserted through vocal cords under direct vision, positive ETCO2 and breath sounds checked- equal and bilateral Secured at: 21 cm Tube secured with: Tape Dental Injury: Teeth and Oropharynx as per pre-operative assessment

## 2023-09-03 NOTE — Discharge Instructions (Addendum)
Information on my medicine - XARELTO (Rivaroxaban)  This medication education was reviewed with me or my healthcare representative as part of my discharge preparation.   Why was Xarelto prescribed for you? Xarelto was prescribed for you to reduce the risk of blood clots forming after orthopedic surgery. The medical term for these abnormal blood clots is venous thromboembolism (VTE).  What do you need to know about xarelto ? Take your Xarelto ONCE DAILY at the same time every day. You may take it either with or without food.  If you have difficulty swallowing the tablet whole, you may crush it and mix in applesauce just prior to taking your dose.  Take Xarelto exactly as prescribed by your doctor and DO NOT stop taking Xarelto without talking to the doctor who prescribed the medication.  Stopping without other VTE prevention medication to take the place of Xarelto may increase your risk of developing a clot.  After discharge, you should have regular check-up appointments with your healthcare provider that is prescribing your Xarelto.    What do you do if you miss a dose? If you miss a dose, take it as soon as you remember on the same day then continue your regularly scheduled once daily regimen the next day. Do not take two doses of Xarelto on the same day.   Important Safety Information A possible side effect of Xarelto is bleeding. You should call your healthcare provider right away if you experience any of the following: Bleeding from an injury or your nose that does not stop. Unusual colored urine (red or dark brown) or unusual colored stools (red or black). Unusual bruising for unknown reasons. A serious fall or if you hit your head (even if there is no bleeding).  Some medicines may interact with Xarelto and might increase your risk of bleeding while on Xarelto. To help avoid this, consult your healthcare provider or pharmacist prior to using any new prescription or  non-prescription medications, including herbals, vitamins, non-steroidal anti-inflammatory drugs (NSAIDs) and supplements.  This website has more information on Xarelto: [email or web address].br. POST-OPERATIVE OPIOID TAPER INSTRUCTIONS: It is important to wean off of your opioid medication as soon as possible. If you do not need pain medication after your surgery it is ok to stop day one. Opioids include: Codeine, Hydrocodone(Norco, Vicodin), Oxycodone(Percocet, oxycontin) and hydromorphone amongst others.  Long term and even short term use of opiods can cause: Increased pain response Dependence Constipation Depression Respiratory depression And more.  Withdrawal symptoms can include Flu like symptoms Nausea, vomiting And more Techniques to manage these symptoms Hydrate well Eat regular healthy meals Stay active Use relaxation techniques(deep breathing, meditating, yoga) Do Not substitute Alcohol to help with tapering If you have been on opioids for less than two weeks and do not have pain than it is ok to stop all together.  Plan to wean off of opioids This plan should start within one week post op of your joint replacement. Maintain the same interval or time between taking each dose and first decrease the dose.  Cut the total daily intake of opioids by one tablet each day Next start to increase the time between doses. The last dose that should be eliminated is the evening dose.

## 2023-09-03 NOTE — Evaluation (Signed)
Physical Therapy Evaluation Patient Details Name: Brenda [name] MRN: [id number] DOB: 1955-12-31 Today's Date: 09/03/2023  History of Present Illness  67 yo female presents to therapy s/p L TKA on 09/03/2023 due to failure of conservative measures. Pt PMH includes but is not limited to: angina, CAD, LBP s/p surgeries, COPD, DM II, GERD, HLD, RA, and R partial KA (2019).  Clinical Impression      Brenda [name] is a 67 y.o. female POD 0 s/p L TKA. Patient reports required some assist with mobility at baseline including lower body dressing, used rollator for household navigation and HHA from spouse in community. Patient is now limited by functional impairments (see PT problem list below) and requires max A for rolling side to side in bed and unable to safely progress with gait or transfer tasks due to ongoing elevated pain response and nausea.  PT initiated patient instruction in exercise to facilitate ROM and circulation to manage edema. Patient will benefit from continued skilled PT interventions to address impairments and progress towards PLOF. Acute PT will follow to progress mobility and stair training in preparation for safe discharge home with family assist and OPPT services.      If plan is discharge home, recommend the following: Two people to help with walking and/or transfers;Two people to help with bathing/dressing/bathroom;Assistance with cooking/housework;Assist for transportation;Help with stairs or ramp for entrance   Can travel by private vehicle        Equipment Recommendations None recommended by PT (pt reports RW and CPM delivered to home prior to surgery)  Recommendations for Other Services       Functional Status Assessment Patient has had a recent decline in their functional status and demonstrates the ability to make significant improvements in function in a reasonable and predictable amount of time.     Precautions / Restrictions Precautions Precautions:  Knee;Fall Restrictions Weight Bearing Restrictions: No      Mobility  Bed Mobility Overal bed mobility: Needs Assistance Bed Mobility: Rolling Rolling: Max assist, Used rails         General bed mobility comments: pt limited at time of eval due to pain and nausea and required max A to roll side to side, pt was able to scoot in supine to [facility], pt unable at time of eval to redirect pt for supine <> sit with family arriving and pt indicating no change in pain s/p dilaudid. pt concerned per use of purewick overnight and nurse tech and PT ensured proper placement with rolling tasks    Transfers                   General transfer comment: NT    Ambulation/Gait               General Gait Details: NT  Stairs            Wheelchair Mobility     Tilt Bed    Modified Rankin (Stroke Patients Only)       Balance                                             Pertinent Vitals/Pain Pain Assessment Pain Assessment: 0-10 Pain Score: 10-Worst pain ever Pain Location: L knee and back Pain Descriptors / Indicators: Constant, Aching, Crying, Discomfort, Grimacing, Moaning, Penetrating, Operative site guarding Pain Intervention(s): Limited activity within patient's tolerance, Monitored  during session, Premedicated before session, Repositioned, Patient requesting pain meds-RN notified, RN gave pain meds during session    Home Living Family/patient expects to be discharged to:: Private residence Living Arrangements: Spouse/significant other Available Help at Discharge: Family;Friend(s) Type of Home: House Home Access: Stairs to enter Entrance Stairs-Rails: Left Entrance Stairs-Number of Steps: 3   Home Layout: One level Home Equipment: Rollator (4 wheels);Rolling Walker (2 wheels) (CPM)      Prior Function Prior Level of Function : Needs assist       Physical Assist : ADLs (physical);Mobility (physical) Mobility (physical): Gait ADLs  (physical): Dressing Mobility Comments: pt reports use of Rollator for household mobility and HHA from spouse for community navigation ADLs Comments: pt reprots requiring assist for lower body dressing     Extremity/Trunk Assessment        Lower Extremity Assessment Lower Extremity Assessment: LLE deficits/detail LLE Deficits / Details: ankle DF/PF 5/5;SLR < 10 degree lag LLE Sensation: WNL    Cervical / Trunk Assessment Cervical / Trunk Assessment: Back Surgery  Communication   Communication Communication: No apparent difficulties  Cognition Arousal: Alert Behavior During Therapy: WFL for tasks assessed/performed Overall Cognitive Status: Within Functional Limits for tasks assessed                                          General Comments      Exercises Total Joint Exercises Ankle Circles/Pumps: AROM, Both, 15 reps Quad Sets: AROM, Left, 5 reps Straight Leg Raises: AROM, Left, 10 reps   Assessment/Plan    PT Assessment Patient needs continued PT services  PT Problem List Decreased strength;Decreased range of motion;Decreased activity tolerance;Decreased balance;Decreased mobility;Decreased coordination;Pain       PT Treatment Interventions DME instruction;Gait training;Stair training;Functional mobility training;Therapeutic activities;Therapeutic exercise;Balance training;Neuromuscular re-education;Patient/family education;Modalities    PT Goals (Current goals can be found in the Care Plan section)  Acute Rehab PT Goals Patient Stated Goal: to do what I was doing before and even more with no pain PT Goal Formulation: With patient Time For Goal Achievement: 09/17/23 Potential to Achieve Goals: Good    Frequency 7X/week     Co-evaluation               AM-PAC PT "6 Clicks" Mobility  Outcome Measure Help needed turning from your back to your side while in a flat bed without using bedrails?: A Lot Help needed moving from lying on your  back to sitting on the side of a flat bed without using bedrails?: A Lot Help needed moving to and from a bed to a chair (including a wheelchair)?: Total Help needed standing up from a chair using your arms (e.g., wheelchair or bedside chair)?: Total Help needed to walk in hospital room?: Total Help needed climbing 3-5 steps with a railing? : Total 6 Click Score: 8    End of Session   Activity Tolerance: Patient limited by pain;Treatment limited secondary to medical complications (Comment) (nausea) Patient left: in bed;with call bell/phone within reach;with bed alarm set;with family/visitor present Nurse Communication: Mobility status;Patient requests pain meds PT Visit Diagnosis: Unsteadiness on feet (R26.81);Other abnormalities of gait and mobility (R26.89);Muscle weakness (generalized) (M62.81);Difficulty in walking, not elsewhere classified (R26.2);Pain Pain - Right/Left: Left Pain - part of body: Knee;Leg    Time: 0865-7846 PT Time Calculation (min) (ACUTE ONLY): 21 min   Charges:   PT Evaluation $PT Eval Low  Complexity: 1 Low   PT General Charges $$ ACUTE PT VISIT: 1 Visit         Johnny Bridge, PT Acute Rehab   Jacqualyn Posey 09/03/2023, 4:32 PM

## 2023-09-03 NOTE — Interval H&P Note (Signed)
History and Physical Interval Note:  09/03/2023 7:14 AM  Brenda [name]  has presented today for surgery, with the diagnosis of OA LEFT KNEE.  The various methods of treatment have been discussed with the patient and family. After consideration of risks, benefits and other options for treatment, the patient has consented to  Procedure(s): TOTAL KNEE ARTHROPLASTY (Left) as a surgical intervention.  The patient's history has been reviewed, patient examined, no change in status, stable for surgery.  I have reviewed the patient's chart and labs.  Questions were answered to the patient's satisfaction.     Sheral Apley

## 2023-09-03 NOTE — Anesthesia Procedure Notes (Signed)
Anesthesia Regional Block: Adductor canal block   Pre-Anesthetic Checklist: , timeout performed,  Correct Patient, Correct Site, Correct Laterality,  Correct Procedure, Correct Position, site marked,  Risks and benefits discussed,  Surgical consent,  Pre-op evaluation,  At surgeon's request and post-op pain management  Laterality: Lower and Left  Prep: chloraprep       Needles:  Injection technique: Single-shot  Needle Type: Stimiplex     Needle Length: 9cm  Needle Gauge: 21     Additional Needles:   Procedures:,,,, ultrasound used (permanent image in chart),,    Narrative:  Start time: 09/03/2023 8:00 AM End time: 09/03/2023 8:20 AM Injection made incrementally with aspirations every 5 mL.  Performed by: Personally  Anesthesiologist: Lewie Loron, MD  Additional Notes: BP cuff, EKG monitors applied. Sedation begun. Artery and nerve location verified with ultrasound. Anesthetic injected incrementally (5ml), slowly, and after negative aspirations under direct u/s guidance. Good fascial/perineural spread. Tolerated well.

## 2023-09-03 NOTE — Progress Notes (Signed)
Arrived to [facility] 1339 from PACU.  Vital signs obtained.     09/03/23 1531  Vitals  Temp 98 F (36.7 C)  Temp Source Axillary  BP (!) 148/71  MAP (mmHg) 90  BP Location Right Arm  BP Method Automatic  Patient Position (if appropriate) Lying  Pulse Rate 74  Pulse Rate Source Dinamap  Resp 14  Level of Consciousness  Level of Consciousness Alert  MEWS COLOR  MEWS Score Color Green  Oxygen Therapy  SpO2 97 %  O2 Device Nasal Cannula  O2 Flow Rate (L/min) 2 L/min  MEWS Score  MEWS Temp 0  MEWS Systolic 0  MEWS Pulse 0  MEWS RR 0  MEWS LOC 0  MEWS Score 0

## 2023-09-03 NOTE — Op Note (Signed)
DATE OF SURGERY:  09/03/2023 TIME: 9:54 AM  PATIENT NAME:  Brenda [name]   AGE: 67 y.o.    PRE-OPERATIVE DIAGNOSIS:  OA LEFT KNEE  POST-OPERATIVE DIAGNOSIS:  Same  PROCEDURE:  Procedure(s): TOTAL KNEE ARTHROPLASTY   SURGEON:  Sheral Apley, MD   ASSISTANT:  Levester Fresh, PA-C, she was present and scrubbed throughout the case, critical for completion in a timely fashion, and for retraction, instrumentation, and closure.    OPERATIVE IMPLANTS: [employer] Triathlon CR. Press fit knee  Femur size 3, Tibia size 3, Patella size 29 3-peg oval button, with a 9 mm polyethylene insert.   PREOPERATIVE INDICATIONS:  [name] is a 67 y.o. year old female with end stage bone on bone degenerative arthritis of the knee who failed conservative treatment, including injections, antiinflammatories, activity modification, and assistive devices, and had significant impairment of their activities of daily living, and elected for Total Knee Arthroplasty.   The risks, benefits, and alternatives were discussed at length including but not limited to the risks of infection, bleeding, nerve injury, stiffness, blood clots, the need for revision surgery, cardiopulmonary complications, among others, and they were willing to proceed.   OPERATIVE DESCRIPTION:  The patient was brought to the operative room and placed in a supine position.  General anesthesia was administered.  IV antibiotics were given.  The lower extremity was prepped and draped in the usual sterile fashion.  Time out was performed.  The leg was elevated and exsanguinated and the tourniquet was inflated.  Anterior approach was performed.  The patella was everted and osteophytes were removed.  The anterior horn of the medial and lateral meniscus was removed.   The distal femur was opened with the drill and the intramedullary distal femoral cutting jig was utilized, set at 5 degrees resecting 8 mm off the distal femur.  Care was taken to  protect the collateral ligaments.  The distal femoral sizing jig was applied, taking care to avoid notching.  Then the 4-in-1 cutting jig was applied and the anterior and posterior femur was cut, along with the chamfer cuts.  All posterior osteophytes were removed.  The flexion gap was then measured and was symmetric with the extension gap.  Then the extramedullary tibial cutting jig was utilized making the appropriate cut using the anterior tibial crest as a reference building in appropriate posterior slope.  Care was taken during the cut to protect the medial and collateral ligaments.  The proximal tibia was removed along with the posterior horns of the menisci.    I completed the distal femoral preparation using the appropriate jig to prepare the box.  The patella was then measured, and cut with the saw.    The proximal tibia sized and prepared accordingly with the reamer and the punch, and then all components were trialed with the above sized poly insert.  The knee was found to have excellent balance and full motion.    The above named components were then impacted into place and Poly tibial piece and patella were inserted.  I was very happy with his stability and ROM  I performed a periarticular injection with Exparel  The knee was easily taken through a range of motion and the patella tracked well and the knee irrigated copiously and the parapatellar and subcutaneous tissue closed with vicryl, and monocryl with steri strips for the skin.  The incision was dressed with sterile gauze and the tourniquet released and the patient was awakened and returned to the  PACU in stable and satisfactory condition.  There were no complications.  Total tourniquet time was roughly 75 minutes.   POSTOPERATIVE PLAN: post op Abx, DVT px: SCD's, TED's, Early ambulation and chemical px

## 2023-09-03 NOTE — Transfer of Care (Signed)
Immediate Anesthesia Transfer of Care Note  Patient: Brenda [name]  Procedure(s) Performed: TOTAL KNEE ARTHROPLASTY (Left: Knee)  Patient Location: PACU  Anesthesia Type:General  Level of Consciousness: drowsy and patient cooperative  Airway & Oxygen Therapy: Patient Spontanous Breathing and Patient connected to face mask oxygen  Post-op Assessment: Report given to RN and Post -op Vital signs reviewed and stable  Post vital signs: Reviewed and stable  Last Vitals:  Vitals Value Taken Time  BP 122/49 09/03/23 1045  Temp    Pulse 73 09/03/23 1047  Resp 10 09/03/23 1047  SpO2 99 % 09/03/23 1047  Vitals shown include unfiled device data.  Last Pain:  Vitals:   09/03/23 0827  TempSrc:   PainSc: 0-No pain      Patients Stated Pain Goal: 4 (09/03/23 0711)  Complications: No notable events documented.

## 2023-09-03 NOTE — Plan of Care (Signed)
Arrived from PACU, Alert and oriented. Medicated for pain, nausea, and muscle spasms. See MAR for details. Family visited at bedside.   Problem: Education: Goal: Knowledge of General Education information will improve Description: Including pain rating scale, medication(s)/side effects and non-pharmacologic comfort measures Outcome: Progressing   Problem: Health Behavior/Discharge Planning: Goal: Ability to manage health-related needs will improve Outcome: Progressing   Problem: Clinical Measurements: Goal: Ability to maintain clinical measurements within normal limits will improve Outcome: Progressing   Problem: Education: Goal: Knowledge of the prescribed therapeutic regimen will improve Outcome: Progressing   Problem: Activity: Goal: Ability to avoid complications of mobility impairment will improve Outcome: Progressing Goal: Range of joint motion will improve Outcome: Progressing   Problem: Pain Management: Goal: Pain level will decrease with appropriate interventions Outcome: Progressing

## 2023-09-04 ENCOUNTER — Encounter (HOSPITAL_COMMUNITY): Payer: Self-pay | Admitting: Orthopedic Surgery

## 2023-09-04 ENCOUNTER — Other Ambulatory Visit (HOSPITAL_COMMUNITY): Payer: Self-pay

## 2023-09-04 DIAGNOSIS — Z79899 Other long term (current) drug therapy: Secondary | ICD-10-CM | POA: Diagnosis not present

## 2023-09-04 DIAGNOSIS — E119 Type 2 diabetes mellitus without complications: Secondary | ICD-10-CM | POA: Diagnosis not present

## 2023-09-04 DIAGNOSIS — Z7984 Long term (current) use of oral hypoglycemic drugs: Secondary | ICD-10-CM | POA: Diagnosis not present

## 2023-09-04 DIAGNOSIS — J449 Chronic obstructive pulmonary disease, unspecified: Secondary | ICD-10-CM | POA: Diagnosis not present

## 2023-09-04 DIAGNOSIS — I2510 Atherosclerotic heart disease of native coronary artery without angina pectoris: Secondary | ICD-10-CM | POA: Diagnosis not present

## 2023-09-04 DIAGNOSIS — M1712 Unilateral primary osteoarthritis, left knee: Secondary | ICD-10-CM | POA: Diagnosis not present

## 2023-09-04 DIAGNOSIS — Z96652 Presence of left artificial knee joint: Secondary | ICD-10-CM | POA: Diagnosis not present

## 2023-09-04 DIAGNOSIS — Z96651 Presence of right artificial knee joint: Secondary | ICD-10-CM | POA: Diagnosis not present

## 2023-09-04 DIAGNOSIS — Z7902 Long term (current) use of antithrombotics/antiplatelets: Secondary | ICD-10-CM | POA: Diagnosis not present

## 2023-09-04 DIAGNOSIS — Z8616 Personal history of COVID-19: Secondary | ICD-10-CM | POA: Diagnosis not present

## 2023-09-04 MED ORDER — ONDANSETRON 4 MG PO TBDP
4.0000 mg | ORAL_TABLET | Freq: Three times a day (TID) | ORAL | 0 refills | Status: DC | PRN
  Filled 2023-09-04: qty 15, 5d supply, fill #0

## 2023-09-04 MED ORDER — OXYCODONE HCL 5 MG PO TABS
5.0000 mg | ORAL_TABLET | Freq: Four times a day (QID) | ORAL | 0 refills | Status: DC | PRN
  Filled 2023-09-04: qty 30, 8d supply, fill #0

## 2023-09-04 MED ORDER — METHOCARBAMOL 750 MG PO TABS
750.0000 mg | ORAL_TABLET | Freq: Three times a day (TID) | ORAL | 0 refills | Status: DC | PRN
  Filled 2023-09-04: qty 20, 7d supply, fill #0

## 2023-09-04 MED ORDER — RIVAROXABAN 10 MG PO TABS
10.0000 mg | ORAL_TABLET | Freq: Every day | ORAL | 0 refills | Status: DC
  Filled 2023-09-04: qty 30, 30d supply, fill #0

## 2023-09-04 NOTE — Care Management Obs Status (Signed)
MEDICARE OBSERVATION STATUS NOTIFICATION   Patient Details  Name: Brenda [name] MRN: [id number] Date of Birth: 16-Aug-1956   Medicare Observation Status Notification Given:  Yes    Ewing Schlein, LCSW 09/04/2023, 1:34 PM

## 2023-09-04 NOTE — Progress Notes (Signed)
Physical Therapy Treatment Patient Details Name: Brenda [name] MRN: [id number] DOB: 1956/08/25 Today's Date: 09/04/2023   History of Present Illness 67 yo female presents to therapy s/p L TKA on 09/03/2023 due to failure of conservative measures. Pt PMH includes but is not limited to: angina, CAD, LBP s/p surgeries, COPD, DM II, GERD, HLD, RA, and R partial KA (2019).    PT Comments   Brenda [name] is a 67 y.o. female POD 1 s/p L TKA. Patient reports required some assist with mobility at baseline. Patient is now limited by functional impairments (see PT problem list below) and requires CGA for bed mobility and CGA for transfers. Patient was able to ambulate 80 feet with RW and CGA level of assist. Patient instructed in exercise to facilitate ROM and circulation to manage edema. Pt indicates her pain is much better today 3/10 and she was no longer nauseated and able to enjoy dinner and breakfast. Pt noted to have underlying B ankle and LE instability not attributed to L TKA and states for the past 3 years. Pt left seated in recliner, all needs in place and PT to return later in day for continued instruction for HEP, gait and step navigation to progress towards d/c. Patient will benefit from continued skilled PT interventions to address impairments and progress towards PLOF. Acute PT will follow to progress mobility and stair training in preparation for safe discharge home with family support and OPPT services.    If plan is discharge home, recommend the following: Assistance with cooking/housework;Assist for transportation;Help with stairs or ramp for entrance;A little help with walking and/or transfers;A little help with bathing/dressing/bathroom   Can travel by private vehicle        Equipment Recommendations  None recommended by PT (pt reports RW and CPM delivered to home prior to surgery)    Recommendations for Other Services       Precautions / Restrictions Precautions Precautions:  Knee;Fall Restrictions Weight Bearing Restrictions: No     Mobility  Bed Mobility Overal bed mobility: Needs Assistance Bed Mobility: Supine to Sit     Supine to sit: Contact guard, Used rails, HOB elevated     General bed mobility comments: pt required increased time and min cues for supine to sit    Transfers Overall transfer level: Needs assistance Equipment used: Rolling walker (2 wheels) Transfers: Sit to/from Stand Sit to Stand: Contact guard assist           General transfer comment: min cues with pull to stand, pt demonstrated instability B LE with initial standing and reported that her "ankles give out on her" fequently and it is been going on for the past 3 years and pt denies falls stating that her husband helps her and holds on    Ambulation/Gait Ambulation/Gait assistance: Contact guard assist (recliner close) Gait Distance (Feet): 80 Feet Assistive device: Rolling walker (2 wheels) Gait Pattern/deviations: Step-to pattern, Shuffle, Trunk flexed, Antalgic Gait velocity: decresed     General Gait Details: encouragement for increased distance, mild lateral sway and wide BOS with limited L knee flexion in swing phase   Stairs             Wheelchair Mobility     Tilt Bed    Modified Rankin (Stroke Patients Only)       Balance Overall balance assessment: Needs assistance Sitting-balance support: Feet supported Sitting balance-Leahy Scale: Good     Standing balance support: Bilateral upper extremity supported, During functional activity, Reliant on  assistive device for balance Standing balance-Leahy Scale: Poor                              Cognition Arousal: Alert Behavior During Therapy: WFL for tasks assessed/performed Overall Cognitive Status: Within Functional Limits for tasks assessed                                          Exercises Total Joint Exercises Ankle Circles/Pumps: AROM, Both, 10  reps Quad Sets: AROM, Left, 5 reps Heel Slides: AROM, Left, 5 reps Hip ABduction/ADduction: AROM, Left, 5 reps Straight Leg Raises: AROM, Left, 5 reps    General Comments        Pertinent Vitals/Pain Pain Assessment Pain Assessment: 0-10 Pain Score: 3  Pain Location: L knee and back Pain Descriptors / Indicators: Constant, Aching, Discomfort, Grimacing, Operative site guarding Pain Intervention(s): Limited activity within patient's tolerance, Monitored during session, Premedicated before session, Repositioned, Ice applied    Home Living                          Prior Function            PT Goals (current goals can now be found in the care plan section) Acute Rehab PT Goals Patient Stated Goal: to do what I was doing before and even more with no pain PT Goal Formulation: With patient Time For Goal Achievement: 09/17/23 Potential to Achieve Goals: Good    Frequency    7X/week      PT Plan      Co-evaluation              AM-PAC PT "6 Clicks" Mobility   Outcome Measure  Help needed turning from your back to your side while in a flat bed without using bedrails?: A Little Help needed moving from lying on your back to sitting on the side of a flat bed without using bedrails?: A Little Help needed moving to and from a bed to a chair (including a wheelchair)?: A Little Help needed standing up from a chair using your arms (e.g., wheelchair or bedside chair)?: A Little Help needed to walk in hospital room?: A Little Help needed climbing 3-5 steps with a railing? : Total 6 Click Score: 16    End of Session Equipment Utilized During Treatment: Gait belt Activity Tolerance: Patient tolerated treatment well;No increased pain (nausea) Patient left: with call bell/phone within reach;in chair Nurse Communication: Mobility status;Patient requests pain meds PT Visit Diagnosis: Unsteadiness on feet (R26.81);Other abnormalities of gait and mobility  (R26.89);Muscle weakness (generalized) (M62.81);Difficulty in walking, not elsewhere classified (R26.2);Pain Pain - Right/Left: Left Pain - part of body: Knee;Leg     Time: 4098-1191 PT Time Calculation (min) (ACUTE ONLY): 23 min  Charges:    $Gait Training: 8-22 mins $Therapeutic Exercise: 8-22 mins PT General Charges $$ ACUTE PT VISIT: 1 Visit                     Brenda [name], PT Acute Rehab    Jacqualyn Posey 09/04/2023, 10:20 AM

## 2023-09-04 NOTE — Progress Notes (Signed)
Physical Therapy Treatment Patient Details Name: Brenda [name] MRN: [id number] DOB: 1956-01-15 Today's Date: 09/04/2023   History of Present Illness 67 yo female presents to therapy s/p L TKA on 09/03/2023 due to failure of conservative measures. Pt PMH includes but is not limited to: angina, CAD, LBP s/p surgeries, COPD, DM II, GERD, HLD, RA, and R partial KA (2019).    PT Comments   Brenda [name] is a 67 y.o. female POD 1 s/p L TKA. Patient reports required some assist with mobility at baseline. Patient is now limited by functional impairments (see PT problem list below) and requires S for bed mobility and CGA for transfers. Patient was able to ambulate 30 feet x 2 in hallway and 20 feet in personal room with RW and CGA level of assist. Pt and spouse instruction provided on step navigation with L handrail and R HHA with family performing return demonstration.  Patient instructed in exercise to facilitate ROM and circulation to manage edema reviewed and HO provided. Pt elected to return to bed, all needs in place and pt is ready for d/c from a PT perspective with no further pt or family questions and DME including CPM in home setting. Patient will benefit from continued skilled PT interventions to address impairments and progress towards PLOF. Acute PT will follow to progress mobility and stair training in preparation for safe discharge home with family support and OPPT services.    If plan is discharge home, recommend the following: Assistance with cooking/housework;Assist for transportation;Help with stairs or ramp for entrance;A little help with walking and/or transfers;A little help with bathing/dressing/bathroom   Can travel by private vehicle        Equipment Recommendations  None recommended by PT (pt reports RW and CPM delivered to home prior to surgery)    Recommendations for Other Services       Precautions / Restrictions Precautions Precautions:  Knee;Fall Restrictions Weight Bearing Restrictions: No     Mobility  Bed Mobility Overal bed mobility: Needs Assistance Bed Mobility: Supine to Sit     Supine to sit: HOB elevated, Used rails     General bed mobility comments: min cues    Transfers Overall transfer level: Needs assistance Equipment used: Rolling walker (2 wheels) Transfers: Sit to/from Stand Sit to Stand: Contact guard assist           General transfer comment: min cues for bed and commode transfers    Ambulation/Gait Ambulation/Gait assistance: Contact guard assist (recliner close) Gait Distance (Feet): 30 Feet (30 x 2 and 20) Assistive device: Rolling walker (2 wheels) Gait Pattern/deviations: Step-to pattern, Trunk flexed, Antalgic, Wide base of support Gait velocity: decresed     General Gait Details: improved B ankle stability with PM session, mild lateral sway and wide BOS with limited L knee flexion in swing phase   Stairs Stairs: Yes Stairs assistance: Contact guard assist Stair Management: Two rails, Step to pattern Number of Stairs: 3 General stair comments: step navigation initally with B handrails and subsequent trial with L handrail and HHA from spouse on R side per PLOF, no instability noted and pt tolerated well with min cues for sequencing and safety   Wheelchair Mobility     Tilt Bed    Modified Rankin (Stroke Patients Only)       Balance Overall balance assessment: Needs assistance Sitting-balance support: Feet supported Sitting balance-Leahy Scale: Good     Standing balance support: Bilateral upper extremity supported, During functional activity, Reliant on  assistive device for balance Standing balance-Leahy Scale: Fair Standing balance comment: static standing no UE support                            Cognition Arousal: Alert Behavior During Therapy: WFL for tasks assessed/performed Overall Cognitive Status: Within Functional Limits for tasks  assessed                                          Exercises Total Joint Exercises Ankle Circles/Pumps: AROM, Both, 10 reps Quad Sets: AROM, Left, 5 reps Heel Slides: AROM, Left, 5 reps Hip ABduction/ADduction: AROM, Left, 5 reps Straight Leg Raises: AROM, Left, 5 reps Knee Flexion: AROM, Left, 5 reps    General Comments        Pertinent Vitals/Pain Pain Assessment Pain Assessment: 0-10 Pain Score: 5  Pain Location: L knee and back Pain Descriptors / Indicators: Constant, Aching, Discomfort, Grimacing, Operative site guarding Pain Intervention(s): Limited activity within patient's tolerance, Monitored during session, Repositioned, Patient requesting pain meds-RN notified, Ice applied    Home Living                          Prior Function            PT Goals (current goals can now be found in the care plan section) Acute Rehab PT Goals Patient Stated Goal: to do what I was doing before and even more with no pain PT Goal Formulation: With patient Time For Goal Achievement: 09/17/23 Potential to Achieve Goals: Good Progress towards PT goals: Progressing toward goals    Frequency    7X/week      PT Plan      Co-evaluation              AM-PAC PT "6 Clicks" Mobility   Outcome Measure  Help needed turning from your back to your side while in a flat bed without using bedrails?: A Little Help needed moving from lying on your back to sitting on the side of a flat bed without using bedrails?: A Little Help needed moving to and from a bed to a chair (including a wheelchair)?: A Little Help needed standing up from a chair using your arms (e.g., wheelchair or bedside chair)?: A Little Help needed to walk in hospital room?: A Little Help needed climbing 3-5 steps with a railing? : A Little 6 Click Score: 18    End of Session Equipment Utilized During Treatment: Gait belt Activity Tolerance: Patient limited by pain (pt reports feeling  hot with facial flushing) Patient left: with call bell/phone within reach;in chair Nurse Communication: Mobility status;Patient requests pain meds (pt readiness for d/c from PT standpoint) PT Visit Diagnosis: Unsteadiness on feet (R26.81);Other abnormalities of gait and mobility (R26.89);Muscle weakness (generalized) (M62.81);Difficulty in walking, not elsewhere classified (R26.2);Pain Pain - Right/Left: Left Pain - part of body: Knee;Leg     Time: 6578-4696 PT Time Calculation (min) (ACUTE ONLY): 38 min  Charges:    $Gait Training: 8-22 mins $Therapeutic Exercise: 8-22 mins $Therapeutic Activity: 8-22 mins PT General Charges $$ ACUTE PT VISIT: 1 Visit                     Johnny Bridge, PT Acute Rehab    Jacqualyn Posey 09/04/2023, 4:01 PM

## 2023-09-04 NOTE — TOC Transition Note (Signed)
Transition of Care [facility]) - CM/SW Discharge Note  Patient Details  Name: Brenda [name] MRN: [id number] Date of Birth: Sep 05, 1956  [facility]) CM/SW Contact:  Ewing Schlein, LCSW Phone Number: 09/04/2023, 10:24 AM  Clinical Narrative: Patient is expected to discharge home after working with PT. CSW met with patient to confirm discharge plan. Patient will go home with OPPT at [facility] in [place]. [employer] set patient up with a rolling walker and CPM prior to surgery, so there are no DME needs at this time. TOC signing off.  Final next level of care: OP Rehab Barriers to Discharge: No Barriers Identified  Patient Goals and CMS Choice Choice offered to / list presented to : NA  Discharge Plan and Services Additional resources added to the After Visit Summary for          DME Arranged: N/A DME Agency: NA  Social Determinants of Health (SDOH) Interventions SDOH Screenings   Food Insecurity: No Food Insecurity (09/03/2023)  Housing: Low Risk  (09/03/2023)  Transportation Needs: No Transportation Needs (09/03/2023)  Utilities: Not At Risk (09/03/2023)  Alcohol Screen: Low Risk  (11/09/2022)  Depression (PHQ2-9): Medium Risk (08/01/2023)  Financial Resource Strain: Low Risk  (05/01/2023)  Physical Activity: Unknown (05/01/2023)  Recent Concern: Physical Activity - Insufficiently Active (04/17/2023)  Social Connections: Socially Integrated (05/01/2023)  Stress: No Stress Concern Present (05/01/2023)  Tobacco Use: Medium Risk (09/03/2023)   Readmission Risk Interventions     No data to display

## 2023-09-04 NOTE — Progress Notes (Signed)
Subjective: Patient reports pain as moderate to severe. Had nausea and vomiting yesterday. Still not feeling well but taking some bites of breakfast. Urinating.   No CP, SOB.  Has not mobilized OOB with PT yet.  Objective:   VITALS:   Vitals:   09/04/23 0132 09/04/23 0558 09/04/23 0822 09/04/23 0823  BP: (!) 104/52 (!) 99/56    Pulse: 72 65    Resp: 17 17    Temp: 98 F (36.7 C) 98.2 F (36.8 C)    TempSrc: Oral Oral    SpO2: 98% 98% 99% 99%  Weight:      Height:          Latest Ref Rng & Units 08/22/2023   11:39 AM 05/02/2023    2:43 PM 05/21/2022   12:00 AM  CBC  WBC 4.0 - 10.5 K/uL 9.9  9.3    Hemoglobin 12.0 - 15.0 g/dL 29.5  62.1  30.8      Hematocrit 36.0 - 46.0 % 42.1  42.7  40      Platelets 150 - 400 K/uL 136  121.0  163         This result is from an external source.      Latest Ref Rng & Units 08/22/2023   11:39 AM 08/01/2023    2:06 PM 05/02/2023    2:43 PM  BMP  Glucose 70 - 99 mg/dL 657  846  962   BUN 8 - 23 mg/dL 15  16  10    Creatinine 0.44 - 1.00 mg/dL 9.52  8.41  3.24   Sodium 135 - 145 mmol/L 141  141  141   Potassium 3.5 - 5.1 mmol/L 4.4  4.5  4.1   Chloride 98 - 111 mmol/L 103  104  103   CO2 22 - 32 mmol/L 27  28  31    Calcium 8.9 - 10.3 mg/dL 9.8  40.1  02.7    Intake/Output      10/29 0701 10/30 0700 10/30 0701 10/31 0700   P.O. 240    I.V. (mL/kg) 1371.7 (15.3) 75 (0.8)   IV Piggyback 400    Total Intake(mL/kg) 2011.7 (22.4) 75 (0.8)   Urine (mL/kg/hr) 2000 (0.9) 800 (5.2)   Blood 50    Total Output 2050 800   Net -38.3 -725           Physical Exam: General: NAD.  Sitting up in bed, calm, eating breakfast Resp: No increased wob Cardio: regular rate and rhythm ABD soft Neurologically intact MSK Neurovascularly intact Sensation intact distally Intact pulses distally Dorsiflexion/Plantar flexion intact Incision: dressing C/D/I   Assessment: 1 Day Post-Op  S/P Procedure(s) (LRB): TOTAL KNEE ARTHROPLASTY (Left) by  Dr. Jewel Baize. Murphy on 09/03/23  Principal Problem:   S/P total knee arthroplasty, left   Plan: Encourage confidence in her abilities  Advance diet Up with therapy Incentive Spirometry Elevate and Apply ice  Weightbearing: WBAT LLE Insicional and dressing care: Dressings left intact until follow-up and Reinforce dressings as needed Orthopedic device(s):  CPM Showering: Keep dressing dry VTE prophylaxis:  restart Plavix today, also added Xarelto   for 30 days postop , SCDs, ambulation Pain control: PRN, chronic pain patient Follow - up plan: 2 weeks Contact information:  Margarita Rana MD, Levester Fresh PA-C  Dispo: Home hopefully later today if pain controlled and passes PT evaluation. I'm concerned she will be slow moving due to her overall deconditioned state pre-op.     Jermarcus Mcfadyen M  Collene Ley[facility] Office [phone] 09/04/2023, 8:44 AM

## 2023-09-04 NOTE — Discharge Summary (Signed)
Physician Discharge Summary  Patient ID: Brenda [name] MRN: [id number] DOB/AGE: 07/09/1956 67 y.o.  Admit date: 09/03/2023 Discharge date: 09/04/2023  Admission Diagnoses: left knee osteoarthritis  Discharge Diagnoses:  Principal Problem:   S/P total knee arthroplasty, left   Discharged Condition: fair  Hospital Course: Patient underwent a left TKA by Dr. Eulah Pont on 09/03/23 without complications and spent the night in observation for pain control and mobilization. She has passed her PT evaluation and is ready for discharge home.  Consults: None  Significant Diagnostic Studies: n/a  Treatments: IV hydration, antibiotics: Ancef, analgesia: Dilaudid and Oxycodone, anticoagulation: Xarelto, therapies: PT and SW, and surgery: left TKA  Discharge Exam: Blood pressure (!) 114/50, pulse 94, temperature 98.6 F (37 C), resp. rate 16, height 5\' 4"  (1.626 m), weight 89.8 kg, SpO2 92%. General appearance: alert, cooperative, and no distress Head: Normocephalic, without obvious abnormality, atraumatic Resp: no increased work of breathing Cardio: regular rate and rhythm Extremities: extremities normal, atraumatic, no cyanosis or edema Pulses:  L brachial 2+ R brachial 2+  L radial 2+ R radial 2+  L inguinal 2+ R inguinal 2+  L popliteal 2+ R popliteal 2+  L posterior tibial 2+ R posterior tibial 2+  L dorsalis pedis 2+ R dorsalis pedis 2+   Neurologic: Grossly normal Incision/Wound: c/d/i  Disposition: Discharge disposition: 01-Home or Self Care       Discharge Instructions     CPM   Complete by: As directed    Continuous passive motion machine (CPM):      Use the CPM from 0 to 90 degrees for 4-6 hours per day.      You may break it up into 2 or 3 sessions per day.      Use CPM for 3 weeks or until you are told to stop.   Call MD / Call 911   Complete by: As directed    If you experience chest pain or shortness of breath, CALL 911 and be transported to the hospital  emergency room.  If you develope a fever above 101 F, pus (white drainage) or increased drainage or redness at the wound, or calf pain, call your surgeon's office.   Diet - low sodium heart healthy   Complete by: As directed    Discharge instructions   Complete by: As directed    You may bear weight as tolerated. Keep your dressing on and dry until follow up. Take medicine to prevent blood clots as directed. Take pain medicine as needed with the goal of transitioning to over the counter medicines.    INSTRUCTIONS AFTER JOINT REPLACEMENT   Remove items at home which could result in a fall. This includes throw rugs or furniture in walking pathways ICE to the affected joint every three hours while awake for 30 minutes at a time, for at least the first 3-5 days, and then as needed for pain and swelling.  Continue to use ice for pain and swelling. You may notice swelling that will progress down to the foot and ankle.  This is normal after surgery.  Elevate your leg when you are not up walking on it.   Continue to use the breathing machine you got in the hospital (incentive spirometer) which will help keep your temperature down.  It is common for your temperature to cycle up and down following surgery, especially at night when you are not up moving around and exerting yourself.  The breathing machine keeps your lungs expanded and your temperature  down.   DIET:  As you were doing prior to hospitalization, we recommend a well-balanced diet.  DRESSING / WOUND CARE / SHOWERING  You may shower 3 days after surgery, but keep the wounds dry during showering.  You may use an occlusive plastic wrap (Press'n Seal for example) with blue painter's tape at edges, NO SOAKING/SUBMERGING IN THE BATHTUB.  If the bandage gets wet, call the office.   ACTIVITY  Increase activity slowly as tolerated, but follow the weight bearing instructions below.   No driving for 6 weeks or until further direction given by your  physician.  You cannot drive while taking narcotics.  No lifting or carrying greater than 10 lbs. until further directed by your surgeon. Avoid periods of inactivity such as sitting longer than an hour when not asleep. This helps prevent blood clots.  You may return to work once you are authorized by your doctor.    WEIGHT BEARING   Weight bearing as tolerated with assist device (walker, cane, etc) as directed, use it as long as suggested by your surgeon or therapist, typically at least 4-6 weeks.   EXERCISES  Results after joint replacement surgery are often greatly improved when you follow the exercise, range of motion and muscle strengthening exercises prescribed by your doctor. Safety measures are also important to protect the joint from further injury. Any time any of these exercises cause you to have increased pain or swelling, decrease what you are doing until you are comfortable again and then slowly increase them. If you have problems or questions, call your caregiver or physical therapist for advice.   Rehabilitation is important following a joint replacement. After just a few days of immobilization, the muscles of the leg can become weakened and shrink (atrophy).  These exercises are designed to build up the tone and strength of the thigh and leg muscles and to improve motion. Often times heat used for twenty to thirty minutes before working out will loosen up your tissues and help with improving the range of motion but do not use heat for the first two weeks following surgery (sometimes heat can increase post-operative swelling).   These exercises can be done on a training (exercise) mat, on the floor, on a table or on a bed. Use whatever works the best and is most comfortable for you.    Use music or television while you are exercising so that the exercises are a pleasant break in your day. This will make your life better with the exercises acting as a break in your routine that you can  look forward to.   Perform all exercises about fifteen times, three times per day or as directed.  You should exercise both the operative leg and the other leg as well.  Exercises include:   Quad Sets - Tighten up the muscle on the front of the thigh (Quad) and hold for 5-10 seconds.   Straight Leg Raises - With your knee straight (if you were given a brace, keep it on), lift the leg to 60 degrees, hold for 3 seconds, and slowly lower the leg.  Perform this exercise against resistance later as your leg gets stronger.  Leg Slides: Lying on your back, slowly slide your foot toward your buttocks, bending your knee up off the floor (only go as far as is comfortable). Then slowly slide your foot back down until your leg is flat on the floor again.  Angel Wings: Lying on your back spread your legs to  the side as far apart as you can without causing discomfort.  Hamstring Strength:  Lying on your back, push your heel against the floor with your leg straight by tightening up the muscles of your buttocks.  Repeat, but this time bend your knee to a comfortable angle, and push your heel against the floor.  You may put a pillow under the heel to make it more comfortable if necessary.   A rehabilitation program following joint replacement surgery can speed recovery and prevent re-injury in the future due to weakened muscles. Contact your doctor or a physical therapist for more information on knee rehabilitation.    CONSTIPATION  Constipation is defined medically as fewer than three stools per week and severe constipation as less than one stool per week.  Even if you have a regular bowel pattern at home, your normal regimen is likely to be disrupted due to multiple reasons following surgery.  Combination of anesthesia, postoperative narcotics, change in appetite and fluid intake all can affect your bowels.   YOU MUST use at least one of the following options; they are listed in order of increasing strength to get  the job done.  They are all available over the counter, and you may need to use some, POSSIBLY even all of these options:    Drink plenty of fluids (prune juice may be helpful) and high fiber foods Colace 100 mg by mouth twice a day  Senokot for constipation as directed and as needed Dulcolax (bisacodyl), take with full glass of water  Miralax (polyethylene glycol) once or twice a day as needed.  If you have tried all these things and are unable to have a bowel movement in the first 3-4 days after surgery call either your surgeon or your primary doctor.    If you experience loose stools or diarrhea, hold the medications until you stool forms back up.  If your symptoms do not get better within 1 week or if they get worse, check with your doctor.  If you experience "the worst abdominal pain ever" or develop nausea or vomiting, please contact the office immediately for further recommendations for treatment.   ITCHING:  If you experience itching with your medications, try taking only a single pain pill, or even half a pain pill at a time.  You can also use Benadryl over the counter for itching or also to help with sleep.   TED HOSE STOCKINGS:  Use stockings on both legs until for at least 2 weeks or as directed by physician office. They may be removed at night for sleeping.  MEDICATIONS:  See your medication summary on the "After Visit Summary" that nursing will review with you.  You may have some home medications which will be placed on hold until you complete the course of blood thinner medication.  It is important for you to complete the blood thinner medication as prescribed.  Take medicines as prescribed.   You have several different medicines that work in different ways. - Tylenol is for mild to moderate pain. Try to take this medicine before turning to your narcotic medicines.  - Robaxin is for muscle spasms. This medicine can make you drowsy. DO NOT ALSO TAKE FLEXERIL WHILE TAKING THIS  MEDICINE!! - Oxycodone is a narcotic pain medicine.  Take this for severe pain. This medicine can be dehydrating / constipating. - Zofran is for nausea and vomiting. - Xarelto is to prevent blood clots after surgery. YOU MUST TAKE THIS MEDICINE!!  PRECAUTIONS:  If you experience chest pain or shortness of breath - call 911 immediately for transfer to the hospital emergency department.   If you develop a fever greater that 101 F, purulent drainage from wound, increased redness or drainage from wound, foul odor from the wound/dressing, or calf pain - CONTACT YOUR SURGEON.                                                   FOLLOW-UP APPOINTMENTS:  If you do not already have a post-op appointment, please call the office [phone] for an appointment to be seen by Dr. Eulah Pont in 2 weeks.   OTHER INSTRUCTIONS:   MAKE SURE YOU:  Understand these instructions.  Get help right away if you are not doing well or get worse.    Thank you for letting us be a part of your medical care team.  It is a privilege we respect greatly.  We hope these instructions will help you stay on track for a fast and full recovery!   Do not put a pillow under the knee. Place it under the heel.   Complete by: As directed    Driving restrictions   Complete by: As directed    No driving for 2-6 weeks   Post-operative opioid taper instructions:   Complete by: As directed    POST-OPERATIVE OPIOID TAPER INSTRUCTIONS: It is important to wean off of your opioid medication as soon as possible. If you do not need pain medication after your surgery it is ok to stop day one. Opioids include: Codeine, Hydrocodone(Norco, Vicodin), Oxycodone(Percocet, oxycontin) and hydromorphone amongst others.  Long term and even short term use of opiods can cause: Increased pain response Dependence Constipation Depression Respiratory depression And more.  Withdrawal symptoms can include Flu like symptoms Nausea, vomiting And  more Techniques to manage these symptoms Hydrate well Eat regular healthy meals Stay active Use relaxation techniques(deep breathing, meditating, yoga) Do Not substitute Alcohol to help with tapering If you have been on opioids for less than two weeks and do not have pain than it is ok to stop all together.  Plan to wean off of opioids This plan should start within one week post op of your joint replacement. Maintain the same interval or time between taking each dose and first decrease the dose.  Cut the total daily intake of opioids by one tablet each day Next start to increase the time between doses. The last dose that should be eliminated is the evening dose.      TED hose   Complete by: As directed    Use stockings (TED hose) for 2 weeks on left leg(s).  You may remove them at night for sleeping.   Weight bearing as tolerated   Complete by: As directed       Allergies as of 09/04/2023       Reactions   Penicillins Anaphylaxis, Swelling   DID THE REACTION INVOLVE: Swelling of the face/tongue/throat, SOB, or low BP? Yes Sudden or severe rash/hives, skin peeling, or the inside of the mouth or nose? Yes Did it require medical treatment? Yes When did it last happen? 67 years old If all above answers are "NO", may proceed with cephalosporin use.   Aspirin Other (See Comments)    GI Bleed   Beta Adrenergic Blockers Other (See Comments)   REACTION:  decreased libido   Citalopram Nausea And Vomiting   Nsaids Other (See Comments)    GI Upset   Other Hives, Itching   Walnuts    Plaquenil [hydroxychloroquine Sulfate] Other (See Comments)   Tachycardia   Prochlorperazine Edisylate Other (See Comments)    Stroke like symptoms   Sulfonamide Derivatives Other (See Comments)   Unknown allergic reaction   Amitriptyline Hcl Palpitations, Other (See Comments)    increased heart rate        Medication List     STOP taking these medications    cyclobenzaprine 10 MG  tablet Commonly known as: FLEXERIL       TAKE these medications    acetaminophen 500 MG tablet Commonly known as: TYLENOL Take 500 mg by mouth every 6 (six) hours as needed for fever.   albuterol 108 (90 Base) MCG/ACT inhaler Commonly known as: VENTOLIN HFA Inhale 2 puffs into the lungs every 4 (four) hours as needed for wheezing or shortness of breath (bronchitis). Reported on 03/12/2016   clopidogrel 75 MG tablet Commonly known as: PLAVIX TAKE 1 TABLET(75 MG) BY MOUTH DAILY   fenofibrate 160 MG tablet Take 1 tablet (160 mg total) by mouth daily. with food   folic acid 1 MG tablet Commonly known as: FOLVITE Take 1 mg by mouth daily.   Humira 40 MG/0.8ML prefilled syringe Generic drug: adalimumab Inject 40 mg into the skin every 14 (fourteen) days.   lisinopril 5 MG tablet Commonly known as: ZESTRIL TAKE 1 TABLET(5 MG) BY MOUTH DAILY   LORazepam 1 MG tablet Commonly known as: ATIVAN TAKE 1 TABLET(1 MG) BY MOUTH TWICE DAILY   metFORMIN 500 MG tablet Commonly known as: GLUCOPHAGE Take 1 tablet (500 mg total) by mouth 2 (two) times daily with a meal.   methocarbamol 750 MG tablet Commonly known as: Robaxin-750 Take 1 tablet (750 mg total) by mouth every 8 (eight) hours as needed for muscle spasms. STOP TAKING FLEXERIL WHILE USING THIS MEDICINE!   nitroGLYCERIN 0.4 MG SL tablet Commonly known as: NITROSTAT Place 1 tablet (0.4 mg total) under the tongue every 5 (five) minutes as needed for chest pain (MAX 3 doses.).   ondansetron 4 MG disintegrating tablet Commonly known as: ZOFRAN-ODT Take 1 tablet (4 mg total) by mouth every 8 (eight) hours as needed for nausea or vomiting.   oxyCODONE 5 MG immediate release tablet Commonly known as: Oxy IR/ROXICODONE 1-2 po tid pain What changed: Another medication with the same name was added. Make sure you understand how and when to take each.   oxyCODONE 5 MG immediate release tablet Commonly known as: Roxicodone Take 1  tablet (5 mg total) by mouth every 6 (six) hours as needed for breakthrough pain. For acute post-op pain that is not resolved by your normal daily dose of Oxycodone for chronic pain What changed: You were already taking a medication with the same name, and this prescription was added. Make sure you understand how and when to take each.   OXYGEN Inhale 2.5 L into the lungs at bedtime.   pantoprazole 40 MG tablet Commonly known as: PROTONIX TAKE 1 TABLET(40 MG) BY MOUTH DAILY   rivaroxaban 10 MG Tabs tablet Commonly known as: Xarelto Take 1 tablet (10 mg total) by mouth daily. For 30 days after surgery to prevent blood clots   rosuvastatin 40 MG tablet Commonly known as: CRESTOR TAKE 1 TABLET(40 MG) BY MOUTH DAILY   Stiolto Respimat 2.5-2.5 MCG/ACT Aers Generic drug: Tiotropium Bromide-Olodaterol Inhale 2  puffs into the lungs daily.               Discharge Care Instructions  (From admission, onward)           Start     Ordered   09/04/23 0000  Weight bearing as tolerated        09/04/23 1712            Follow-up Information     Sheral Apley, MD. Go on 09/18/2023.   Specialty: Orthopedic Surgery Why: Your appointment is scheduled for 4:15. Contact information: [address] [place] [address] [phone]         [facility] Follow up on 09/09/2023.   Why: your outpatient physical therapy is scheduled. the office will call you with a time Contact information: [address] [place] [address] [phone]                 Signed: Jenne Pane PA-C 09/04/2023, 5:14 PM

## 2023-09-04 NOTE — Anesthesia Postprocedure Evaluation (Signed)
Anesthesia Post Note  Patient: Brenda [name]  Procedure(s) Performed: TOTAL KNEE ARTHROPLASTY (Left: Knee)     Patient location during evaluation: PACU Anesthesia Type: General Level of consciousness: sedated and patient cooperative Pain management: pain level controlled Vital Signs Assessment: post-procedure vital signs reviewed and stable Respiratory status: spontaneous breathing Cardiovascular status: stable Anesthetic complications: no   No notable events documented.  Last Vitals:  Vitals:   09/04/23 0132 09/04/23 0558  BP: (!) 104/52 (!) 99/56  Pulse: 72 65  Resp: 17 17  Temp: 36.7 C 36.8 C  SpO2: 98% 98%    Last Pain:  Vitals:   09/04/23 0710  TempSrc:   PainSc: 0-No pain                 Lewie Loron

## 2023-09-09 DIAGNOSIS — R2689 Other abnormalities of gait and mobility: Secondary | ICD-10-CM | POA: Diagnosis not present

## 2023-09-09 DIAGNOSIS — Z96652 Presence of left artificial knee joint: Secondary | ICD-10-CM | POA: Diagnosis not present

## 2023-09-09 DIAGNOSIS — M25662 Stiffness of left knee, not elsewhere classified: Secondary | ICD-10-CM | POA: Diagnosis not present

## 2023-09-09 DIAGNOSIS — M25562 Pain in left knee: Secondary | ICD-10-CM | POA: Diagnosis not present

## 2023-09-11 DIAGNOSIS — R2689 Other abnormalities of gait and mobility: Secondary | ICD-10-CM | POA: Diagnosis not present

## 2023-09-11 DIAGNOSIS — M25662 Stiffness of left knee, not elsewhere classified: Secondary | ICD-10-CM | POA: Diagnosis not present

## 2023-09-11 DIAGNOSIS — Z96652 Presence of left artificial knee joint: Secondary | ICD-10-CM | POA: Diagnosis not present

## 2023-09-11 DIAGNOSIS — M25562 Pain in left knee: Secondary | ICD-10-CM | POA: Diagnosis not present

## 2023-09-15 DIAGNOSIS — R092 Respiratory arrest: Secondary | ICD-10-CM | POA: Diagnosis not present

## 2023-09-15 DIAGNOSIS — J441 Chronic obstructive pulmonary disease with (acute) exacerbation: Secondary | ICD-10-CM | POA: Diagnosis not present

## 2023-09-16 DIAGNOSIS — Z96652 Presence of left artificial knee joint: Secondary | ICD-10-CM | POA: Diagnosis not present

## 2023-09-16 DIAGNOSIS — R2689 Other abnormalities of gait and mobility: Secondary | ICD-10-CM | POA: Diagnosis not present

## 2023-09-16 DIAGNOSIS — M25562 Pain in left knee: Secondary | ICD-10-CM | POA: Diagnosis not present

## 2023-09-16 DIAGNOSIS — M25662 Stiffness of left knee, not elsewhere classified: Secondary | ICD-10-CM | POA: Diagnosis not present

## 2023-09-18 DIAGNOSIS — M1712 Unilateral primary osteoarthritis, left knee: Secondary | ICD-10-CM | POA: Diagnosis not present

## 2023-09-19 DIAGNOSIS — M25662 Stiffness of left knee, not elsewhere classified: Secondary | ICD-10-CM | POA: Diagnosis not present

## 2023-09-19 DIAGNOSIS — R2689 Other abnormalities of gait and mobility: Secondary | ICD-10-CM | POA: Diagnosis not present

## 2023-09-19 DIAGNOSIS — Z96652 Presence of left artificial knee joint: Secondary | ICD-10-CM | POA: Diagnosis not present

## 2023-09-19 DIAGNOSIS — M25562 Pain in left knee: Secondary | ICD-10-CM | POA: Diagnosis not present

## 2023-09-23 DIAGNOSIS — M25662 Stiffness of left knee, not elsewhere classified: Secondary | ICD-10-CM | POA: Diagnosis not present

## 2023-09-23 DIAGNOSIS — Z96652 Presence of left artificial knee joint: Secondary | ICD-10-CM | POA: Diagnosis not present

## 2023-09-23 DIAGNOSIS — M25562 Pain in left knee: Secondary | ICD-10-CM | POA: Diagnosis not present

## 2023-09-23 DIAGNOSIS — R2689 Other abnormalities of gait and mobility: Secondary | ICD-10-CM | POA: Diagnosis not present

## 2023-09-25 DIAGNOSIS — M25662 Stiffness of left knee, not elsewhere classified: Secondary | ICD-10-CM | POA: Diagnosis not present

## 2023-09-25 DIAGNOSIS — R2689 Other abnormalities of gait and mobility: Secondary | ICD-10-CM | POA: Diagnosis not present

## 2023-09-25 DIAGNOSIS — M25562 Pain in left knee: Secondary | ICD-10-CM | POA: Diagnosis not present

## 2023-09-25 DIAGNOSIS — Z96652 Presence of left artificial knee joint: Secondary | ICD-10-CM | POA: Diagnosis not present

## 2023-09-26 DIAGNOSIS — M0609 Rheumatoid arthritis without rheumatoid factor, multiple sites: Secondary | ICD-10-CM | POA: Diagnosis not present

## 2023-09-26 DIAGNOSIS — M810 Age-related osteoporosis without current pathological fracture: Secondary | ICD-10-CM | POA: Diagnosis not present

## 2023-09-26 DIAGNOSIS — E669 Obesity, unspecified: Secondary | ICD-10-CM | POA: Diagnosis not present

## 2023-09-26 DIAGNOSIS — M25562 Pain in left knee: Secondary | ICD-10-CM | POA: Diagnosis not present

## 2023-09-26 DIAGNOSIS — R748 Abnormal levels of other serum enzymes: Secondary | ICD-10-CM | POA: Diagnosis not present

## 2023-09-26 DIAGNOSIS — M1990 Unspecified osteoarthritis, unspecified site: Secondary | ICD-10-CM | POA: Diagnosis not present

## 2023-09-26 DIAGNOSIS — Z79899 Other long term (current) drug therapy: Secondary | ICD-10-CM | POA: Diagnosis not present

## 2023-09-26 DIAGNOSIS — M79643 Pain in unspecified hand: Secondary | ICD-10-CM | POA: Diagnosis not present

## 2023-09-26 DIAGNOSIS — M549 Dorsalgia, unspecified: Secondary | ICD-10-CM | POA: Diagnosis not present

## 2023-09-26 LAB — CBC AND DIFFERENTIAL
HCT: 38 (ref 36–46)
Hemoglobin: 12.3 (ref 12.0–16.0)
Neutrophils Absolute: 5.5
Platelets: 217 10*3/uL (ref 150–400)
WBC: 10.2

## 2023-09-26 LAB — BASIC METABOLIC PANEL
BUN: 13 (ref 4–21)
CO2: 25 — AB (ref 13–22)
Chloride: 103 (ref 99–108)
Creatinine: 0.8 (ref 0.5–1.1)
Glucose: 103
Potassium: 4.7 meq/L (ref 3.5–5.1)
Sodium: 144 (ref 137–147)

## 2023-09-26 LAB — POCT ERYTHROCYTE SEDIMENTATION RATE, NON-AUTOMATED: Sed Rate: 13

## 2023-09-26 LAB — HEPATIC FUNCTION PANEL
ALT: 10 U/L (ref 7–35)
AST: 19 (ref 13–35)
Alkaline Phosphatase: 97 (ref 25–125)
Bilirubin, Total: 0.4

## 2023-09-26 LAB — COMPREHENSIVE METABOLIC PANEL
Albumin: 4.4 (ref 3.5–5.0)
Calcium: 10.1 (ref 8.7–10.7)
Globulin: 3
eGFR: 86

## 2023-09-26 LAB — CBC: RBC: 3.95 (ref 3.87–5.11)

## 2023-09-30 ENCOUNTER — Other Ambulatory Visit: Payer: Self-pay | Admitting: Family Medicine

## 2023-09-30 DIAGNOSIS — M25562 Pain in left knee: Secondary | ICD-10-CM | POA: Diagnosis not present

## 2023-09-30 DIAGNOSIS — Z96652 Presence of left artificial knee joint: Secondary | ICD-10-CM | POA: Diagnosis not present

## 2023-09-30 DIAGNOSIS — M25662 Stiffness of left knee, not elsewhere classified: Secondary | ICD-10-CM | POA: Diagnosis not present

## 2023-09-30 DIAGNOSIS — R2689 Other abnormalities of gait and mobility: Secondary | ICD-10-CM | POA: Diagnosis not present

## 2023-09-30 NOTE — Telephone Encounter (Signed)
Oxycodone refill requested and sent to [facility] in [place] next OV 10/31/23

## 2023-10-01 MED ORDER — OXYCODONE HCL 5 MG PO TABS: ORAL_TABLET | ORAL | 0 refills | Status: DC

## 2023-10-02 ENCOUNTER — Other Ambulatory Visit: Payer: Self-pay

## 2023-10-02 MED ORDER — ROSUVASTATIN CALCIUM 40 MG PO TABS: ORAL_TABLET | ORAL | 1 refills | Status: DC

## 2023-10-07 DIAGNOSIS — R2689 Other abnormalities of gait and mobility: Secondary | ICD-10-CM | POA: Diagnosis not present

## 2023-10-07 DIAGNOSIS — M25662 Stiffness of left knee, not elsewhere classified: Secondary | ICD-10-CM | POA: Diagnosis not present

## 2023-10-07 DIAGNOSIS — Z96652 Presence of left artificial knee joint: Secondary | ICD-10-CM | POA: Diagnosis not present

## 2023-10-07 DIAGNOSIS — M25562 Pain in left knee: Secondary | ICD-10-CM | POA: Diagnosis not present

## 2023-10-10 DIAGNOSIS — Z96652 Presence of left artificial knee joint: Secondary | ICD-10-CM | POA: Diagnosis not present

## 2023-10-10 DIAGNOSIS — R2689 Other abnormalities of gait and mobility: Secondary | ICD-10-CM | POA: Diagnosis not present

## 2023-10-10 DIAGNOSIS — M25662 Stiffness of left knee, not elsewhere classified: Secondary | ICD-10-CM | POA: Diagnosis not present

## 2023-10-10 DIAGNOSIS — M25562 Pain in left knee: Secondary | ICD-10-CM | POA: Diagnosis not present

## 2023-10-15 DIAGNOSIS — Z96652 Presence of left artificial knee joint: Secondary | ICD-10-CM | POA: Diagnosis not present

## 2023-10-15 DIAGNOSIS — J441 Chronic obstructive pulmonary disease with (acute) exacerbation: Secondary | ICD-10-CM | POA: Diagnosis not present

## 2023-10-15 DIAGNOSIS — M25662 Stiffness of left knee, not elsewhere classified: Secondary | ICD-10-CM | POA: Diagnosis not present

## 2023-10-15 DIAGNOSIS — M25562 Pain in left knee: Secondary | ICD-10-CM | POA: Diagnosis not present

## 2023-10-15 DIAGNOSIS — R092 Respiratory arrest: Secondary | ICD-10-CM | POA: Diagnosis not present

## 2023-10-15 DIAGNOSIS — R2689 Other abnormalities of gait and mobility: Secondary | ICD-10-CM | POA: Diagnosis not present

## 2023-10-18 DIAGNOSIS — Z96652 Presence of left artificial knee joint: Secondary | ICD-10-CM | POA: Diagnosis not present

## 2023-10-18 DIAGNOSIS — M25562 Pain in left knee: Secondary | ICD-10-CM | POA: Diagnosis not present

## 2023-10-18 DIAGNOSIS — M25662 Stiffness of left knee, not elsewhere classified: Secondary | ICD-10-CM | POA: Diagnosis not present

## 2023-10-18 DIAGNOSIS — R2689 Other abnormalities of gait and mobility: Secondary | ICD-10-CM | POA: Diagnosis not present

## 2023-10-31 ENCOUNTER — Ambulatory Visit (INDEPENDENT_AMBULATORY_CARE_PROVIDER_SITE_OTHER): Payer: Medicare PPO | Admitting: Family Medicine

## 2023-10-31 VITALS — BP 125/77 | HR 98 | Temp 98.8°F | Wt 190.4 lb

## 2023-10-31 DIAGNOSIS — M25561 Pain in right knee: Secondary | ICD-10-CM

## 2023-10-31 DIAGNOSIS — G894 Chronic pain syndrome: Secondary | ICD-10-CM

## 2023-10-31 DIAGNOSIS — Z7984 Long term (current) use of oral hypoglycemic drugs: Secondary | ICD-10-CM | POA: Diagnosis not present

## 2023-10-31 DIAGNOSIS — F411 Generalized anxiety disorder: Secondary | ICD-10-CM | POA: Diagnosis not present

## 2023-10-31 DIAGNOSIS — G8929 Other chronic pain: Secondary | ICD-10-CM

## 2023-10-31 DIAGNOSIS — M25562 Pain in left knee: Secondary | ICD-10-CM

## 2023-10-31 DIAGNOSIS — E119 Type 2 diabetes mellitus without complications: Secondary | ICD-10-CM | POA: Diagnosis not present

## 2023-10-31 DIAGNOSIS — M545 Low back pain, unspecified: Secondary | ICD-10-CM | POA: Diagnosis not present

## 2023-10-31 LAB — POCT GLYCOSYLATED HEMOGLOBIN (HGB A1C)
HbA1c POC (<> result, manual entry): 5.6 % (ref 4.0–5.6)
HbA1c, POC (controlled diabetic range): 5.6 % (ref 0.0–7.0)
HbA1c, POC (prediabetic range): 5.6 % — AB (ref 5.7–6.4)
Hemoglobin A1C: 5.6 % (ref 4.0–5.6)

## 2023-10-31 MED ORDER — CYCLOBENZAPRINE HCL 10 MG PO TABS: 10.0000 mg | ORAL_TABLET | Freq: Three times a day (TID) | ORAL | 2 refills | Status: DC | PRN

## 2023-10-31 MED ORDER — METFORMIN HCL 500 MG PO TABS: ORAL_TABLET | ORAL | Status: DC

## 2023-10-31 MED ORDER — FENOFIBRATE 160 MG PO TABS: 160.0000 mg | ORAL_TABLET | Freq: Every day | ORAL | 3 refills | Status: DC

## 2023-10-31 MED ORDER — OXYCODONE HCL 5 MG PO TABS: ORAL_TABLET | ORAL | 0 refills | Status: DC

## 2023-10-31 NOTE — Progress Notes (Signed)
OFFICE VISIT  10/31/2023  CC:  Chief Complaint  Patient presents with   Diabetes    Patient is a 67 y.o. female who presents for 62-month follow-up chronic pain syndrome, anxiety, and diabetes. A/P as of last visit: "#1 diabetes without complication. Last visit was the first time her A1c rose above 6.5% (was 7.2% at that time). POC Hba1c today is 6.5%. Continue metformin 500 mg twice a day. Check electrolytes and creatinine today. Urine microalbumin/creatinine today.   2.  Chronic pain syndrome.  Chronic bilateral low back pain without sciatica and chronic bilateral knee pain due to severe osteoarthritis. Severe but stable.  Hopefully left total knee arthroplasty coming up on 09/03/2023 will be very helpful. Refilled oxycodone 5 mg, 1-2 3 times daily as needed, #180. UDS today."  INTERIM HX: She underwent left total knee arthroplasty on 09/03/2023. Doing better. With all the very cold weather lately her joints have been hurting very bad.  She also had to get off Humira for about 6 weeks in the perioperative phase. She has been back on this for a few weeks and her hands and feet are getting better. She is getting more active and feels like the knee is more stable.  She has still had to use her usual amount of pain medication.  Indication for chronic opioid: chronic bilat LBP w/out sciatica, and chronic pain from bilat osteoarthritis knees.  Also LBP and bilat shoulder pain.  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  She remains on Humira and is doing well on this.  She still takes 1-2 oxycodone 3 times a day and this sufficiently helps control her pain to allow for maximum functioning and quality of life.  NSAID GI intolerance.  PMP AWARE reviewed today: most recent rx for lorazepam was filled 10/02/2023, # 60, rx by me. Most recent prescription for oxycodone 5 mg was filled 10/02/2023, #180, prescription by me. No red flags.  Chronic anxiety levels  stable. Mood is good.   Past Medical History:  Diagnosis Date   Allergy    Anxiety    Aortic valve stenosis    "very mild"   Atypical chest pain 2007; 2015   cardiolyte neg, echo nl, cath showed mild/nonobstructive LAD disease   Bilateral primary osteoarthritis of knee 06/30/2012   Right >L.  Right unicompartmental knee arthroplasty 10/25/18   CAD (coronary artery disease)    Chronic LBP    Multiple surgeries   COPD (chronic obstructive pulmonary disease) (HCC)    COVID-19 virus infection 06/02/2019   Eval at [facility] ED and d/c'd home.  Admitted 06/12/19 with hypoxic RF and bilat infiltrates.   DDD (degenerative disc disease)    spinal stenosis   Diabetes mellitus without complication (HCC)    04/2023 a1c 7.2%   Elevated liver enzymes    2021 after starting methotrexate. 2022->rheum d/c'd her methotrexate   Her abd u/s 12/05/20 showed fatty liver, o/w normal.   Fatty liver    11/2020 liver u/s   Gastritis    H pylori NEG on EGD 02/2021   GERD (gastroesophageal reflux disease)    History of adenomatous polyp of colon 06/2021   2022, recall 5 yrs   History of GI bleed    NSAIDS   Hyperlipidemia    mixed   Microscopic hematuria    full w/ u unrevealing X 2   Normal nuclear stress test 11/11 and 06/2014   negative, EF normal   Oxygen deficiency    Just At night  Palpitations 2006   48H holter neg   Positive occult stool blood test    iFOB positive 04/2021->colonoscopy showed multiple adenomatous polyps and nonbleeding int hem.   RBBB (right bundle branch block)    Recurrent UTI    Seronegative rheumatoid arthritis (HCC) 10/2019   multiple sites->pred started, methotrex started 10/2019; rheum to add humira as of 03/2020   Tobacco dependence in remission    Quit fall 2015.      Past Surgical History:  Procedure Laterality Date   ABDOMINAL HYSTERECTOMY  1997   DUB   APPENDECTOMY  1984   BIOPSY  02/14/2021   Procedure: BIOPSY;  Surgeon: Lanelle Bal, DO;  Location: [facility]  ENDO SUITE;  Service: Endoscopy;;   CARDIAC CATHETERIZATION  10/09/2005   no CAD, mildly elevated LVEDP, normal LV function (Dr. Laurell Josephs)   CARDIAC CATHETERIZATION N/A 05/16/2015   Mild non-obstructive CAD--med mgmt.  Procedure: Left Heart Cath and Coronary Angiography;  Surgeon: Chrystie Nose, MD;  Location: [facility] INVASIVE CV LAB;  Service: Cardiovascular;  Laterality: N/A;   CARDIOVASCULAR STRESS TEST  06/2014   normal lexiscan NST   CARDIOVASCULAR STRESS TEST  2006   persantine - no ischemia, low risk    COLONOSCOPY W/ POLYPECTOMY  06/12/2021   multiple adenomas--recall 3-5 yrs   COLONOSCOPY WITH PROPOFOL N/A 06/12/2021   Procedure: COLONOSCOPY WITH PROPOFOL;  Surgeon: Lanelle Bal, DO;  Location: [facility];  Service: Endoscopy;  Laterality: N/A;  8:30am   ESOPHAGOGASTRODUODENOSCOPY  02/14/2021   Gastritis ( H pylori NEG) w/out bleeding   ESOPHAGOGASTRODUODENOSCOPY (EGD) WITH PROPOFOL N/A 02/14/2021   Procedure: ESOPHAGOGASTRODUODENOSCOPY (EGD) WITH PROPOFOL;  Surgeon: Lanelle Bal, DO;  Location: [facility];  Service: Endoscopy;  Laterality: N/A;  AM   JOINT REPLACEMENT  11-05-2017   Partial right knee replacement   KNEE ARTHROSCOPY Bilateral    left wrist ganglion cyst excision  2010   LUMBAR SPINE SURGERY  1993   right iliac crest bone graft+metal instrumentation; 2005 metal removal and decompression, 2011 lumbar decompression 4-11, then stabilization/ instrumentation done 09-19-10: L2,L3, L5 left and L2 , L3, L4 right pedical remnant L4 left embedded. Left iliac crest bone graft-- Dr Lily Lovings SPINE SURGERY  02/10/2016   Dr. Clotilde Dieter: lumbar decompression, instrumentation removal, and fusion exploration--HELPED her a lot, esp her radicular leg pains.   OOPHORECTOMY Right    cyst   PARTIAL KNEE ARTHROPLASTY Right 11/04/2018   Procedure: UNICOMPARTMENTAL KNEE;  Surgeon: Sheral Apley, MD;  Location: [facility] ORS;  Service: Orthopedics;  Laterality: Right;  Adductor  Block   POLYPECTOMY  06/12/2021   Procedure: POLYPECTOMY;  Surgeon: Lanelle Bal, DO;  Location: [facility];  Service: Endoscopy;;   SPINE SURGERY  05-24-1991   Dr Clotilde Dieter surgeon [facility]. 2 level spinal fusion. 09-22-1998, 06-21-2004 took plates and screws out also got bone off of nerves. 2012 took out plates and screws and got bones off of nerves. 2017 2 level spinal fusion and scraped bones away from nerves.   TONSILLECTOMY  67 yrs old   TOTAL KNEE ARTHROPLASTY Left 09/03/2023   Procedure: TOTAL KNEE ARTHROPLASTY;  Surgeon: Sheral Apley, MD;  Location: [facility] ORS;  Service: Orthopedics;  Laterality: Left;   TRANSTHORACIC ECHOCARDIOGRAM  2006   EF=>55%, trace MR, mild TR, trace AV regurg, trace pulm valve regurg    TUBAL LIGATION      Outpatient Medications Prior to Visit  Medication Sig Dispense Refill  acetaminophen (TYLENOL) 500 MG tablet Take 500 mg by mouth every 6 (six) hours as needed for fever.     Adalimumab (HUMIRA) 40 MG/0.8ML PSKT Inject 40 mg into the skin every 14 (fourteen) days.     albuterol (VENTOLIN HFA) 108 (90 Base) MCG/ACT inhaler Inhale 2 puffs into the lungs every 4 (four) hours as needed for wheezing or shortness of breath (bronchitis). Reported on 03/12/2016 18 g 1   clopidogrel (PLAVIX) 75 MG tablet TAKE 1 TABLET(75 MG) BY MOUTH DAILY 90 tablet 3   folic acid (FOLVITE) 1 MG tablet Take 1 mg by mouth daily.      lisinopril (ZESTRIL) 5 MG tablet TAKE 1 TABLET(5 MG) BY MOUTH DAILY 90 tablet 3   LORazepam (ATIVAN) 1 MG tablet TAKE 1 TABLET(1 MG) BY MOUTH TWICE DAILY 60 tablet 5   nitroGLYCERIN (NITROSTAT) 0.4 MG SL tablet Place 1 tablet (0.4 mg total) under the tongue every 5 (five) minutes as needed for chest pain (MAX 3 doses.). 10 tablet 1   OXYGEN Inhale 2.5 L into the lungs at bedtime.     pantoprazole (PROTONIX) 40 MG tablet TAKE 1 TABLET(40 MG) BY MOUTH DAILY 90 tablet 1   rosuvastatin (CRESTOR) 40 MG tablet TAKE 1 TABLET(40 MG) BY MOUTH DAILY 90 tablet  1   Tiotropium Bromide-Olodaterol (STIOLTO RESPIMAT) 2.5-2.5 MCG/ACT AERS Inhale 2 puffs into the lungs daily. 1 each 11   fenofibrate 160 MG tablet Take 1 tablet (160 mg total) by mouth daily. with food 90 tablet 1   metFORMIN (GLUCOPHAGE) 500 MG tablet Take 1 tablet (500 mg total) by mouth 2 (two) times daily with a meal. 180 tablet 3   methocarbamol (ROBAXIN-750) 750 MG tablet Take 1 tablet (750 mg total) by mouth every 8 (eight) hours as needed for muscle spasms. STOP TAKING FLEXERIL WHILE USING THIS MEDICINE! 20 tablet 0   ondansetron (ZOFRAN-ODT) 4 MG disintegrating tablet Take 1 tablet (4 mg total) by mouth every 8 (eight) hours as needed for nausea or vomiting. 15 tablet 0   oxyCODONE (OXY IR/ROXICODONE) 5 MG immediate release tablet 1-2 po tid pain 180 tablet 0   rivaroxaban (XARELTO) 10 MG TABS tablet Take 1 tablet (10 mg total) by mouth daily. For 30 days after surgery to prevent blood clots (Patient not taking: Reported on 10/31/2023) 30 tablet 0   No facility-administered medications prior to visit.    Allergies  Allergen Reactions   Penicillins Anaphylaxis and Swelling    DID THE REACTION INVOLVE: Swelling of the face/tongue/throat, SOB, or low BP? Yes Sudden or severe rash/hives, skin peeling, or the inside of the mouth or nose? Yes Did it require medical treatment? Yes When did it last happen? 67 years old If all above answers are "NO", may proceed with cephalosporin use.    Aspirin Other (See Comments)     GI Bleed   Beta Adrenergic Blockers Other (See Comments)    REACTION: decreased libido   Citalopram Nausea And Vomiting   Nsaids Other (See Comments)     GI Upset   Other Hives and Itching    Walnuts    Plaquenil [Hydroxychloroquine Sulfate] Other (See Comments)    Tachycardia   Prochlorperazine Edisylate Other (See Comments)     Stroke like symptoms   Sulfonamide Derivatives Other (See Comments)    Unknown allergic reaction   Amitriptyline Hcl Palpitations and  Other (See Comments)     increased heart rate    Review of Systems As per HPI  PE:    10/31/2023    1:58 PM 09/04/2023    1:25 PM 09/04/2023    9:48 AM  Vitals with BMI  Weight 190 lbs 6 oz    Systolic 125 114 829  Diastolic 77 50 59  Pulse 98 94 91     Physical Exam  Gen: Alert, well appearing.  Patient is oriented to person, place, time, and situation. AFFECT: pleasant, lucid thought and speech. No further exam today  LABS:  Last CBC Lab Results  Component Value Date   WBC 10.2 09/26/2023   HGB 12.3 09/26/2023   HCT 38 09/26/2023   MCV 94.6 08/22/2023   MCH 31.2 08/22/2023   RDW 12.7 08/22/2023   PLT 217 09/26/2023   Last metabolic panel Lab Results  Component Value Date   GLUCOSE 127 (H) 08/22/2023   NA 144 09/26/2023   K 4.7 09/26/2023   CL 103 09/26/2023   CO2 25 (A) 09/26/2023   BUN 13 09/26/2023   CREATININE 0.8 09/26/2023   GFRNONAA >60 08/22/2023   CALCIUM 10.1 09/26/2023   PHOS 3.8 07/19/2022   PROT 7.4 08/22/2023   ALBUMIN 4.4 09/26/2023   LABGLOB 2.5 01/05/2021   AGRATIO 1.8 01/05/2021   BILITOT 0.9 08/22/2023   ALKPHOS 97 09/26/2023   AST 19 09/26/2023   ALT 10 09/26/2023   ANIONGAP 11 08/22/2023   Last lipids Lab Results  Component Value Date   CHOL 104 05/02/2023   HDL 38.90 (L) 05/02/2023   LDLCALC 44 05/02/2023   LDLDIRECT 172.2 07/14/2014   TRIG 108.0 05/02/2023   CHOLHDL 3 05/02/2023   Last hemoglobin A1c Lab Results  Component Value Date   HGBA1C 6.5 (A) 08/01/2023   HGBA1C 6.5 08/01/2023   HGBA1C 6.5 (A) 08/01/2023   HGBA1C 6.5 08/01/2023   Last thyroid functions Lab Results  Component Value Date   TSH 0.42 09/24/2019   IMPRESSION AND PLAN:  #1 diabetes without complication, very well-controlled on metformin 500 mg twice a day. POC Hba1c today is 5.6%. Decrease metformin to 500 mg only once daily.  2.  Chronic pain syndrome. Chronic bilateral knee pain and low back pain without sciatica. All stable.   Knee is more stable since having replacement surgery a couple months ago.  As she rehabs this she still has pretty significant pain.  She will remain on the same pain medication regimen. Oxycodone 5 mg, 1-2 3 times daily as needed, #180--> prescription sent today.  3.  GAD. She is doing well long-term on lorazepam 1 mg twice daily.  Reviewed CBC with differential and complete metabolic panel from her rheumatology follow-up visit on 09/27/2023 and these were all normal.   An After Visit Summary was printed and given to the patient.  FOLLOW UP: Return in about 3 months (around 01/29/2024) for routine chronic illness f/u. Next cpe 04/2024 Signed:  Santiago Bumpers, MD           10/31/2023

## 2023-11-01 LAB — MICROALBUMIN / CREATININE URINE RATIO
Creatinine,U: 240.5 mg/dL
Microalb Creat Ratio: 1.9 mg/g (ref 0.0–30.0)
Microalb, Ur: 4.5 mg/dL — ABNORMAL HIGH (ref 0.0–1.9)

## 2023-11-29 ENCOUNTER — Other Ambulatory Visit: Payer: Self-pay | Admitting: Family Medicine

## 2023-11-29 MED ORDER — OXYCODONE HCL 5 MG PO TABS: ORAL_TABLET | ORAL | 0 refills | Status: DC

## 2023-12-11 ENCOUNTER — Telehealth: Payer: Self-pay

## 2023-12-11 ENCOUNTER — Other Ambulatory Visit (HOSPITAL_COMMUNITY): Payer: Self-pay

## 2023-12-11 NOTE — Telephone Encounter (Signed)
 Please advise Copied from CRM [id number]. Topic: Clinical - Prescription Issue >> Dec 10, 2023 12:55 PM Isabell A wrote: Reason for CRM: Patient states oxyCODONE  (OXY IR/ROXICODONE ) 5 MG immediate release tablet is currently on back order at her pharmacy, she usually get 180 quantity, but all they had was 139. Patient received 139 tablets but pharmacy told her to tell her provider to check if there is anything else they can send in because the won't know when they're receiving the oxyCODONE .Patient asked that if Dr. Glenda could send the remainder of her oxycodone  supply to another pharmacy in the area that does not have it on backorder, she would be happy to pick it up from an alternate pharmacy

## 2023-12-11 NOTE — Telephone Encounter (Signed)
   Reason for CRM: patient returned call to let CMA know that  [facility]  Phone: [phone] Fax: [phone]  Has medication oxyCODONE  (OXY IR/ROXICODONE ) 5 MG immediate release tablet In stock. They told patient that she would have to have the whole prescription fill instead of 41 because she already had 139 filled. System would say it would be too soon for her to have filled. She is wondering if Dr Candise would like to maybe prescribe her a different kind  of pain medication  for 41 days so that shew wont be in pain.

## 2023-12-11 NOTE — Telephone Encounter (Signed)
 I will prescribe the 10 mg oxycodone  tabs for her. However, please verify if she wants the 10 mg prescription sent to her usual local pharmacy or does she want to [facility] pharmacy? Let me know.

## 2023-12-11 NOTE — Telephone Encounter (Signed)
 I will do this rx but please have patient find out which pharmacy has the pill in stock so I can know where to send it.

## 2023-12-11 NOTE — Telephone Encounter (Signed)
 Spoke with pt and she will call around to see which pharmacy has it in stock

## 2023-12-12 ENCOUNTER — Other Ambulatory Visit (HOSPITAL_COMMUNITY): Payer: Self-pay

## 2023-12-12 MED ORDER — OXYCODONE HCL 10 MG PO TABS
ORAL_TABLET | ORAL | 0 refills | Status: DC
  Filled 2023-12-12: qty 90, 23d supply, fill #0

## 2023-12-12 NOTE — Telephone Encounter (Signed)
 Oxycodone  10mg  rx sent to [facility]

## 2023-12-12 NOTE — Addendum Note (Signed)
 Addended by: Shelvia Dick on: 12/12/2023 02:13 PM   Modules accepted: Orders

## 2023-12-12 NOTE — Telephone Encounter (Signed)
 Left pt a VM to return my call

## 2023-12-12 NOTE — Telephone Encounter (Signed)
 Pt would like medication sent to [facility]

## 2023-12-30 ENCOUNTER — Other Ambulatory Visit: Payer: Self-pay | Admitting: Family Medicine

## 2024-01-03 ENCOUNTER — Telehealth: Payer: Self-pay

## 2024-01-03 NOTE — Telephone Encounter (Signed)
 Copied from CRM [id number]. Topic: Clinical - Prescription Issue >> Jan 03, 2024 10:59 AM Orinda Kenner C wrote: Reason for CRM: Patient states [facility] pharmacist told patient there's an interaction for LORazepam (ATIVAN) 1 MG tablet 1 tablet twice day and Oxycodone HCl 10 MG TABS and Dr. Milinda Cave needs to fill out paperwork, before patient can have medications. Patient states been taking these medications together for years with no problems. Please advise and contact through MyChart.   [facility] [phone] - [place], [place] - [address] AT [facility] [address] [place] [address] Phone:[phone]:[phone]

## 2024-01-03 NOTE — Telephone Encounter (Signed)
 Spoke with pharmacist and advised to call [phone] or [phone] to complete PA

## 2024-01-03 NOTE — Telephone Encounter (Signed)
 Please assist with PA.

## 2024-01-06 ENCOUNTER — Other Ambulatory Visit (HOSPITAL_COMMUNITY): Payer: Self-pay

## 2024-01-06 ENCOUNTER — Telehealth: Payer: Self-pay

## 2024-01-06 NOTE — Telephone Encounter (Signed)
 Pharmacy Patient Advocate Encounter   Received notification from Pt Calls Messages that prior authorization for oxyCODONE HCl 10MG  tablets is required/requested.   Insurance verification completed.   The patient is insured through [place] .   Per test claim: PA required; PA submitted to above mentioned insurance via CoverMyMeds Key/confirmation #/EOC [facility] Status is pending

## 2024-01-06 NOTE — Telephone Encounter (Signed)
 PA request has been Submitted. New Encounter has been or will be created for follow up. For additional info see Pharmacy Prior Auth telephone encounter from 01/06/24.

## 2024-01-07 ENCOUNTER — Other Ambulatory Visit (HOSPITAL_COMMUNITY): Payer: Self-pay

## 2024-01-07 NOTE — Telephone Encounter (Signed)
 Pharmacy Patient Advocate Encounter  Received notification from [facility] that Prior Authorization for oxyCODONE HCl 10MG  tablets  has been APPROVED from 11/06/23 to 11/04/24. Ran test claim, Copay is $0. This test claim was processed through [facility]- copay amounts may vary at other pharmacies due to pharmacy/plan contracts, or as the patient moves through the different stages of their insurance plan.   PA #/Case ID/Reference #:  [id number]

## 2024-01-08 NOTE — Telephone Encounter (Signed)
 Called pt to explain.

## 2024-01-08 NOTE — Telephone Encounter (Signed)
 Pt advised.

## 2024-01-30 ENCOUNTER — Other Ambulatory Visit (HOSPITAL_COMMUNITY): Payer: Self-pay

## 2024-01-30 ENCOUNTER — Ambulatory Visit: Payer: Medicare PPO | Admitting: Family Medicine

## 2024-01-30 ENCOUNTER — Other Ambulatory Visit: Payer: Self-pay | Admitting: Family Medicine

## 2024-01-30 MED ORDER — OXYCODONE HCL 10 MG PO TABS
ORAL_TABLET | ORAL | 0 refills | Status: DC
  Filled 2024-01-30: qty 90, 23d supply, fill #0

## 2024-01-30 NOTE — Telephone Encounter (Signed)
 Requesting: Lorazepam Contract: 01/16/22 UDS: 08/01/23 Last Visit: 10/31/23 Next Visit: 02/13/24 Last Refill: 08/01/23 (60,5)  Please Advise. Med pending

## 2024-01-30 NOTE — Telephone Encounter (Signed)
 Requesting: oxycodone Contract: 01/16/22 UDS: 08/01/23 Last Visit: 10/31/23 Next Visit: 02/13/24 Last Refill: 12/12/23 (90,0)  Please Advise. Med pending

## 2024-02-11 ENCOUNTER — Encounter: Payer: Self-pay | Admitting: Family Medicine

## 2024-02-12 DIAGNOSIS — K219 Gastro-esophageal reflux disease without esophagitis: Secondary | ICD-10-CM | POA: Diagnosis not present

## 2024-02-12 DIAGNOSIS — M79643 Pain in unspecified hand: Secondary | ICD-10-CM | POA: Diagnosis not present

## 2024-02-12 DIAGNOSIS — M79672 Pain in left foot: Secondary | ICD-10-CM | POA: Diagnosis not present

## 2024-02-12 DIAGNOSIS — R748 Abnormal levels of other serum enzymes: Secondary | ICD-10-CM | POA: Diagnosis not present

## 2024-02-12 DIAGNOSIS — M79642 Pain in left hand: Secondary | ICD-10-CM | POA: Diagnosis not present

## 2024-02-12 DIAGNOSIS — M1990 Unspecified osteoarthritis, unspecified site: Secondary | ICD-10-CM | POA: Diagnosis not present

## 2024-02-12 DIAGNOSIS — M549 Dorsalgia, unspecified: Secondary | ICD-10-CM | POA: Diagnosis not present

## 2024-02-12 DIAGNOSIS — M79671 Pain in right foot: Secondary | ICD-10-CM | POA: Diagnosis not present

## 2024-02-12 DIAGNOSIS — M79641 Pain in right hand: Secondary | ICD-10-CM | POA: Diagnosis not present

## 2024-02-12 DIAGNOSIS — M0609 Rheumatoid arthritis without rheumatoid factor, multiple sites: Secondary | ICD-10-CM | POA: Diagnosis not present

## 2024-02-12 DIAGNOSIS — Z79899 Other long term (current) drug therapy: Secondary | ICD-10-CM | POA: Diagnosis not present

## 2024-02-12 DIAGNOSIS — E669 Obesity, unspecified: Secondary | ICD-10-CM | POA: Diagnosis not present

## 2024-02-12 DIAGNOSIS — M810 Age-related osteoporosis without current pathological fracture: Secondary | ICD-10-CM | POA: Diagnosis not present

## 2024-02-13 ENCOUNTER — Ambulatory Visit: Admitting: Family Medicine

## 2024-02-13 ENCOUNTER — Encounter: Payer: Self-pay | Admitting: Family Medicine

## 2024-02-13 VITALS — BP 132/79 | HR 84 | Ht 64.0 in | Wt 193.0 lb

## 2024-02-13 DIAGNOSIS — M170 Bilateral primary osteoarthritis of knee: Secondary | ICD-10-CM | POA: Diagnosis not present

## 2024-02-13 DIAGNOSIS — I10 Essential (primary) hypertension: Secondary | ICD-10-CM | POA: Diagnosis not present

## 2024-02-13 DIAGNOSIS — Z79899 Other long term (current) drug therapy: Secondary | ICD-10-CM | POA: Diagnosis not present

## 2024-02-13 DIAGNOSIS — E119 Type 2 diabetes mellitus without complications: Secondary | ICD-10-CM | POA: Diagnosis not present

## 2024-02-13 DIAGNOSIS — M545 Low back pain, unspecified: Secondary | ICD-10-CM | POA: Diagnosis not present

## 2024-02-13 DIAGNOSIS — M25562 Pain in left knee: Secondary | ICD-10-CM

## 2024-02-13 DIAGNOSIS — G8929 Other chronic pain: Secondary | ICD-10-CM

## 2024-02-13 DIAGNOSIS — M25561 Pain in right knee: Secondary | ICD-10-CM

## 2024-02-13 DIAGNOSIS — Z1231 Encounter for screening mammogram for malignant neoplasm of breast: Secondary | ICD-10-CM

## 2024-02-13 DIAGNOSIS — G894 Chronic pain syndrome: Secondary | ICD-10-CM

## 2024-02-13 DIAGNOSIS — Z7984 Long term (current) use of oral hypoglycemic drugs: Secondary | ICD-10-CM | POA: Diagnosis not present

## 2024-02-13 LAB — POCT GLYCOSYLATED HEMOGLOBIN (HGB A1C)
HbA1c POC (<> result, manual entry): 5.5 % (ref 4.0–5.6)
HbA1c, POC (controlled diabetic range): 5.5 % (ref 0.0–7.0)
HbA1c, POC (prediabetic range): 5.5 % — AB (ref 5.7–6.4)
Hemoglobin A1C: 5.5 % (ref 4.0–5.6)

## 2024-02-13 MED ORDER — CYCLOBENZAPRINE HCL 10 MG PO TABS: 10.0000 mg | ORAL_TABLET | Freq: Three times a day (TID) | ORAL | 3 refills | Status: AC | PRN

## 2024-02-13 NOTE — Progress Notes (Signed)
 OFFICE VISIT  02/13/2024  CC:  Chief Complaint  Patient presents with   Medical Management of Chronic Issues    Patient is a 68 y.o. female who presents for 86-month follow-up chronic pain syndrome, HTN, and diabetes. A/P as of last visit: "#1 diabetes without complication, very well-controlled on metformin  500 mg twice a day. POC Hba1c today is 5.6%. Decrease metformin  to 500 mg only once daily.   2.  Chronic pain syndrome. Chronic bilateral knee pain and low back pain without sciatica. All stable.  Knee is more stable since having replacement surgery a couple months ago.  As she rehabs this she still has pretty significant pain.  She will remain on the same pain medication regimen. Oxycodone  5 mg, 1-2 3 times daily as needed, #180--> prescription sent today.   3.  GAD. She is doing well long-term on lorazepam  1 mg twice daily.   Reviewed CBC with differential and complete metabolic panel from her rheumatology follow-up visit on 09/27/2023 and these were all normal."  INTERIM HX: Doing pretty well.  Bone density scan at her rheumatologist yesterday showed bad osteoporosis.  Patient states she is being set up to get an anabolic osteoporosis medication.  Pain control is adequate. Indication for chronic opioid: chronic bilat LBP w/out sciatica, and chronic pain from bilat osteoarthritis knees.  Also LBP and bilat shoulder pain.  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  She remains on Humira and is doing well on this.  She still takes 1-2 oxycodone  3 times a day and this sufficiently helps control her pain to allow for maximum functioning and quality of life.  NSAID GI intolerance.  PMP AWARE reviewed today: most recent rx for oxycodone  10 mg was filled 01/30/2024, # 90, rx by me. Most recent lorazepam  prescription was filled 02/01/2024, #60, prescription by me. No red flags.  Past Medical History:  Diagnosis Date   Allergy    Anxiety    Aortic valve stenosis     "very mild"   Atypical chest pain 2007; 2015   cardiolyte neg, echo nl, cath showed mild/nonobstructive LAD disease   Bilateral primary osteoarthritis of knee 06/30/2012   Right >L.  Right unicompartmental knee arthroplasty 10/25/18   CAD (coronary artery disease)    Chronic LBP    Multiple surgeries   COPD (chronic obstructive pulmonary disease) (HCC)    COVID-19 virus infection 06/02/2019   Eval at [facility] ED and d/c'd home.  Admitted 06/12/19 with hypoxic RF and bilat infiltrates.   DDD (degenerative disc disease)    spinal stenosis   Diabetes mellitus without complication (HCC)    04/2023 a1c 7.2%   Elevated liver enzymes    2021 after starting methotrexate. 2022->rheum d/c'd her methotrexate   Her abd u/s 12/05/20 showed fatty liver, o/w normal.   Fatty liver    11/2020 liver u/s   Gastritis    H pylori NEG on EGD 02/2021   GERD (gastroesophageal reflux disease)    History of adenomatous polyp of colon 06/2021   2022, recall 5 yrs   History of GI bleed    NSAIDS   Hyperlipidemia    mixed   Microscopic hematuria    full w/ u unrevealing X 2   Normal nuclear stress test 11/11 and 06/2014   negative, EF normal   Oxygen  deficiency    Just At night   Palpitations 2006   48H holter neg   Positive occult stool blood test    iFOB positive 04/2021->colonoscopy showed  multiple adenomatous polyps and nonbleeding int hem.   RBBB (right bundle branch block)    Recurrent UTI    Seronegative rheumatoid arthritis (HCC) 10/2019   multiple sites->pred started, methotrex started 10/2019; rheum to add humira as of 03/2020   Tobacco dependence in remission    Quit fall 2015.      Past Surgical History:  Procedure Laterality Date   ABDOMINAL HYSTERECTOMY  1997   DUB   APPENDECTOMY  1984   BIOPSY  02/14/2021   Procedure: BIOPSY;  Surgeon: Vinetta Greening, DO;  Location: [facility];  Service: Endoscopy;;   CARDIAC CATHETERIZATION  10/09/2005   no CAD, mildly elevated LVEDP, normal LV  function (Dr. Dina Francisco)   CARDIAC CATHETERIZATION N/A 05/16/2015   Mild non-obstructive CAD--med mgmt.  Procedure: Left Heart Cath and Coronary Angiography;  Surgeon: Hazle Lites, MD;  Location: [facility] INVASIVE CV LAB;  Service: Cardiovascular;  Laterality: N/A;   CARDIOVASCULAR STRESS TEST  06/2014   normal lexiscan  NST   CARDIOVASCULAR STRESS TEST  2006   persantine - no ischemia, low risk    COLONOSCOPY W/ POLYPECTOMY  06/12/2021   multiple adenomas--recall 3-5 yrs   COLONOSCOPY WITH PROPOFOL  N/A 06/12/2021   Procedure: COLONOSCOPY WITH PROPOFOL ;  Surgeon: Vinetta Greening, DO;  Location: [facility];  Service: Endoscopy;  Laterality: N/A;  8:30am   ESOPHAGOGASTRODUODENOSCOPY  02/14/2021   Gastritis ( H pylori NEG) w/out bleeding   ESOPHAGOGASTRODUODENOSCOPY (EGD) WITH PROPOFOL  N/A 02/14/2021   Procedure: ESOPHAGOGASTRODUODENOSCOPY (EGD) WITH PROPOFOL ;  Surgeon: Vinetta Greening, DO;  Location: [facility];  Service: Endoscopy;  Laterality: N/A;  AM   JOINT REPLACEMENT  11-05-2017   Partial right knee replacement   KNEE ARTHROSCOPY Bilateral    left wrist ganglion cyst excision  2010   LUMBAR SPINE SURGERY  1993   right iliac crest bone graft+metal instrumentation; 2005 metal removal and decompression, 2011 lumbar decompression 4-11, then stabilization/ instrumentation done 09-19-10: L2,L3, L5 left and L2 , L3, L4 right pedical remnant L4 left embedded. Left iliac crest bone graft-- Dr Jade Maxon SPINE SURGERY  02/10/2016   Dr. Basilio Both: lumbar decompression, instrumentation removal, and fusion exploration--HELPED her a lot, esp her radicular leg pains.   OOPHORECTOMY Right    cyst   PARTIAL KNEE ARTHROPLASTY Right 11/04/2018   Procedure: UNICOMPARTMENTAL KNEE;  Surgeon: Saundra Curl, MD;  Location: [facility];  Service: Orthopedics;  Laterality: Right;  Adductor Block   POLYPECTOMY  06/12/2021   Procedure: POLYPECTOMY;  Surgeon: Vinetta Greening, DO;  Location: [facility];  Service: Endoscopy;;   SPINE SURGERY  05-24-1991   Dr Basilio Both surgeon [facility]. 2 level spinal fusion. 09-22-1998, 06-21-2004 took plates and screws out also got bone off of nerves. 2012 took out plates and screws and got bones off of nerves. 2017 2 level spinal fusion and scraped bones away from nerves.   TONSILLECTOMY  68 yrs old   TOTAL KNEE ARTHROPLASTY Left 09/03/2023   Procedure: TOTAL KNEE ARTHROPLASTY;  Surgeon: Saundra Curl, MD;  Location: [facility];  Service: Orthopedics;  Laterality: Left;   TRANSTHORACIC ECHOCARDIOGRAM  2006   EF=>55%, trace MR, mild TR, trace AV regurg, trace pulm valve regurg    TUBAL LIGATION      Outpatient Medications Prior to Visit  Medication Sig Dispense Refill   acetaminophen  (TYLENOL ) 500 MG tablet Take 500 mg by mouth every 6 (six) hours as needed for fever.  Adalimumab (HUMIRA) 40 MG/0.8ML PSKT Inject 40 mg into the skin every 14 (fourteen) days.     clopidogrel  (PLAVIX ) 75 MG tablet TAKE 1 TABLET(75 MG) BY MOUTH DAILY 90 tablet 3   cyclobenzaprine  (FLEXERIL ) 10 MG tablet TAKE 1 TABLET(10 MG) BY MOUTH EVERY 8 HOURS AS NEEDED FOR MUSCLE SPASMS 30 tablet 2   fenofibrate  160 MG tablet Take 1 tablet (160 mg total) by mouth daily. with food 90 tablet 3   folic acid  (FOLVITE ) 1 MG tablet Take 1 mg by mouth daily.      lisinopril  (ZESTRIL ) 5 MG tablet TAKE 1 TABLET(5 MG) BY MOUTH DAILY 90 tablet 3   LORazepam  (ATIVAN ) 1 MG tablet TAKE 1 TABLET(1 MG) BY MOUTH TWICE DAILY 60 tablet 5   metFORMIN  (GLUCOPHAGE ) 500 MG tablet 1 tab po qd     nitroGLYCERIN  (NITROSTAT ) 0.4 MG SL tablet Place 1 tablet (0.4 mg total) under the tongue every 5 (five) minutes as needed for chest pain (MAX 3 doses.). 10 tablet 1   Oxycodone  HCl 10 MG TABS Take 1/2-1 tablet by mouth every 6 hours as needed. 90 tablet 0   OXYGEN  Inhale 2.5 L into the lungs at bedtime.     pantoprazole  (PROTONIX ) 40 MG tablet TAKE 1 TABLET(40 MG) BY MOUTH DAILY 90 tablet 1   rosuvastatin  (CRESTOR )  40 MG tablet TAKE 1 TABLET(40 MG) BY MOUTH DAILY 90 tablet 1   Tiotropium Bromide-Olodaterol (STIOLTO RESPIMAT ) 2.5-2.5 MCG/ACT AERS Inhale 2 puffs into the lungs daily. 1 each 11   albuterol  (VENTOLIN  HFA) 108 (90 Base) MCG/ACT inhaler Inhale 2 puffs into the lungs every 4 (four) hours as needed for wheezing or shortness of breath (bronchitis). Reported on 03/12/2016 (Patient not taking: Reported on 02/13/2024) 18 g 1   No facility-administered medications prior to visit.    Allergies  Allergen Reactions   Penicillins Anaphylaxis and Swelling    DID THE REACTION INVOLVE: Swelling of the face/tongue/throat, SOB, or low BP? Yes Sudden or severe rash/hives, skin peeling, or the inside of the mouth or nose? Yes Did it require medical treatment? Yes When did it last happen? 68 years old If all above answers are "NO", may proceed with cephalosporin use.    Aspirin  Other (See Comments)     GI Bleed   Beta Adrenergic Blockers Other (See Comments)    REACTION: decreased libido   Citalopram  Nausea And Vomiting   Nsaids Other (See Comments)     GI Upset   Other Hives and Itching    Walnuts    Plaquenil [Hydroxychloroquine Sulfate] Other (See Comments)    Tachycardia   Prochlorperazine Edisylate Other (See Comments)     Stroke like symptoms   Sulfonamide Derivatives Other (See Comments)    Unknown allergic reaction   Amitriptyline Hcl Palpitations and Other (See Comments)     increased heart rate    Review of Systems As per HPI  PE:    02/13/2024    2:43 PM 10/31/2023    1:58 PM 09/04/2023    1:25 PM  Vitals with BMI  Height 5\' 4"     Weight 193 lbs 190 lbs 6 oz   BMI 33.11    Systolic 132 125 409  Diastolic 79 77 50  Pulse 84 98 94     Physical Exam  Gen: Alert, well appearing.  Patient is oriented to person, place, time, and situation. AFFECT: pleasant, lucid thought and speech. CV: RRR, no m/r/g.   LUNGS: CTA bilat, nonlabored resps,  good aeration in all lung  fields. EXT: no clubbing or cyanosis.  no edema.    LABS:  Last CBC Lab Results  Component Value Date   WBC 10.2 09/26/2023   HGB 12.3 09/26/2023   HCT 38 09/26/2023   MCV 94.6 08/22/2023   MCH 31.2 08/22/2023   RDW 12.7 08/22/2023   PLT 217 09/26/2023   Last metabolic panel Lab Results  Component Value Date   GLUCOSE 127 (H) 08/22/2023   NA 144 09/26/2023   K 4.7 09/26/2023   CL 103 09/26/2023   CO2 25 (A) 09/26/2023   BUN 13 09/26/2023   CREATININE 0.8 09/26/2023   EGFR 86 09/26/2023   CALCIUM  10.1 09/26/2023   PHOS 3.8 07/19/2022   PROT 7.4 08/22/2023   ALBUMIN 4.4 09/26/2023   LABGLOB 2.5 01/05/2021   AGRATIO 1.8 01/05/2021   BILITOT 0.9 08/22/2023   ALKPHOS 97 09/26/2023   AST 19 09/26/2023   ALT 10 09/26/2023   ANIONGAP 11 08/22/2023   Lab Results  Component Value Date   PTH 19 07/19/2022   CALCIUM  10.1 09/26/2023   PHOS 3.8 07/19/2022   Last lipids Lab Results  Component Value Date   CHOL 104 05/02/2023   HDL 38.90 (L) 05/02/2023   LDLCALC 44 05/02/2023   LDLDIRECT 172.2 07/14/2014   TRIG 108.0 05/02/2023   CHOLHDL 3 05/02/2023   Last hemoglobin A1c Lab Results  Component Value Date   HGBA1C 5.6 10/31/2023   HGBA1C 5.6 10/31/2023   HGBA1C 5.6 (A) 10/31/2023   HGBA1C 5.6 10/31/2023   Last thyroid  functions Lab Results  Component Value Date   TSH 0.42 09/24/2019   IMPRESSION AND PLAN:  #1 diabetes without complication. Doing great on diet and metformin  500 mg once a day. Hemoglobin A1c 5.5% today. We will do trial off metformin .  #2 hypertension, well-controlled on lisinopril  5 mg daily.  #3 chronic pain syndrome. Stable. Chronic bilat LBP w/out sciatica, and chronic pain from bilat osteoarthritis knees.  Also LBP and bilat shoulder pain.  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  She remains on Humira and is doing well on this.  She still takes 1-2 oxycodone  3 times a day and this sufficiently helps control her  pain to allow for maximum functioning and quality of life.  NSAID GI intolerance.   An After Visit Summary was printed and given to the patient.  FOLLOW UP: 3 months CPE  Signed:  Arletha Lady, MD           02/13/2024

## 2024-03-02 ENCOUNTER — Other Ambulatory Visit: Payer: Self-pay | Admitting: Family Medicine

## 2024-03-03 ENCOUNTER — Other Ambulatory Visit (HOSPITAL_COMMUNITY): Payer: Self-pay

## 2024-03-03 NOTE — Telephone Encounter (Signed)
 Requesting: oxycodone  Contract: 01/16/22 UDS: 08/01/23 Last Visit: 02/13/24 Next Visit: 05/14/24 Last Refill: 01/30/24 (90,0)  Please Advise. Med pending

## 2024-03-04 ENCOUNTER — Other Ambulatory Visit (HOSPITAL_COMMUNITY): Payer: Self-pay

## 2024-03-04 MED ORDER — OXYCODONE HCL 10 MG PO TABS
ORAL_TABLET | ORAL | 0 refills | Status: DC
  Filled 2024-03-04: qty 90, 23d supply, fill #0

## 2024-03-19 DIAGNOSIS — M810 Age-related osteoporosis without current pathological fracture: Secondary | ICD-10-CM | POA: Diagnosis not present

## 2024-04-01 ENCOUNTER — Other Ambulatory Visit: Payer: Self-pay | Admitting: Family Medicine

## 2024-04-02 ENCOUNTER — Other Ambulatory Visit (HOSPITAL_COMMUNITY): Payer: Self-pay

## 2024-04-02 MED ORDER — OXYCODONE HCL 10 MG PO TABS
ORAL_TABLET | ORAL | 0 refills | Status: DC
  Filled 2024-04-02: qty 90, 23d supply, fill #0

## 2024-04-02 NOTE — Telephone Encounter (Signed)
 Requesting: Oxycodone  Contract: 3/147/23 UDS: 08/01/23 Last Visit: 02/13/24 Next Visit: 05/14/24 Last Refill: 03/04/24 (90,0)  Please Advise. Rx pending

## 2024-04-03 ENCOUNTER — Other Ambulatory Visit (HOSPITAL_COMMUNITY): Payer: Self-pay

## 2024-04-14 DIAGNOSIS — R092 Respiratory arrest: Secondary | ICD-10-CM | POA: Diagnosis not present

## 2024-04-14 DIAGNOSIS — J441 Chronic obstructive pulmonary disease with (acute) exacerbation: Secondary | ICD-10-CM | POA: Diagnosis not present

## 2024-04-22 ENCOUNTER — Telehealth: Payer: Self-pay

## 2024-04-22 NOTE — Telephone Encounter (Signed)
 Communication  Reason for CRM: Patient called requesting a call back from [facility] pertaining to her AWV. Cb#[phone]            -----------------------------------------------------------------------            FYI.

## 2024-05-02 ENCOUNTER — Other Ambulatory Visit: Payer: Self-pay | Admitting: Family Medicine

## 2024-05-04 MED ORDER — OXYCODONE HCL 10 MG PO TABS: ORAL_TABLET | ORAL | 0 refills | Status: DC

## 2024-05-04 NOTE — Telephone Encounter (Signed)
 Requesting: oxycodone  Contract: 01/16/22 UDS: 08/01/23 Last Visit: 02/13/24 Next Visit: 05/14/24 Last Refill: 04/02/24 (90,0)  Please Advise. Rx pending

## 2024-05-14 ENCOUNTER — Encounter: Payer: Self-pay | Admitting: Family Medicine

## 2024-05-14 ENCOUNTER — Ambulatory Visit: Admitting: Family Medicine

## 2024-05-14 VITALS — BP 121/75 | HR 94 | Temp 97.0°F | Ht 64.0 in | Wt 195.8 lb

## 2024-05-14 DIAGNOSIS — M25561 Pain in right knee: Secondary | ICD-10-CM | POA: Diagnosis not present

## 2024-05-14 DIAGNOSIS — R092 Respiratory arrest: Secondary | ICD-10-CM | POA: Diagnosis not present

## 2024-05-14 DIAGNOSIS — M25562 Pain in left knee: Secondary | ICD-10-CM

## 2024-05-14 DIAGNOSIS — I10 Essential (primary) hypertension: Secondary | ICD-10-CM

## 2024-05-14 DIAGNOSIS — G894 Chronic pain syndrome: Secondary | ICD-10-CM

## 2024-05-14 DIAGNOSIS — M545 Low back pain, unspecified: Secondary | ICD-10-CM

## 2024-05-14 DIAGNOSIS — J441 Chronic obstructive pulmonary disease with (acute) exacerbation: Secondary | ICD-10-CM | POA: Diagnosis not present

## 2024-05-14 DIAGNOSIS — E7800 Pure hypercholesterolemia, unspecified: Secondary | ICD-10-CM

## 2024-05-14 DIAGNOSIS — E119 Type 2 diabetes mellitus without complications: Secondary | ICD-10-CM | POA: Diagnosis not present

## 2024-05-14 DIAGNOSIS — Z7984 Long term (current) use of oral hypoglycemic drugs: Secondary | ICD-10-CM

## 2024-05-14 DIAGNOSIS — G8929 Other chronic pain: Secondary | ICD-10-CM

## 2024-05-14 DIAGNOSIS — Z0000 Encounter for general adult medical examination without abnormal findings: Secondary | ICD-10-CM

## 2024-05-14 LAB — COMPREHENSIVE METABOLIC PANEL WITH GFR
ALT: 24 U/L (ref 0–35)
AST: 28 U/L (ref 0–37)
Albumin: 4.8 g/dL (ref 3.5–5.2)
Alkaline Phosphatase: 53 U/L (ref 39–117)
BUN: 19 mg/dL (ref 6–23)
CO2: 33 meq/L — ABNORMAL HIGH (ref 19–32)
Calcium: 10.5 mg/dL (ref 8.4–10.5)
Chloride: 101 meq/L (ref 96–112)
Creatinine, Ser: 0.79 mg/dL (ref 0.40–1.20)
GFR: 77.12 mL/min (ref >60.00)
Glucose, Bld: 95 mg/dL (ref 70–99)
Potassium: 4.1 meq/L (ref 3.5–5.1)
Sodium: 141 meq/L (ref 135–145)
Total Bilirubin: 0.7 mg/dL (ref 0.2–1.2)
Total Protein: 7.5 g/dL (ref 6.0–8.3)

## 2024-05-14 LAB — CBC WITH DIFFERENTIAL/PLATELET
Basophils Absolute: 0.1 K/uL (ref 0.0–0.1)
Basophils Relative: 0.5 % (ref 0.0–3.0)
Eosinophils Absolute: 0.2 K/uL (ref 0.0–0.7)
Eosinophils Relative: 1.8 % (ref 0.0–5.0)
HCT: 41.9 % (ref 36.0–46.0)
Hemoglobin: 13.8 g/dL (ref 12.0–15.0)
Lymphocytes Relative: 43.9 % (ref 12.0–46.0)
Lymphs Abs: 4.7 K/uL — ABNORMAL HIGH (ref 0.7–4.0)
MCHC: 32.9 g/dL (ref 30.0–36.0)
MCV: 91.1 fl (ref 78.0–100.0)
Monocytes Absolute: 0.7 K/uL (ref 0.1–1.0)
Monocytes Relative: 6.6 % (ref 3.0–12.0)
Neutro Abs: 5 K/uL (ref 1.4–7.7)
Neutrophils Relative %: 47.2 % (ref 43.0–77.0)
Platelets: 172 K/uL (ref 150.0–400.0)
RBC: 4.6 Mil/uL (ref 3.87–5.11)
RDW: 14.3 % (ref 11.5–15.5)
WBC: 10.7 K/uL — ABNORMAL HIGH (ref 4.0–10.5)

## 2024-05-14 LAB — LIPID PANEL
Cholesterol: 141 mg/dL (ref 0–200)
HDL: 52.2 mg/dL (ref >39.00)
LDL Cholesterol: 63 mg/dL (ref 0–99)
NonHDL: 88.81
Total CHOL/HDL Ratio: 3
Triglycerides: 131 mg/dL (ref 0.0–149.0)
VLDL: 26.2 mg/dL (ref 0.0–40.0)

## 2024-05-14 LAB — POCT GLYCOSYLATED HEMOGLOBIN (HGB A1C)
HbA1c POC (<> result, manual entry): 5.8 % (ref 4.0–5.6)
HbA1c, POC (controlled diabetic range): 5.8 % (ref 0.0–7.0)
HbA1c, POC (prediabetic range): 5.8 % (ref 5.7–6.4)
Hemoglobin A1C: 5.8 % — AB (ref 4.0–5.6)

## 2024-05-14 LAB — MICROALBUMIN / CREATININE URINE RATIO
Creatinine,U: 112.7 mg/dL
Microalb Creat Ratio: 18.8 mg/g (ref 0.0–30.0)
Microalb, Ur: 2.1 mg/dL — ABNORMAL HIGH (ref 0.0–1.9)

## 2024-05-14 MED ORDER — TETANUS-DIPHTH-ACELL PERTUSSIS 5-2.5-18.5 LF-MCG/0.5 IM SUSP: 0.5000 mL | Freq: Once | INTRAMUSCULAR | 0 refills | Status: AC

## 2024-05-14 MED ORDER — ZOSTER VAC RECOMB ADJUVANTED 50 MCG/0.5ML IM SUSR: 0.5000 mL | Freq: Once | INTRAMUSCULAR | 0 refills | Status: AC

## 2024-05-14 NOTE — Patient Instructions (Signed)

## 2024-05-14 NOTE — Progress Notes (Signed)
 Office Note 05/14/2024  CC:  Chief Complaint  Patient presents with   Annual Exam    Pt is fasting    HPI:  Patient is a 68 y.o. female who is here for annual health maintenance exam and 23-month follow-up chronic pain syndrome, hypertension, and diabetes. A/P as of last visit: #1 diabetes without complication. Doing great on diet and metformin  500 mg once a day. Hemoglobin A1c 5.5% today. We will do trial off metformin .   #2 hypertension, well-controlled on lisinopril  5 mg daily.   #3 chronic pain syndrome. Stable. Chronic bilat LBP w/out sciatica, and chronic pain from bilat osteoarthritis knees.  Also LBP and bilat shoulder pain.  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  She remains on Humira and is doing well on this.  She still takes 1-2 oxycodone  3 times a day and this sufficiently helps control her pain to allow for maximum functioning and quality of life.  NSAID GI intolerance.   INTERIM HX: Murial is doing fine.   Pain well-controlled on current regimen. Indication for chronic opioid: chronic bilat LBP w/out sciatica, and chronic pain from bilat osteoarthritis knees.  Also LBP and bilat shoulder pain.  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  She remains on Humira and is doing well on this.  She still takes 1-2 oxycodone  3 times a day and this sufficiently helps control her pain to allow for maximum functioning and quality of life.  NSAID GI intolerance.  PMP AWARE reviewed today: most recent rx for oxycodone  10 mg was filled 05/05/2024, # 90, rx by me.  Most recent lorazepam  1 mg prescription was filled 05/04/2024, #60, prescription by me. No red flags.   Past Medical History:  Diagnosis Date   Allergy    Anxiety    Aortic valve stenosis    very mild   Atypical chest pain 2007; 2015   cardiolyte neg, echo nl, cath showed mild/nonobstructive LAD disease   Bilateral primary osteoarthritis of knee 06/30/2012   Right  >L.  Right unicompartmental knee arthroplasty 10/25/18   CAD (coronary artery disease)    Chronic LBP    Multiple surgeries   COPD (chronic obstructive pulmonary disease) (HCC)    COVID-19 virus infection 06/02/2019   Eval at [facility] ED and d/c'd home.  Admitted 06/12/19 with hypoxic RF and bilat infiltrates.   DDD (degenerative disc disease)    spinal stenosis   Diabetes mellitus without complication (HCC)    04/2023 a1c 7.2%   Elevated liver enzymes    2021 after starting methotrexate. 2022->rheum d/c'd her methotrexate   Her abd u/s 12/05/20 showed fatty liver, o/w normal.   Fatty liver    11/2020 liver u/s   Gastritis    H pylori NEG on EGD 02/2021   GERD (gastroesophageal reflux disease)    History of adenomatous polyp of colon 06/2021   2022, recall 5 yrs   History of GI bleed    NSAIDS   Hyperlipidemia    mixed   Microscopic hematuria    full w/ u unrevealing X 2   Normal nuclear stress test 11/11 and 06/2014   negative, EF normal   Oxygen  deficiency    Just At night   Palpitations 2006   48H holter neg   Positive occult stool blood test    iFOB positive 04/2021->colonoscopy showed multiple adenomatous polyps and nonbleeding int hem.   RBBB (right bundle branch block)    Recurrent UTI    Seronegative rheumatoid arthritis (HCC) 10/2019  multiple sites->pred started, methotrex started 10/2019; rheum to add humira as of 03/2020   Tobacco dependence in remission    Quit fall 2015.      Past Surgical History:  Procedure Laterality Date   ABDOMINAL HYSTERECTOMY  1997   DUB   APPENDECTOMY  1984   BIOPSY  02/14/2021   Procedure: BIOPSY;  Surgeon: Cindie Carlin POUR, DO;  Location: [facility];  Service: Endoscopy;;   CARDIAC CATHETERIZATION  10/09/2005   no CAD, mildly elevated LVEDP, normal LV function (Dr. FABIENE Hasten)   CARDIAC CATHETERIZATION N/A 05/16/2015   Mild non-obstructive CAD--med mgmt.  Procedure: Left Heart Cath and Coronary Angiography;  Surgeon: Vinie JAYSON Maxcy,  MD;  Location: [facility] INVASIVE CV LAB;  Service: Cardiovascular;  Laterality: N/A;   CARDIOVASCULAR STRESS TEST  06/2014   normal lexiscan  NST   CARDIOVASCULAR STRESS TEST  2006   persantine - no ischemia, low risk    COLONOSCOPY W/ POLYPECTOMY  06/12/2021   multiple adenomas--recall 3-5 yrs   COLONOSCOPY WITH PROPOFOL  N/A 06/12/2021   Procedure: COLONOSCOPY WITH PROPOFOL ;  Surgeon: Cindie Carlin POUR, DO;  Location: [facility];  Service: Endoscopy;  Laterality: N/A;  8:30am   ESOPHAGOGASTRODUODENOSCOPY  02/14/2021   Gastritis ( H pylori NEG) w/out bleeding   ESOPHAGOGASTRODUODENOSCOPY (EGD) WITH PROPOFOL  N/A 02/14/2021   Procedure: ESOPHAGOGASTRODUODENOSCOPY (EGD) WITH PROPOFOL ;  Surgeon: Cindie Carlin POUR, DO;  Location: [facility];  Service: Endoscopy;  Laterality: N/A;  AM   JOINT REPLACEMENT  11-05-2017   Partial right knee replacement   KNEE ARTHROSCOPY Bilateral    left wrist ganglion cyst excision  2010   LUMBAR SPINE SURGERY  1993   right iliac crest bone graft+metal instrumentation; 2005 metal removal and decompression, 2011 lumbar decompression 4-11, then stabilization/ instrumentation done 09-19-10: L2,L3, L5 left and L2 , L3, L4 right pedical remnant L4 left embedded. Left iliac crest bone graft-- Dr Pascal [facility]  02/10/2016   Dr. Pascal: lumbar decompression, instrumentation removal, and fusion exploration--HELPED her a lot, esp her radicular leg pains.   OOPHORECTOMY Right    cyst   PARTIAL KNEE ARTHROPLASTY Right 11/04/2018   Procedure: UNICOMPARTMENTAL KNEE;  Surgeon: Beverley Evalene BIRCH, MD;  Location: [facility];  Service: Orthopedics;  Laterality: Right;  Adductor Block   POLYPECTOMY  06/12/2021   Procedure: POLYPECTOMY;  Surgeon: Cindie Carlin POUR, DO;  Location: [facility];  Service: Endoscopy;;   SPINE SURGERY  05-24-1991   Dr Pascal surgeon [facility]. 2 level spinal fusion. 09-22-1998, 06-21-2004 took plates and screws out also got bone off of nerves.  2012 took out plates and screws and got bones off of nerves. 2017 2 level spinal fusion and scraped bones away from nerves.   TONSILLECTOMY  68 yrs old   TOTAL KNEE ARTHROPLASTY Left 09/03/2023   Procedure: TOTAL KNEE ARTHROPLASTY;  Surgeon: Beverley Evalene BIRCH, MD;  Location: [facility];  Service: Orthopedics;  Laterality: Left;   TRANSTHORACIC ECHOCARDIOGRAM  2006   EF=>55%, trace MR, mild TR, trace AV regurg, trace pulm valve regurg    TUBAL LIGATION      Family History  Problem Relation Age of Onset   Heart Problems Mother        and thyroid  problems   Hyperlipidemia Mother    Diabetes Mother    Breast cancer Mother    Arthritis Mother    Heart failure Father        CHF, heart attack, diabetes, hyperlipidemia  Alcohol abuse Father    Heart disease Father    Heart disease Brother        back problems   Hypertension Brother    Alcohol abuse Brother    Varicose Veins Daughter    Kidney failure Maternal Grandmother    Stroke Maternal Grandfather    Cancer Paternal Grandmother        mouth - snuff   Heart attack Paternal Grandfather        stroke, HTN   Cancer Brother    Hyperlipidemia Brother    Heart attack Brother    Cancer Brother    Miscarriages / Stillbirths Sister    Coronary artery disease Neg Hx     Social History   Socioeconomic History   Marital status: Married    Spouse name: Not on file   Number of children: Not on file   Years of education: Not on file   Highest education level: GED or equivalent  Occupational History   Not on file  Tobacco Use   Smoking status: Former    Current packs/day: 0.00    Average packs/day: 1 pack/day for 35.0 years (35.0 ttl pk-yrs)    Types: Cigarettes    Start date: 07/23/1979    Quit date: 07/22/2014    Years since quitting: 9.8   Smokeless tobacco: Never   Tobacco comments:    down to ~2 cigarettes/daily (06/23/14) - Quit Smoking around 07/20/14!  Vaping Use   Vaping status: Never Used  Substance and Sexual Activity    Alcohol use: No   Drug use: No   Sexual activity: Yes    Partners: Male    Birth control/protection: Surgical, Other-see comments    Comment: Hysterectomy  Other Topics Concern   Not on file  Social History Narrative   Not on file   Social Drivers of Health   Financial Resource Strain: Medium Risk (04/17/2024)   Overall Financial Resource Strain (CARDIA)    Difficulty of Paying Living Expenses: Somewhat hard  Food Insecurity: No Food Insecurity (04/17/2024)   Hunger Vital Sign    Worried About Running Out of Food in the Last Year: Never true    Ran Out of Food in the Last Year: Never true  Transportation Needs: No Transportation Needs (04/17/2024)   PRAPARE - Administrator, Civil Service (Medical): No    Lack of Transportation (Non-Medical): No  Physical Activity: Insufficiently Active (04/17/2024)   Exercise Vital Sign    Days of Exercise per Week: 2 days    Minutes of Exercise per Session: 20 min  Stress: No Stress Concern Present (04/17/2024)   [employer] of Occupational Health - Occupational Stress Questionnaire    Feeling of Stress: Only a little  Social Connections: Socially Integrated (04/17/2024)   Social Connection and Isolation Panel    Frequency of Communication with Friends and Family: More than three times a week    Frequency of Social Gatherings with Friends and Family: Twice a week    Attends Religious Services: More than 4 times per year    Active Member of [employer] or Organizations: Yes    Attends Banker Meetings: More than 4 times per year    Marital Status: Married  Catering manager Violence: Not At Risk (09/03/2023)   Humiliation, Afraid, Rape, and Kick questionnaire    Fear of Current or Ex-Partner: No    Emotionally Abused: No    Physically Abused: No    Sexually Abused: No  Outpatient Medications Prior to Visit  Medication Sig Dispense Refill   acetaminophen  (TYLENOL ) 500 MG tablet Take 500 mg by mouth every 6 (six)  hours as needed for fever.     Adalimumab (HUMIRA) 40 MG/0.8ML PSKT Inject 40 mg into the skin every 14 (fourteen) days.     albuterol  (VENTOLIN  HFA) 108 (90 Base) MCG/ACT inhaler Inhale 2 puffs into the lungs every 4 (four) hours as needed for wheezing or shortness of breath (bronchitis). Reported on 03/12/2016 18 g 1   clopidogrel  (PLAVIX ) 75 MG tablet TAKE 1 TABLET(75 MG) BY MOUTH DAILY 90 tablet 3   cyclobenzaprine  (FLEXERIL ) 10 MG tablet Take 1 tablet (10 mg total) by mouth every 8 (eight) hours as needed for muscle spasms. 180 tablet 3   fenofibrate  160 MG tablet Take 1 tablet (160 mg total) by mouth daily. with food 90 tablet 3   lisinopril  (ZESTRIL ) 5 MG tablet TAKE 1 TABLET(5 MG) BY MOUTH DAILY 90 tablet 3   LORazepam  (ATIVAN ) 1 MG tablet TAKE 1 TABLET(1 MG) BY MOUTH TWICE DAILY 60 tablet 5   nitroGLYCERIN  (NITROSTAT ) 0.4 MG SL tablet Place 1 tablet (0.4 mg total) under the tongue every 5 (five) minutes as needed for chest pain (MAX 3 doses.). 10 tablet 1   Oxycodone  HCl 10 MG TABS Take 1/2 - 1 tablet by mouth every 6 hours as needed. 90 tablet 0   OXYGEN  Inhale 2.5 L into the lungs at bedtime.     pantoprazole  (PROTONIX ) 40 MG tablet TAKE 1 TABLET(40 MG) BY MOUTH DAILY 90 tablet 1   rosuvastatin  (CRESTOR ) 40 MG tablet TAKE 1 TABLET(40 MG) BY MOUTH DAILY 90 tablet 1   Tiotropium Bromide-Olodaterol (STIOLTO RESPIMAT ) 2.5-2.5 MCG/ACT AERS Inhale 2 puffs into the lungs daily. 1 each 11   folic acid  (FOLVITE ) 1 MG tablet Take 1 mg by mouth daily.      No facility-administered medications prior to visit.    Allergies  Allergen Reactions   Penicillins Anaphylaxis and Swelling    DID THE REACTION INVOLVE: Swelling of the face/tongue/throat, SOB, or low BP? Yes Sudden or severe rash/hives, skin peeling, or the inside of the mouth or nose? Yes Did it require medical treatment? Yes When did it last happen? 68 years old If all above answers are NO, may proceed with cephalosporin use.     Aspirin  Other (See Comments)     GI Bleed   Beta Adrenergic Blockers Other (See Comments)    REACTION: decreased libido   Citalopram  Nausea And Vomiting   Nsaids Other (See Comments)     GI Upset   Other Hives and Itching    Walnuts    Plaquenil [Hydroxychloroquine Sulfate] Other (See Comments)    Tachycardia   Prochlorperazine Edisylate Other (See Comments)     Stroke like symptoms   Sulfonamide Derivatives Other (See Comments)    Unknown allergic reaction   Amitriptyline Hcl Palpitations and Other (See Comments)     increased heart rate    Review of Systems  Constitutional:  Negative for appetite change, chills, fatigue and fever.  HENT:  Negative for congestion, dental problem, ear pain and sore throat.   Eyes:  Negative for discharge, redness and visual disturbance.  Respiratory:  Negative for cough, chest tightness, shortness of breath and wheezing.   Cardiovascular:  Negative for chest pain, palpitations and leg swelling.  Gastrointestinal:  Negative for abdominal pain, blood in stool, diarrhea, nausea and vomiting.  Genitourinary:  Negative for difficulty urinating, dysuria,  flank pain, frequency, hematuria and urgency.  Musculoskeletal:  Positive for arthralgias (chronic, knees) and back pain (chronic). Negative for joint swelling, myalgias and neck stiffness.  Skin:  Negative for pallor and rash.  Neurological:  Negative for dizziness, speech difficulty, weakness and headaches.  Hematological:  Negative for adenopathy. Does not bruise/bleed easily.  Psychiatric/Behavioral:  Negative for confusion and sleep disturbance. The patient is not nervous/anxious.     PE;    05/14/2024    1:02 PM 02/13/2024    2:43 PM 10/31/2023    1:58 PM  Vitals with BMI  Height 5' 4 5' 4   Weight 195 lbs 13 oz 193 lbs 190 lbs 6 oz  BMI 33.59 33.11   Systolic 121 132 874  Diastolic 75 79 77  Pulse 94 84 98    Gen: Alert, well appearing.  Patient is oriented to person, place, time,  and situation. AFFECT: pleasant, lucid thought and speech. ENT: Ears: EACs clear, normal epithelium.  TMs with good light reflex and landmarks bilaterally.  Eyes: no injection, icteris, swelling, or exudate.  EOMI, PERRLA. Nose: no drainage or turbinate edema/swelling.  No injection or focal lesion.  Mouth: lips without lesion/swelling.  Oral mucosa pink and moist.  Dentition intact and without obvious caries or gingival swelling.  Oropharynx without erythema, exudate, or swelling.  Neck: supple/nontender.  No LAD, mass, or TM.  Carotid pulses 2+ bilaterally, without bruits. CV: RRR, no m/r/g.   LUNGS: CTA bilat, nonlabored resps, good aeration in all lung fields. ABD: soft, NT, ND, BS normal.  No hepatospenomegaly or mass.  No bruits. EXT: no clubbing, cyanosis, or edema.  Musculoskeletal: no joint swelling, erythema, warmth, or tenderness.  ROM of all joints intact. Skin - no sores or suspicious lesions or rashes or color changes Foot exam -  no swelling, tenderness or skin or vascular lesions. Color and temperature is normal. Sensation is intact. Peripheral pulses are palpable. Toenails are normal.  Pertinent labs:  Lab Results  Component Value Date   TSH 0.42 09/24/2019   Lab Results  Component Value Date   WBC 10.2 09/26/2023   HGB 12.3 09/26/2023   HCT 38 09/26/2023   MCV 94.6 08/22/2023   PLT 217 09/26/2023   Lab Results  Component Value Date   CREATININE 0.8 09/26/2023   BUN 13 09/26/2023   NA 144 09/26/2023   K 4.7 09/26/2023   CL 103 09/26/2023   CO2 25 (A) 09/26/2023   Lab Results  Component Value Date   ALT 10 09/26/2023   AST 19 09/26/2023   ALKPHOS 97 09/26/2023   BILITOT 0.9 08/22/2023   Lab Results  Component Value Date   CHOL 104 05/02/2023   Lab Results  Component Value Date   HDL 38.90 (L) 05/02/2023   Lab Results  Component Value Date   LDLCALC 44 05/02/2023   Lab Results  Component Value Date   TRIG 108.0 05/02/2023   Lab Results   Component Value Date   CHOLHDL 3 05/02/2023   Lab Results  Component Value Date   HGBA1C 5.5 02/13/2024   HGBA1C 5.5 02/13/2024   HGBA1C 5.5 (A) 02/13/2024   HGBA1C 5.5 02/13/2024   Lab Results  Component Value Date   IRON 121 01/05/2021   TIBC 333 01/05/2021   FERRITIN 434 (H) 01/05/2021    ASSESSMENT AND PLAN:   No problem-specific Assessment & Plan notes found for this encounter.  1 health maintenance exam: Reviewed age and gender appropriate health maintenance  issues (prudent diet, regular exercise, health risks of tobacco and excessive alcohol, use of seatbelts, fire alarms in home, use of sunscreen).  Also reviewed age and gender appropriate health screening as well as vaccine recommendations. Vaccines: Prescriptions for Tdap and Shingrix  sent to her pharmacy today.  Otherwise UTD Labs: cbc,cmet, hba1c, lipid panel. Cervical ca screening:  no longer needed (hx of hysterectomy for benign dx) Breast ca screening: due for mammogram but she declines. Colon ca screening: recall 2025-2027 timeframe. Osteoporosis: DEXA's per Rheum MD-->  Continue calcium  and vitamin D. Rheum gives her annual Reclast  infusion.  2 diabetes without complication. Doing great on diet alone. Hemoglobin A1c 5.8% today. Feet exam normal today. Urine microalbumin/creatinine today.  3. hypertension, well-controlled on lisinopril  5 mg daily. Electrolytes and creatinine today.  4.  chronic pain syndrome. Stable. Chronic bilat LBP w/out sciatica, and chronic pain from bilat osteoarthritis knees.  Also LBP and bilat shoulder pain.  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  She remains on Humira and is doing well on this.  She still takes 1-2 oxycodone  3 times a day and this sufficiently helps control her pain to allow for maximum functioning and quality of life.  NSAID GI intolerance.   An After Visit Summary was printed and given to the patient.  FOLLOW UP:  No follow-ups on  file.  Signed:  Gerlene Hockey, MD           05/14/2024

## 2024-05-15 ENCOUNTER — Ambulatory Visit: Payer: Self-pay | Admitting: Family Medicine

## 2024-06-03 MED ORDER — OXYCODONE HCL 10 MG PO TABS: ORAL_TABLET | ORAL | 0 refills | Status: DC

## 2024-06-03 NOTE — Telephone Encounter (Signed)
 Requesting: oxycodone  Contract: 01/16/22 UDS: 08/01/23 Last Visit: 05/14/24 Next Visit: 08/19/24 Last Refill: 05/04/24 (90,0)   Please Advise. Rx pending

## 2024-06-03 NOTE — Addendum Note (Signed)
 Addended by: FLETA CARE D on: 06/03/2024 02:10 PM   Modules accepted: Orders

## 2024-06-14 DIAGNOSIS — J441 Chronic obstructive pulmonary disease with (acute) exacerbation: Secondary | ICD-10-CM | POA: Diagnosis not present

## 2024-06-14 DIAGNOSIS — R092 Respiratory arrest: Secondary | ICD-10-CM | POA: Diagnosis not present

## 2024-06-15 DIAGNOSIS — M549 Dorsalgia, unspecified: Secondary | ICD-10-CM | POA: Diagnosis not present

## 2024-06-15 DIAGNOSIS — M810 Age-related osteoporosis without current pathological fracture: Secondary | ICD-10-CM | POA: Diagnosis not present

## 2024-06-15 DIAGNOSIS — Z79899 Other long term (current) drug therapy: Secondary | ICD-10-CM | POA: Diagnosis not present

## 2024-06-15 DIAGNOSIS — M1990 Unspecified osteoarthritis, unspecified site: Secondary | ICD-10-CM | POA: Diagnosis not present

## 2024-06-15 DIAGNOSIS — M0609 Rheumatoid arthritis without rheumatoid factor, multiple sites: Secondary | ICD-10-CM | POA: Diagnosis not present

## 2024-06-15 DIAGNOSIS — R748 Abnormal levels of other serum enzymes: Secondary | ICD-10-CM | POA: Diagnosis not present

## 2024-06-15 DIAGNOSIS — E669 Obesity, unspecified: Secondary | ICD-10-CM | POA: Diagnosis not present

## 2024-06-15 DIAGNOSIS — M79643 Pain in unspecified hand: Secondary | ICD-10-CM | POA: Diagnosis not present

## 2024-06-15 DIAGNOSIS — K219 Gastro-esophageal reflux disease without esophagitis: Secondary | ICD-10-CM | POA: Diagnosis not present

## 2024-07-02 ENCOUNTER — Other Ambulatory Visit: Payer: Self-pay | Admitting: Family Medicine

## 2024-07-02 MED ORDER — OXYCODONE HCL 10 MG PO TABS: ORAL_TABLET | ORAL | 0 refills | Status: DC

## 2024-07-02 NOTE — Telephone Encounter (Signed)
 Copied from CRM #[id number]. Topic: Clinical - Medication Refill >> Jul 02, 2024  9:54 AM Berneda FALCON wrote: Medication: Oxycodone  HCl 10 MG TABS   Has the patient contacted their pharmacy? No, they tell them to call us  (Agent: If no, request that the patient contact the pharmacy for the refill. If patient does not wish to contact the pharmacy document the reason why and proceed with request.) (Agent: If yes, when and what did the pharmacy advise?)  This is the patient's preferred pharmacy:  [facility] [address], [place] - [address] [place] [address] Phone: [phone] Fax: [phone]  Is this the correct pharmacy for this prescription? Yes If no, delete pharmacy and type the correct one.   Has the prescription been filled recently? No  Is the patient out of the medication? No  Has the patient been seen for an appointment in the last year OR does the patient have an upcoming appointment? Yes  Can we respond through MyChart? Yes  Agent: Please be advised that Rx refills may take up to 3 business days. We ask that you follow-up with your pharmacy.

## 2024-07-15 DIAGNOSIS — R092 Respiratory arrest: Secondary | ICD-10-CM | POA: Diagnosis not present

## 2024-07-15 DIAGNOSIS — J441 Chronic obstructive pulmonary disease with (acute) exacerbation: Secondary | ICD-10-CM | POA: Diagnosis not present

## 2024-07-27 ENCOUNTER — Other Ambulatory Visit: Payer: Self-pay

## 2024-07-27 MED ORDER — ROSUVASTATIN CALCIUM 40 MG PO TABS: ORAL_TABLET | ORAL | 1 refills | Status: AC

## 2024-08-03 ENCOUNTER — Other Ambulatory Visit: Payer: Self-pay | Admitting: Family Medicine

## 2024-08-03 NOTE — Telephone Encounter (Unsigned)
 Copied from CRM #[id number]. Topic: Clinical - Medication Refill >> Aug 03, 2024  9:13 AM Robinson H wrote: Medication: pantoprazole  (PROTONIX ) 40 MG tablet, LORazepam  (ATIVAN ) 1 MG tablet Oxycodone  HCl 10 MG TABS  Has the patient contacted their pharmacy? No, new pharmacy for 2 rx's (Agent: If no, request that the patient contact the pharmacy for the refill. If patient does not wish to contact the pharmacy document the reason why and proceed with request.) (Agent: If yes, when and what did the pharmacy advise?)  This is the patient's preferred pharmacy:  [facility] [address] [place] - [address] [place] [address] Phone: [phone] Fax: [phone]  Is this the correct pharmacy for this prescription? Yes If no, delete pharmacy and type the correct one.   Has the prescription been filled recently? No  Is the patient out of the medication? Yes  Has the patient been seen for an appointment in the last year OR does the patient have an upcoming appointment? Yes  Can we respond through MyChart? Yes  Agent: Please be advised that Rx refills may take up to 3 business days. We ask that you follow-up with your pharmacy.

## 2024-08-04 ENCOUNTER — Other Ambulatory Visit: Payer: Self-pay

## 2024-08-04 MED ORDER — OXYCODONE HCL 10 MG PO TABS: ORAL_TABLET | ORAL | 0 refills | Status: DC

## 2024-08-04 MED ORDER — LISINOPRIL 5 MG PO TABS: ORAL_TABLET | ORAL | 0 refills | Status: DC

## 2024-08-04 MED ORDER — LORAZEPAM 1 MG PO TABS: ORAL_TABLET | ORAL | 5 refills | Status: AC

## 2024-08-04 MED ORDER — PANTOPRAZOLE SODIUM 40 MG PO TBEC: DELAYED_RELEASE_TABLET | ORAL | 1 refills | Status: AC

## 2024-08-04 NOTE — Telephone Encounter (Signed)
 3 Refills requested and sent to [facility] in [place] Last OV 7/10 Next OV 10/15

## 2024-08-09 ENCOUNTER — Other Ambulatory Visit: Payer: Self-pay | Admitting: Family Medicine

## 2024-08-14 DIAGNOSIS — R092 Respiratory arrest: Secondary | ICD-10-CM | POA: Diagnosis not present

## 2024-08-14 DIAGNOSIS — J441 Chronic obstructive pulmonary disease with (acute) exacerbation: Secondary | ICD-10-CM | POA: Diagnosis not present

## 2024-08-19 ENCOUNTER — Encounter: Payer: Self-pay | Admitting: Family Medicine

## 2024-08-19 ENCOUNTER — Ambulatory Visit: Admitting: Family Medicine

## 2024-08-19 VITALS — BP 116/70 | HR 84 | Temp 97.7°F | Ht 64.0 in | Wt 200.0 lb

## 2024-08-19 DIAGNOSIS — G8929 Other chronic pain: Secondary | ICD-10-CM

## 2024-08-19 DIAGNOSIS — M150 Primary generalized (osteo)arthritis: Secondary | ICD-10-CM | POA: Diagnosis not present

## 2024-08-19 DIAGNOSIS — M545 Low back pain, unspecified: Secondary | ICD-10-CM

## 2024-08-19 DIAGNOSIS — Z23 Encounter for immunization: Secondary | ICD-10-CM | POA: Diagnosis not present

## 2024-08-19 DIAGNOSIS — M533 Sacrococcygeal disorders, not elsewhere classified: Secondary | ICD-10-CM | POA: Diagnosis not present

## 2024-08-19 DIAGNOSIS — E119 Type 2 diabetes mellitus without complications: Secondary | ICD-10-CM | POA: Diagnosis not present

## 2024-08-19 DIAGNOSIS — G894 Chronic pain syndrome: Secondary | ICD-10-CM | POA: Diagnosis not present

## 2024-08-19 DIAGNOSIS — I10 Essential (primary) hypertension: Secondary | ICD-10-CM | POA: Diagnosis not present

## 2024-08-19 DIAGNOSIS — M25562 Pain in left knee: Secondary | ICD-10-CM | POA: Diagnosis not present

## 2024-08-19 DIAGNOSIS — M25561 Pain in right knee: Secondary | ICD-10-CM | POA: Diagnosis not present

## 2024-08-19 LAB — POCT GLYCOSYLATED HEMOGLOBIN (HGB A1C)
HbA1c POC (<> result, manual entry): 6 % (ref 4.0–5.6)
HbA1c, POC (controlled diabetic range): 6 % (ref 0.0–7.0)
HbA1c, POC (prediabetic range): 6 % (ref 5.7–6.4)
Hemoglobin A1C: 6 % — AB (ref 4.0–5.6)

## 2024-08-19 NOTE — Progress Notes (Signed)
 OFFICE VISIT  08/19/2024  CC:  Chief Complaint  Patient presents with   Medical Management of Chronic Issues    Patient is a 68 y.o. female who presents for 54-month follow-up chronic pain syndrome, hypertension, and diabetes. A/P as of last visit: 1 diabetes without complication. Doing great on diet alone. Hemoglobin A1c 5.8% today. Feet exam normal today. Urine microalbumin/creatinine today.   2. hypertension, well-controlled on lisinopril  5 mg daily. Electrolytes and creatinine today.   3.  chronic pain syndrome. Stable. Chronic bilat LBP w/out sciatica, and chronic pain from bilat osteoarthritis knees.  Also LBP and bilat shoulder pain.  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  She remains on Humira and is doing well on this.  She still takes 1-2 oxycodone  3 times a day and this sufficiently helps control her pain to allow for maximum functioning and quality of life.  NSAID GI intolerance.   INTERIM HX: Labs were all excellent last visit. She has had some additional pain in the last 24 hours--> right SI joint.  It radiates a little bit down her leg intermittently and feels like it spasms in the buttock area. She had this about a year ago and it lasted about a week.    Otherwise she feels like everything is stable. No home glucose or blood pressure monitoring.  PMP AWARE reviewed today: most recent rx for oxycodone  10 mg was filled 08/04/2024, # 90, rx by me.  Most recent lorazepam  1 mg prescription filled 08/04/2024, #60, prescription by me. No red flags.  ROS as above, plus--> no fevers, no CP, no SOB, no wheezing, no cough, no dizziness, no HAs, no rashes, no melena/hematochezia.  No polyuria or polydipsia.  No myalgias or arthralgias.  No focal weakness, paresthesias, or tremors.  No acute vision or hearing abnormalities.  No dysuria or unusual/new urinary urgency or frequency.  No recent changes in lower legs. No n/v/d or abd pain.  No palpitations.     Past Medical History:  Diagnosis Date   Allergy    Anxiety    Aortic valve stenosis    very mild   Atypical chest pain 2007; 2015   cardiolyte neg, echo nl, cath showed mild/nonobstructive LAD disease   Bilateral primary osteoarthritis of knee 06/30/2012   Right >L.  Right unicompartmental knee arthroplasty 10/25/18   CAD (coronary artery disease)    Chronic LBP    Multiple surgeries   COPD (chronic obstructive pulmonary disease) (HCC)    COVID-19 virus infection 06/02/2019   Eval at [facility] ED and d/c'd home.  Admitted 06/12/19 with hypoxic RF and bilat infiltrates.   DDD (degenerative disc disease)    spinal stenosis   Diabetes mellitus without complication (HCC)    04/2023 a1c 7.2%   Elevated liver enzymes    2021 after starting methotrexate. 2022->rheum d/c'd her methotrexate   Her abd u/s 12/05/20 showed fatty liver, o/w normal.   Fatty liver    11/2020 liver u/s   Gastritis    H pylori NEG on EGD 02/2021   GERD (gastroesophageal reflux disease)    History of adenomatous polyp of colon 06/2021   2022, recall 5 yrs   History of GI bleed    NSAIDS   Hyperlipidemia    mixed   Microscopic hematuria    full w/ u unrevealing X 2   Normal nuclear stress test 11/11 and 06/2014   negative, EF normal   Oxygen  deficiency    Just At night   Palpitations  2006   48H holter neg   Positive occult stool blood test    iFOB positive 04/2021->colonoscopy showed multiple adenomatous polyps and nonbleeding int hem.   RBBB (right bundle branch block)    Recurrent UTI    Seronegative rheumatoid arthritis (HCC) 10/2019   multiple sites->pred started, methotrex started 10/2019; rheum to add humira as of 03/2020   Tobacco dependence in remission    Quit fall 2015.      Past Surgical History:  Procedure Laterality Date   ABDOMINAL HYSTERECTOMY  1997   DUB   APPENDECTOMY  1984   BIOPSY  02/14/2021   Procedure: BIOPSY;  Surgeon: Cindie Carlin POUR, DO;  Location: [facility];  Service:  Endoscopy;;   CARDIAC CATHETERIZATION  10/09/2005   no CAD, mildly elevated LVEDP, normal LV function (Dr. FABIENE Hasten)   CARDIAC CATHETERIZATION N/A 05/16/2015   Mild non-obstructive CAD--med mgmt.  Procedure: Left Heart Cath and Coronary Angiography;  Surgeon: Vinie JAYSON Maxcy, MD;  Location: [facility] INVASIVE CV LAB;  Service: Cardiovascular;  Laterality: N/A;   CARDIOVASCULAR STRESS TEST  06/2014   normal lexiscan  NST   CARDIOVASCULAR STRESS TEST  2006   persantine - no ischemia, low risk    COLONOSCOPY W/ POLYPECTOMY  06/12/2021   multiple adenomas--recall 3-5 yrs   COLONOSCOPY WITH PROPOFOL  N/A 06/12/2021   Procedure: COLONOSCOPY WITH PROPOFOL ;  Surgeon: Cindie Carlin POUR, DO;  Location: [facility];  Service: Endoscopy;  Laterality: N/A;  8:30am   ESOPHAGOGASTRODUODENOSCOPY  02/14/2021   Gastritis ( H pylori NEG) w/out bleeding   ESOPHAGOGASTRODUODENOSCOPY (EGD) WITH PROPOFOL  N/A 02/14/2021   Procedure: ESOPHAGOGASTRODUODENOSCOPY (EGD) WITH PROPOFOL ;  Surgeon: Cindie Carlin POUR, DO;  Location: [facility];  Service: Endoscopy;  Laterality: N/A;  AM   JOINT REPLACEMENT  11-05-2017   Partial right knee replacement   KNEE ARTHROSCOPY Bilateral    left wrist ganglion cyst excision  2010   LUMBAR SPINE SURGERY  1993   right iliac crest bone graft+metal instrumentation; 2005 metal removal and decompression, 2011 lumbar decompression 4-11, then stabilization/ instrumentation done 09-19-10: L2,L3, L5 left and L2 , L3, L4 right pedical remnant L4 left embedded. Left iliac crest bone graft-- Dr Pascal [facility]  02/10/2016   Dr. Pascal: lumbar decompression, instrumentation removal, and fusion exploration--HELPED her a lot, esp her radicular leg pains.   OOPHORECTOMY Right    cyst   PARTIAL KNEE ARTHROPLASTY Right 11/04/2018   Procedure: UNICOMPARTMENTAL KNEE;  Surgeon: Beverley Evalene BIRCH, MD;  Location: [facility];  Service: Orthopedics;  Laterality: Right;  Adductor Block   POLYPECTOMY   06/12/2021   Procedure: POLYPECTOMY;  Surgeon: Cindie Carlin POUR, DO;  Location: [facility];  Service: Endoscopy;;   SPINE SURGERY  05-24-1991   Dr Pascal surgeon [facility] . 2 level spinal fusion. 09-22-1998, 06-21-2004 took plates and screws out also got bone off of nerves. 2012 took out plates and screws and got bones off of nerves. 2017 2 level spinal fusion and scraped bones away from nerves.   TONSILLECTOMY  68 yrs old   TOTAL KNEE ARTHROPLASTY Left 09/03/2023   Procedure: TOTAL KNEE ARTHROPLASTY;  Surgeon: Beverley Evalene BIRCH, MD;  Location: [facility];  Service: Orthopedics;  Laterality: Left;   TRANSTHORACIC ECHOCARDIOGRAM  2006   EF=>55%, trace MR, mild TR, trace AV regurg, trace pulm valve regurg    TUBAL LIGATION      Outpatient Medications Prior to Visit  Medication Sig Dispense Refill   OXYGEN   Inhale 2.5 L into the lungs at bedtime.     acetaminophen  (TYLENOL ) 500 MG tablet Take 500 mg by mouth every 6 (six) hours as needed for fever.     Adalimumab (HUMIRA) 40 MG/0.8ML PSKT Inject 40 mg into the skin every 14 (fourteen) days.     albuterol  (VENTOLIN  HFA) 108 (90 Base) MCG/ACT inhaler Inhale 2 puffs into the lungs every 4 (four) hours as needed for wheezing or shortness of breath (bronchitis). Reported on 03/12/2016 18 g 1   clopidogrel  (PLAVIX ) 75 MG tablet TAKE 1 TABLET(75 MG) BY MOUTH DAILY 30 tablet 0   cyclobenzaprine  (FLEXERIL ) 10 MG tablet Take 1 tablet (10 mg total) by mouth every 8 (eight) hours as needed for muscle spasms. 180 tablet 3   fenofibrate  160 MG tablet Take 1 tablet (160 mg total) by mouth daily. with food 90 tablet 3   lisinopril  (ZESTRIL ) 5 MG tablet TAKE 1 TABLET(5 MG) BY MOUTH DAILY 30 tablet 0   LORazepam  (ATIVAN ) 1 MG tablet TAKE 1 TABLET(1 MG) BY MOUTH TWICE DAILY 60 tablet 5   nitroGLYCERIN  (NITROSTAT ) 0.4 MG SL tablet Place 1 tablet (0.4 mg total) under the tongue every 5 (five) minutes as needed for chest pain (MAX 3 doses.). (Patient not taking: Reported on  08/19/2024) 10 tablet 1   Oxycodone  HCl 10 MG TABS Take 1/2 - 1 tablet by mouth every 6 hours as needed. 90 tablet 0   pantoprazole  (PROTONIX ) 40 MG tablet TAKE 1 TABLET(40 MG) BY MOUTH DAILY 90 tablet 1   rosuvastatin  (CRESTOR ) 40 MG tablet TAKE 1 TABLET(40 MG) BY MOUTH DAILY 90 tablet 1   Tiotropium Bromide-Olodaterol (STIOLTO RESPIMAT ) 2.5-2.5 MCG/ACT AERS Inhale 2 puffs into the lungs daily. 1 each 11   No facility-administered medications prior to visit.    Allergies  Allergen Reactions   Penicillins Anaphylaxis and Swelling    DID THE REACTION INVOLVE: Swelling of the face/tongue/throat, SOB, or low BP? Yes Sudden or severe rash/hives, skin peeling, or the inside of the mouth or nose? Yes Did it require medical treatment? Yes When did it last happen? 68 years old If all above answers are NO, may proceed with cephalosporin use.    Aspirin  Other (See Comments)     GI Bleed   Beta Adrenergic Blockers Other (See Comments)    REACTION: decreased libido   Citalopram  Nausea And Vomiting   Nsaids Other (See Comments)     GI Upset   Other Hives and Itching    Walnuts    Plaquenil [Hydroxychloroquine Sulfate] Other (See Comments)    Tachycardia   Prochlorperazine Edisylate Other (See Comments)     Stroke like symptoms   Sulfonamide Derivatives Other (See Comments)    Unknown allergic reaction   Amitriptyline Hcl Palpitations and Other (See Comments)     increased heart rate    Review of Systems As per HPI  PE:    08/19/2024    1:45 PM 05/14/2024    1:02 PM 02/13/2024    2:43 PM  Vitals with BMI  Height 5' 4 5' 4 5' 4  Weight 200 lbs 195 lbs 13 oz 193 lbs  BMI 34.31 33.59 33.11  Systolic 116 121 867  Diastolic 70 75 79  Pulse 84 94 84     Physical Exam  General: Alert and well-appearing. She has focal right SI joint tenderness to palpation.  LABS:  Last CBC Lab Results  Component Value Date   WBC 10.7 (H) 05/14/2024  HGB 13.8 05/14/2024   HCT 41.9  05/14/2024   MCV 91.1 05/14/2024   MCH 31.2 08/22/2023   RDW 14.3 05/14/2024   PLT 172.0 05/14/2024   Last metabolic panel Lab Results  Component Value Date   GLUCOSE 95 05/14/2024   NA 141 05/14/2024   K 4.1 05/14/2024   CL 101 05/14/2024   CO2 33 (H) 05/14/2024   BUN 19 05/14/2024   CREATININE 0.79 05/14/2024   GFR 77.12 05/14/2024   CALCIUM  10.5 05/14/2024   PHOS 3.8 07/19/2022   PROT 7.5 05/14/2024   ALBUMIN 4.8 05/14/2024   LABGLOB 2.5 01/05/2021   AGRATIO 1.8 01/05/2021   BILITOT 0.7 05/14/2024   ALKPHOS 53 05/14/2024   AST 28 05/14/2024   ALT 24 05/14/2024   ANIONGAP 11 08/22/2023   Last lipids Lab Results  Component Value Date   CHOL 141 05/14/2024   HDL 52.20 05/14/2024   LDLCALC 63 05/14/2024   LDLDIRECT 172.2 07/14/2014   TRIG 131.0 05/14/2024   CHOLHDL 3 05/14/2024   Last hemoglobin A1c Lab Results  Component Value Date   HGBA1C 6.0 (A) 08/19/2024   HGBA1C 6.0 08/19/2024   HGBA1C 6.0 08/19/2024   HGBA1C 6.0 08/19/2024   Last thyroid  functions Lab Results  Component Value Date   TSH 0.42 09/24/2019   IMPRESSION AND PLAN:  1 diabetes without complication. Doing great on diet alone. Hemoglobin A1c 6.0% today.    2. hypertension, well-controlled on lisinopril  5 mg daily. Have been consistently normal, most recently 3 months ago. She gets full panel of labs next month when she sees her rheumatologist. We deferred labs today.   3.  chronic pain syndrome. Stable other than some recent onset of right SI joint pain.  Her muscle relaxer seems to help this pretty well.  She can use Flexeril  10 mg 3 times daily as needed.  She will let me know if this does not significantly improve in the next 1 week.  Chronic bilat LBP w/out sciatica, and chronic pain from bilat osteoarthritis knees.  Also LBP and bilat shoulder pain.  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  She remains on Humira and is doing well on this.  She still takes  1-2 oxycodone  3 times a day and this sufficiently helps control her pain to allow for maximum functioning and quality of life.  NSAID GI intolerance. Updated controlled substance contract today. Urine drug screen at next visit in 3 months.  An After Visit Summary was printed and given to the patient.  FOLLOW UP: Return in about 3 months (around 11/19/2024) for routine chronic illness f/u. Next CPE July 2026 Signed:  Gerlene Hockey, MD           08/19/2024

## 2024-08-19 NOTE — Patient Instructions (Signed)

## 2024-08-29 ENCOUNTER — Other Ambulatory Visit: Payer: Self-pay | Admitting: Family Medicine

## 2024-09-03 ENCOUNTER — Other Ambulatory Visit: Payer: Self-pay | Admitting: Family Medicine

## 2024-09-03 NOTE — Telephone Encounter (Unsigned)
 Copied from CRM #[id number]. Topic: Clinical - Medication Refill >> Sep 03, 2024 12:29 PM Brittany M wrote: Medication: Oxycodone  HCl 10 MG TABS  Has the patient contacted their pharmacy? Yes (Agent: If no, request that the patient contact the pharmacy for the refill. If patient does not wish to contact the pharmacy document the reason why and proceed with request.) (Agent: If yes, when and what did the pharmacy advise?)  This is the patient's preferred pharmacy:  [facility] [address], [place] - [address] [place] [address] Phone: [phone] Fax: [phone]  Is this the correct pharmacy for this prescription? Yes If no, delete pharmacy and type the correct one.   Has the prescription been filled recently? Yes  Is the patient out of the medication? Yes  Has the patient been seen for an appointment in the last year OR does the patient have an upcoming appointment? Yes  Can we respond through MyChart? Yes  Agent: Please be advised that Rx refills may take up to 3 business days. We ask that you follow-up with your pharmacy.

## 2024-09-04 MED ORDER — OXYCODONE HCL 10 MG PO TABS: ORAL_TABLET | ORAL | 0 refills | Status: DC

## 2024-09-14 DIAGNOSIS — R092 Respiratory arrest: Secondary | ICD-10-CM | POA: Diagnosis not present

## 2024-09-14 DIAGNOSIS — J441 Chronic obstructive pulmonary disease with (acute) exacerbation: Secondary | ICD-10-CM | POA: Diagnosis not present

## 2024-10-05 ENCOUNTER — Telehealth: Payer: Self-pay | Admitting: Family Medicine

## 2024-10-05 NOTE — Telephone Encounter (Unsigned)
 Copied from CRM #[id number]. Topic: Clinical - Medication Refill >> Oct 05, 2024  9:47 AM Ashley R wrote: Medication: Oxycodone  HCl 10 MG TABS  Has the patient contacted their pharmacy? Yes   This is the patient's preferred pharmacy:  [facility] [address], [place] - [address] [place] [address] Phone: [phone] Fax: [phone]  Is this the correct pharmacy for this prescription? Yes If no, delete pharmacy and type the correct one.   Has the prescription been filled recently? Yes  Is the patient out of the medication? Yes  Has the patient been seen for an appointment in the last year OR does the patient have an upcoming appointment? Yes  Can we respond through MyChart? Yes  Agent: Please be advised that Rx refills may take up to 3 business days. We ask that you follow-up with your pharmacy.

## 2024-10-06 MED ORDER — OXYCODONE HCL 10 MG PO TABS: ORAL_TABLET | ORAL | 0 refills | Status: DC

## 2024-10-20 LAB — CBC: RBC: 4.29 (ref 3.87–5.11)

## 2024-10-20 LAB — BASIC METABOLIC PANEL WITH GFR
BUN: 19 (ref 4–21)
CO2: 25 — AB (ref 13–22)
Chloride: 103 (ref 99–108)
Creatinine: 0.8 (ref 0.5–1.1)
Glucose: 107
Potassium: 4.2 meq/L (ref 3.5–5.1)
Sodium: 143 (ref 137–147)

## 2024-10-20 LAB — VITAMIN D 25 HYDROXY (VIT D DEFICIENCY, FRACTURES): Vit D, 25-Hydroxy: 41.7

## 2024-10-20 LAB — COMPREHENSIVE METABOLIC PANEL WITH GFR
Albumin: 4.5 (ref 3.5–5.0)
Calcium: 10.3 (ref 8.7–10.7)
Globulin: 2.4

## 2024-10-20 LAB — CBC AND DIFFERENTIAL
HCT: 40 (ref 36–46)
Hemoglobin: 13.4 (ref 12.0–16.0)
Neutrophils Absolute: 6.4
Platelets: 136 K/uL — AB (ref 150–400)
WBC: 11.2

## 2024-10-20 LAB — HEPATIC FUNCTION PANEL
ALT: 21 U/L (ref 7–35)
AST: 28 (ref 13–35)
Alkaline Phosphatase: 38 (ref 25–125)
Bilirubin, Total: 0.5

## 2024-10-23 ENCOUNTER — Other Ambulatory Visit: Payer: Self-pay

## 2024-10-23 NOTE — Telephone Encounter (Signed)
 She does not take this anymore to my knowledge.  Please confirm with patient.

## 2024-10-23 NOTE — Telephone Encounter (Signed)
 RF request for metformin  LOV: 08/19/24 Next ov: 11/18/24 Last written: 04/28/24 (180,1)  Please fill, if appropriate. Rx pending, not currently listed on meds

## 2024-10-26 NOTE — Telephone Encounter (Signed)
 Pt confirmed she is not taking.

## 2024-11-04 ENCOUNTER — Other Ambulatory Visit: Payer: Self-pay | Admitting: Family Medicine

## 2024-11-04 NOTE — Telephone Encounter (Signed)
 Copied from CRM #[id number]. Topic: Clinical - Medication Refill >> Nov 04, 2024  2:05 PM Brittany M wrote: Medication: fenofibrate  160 MG tablet  Oxycodone  HCl 10 MG TABS  Has the patient contacted their pharmacy? Yes (Agent: If no, request that the patient contact the pharmacy for the refill. If patient does not wish to contact the pharmacy document the reason why and proceed with request.) (Agent: If yes, when and what did the pharmacy advise?)  This is the patient's preferred pharmacy:  [facility] [address], [place] - [address] [place] [address] Phone: [phone] Fax: [phone]  Is this the correct pharmacy for this prescription? Yes If no, delete pharmacy and type the correct one.   Has the prescription been filled recently? Yes  Is the patient out of the medication? Yes  Has the patient been seen for an appointment in the last year OR does the patient have an upcoming appointment? Yes  Can we respond through MyChart? Yes  Agent: Please be advised that Rx refills may take up to 3 business days. We ask that you follow-up with your pharmacy.

## 2024-11-07 MED ORDER — FENOFIBRATE 160 MG PO TABS: 160.0000 mg | ORAL_TABLET | Freq: Every day | ORAL | 3 refills | Status: AC

## 2024-11-07 MED ORDER — OXYCODONE HCL 10 MG PO TABS: ORAL_TABLET | ORAL | 0 refills | Status: DC

## 2024-11-18 ENCOUNTER — Ambulatory Visit: Admitting: Family Medicine

## 2024-11-18 ENCOUNTER — Encounter: Payer: Self-pay | Admitting: Family Medicine

## 2024-11-18 VITALS — BP 116/74 | HR 88 | Temp 98.3°F | Ht 64.0 in | Wt 205.6 lb

## 2024-11-18 DIAGNOSIS — E119 Type 2 diabetes mellitus without complications: Secondary | ICD-10-CM

## 2024-11-18 DIAGNOSIS — F411 Generalized anxiety disorder: Secondary | ICD-10-CM | POA: Diagnosis not present

## 2024-11-18 DIAGNOSIS — G894 Chronic pain syndrome: Secondary | ICD-10-CM

## 2024-11-18 DIAGNOSIS — I10 Essential (primary) hypertension: Secondary | ICD-10-CM | POA: Diagnosis not present

## 2024-11-18 DIAGNOSIS — M545 Low back pain, unspecified: Secondary | ICD-10-CM

## 2024-11-18 DIAGNOSIS — M25561 Pain in right knee: Secondary | ICD-10-CM

## 2024-11-18 DIAGNOSIS — Z79899 Other long term (current) drug therapy: Secondary | ICD-10-CM

## 2024-11-18 DIAGNOSIS — M79641 Pain in right hand: Secondary | ICD-10-CM

## 2024-11-18 DIAGNOSIS — M150 Primary generalized (osteo)arthritis: Secondary | ICD-10-CM | POA: Diagnosis not present

## 2024-11-18 DIAGNOSIS — M25562 Pain in left knee: Secondary | ICD-10-CM

## 2024-11-18 DIAGNOSIS — G8929 Other chronic pain: Secondary | ICD-10-CM

## 2024-11-18 DIAGNOSIS — M79642 Pain in left hand: Secondary | ICD-10-CM | POA: Diagnosis not present

## 2024-11-18 LAB — POCT GLYCOSYLATED HEMOGLOBIN (HGB A1C)
HbA1c POC (<> result, manual entry): 6.2 %
HbA1c, POC (controlled diabetic range): 6.2 % (ref 0.0–7.0)
HbA1c, POC (prediabetic range): 6.2 % (ref 5.7–6.4)
Hemoglobin A1C: 6.2 % — AB (ref 4.0–5.6)

## 2024-11-18 NOTE — Patient Instructions (Signed)
"  ° °  Your A1c was 6.2 today  It was very nice to see you today!   PLEASE NOTE:  If labs were collected or images ordered, we will inform you of  results once we have received them and reviewed. We will contact you either by echart message, or telephone call.  Please give ample time to the testing facility, and our office to run,  receive and review results. Please do not call inquiring of results, even if you can see them in your chart. We will contact you as soon as we are able. If it has been over 1 week since the test was completed, and you have not yet heard from us , then please call us .     - echart message- for normal results that have been seen by the patient already.   - telephone call: abnormal results or if patient has not viewed results in their echart.   If a referral to a specialist was entered for you, please call us  in 2 weeks if you have not heard from the specialist office to schedule.     "

## 2024-11-18 NOTE — Progress Notes (Signed)
 OFFICE VISIT  11/18/2024  CC:  Chief Complaint  Patient presents with   Medical Management of Chronic Issues    Patient is a 69 y.o. female who presents for 61-month follow-up diabetes, hypertension, chronic pain syndrome, and anxiety. A/P as of last visit: 1 diabetes without complication. Doing great on diet alone. Hemoglobin A1c 6.0% today.   2. hypertension, well-controlled on lisinopril  5 mg daily. Have been consistently normal, most recently 3 months ago. She gets full panel of labs next month when she sees her rheumatologist. We deferred labs today.   3.  chronic pain syndrome. Stable other than some recent onset of right SI joint pain.  Her muscle relaxer seems to help this pretty well.  She can use Flexeril  10 mg 3 times daily as needed.  She will let me know if this does not significantly improve in the next 1 week. Chronic bilat LBP w/out sciatica, and chronic pain from bilat osteoarthritis knees.  Also LBP and bilat shoulder pain.  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  She remains on Humira and is doing well on this.  She still takes 1-2 oxycodone  3 times a day and this sufficiently helps control her pain to allow for maximum functioning and quality of life.  NSAID GI intolerance. Updated controlled substance contract today. Urine drug screen at next visit in 3 months.  INTERIM HX: Brenda [name] is well. No acute concerns. Diet has been not quite as good the last several months during the holidays.  Her pain is relatively well-controlled on current regimen. Indication for chronic opioid: chronic bilat LBP w/out sciatica, and chronic pain from bilat osteoarthritis knees.  Also LBP and bilat shoulder pain.  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  She remains on Humira and is doing well on this.  She still takes 1-2 oxycodone  3 times a day and this sufficiently helps control her pain to allow for maximum functioning and quality of  life.  NSAID GI intolerance.   PMP AWARE reviewed today: most recent rx for oxycodone  10 mg was filled 11/07/2024, # 90, rx by me.  Most recent lorazepam  1 mg prescription was filled 11/04/2024, #60, prescription by me. No red flags.  ROS --> no fevers, no CP, no SOB, no wheezing, no cough, no dizziness, no HAs, no rashes, no melena/hematochezia.  No polyuria or polydipsia.  No focal weakness, paresthesias, or tremors.  No acute vision or hearing abnormalities.  No dysuria or unusual/new urinary urgency or frequency.  No recent changes in lower legs. No n/v/d or abd pain.  No palpitations.    Past Medical History:  Diagnosis Date   Allergy    Anxiety    Aortic valve stenosis    very mild   Atypical chest pain 2007; 2015   cardiolyte neg, echo nl, cath showed mild/nonobstructive LAD disease   Bilateral primary osteoarthritis of knee 06/30/2012   Right >L.  Right unicompartmental knee arthroplasty 10/25/18   CAD (coronary artery disease)    Chronic LBP    Multiple surgeries   COPD (chronic obstructive pulmonary disease) (HCC)    COVID-19 virus infection 06/02/2019   Eval at [facility] ED and d/c'd home.  Admitted 06/12/19 with hypoxic RF and bilat infiltrates.   DDD (degenerative disc disease)    spinal stenosis   Diabetes mellitus without complication (HCC)    04/2023 a1c 7.2%   Elevated liver enzymes    2021 after starting methotrexate. 2022->rheum d/c'd her methotrexate   Her abd u/s 12/05/20 showed  fatty liver, o/w normal.   Fatty liver    11/2020 liver u/s   Gastritis    H pylori NEG on EGD 02/2021   GERD (gastroesophageal reflux disease)    History of adenomatous polyp of colon 06/2021   2022, recall 5 yrs   History of GI bleed    NSAIDS   Hyperlipidemia    mixed   Hypertension    Microscopic hematuria    full w/ u unrevealing X 2   Normal nuclear stress test 11/11 and 06/2014   negative, EF normal   Oxygen  deficiency    Just At night   Palpitations 2006   48H holter neg    Positive occult stool blood test    iFOB positive 04/2021->colonoscopy showed multiple adenomatous polyps and nonbleeding int hem.   RBBB (right bundle branch block)    Recurrent UTI    Seronegative rheumatoid arthritis (HCC) 10/2019   multiple sites->pred started, methotrex started 10/2019; rheum to add humira as of 03/2020   Tobacco dependence in remission    Quit fall 2015.      Past Surgical History:  Procedure Laterality Date   ABDOMINAL HYSTERECTOMY  1997   DUB   APPENDECTOMY  1984   BIOPSY  02/14/2021   Procedure: BIOPSY;  Surgeon: Cindie Carlin POUR, DO;  Location: [facility];  Service: Endoscopy;;   CARDIAC CATHETERIZATION  10/09/2005   no CAD, mildly elevated LVEDP, normal LV function (Dr. FABIENE Hasten)   CARDIAC CATHETERIZATION N/A 05/16/2015   Mild non-obstructive CAD--med mgmt.  Procedure: Left Heart Cath and Coronary Angiography;  Surgeon: Vinie JAYSON Maxcy, MD;  Location: [facility] INVASIVE CV LAB;  Service: Cardiovascular;  Laterality: N/A;   CARDIOVASCULAR STRESS TEST  06/2014   normal lexiscan  NST   CARDIOVASCULAR STRESS TEST  2006   persantine - no ischemia, low risk    COLONOSCOPY W/ POLYPECTOMY  06/12/2021   multiple adenomas--recall 3-5 yrs   COLONOSCOPY WITH PROPOFOL  N/A 06/12/2021   Procedure: COLONOSCOPY WITH PROPOFOL ;  Surgeon: Cindie Carlin POUR, DO;  Location: [facility];  Service: Endoscopy;  Laterality: N/A;  8:30am   ESOPHAGOGASTRODUODENOSCOPY  02/14/2021   Gastritis ( H pylori NEG) w/out bleeding   ESOPHAGOGASTRODUODENOSCOPY (EGD) WITH PROPOFOL  N/A 02/14/2021   Procedure: ESOPHAGOGASTRODUODENOSCOPY (EGD) WITH PROPOFOL ;  Surgeon: Cindie Carlin POUR, DO;  Location: [facility];  Service: Endoscopy;  Laterality: N/A;  AM   JOINT REPLACEMENT  11-05-2017   Partial right knee replacement   KNEE ARTHROSCOPY Bilateral    left wrist ganglion cyst excision  2010   LUMBAR SPINE SURGERY  1993   right iliac crest bone graft+metal instrumentation; 2005 metal removal and  decompression, 2011 lumbar decompression 4-11, then stabilization/ instrumentation done 09-19-10: L2,L3, L5 left and L2 , L3, L4 right pedical remnant L4 left embedded. Left iliac crest bone graft-- Dr Pascal [facility]  02/10/2016   Dr. Pascal: lumbar decompression, instrumentation removal, and fusion exploration--HELPED her a lot, esp her radicular leg pains.   OOPHORECTOMY Right    cyst   PARTIAL KNEE ARTHROPLASTY Right 11/04/2018   Procedure: UNICOMPARTMENTAL KNEE;  Surgeon: Beverley Evalene BIRCH, MD;  Location: [facility];  Service: Orthopedics;  Laterality: Right;  Adductor Block   POLYPECTOMY  06/12/2021   Procedure: POLYPECTOMY;  Surgeon: Cindie Carlin POUR, DO;  Location: [facility];  Service: Endoscopy;;   SPINE SURGERY  05-24-1991   Dr Pascal surgeon [facility] . 2 level spinal fusion. 09-22-1998, 06-21-2004 took plates and screws  out also got bone off of nerves. 2012 took out plates and screws and got bones off of nerves. 2017 2 level spinal fusion and scraped bones away from nerves.   TONSILLECTOMY  69 yrs old   TOTAL KNEE ARTHROPLASTY Left 09/03/2023   Procedure: TOTAL KNEE ARTHROPLASTY;  Surgeon: Beverley Evalene BIRCH, MD;  Location: [facility];  Service: Orthopedics;  Laterality: Left;   TRANSTHORACIC ECHOCARDIOGRAM  2006   EF=>55%, trace MR, mild TR, trace AV regurg, trace pulm valve regurg    TUBAL LIGATION      Outpatient Medications Prior to Visit  Medication Sig Dispense Refill   acetaminophen  (TYLENOL ) 500 MG tablet Take 500 mg by mouth every 6 (six) hours as needed for fever.     Adalimumab (HUMIRA) 40 MG/0.8ML PSKT Inject 40 mg into the skin every 14 (fourteen) days.     albuterol  (VENTOLIN  HFA) 108 (90 Base) MCG/ACT inhaler Inhale 2 puffs into the lungs every 4 (four) hours as needed for wheezing or shortness of breath (bronchitis). Reported on 03/12/2016 18 g 1   clopidogrel  (PLAVIX ) 75 MG tablet TAKE 1 TABLET(75 MG) BY MOUTH DAILY 30 tablet 0   cyclobenzaprine  (FLEXERIL ) 10  MG tablet Take 1 tablet (10 mg total) by mouth every 8 (eight) hours as needed for muscle spasms. 180 tablet 3   fenofibrate  160 MG tablet Take 1 tablet (160 mg total) by mouth daily. with food 90 tablet 3   lisinopril  (ZESTRIL ) 5 MG tablet Take 1 tablet by mouth once daily 90 tablet 0   LORazepam  (ATIVAN ) 1 MG tablet TAKE 1 TABLET(1 MG) BY MOUTH TWICE DAILY 60 tablet 5   nitroGLYCERIN  (NITROSTAT ) 0.4 MG SL tablet Place 1 tablet (0.4 mg total) under the tongue every 5 (five) minutes as needed for chest pain (MAX 3 doses.). 10 tablet 1   Oxycodone  HCl 10 MG TABS Take 1/2 - 1 tablet by mouth every 6 hours as needed. 90 tablet 0   OXYGEN  Inhale 2.5 L into the lungs at bedtime.     pantoprazole  (PROTONIX ) 40 MG tablet TAKE 1 TABLET(40 MG) BY MOUTH DAILY 90 tablet 1   rosuvastatin  (CRESTOR ) 40 MG tablet TAKE 1 TABLET(40 MG) BY MOUTH DAILY 90 tablet 1   Tiotropium Bromide-Olodaterol (STIOLTO RESPIMAT ) 2.5-2.5 MCG/ACT AERS Inhale 2 puffs into the lungs daily. 1 each 11   No facility-administered medications prior to visit.    Allergies[1]  Review of Systems As per HPI  PE:    11/18/2024    1:44 PM 08/19/2024    1:45 PM 05/14/2024    1:02 PM  Vitals with BMI  Height 5' 4 5' 4 5' 4  Weight 205 lbs 10 oz 200 lbs 195 lbs 13 oz  BMI 35.27 34.31 33.59  Systolic 116 116 878  Diastolic 74 70 75  Pulse 88 84 94     Physical Exam  Gen: Alert, well appearing.  Patient is oriented to person, place, time, and situation. CV: RRR, no m/r/g.   LUNGS: CTA bilat, nonlabored resps, good aeration in all lung fields. EXT: no clubbing or cyanosis.  no edema.   LABS:   Lab Results  Component Value Date   WBC 11.2 10/20/2024   HGB 13.4 10/20/2024   HCT 40 10/20/2024   MCV 91.1 05/14/2024   PLT 136 (A) 10/20/2024   Last metabolic panel Lab Results  Component Value Date   GLUCOSE 95 05/14/2024   NA 143 10/20/2024   K 4.2 10/20/2024   CL  103 10/20/2024   CO2 25 (A) 10/20/2024   BUN 19  10/20/2024   CREATININE 0.8 10/20/2024   GFR 77.12 05/14/2024   CALCIUM  10.3 10/20/2024   PHOS 3.8 07/19/2022   PROT 7.5 05/14/2024   ALBUMIN 4.5 10/20/2024   LABGLOB 2.5 01/05/2021   AGRATIO 1.8 01/05/2021   BILITOT 0.7 05/14/2024   ALKPHOS 38 10/20/2024   AST 28 10/20/2024   ALT 21 10/20/2024   ANIONGAP 11 08/22/2023   Last lipids Lab Results  Component Value Date   CHOL 141 05/14/2024   HDL 52.20 05/14/2024   LDLCALC 63 05/14/2024   LDLDIRECT 172.2 07/14/2014   TRIG 131.0 05/14/2024   CHOLHDL 3 05/14/2024   Last hemoglobin A1c Lab Results  Component Value Date   HGBA1C 6.2 (A) 11/18/2024   HGBA1C 6.2 11/18/2024   HGBA1C 6.2 11/18/2024   HGBA1C 6.2 11/18/2024   Last thyroid  functions Lab Results  Component Value Date   TSH 0.42 09/24/2019   IMPRESSION AND PLAN:  1 diabetes without complication. Doing great on diet alone. Hemoglobin is stable at A1c 6.2% today.   2. hypertension, well-controlled on lisinopril  5 mg daily. Electrolytes and creatinine have been consistently normal, most recently 10/19/2024 (rheumatologist).   3.  chronic pain syndrome. Stable  chronic bilat LBP w/out sciatica, and chronic pain from bilat osteoarthritis knees.  Also bilat shoulder pain.  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  She remains on Humira and is doing well on this.  She still takes 1-2 oxycodone  3 times a day and this sufficiently helps control her pain to allow for maximum functioning and quality of life.  NSAID GI intolerance. Updated controlled substance contract today. Urine drug screen at next visit in 3 months.  #4 GAD, doing well long-term on lorazepam  1 mg twice daily. UDS at next follow-up visit in 3 months.  An After Visit Summary was printed and given to the patient.  FOLLOW UP: Return in about 3 months (around 02/16/2025) for routine chronic illness f/u. Next CPE July 2026 Signed:  Gerlene Hockey, MD           11/18/2024      [1]   Allergies Allergen Reactions   Penicillins Anaphylaxis and Swelling    DID THE REACTION INVOLVE: Swelling of the face/tongue/throat, SOB, or low BP? Yes Sudden or severe rash/hives, skin peeling, or the inside of the mouth or nose? Yes Did it require medical treatment? Yes When did it last happen? 69 years old If all above answers are NO, may proceed with cephalosporin use.    Aspirin  Other (See Comments)     GI Bleed   Beta Adrenergic Blockers Other (See Comments)    REACTION: decreased libido   Citalopram  Nausea And Vomiting   Nsaids Other (See Comments)     GI Upset   Other Hives and Itching    Walnuts    Plaquenil [Hydroxychloroquine Sulfate] Other (See Comments)    Tachycardia   Prochlorperazine Edisylate Other (See Comments)     Stroke like symptoms   Sulfonamide Derivatives Other (See Comments)    Unknown allergic reaction   Amitriptyline Hcl Palpitations and Other (See Comments)     increased heart rate

## 2024-11-26 ENCOUNTER — Other Ambulatory Visit: Payer: Self-pay | Admitting: Family Medicine

## 2024-12-03 ENCOUNTER — Other Ambulatory Visit: Payer: Self-pay

## 2024-12-03 MED ORDER — OXYCODONE HCL 10 MG PO TABS: ORAL_TABLET | ORAL | 0 refills | Status: AC

## 2024-12-03 NOTE — Telephone Encounter (Signed)
 Requesting: oxycodone  Contract: 08/19/24 UDS: 08/01/23 Last Visit: 11/18/24 Next Visit: 02/17/25 Last Refill: 11/07/24 (90,0)  Please Advise. Rx pending  Pt also requested refills for Lorazepam  but refills still remaining

## 2024-12-07 ENCOUNTER — Telehealth: Payer: Self-pay

## 2024-12-07 ENCOUNTER — Other Ambulatory Visit (HOSPITAL_COMMUNITY): Payer: Self-pay

## 2024-12-07 NOTE — Telephone Encounter (Signed)
 Pharmacy Patient Advocate Encounter   Received notification from [facility] KEY that prior authorization for LORazepam  1MG  tablets  is required/requested.   Insurance verification completed.   The patient is insured through [employer].   Per test claim: PA required; PA submitted to above mentioned insurance via Latent Key/confirmation #/EOC [id number] Status is pending

## 2024-12-08 ENCOUNTER — Other Ambulatory Visit (HOSPITAL_COMMUNITY): Payer: Self-pay

## 2024-12-08 NOTE — Telephone Encounter (Signed)
 Pharmacy Patient Advocate Encounter  Received notification from [employer] that Prior Authorization for LORazepam  1MG  tablets  has been APPROVED from 12/07/2024 to 11/04/2025   PA Case: [id number], Status: Approved, Coverage Starts on: 02/02/202612:00:00 AM, Coverage Ends on: 11/04/2025 12:00:00 AM. Questions? Contact [phone]. Authorization Expiration12/31/2026   Nat FREDRIK [facility] Patient Advocate [phone]

## 2025-02-17 ENCOUNTER — Ambulatory Visit: Admitting: Family Medicine
